# Patient Record
Sex: Female | Born: 1940 | Race: White | Hispanic: No | Marital: Married | Smoking: Former smoker
Health system: Southern US, Community
[De-identification: ages and names within clinical notes are randomized; demographics above are authoritative.]

## PROBLEM LIST (undated history)

## (undated) DIAGNOSIS — I219 Acute myocardial infarction, unspecified: Secondary | ICD-10-CM

## (undated) DIAGNOSIS — I639 Cerebral infarction, unspecified: Secondary | ICD-10-CM

## (undated) DIAGNOSIS — K219 Gastro-esophageal reflux disease without esophagitis: Secondary | ICD-10-CM

## (undated) DIAGNOSIS — E119 Type 2 diabetes mellitus without complications: Secondary | ICD-10-CM

## (undated) DIAGNOSIS — Z9289 Personal history of other medical treatment: Secondary | ICD-10-CM

## (undated) DIAGNOSIS — N183 Chronic kidney disease, stage 3 unspecified: Secondary | ICD-10-CM

## (undated) DIAGNOSIS — D649 Anemia, unspecified: Secondary | ICD-10-CM

## (undated) DIAGNOSIS — I5020 Unspecified systolic (congestive) heart failure: Secondary | ICD-10-CM

## (undated) DIAGNOSIS — E785 Hyperlipidemia, unspecified: Secondary | ICD-10-CM

## (undated) DIAGNOSIS — E079 Disorder of thyroid, unspecified: Secondary | ICD-10-CM

## (undated) DIAGNOSIS — I110 Hypertensive heart disease with heart failure: Secondary | ICD-10-CM

## (undated) DIAGNOSIS — I4891 Unspecified atrial fibrillation: Secondary | ICD-10-CM

## (undated) DIAGNOSIS — K5790 Diverticulosis of intestine, part unspecified, without perforation or abscess without bleeding: Secondary | ICD-10-CM

## (undated) DIAGNOSIS — I5022 Chronic systolic (congestive) heart failure: Secondary | ICD-10-CM

## (undated) DIAGNOSIS — Z9989 Dependence on other enabling machines and devices: Secondary | ICD-10-CM

## (undated) DIAGNOSIS — G9341 Metabolic encephalopathy: Secondary | ICD-10-CM

## (undated) DIAGNOSIS — G4733 Obstructive sleep apnea (adult) (pediatric): Secondary | ICD-10-CM

## (undated) DIAGNOSIS — I5023 Acute on chronic systolic (congestive) heart failure: Secondary | ICD-10-CM

## (undated) HISTORY — DX: Hypertensive heart disease with heart failure: I11.0

## (undated) HISTORY — DX: Obstructive sleep apnea (adult) (pediatric): G47.33

## (undated) HISTORY — DX: Diverticulosis of intestine, part unspecified, without perforation or abscess without bleeding: K57.90

## (undated) HISTORY — DX: Acute on chronic systolic (congestive) heart failure: I50.23

## (undated) HISTORY — PX: EXCISIONAL HEMORRHOIDECTOMY: SHX1541

## (undated) HISTORY — PX: TEE WITH CARDIOVERSION: SHX5442

## (undated) HISTORY — DX: Personal history of other medical treatment: Z92.89

## (undated) HISTORY — DX: Anemia, unspecified: D64.9

## (undated) HISTORY — DX: Hyperlipidemia, unspecified: E78.5

## (undated) HISTORY — DX: Disorder of thyroid, unspecified: E07.9

## (undated) HISTORY — DX: Unspecified atrial fibrillation: I48.91

## (undated) HISTORY — DX: Chronic kidney disease, stage 3 unspecified: N18.30

## (undated) HISTORY — PX: CARDIAC CATHETERIZATION: SHX172

## (undated) HISTORY — DX: Cerebral infarction, unspecified: I63.9

## (undated) HISTORY — DX: Acute myocardial infarction, unspecified: I21.9

## (undated) HISTORY — DX: Chronic kidney disease, stage 3 (moderate): N18.3

## (undated) HISTORY — DX: Unspecified systolic (congestive) heart failure: I50.20

## (undated) HISTORY — DX: Chronic systolic (congestive) heart failure: I50.22

## (undated) HISTORY — DX: Dependence on other enabling machines and devices: Z99.89

## (undated) HISTORY — PX: NOSE SURGERY: SHX723

## (undated) HISTORY — DX: Type 2 diabetes mellitus without complications: E11.9

## (undated) HISTORY — DX: Gastro-esophageal reflux disease without esophagitis: K21.9

## (undated) HISTORY — DX: Metabolic encephalopathy: G93.41

---

## 2014-09-17 DIAGNOSIS — I4891 Unspecified atrial fibrillation: Secondary | ICD-10-CM | POA: Insufficient documentation

## 2014-09-17 DIAGNOSIS — G4733 Obstructive sleep apnea (adult) (pediatric): Secondary | ICD-10-CM

## 2014-09-17 DIAGNOSIS — R001 Bradycardia, unspecified: Secondary | ICD-10-CM

## 2014-09-17 DIAGNOSIS — E785 Hyperlipidemia, unspecified: Secondary | ICD-10-CM | POA: Insufficient documentation

## 2014-09-17 HISTORY — DX: Bradycardia, unspecified: R00.1

## 2014-09-17 HISTORY — DX: Hyperlipidemia, unspecified: E78.5

## 2014-09-17 HISTORY — DX: Obstructive sleep apnea (adult) (pediatric): G47.33

## 2014-11-21 ENCOUNTER — Encounter: Payer: Self-pay | Admitting: Gastroenterology

## 2014-11-21 HISTORY — PX: COLONOSCOPY: SHX174

## 2015-03-14 DIAGNOSIS — J9811 Atelectasis: Secondary | ICD-10-CM | POA: Diagnosis not present

## 2015-03-14 DIAGNOSIS — E119 Type 2 diabetes mellitus without complications: Secondary | ICD-10-CM | POA: Diagnosis not present

## 2015-03-14 DIAGNOSIS — J90 Pleural effusion, not elsewhere classified: Secondary | ICD-10-CM | POA: Insufficient documentation

## 2015-03-14 DIAGNOSIS — I10 Essential (primary) hypertension: Secondary | ICD-10-CM | POA: Diagnosis not present

## 2015-03-14 DIAGNOSIS — I480 Paroxysmal atrial fibrillation: Secondary | ICD-10-CM | POA: Diagnosis not present

## 2015-03-14 DIAGNOSIS — G4733 Obstructive sleep apnea (adult) (pediatric): Secondary | ICD-10-CM | POA: Diagnosis not present

## 2015-03-14 HISTORY — DX: Pleural effusion, not elsewhere classified: J90

## 2015-03-19 DIAGNOSIS — J90 Pleural effusion, not elsewhere classified: Secondary | ICD-10-CM | POA: Diagnosis not present

## 2015-03-19 DIAGNOSIS — J948 Other specified pleural conditions: Secondary | ICD-10-CM | POA: Diagnosis not present

## 2015-04-01 DIAGNOSIS — I517 Cardiomegaly: Secondary | ICD-10-CM | POA: Diagnosis not present

## 2015-04-01 DIAGNOSIS — R918 Other nonspecific abnormal finding of lung field: Secondary | ICD-10-CM | POA: Diagnosis not present

## 2015-04-01 DIAGNOSIS — J9811 Atelectasis: Secondary | ICD-10-CM | POA: Diagnosis not present

## 2015-04-01 DIAGNOSIS — J90 Pleural effusion, not elsewhere classified: Secondary | ICD-10-CM | POA: Diagnosis not present

## 2015-04-01 DIAGNOSIS — N183 Chronic kidney disease, stage 3 (moderate): Secondary | ICD-10-CM | POA: Diagnosis not present

## 2015-04-04 DIAGNOSIS — I129 Hypertensive chronic kidney disease with stage 1 through stage 4 chronic kidney disease, or unspecified chronic kidney disease: Secondary | ICD-10-CM | POA: Insufficient documentation

## 2015-04-04 DIAGNOSIS — N184 Chronic kidney disease, stage 4 (severe): Secondary | ICD-10-CM | POA: Insufficient documentation

## 2015-04-04 DIAGNOSIS — M9080 Osteopathy in diseases classified elsewhere, unspecified site: Secondary | ICD-10-CM | POA: Diagnosis not present

## 2015-04-04 DIAGNOSIS — E559 Vitamin D deficiency, unspecified: Secondary | ICD-10-CM | POA: Insufficient documentation

## 2015-04-04 DIAGNOSIS — E1122 Type 2 diabetes mellitus with diabetic chronic kidney disease: Secondary | ICD-10-CM

## 2015-04-04 DIAGNOSIS — D631 Anemia in chronic kidney disease: Secondary | ICD-10-CM

## 2015-04-04 DIAGNOSIS — N183 Chronic kidney disease, stage 3 (moderate): Secondary | ICD-10-CM | POA: Diagnosis not present

## 2015-04-04 DIAGNOSIS — E889 Metabolic disorder, unspecified: Secondary | ICD-10-CM | POA: Diagnosis not present

## 2015-04-04 DIAGNOSIS — N179 Acute kidney failure, unspecified: Secondary | ICD-10-CM | POA: Diagnosis not present

## 2015-04-04 HISTORY — DX: Type 2 diabetes mellitus with diabetic chronic kidney disease: N18.4

## 2015-04-04 HISTORY — DX: Hypercalcemia: E83.52

## 2015-04-04 HISTORY — DX: Anemia in chronic kidney disease: D63.1

## 2015-04-04 HISTORY — DX: Vitamin D deficiency, unspecified: E55.9

## 2015-04-04 HISTORY — DX: Hypertensive chronic kidney disease with stage 1 through stage 4 chronic kidney disease, or unspecified chronic kidney disease: I12.9

## 2015-04-04 HISTORY — DX: Type 2 diabetes mellitus with diabetic chronic kidney disease: E11.22

## 2015-04-08 DIAGNOSIS — I4891 Unspecified atrial fibrillation: Secondary | ICD-10-CM | POA: Diagnosis not present

## 2015-04-10 DIAGNOSIS — I481 Persistent atrial fibrillation: Secondary | ICD-10-CM | POA: Diagnosis not present

## 2015-04-10 DIAGNOSIS — G4733 Obstructive sleep apnea (adult) (pediatric): Secondary | ICD-10-CM | POA: Diagnosis not present

## 2015-04-10 DIAGNOSIS — E119 Type 2 diabetes mellitus without complications: Secondary | ICD-10-CM | POA: Diagnosis not present

## 2015-04-10 DIAGNOSIS — I480 Paroxysmal atrial fibrillation: Secondary | ICD-10-CM | POA: Diagnosis not present

## 2015-04-10 DIAGNOSIS — E039 Hypothyroidism, unspecified: Secondary | ICD-10-CM | POA: Diagnosis not present

## 2015-04-10 DIAGNOSIS — E785 Hyperlipidemia, unspecified: Secondary | ICD-10-CM | POA: Diagnosis not present

## 2015-04-10 DIAGNOSIS — Z794 Long term (current) use of insulin: Secondary | ICD-10-CM | POA: Diagnosis not present

## 2015-04-10 DIAGNOSIS — I5030 Unspecified diastolic (congestive) heart failure: Secondary | ICD-10-CM | POA: Diagnosis not present

## 2015-04-10 DIAGNOSIS — N183 Chronic kidney disease, stage 3 (moderate): Secondary | ICD-10-CM | POA: Diagnosis not present

## 2015-04-10 DIAGNOSIS — K219 Gastro-esophageal reflux disease without esophagitis: Secondary | ICD-10-CM | POA: Diagnosis not present

## 2015-04-10 DIAGNOSIS — I130 Hypertensive heart and chronic kidney disease with heart failure and stage 1 through stage 4 chronic kidney disease, or unspecified chronic kidney disease: Secondary | ICD-10-CM | POA: Diagnosis not present

## 2015-04-15 DIAGNOSIS — G4733 Obstructive sleep apnea (adult) (pediatric): Secondary | ICD-10-CM | POA: Diagnosis not present

## 2015-04-17 DIAGNOSIS — I4891 Unspecified atrial fibrillation: Secondary | ICD-10-CM | POA: Diagnosis not present

## 2015-05-08 DIAGNOSIS — E782 Mixed hyperlipidemia: Secondary | ICD-10-CM | POA: Diagnosis not present

## 2015-05-08 DIAGNOSIS — Z1389 Encounter for screening for other disorder: Secondary | ICD-10-CM | POA: Diagnosis not present

## 2015-05-08 DIAGNOSIS — I119 Hypertensive heart disease without heart failure: Secondary | ICD-10-CM | POA: Diagnosis not present

## 2015-05-08 DIAGNOSIS — E039 Hypothyroidism, unspecified: Secondary | ICD-10-CM | POA: Diagnosis not present

## 2015-05-08 DIAGNOSIS — E1165 Type 2 diabetes mellitus with hyperglycemia: Secondary | ICD-10-CM | POA: Diagnosis not present

## 2015-05-08 DIAGNOSIS — E1129 Type 2 diabetes mellitus with other diabetic kidney complication: Secondary | ICD-10-CM | POA: Diagnosis not present

## 2015-05-08 DIAGNOSIS — N183 Chronic kidney disease, stage 3 (moderate): Secondary | ICD-10-CM | POA: Diagnosis not present

## 2015-05-08 DIAGNOSIS — M65331 Trigger finger, right middle finger: Secondary | ICD-10-CM | POA: Diagnosis not present

## 2015-05-09 DIAGNOSIS — I10 Essential (primary) hypertension: Secondary | ICD-10-CM | POA: Diagnosis not present

## 2015-05-09 DIAGNOSIS — E785 Hyperlipidemia, unspecified: Secondary | ICD-10-CM | POA: Diagnosis not present

## 2015-05-09 DIAGNOSIS — D649 Anemia, unspecified: Secondary | ICD-10-CM | POA: Diagnosis not present

## 2015-05-09 DIAGNOSIS — Z794 Long term (current) use of insulin: Secondary | ICD-10-CM | POA: Diagnosis not present

## 2015-05-09 DIAGNOSIS — I480 Paroxysmal atrial fibrillation: Secondary | ICD-10-CM | POA: Diagnosis not present

## 2015-05-09 DIAGNOSIS — E119 Type 2 diabetes mellitus without complications: Secondary | ICD-10-CM | POA: Diagnosis not present

## 2015-05-09 DIAGNOSIS — R001 Bradycardia, unspecified: Secondary | ICD-10-CM | POA: Diagnosis not present

## 2015-05-15 DIAGNOSIS — M65331 Trigger finger, right middle finger: Secondary | ICD-10-CM | POA: Diagnosis not present

## 2015-05-23 DIAGNOSIS — Z794 Long term (current) use of insulin: Secondary | ICD-10-CM | POA: Diagnosis not present

## 2015-05-23 DIAGNOSIS — R001 Bradycardia, unspecified: Secondary | ICD-10-CM | POA: Diagnosis not present

## 2015-05-23 DIAGNOSIS — Z7901 Long term (current) use of anticoagulants: Secondary | ICD-10-CM | POA: Diagnosis not present

## 2015-05-23 DIAGNOSIS — I480 Paroxysmal atrial fibrillation: Secondary | ICD-10-CM | POA: Diagnosis not present

## 2015-05-23 DIAGNOSIS — E119 Type 2 diabetes mellitus without complications: Secondary | ICD-10-CM | POA: Diagnosis not present

## 2015-05-23 DIAGNOSIS — I10 Essential (primary) hypertension: Secondary | ICD-10-CM | POA: Diagnosis not present

## 2015-05-23 DIAGNOSIS — E785 Hyperlipidemia, unspecified: Secondary | ICD-10-CM | POA: Diagnosis not present

## 2015-05-31 DIAGNOSIS — N183 Chronic kidney disease, stage 3 (moderate): Secondary | ICD-10-CM | POA: Diagnosis not present

## 2015-06-04 DIAGNOSIS — E559 Vitamin D deficiency, unspecified: Secondary | ICD-10-CM | POA: Diagnosis not present

## 2015-06-04 DIAGNOSIS — N183 Chronic kidney disease, stage 3 (moderate): Secondary | ICD-10-CM | POA: Diagnosis not present

## 2015-06-04 DIAGNOSIS — I129 Hypertensive chronic kidney disease with stage 1 through stage 4 chronic kidney disease, or unspecified chronic kidney disease: Secondary | ICD-10-CM | POA: Diagnosis not present

## 2015-06-04 DIAGNOSIS — M9080 Osteopathy in diseases classified elsewhere, unspecified site: Secondary | ICD-10-CM | POA: Diagnosis not present

## 2015-06-04 DIAGNOSIS — E889 Metabolic disorder, unspecified: Secondary | ICD-10-CM | POA: Diagnosis not present

## 2015-06-04 DIAGNOSIS — E1122 Type 2 diabetes mellitus with diabetic chronic kidney disease: Secondary | ICD-10-CM | POA: Diagnosis not present

## 2015-06-12 DIAGNOSIS — E119 Type 2 diabetes mellitus without complications: Secondary | ICD-10-CM | POA: Diagnosis not present

## 2015-06-12 DIAGNOSIS — I10 Essential (primary) hypertension: Secondary | ICD-10-CM | POA: Diagnosis not present

## 2015-06-12 DIAGNOSIS — E1122 Type 2 diabetes mellitus with diabetic chronic kidney disease: Secondary | ICD-10-CM | POA: Diagnosis not present

## 2015-06-12 DIAGNOSIS — N183 Chronic kidney disease, stage 3 (moderate): Secondary | ICD-10-CM | POA: Diagnosis not present

## 2015-06-12 DIAGNOSIS — I4891 Unspecified atrial fibrillation: Secondary | ICD-10-CM | POA: Diagnosis not present

## 2015-06-12 DIAGNOSIS — G4733 Obstructive sleep apnea (adult) (pediatric): Secondary | ICD-10-CM | POA: Diagnosis not present

## 2015-06-12 DIAGNOSIS — I480 Paroxysmal atrial fibrillation: Secondary | ICD-10-CM | POA: Diagnosis not present

## 2015-06-12 DIAGNOSIS — Z794 Long term (current) use of insulin: Secondary | ICD-10-CM | POA: Diagnosis not present

## 2015-06-14 DIAGNOSIS — M65331 Trigger finger, right middle finger: Secondary | ICD-10-CM | POA: Diagnosis not present

## 2015-06-14 DIAGNOSIS — M1612 Unilateral primary osteoarthritis, left hip: Secondary | ICD-10-CM | POA: Diagnosis not present

## 2015-06-18 DIAGNOSIS — R001 Bradycardia, unspecified: Secondary | ICD-10-CM | POA: Diagnosis not present

## 2015-06-18 DIAGNOSIS — E119 Type 2 diabetes mellitus without complications: Secondary | ICD-10-CM | POA: Diagnosis not present

## 2015-06-18 DIAGNOSIS — I10 Essential (primary) hypertension: Secondary | ICD-10-CM | POA: Diagnosis not present

## 2015-06-18 DIAGNOSIS — E785 Hyperlipidemia, unspecified: Secondary | ICD-10-CM | POA: Diagnosis not present

## 2015-06-18 DIAGNOSIS — I480 Paroxysmal atrial fibrillation: Secondary | ICD-10-CM | POA: Diagnosis not present

## 2015-06-18 DIAGNOSIS — Z794 Long term (current) use of insulin: Secondary | ICD-10-CM | POA: Diagnosis not present

## 2015-06-19 DIAGNOSIS — M1612 Unilateral primary osteoarthritis, left hip: Secondary | ICD-10-CM | POA: Diagnosis not present

## 2015-06-19 DIAGNOSIS — G4733 Obstructive sleep apnea (adult) (pediatric): Secondary | ICD-10-CM | POA: Diagnosis not present

## 2015-07-02 DIAGNOSIS — H2511 Age-related nuclear cataract, right eye: Secondary | ICD-10-CM | POA: Diagnosis not present

## 2015-07-02 DIAGNOSIS — H02839 Dermatochalasis of unspecified eye, unspecified eyelid: Secondary | ICD-10-CM | POA: Diagnosis not present

## 2015-07-02 DIAGNOSIS — H353130 Nonexudative age-related macular degeneration, bilateral, stage unspecified: Secondary | ICD-10-CM | POA: Diagnosis not present

## 2015-07-02 DIAGNOSIS — H18411 Arcus senilis, right eye: Secondary | ICD-10-CM | POA: Diagnosis not present

## 2015-07-02 DIAGNOSIS — H2512 Age-related nuclear cataract, left eye: Secondary | ICD-10-CM | POA: Diagnosis not present

## 2015-07-04 DIAGNOSIS — N905 Atrophy of vulva: Secondary | ICD-10-CM | POA: Diagnosis not present

## 2015-07-11 DIAGNOSIS — M7062 Trochanteric bursitis, left hip: Secondary | ICD-10-CM | POA: Diagnosis not present

## 2015-07-11 DIAGNOSIS — M1612 Unilateral primary osteoarthritis, left hip: Secondary | ICD-10-CM | POA: Diagnosis not present

## 2015-07-11 DIAGNOSIS — M65331 Trigger finger, right middle finger: Secondary | ICD-10-CM | POA: Diagnosis not present

## 2015-08-08 DIAGNOSIS — R079 Chest pain, unspecified: Secondary | ICD-10-CM | POA: Diagnosis not present

## 2015-08-08 DIAGNOSIS — N183 Chronic kidney disease, stage 3 (moderate): Secondary | ICD-10-CM | POA: Diagnosis not present

## 2015-08-08 DIAGNOSIS — I10 Essential (primary) hypertension: Secondary | ICD-10-CM | POA: Diagnosis not present

## 2015-08-08 DIAGNOSIS — I480 Paroxysmal atrial fibrillation: Secondary | ICD-10-CM | POA: Diagnosis not present

## 2015-08-08 DIAGNOSIS — E785 Hyperlipidemia, unspecified: Secondary | ICD-10-CM | POA: Diagnosis not present

## 2015-08-08 DIAGNOSIS — E1122 Type 2 diabetes mellitus with diabetic chronic kidney disease: Secondary | ICD-10-CM | POA: Diagnosis not present

## 2015-08-08 DIAGNOSIS — E119 Type 2 diabetes mellitus without complications: Secondary | ICD-10-CM | POA: Diagnosis not present

## 2015-08-08 DIAGNOSIS — Z794 Long term (current) use of insulin: Secondary | ICD-10-CM | POA: Diagnosis not present

## 2015-08-09 DIAGNOSIS — M1612 Unilateral primary osteoarthritis, left hip: Secondary | ICD-10-CM | POA: Diagnosis not present

## 2015-08-09 DIAGNOSIS — M7062 Trochanteric bursitis, left hip: Secondary | ICD-10-CM | POA: Diagnosis not present

## 2015-08-09 DIAGNOSIS — M65331 Trigger finger, right middle finger: Secondary | ICD-10-CM | POA: Diagnosis not present

## 2015-08-14 DIAGNOSIS — R079 Chest pain, unspecified: Secondary | ICD-10-CM | POA: Diagnosis not present

## 2015-08-20 DIAGNOSIS — M25552 Pain in left hip: Secondary | ICD-10-CM | POA: Diagnosis not present

## 2015-08-20 DIAGNOSIS — M5489 Other dorsalgia: Secondary | ICD-10-CM | POA: Diagnosis not present

## 2015-08-20 DIAGNOSIS — M25652 Stiffness of left hip, not elsewhere classified: Secondary | ICD-10-CM | POA: Diagnosis not present

## 2015-08-26 DIAGNOSIS — M25552 Pain in left hip: Secondary | ICD-10-CM | POA: Diagnosis not present

## 2015-08-26 DIAGNOSIS — M25652 Stiffness of left hip, not elsewhere classified: Secondary | ICD-10-CM | POA: Diagnosis not present

## 2015-08-26 DIAGNOSIS — M5489 Other dorsalgia: Secondary | ICD-10-CM | POA: Diagnosis not present

## 2015-08-28 DIAGNOSIS — M25552 Pain in left hip: Secondary | ICD-10-CM | POA: Diagnosis not present

## 2015-08-28 DIAGNOSIS — M5489 Other dorsalgia: Secondary | ICD-10-CM | POA: Diagnosis not present

## 2015-08-28 DIAGNOSIS — M25652 Stiffness of left hip, not elsewhere classified: Secondary | ICD-10-CM | POA: Diagnosis not present

## 2015-08-29 DIAGNOSIS — N183 Chronic kidney disease, stage 3 (moderate): Secondary | ICD-10-CM | POA: Diagnosis not present

## 2015-08-29 DIAGNOSIS — I129 Hypertensive chronic kidney disease with stage 1 through stage 4 chronic kidney disease, or unspecified chronic kidney disease: Secondary | ICD-10-CM | POA: Diagnosis not present

## 2015-08-29 DIAGNOSIS — M5489 Other dorsalgia: Secondary | ICD-10-CM | POA: Diagnosis not present

## 2015-08-29 DIAGNOSIS — M9080 Osteopathy in diseases classified elsewhere, unspecified site: Secondary | ICD-10-CM | POA: Diagnosis not present

## 2015-08-29 DIAGNOSIS — M25552 Pain in left hip: Secondary | ICD-10-CM | POA: Diagnosis not present

## 2015-08-29 DIAGNOSIS — M25652 Stiffness of left hip, not elsewhere classified: Secondary | ICD-10-CM | POA: Diagnosis not present

## 2015-08-29 DIAGNOSIS — E889 Metabolic disorder, unspecified: Secondary | ICD-10-CM | POA: Diagnosis not present

## 2015-08-29 DIAGNOSIS — E559 Vitamin D deficiency, unspecified: Secondary | ICD-10-CM | POA: Diagnosis not present

## 2015-08-29 DIAGNOSIS — D631 Anemia in chronic kidney disease: Secondary | ICD-10-CM | POA: Diagnosis not present

## 2015-09-02 DIAGNOSIS — M25652 Stiffness of left hip, not elsewhere classified: Secondary | ICD-10-CM | POA: Diagnosis not present

## 2015-09-02 DIAGNOSIS — M5489 Other dorsalgia: Secondary | ICD-10-CM | POA: Diagnosis not present

## 2015-09-02 DIAGNOSIS — M25552 Pain in left hip: Secondary | ICD-10-CM | POA: Diagnosis not present

## 2015-09-05 DIAGNOSIS — N183 Chronic kidney disease, stage 3 (moderate): Secondary | ICD-10-CM | POA: Diagnosis not present

## 2015-09-05 DIAGNOSIS — M25652 Stiffness of left hip, not elsewhere classified: Secondary | ICD-10-CM | POA: Diagnosis not present

## 2015-09-05 DIAGNOSIS — E559 Vitamin D deficiency, unspecified: Secondary | ICD-10-CM | POA: Diagnosis not present

## 2015-09-05 DIAGNOSIS — M5489 Other dorsalgia: Secondary | ICD-10-CM | POA: Diagnosis not present

## 2015-09-05 DIAGNOSIS — E889 Metabolic disorder, unspecified: Secondary | ICD-10-CM | POA: Diagnosis not present

## 2015-09-05 DIAGNOSIS — M25552 Pain in left hip: Secondary | ICD-10-CM | POA: Diagnosis not present

## 2015-09-05 DIAGNOSIS — I129 Hypertensive chronic kidney disease with stage 1 through stage 4 chronic kidney disease, or unspecified chronic kidney disease: Secondary | ICD-10-CM | POA: Diagnosis not present

## 2015-09-05 DIAGNOSIS — M9080 Osteopathy in diseases classified elsewhere, unspecified site: Secondary | ICD-10-CM | POA: Diagnosis not present

## 2015-09-05 DIAGNOSIS — D631 Anemia in chronic kidney disease: Secondary | ICD-10-CM | POA: Diagnosis not present

## 2015-09-05 DIAGNOSIS — E1122 Type 2 diabetes mellitus with diabetic chronic kidney disease: Secondary | ICD-10-CM | POA: Diagnosis not present

## 2015-09-09 DIAGNOSIS — M7062 Trochanteric bursitis, left hip: Secondary | ICD-10-CM | POA: Diagnosis not present

## 2015-09-09 DIAGNOSIS — M519 Unspecified thoracic, thoracolumbar and lumbosacral intervertebral disc disorder: Secondary | ICD-10-CM | POA: Diagnosis not present

## 2015-09-09 DIAGNOSIS — M1612 Unilateral primary osteoarthritis, left hip: Secondary | ICD-10-CM | POA: Diagnosis not present

## 2015-09-09 DIAGNOSIS — M65331 Trigger finger, right middle finger: Secondary | ICD-10-CM | POA: Diagnosis not present

## 2015-09-10 DIAGNOSIS — M25652 Stiffness of left hip, not elsewhere classified: Secondary | ICD-10-CM | POA: Diagnosis not present

## 2015-09-10 DIAGNOSIS — M5489 Other dorsalgia: Secondary | ICD-10-CM | POA: Diagnosis not present

## 2015-09-10 DIAGNOSIS — M25552 Pain in left hip: Secondary | ICD-10-CM | POA: Diagnosis not present

## 2015-09-12 DIAGNOSIS — M25552 Pain in left hip: Secondary | ICD-10-CM | POA: Diagnosis not present

## 2015-09-12 DIAGNOSIS — M25652 Stiffness of left hip, not elsewhere classified: Secondary | ICD-10-CM | POA: Diagnosis not present

## 2015-09-12 DIAGNOSIS — M5489 Other dorsalgia: Secondary | ICD-10-CM | POA: Diagnosis not present

## 2015-09-13 DIAGNOSIS — M4806 Spinal stenosis, lumbar region: Secondary | ICD-10-CM | POA: Diagnosis not present

## 2015-09-13 DIAGNOSIS — M5416 Radiculopathy, lumbar region: Secondary | ICD-10-CM | POA: Diagnosis not present

## 2015-09-17 DIAGNOSIS — M5489 Other dorsalgia: Secondary | ICD-10-CM | POA: Diagnosis not present

## 2015-09-17 DIAGNOSIS — M25552 Pain in left hip: Secondary | ICD-10-CM | POA: Diagnosis not present

## 2015-09-17 DIAGNOSIS — M25652 Stiffness of left hip, not elsewhere classified: Secondary | ICD-10-CM | POA: Diagnosis not present

## 2015-09-18 DIAGNOSIS — M1612 Unilateral primary osteoarthritis, left hip: Secondary | ICD-10-CM | POA: Diagnosis not present

## 2015-09-18 DIAGNOSIS — M7062 Trochanteric bursitis, left hip: Secondary | ICD-10-CM | POA: Diagnosis not present

## 2015-09-20 DIAGNOSIS — M25552 Pain in left hip: Secondary | ICD-10-CM | POA: Diagnosis not present

## 2015-09-20 DIAGNOSIS — G4733 Obstructive sleep apnea (adult) (pediatric): Secondary | ICD-10-CM | POA: Diagnosis not present

## 2015-09-20 DIAGNOSIS — M5489 Other dorsalgia: Secondary | ICD-10-CM | POA: Diagnosis not present

## 2015-09-20 DIAGNOSIS — M25652 Stiffness of left hip, not elsewhere classified: Secondary | ICD-10-CM | POA: Diagnosis not present

## 2015-09-24 DIAGNOSIS — M5489 Other dorsalgia: Secondary | ICD-10-CM | POA: Diagnosis not present

## 2015-09-24 DIAGNOSIS — M25552 Pain in left hip: Secondary | ICD-10-CM | POA: Diagnosis not present

## 2015-09-24 DIAGNOSIS — M25652 Stiffness of left hip, not elsewhere classified: Secondary | ICD-10-CM | POA: Diagnosis not present

## 2015-09-25 DIAGNOSIS — M25552 Pain in left hip: Secondary | ICD-10-CM | POA: Diagnosis not present

## 2015-09-25 DIAGNOSIS — M5489 Other dorsalgia: Secondary | ICD-10-CM | POA: Diagnosis not present

## 2015-09-25 DIAGNOSIS — M25652 Stiffness of left hip, not elsewhere classified: Secondary | ICD-10-CM | POA: Diagnosis not present

## 2015-09-26 DIAGNOSIS — M47816 Spondylosis without myelopathy or radiculopathy, lumbar region: Secondary | ICD-10-CM | POA: Diagnosis not present

## 2015-09-26 DIAGNOSIS — M4806 Spinal stenosis, lumbar region: Secondary | ICD-10-CM | POA: Diagnosis not present

## 2015-09-27 DIAGNOSIS — E785 Hyperlipidemia, unspecified: Secondary | ICD-10-CM | POA: Diagnosis not present

## 2015-09-27 DIAGNOSIS — R001 Bradycardia, unspecified: Secondary | ICD-10-CM | POA: Diagnosis not present

## 2015-09-27 DIAGNOSIS — I10 Essential (primary) hypertension: Secondary | ICD-10-CM | POA: Diagnosis not present

## 2015-09-27 DIAGNOSIS — E119 Type 2 diabetes mellitus without complications: Secondary | ICD-10-CM | POA: Diagnosis not present

## 2015-09-27 DIAGNOSIS — Z794 Long term (current) use of insulin: Secondary | ICD-10-CM | POA: Diagnosis not present

## 2015-09-27 DIAGNOSIS — I480 Paroxysmal atrial fibrillation: Secondary | ICD-10-CM | POA: Diagnosis not present

## 2015-09-27 DIAGNOSIS — G4733 Obstructive sleep apnea (adult) (pediatric): Secondary | ICD-10-CM | POA: Diagnosis not present

## 2015-10-01 DIAGNOSIS — M25552 Pain in left hip: Secondary | ICD-10-CM | POA: Diagnosis not present

## 2015-10-01 DIAGNOSIS — M5489 Other dorsalgia: Secondary | ICD-10-CM | POA: Diagnosis not present

## 2015-10-01 DIAGNOSIS — M25652 Stiffness of left hip, not elsewhere classified: Secondary | ICD-10-CM | POA: Diagnosis not present

## 2015-10-03 DIAGNOSIS — M25652 Stiffness of left hip, not elsewhere classified: Secondary | ICD-10-CM | POA: Diagnosis not present

## 2015-10-03 DIAGNOSIS — M25552 Pain in left hip: Secondary | ICD-10-CM | POA: Diagnosis not present

## 2015-10-03 DIAGNOSIS — M5489 Other dorsalgia: Secondary | ICD-10-CM | POA: Diagnosis not present

## 2015-10-08 DIAGNOSIS — M5489 Other dorsalgia: Secondary | ICD-10-CM | POA: Diagnosis not present

## 2015-10-08 DIAGNOSIS — M25552 Pain in left hip: Secondary | ICD-10-CM | POA: Diagnosis not present

## 2015-10-08 DIAGNOSIS — M25652 Stiffness of left hip, not elsewhere classified: Secondary | ICD-10-CM | POA: Diagnosis not present

## 2015-10-10 DIAGNOSIS — M25652 Stiffness of left hip, not elsewhere classified: Secondary | ICD-10-CM | POA: Diagnosis not present

## 2015-10-10 DIAGNOSIS — M25552 Pain in left hip: Secondary | ICD-10-CM | POA: Diagnosis not present

## 2015-10-10 DIAGNOSIS — M5489 Other dorsalgia: Secondary | ICD-10-CM | POA: Diagnosis not present

## 2015-10-15 DIAGNOSIS — M25652 Stiffness of left hip, not elsewhere classified: Secondary | ICD-10-CM | POA: Diagnosis not present

## 2015-10-15 DIAGNOSIS — M25552 Pain in left hip: Secondary | ICD-10-CM | POA: Diagnosis not present

## 2015-10-15 DIAGNOSIS — M5489 Other dorsalgia: Secondary | ICD-10-CM | POA: Diagnosis not present

## 2015-10-18 DIAGNOSIS — M25652 Stiffness of left hip, not elsewhere classified: Secondary | ICD-10-CM | POA: Diagnosis not present

## 2015-10-18 DIAGNOSIS — M25552 Pain in left hip: Secondary | ICD-10-CM | POA: Diagnosis not present

## 2015-10-18 DIAGNOSIS — M5489 Other dorsalgia: Secondary | ICD-10-CM | POA: Diagnosis not present

## 2015-10-22 DIAGNOSIS — M25652 Stiffness of left hip, not elsewhere classified: Secondary | ICD-10-CM | POA: Diagnosis not present

## 2015-10-22 DIAGNOSIS — M25552 Pain in left hip: Secondary | ICD-10-CM | POA: Diagnosis not present

## 2015-10-22 DIAGNOSIS — M5489 Other dorsalgia: Secondary | ICD-10-CM | POA: Diagnosis not present

## 2015-10-24 DIAGNOSIS — M25552 Pain in left hip: Secondary | ICD-10-CM | POA: Diagnosis not present

## 2015-10-24 DIAGNOSIS — M25652 Stiffness of left hip, not elsewhere classified: Secondary | ICD-10-CM | POA: Diagnosis not present

## 2015-10-24 DIAGNOSIS — M5489 Other dorsalgia: Secondary | ICD-10-CM | POA: Diagnosis not present

## 2015-10-28 DIAGNOSIS — Z1231 Encounter for screening mammogram for malignant neoplasm of breast: Secondary | ICD-10-CM | POA: Diagnosis not present

## 2015-10-29 DIAGNOSIS — M25552 Pain in left hip: Secondary | ICD-10-CM | POA: Diagnosis not present

## 2015-10-29 DIAGNOSIS — M5489 Other dorsalgia: Secondary | ICD-10-CM | POA: Diagnosis not present

## 2015-10-29 DIAGNOSIS — M25652 Stiffness of left hip, not elsewhere classified: Secondary | ICD-10-CM | POA: Diagnosis not present

## 2015-10-31 DIAGNOSIS — M5489 Other dorsalgia: Secondary | ICD-10-CM | POA: Diagnosis not present

## 2015-10-31 DIAGNOSIS — M25552 Pain in left hip: Secondary | ICD-10-CM | POA: Diagnosis not present

## 2015-10-31 DIAGNOSIS — M25652 Stiffness of left hip, not elsewhere classified: Secondary | ICD-10-CM | POA: Diagnosis not present

## 2015-10-31 DIAGNOSIS — E119 Type 2 diabetes mellitus without complications: Secondary | ICD-10-CM | POA: Diagnosis not present

## 2015-11-04 DIAGNOSIS — M5489 Other dorsalgia: Secondary | ICD-10-CM | POA: Diagnosis not present

## 2015-11-04 DIAGNOSIS — M25652 Stiffness of left hip, not elsewhere classified: Secondary | ICD-10-CM | POA: Diagnosis not present

## 2015-11-04 DIAGNOSIS — M25552 Pain in left hip: Secondary | ICD-10-CM | POA: Diagnosis not present

## 2015-11-05 DIAGNOSIS — Z23 Encounter for immunization: Secondary | ICD-10-CM | POA: Diagnosis not present

## 2015-11-06 DIAGNOSIS — M5489 Other dorsalgia: Secondary | ICD-10-CM | POA: Diagnosis not present

## 2015-11-06 DIAGNOSIS — M25552 Pain in left hip: Secondary | ICD-10-CM | POA: Diagnosis not present

## 2015-11-06 DIAGNOSIS — M25652 Stiffness of left hip, not elsewhere classified: Secondary | ICD-10-CM | POA: Diagnosis not present

## 2015-11-07 DIAGNOSIS — I482 Chronic atrial fibrillation: Secondary | ICD-10-CM | POA: Diagnosis not present

## 2015-11-07 DIAGNOSIS — N183 Chronic kidney disease, stage 3 (moderate): Secondary | ICD-10-CM | POA: Diagnosis not present

## 2015-11-07 DIAGNOSIS — I119 Hypertensive heart disease without heart failure: Secondary | ICD-10-CM | POA: Diagnosis not present

## 2015-11-07 DIAGNOSIS — E039 Hypothyroidism, unspecified: Secondary | ICD-10-CM | POA: Diagnosis not present

## 2015-11-07 DIAGNOSIS — E559 Vitamin D deficiency, unspecified: Secondary | ICD-10-CM | POA: Diagnosis not present

## 2015-11-07 DIAGNOSIS — E782 Mixed hyperlipidemia: Secondary | ICD-10-CM | POA: Diagnosis not present

## 2015-11-07 DIAGNOSIS — E1165 Type 2 diabetes mellitus with hyperglycemia: Secondary | ICD-10-CM | POA: Diagnosis not present

## 2015-11-07 DIAGNOSIS — Z0000 Encounter for general adult medical examination without abnormal findings: Secondary | ICD-10-CM | POA: Diagnosis not present

## 2015-11-07 DIAGNOSIS — E1129 Type 2 diabetes mellitus with other diabetic kidney complication: Secondary | ICD-10-CM | POA: Diagnosis not present

## 2015-11-11 DIAGNOSIS — M5489 Other dorsalgia: Secondary | ICD-10-CM | POA: Diagnosis not present

## 2015-11-11 DIAGNOSIS — M25652 Stiffness of left hip, not elsewhere classified: Secondary | ICD-10-CM | POA: Diagnosis not present

## 2015-11-11 DIAGNOSIS — M25552 Pain in left hip: Secondary | ICD-10-CM | POA: Diagnosis not present

## 2015-11-13 DIAGNOSIS — M25552 Pain in left hip: Secondary | ICD-10-CM | POA: Diagnosis not present

## 2015-11-13 DIAGNOSIS — M5489 Other dorsalgia: Secondary | ICD-10-CM | POA: Diagnosis not present

## 2015-11-13 DIAGNOSIS — M25652 Stiffness of left hip, not elsewhere classified: Secondary | ICD-10-CM | POA: Diagnosis not present

## 2015-11-27 DIAGNOSIS — R001 Bradycardia, unspecified: Secondary | ICD-10-CM | POA: Diagnosis not present

## 2015-11-27 DIAGNOSIS — I10 Essential (primary) hypertension: Secondary | ICD-10-CM | POA: Diagnosis not present

## 2015-11-27 DIAGNOSIS — I480 Paroxysmal atrial fibrillation: Secondary | ICD-10-CM | POA: Diagnosis not present

## 2015-11-27 DIAGNOSIS — Z794 Long term (current) use of insulin: Secondary | ICD-10-CM | POA: Diagnosis not present

## 2015-11-27 DIAGNOSIS — E119 Type 2 diabetes mellitus without complications: Secondary | ICD-10-CM | POA: Diagnosis not present

## 2015-11-27 DIAGNOSIS — G4733 Obstructive sleep apnea (adult) (pediatric): Secondary | ICD-10-CM | POA: Diagnosis not present

## 2015-12-06 DIAGNOSIS — N183 Chronic kidney disease, stage 3 (moderate): Secondary | ICD-10-CM | POA: Diagnosis not present

## 2015-12-10 DIAGNOSIS — E1122 Type 2 diabetes mellitus with diabetic chronic kidney disease: Secondary | ICD-10-CM | POA: Diagnosis not present

## 2015-12-10 DIAGNOSIS — N183 Chronic kidney disease, stage 3 (moderate): Secondary | ICD-10-CM | POA: Diagnosis not present

## 2015-12-10 DIAGNOSIS — E889 Metabolic disorder, unspecified: Secondary | ICD-10-CM | POA: Diagnosis not present

## 2015-12-10 DIAGNOSIS — E559 Vitamin D deficiency, unspecified: Secondary | ICD-10-CM | POA: Diagnosis not present

## 2015-12-10 DIAGNOSIS — I129 Hypertensive chronic kidney disease with stage 1 through stage 4 chronic kidney disease, or unspecified chronic kidney disease: Secondary | ICD-10-CM | POA: Diagnosis not present

## 2015-12-10 DIAGNOSIS — M9080 Osteopathy in diseases classified elsewhere, unspecified site: Secondary | ICD-10-CM | POA: Diagnosis not present

## 2015-12-10 DIAGNOSIS — D631 Anemia in chronic kidney disease: Secondary | ICD-10-CM | POA: Diagnosis not present

## 2015-12-13 DIAGNOSIS — M1612 Unilateral primary osteoarthritis, left hip: Secondary | ICD-10-CM | POA: Diagnosis not present

## 2015-12-13 DIAGNOSIS — M7062 Trochanteric bursitis, left hip: Secondary | ICD-10-CM | POA: Diagnosis not present

## 2015-12-13 DIAGNOSIS — M519 Unspecified thoracic, thoracolumbar and lumbosacral intervertebral disc disorder: Secondary | ICD-10-CM | POA: Diagnosis not present

## 2016-01-04 DIAGNOSIS — G4733 Obstructive sleep apnea (adult) (pediatric): Secondary | ICD-10-CM | POA: Diagnosis not present

## 2016-01-09 DIAGNOSIS — K5900 Constipation, unspecified: Secondary | ICD-10-CM | POA: Diagnosis not present

## 2016-01-09 DIAGNOSIS — R351 Nocturia: Secondary | ICD-10-CM | POA: Diagnosis not present

## 2016-01-09 DIAGNOSIS — N3020 Other chronic cystitis without hematuria: Secondary | ICD-10-CM | POA: Diagnosis not present

## 2016-03-19 DIAGNOSIS — M25552 Pain in left hip: Secondary | ICD-10-CM | POA: Diagnosis not present

## 2016-03-19 DIAGNOSIS — M1612 Unilateral primary osteoarthritis, left hip: Secondary | ICD-10-CM | POA: Diagnosis not present

## 2016-03-27 DIAGNOSIS — E1129 Type 2 diabetes mellitus with other diabetic kidney complication: Secondary | ICD-10-CM | POA: Diagnosis not present

## 2016-03-27 DIAGNOSIS — E039 Hypothyroidism, unspecified: Secondary | ICD-10-CM | POA: Diagnosis not present

## 2016-03-27 DIAGNOSIS — D649 Anemia, unspecified: Secondary | ICD-10-CM | POA: Diagnosis not present

## 2016-04-01 DIAGNOSIS — E1129 Type 2 diabetes mellitus with other diabetic kidney complication: Secondary | ICD-10-CM | POA: Diagnosis not present

## 2016-04-01 DIAGNOSIS — Z0000 Encounter for general adult medical examination without abnormal findings: Secondary | ICD-10-CM | POA: Diagnosis not present

## 2016-04-01 DIAGNOSIS — E1165 Type 2 diabetes mellitus with hyperglycemia: Secondary | ICD-10-CM | POA: Diagnosis not present

## 2016-04-02 DIAGNOSIS — R635 Abnormal weight gain: Secondary | ICD-10-CM | POA: Diagnosis not present

## 2016-04-02 DIAGNOSIS — R948 Abnormal results of function studies of other organs and systems: Secondary | ICD-10-CM | POA: Diagnosis not present

## 2016-04-02 DIAGNOSIS — Z6841 Body Mass Index (BMI) 40.0 and over, adult: Secondary | ICD-10-CM | POA: Diagnosis not present

## 2016-04-06 DIAGNOSIS — N179 Acute kidney failure, unspecified: Secondary | ICD-10-CM | POA: Diagnosis not present

## 2016-04-06 DIAGNOSIS — E889 Metabolic disorder, unspecified: Secondary | ICD-10-CM | POA: Diagnosis not present

## 2016-04-06 DIAGNOSIS — E1122 Type 2 diabetes mellitus with diabetic chronic kidney disease: Secondary | ICD-10-CM | POA: Diagnosis not present

## 2016-04-06 DIAGNOSIS — I129 Hypertensive chronic kidney disease with stage 1 through stage 4 chronic kidney disease, or unspecified chronic kidney disease: Secondary | ICD-10-CM | POA: Diagnosis not present

## 2016-04-10 DIAGNOSIS — R9431 Abnormal electrocardiogram [ECG] [EKG]: Secondary | ICD-10-CM | POA: Diagnosis not present

## 2016-04-13 DIAGNOSIS — N179 Acute kidney failure, unspecified: Secondary | ICD-10-CM | POA: Diagnosis not present

## 2016-04-21 DIAGNOSIS — R300 Dysuria: Secondary | ICD-10-CM | POA: Diagnosis not present

## 2016-06-08 ENCOUNTER — Ambulatory Visit: Payer: Self-pay | Admitting: Orthopedic Surgery

## 2016-06-08 NOTE — H&P (Signed)
Amanda [name] DOB: 06-08-1940 Married / Language: English / Race: White Female Date of Admission:  07/01/2016 CC:  Left Hip Pain History of Present Illness The patient is a 76 year old female who comes in for a preoperative History and Physical. The patient is scheduled for a left total hip arthroplasty (anterior) to be performed by Dr. Dione Plover. Aluisio, MD at [facility] on 07-01-2016. The patient reports left hip problems including pain symptoms that have been present for 1 year(s). The symptoms began without any known injury. Symptoms reported include hip pain, pain with weightbearing, night pain and difficulty ambulating The patient reports symptoms radiating to the: left thigh. The patient describes the hip problem as sharp, dull and aching. Onset of symptoms was gradual. The patient feels as if their symptoms are does feel they are worsening. Current treatment includes non-opioid analgesics (Tylenol). Prior to being seen today the patient was previously evaluated by a colleague. Previous workup for this problem has included hip x-rays. Previous treatment for this problem has included corticosteroid injection (lasted 2 days) and physical therapy (no help). Amanda [name] was seen as a second opinion for the left hip and thigh pain. It has been ongoing for over a year now but has progressed more recently. She has been seen and treated by Ervin Knack for left sided pain. She has undergone xrays and scanning. She was initially evaluated early last year and was sent for therapy for her hip but did not get much benefit from it. 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 She was sent for an ESI and it was reported that she did not receive benefit from the injection. She did, however, get a few days of good relief from the I-A hip injection. She  states that injection improved her condition temporarily for about two days. She could walk up and down stairs much better for those two days. Due to the fact that it was believed that the hip was more of the primary source of her pain and dysfunction, she was sent over to Dr. Wynelle Link for evaluation and treatment. She has been treated at [facility]. Pain is in the left groin, lateral hip, and buttock area, traveling into her thigh. It is getting progressively worse. About a year ago, she started noticing it more frequently, now it is hurting all the time. She has had physical therapy without benefit. She has had a corticosteroid injection which helped for only a couple of days. Pain is now occurring at all times and limiting what she can and cannot do. It hurts her at night. She has had a lumbar evaluation and she has got spinal stenosis, but did not have any ruptured disk. She had an epidural steroid injection, which did not help her at all. She has had more limited motion with regards to the hip and more limited function. It is getting harder to do activities of daily living. AP pelvis and lateral left hip. She now bone-on-bone arthritis in the hip with some erosion of the femoral head. This is a marked progression compared to the previous x-rays. She has got advanced end-stage arthritis, left hip, rapidly progressive in nature. At this point, the most predictable means of improving her pain and function is going to be total hip arthroplasty. They have been treated conservatively in  the past for the above stated problem and despite conservative measures, they continue to have progressive pain and severe functional limitations and dysfunction. They have failed non-operative management including home exercise, medications, and injections. It is felt that they would benefit from undergoing total joint replacement. Risks and benefits of the procedure have been discussed with the patient and they elect to  proceed with surgery. There are no active contraindications to surgery such as ongoing infection or rapidly progressive neurological disease.  Problem List/Past Medical  Pain of left hip joint (M25.552)  Primary osteoarthritis of left hip (M16.12)  Allergic Urticaria  Cardiac Arrhythmia  Congestive Heart Failure  Diabetes Mellitus, Type II  Gastroesophageal Reflux Disease  Gout  High blood pressure  Hypercholesterolemia  Osteoarthritis  Sleep Apnea  has CPAP at home Vertigo  Tinnitus  Bronchitis  Past History Pneumonia  Past History Atrial Fibrillation  History of Pleural Effusion  Measles  Rubella   Allergies  Cipro *Fluoroquinolones**  ankle and heel pain Statins  joint pain  Family History Cancer  Brother, Father, Maternal Grandfather, Mother. Cerebrovascular Accident  Father, Maternal Grandmother, Paternal Grandmother. child Congestive Heart Failure  Father. Diabetes Mellitus  Sister. First Degree Relatives  reported Heart Disease  Brother, Sister. Heart disease in female family member before age 38  Heart disease in female family member before age 46  Hypertension  Brother, Father, Mother, Sister. child Osteoarthritis  Mother.  Social History Children  2 Current drinker  03/19/2016: Currently drinks wine and hard liquor less than 5 times per week Current work status  retired Exercise  Exercises never Living situation  live with spouse Marital status  married No history of drug/alcohol rehab  Not under pain contract  Number of flights of stairs before winded  1 Tobacco / smoke exposure  03/19/2016: no Tobacco use  Former smoker. 03/19/2016: smoke(d) 1 1/2 pack(s) per day Advance Directives  Living Will, Healthcare POA  Medication History Tessalon Perles Active. Nitroglyerin Active. Colcrys Active. ProAir Inhaler Active. TraMADol HCl (50MG  Tablet, 1-2 Tablet Oral every 6-8 hours as needed for pain, Taken  starting 06/01/2016) Active. (called to [place]; [phone]) Vitamin D (2000UNIT Capsule, Oral) Active. Vitamin B12 (1000MCG Tablet ER, Oral) Active. Osteo Bi-Flex Adv Double St (Oral) Active. Ocuvite Adult Formula (Oral) Active. Miralax Active. Folic Acid (0.8MG  Capsule, Oral) Active. Florajen3 (Oral) Active. CoQ10 (100MG  Capsule, Oral) Active. Biotin (5000MCG Capsule, Oral) Active. Benefiber (Oral) Active. AZO Cranberry Gummies (500MG  Tablet Chewable, Oral) Active. Ezetimibe (10MG  Tablet, Oral) Active. Xarelto (15MG  Tablet, Oral) Active. Welchol (625MG  Tablet, Oral) Active. Uloric (40MG  Tablet, Oral) Active. Sensipar Active. Quinapril HCl (20MG  Tablet, Oral) Active. Omeprazole (20MG  Capsule DR, Oral) Active. Omega 3-6-9 Complex (Oral) Active. Multigen Plus (50-101-1MG  Tablet, Oral) Active. Levothyroxine Sodium (25MCG Tablet, Oral) Active. Gemfibrozil (600MG  Tablet, Oral) Active. Furosemide (40MG  Tablet, Oral) Active. Estrace (0.1MG /GM Cream, Vaginal) Active. Dilt-XR (240MG  Capsule ER 24HR, Oral) Active. Aspirin (81MG  Tablet, Oral) Active. Amiodarone HCl (200MG  Tablet, Oral) Active.  Past Surgical History  Colon Polyp Removal - Colonoscopy  Hemorrhoidectomy  Tubal Ligation  Cardioversion   Review of Systems General Not Present- Chills, Fatigue, Fever, Memory Loss, Night Sweats, Weight Gain and Weight Loss. Skin Not Present- Eczema, Hives, Itching, Lesions and Rash. HEENT Present- Tinnitus. Not Present- Dentures, Double Vision, Headache, Hearing Loss and Visual Loss. Respiratory Present- Shortness of breath with exertion. Not Present- Allergies, Chronic Cough, Coughing up blood and Shortness of breath at rest. Cardiovascular Not Present- Chest Pain, Difficulty Breathing Lying Down,  Murmur, Palpitations, Racing/skipping heartbeats and Swelling. Gastrointestinal Present- Constipation. Not Present- Abdominal Pain, Bloody Stool,  Diarrhea, Difficulty Swallowing, Heartburn, Jaundice, Loss of appetitie, Nausea and Vomiting. Female Genitourinary Present- Urinating at Night. Not Present- Blood in Urine, Discharge, Flank Pain, Incontinence, Painful Urination, Urgency, Urinary frequency, Urinary Retention and Weak urinary stream. Musculoskeletal Present- Back Pain and Joint Pain. Not Present- Joint Swelling, Morning Stiffness, Muscle Pain, Muscle Weakness and Spasms. Neurological Not Present- Blackout spells, Difficulty with balance, Dizziness, Paralysis, Tremor and Weakness. Psychiatric Not Present- Insomnia.  Vitals Weight: 207 lb Height: 63in Body Surface Area: 1.96 m Body Mass Index: 36.67 kg/m  Pulse: 76 (Regular)  Resp.: 12 (Unlabored)  BP: 126/58 (Sitting, Right Arm, Standard)  Physical Exam General Mental Status -Alert, cooperative and good historian. General Appearance-pleasant, Not in acute distress. Orientation-Oriented X3. Build & Nutrition-Overweight, Well nourished and Well developed. Gait-abnormal and Use of assistive device(cane).  Head and Neck Head-normocephalic, atraumatic . Neck Global Assessment - supple, no bruit auscultated on the right, no bruit auscultated on the left.  Eye Vision-Wears corrective lenses. Pupil - Bilateral-Regular and Round. Motion - Bilateral-EOMI.  Chest and Lung Exam Auscultation Breath sounds - clear at anterior chest wall and clear at posterior chest wall. Adventitious sounds - No Adventitious sounds.  Cardiovascular Auscultation Rhythm - Regular rate and rhythm. Heart Sounds - S1 WNL and S2 WNL. Murmurs & Other Heart Sounds: Murmur 1 - Location - Aortic Area. Timing - Early systolic. Grade - II/VI. Character - Low pitched.  Abdomen Inspection Contour - Generalized moderate distention. Palpation/Percussion Tenderness - Abdomen is non-tender to palpation. Rigidity (guarding) - Abdomen is soft. Auscultation Auscultation of the  abdomen reveals - Bowel sounds normal.  Female Genitourinary Note: Not done, not pertinent to present illness  Musculoskeletal Note: She is alert and oriented, in no apparent distress. Evaluation of the right hip shows normal range of motion. No discomfort. Left hip flexion 100, rotation in 10 out 30, abduction 30. She has a significantly antalgic gait pattern. Her knee exam is normal. Pulse, sensation, and motor intact.  RADIOGRAPHS AP pelvis and lateral left hip. She now bone-on-bone arthritis in the hip with some erosion of the femoral head. This is a marked progression compared to the previous x-rays.  Assessment & Plan Primary osteoarthritis of left hip (M16.12)  Note:Surgical Plans: Left Total Hip Replacement - Anterior Approach  Disposition: Home with family  PCP: Dr. Cyndy Freeze - patient given verbal clearance to proceed with surgery. Cards: Dr. Agustin Cree - pending at time of H&P  IV TXA  Anesthesia Issues: None  Patient was instructed on what medications to stop prior to surgery.  Signed electronically by Joelene Millin, III PA-C

## 2016-06-08 NOTE — H&P (Signed)
Amanda [name] DOB: [birth year] Married / Language: English / Race: White Female Date of Admission:  07/01/2016 CC:  Left Hip Pain History of Present Illness The patient is a 76 year old female who comes in for a preoperative History and Physical. The patient is scheduled for a left total hip arthroplasty (anterior) to be performed by Dr. Dione Plover. Amanda [name] at [facility] on 07-01-2016. The patient reports left hip problems including pain symptoms that have been present for 1 year(s). The symptoms began without any known injury. Symptoms reported include hip pain, pain with weightbearing, night pain and difficulty ambulating The patient reports symptoms radiating to the: left thigh. The patient describes the hip problem as sharp, dull and aching. Onset of symptoms was gradual. The patient feels as if their symptoms are does feel they are worsening. Current treatment includes non-opioid analgesics (Tylenol). Prior to being seen today the patient was previously evaluated by a colleague. Previous workup for this problem has included hip x-rays. Previous treatment for this problem has included corticosteroid injection (lasted 2 days) and physical therapy (no help). Amanda [name] was seen as a second opinion for the left hip and thigh pain. It has been ongoing for over a year now but has progressed more recently. She has been seen and treated by Ervin Knack for left sided pain. She has undergone xrays and scanning. She was initially evaluated early last year and was sent for therapy for her hip but did not get much benefit from it. 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 She was sent for an ESI and it was reported that she did not receive benefit from the injection. She did, however, get a few days of good relief from the I-A hip injection. She  states that injection improved her condition temporarily for about two days. She could walk up and down stairs much better for those two days. Due to the fact that it was believed that the hip was more of the primary source of her pain and dysfunction, she was sent over to Dr. Wynelle Link for evaluation and treatment. She has been treated at [facility]. Pain is in the left groin, lateral hip, and buttock area, traveling into her thigh. It is getting progressively worse. About a year ago, she started noticing it more frequently, now it is hurting all the time. She has had physical therapy without benefit. She has had a corticosteroid injection which helped for only a couple of days. Pain is now occurring at all times and limiting what she can and cannot do. It hurts her at night. She has had a lumbar evaluation and she has got spinal stenosis, but did not have any ruptured disk. She had an epidural steroid injection, which did not help her at all. She has had more limited motion with regards to the hip and more limited function. It is getting harder to do activities of daily living. AP pelvis and lateral left hip. She now bone-on-bone arthritis in the hip with some erosion of the femoral head. This is a marked progression compared to the previous x-rays. She has got advanced end-stage arthritis, left hip, rapidly progressive in nature. At this point, the most predictable means of improving her pain and function is going to be total hip arthroplasty. They have been treated conservatively in  the past for the above stated problem and despite conservative measures, they continue to have progressive pain and severe functional limitations and dysfunction. They have failed non-operative management including home exercise, medications, and injections. It is felt that they would benefit from undergoing total joint replacement. Risks and benefits of the procedure have been discussed with the patient and they elect to  proceed with surgery. There are no active contraindications to surgery such as ongoing infection or rapidly progressive neurological disease.  Problem List/Past Medical  Pain of left hip joint (M25.552)  Primary osteoarthritis of left hip (M16.12)  Allergic Urticaria  Cardiac Arrhythmia  Congestive Heart Failure  Diabetes Mellitus, Type II  Gastroesophageal Reflux Disease  Gout  High blood pressure  Hypercholesterolemia  Osteoarthritis  Sleep Apnea  has CPAP at home Vertigo  Tinnitus  Bronchitis  Past History Pneumonia  Past History Atrial Fibrillation  History of Pleural Effusion  Measles  Rubella   Allergies  Cipro *Fluoroquinolones**  ankle and heel pain Statins  joint pain  Family History Cancer  Brother, Father, Maternal Grandfather, Mother. Cerebrovascular Accident  Father, Maternal Grandmother, Paternal Grandmother. child Congestive Heart Failure  Father. Diabetes Mellitus  Sister. First Degree Relatives  reported Heart Disease  Brother, Sister. Heart disease in female family member before age 80  Heart disease in female family member before age 38  Hypertension  Brother, Father, Mother, Sister. child Osteoarthritis  Mother.  Social History Children  2 Current drinker  03/19/2016: Currently drinks wine and hard liquor less than 5 times per week Current work status  retired Exercise  Exercises never Living situation  live with spouse Marital status  married No history of drug/alcohol rehab  Not under pain contract  Number of flights of stairs before winded  1 Tobacco / smoke exposure  03/19/2016: no Tobacco use  Former smoker. 03/19/2016: smoke(d) 1 1/2 pack(s) per day Advance Directives  Living Will, Healthcare POA  Medication History Tessalon Perles Active. Nitroglyerin Active. Colcrys Active. ProAir Inhaler Active. TraMADol HCl (50MG  Tablet, 1-2 Tablet Oral every 6-8 hours as needed for pain, Taken  starting 06/01/2016) Active. (called to [facility]; [phone]) Vitamin D (2000UNIT Capsule, Oral) Active. Vitamin B12 (1000MCG Tablet ER, Oral) Active. Osteo Bi-Flex Adv Double St (Oral) Active. Ocuvite Adult Formula (Oral) Active. Miralax Active. Folic Acid (0.8MG  Capsule, Oral) Active. Florajen3 (Oral) Active. CoQ10 (100MG  Capsule, Oral) Active. Biotin (5000MCG Capsule, Oral) Active. Benefiber (Oral) Active. AZO Cranberry Gummies (500MG  Tablet Chewable, Oral) Active. Ezetimibe (10MG  Tablet, Oral) Active. Xarelto (15MG  Tablet, Oral) Active. Welchol (625MG  Tablet, Oral) Active. Uloric (40MG  Tablet, Oral) Active. Sensipar Active. Quinapril HCl (20MG  Tablet, Oral) Active. Omeprazole (20MG  Capsule DR, Oral) Active. Omega 3-6-9 Complex (Oral) Active. Multigen Plus (50-101-1MG  Tablet, Oral) Active. Levothyroxine Sodium (25MCG Tablet, Oral) Active. Gemfibrozil (600MG  Tablet, Oral) Active. Furosemide (40MG  Tablet, Oral) Active. Estrace (0.1MG /GM Cream, Vaginal) Active. Dilt-XR (240MG  Capsule ER 24HR, Oral) Active. Aspirin (81MG  Tablet, Oral) Active. Amiodarone HCl (200MG  Tablet, Oral) Active.  Past Surgical History  Colon Polyp Removal - Colonoscopy  Hemorrhoidectomy  Tubal Ligation  Cardioversion   Review of Systems General Not Present- Chills, Fatigue, Fever, Memory Loss, Night Sweats, Weight Gain and Weight Loss. Skin Not Present- Eczema, Hives, Itching, Lesions and Rash. HEENT Present- Tinnitus. Not Present- Dentures, Double Vision, Headache, Hearing Loss and Visual Loss. Respiratory Present- Shortness of breath with exertion. Not Present- Allergies, Chronic Cough, Coughing up blood and Shortness of breath at rest. Cardiovascular Not Present- Chest Pain, Difficulty Breathing Lying Down,  Murmur, Palpitations, Racing/skipping heartbeats and Swelling. Gastrointestinal Present- Constipation. Not Present- Abdominal Pain, Bloody  Stool, Diarrhea, Difficulty Swallowing, Heartburn, Jaundice, Loss of appetitie, Nausea and Vomiting. Female Genitourinary Present- Urinating at Night. Not Present- Blood in Urine, Discharge, Flank Pain, Incontinence, Painful Urination, Urgency, Urinary frequency, Urinary Retention and Weak urinary stream. Musculoskeletal Present- Back Pain and Joint Pain. Not Present- Joint Swelling, Morning Stiffness, Muscle Pain, Muscle Weakness and Spasms. Neurological Not Present- Blackout spells, Difficulty with balance, Dizziness, Paralysis, Tremor and Weakness. Psychiatric Not Present- Insomnia.  Vitals Weight: 207 lb Height: 63in Body Surface Area: 1.96 m Body Mass Index: 36.67 kg/m  Pulse: 76 (Regular)  Resp.: 12 (Unlabored)  BP: 126/58 (Sitting, Right Arm, Standard)  Physical Exam General Mental Status -Alert, cooperative and good historian. General Appearance-pleasant, Not in acute distress. Orientation-Oriented X3. Build & Nutrition-Overweight, Well nourished and Well developed. Gait-abnormal and Use of assistive device(cane).  Head and Neck Head-normocephalic, atraumatic . Neck Global Assessment - supple, no bruit auscultated on the right, no bruit auscultated on the left.  Eye Vision-Wears corrective lenses. Pupil - Bilateral-Regular and Round. Motion - Bilateral-EOMI.  Chest and Lung Exam Auscultation Breath sounds - clear at anterior chest wall and clear at posterior chest wall. Adventitious sounds - No Adventitious sounds.  Cardiovascular Auscultation Rhythm - Regular rate and rhythm. Heart Sounds - S1 WNL and S2 WNL. Murmurs & Other Heart Sounds: Murmur 1 - Location - Aortic Area. Timing - Early systolic. Grade - II/VI. Character - Low pitched.  Abdomen Inspection Contour - Generalized moderate distention. Palpation/Percussion Tenderness - Abdomen is non-tender to palpation. Rigidity (guarding) - Abdomen is  soft. Auscultation Auscultation of the abdomen reveals - Bowel sounds normal.  Female Genitourinary Note: Not done, not pertinent to present illness  Musculoskeletal Note: She is alert and oriented, in no apparent distress. Evaluation of the right hip shows normal range of motion. No discomfort. Left hip flexion 100, rotation in 10 out 30, abduction 30. She has a significantly antalgic gait pattern. Her knee exam is normal. Pulse, sensation, and motor intact.  RADIOGRAPHS AP pelvis and lateral left hip. She now bone-on-bone arthritis in the hip with some erosion of the femoral head. This is a marked progression compared to the previous x-rays.  Assessment & Plan Primary osteoarthritis of left hip (M16.12)  Note:Surgical Plans: Left Total Hip Replacement - Anterior Approach  Disposition: Home with family  PCP: Dr. Cyndy Freeze - patient given verbal clearance to proceed with surgery. Cards: Dr. Agustin Cree - pending at time of H&P  IV TXA  Anesthesia Issues: None  Patient was instructed on what medications to stop prior to surgery.  Signed electronically by Joelene Millin, III PA-C

## 2016-06-09 DIAGNOSIS — I129 Hypertensive chronic kidney disease with stage 1 through stage 4 chronic kidney disease, or unspecified chronic kidney disease: Secondary | ICD-10-CM | POA: Diagnosis not present

## 2016-06-09 DIAGNOSIS — E889 Metabolic disorder, unspecified: Secondary | ICD-10-CM | POA: Diagnosis not present

## 2016-06-09 DIAGNOSIS — E1122 Type 2 diabetes mellitus with diabetic chronic kidney disease: Secondary | ICD-10-CM | POA: Diagnosis not present

## 2016-06-09 DIAGNOSIS — E559 Vitamin D deficiency, unspecified: Secondary | ICD-10-CM | POA: Diagnosis not present

## 2016-06-16 DIAGNOSIS — Z1389 Encounter for screening for other disorder: Secondary | ICD-10-CM | POA: Diagnosis not present

## 2016-06-16 DIAGNOSIS — E1129 Type 2 diabetes mellitus with other diabetic kidney complication: Secondary | ICD-10-CM | POA: Diagnosis not present

## 2016-06-16 DIAGNOSIS — E1165 Type 2 diabetes mellitus with hyperglycemia: Secondary | ICD-10-CM | POA: Diagnosis not present

## 2016-06-16 DIAGNOSIS — R2242 Localized swelling, mass and lump, left lower limb: Secondary | ICD-10-CM | POA: Diagnosis not present

## 2016-06-17 DIAGNOSIS — I10 Essential (primary) hypertension: Secondary | ICD-10-CM | POA: Diagnosis not present

## 2016-06-17 DIAGNOSIS — I480 Paroxysmal atrial fibrillation: Secondary | ICD-10-CM | POA: Diagnosis not present

## 2016-06-17 DIAGNOSIS — E119 Type 2 diabetes mellitus without complications: Secondary | ICD-10-CM | POA: Diagnosis not present

## 2016-06-17 DIAGNOSIS — E785 Hyperlipidemia, unspecified: Secondary | ICD-10-CM | POA: Diagnosis not present

## 2016-06-18 ENCOUNTER — Ambulatory Visit: Payer: Self-pay | Admitting: Orthopedic Surgery

## 2016-06-22 NOTE — Patient Instructions (Addendum)
Amanda [name]  06/22/2016   Your procedure is scheduled on: 07-01-16   Report to [facility] Main Entrance Follow signs to Short Stay on First floor at 05:00 AM  Call this number if you have problems the morning of surgery  [phone]   Remember: ONLY 1 PERSON MAY GO WITH YOU TO SHORT STAY TO GET  READY MORNING OF [place].  Do not eat food or drink liquids :After Midnight.     Take these medicines the morning of surgery with A SIP OF WATER: Amiodarone (Pacerone), Cinacalcet (Sensipar), Omeprazole (Prilosec), Dilitiazem  (Dilacor XR), Levothyroxine (Synthroid), Ranolazine (Renexa), Febuxostat (Uloric)   DO NOT TAKE ANY DIABETIC MEDICATIONS DAY OF YOUR SURGERY                               You may not have any metal on your body including hair pins and              piercings  Do not wear jewelry, make-up, lotions, powders or perfumes, deodorant             Do not wear nail polish.  Do not shave  48 hours prior to surgery.             Do not bring valuables to the hospital. [place].  Contacts, dentures or bridgework may not be worn into surgery.  Leave suitcase in the car. After surgery it may be brought to your room.      Please read over the following fact sheets you were given: _____________________________________________________________________             How to Manage Your Diabetes Before and After Surgery  Why is it important to control my blood sugar before and after surgery? . Improving blood sugar levels before and after surgery helps healing and can limit problems. . A way of improving blood sugar control is eating a healthy diet by: o  Eating less sugar and carbohydrates o  Increasing activity/exercise o  Talking with your doctor about reaching your blood sugar goals . High blood sugars (greater than 180 mg/dL) can raise your risk of infections and slow your recovery, so you will need  to focus on controlling your diabetes during the weeks before surgery. . Make sure that the doctor who takes care of your diabetes knows about your planned surgery including the date and location.  How do I manage my blood sugar before surgery? . Check your blood sugar at least 4 times a day, starting 2 days before surgery, to make sure that the level is not too high or low. o Check your blood sugar the morning of your surgery when you wake up and every 2 hours until you get to the Short Stay unit. . If your blood sugar is less than 70 mg/dL, you will need to treat for low blood sugar: o Do not take insulin. o Treat a low blood sugar (less than 70 mg/dL) with  cup of clear juice (cranberry or apple), 4 glucose tablets, OR glucose gel. o Recheck blood sugar in 15 minutes after treatment (to make sure it is greater than 70 mg/dL). If your blood sugar is not greater than 70 mg/dL on recheck, call  [phone] for further instructions. . Report your blood sugar to the short stay nurse when you get to Short Stay.  . If you are admitted to the hospital after surgery: o Your blood sugar will be checked by the staff and you will probably be given insulin after surgery (instead of oral diabetes medicines) to make sure you have good blood sugar levels. o The goal for blood sugar control after surgery is 80-180 mg/dL.   WHAT DO I DO ABOUT MY DIABETES MEDICATION?  Marland Kitchen Do not take oral diabetes medicines (pills) the morning of surgery.  . THE NIGHT BEFORE SURGERY, take     units of       insulin.       . THE MORNING OF SURGERY, take   units of         insulin.  . The day of surgery, do not take other diabetes injectables, including Byetta (exenatide), Bydureon (exenatide ER), Victoza (liraglutide), or Trulicity (dulaglutide).    Patient Signature:  Date:   Nurse Signature:  Date:   Reviewed and Endorsed by [facility] Patient Education Committee, August 2015  [facility] - Preparing for  Surgery Before surgery, you can play an important role.  Because skin is not sterile, your skin needs to be as free of germs as possible.  You can reduce the number of germs on your skin by washing with CHG (chlorahexidine gluconate) soap before surgery.  CHG is an antiseptic cleaner which kills germs and bonds with the skin to continue killing germs even after washing. Please DO NOT use if you have an allergy to CHG or antibacterial soaps.  If your skin becomes reddened/irritated stop using the CHG and inform your nurse when you arrive at Short Stay. Do not shave (including legs and underarms) for at least 48 hours prior to the first CHG shower.  You may shave your face/neck. Please follow these instructions carefully:  1.  Shower with CHG Soap the night before surgery and the  morning of Surgery.  2.  If you choose to wash your hair, wash your hair first as usual with your  normal  shampoo.  3.  After you shampoo, rinse your hair and body thoroughly to remove the  shampoo.                           4.  Use CHG as you would any other liquid soap.  You can apply chg directly  to the skin and wash                       Gently with a scrungie or clean washcloth.  5.  Apply the CHG Soap to your body ONLY FROM THE NECK DOWN.   Do not use on face/ open                           Wound or open sores. Avoid contact with eyes, ears mouth and genitals (private parts).                       Wash face,  Genitals (private parts) with your normal soap.             6.  Wash thoroughly, paying special attention to the area where your surgery  will be performed.  7.  Thoroughly rinse your body with warm water from the  neck down.  8.  DO NOT shower/wash with your normal soap after using and rinsing off  the CHG Soap.                9.  Pat yourself dry with a clean towel.            10.  Wear clean pajamas.            11.  Place clean sheets on your bed the night of your first shower and do not  sleep with pets. Day  of Surgery : Do not apply any lotions/deodorants the morning of surgery.  Please wear clean clothes to the hospital/surgery center.  FAILURE TO FOLLOW THESE INSTRUCTIONS MAY RESULT IN THE CANCELLATION OF YOUR SURGERY PATIENT SIGNATURE_________________________________  NURSE SIGNATURE__________________________________  ________________________________________________________________________   Adam Phenix  An incentive spirometer is a tool that can help keep your lungs clear and active. This tool measures how well you are filling your lungs with each breath. Taking long deep breaths may help reverse or decrease the chance of developing breathing (pulmonary) problems (especially infection) following:  A long period of time when you are unable to move or be active. BEFORE THE PROCEDURE   If the spirometer includes an indicator to show your best effort, your nurse or respiratory therapist will set it to a desired goal.  If possible, sit up straight or lean slightly forward. Try not to slouch.  Hold the incentive spirometer in an upright position. INSTRUCTIONS FOR USE  1. Sit on the edge of your bed if possible, or sit up as far as you can in bed or on a chair. 2. Hold the incentive spirometer in an upright position. 3. Breathe out normally. 4. Place the mouthpiece in your mouth and seal your lips tightly around it. 5. Breathe in slowly and as deeply as possible, raising the piston or the ball toward the top of the column. 6. Hold your breath for 3-5 seconds or for as long as possible. Allow the piston or ball to fall to the bottom of the column. 7. Remove the mouthpiece from your mouth and breathe out normally. 8. Rest for a few seconds and repeat Steps 1 through 7 at least 10 times every 1-2 hours when you are awake. Take your time and take a few normal breaths between deep breaths. 9. The spirometer may include an indicator to show your best effort. Use the indicator as a goal  to work toward during each repetition. 10. After each set of 10 deep breaths, practice coughing to be sure your lungs are clear. If you have an incision (the cut made at the time of surgery), support your incision when coughing by placing a pillow or rolled up towels firmly against it. Once you are able to get out of bed, walk around indoors and cough well. You may stop using the incentive spirometer when instructed by your caregiver.  RISKS AND COMPLICATIONS  Take your time so you do not get dizzy or light-headed.  If you are in pain, you may need to take or ask for pain medication before doing incentive spirometry. It is harder to take a deep breath if you are having pain. AFTER USE  Rest and breathe slowly and easily.  It can be helpful to keep track of a log of your progress. Your caregiver can provide you with a simple table to help with this. If you are using the spirometer at home, follow these instructions: [place]  IF:   You are having difficultly using the spirometer.  You have trouble using the spirometer as often as instructed.  Your pain medication is not giving enough relief while using the spirometer.  You develop fever of 100.5 F (38.1 C) or higher. SEEK IMMEDIATE MEDICAL CARE IF:   You cough up bloody sputum that had not been present before.  You develop fever of 102 F (38.9 C) or greater.  You develop worsening pain at or near the incision site. MAKE SURE YOU:   Understand these instructions.  Will watch your condition.  Will get help right away if you are not doing well or get worse. Document Released: 06/01/2006 Document Revised: 04/13/2011 Document Reviewed: 08/02/2006 ExitCare Patient Information 2014 ExitCare, [place].   ________________________________________________________________________  WHAT IS A BLOOD TRANSFUSION? Blood Transfusion Information  A transfusion is the replacement of blood or some of its parts. Blood is made up of  multiple cells which provide different functions.  Red blood cells carry oxygen and are used for blood loss replacement.  White blood cells fight against infection.  Platelets control bleeding.  Plasma helps clot blood.  Other blood products are available for specialized needs, such as hemophilia or other clotting disorders. BEFORE THE TRANSFUSION  Who gives blood for transfusions?   Healthy volunteers who are fully evaluated to make sure their blood is safe. This is blood bank blood. Transfusion therapy is the safest it has ever been in the practice of medicine. Before blood is taken from a donor, a complete history is taken to make sure that person has no history of diseases nor engages in risky social behavior (examples are intravenous drug use or sexual activity with multiple partners). The donor's travel history is screened to minimize risk of transmitting infections, such as malaria. The donated blood is tested for signs of infectious diseases, such as HIV and hepatitis. The blood is then tested to be sure it is compatible with you in order to minimize the chance of a transfusion reaction. If you or a relative donates blood, this is often done in anticipation of surgery and is not appropriate for emergency situations. It takes many days to process the donated blood. RISKS AND COMPLICATIONS Although transfusion therapy is very safe and saves many lives, the main dangers of transfusion include:   Getting an infectious disease.  Developing a transfusion reaction. This is an allergic reaction to something in the blood you were given. Every precaution is taken to prevent this. The decision to have a blood transfusion has been considered carefully by your caregiver before blood is given. Blood is not given unless the benefits outweigh the risks. AFTER THE TRANSFUSION  Right after receiving a blood transfusion, you will usually feel much better and more energetic. This is especially true if  your red blood cells have gotten low (anemic). The transfusion raises the level of the red blood cells which carry oxygen, and this usually causes an energy increase.  The nurse administering the transfusion will monitor you carefully for complications. HOME CARE INSTRUCTIONS  No special instructions are needed after a transfusion. You may find your energy is better. Speak with your caregiver about any limitations on activity for underlying diseases you may have. SEEK MEDICAL CARE IF:   Your condition is not improving after your transfusion.  You develop redness or irritation at the intravenous (IV) site. SEEK IMMEDIATE MEDICAL CARE IF:  Any of the following symptoms occur over the next 12 hours:  Shaking chills.  You  have a temperature by mouth above 102 F (38.9 C), not controlled by medicine.  Chest, back, or muscle pain.  People around you feel you are not acting correctly or are confused.  Shortness of breath or difficulty breathing.  Dizziness and fainting.  You get a rash or develop hives.  You have a decrease in urine output.  Your urine turns a dark color or changes to pink, red, or brown. Any of the following symptoms occur over the next 10 days:  You have a temperature by mouth above 102 F (38.9 C), not controlled by medicine.  Shortness of breath.  Weakness after normal activity.  The white part of the eye turns yellow (jaundice).  You have a decrease in the amount of urine or are urinating less often.  Your urine turns a dark color or changes to pink, red, or brown. Document Released: 01/17/2000 Document Revised: 04/13/2011 Document Reviewed: 09/05/2007 [facility] Patient Information 2014 [place], [place].  _______________________________________________________________________

## 2016-06-23 DIAGNOSIS — R079 Chest pain, unspecified: Secondary | ICD-10-CM | POA: Insufficient documentation

## 2016-06-23 DIAGNOSIS — I480 Paroxysmal atrial fibrillation: Secondary | ICD-10-CM | POA: Diagnosis not present

## 2016-06-23 DIAGNOSIS — Z79899 Other long term (current) drug therapy: Secondary | ICD-10-CM | POA: Diagnosis not present

## 2016-06-23 DIAGNOSIS — R9439 Abnormal result of other cardiovascular function study: Secondary | ICD-10-CM | POA: Insufficient documentation

## 2016-06-23 DIAGNOSIS — Z794 Long term (current) use of insulin: Secondary | ICD-10-CM | POA: Diagnosis not present

## 2016-06-23 DIAGNOSIS — R072 Precordial pain: Secondary | ICD-10-CM | POA: Diagnosis not present

## 2016-06-23 DIAGNOSIS — E119 Type 2 diabetes mellitus without complications: Secondary | ICD-10-CM | POA: Diagnosis not present

## 2016-06-23 DIAGNOSIS — E782 Mixed hyperlipidemia: Secondary | ICD-10-CM | POA: Diagnosis not present

## 2016-06-23 DIAGNOSIS — I4891 Unspecified atrial fibrillation: Secondary | ICD-10-CM | POA: Diagnosis not present

## 2016-06-23 DIAGNOSIS — E785 Hyperlipidemia, unspecified: Secondary | ICD-10-CM | POA: Diagnosis not present

## 2016-06-23 DIAGNOSIS — R0602 Shortness of breath: Secondary | ICD-10-CM | POA: Diagnosis not present

## 2016-06-23 DIAGNOSIS — I10 Essential (primary) hypertension: Secondary | ICD-10-CM | POA: Diagnosis not present

## 2016-06-23 DIAGNOSIS — R2242 Localized swelling, mass and lump, left lower limb: Secondary | ICD-10-CM | POA: Diagnosis not present

## 2016-06-23 HISTORY — DX: Chest pain, unspecified: R07.9

## 2016-06-23 HISTORY — DX: Abnormal result of other cardiovascular function study: R94.39

## 2016-06-25 ENCOUNTER — Inpatient Hospital Stay (HOSPITAL_COMMUNITY)
Admission: RE | Admit: 2016-06-25 | Discharge: 2016-06-25 | Disposition: A | Discharge status: Home / Self Care | Payer: Self-pay | Source: Ambulatory Visit

## 2016-06-25 DIAGNOSIS — E559 Vitamin D deficiency, unspecified: Secondary | ICD-10-CM | POA: Diagnosis not present

## 2016-06-25 DIAGNOSIS — I129 Hypertensive chronic kidney disease with stage 1 through stage 4 chronic kidney disease, or unspecified chronic kidney disease: Secondary | ICD-10-CM | POA: Diagnosis not present

## 2016-06-25 DIAGNOSIS — E1122 Type 2 diabetes mellitus with diabetic chronic kidney disease: Secondary | ICD-10-CM | POA: Diagnosis not present

## 2016-06-25 DIAGNOSIS — N183 Chronic kidney disease, stage 3 (moderate): Secondary | ICD-10-CM | POA: Diagnosis not present

## 2016-06-25 DIAGNOSIS — E889 Metabolic disorder, unspecified: Secondary | ICD-10-CM | POA: Diagnosis not present

## 2016-07-01 ENCOUNTER — Inpatient Hospital Stay (HOSPITAL_COMMUNITY): Admission: RE | Admit: 2016-07-01 | Payer: Medicare Other | Source: Ambulatory Visit | Admitting: Orthopedic Surgery

## 2016-07-01 ENCOUNTER — Encounter (HOSPITAL_COMMUNITY): Admission: RE | Payer: Self-pay | Source: Ambulatory Visit

## 2016-07-01 SURGERY — ARTHROPLASTY, HIP, TOTAL, ANTERIOR APPROACH
Anesthesia: Choice | Site: Hip | Laterality: Left

## 2016-07-07 DIAGNOSIS — K529 Noninfective gastroenteritis and colitis, unspecified: Secondary | ICD-10-CM | POA: Diagnosis not present

## 2016-07-07 DIAGNOSIS — E1129 Type 2 diabetes mellitus with other diabetic kidney complication: Secondary | ICD-10-CM | POA: Diagnosis not present

## 2016-07-07 DIAGNOSIS — N183 Chronic kidney disease, stage 3 (moderate): Secondary | ICD-10-CM | POA: Diagnosis not present

## 2016-07-07 DIAGNOSIS — E1165 Type 2 diabetes mellitus with hyperglycemia: Secondary | ICD-10-CM | POA: Diagnosis not present

## 2016-07-08 DIAGNOSIS — G4733 Obstructive sleep apnea (adult) (pediatric): Secondary | ICD-10-CM | POA: Diagnosis not present

## 2016-07-09 DIAGNOSIS — D649 Anemia, unspecified: Secondary | ICD-10-CM | POA: Diagnosis not present

## 2016-07-10 DIAGNOSIS — N183 Chronic kidney disease, stage 3 (moderate): Secondary | ICD-10-CM | POA: Diagnosis not present

## 2016-07-14 DIAGNOSIS — E119 Type 2 diabetes mellitus without complications: Secondary | ICD-10-CM | POA: Diagnosis not present

## 2016-07-14 DIAGNOSIS — I10 Essential (primary) hypertension: Secondary | ICD-10-CM | POA: Diagnosis not present

## 2016-07-14 DIAGNOSIS — I480 Paroxysmal atrial fibrillation: Secondary | ICD-10-CM | POA: Diagnosis not present

## 2016-07-14 DIAGNOSIS — E785 Hyperlipidemia, unspecified: Secondary | ICD-10-CM | POA: Diagnosis not present

## 2016-07-16 DIAGNOSIS — E875 Hyperkalemia: Secondary | ICD-10-CM | POA: Diagnosis not present

## 2016-08-02 HISTORY — PX: CORONARY ANGIOPLASTY WITH STENT PLACEMENT: SHX49

## 2016-08-04 DIAGNOSIS — I10 Essential (primary) hypertension: Secondary | ICD-10-CM | POA: Diagnosis not present

## 2016-08-04 DIAGNOSIS — N184 Chronic kidney disease, stage 4 (severe): Secondary | ICD-10-CM | POA: Diagnosis not present

## 2016-08-04 DIAGNOSIS — I480 Paroxysmal atrial fibrillation: Secondary | ICD-10-CM | POA: Diagnosis not present

## 2016-08-04 DIAGNOSIS — E785 Hyperlipidemia, unspecified: Secondary | ICD-10-CM | POA: Diagnosis not present

## 2016-08-04 DIAGNOSIS — E119 Type 2 diabetes mellitus without complications: Secondary | ICD-10-CM | POA: Diagnosis not present

## 2016-08-06 DIAGNOSIS — N185 Chronic kidney disease, stage 5: Secondary | ICD-10-CM | POA: Diagnosis not present

## 2016-08-06 DIAGNOSIS — I119 Hypertensive heart disease without heart failure: Secondary | ICD-10-CM | POA: Diagnosis not present

## 2016-08-06 DIAGNOSIS — I4581 Long QT syndrome: Secondary | ICD-10-CM | POA: Diagnosis not present

## 2016-08-06 DIAGNOSIS — R195 Other fecal abnormalities: Secondary | ICD-10-CM | POA: Diagnosis not present

## 2016-08-06 DIAGNOSIS — I340 Nonrheumatic mitral (valve) insufficiency: Secondary | ICD-10-CM | POA: Diagnosis not present

## 2016-08-06 DIAGNOSIS — R2681 Unsteadiness on feet: Secondary | ICD-10-CM | POA: Diagnosis not present

## 2016-08-06 DIAGNOSIS — I440 Atrioventricular block, first degree: Secondary | ICD-10-CM | POA: Diagnosis not present

## 2016-08-06 DIAGNOSIS — K3189 Other diseases of stomach and duodenum: Secondary | ICD-10-CM | POA: Diagnosis not present

## 2016-08-06 DIAGNOSIS — D62 Acute posthemorrhagic anemia: Secondary | ICD-10-CM | POA: Diagnosis not present

## 2016-08-06 DIAGNOSIS — D500 Iron deficiency anemia secondary to blood loss (chronic): Secondary | ICD-10-CM | POA: Diagnosis not present

## 2016-08-06 DIAGNOSIS — I5033 Acute on chronic diastolic (congestive) heart failure: Secondary | ICD-10-CM | POA: Diagnosis not present

## 2016-08-06 DIAGNOSIS — K2930 Chronic superficial gastritis without bleeding: Secondary | ICD-10-CM | POA: Diagnosis not present

## 2016-08-06 DIAGNOSIS — R410 Disorientation, unspecified: Secondary | ICD-10-CM | POA: Diagnosis not present

## 2016-08-06 DIAGNOSIS — I4891 Unspecified atrial fibrillation: Secondary | ICD-10-CM | POA: Diagnosis not present

## 2016-08-06 DIAGNOSIS — K635 Polyp of colon: Secondary | ICD-10-CM | POA: Diagnosis not present

## 2016-08-06 DIAGNOSIS — K633 Ulcer of intestine: Secondary | ICD-10-CM | POA: Diagnosis not present

## 2016-08-06 DIAGNOSIS — N170 Acute kidney failure with tubular necrosis: Secondary | ICD-10-CM | POA: Diagnosis not present

## 2016-08-06 DIAGNOSIS — I2510 Atherosclerotic heart disease of native coronary artery without angina pectoris: Secondary | ICD-10-CM | POA: Diagnosis not present

## 2016-08-06 DIAGNOSIS — Z452 Encounter for adjustment and management of vascular access device: Secondary | ICD-10-CM | POA: Diagnosis not present

## 2016-08-06 DIAGNOSIS — R600 Localized edema: Secondary | ICD-10-CM | POA: Diagnosis not present

## 2016-08-06 DIAGNOSIS — J9601 Acute respiratory failure with hypoxia: Secondary | ICD-10-CM | POA: Diagnosis not present

## 2016-08-06 DIAGNOSIS — R0602 Shortness of breath: Secondary | ICD-10-CM | POA: Diagnosis not present

## 2016-08-06 DIAGNOSIS — N289 Disorder of kidney and ureter, unspecified: Secondary | ICD-10-CM | POA: Diagnosis not present

## 2016-08-06 DIAGNOSIS — I214 Non-ST elevation (NSTEMI) myocardial infarction: Secondary | ICD-10-CM | POA: Insufficient documentation

## 2016-08-06 DIAGNOSIS — R531 Weakness: Secondary | ICD-10-CM | POA: Diagnosis not present

## 2016-08-06 DIAGNOSIS — K7200 Acute and subacute hepatic failure without coma: Secondary | ICD-10-CM | POA: Diagnosis not present

## 2016-08-06 DIAGNOSIS — R079 Chest pain, unspecified: Secondary | ICD-10-CM | POA: Diagnosis not present

## 2016-08-06 DIAGNOSIS — R9431 Abnormal electrocardiogram [ECG] [EKG]: Secondary | ICD-10-CM | POA: Diagnosis not present

## 2016-08-06 DIAGNOSIS — I480 Paroxysmal atrial fibrillation: Secondary | ICD-10-CM | POA: Diagnosis not present

## 2016-08-06 DIAGNOSIS — R404 Transient alteration of awareness: Secondary | ICD-10-CM | POA: Diagnosis not present

## 2016-08-06 DIAGNOSIS — N184 Chronic kidney disease, stage 4 (severe): Secondary | ICD-10-CM | POA: Diagnosis not present

## 2016-08-06 DIAGNOSIS — D649 Anemia, unspecified: Secondary | ICD-10-CM | POA: Diagnosis not present

## 2016-08-06 DIAGNOSIS — I120 Hypertensive chronic kidney disease with stage 5 chronic kidney disease or end stage renal disease: Secondary | ICD-10-CM | POA: Diagnosis not present

## 2016-08-06 DIAGNOSIS — I501 Left ventricular failure: Secondary | ICD-10-CM | POA: Diagnosis not present

## 2016-08-06 DIAGNOSIS — R42 Dizziness and giddiness: Secondary | ICD-10-CM | POA: Diagnosis not present

## 2016-08-06 DIAGNOSIS — E1122 Type 2 diabetes mellitus with diabetic chronic kidney disease: Secondary | ICD-10-CM | POA: Diagnosis not present

## 2016-08-06 DIAGNOSIS — E875 Hyperkalemia: Secondary | ICD-10-CM | POA: Diagnosis not present

## 2016-08-06 DIAGNOSIS — N179 Acute kidney failure, unspecified: Secondary | ICD-10-CM | POA: Diagnosis not present

## 2016-08-06 DIAGNOSIS — K922 Gastrointestinal hemorrhage, unspecified: Secondary | ICD-10-CM | POA: Insufficient documentation

## 2016-08-06 DIAGNOSIS — E876 Hypokalemia: Secondary | ICD-10-CM | POA: Diagnosis not present

## 2016-08-06 DIAGNOSIS — N186 End stage renal disease: Secondary | ICD-10-CM | POA: Diagnosis not present

## 2016-08-06 DIAGNOSIS — J189 Pneumonia, unspecified organism: Secondary | ICD-10-CM | POA: Diagnosis not present

## 2016-08-06 DIAGNOSIS — I517 Cardiomegaly: Secondary | ICD-10-CM | POA: Diagnosis not present

## 2016-08-06 DIAGNOSIS — D122 Benign neoplasm of ascending colon: Secondary | ICD-10-CM | POA: Diagnosis not present

## 2016-08-06 DIAGNOSIS — I25118 Atherosclerotic heart disease of native coronary artery with other forms of angina pectoris: Secondary | ICD-10-CM | POA: Diagnosis not present

## 2016-08-06 DIAGNOSIS — I248 Other forms of acute ischemic heart disease: Secondary | ICD-10-CM | POA: Diagnosis not present

## 2016-08-06 DIAGNOSIS — I129 Hypertensive chronic kidney disease with stage 1 through stage 4 chronic kidney disease, or unspecified chronic kidney disease: Secondary | ICD-10-CM | POA: Diagnosis not present

## 2016-08-06 DIAGNOSIS — R748 Abnormal levels of other serum enzymes: Secondary | ICD-10-CM | POA: Diagnosis not present

## 2016-08-06 DIAGNOSIS — I444 Left anterior fascicular block: Secondary | ICD-10-CM | POA: Diagnosis not present

## 2016-08-06 DIAGNOSIS — K2971 Gastritis, unspecified, with bleeding: Secondary | ICD-10-CM | POA: Diagnosis not present

## 2016-08-06 DIAGNOSIS — N2889 Other specified disorders of kidney and ureter: Secondary | ICD-10-CM | POA: Diagnosis not present

## 2016-08-06 DIAGNOSIS — I081 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: Secondary | ICD-10-CM | POA: Diagnosis not present

## 2016-08-06 DIAGNOSIS — E1165 Type 2 diabetes mellitus with hyperglycemia: Secondary | ICD-10-CM | POA: Diagnosis not present

## 2016-08-06 DIAGNOSIS — J90 Pleural effusion, not elsewhere classified: Secondary | ICD-10-CM | POA: Diagnosis not present

## 2016-08-06 DIAGNOSIS — Z7901 Long term (current) use of anticoagulants: Secondary | ICD-10-CM | POA: Diagnosis not present

## 2016-08-06 DIAGNOSIS — I5020 Unspecified systolic (congestive) heart failure: Secondary | ICD-10-CM | POA: Diagnosis not present

## 2016-08-06 DIAGNOSIS — Z992 Dependence on renal dialysis: Secondary | ICD-10-CM | POA: Diagnosis not present

## 2016-08-06 DIAGNOSIS — I132 Hypertensive heart and chronic kidney disease with heart failure and with stage 5 chronic kidney disease, or end stage renal disease: Secondary | ICD-10-CM | POA: Diagnosis not present

## 2016-08-06 DIAGNOSIS — K5731 Diverticulosis of large intestine without perforation or abscess with bleeding: Secondary | ICD-10-CM | POA: Diagnosis not present

## 2016-08-06 HISTORY — DX: Gastrointestinal hemorrhage, unspecified: K92.2

## 2016-08-06 HISTORY — DX: Non-ST elevation (NSTEMI) myocardial infarction: I21.4

## 2016-08-07 DIAGNOSIS — D500 Iron deficiency anemia secondary to blood loss (chronic): Secondary | ICD-10-CM | POA: Diagnosis not present

## 2016-08-07 DIAGNOSIS — R195 Other fecal abnormalities: Secondary | ICD-10-CM | POA: Diagnosis not present

## 2016-08-07 DIAGNOSIS — Z452 Encounter for adjustment and management of vascular access device: Secondary | ICD-10-CM | POA: Diagnosis not present

## 2016-08-07 DIAGNOSIS — I480 Paroxysmal atrial fibrillation: Secondary | ICD-10-CM | POA: Diagnosis not present

## 2016-08-07 DIAGNOSIS — N170 Acute kidney failure with tubular necrosis: Secondary | ICD-10-CM | POA: Diagnosis not present

## 2016-08-07 DIAGNOSIS — N184 Chronic kidney disease, stage 4 (severe): Secondary | ICD-10-CM | POA: Diagnosis not present

## 2016-08-07 DIAGNOSIS — I444 Left anterior fascicular block: Secondary | ICD-10-CM | POA: Diagnosis not present

## 2016-08-07 DIAGNOSIS — D649 Anemia, unspecified: Secondary | ICD-10-CM | POA: Diagnosis not present

## 2016-08-07 DIAGNOSIS — N185 Chronic kidney disease, stage 5: Secondary | ICD-10-CM | POA: Diagnosis not present

## 2016-08-07 DIAGNOSIS — R748 Abnormal levels of other serum enzymes: Secondary | ICD-10-CM | POA: Diagnosis not present

## 2016-08-07 DIAGNOSIS — D62 Acute posthemorrhagic anemia: Secondary | ICD-10-CM | POA: Diagnosis not present

## 2016-08-07 DIAGNOSIS — R0602 Shortness of breath: Secondary | ICD-10-CM | POA: Diagnosis not present

## 2016-08-07 DIAGNOSIS — I214 Non-ST elevation (NSTEMI) myocardial infarction: Secondary | ICD-10-CM | POA: Diagnosis not present

## 2016-08-07 DIAGNOSIS — R9431 Abnormal electrocardiogram [ECG] [EKG]: Secondary | ICD-10-CM | POA: Diagnosis not present

## 2016-08-07 DIAGNOSIS — I081 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: Secondary | ICD-10-CM | POA: Diagnosis not present

## 2016-08-07 DIAGNOSIS — I501 Left ventricular failure: Secondary | ICD-10-CM | POA: Diagnosis not present

## 2016-08-07 DIAGNOSIS — I119 Hypertensive heart disease without heart failure: Secondary | ICD-10-CM | POA: Diagnosis not present

## 2016-08-07 DIAGNOSIS — I440 Atrioventricular block, first degree: Secondary | ICD-10-CM | POA: Diagnosis not present

## 2016-08-07 DIAGNOSIS — I517 Cardiomegaly: Secondary | ICD-10-CM | POA: Diagnosis not present

## 2016-08-07 DIAGNOSIS — I25118 Atherosclerotic heart disease of native coronary artery with other forms of angina pectoris: Secondary | ICD-10-CM | POA: Diagnosis not present

## 2016-08-07 DIAGNOSIS — I4581 Long QT syndrome: Secondary | ICD-10-CM | POA: Diagnosis not present

## 2016-08-07 DIAGNOSIS — E875 Hyperkalemia: Secondary | ICD-10-CM | POA: Diagnosis not present

## 2016-08-08 DIAGNOSIS — R195 Other fecal abnormalities: Secondary | ICD-10-CM | POA: Diagnosis not present

## 2016-08-08 DIAGNOSIS — I214 Non-ST elevation (NSTEMI) myocardial infarction: Secondary | ICD-10-CM | POA: Diagnosis not present

## 2016-08-08 DIAGNOSIS — D649 Anemia, unspecified: Secondary | ICD-10-CM | POA: Diagnosis not present

## 2016-08-08 DIAGNOSIS — N184 Chronic kidney disease, stage 4 (severe): Secondary | ICD-10-CM | POA: Diagnosis not present

## 2016-08-08 DIAGNOSIS — N185 Chronic kidney disease, stage 5: Secondary | ICD-10-CM | POA: Diagnosis not present

## 2016-08-08 DIAGNOSIS — I501 Left ventricular failure: Secondary | ICD-10-CM | POA: Diagnosis not present

## 2016-08-08 DIAGNOSIS — Z7901 Long term (current) use of anticoagulants: Secondary | ICD-10-CM | POA: Diagnosis not present

## 2016-08-08 DIAGNOSIS — I340 Nonrheumatic mitral (valve) insufficiency: Secondary | ICD-10-CM | POA: Diagnosis not present

## 2016-08-08 DIAGNOSIS — N170 Acute kidney failure with tubular necrosis: Secondary | ICD-10-CM | POA: Diagnosis not present

## 2016-08-08 DIAGNOSIS — D62 Acute posthemorrhagic anemia: Secondary | ICD-10-CM | POA: Diagnosis not present

## 2016-08-08 DIAGNOSIS — I25118 Atherosclerotic heart disease of native coronary artery with other forms of angina pectoris: Secondary | ICD-10-CM | POA: Diagnosis not present

## 2016-08-08 DIAGNOSIS — E875 Hyperkalemia: Secondary | ICD-10-CM | POA: Diagnosis not present

## 2016-08-09 DIAGNOSIS — I501 Left ventricular failure: Secondary | ICD-10-CM | POA: Diagnosis not present

## 2016-08-09 DIAGNOSIS — D649 Anemia, unspecified: Secondary | ICD-10-CM | POA: Diagnosis not present

## 2016-08-09 DIAGNOSIS — R195 Other fecal abnormalities: Secondary | ICD-10-CM | POA: Diagnosis not present

## 2016-08-09 DIAGNOSIS — I214 Non-ST elevation (NSTEMI) myocardial infarction: Secondary | ICD-10-CM | POA: Diagnosis not present

## 2016-08-09 DIAGNOSIS — N184 Chronic kidney disease, stage 4 (severe): Secondary | ICD-10-CM | POA: Diagnosis not present

## 2016-08-09 DIAGNOSIS — N170 Acute kidney failure with tubular necrosis: Secondary | ICD-10-CM | POA: Diagnosis not present

## 2016-08-09 DIAGNOSIS — I25118 Atherosclerotic heart disease of native coronary artery with other forms of angina pectoris: Secondary | ICD-10-CM | POA: Diagnosis not present

## 2016-08-09 DIAGNOSIS — I4891 Unspecified atrial fibrillation: Secondary | ICD-10-CM | POA: Diagnosis not present

## 2016-08-09 DIAGNOSIS — Z7901 Long term (current) use of anticoagulants: Secondary | ICD-10-CM | POA: Diagnosis not present

## 2016-08-09 DIAGNOSIS — D62 Acute posthemorrhagic anemia: Secondary | ICD-10-CM | POA: Diagnosis not present

## 2016-08-10 DIAGNOSIS — R748 Abnormal levels of other serum enzymes: Secondary | ICD-10-CM | POA: Diagnosis not present

## 2016-08-10 DIAGNOSIS — N184 Chronic kidney disease, stage 4 (severe): Secondary | ICD-10-CM | POA: Diagnosis not present

## 2016-08-10 DIAGNOSIS — R195 Other fecal abnormalities: Secondary | ICD-10-CM | POA: Diagnosis not present

## 2016-08-10 DIAGNOSIS — D500 Iron deficiency anemia secondary to blood loss (chronic): Secondary | ICD-10-CM | POA: Diagnosis not present

## 2016-08-10 DIAGNOSIS — D62 Acute posthemorrhagic anemia: Secondary | ICD-10-CM | POA: Diagnosis not present

## 2016-08-10 DIAGNOSIS — I480 Paroxysmal atrial fibrillation: Secondary | ICD-10-CM | POA: Diagnosis not present

## 2016-08-10 DIAGNOSIS — N2889 Other specified disorders of kidney and ureter: Secondary | ICD-10-CM | POA: Diagnosis not present

## 2016-08-10 DIAGNOSIS — J9601 Acute respiratory failure with hypoxia: Secondary | ICD-10-CM | POA: Diagnosis not present

## 2016-08-10 DIAGNOSIS — J189 Pneumonia, unspecified organism: Secondary | ICD-10-CM | POA: Diagnosis not present

## 2016-08-10 DIAGNOSIS — I214 Non-ST elevation (NSTEMI) myocardial infarction: Secondary | ICD-10-CM | POA: Diagnosis not present

## 2016-08-10 DIAGNOSIS — R079 Chest pain, unspecified: Secondary | ICD-10-CM | POA: Diagnosis not present

## 2016-08-10 DIAGNOSIS — I5020 Unspecified systolic (congestive) heart failure: Secondary | ICD-10-CM | POA: Diagnosis not present

## 2016-08-10 DIAGNOSIS — N179 Acute kidney failure, unspecified: Secondary | ICD-10-CM | POA: Diagnosis not present

## 2016-08-11 DIAGNOSIS — J9601 Acute respiratory failure with hypoxia: Secondary | ICD-10-CM | POA: Diagnosis not present

## 2016-08-11 DIAGNOSIS — D62 Acute posthemorrhagic anemia: Secondary | ICD-10-CM | POA: Diagnosis not present

## 2016-08-11 DIAGNOSIS — R748 Abnormal levels of other serum enzymes: Secondary | ICD-10-CM | POA: Diagnosis not present

## 2016-08-11 DIAGNOSIS — N2889 Other specified disorders of kidney and ureter: Secondary | ICD-10-CM | POA: Diagnosis not present

## 2016-08-11 DIAGNOSIS — K922 Gastrointestinal hemorrhage, unspecified: Secondary | ICD-10-CM | POA: Diagnosis not present

## 2016-08-11 DIAGNOSIS — N184 Chronic kidney disease, stage 4 (severe): Secondary | ICD-10-CM | POA: Diagnosis not present

## 2016-08-11 DIAGNOSIS — I5020 Unspecified systolic (congestive) heart failure: Secondary | ICD-10-CM | POA: Diagnosis not present

## 2016-08-11 DIAGNOSIS — I214 Non-ST elevation (NSTEMI) myocardial infarction: Secondary | ICD-10-CM | POA: Diagnosis not present

## 2016-08-11 DIAGNOSIS — D649 Anemia, unspecified: Secondary | ICD-10-CM | POA: Diagnosis not present

## 2016-08-11 DIAGNOSIS — N179 Acute kidney failure, unspecified: Secondary | ICD-10-CM | POA: Diagnosis not present

## 2016-08-11 DIAGNOSIS — J189 Pneumonia, unspecified organism: Secondary | ICD-10-CM | POA: Diagnosis not present

## 2016-08-11 DIAGNOSIS — D500 Iron deficiency anemia secondary to blood loss (chronic): Secondary | ICD-10-CM | POA: Diagnosis not present

## 2016-08-11 DIAGNOSIS — R195 Other fecal abnormalities: Secondary | ICD-10-CM | POA: Diagnosis not present

## 2016-08-12 DIAGNOSIS — D122 Benign neoplasm of ascending colon: Secondary | ICD-10-CM | POA: Diagnosis not present

## 2016-08-12 DIAGNOSIS — I5020 Unspecified systolic (congestive) heart failure: Secondary | ICD-10-CM | POA: Diagnosis not present

## 2016-08-12 DIAGNOSIS — J9601 Acute respiratory failure with hypoxia: Secondary | ICD-10-CM | POA: Diagnosis not present

## 2016-08-12 DIAGNOSIS — N179 Acute kidney failure, unspecified: Secondary | ICD-10-CM | POA: Diagnosis not present

## 2016-08-12 DIAGNOSIS — K633 Ulcer of intestine: Secondary | ICD-10-CM | POA: Diagnosis not present

## 2016-08-12 DIAGNOSIS — I480 Paroxysmal atrial fibrillation: Secondary | ICD-10-CM | POA: Diagnosis not present

## 2016-08-12 DIAGNOSIS — I214 Non-ST elevation (NSTEMI) myocardial infarction: Secondary | ICD-10-CM | POA: Diagnosis not present

## 2016-08-12 DIAGNOSIS — K922 Gastrointestinal hemorrhage, unspecified: Secondary | ICD-10-CM | POA: Diagnosis not present

## 2016-08-12 DIAGNOSIS — I25118 Atherosclerotic heart disease of native coronary artery with other forms of angina pectoris: Secondary | ICD-10-CM | POA: Diagnosis not present

## 2016-08-12 DIAGNOSIS — K3189 Other diseases of stomach and duodenum: Secondary | ICD-10-CM | POA: Diagnosis not present

## 2016-08-12 DIAGNOSIS — K635 Polyp of colon: Secondary | ICD-10-CM | POA: Diagnosis not present

## 2016-08-12 DIAGNOSIS — I119 Hypertensive heart disease without heart failure: Secondary | ICD-10-CM | POA: Diagnosis not present

## 2016-08-12 DIAGNOSIS — J189 Pneumonia, unspecified organism: Secondary | ICD-10-CM | POA: Diagnosis not present

## 2016-08-12 DIAGNOSIS — K2930 Chronic superficial gastritis without bleeding: Secondary | ICD-10-CM | POA: Diagnosis not present

## 2016-08-13 DIAGNOSIS — I480 Paroxysmal atrial fibrillation: Secondary | ICD-10-CM | POA: Diagnosis not present

## 2016-08-13 DIAGNOSIS — R600 Localized edema: Secondary | ICD-10-CM | POA: Diagnosis not present

## 2016-08-13 DIAGNOSIS — N179 Acute kidney failure, unspecified: Secondary | ICD-10-CM | POA: Diagnosis not present

## 2016-08-13 DIAGNOSIS — I5020 Unspecified systolic (congestive) heart failure: Secondary | ICD-10-CM | POA: Diagnosis not present

## 2016-08-13 DIAGNOSIS — N184 Chronic kidney disease, stage 4 (severe): Secondary | ICD-10-CM | POA: Diagnosis not present

## 2016-08-13 DIAGNOSIS — R0602 Shortness of breath: Secondary | ICD-10-CM | POA: Diagnosis not present

## 2016-08-13 DIAGNOSIS — J90 Pleural effusion, not elsewhere classified: Secondary | ICD-10-CM | POA: Diagnosis not present

## 2016-08-13 DIAGNOSIS — I214 Non-ST elevation (NSTEMI) myocardial infarction: Secondary | ICD-10-CM | POA: Diagnosis not present

## 2016-08-14 DIAGNOSIS — N179 Acute kidney failure, unspecified: Secondary | ICD-10-CM | POA: Diagnosis not present

## 2016-08-14 DIAGNOSIS — I5020 Unspecified systolic (congestive) heart failure: Secondary | ICD-10-CM | POA: Diagnosis not present

## 2016-08-14 DIAGNOSIS — I214 Non-ST elevation (NSTEMI) myocardial infarction: Secondary | ICD-10-CM | POA: Diagnosis not present

## 2016-08-14 DIAGNOSIS — N184 Chronic kidney disease, stage 4 (severe): Secondary | ICD-10-CM | POA: Diagnosis not present

## 2016-08-14 DIAGNOSIS — I25118 Atherosclerotic heart disease of native coronary artery with other forms of angina pectoris: Secondary | ICD-10-CM | POA: Diagnosis not present

## 2016-08-14 DIAGNOSIS — I480 Paroxysmal atrial fibrillation: Secondary | ICD-10-CM | POA: Diagnosis not present

## 2016-08-14 DIAGNOSIS — N2889 Other specified disorders of kidney and ureter: Secondary | ICD-10-CM | POA: Diagnosis not present

## 2016-08-15 DIAGNOSIS — N170 Acute kidney failure with tubular necrosis: Secondary | ICD-10-CM | POA: Diagnosis not present

## 2016-08-15 DIAGNOSIS — R195 Other fecal abnormalities: Secondary | ICD-10-CM | POA: Diagnosis not present

## 2016-08-15 DIAGNOSIS — I214 Non-ST elevation (NSTEMI) myocardial infarction: Secondary | ICD-10-CM | POA: Diagnosis not present

## 2016-08-15 DIAGNOSIS — D649 Anemia, unspecified: Secondary | ICD-10-CM | POA: Diagnosis not present

## 2016-08-16 DIAGNOSIS — I214 Non-ST elevation (NSTEMI) myocardial infarction: Secondary | ICD-10-CM | POA: Diagnosis not present

## 2016-08-16 DIAGNOSIS — R195 Other fecal abnormalities: Secondary | ICD-10-CM | POA: Diagnosis not present

## 2016-08-16 DIAGNOSIS — D649 Anemia, unspecified: Secondary | ICD-10-CM | POA: Diagnosis not present

## 2016-08-16 DIAGNOSIS — N170 Acute kidney failure with tubular necrosis: Secondary | ICD-10-CM | POA: Diagnosis not present

## 2016-08-17 DIAGNOSIS — D649 Anemia, unspecified: Secondary | ICD-10-CM | POA: Diagnosis not present

## 2016-08-17 DIAGNOSIS — R195 Other fecal abnormalities: Secondary | ICD-10-CM | POA: Diagnosis not present

## 2016-08-17 DIAGNOSIS — N170 Acute kidney failure with tubular necrosis: Secondary | ICD-10-CM | POA: Diagnosis not present

## 2016-08-17 DIAGNOSIS — I214 Non-ST elevation (NSTEMI) myocardial infarction: Secondary | ICD-10-CM | POA: Diagnosis not present

## 2016-08-18 DIAGNOSIS — R195 Other fecal abnormalities: Secondary | ICD-10-CM | POA: Diagnosis not present

## 2016-08-18 DIAGNOSIS — I214 Non-ST elevation (NSTEMI) myocardial infarction: Secondary | ICD-10-CM | POA: Diagnosis not present

## 2016-08-18 DIAGNOSIS — N170 Acute kidney failure with tubular necrosis: Secondary | ICD-10-CM | POA: Diagnosis not present

## 2016-08-18 DIAGNOSIS — D649 Anemia, unspecified: Secondary | ICD-10-CM | POA: Diagnosis not present

## 2016-08-18 DIAGNOSIS — R42 Dizziness and giddiness: Secondary | ICD-10-CM | POA: Diagnosis not present

## 2016-08-18 DIAGNOSIS — J90 Pleural effusion, not elsewhere classified: Secondary | ICD-10-CM | POA: Diagnosis not present

## 2016-08-18 DIAGNOSIS — N184 Chronic kidney disease, stage 4 (severe): Secondary | ICD-10-CM | POA: Diagnosis not present

## 2016-08-19 DIAGNOSIS — Z992 Dependence on renal dialysis: Secondary | ICD-10-CM | POA: Diagnosis not present

## 2016-08-19 DIAGNOSIS — J189 Pneumonia, unspecified organism: Secondary | ICD-10-CM | POA: Diagnosis not present

## 2016-08-19 DIAGNOSIS — R42 Dizziness and giddiness: Secondary | ICD-10-CM | POA: Diagnosis not present

## 2016-08-19 DIAGNOSIS — R410 Disorientation, unspecified: Secondary | ICD-10-CM | POA: Diagnosis not present

## 2016-08-19 DIAGNOSIS — J90 Pleural effusion, not elsewhere classified: Secondary | ICD-10-CM | POA: Diagnosis not present

## 2016-08-19 DIAGNOSIS — E1122 Type 2 diabetes mellitus with diabetic chronic kidney disease: Secondary | ICD-10-CM | POA: Diagnosis not present

## 2016-08-19 DIAGNOSIS — I120 Hypertensive chronic kidney disease with stage 5 chronic kidney disease or end stage renal disease: Secondary | ICD-10-CM | POA: Diagnosis not present

## 2016-08-19 DIAGNOSIS — D649 Anemia, unspecified: Secondary | ICD-10-CM | POA: Diagnosis not present

## 2016-08-19 DIAGNOSIS — I214 Non-ST elevation (NSTEMI) myocardial infarction: Secondary | ICD-10-CM | POA: Diagnosis not present

## 2016-08-19 DIAGNOSIS — N186 End stage renal disease: Secondary | ICD-10-CM | POA: Diagnosis not present

## 2016-08-19 DIAGNOSIS — N179 Acute kidney failure, unspecified: Secondary | ICD-10-CM | POA: Diagnosis not present

## 2016-08-20 DIAGNOSIS — N179 Acute kidney failure, unspecified: Secondary | ICD-10-CM | POA: Diagnosis not present

## 2016-08-20 DIAGNOSIS — I214 Non-ST elevation (NSTEMI) myocardial infarction: Secondary | ICD-10-CM | POA: Diagnosis not present

## 2016-08-20 DIAGNOSIS — D649 Anemia, unspecified: Secondary | ICD-10-CM | POA: Diagnosis not present

## 2016-08-20 DIAGNOSIS — N184 Chronic kidney disease, stage 4 (severe): Secondary | ICD-10-CM | POA: Diagnosis not present

## 2016-08-20 DIAGNOSIS — J189 Pneumonia, unspecified organism: Secondary | ICD-10-CM | POA: Diagnosis not present

## 2016-08-20 DIAGNOSIS — R195 Other fecal abnormalities: Secondary | ICD-10-CM | POA: Diagnosis not present

## 2016-08-21 DIAGNOSIS — J189 Pneumonia, unspecified organism: Secondary | ICD-10-CM | POA: Diagnosis not present

## 2016-08-21 DIAGNOSIS — D649 Anemia, unspecified: Secondary | ICD-10-CM | POA: Diagnosis not present

## 2016-08-21 DIAGNOSIS — N179 Acute kidney failure, unspecified: Secondary | ICD-10-CM | POA: Diagnosis not present

## 2016-08-21 DIAGNOSIS — I214 Non-ST elevation (NSTEMI) myocardial infarction: Secondary | ICD-10-CM | POA: Diagnosis not present

## 2016-08-21 DIAGNOSIS — R195 Other fecal abnormalities: Secondary | ICD-10-CM | POA: Diagnosis not present

## 2016-08-21 DIAGNOSIS — N184 Chronic kidney disease, stage 4 (severe): Secondary | ICD-10-CM | POA: Diagnosis not present

## 2016-08-22 DIAGNOSIS — I214 Non-ST elevation (NSTEMI) myocardial infarction: Secondary | ICD-10-CM | POA: Diagnosis not present

## 2016-08-22 DIAGNOSIS — R2681 Unsteadiness on feet: Secondary | ICD-10-CM | POA: Diagnosis not present

## 2016-08-22 DIAGNOSIS — J90 Pleural effusion, not elsewhere classified: Secondary | ICD-10-CM | POA: Diagnosis not present

## 2016-08-22 DIAGNOSIS — D649 Anemia, unspecified: Secondary | ICD-10-CM | POA: Diagnosis not present

## 2016-08-22 DIAGNOSIS — E876 Hypokalemia: Secondary | ICD-10-CM | POA: Diagnosis not present

## 2016-08-22 DIAGNOSIS — N179 Acute kidney failure, unspecified: Secondary | ICD-10-CM | POA: Diagnosis not present

## 2016-08-22 DIAGNOSIS — N184 Chronic kidney disease, stage 4 (severe): Secondary | ICD-10-CM | POA: Diagnosis not present

## 2016-08-22 DIAGNOSIS — J189 Pneumonia, unspecified organism: Secondary | ICD-10-CM | POA: Diagnosis not present

## 2016-08-23 DIAGNOSIS — J189 Pneumonia, unspecified organism: Secondary | ICD-10-CM | POA: Diagnosis not present

## 2016-08-23 DIAGNOSIS — I214 Non-ST elevation (NSTEMI) myocardial infarction: Secondary | ICD-10-CM | POA: Diagnosis not present

## 2016-08-23 DIAGNOSIS — N179 Acute kidney failure, unspecified: Secondary | ICD-10-CM | POA: Diagnosis not present

## 2016-08-23 DIAGNOSIS — D649 Anemia, unspecified: Secondary | ICD-10-CM | POA: Diagnosis not present

## 2016-08-23 DIAGNOSIS — N184 Chronic kidney disease, stage 4 (severe): Secondary | ICD-10-CM | POA: Diagnosis not present

## 2016-08-23 DIAGNOSIS — E876 Hypokalemia: Secondary | ICD-10-CM | POA: Diagnosis not present

## 2016-08-25 DIAGNOSIS — I214 Non-ST elevation (NSTEMI) myocardial infarction: Secondary | ICD-10-CM | POA: Diagnosis not present

## 2016-08-25 DIAGNOSIS — N184 Chronic kidney disease, stage 4 (severe): Secondary | ICD-10-CM | POA: Diagnosis not present

## 2016-08-25 DIAGNOSIS — I130 Hypertensive heart and chronic kidney disease with heart failure and stage 1 through stage 4 chronic kidney disease, or unspecified chronic kidney disease: Secondary | ICD-10-CM | POA: Diagnosis not present

## 2016-08-25 DIAGNOSIS — I5023 Acute on chronic systolic (congestive) heart failure: Secondary | ICD-10-CM | POA: Diagnosis not present

## 2016-08-25 DIAGNOSIS — D62 Acute posthemorrhagic anemia: Secondary | ICD-10-CM | POA: Diagnosis not present

## 2016-08-25 DIAGNOSIS — Z48812 Encounter for surgical aftercare following surgery on the circulatory system: Secondary | ICD-10-CM | POA: Diagnosis not present

## 2016-08-25 DIAGNOSIS — I25119 Atherosclerotic heart disease of native coronary artery with unspecified angina pectoris: Secondary | ICD-10-CM | POA: Diagnosis not present

## 2016-08-25 DIAGNOSIS — E1122 Type 2 diabetes mellitus with diabetic chronic kidney disease: Secondary | ICD-10-CM | POA: Diagnosis not present

## 2016-08-25 DIAGNOSIS — K922 Gastrointestinal hemorrhage, unspecified: Secondary | ICD-10-CM | POA: Diagnosis not present

## 2016-08-25 DIAGNOSIS — I480 Paroxysmal atrial fibrillation: Secondary | ICD-10-CM | POA: Diagnosis not present

## 2016-08-26 DIAGNOSIS — K759 Inflammatory liver disease, unspecified: Secondary | ICD-10-CM | POA: Diagnosis not present

## 2016-08-26 DIAGNOSIS — R0902 Hypoxemia: Secondary | ICD-10-CM | POA: Diagnosis not present

## 2016-08-26 DIAGNOSIS — J9601 Acute respiratory failure with hypoxia: Secondary | ICD-10-CM | POA: Diagnosis not present

## 2016-08-26 DIAGNOSIS — R112 Nausea with vomiting, unspecified: Secondary | ICD-10-CM | POA: Diagnosis not present

## 2016-08-26 DIAGNOSIS — E1129 Type 2 diabetes mellitus with other diabetic kidney complication: Secondary | ICD-10-CM | POA: Diagnosis not present

## 2016-08-26 DIAGNOSIS — I252 Old myocardial infarction: Secondary | ICD-10-CM | POA: Diagnosis not present

## 2016-08-26 DIAGNOSIS — D649 Anemia, unspecified: Secondary | ICD-10-CM | POA: Diagnosis not present

## 2016-08-26 DIAGNOSIS — J90 Pleural effusion, not elsewhere classified: Secondary | ICD-10-CM | POA: Diagnosis not present

## 2016-08-26 DIAGNOSIS — G4733 Obstructive sleep apnea (adult) (pediatric): Secondary | ICD-10-CM | POA: Diagnosis not present

## 2016-08-26 DIAGNOSIS — I5033 Acute on chronic diastolic (congestive) heart failure: Secondary | ICD-10-CM | POA: Diagnosis not present

## 2016-08-26 DIAGNOSIS — E1165 Type 2 diabetes mellitus with hyperglycemia: Secondary | ICD-10-CM | POA: Diagnosis not present

## 2016-08-26 DIAGNOSIS — J9611 Chronic respiratory failure with hypoxia: Secondary | ICD-10-CM | POA: Diagnosis not present

## 2016-08-26 DIAGNOSIS — D6489 Other specified anemias: Secondary | ICD-10-CM | POA: Diagnosis not present

## 2016-08-26 DIAGNOSIS — N170 Acute kidney failure with tubular necrosis: Secondary | ICD-10-CM | POA: Diagnosis not present

## 2016-08-26 DIAGNOSIS — R0602 Shortness of breath: Secondary | ICD-10-CM | POA: Diagnosis not present

## 2016-08-26 DIAGNOSIS — N183 Chronic kidney disease, stage 3 (moderate): Secondary | ICD-10-CM | POA: Diagnosis not present

## 2016-08-26 DIAGNOSIS — J189 Pneumonia, unspecified organism: Secondary | ICD-10-CM | POA: Diagnosis not present

## 2016-08-26 DIAGNOSIS — I482 Chronic atrial fibrillation: Secondary | ICD-10-CM | POA: Diagnosis not present

## 2016-08-26 DIAGNOSIS — N184 Chronic kidney disease, stage 4 (severe): Secondary | ICD-10-CM | POA: Diagnosis not present

## 2016-08-27 DIAGNOSIS — J90 Pleural effusion, not elsewhere classified: Secondary | ICD-10-CM | POA: Diagnosis not present

## 2016-08-27 DIAGNOSIS — I4891 Unspecified atrial fibrillation: Secondary | ICD-10-CM | POA: Diagnosis not present

## 2016-08-27 DIAGNOSIS — J9610 Chronic respiratory failure, unspecified whether with hypoxia or hypercapnia: Secondary | ICD-10-CM | POA: Diagnosis not present

## 2016-08-27 DIAGNOSIS — J9601 Acute respiratory failure with hypoxia: Secondary | ICD-10-CM | POA: Diagnosis not present

## 2016-08-27 DIAGNOSIS — J9600 Acute respiratory failure, unspecified whether with hypoxia or hypercapnia: Secondary | ICD-10-CM | POA: Diagnosis not present

## 2016-08-27 DIAGNOSIS — I130 Hypertensive heart and chronic kidney disease with heart failure and stage 1 through stage 4 chronic kidney disease, or unspecified chronic kidney disease: Secondary | ICD-10-CM | POA: Diagnosis not present

## 2016-08-27 DIAGNOSIS — N183 Chronic kidney disease, stage 3 (moderate): Secondary | ICD-10-CM | POA: Diagnosis not present

## 2016-08-27 DIAGNOSIS — I509 Heart failure, unspecified: Secondary | ICD-10-CM | POA: Diagnosis not present

## 2016-08-27 DIAGNOSIS — J9621 Acute and chronic respiratory failure with hypoxia: Secondary | ICD-10-CM | POA: Diagnosis not present

## 2016-08-27 DIAGNOSIS — I2510 Atherosclerotic heart disease of native coronary artery without angina pectoris: Secondary | ICD-10-CM | POA: Diagnosis not present

## 2016-08-27 DIAGNOSIS — R0902 Hypoxemia: Secondary | ICD-10-CM | POA: Diagnosis not present

## 2016-08-27 DIAGNOSIS — N189 Chronic kidney disease, unspecified: Secondary | ICD-10-CM | POA: Diagnosis not present

## 2016-08-27 DIAGNOSIS — E1165 Type 2 diabetes mellitus with hyperglycemia: Secondary | ICD-10-CM | POA: Diagnosis not present

## 2016-08-27 DIAGNOSIS — R0602 Shortness of breath: Secondary | ICD-10-CM | POA: Diagnosis not present

## 2016-08-27 DIAGNOSIS — N179 Acute kidney failure, unspecified: Secondary | ICD-10-CM | POA: Diagnosis not present

## 2016-08-27 DIAGNOSIS — D649 Anemia, unspecified: Secondary | ICD-10-CM | POA: Diagnosis not present

## 2016-08-27 DIAGNOSIS — D6489 Other specified anemias: Secondary | ICD-10-CM | POA: Diagnosis not present

## 2016-08-27 DIAGNOSIS — G4733 Obstructive sleep apnea (adult) (pediatric): Secondary | ICD-10-CM | POA: Diagnosis not present

## 2016-08-27 DIAGNOSIS — I5023 Acute on chronic systolic (congestive) heart failure: Secondary | ICD-10-CM | POA: Diagnosis not present

## 2016-08-28 DIAGNOSIS — I5023 Acute on chronic systolic (congestive) heart failure: Secondary | ICD-10-CM | POA: Diagnosis not present

## 2016-08-28 DIAGNOSIS — D649 Anemia, unspecified: Secondary | ICD-10-CM | POA: Diagnosis not present

## 2016-08-28 DIAGNOSIS — N183 Chronic kidney disease, stage 3 (moderate): Secondary | ICD-10-CM | POA: Diagnosis not present

## 2016-08-28 DIAGNOSIS — J9610 Chronic respiratory failure, unspecified whether with hypoxia or hypercapnia: Secondary | ICD-10-CM | POA: Diagnosis not present

## 2016-08-28 DIAGNOSIS — J9600 Acute respiratory failure, unspecified whether with hypoxia or hypercapnia: Secondary | ICD-10-CM | POA: Diagnosis not present

## 2016-08-28 DIAGNOSIS — G4733 Obstructive sleep apnea (adult) (pediatric): Secondary | ICD-10-CM | POA: Diagnosis not present

## 2016-09-01 DIAGNOSIS — N184 Chronic kidney disease, stage 4 (severe): Secondary | ICD-10-CM | POA: Diagnosis not present

## 2016-09-01 DIAGNOSIS — I129 Hypertensive chronic kidney disease with stage 1 through stage 4 chronic kidney disease, or unspecified chronic kidney disease: Secondary | ICD-10-CM | POA: Diagnosis not present

## 2016-09-01 DIAGNOSIS — N179 Acute kidney failure, unspecified: Secondary | ICD-10-CM | POA: Diagnosis not present

## 2016-09-01 DIAGNOSIS — D631 Anemia in chronic kidney disease: Secondary | ICD-10-CM | POA: Diagnosis not present

## 2016-09-01 HISTORY — DX: Acute kidney failure, unspecified: N17.9

## 2016-09-02 DIAGNOSIS — E785 Hyperlipidemia, unspecified: Secondary | ICD-10-CM | POA: Diagnosis not present

## 2016-09-02 DIAGNOSIS — N289 Disorder of kidney and ureter, unspecified: Secondary | ICD-10-CM

## 2016-09-02 DIAGNOSIS — I10 Essential (primary) hypertension: Secondary | ICD-10-CM | POA: Diagnosis not present

## 2016-09-02 DIAGNOSIS — I519 Heart disease, unspecified: Secondary | ICD-10-CM

## 2016-09-02 DIAGNOSIS — E1122 Type 2 diabetes mellitus with diabetic chronic kidney disease: Secondary | ICD-10-CM | POA: Diagnosis not present

## 2016-09-02 DIAGNOSIS — I214 Non-ST elevation (NSTEMI) myocardial infarction: Secondary | ICD-10-CM | POA: Diagnosis not present

## 2016-09-02 HISTORY — DX: Disorder of kidney and ureter, unspecified: N28.9

## 2016-09-02 HISTORY — DX: Heart disease, unspecified: I51.9

## 2016-09-03 DIAGNOSIS — I5023 Acute on chronic systolic (congestive) heart failure: Secondary | ICD-10-CM | POA: Diagnosis not present

## 2016-09-03 DIAGNOSIS — I482 Chronic atrial fibrillation: Secondary | ICD-10-CM | POA: Diagnosis not present

## 2016-09-03 DIAGNOSIS — J9621 Acute and chronic respiratory failure with hypoxia: Secondary | ICD-10-CM | POA: Diagnosis not present

## 2016-09-03 DIAGNOSIS — N183 Chronic kidney disease, stage 3 (moderate): Secondary | ICD-10-CM | POA: Diagnosis not present

## 2016-09-07 DIAGNOSIS — N184 Chronic kidney disease, stage 4 (severe): Secondary | ICD-10-CM | POA: Diagnosis not present

## 2016-09-07 DIAGNOSIS — J189 Pneumonia, unspecified organism: Secondary | ICD-10-CM | POA: Diagnosis not present

## 2016-09-07 DIAGNOSIS — E1122 Type 2 diabetes mellitus with diabetic chronic kidney disease: Secondary | ICD-10-CM | POA: Diagnosis not present

## 2016-09-07 DIAGNOSIS — E559 Vitamin D deficiency, unspecified: Secondary | ICD-10-CM | POA: Diagnosis not present

## 2016-09-07 DIAGNOSIS — J9621 Acute and chronic respiratory failure with hypoxia: Secondary | ICD-10-CM | POA: Diagnosis not present

## 2016-09-08 DIAGNOSIS — I252 Old myocardial infarction: Secondary | ICD-10-CM | POA: Diagnosis not present

## 2016-09-08 DIAGNOSIS — J9611 Chronic respiratory failure with hypoxia: Secondary | ICD-10-CM | POA: Diagnosis not present

## 2016-09-08 DIAGNOSIS — I4891 Unspecified atrial fibrillation: Secondary | ICD-10-CM | POA: Diagnosis not present

## 2016-09-08 DIAGNOSIS — I480 Paroxysmal atrial fibrillation: Secondary | ICD-10-CM | POA: Diagnosis not present

## 2016-09-08 DIAGNOSIS — I130 Hypertensive heart and chronic kidney disease with heart failure and stage 1 through stage 4 chronic kidney disease, or unspecified chronic kidney disease: Secondary | ICD-10-CM | POA: Diagnosis not present

## 2016-09-08 DIAGNOSIS — I2510 Atherosclerotic heart disease of native coronary artery without angina pectoris: Secondary | ICD-10-CM | POA: Diagnosis not present

## 2016-09-08 DIAGNOSIS — J90 Pleural effusion, not elsewhere classified: Secondary | ICD-10-CM | POA: Diagnosis not present

## 2016-09-08 DIAGNOSIS — N3000 Acute cystitis without hematuria: Secondary | ICD-10-CM | POA: Diagnosis not present

## 2016-09-08 DIAGNOSIS — N390 Urinary tract infection, site not specified: Secondary | ICD-10-CM | POA: Diagnosis not present

## 2016-09-08 DIAGNOSIS — I6789 Other cerebrovascular disease: Secondary | ICD-10-CM | POA: Diagnosis not present

## 2016-09-08 DIAGNOSIS — I25110 Atherosclerotic heart disease of native coronary artery with unstable angina pectoris: Secondary | ICD-10-CM | POA: Diagnosis not present

## 2016-09-08 DIAGNOSIS — I214 Non-ST elevation (NSTEMI) myocardial infarction: Secondary | ICD-10-CM | POA: Diagnosis not present

## 2016-09-08 DIAGNOSIS — I5022 Chronic systolic (congestive) heart failure: Secondary | ICD-10-CM | POA: Diagnosis not present

## 2016-09-08 DIAGNOSIS — N184 Chronic kidney disease, stage 4 (severe): Secondary | ICD-10-CM | POA: Diagnosis not present

## 2016-09-08 DIAGNOSIS — R251 Tremor, unspecified: Secondary | ICD-10-CM | POA: Diagnosis not present

## 2016-09-08 DIAGNOSIS — J9612 Chronic respiratory failure with hypercapnia: Secondary | ICD-10-CM | POA: Diagnosis not present

## 2016-09-08 DIAGNOSIS — G9341 Metabolic encephalopathy: Secondary | ICD-10-CM | POA: Diagnosis not present

## 2016-09-09 DIAGNOSIS — N184 Chronic kidney disease, stage 4 (severe): Secondary | ICD-10-CM | POA: Diagnosis not present

## 2016-09-09 DIAGNOSIS — I5022 Chronic systolic (congestive) heart failure: Secondary | ICD-10-CM | POA: Diagnosis not present

## 2016-09-09 DIAGNOSIS — G9341 Metabolic encephalopathy: Secondary | ICD-10-CM | POA: Diagnosis not present

## 2016-09-09 DIAGNOSIS — J90 Pleural effusion, not elsewhere classified: Secondary | ICD-10-CM | POA: Diagnosis not present

## 2016-09-10 DIAGNOSIS — I5022 Chronic systolic (congestive) heart failure: Secondary | ICD-10-CM | POA: Diagnosis not present

## 2016-09-10 DIAGNOSIS — G9341 Metabolic encephalopathy: Secondary | ICD-10-CM | POA: Diagnosis not present

## 2016-09-10 DIAGNOSIS — N184 Chronic kidney disease, stage 4 (severe): Secondary | ICD-10-CM | POA: Diagnosis not present

## 2016-09-10 DIAGNOSIS — J90 Pleural effusion, not elsewhere classified: Secondary | ICD-10-CM | POA: Diagnosis not present

## 2016-09-10 DIAGNOSIS — I214 Non-ST elevation (NSTEMI) myocardial infarction: Secondary | ICD-10-CM | POA: Diagnosis not present

## 2016-09-11 DIAGNOSIS — D649 Anemia, unspecified: Secondary | ICD-10-CM | POA: Diagnosis not present

## 2016-09-11 DIAGNOSIS — I5032 Chronic diastolic (congestive) heart failure: Secondary | ICD-10-CM | POA: Diagnosis not present

## 2016-09-11 DIAGNOSIS — N184 Chronic kidney disease, stage 4 (severe): Secondary | ICD-10-CM | POA: Diagnosis not present

## 2016-09-11 DIAGNOSIS — M7989 Other specified soft tissue disorders: Secondary | ICD-10-CM | POA: Diagnosis not present

## 2016-09-11 DIAGNOSIS — I2510 Atherosclerotic heart disease of native coronary artery without angina pectoris: Secondary | ICD-10-CM | POA: Diagnosis not present

## 2016-09-11 DIAGNOSIS — N3000 Acute cystitis without hematuria: Secondary | ICD-10-CM | POA: Diagnosis not present

## 2016-09-11 DIAGNOSIS — J90 Pleural effusion, not elsewhere classified: Secondary | ICD-10-CM | POA: Diagnosis not present

## 2016-09-11 DIAGNOSIS — N390 Urinary tract infection, site not specified: Secondary | ICD-10-CM | POA: Diagnosis not present

## 2016-09-11 DIAGNOSIS — I5022 Chronic systolic (congestive) heart failure: Secondary | ICD-10-CM | POA: Diagnosis not present

## 2016-09-11 DIAGNOSIS — G9341 Metabolic encephalopathy: Secondary | ICD-10-CM | POA: Diagnosis not present

## 2016-09-11 DIAGNOSIS — N183 Chronic kidney disease, stage 3 (moderate): Secondary | ICD-10-CM | POA: Diagnosis not present

## 2016-09-11 DIAGNOSIS — R262 Difficulty in walking, not elsewhere classified: Secondary | ICD-10-CM | POA: Diagnosis not present

## 2016-09-15 DIAGNOSIS — D649 Anemia, unspecified: Secondary | ICD-10-CM | POA: Diagnosis not present

## 2016-09-15 DIAGNOSIS — I5032 Chronic diastolic (congestive) heart failure: Secondary | ICD-10-CM | POA: Diagnosis not present

## 2016-09-15 DIAGNOSIS — N183 Chronic kidney disease, stage 3 (moderate): Secondary | ICD-10-CM | POA: Diagnosis not present

## 2016-09-15 DIAGNOSIS — R262 Difficulty in walking, not elsewhere classified: Secondary | ICD-10-CM | POA: Diagnosis not present

## 2016-09-21 DIAGNOSIS — M7989 Other specified soft tissue disorders: Secondary | ICD-10-CM | POA: Diagnosis not present

## 2016-09-21 DIAGNOSIS — N184 Chronic kidney disease, stage 4 (severe): Secondary | ICD-10-CM | POA: Diagnosis not present

## 2016-09-22 DIAGNOSIS — D631 Anemia in chronic kidney disease: Secondary | ICD-10-CM | POA: Diagnosis not present

## 2016-09-22 DIAGNOSIS — I129 Hypertensive chronic kidney disease with stage 1 through stage 4 chronic kidney disease, or unspecified chronic kidney disease: Secondary | ICD-10-CM | POA: Diagnosis not present

## 2016-09-22 DIAGNOSIS — E1122 Type 2 diabetes mellitus with diabetic chronic kidney disease: Secondary | ICD-10-CM | POA: Diagnosis not present

## 2016-09-22 DIAGNOSIS — N184 Chronic kidney disease, stage 4 (severe): Secondary | ICD-10-CM | POA: Diagnosis not present

## 2016-09-24 DIAGNOSIS — M7989 Other specified soft tissue disorders: Secondary | ICD-10-CM | POA: Diagnosis not present

## 2016-09-25 DIAGNOSIS — M25572 Pain in left ankle and joints of left foot: Secondary | ICD-10-CM | POA: Diagnosis not present

## 2016-09-25 DIAGNOSIS — M79672 Pain in left foot: Secondary | ICD-10-CM | POA: Diagnosis not present

## 2016-09-28 DIAGNOSIS — G4733 Obstructive sleep apnea (adult) (pediatric): Secondary | ICD-10-CM | POA: Diagnosis not present

## 2016-09-28 DIAGNOSIS — J9610 Chronic respiratory failure, unspecified whether with hypoxia or hypercapnia: Secondary | ICD-10-CM | POA: Diagnosis not present

## 2016-09-28 DIAGNOSIS — N184 Chronic kidney disease, stage 4 (severe): Secondary | ICD-10-CM | POA: Diagnosis not present

## 2016-09-29 DIAGNOSIS — R399 Unspecified symptoms and signs involving the genitourinary system: Secondary | ICD-10-CM | POA: Diagnosis not present

## 2016-09-30 DIAGNOSIS — I110 Hypertensive heart disease with heart failure: Secondary | ICD-10-CM | POA: Diagnosis not present

## 2016-09-30 DIAGNOSIS — G4733 Obstructive sleep apnea (adult) (pediatric): Secondary | ICD-10-CM | POA: Diagnosis not present

## 2016-09-30 DIAGNOSIS — I2510 Atherosclerotic heart disease of native coronary artery without angina pectoris: Secondary | ICD-10-CM | POA: Diagnosis not present

## 2016-09-30 DIAGNOSIS — N183 Chronic kidney disease, stage 3 (moderate): Secondary | ICD-10-CM | POA: Diagnosis not present

## 2016-09-30 DIAGNOSIS — I5023 Acute on chronic systolic (congestive) heart failure: Secondary | ICD-10-CM | POA: Diagnosis not present

## 2016-09-30 DIAGNOSIS — Z87440 Personal history of urinary (tract) infections: Secondary | ICD-10-CM | POA: Diagnosis not present

## 2016-09-30 DIAGNOSIS — I5022 Chronic systolic (congestive) heart failure: Secondary | ICD-10-CM | POA: Diagnosis not present

## 2016-09-30 DIAGNOSIS — D649 Anemia, unspecified: Secondary | ICD-10-CM | POA: Diagnosis not present

## 2016-09-30 DIAGNOSIS — R069 Unspecified abnormalities of breathing: Secondary | ICD-10-CM | POA: Diagnosis not present

## 2016-09-30 DIAGNOSIS — D638 Anemia in other chronic diseases classified elsewhere: Secondary | ICD-10-CM | POA: Diagnosis not present

## 2016-09-30 DIAGNOSIS — E785 Hyperlipidemia, unspecified: Secondary | ICD-10-CM | POA: Diagnosis not present

## 2016-09-30 DIAGNOSIS — Z955 Presence of coronary angioplasty implant and graft: Secondary | ICD-10-CM | POA: Diagnosis not present

## 2016-09-30 DIAGNOSIS — N184 Chronic kidney disease, stage 4 (severe): Secondary | ICD-10-CM | POA: Diagnosis not present

## 2016-09-30 DIAGNOSIS — R0602 Shortness of breath: Secondary | ICD-10-CM | POA: Diagnosis not present

## 2016-09-30 DIAGNOSIS — R600 Localized edema: Secondary | ICD-10-CM | POA: Diagnosis not present

## 2016-09-30 DIAGNOSIS — J9621 Acute and chronic respiratory failure with hypoxia: Secondary | ICD-10-CM | POA: Diagnosis not present

## 2016-09-30 DIAGNOSIS — I480 Paroxysmal atrial fibrillation: Secondary | ICD-10-CM | POA: Diagnosis not present

## 2016-09-30 DIAGNOSIS — I4891 Unspecified atrial fibrillation: Secondary | ICD-10-CM | POA: Diagnosis not present

## 2016-09-30 DIAGNOSIS — I130 Hypertensive heart and chronic kidney disease with heart failure and stage 1 through stage 4 chronic kidney disease, or unspecified chronic kidney disease: Secondary | ICD-10-CM | POA: Diagnosis not present

## 2016-10-01 DIAGNOSIS — N183 Chronic kidney disease, stage 3 (moderate): Secondary | ICD-10-CM | POA: Diagnosis not present

## 2016-10-01 DIAGNOSIS — I5023 Acute on chronic systolic (congestive) heart failure: Secondary | ICD-10-CM | POA: Diagnosis not present

## 2016-10-01 DIAGNOSIS — D649 Anemia, unspecified: Secondary | ICD-10-CM | POA: Diagnosis not present

## 2016-10-01 DIAGNOSIS — R0602 Shortness of breath: Secondary | ICD-10-CM | POA: Diagnosis not present

## 2016-10-01 DIAGNOSIS — I2510 Atherosclerotic heart disease of native coronary artery without angina pectoris: Secondary | ICD-10-CM | POA: Diagnosis not present

## 2016-10-02 DIAGNOSIS — I2510 Atherosclerotic heart disease of native coronary artery without angina pectoris: Secondary | ICD-10-CM | POA: Diagnosis not present

## 2016-10-02 DIAGNOSIS — N183 Chronic kidney disease, stage 3 (moderate): Secondary | ICD-10-CM | POA: Diagnosis not present

## 2016-10-02 DIAGNOSIS — D649 Anemia, unspecified: Secondary | ICD-10-CM | POA: Diagnosis not present

## 2016-10-02 DIAGNOSIS — I5023 Acute on chronic systolic (congestive) heart failure: Secondary | ICD-10-CM | POA: Diagnosis not present

## 2016-10-03 DIAGNOSIS — D649 Anemia, unspecified: Secondary | ICD-10-CM | POA: Diagnosis not present

## 2016-10-03 DIAGNOSIS — I2510 Atherosclerotic heart disease of native coronary artery without angina pectoris: Secondary | ICD-10-CM | POA: Diagnosis not present

## 2016-10-03 DIAGNOSIS — N183 Chronic kidney disease, stage 3 (moderate): Secondary | ICD-10-CM | POA: Diagnosis not present

## 2016-10-03 DIAGNOSIS — I5023 Acute on chronic systolic (congestive) heart failure: Secondary | ICD-10-CM | POA: Diagnosis not present

## 2016-10-06 DIAGNOSIS — I2510 Atherosclerotic heart disease of native coronary artery without angina pectoris: Secondary | ICD-10-CM | POA: Diagnosis not present

## 2016-10-06 DIAGNOSIS — I4891 Unspecified atrial fibrillation: Secondary | ICD-10-CM | POA: Diagnosis not present

## 2016-10-06 DIAGNOSIS — R402441 Other coma, without documented Glasgow coma scale score, or with partial score reported, in the field [EMT or ambulance]: Secondary | ICD-10-CM | POA: Diagnosis not present

## 2016-10-06 DIAGNOSIS — G9349 Other encephalopathy: Secondary | ICD-10-CM | POA: Diagnosis not present

## 2016-10-06 DIAGNOSIS — I5023 Acute on chronic systolic (congestive) heart failure: Secondary | ICD-10-CM | POA: Diagnosis not present

## 2016-10-06 DIAGNOSIS — E1165 Type 2 diabetes mellitus with hyperglycemia: Secondary | ICD-10-CM | POA: Diagnosis not present

## 2016-10-06 DIAGNOSIS — I63233 Cerebral infarction due to unspecified occlusion or stenosis of bilateral carotid arteries: Secondary | ICD-10-CM | POA: Diagnosis not present

## 2016-10-06 DIAGNOSIS — D649 Anemia, unspecified: Secondary | ICD-10-CM | POA: Diagnosis not present

## 2016-10-06 DIAGNOSIS — I639 Cerebral infarction, unspecified: Secondary | ICD-10-CM | POA: Diagnosis not present

## 2016-10-06 DIAGNOSIS — I509 Heart failure, unspecified: Secondary | ICD-10-CM | POA: Diagnosis not present

## 2016-10-06 DIAGNOSIS — G4733 Obstructive sleep apnea (adult) (pediatric): Secondary | ICD-10-CM | POA: Diagnosis not present

## 2016-10-06 DIAGNOSIS — Z955 Presence of coronary angioplasty implant and graft: Secondary | ICD-10-CM | POA: Diagnosis not present

## 2016-10-06 DIAGNOSIS — I130 Hypertensive heart and chronic kidney disease with heart failure and stage 1 through stage 4 chronic kidney disease, or unspecified chronic kidney disease: Secondary | ICD-10-CM | POA: Diagnosis not present

## 2016-10-06 DIAGNOSIS — N183 Chronic kidney disease, stage 3 (moderate): Secondary | ICD-10-CM | POA: Diagnosis not present

## 2016-10-06 DIAGNOSIS — E785 Hyperlipidemia, unspecified: Secondary | ICD-10-CM | POA: Diagnosis not present

## 2016-10-06 DIAGNOSIS — J810 Acute pulmonary edema: Secondary | ICD-10-CM | POA: Diagnosis not present

## 2016-10-06 DIAGNOSIS — I480 Paroxysmal atrial fibrillation: Secondary | ICD-10-CM | POA: Diagnosis not present

## 2016-10-06 DIAGNOSIS — R4182 Altered mental status, unspecified: Secondary | ICD-10-CM | POA: Diagnosis not present

## 2016-10-06 DIAGNOSIS — I5022 Chronic systolic (congestive) heart failure: Secondary | ICD-10-CM | POA: Diagnosis not present

## 2016-10-06 DIAGNOSIS — J9611 Chronic respiratory failure with hypoxia: Secondary | ICD-10-CM | POA: Diagnosis not present

## 2016-10-07 DIAGNOSIS — I2510 Atherosclerotic heart disease of native coronary artery without angina pectoris: Secondary | ICD-10-CM | POA: Diagnosis not present

## 2016-10-07 DIAGNOSIS — R4182 Altered mental status, unspecified: Secondary | ICD-10-CM | POA: Diagnosis not present

## 2016-10-07 DIAGNOSIS — N183 Chronic kidney disease, stage 3 (moderate): Secondary | ICD-10-CM | POA: Diagnosis not present

## 2016-10-07 DIAGNOSIS — I4891 Unspecified atrial fibrillation: Secondary | ICD-10-CM | POA: Diagnosis not present

## 2016-10-07 DIAGNOSIS — D649 Anemia, unspecified: Secondary | ICD-10-CM | POA: Diagnosis not present

## 2016-10-08 DIAGNOSIS — D649 Anemia, unspecified: Secondary | ICD-10-CM | POA: Diagnosis not present

## 2016-10-08 DIAGNOSIS — I2510 Atherosclerotic heart disease of native coronary artery without angina pectoris: Secondary | ICD-10-CM | POA: Diagnosis not present

## 2016-10-08 DIAGNOSIS — I4891 Unspecified atrial fibrillation: Secondary | ICD-10-CM | POA: Diagnosis not present

## 2016-10-08 DIAGNOSIS — R4182 Altered mental status, unspecified: Secondary | ICD-10-CM | POA: Diagnosis not present

## 2016-10-08 DIAGNOSIS — N183 Chronic kidney disease, stage 3 (moderate): Secondary | ICD-10-CM | POA: Diagnosis not present

## 2016-10-12 ENCOUNTER — Ambulatory Visit (INDEPENDENT_AMBULATORY_CARE_PROVIDER_SITE_OTHER): Payer: Medicare Other | Admitting: Cardiology

## 2016-10-12 ENCOUNTER — Encounter: Payer: Self-pay | Admitting: Cardiology

## 2016-10-12 VITALS — BP 110/64 | HR 76 | Resp 12 | Ht 63.0 in | Wt 179.0 lb

## 2016-10-12 DIAGNOSIS — N184 Chronic kidney disease, stage 4 (severe): Secondary | ICD-10-CM | POA: Diagnosis not present

## 2016-10-12 DIAGNOSIS — I480 Paroxysmal atrial fibrillation: Secondary | ICD-10-CM

## 2016-10-12 DIAGNOSIS — I129 Hypertensive chronic kidney disease with stage 1 through stage 4 chronic kidney disease, or unspecified chronic kidney disease: Secondary | ICD-10-CM | POA: Diagnosis not present

## 2016-10-12 DIAGNOSIS — G4733 Obstructive sleep apnea (adult) (pediatric): Secondary | ICD-10-CM

## 2016-10-12 DIAGNOSIS — D631 Anemia in chronic kidney disease: Secondary | ICD-10-CM

## 2016-10-12 DIAGNOSIS — R001 Bradycardia, unspecified: Secondary | ICD-10-CM | POA: Diagnosis not present

## 2016-10-12 DIAGNOSIS — I2510 Atherosclerotic heart disease of native coronary artery without angina pectoris: Secondary | ICD-10-CM

## 2016-10-12 HISTORY — DX: Atherosclerotic heart disease of native coronary artery without angina pectoris: I25.10

## 2016-10-12 NOTE — Patient Instructions (Signed)
Medication Instructions:  Your physician recommends that you continue on your current medications as directed. Please refer to the Current Medication list given to you today.  Labwork: Your physician recommends that you have lab work today, BMP and CBC   Testing/Procedures: None ordered  Follow-Up: Your physician recommends that you schedule a follow-up appointment in: 3 weeks with Dr. Agustin Cree   Any Other Special Instructions Will Be Listed Below (If Applicable).     If you need a refill on your cardiac medications before your next appointment, please call your pharmacy.

## 2016-10-12 NOTE — Progress Notes (Signed)
Cardiology Office Note:    Date:  10/12/2016   ID:  Amanda [name], DOB [birth year], MRN [id number]  PCP:  Cyndy Freeze, MD  Cardiologist:  Jenne Campus, MD    Referring MD: Cyndy Freeze, MD   No chief complaint on file. Weak and tired  History of Present Illness:    Amanda [name] is a 76 y.o. female  with very complex past medical history. Apparently she went to [facility] because of significant anemia up her lower this was to be was done. She did have elevation of troponin I and eventually cardiac catheterization with non-drug-eluting stents to meet the right coronary artery was done. Thereafter she end up coming to [facility] because of acute stroke. She was also find to be bradycardiac her amiodarone as well as beta blocker has being withdrawn. While in [facility] conversation regarding watchman device as well as pacemaker was initiated. She requested to be seen again to talk about those issues. Denies having the chest pain tightness squeezing pressure burning chest, she is weak and tired but otherwise seems to be doing well. No dizziness or passing out, or palpitations.  Past Medical History:  Diagnosis Date  . Acute ischemic stroke ([place])   . Acute metabolic encephalopathy   . Acute on chronic systolic (congestive) heart failure ([place])   . Atrial fibrillation ([place])   . Chronic anemia   . Chronic kidney disease (CKD), stage III (moderate)   . Diabetes mellitus without complication ([place])   . Diverticulosis   . Gastroesophageal reflux   . Hyperlipidemia   . Hypertensive heart disease with heart failure ([place])   . Myocardial infarction ([place])   . OSA on CPAP   . Renal failure, chronic, stage 3 (moderate)   . Systolic heart failure ([place])   . Thyroid disease     Past Surgical History:  Procedure Laterality Date  . CARDIAC CATHETERIZATION    . EXCISIONAL HEMORRHOIDECTOMY    . NOSE SURGERY      Current Medications: Current Meds    Medication Sig  . albuterol (PROVENTIL HFA;VENTOLIN HFA) 108 (90 Base) MCG/ACT inhaler Inhale 2 puffs into the lungs every 6 (six) hours as needed for wheezing or shortness of breath.  Marland Kitchen apixaban (ELIQUIS) 5 MG TABS tablet Take 5 mg by mouth 2 (two) times daily.  . Baclofen 5 MG TABS Take 1 tablet by mouth 3 (three) times daily.  . clopidogrel (PLAVIX) 75 MG tablet Take 75 mg by mouth daily.  . Coenzyme Q10 100 MG TABS Take 100 mg by mouth daily at 12 noon.   . colchicine 0.6 MG tablet Take 0.6-1 mg by mouth See admin instructions. 1.2mg  at onset of gout attack, then 0.6mg  an hour later. Next day 0.6mg  twice a day, then 0.6mg  daily until gout pain subsides.  . FeAsp-FeFum -Suc-C-Thre-B12-FA (MULTIGEN PLUS PO) Take 1 tablet by mouth daily.  . febuxostat (ULORIC) 40 MG tablet Take 40 mg by mouth daily.  . folic acid (FOLVITE) 329 MCG tablet Take 400 mcg by mouth daily at 12 noon.  . furosemide (LASIX) 40 MG tablet Take 60 mg by mouth 2 (two) times daily.   . Glucosamine-Chondroitin (OSTEO BI-FLEX REGULAR STRENGTH PO) Take 1 capsule by mouth daily at 12 noon.  . isosorbide mononitrate (IMDUR) 30 MG 24 hr tablet Take 30 mg by mouth daily.  . Levothyroxine Sodium 50 MCG CAPS Take 50 mcg by mouth daily before breakfast.   . metolazone (ZAROXOLYN) 5 MG tablet Take 5  mg by mouth daily.  . multivitamin-lutein (OCUVITE-LUTEIN) CAPS capsule Take 1 capsule by mouth daily at 12 noon.  . nitroGLYCERIN (NITROSTAT) 0.4 MG SL tablet Place 0.4 mg under the tongue every 5 (five) minutes as needed for chest pain.  Marland Kitchen omega-3 acid ethyl esters (LOVAZA) 1 g capsule Take 1 g by mouth daily at 12 noon.  Marland Kitchen oxycodone (OXY-IR) 5 MG capsule Take 5 mg by mouth every 6 (six) hours as needed for pain.  . pantoprazole (PROTONIX) 40 MG tablet Take 40 mg by mouth daily.  . potassium chloride SA (K-DUR,KLOR-CON) 20 MEQ tablet Take 20 mEq by mouth daily.  . pravastatin (PRAVACHOL) 40 MG tablet Take 40 mg by mouth daily.  .  ranolazine (RANEXA) 1000 MG SR tablet Take 1,000 mg by mouth 2 (two) times daily.   . vitamin B-12 (CYANOCOBALAMIN) 1000 MCG tablet Take 1,000 mcg by mouth daily at 12 noon.  Marland Kitchen zolpidem (AMBIEN) 10 MG tablet Take 10 mg by mouth at bedtime as needed for sleep.     Allergies:   Ciprofloxacin and Statins   Social History   Social History  . Marital status: Unknown    Spouse name: N/A  . Number of children: N/A  . Years of education: N/A   Social History Main Topics  . Smoking status: Former Research scientist (life sciences)  . Smokeless tobacco: Never Used  . Alcohol use No  . Drug use: No  . Sexual activity: Not Asked   Other Topics Concern  . None   Social History Narrative  . None     Family History: The patient's family history includes Breast cancer in her mother; Diabetes in her sister; Heart attack in her brother, maternal uncle, and sister; Hypertension in her father; Prostate cancer in her father; Stroke in her father. ROS:   Please see the history of present illness.    All 14 point review of systems negative except as described per history of present illness  EKGs/Labs/Other Studies Reviewed:      Recent Labs: No results found for requested labs within last 8760 hours.  Recent Lipid Panel No results found for: CHOL, TRIG, HDL, CHOLHDL, VLDL, LDLCALC, LDLDIRECT  Physical Exam:    VS:  BP 110/64   Pulse 76   Resp 12   Ht 5\' 3"  (1.6 m)   Wt 179 lb (81.2 kg)   BMI 31.71 kg/m     Wt Readings from Last 3 Encounters:  10/12/16 179 lb (81.2 kg)     GEN:  Well nourished, well developed in no acute distress HEENT: Normal NECK: No JVD; No carotid bruits LYMPHATICS: No lymphadenopathy CARDIAC: RRR, no murmurs, no rubs, no gallops RESPIRATORY:  Clear to auscultation without rales, wheezing or rhonchi  ABDOMEN: Soft, non-tender, non-distended MUSCULOSKELETAL:  No edema; No deformity  SKIN: Warm and dry LOWER EXTREMITIES: no swelling NEUROLOGIC:  Alert and oriented x 3 PSYCHIATRIC:   Normal affect   ASSESSMENT:    1. Paroxysmal atrial fibrillation (HCC)   2. Benign hypertension with CKD (chronic kidney disease) stage IV ([place])   3. Obstructive sleep apnea   4. Bradycardia   5. Anemia due to stage 4 chronic kidney disease ([place])   6. Coronary artery disease involving native coronary artery of native heart without angina pectoris    PLAN:    In order of problems listed above:  1. Paroxysmal mitral fibrillation: Maintaining sinus rhythm EKG today, from that. She's been on Eliquis 5 mg twice a day. I will ask  her to have Chem-7 done as well as CBC. She is also taking Plavix 75 mg daily. If her CBC is stable we'll continue that management if not will probably be forced to discontinue anticoagulation. So far she had quite extensive GI evaluation without revealing the source of bleeding. In the future also would like to put her back on small dose of amiodarone. If she is anemic and anticoagulation to be withdrawn more essential will be to maintain her rhythm. Intensive watchman device she will be scheduled to see Dr. Kerry Dory at [facility] with consideration of watchman device implantation.  2. Essential hypertension: Well maintained will continue present management. 3. Bradycardia: Not critical. We'll continue monitoring I suspect in the future I be able to add small dose of amiodarone. She may require eventual pacemaker for protection from bradycardia. 4. Anemia: We will check her CBC.  Very complicated case required reviewing oh multiple medical records and long conversation with her as well as with her husband.    Medication Adjustments/Labs and Tests Ordered: Current medicines are reviewed at length with the patient today.  Concerns regarding medicines are outlined above.  Orders Placed This Encounter  Procedures  . Basic metabolic panel  . CBC  . EKG 12-Lead   Medication changes: No orders of the defined types were placed in this encounter.   Signed, Park Liter, MD, [facility] 10/12/2016 2:33 PM    [place]

## 2016-10-13 LAB — CBC
Hematocrit: 34.4 % (ref 34.0–46.6)
Hemoglobin: 10.7 g/dL — ABNORMAL LOW (ref 11.1–15.9)
MCH: 27.9 pg (ref 26.6–33.0)
MCHC: 31.1 g/dL — AB (ref 31.5–35.7)
MCV: 90 fL (ref 79–97)
PLATELETS: 278 10*3/uL (ref 150–379)
RBC: 3.83 x10E6/uL (ref 3.77–5.28)
RDW: 16.5 % — ABNORMAL HIGH (ref 12.3–15.4)
WBC: 9.5 10*3/uL (ref 3.4–10.8)

## 2016-10-13 LAB — BASIC METABOLIC PANEL
BUN/Creatinine Ratio: 20 (ref 12–28)
BUN: 53 mg/dL — AB (ref 8–27)
CALCIUM: 11 mg/dL — AB (ref 8.7–10.3)
CO2: 30 mmol/L — AB (ref 20–29)
CREATININE: 2.69 mg/dL — AB (ref 0.57–1.00)
Chloride: 88 mmol/L — ABNORMAL LOW (ref 96–106)
GFR calc Af Amer: 19 mL/min/{1.73_m2} — ABNORMAL LOW (ref >59)
GFR calc non Af Amer: 17 mL/min/{1.73_m2} — ABNORMAL LOW (ref >59)
GLUCOSE: 199 mg/dL — AB (ref 65–99)
POTASSIUM: 4.8 mmol/L (ref 3.5–5.2)
Sodium: 136 mmol/L (ref 134–144)

## 2016-10-14 NOTE — Addendum Note (Signed)
Addended by: Kathyrn Sheriff on: 10/14/2016 09:57 AM   Modules accepted: Orders

## 2016-10-15 DIAGNOSIS — N184 Chronic kidney disease, stage 4 (severe): Secondary | ICD-10-CM | POA: Diagnosis not present

## 2016-10-16 ENCOUNTER — Telehealth: Payer: Self-pay

## 2016-10-16 NOTE — Telephone Encounter (Signed)
-----   Message from Park Liter, MD sent at 10/16/2016 10:04 AM EDT ----- Creat elevated , will repeat in 1 week

## 2016-10-16 NOTE — Telephone Encounter (Signed)
Pt states that she had blood drawn at her PCP yesterday at which she states the obtained a metabolic panel. I have asked her that once she gets these results to have them fax this to [place]. She states she will let me know as soon as she has there results.

## 2016-10-20 DIAGNOSIS — N179 Acute kidney failure, unspecified: Secondary | ICD-10-CM | POA: Diagnosis not present

## 2016-10-20 DIAGNOSIS — N184 Chronic kidney disease, stage 4 (severe): Secondary | ICD-10-CM | POA: Diagnosis not present

## 2016-10-20 DIAGNOSIS — D631 Anemia in chronic kidney disease: Secondary | ICD-10-CM | POA: Diagnosis not present

## 2016-10-20 DIAGNOSIS — I129 Hypertensive chronic kidney disease with stage 1 through stage 4 chronic kidney disease, or unspecified chronic kidney disease: Secondary | ICD-10-CM | POA: Diagnosis not present

## 2016-10-22 DIAGNOSIS — I482 Chronic atrial fibrillation: Secondary | ICD-10-CM | POA: Diagnosis not present

## 2016-10-22 DIAGNOSIS — D631 Anemia in chronic kidney disease: Secondary | ICD-10-CM | POA: Diagnosis not present

## 2016-10-22 DIAGNOSIS — N184 Chronic kidney disease, stage 4 (severe): Secondary | ICD-10-CM | POA: Diagnosis not present

## 2016-10-22 DIAGNOSIS — I5023 Acute on chronic systolic (congestive) heart failure: Secondary | ICD-10-CM | POA: Diagnosis not present

## 2016-10-26 DIAGNOSIS — N184 Chronic kidney disease, stage 4 (severe): Secondary | ICD-10-CM | POA: Diagnosis not present

## 2016-10-27 ENCOUNTER — Telehealth: Payer: Self-pay | Admitting: Cardiology

## 2016-10-27 NOTE — Telephone Encounter (Signed)
S/w pt advised [facility] has not sent her labs yet. I will call and get those today. Pt advised to keep her appointment for [place].

## 2016-10-27 NOTE — Telephone Encounter (Signed)
Waiting to hear latest blood work results and if she needs to keep her appt in [facility]

## 2016-10-29 DIAGNOSIS — G4733 Obstructive sleep apnea (adult) (pediatric): Secondary | ICD-10-CM | POA: Diagnosis not present

## 2016-10-29 DIAGNOSIS — I480 Paroxysmal atrial fibrillation: Secondary | ICD-10-CM | POA: Diagnosis not present

## 2016-10-29 DIAGNOSIS — J9610 Chronic respiratory failure, unspecified whether with hypoxia or hypercapnia: Secondary | ICD-10-CM | POA: Diagnosis not present

## 2016-11-02 HISTORY — PX: LEFT ATRIAL APPENDAGE OCCLUSION: SHX173A

## 2016-11-03 ENCOUNTER — Ambulatory Visit (INDEPENDENT_AMBULATORY_CARE_PROVIDER_SITE_OTHER): Payer: Medicare Other | Admitting: Cardiology

## 2016-11-03 ENCOUNTER — Encounter: Payer: Self-pay | Admitting: Cardiology

## 2016-11-03 VITALS — BP 118/60 | HR 80 | Resp 12 | Ht 63.0 in | Wt 168.0 lb

## 2016-11-03 DIAGNOSIS — R001 Bradycardia, unspecified: Secondary | ICD-10-CM | POA: Diagnosis not present

## 2016-11-03 DIAGNOSIS — I480 Paroxysmal atrial fibrillation: Secondary | ICD-10-CM | POA: Diagnosis not present

## 2016-11-03 DIAGNOSIS — I2510 Atherosclerotic heart disease of native coronary artery without angina pectoris: Secondary | ICD-10-CM

## 2016-11-03 DIAGNOSIS — I129 Hypertensive chronic kidney disease with stage 1 through stage 4 chronic kidney disease, or unspecified chronic kidney disease: Secondary | ICD-10-CM | POA: Diagnosis not present

## 2016-11-03 DIAGNOSIS — N184 Chronic kidney disease, stage 4 (severe): Secondary | ICD-10-CM

## 2016-11-03 NOTE — Progress Notes (Signed)
Cardiology Office Note:    Date:  11/03/2016   ID:  Amanda [name], DOB [birth year], MRN [id number]  PCP:  Cyndy Freeze, MD  Cardiologist:  Jenne Campus, MD    Referring MD: Cyndy Freeze, MD   Chief Complaint  Patient presents with  . 3 week follow up  I'm doing better  History of Present Illness:    Amanda [name] is a 76 y.o. female  with quite complex past medical history. A few months ago she had stent placed to right coronary artery, she was on triple therapy with Eliquis, aspirin, another antiplatelet agent, and up having significant GI bleed, aspirin has been withdrawn and now she is only on Eliquis and clopidogrel. I refer her to [facility] with consideration of watchman device. Procedure is scheduled for 17th of this month. We talked about this again and explained to her was the rationale for doing it she is understanding and she is willing to proceed. Cardiac-wise seems to be doing better. She said she has more energy. She is able to do more. She was able to get the car by herself today which was the achievement for her. Denies having any palpitations tightness squeezing pressure burning in her chest  Past Medical History:  Diagnosis Date  . Acute ischemic stroke ([place])   . Acute metabolic encephalopathy   . Acute on chronic systolic (congestive) heart failure ([place])   . Atrial fibrillation ([place])   . Chronic anemia   . Chronic kidney disease (CKD), stage III (moderate) (HCC)   . Diabetes mellitus without complication ([place])   . Diverticulosis   . Gastroesophageal reflux   . Hyperlipidemia   . Hypertensive heart disease with heart failure ([place])   . Myocardial infarction ([place])   . OSA on CPAP   . Renal failure, chronic, stage 3 (moderate) (HCC)   . Systolic heart failure ([place])   . Thyroid disease     Past Surgical History:  Procedure Laterality Date  . CARDIAC CATHETERIZATION    . EXCISIONAL HEMORRHOIDECTOMY    . NOSE SURGERY      Current  Medications: Current Meds  Medication Sig  . albuterol (PROVENTIL HFA;VENTOLIN HFA) 108 (90 Base) MCG/ACT inhaler Inhale 2 puffs into the lungs every 6 (six) hours as needed for wheezing or shortness of breath.  Marland Kitchen apixaban (ELIQUIS) 5 MG TABS tablet Take 5 mg by mouth 2 (two) times daily.  . Baclofen 5 MG TABS Take 1 tablet by mouth 3 (three) times daily.  . clopidogrel (PLAVIX) 75 MG tablet Take 75 mg by mouth daily.  . Coenzyme Q10 100 MG TABS Take 100 mg by mouth daily at 12 noon.   . colchicine 0.6 MG tablet Take 0.6-1 mg by mouth See admin instructions. 1.2mg  at onset of gout attack, then 0.6mg  an hour later. Next day 0.6mg  twice a day, then 0.6mg  daily until gout pain subsides.  . FeAsp-FeFum -Suc-C-Thre-B12-FA (MULTIGEN PLUS PO) Take 1 tablet by mouth daily.  . febuxostat (ULORIC) 40 MG tablet Take 40 mg by mouth daily.  . folic acid (FOLVITE) 916 MCG tablet Take 400 mcg by mouth daily at 12 noon.  . furosemide (LASIX) 40 MG tablet Take 60 mg by mouth 2 (two) times daily. 60 mg in the morning and 40 mg at noon  . Glucosamine-Chondroitin (OSTEO BI-FLEX REGULAR STRENGTH PO) Take 1 capsule by mouth daily at 12 noon.  . isosorbide mononitrate (IMDUR) 30 MG 24 hr tablet Take 30 mg by mouth daily.  . Levothyroxine  Sodium 50 MCG CAPS Take 50 mcg by mouth daily before breakfast.   . multivitamin-lutein (OCUVITE-LUTEIN) CAPS capsule Take 1 capsule by mouth daily at 12 noon.  . nitroGLYCERIN (NITROSTAT) 0.4 MG SL tablet Place 0.4 mg under the tongue every 5 (five) minutes as needed for chest pain.  Marland Kitchen omega-3 acid ethyl esters (LOVAZA) 1 g capsule Take 1 g by mouth daily at 12 noon.  Marland Kitchen oxycodone (OXY-IR) 5 MG capsule Take 5 mg by mouth every 6 (six) hours as needed for pain.  . pantoprazole (PROTONIX) 40 MG tablet Take 40 mg by mouth daily.  . potassium chloride SA (K-DUR,KLOR-CON) 20 MEQ tablet Take 20 mEq by mouth daily.  . pravastatin (PRAVACHOL) 40 MG tablet Take 40 mg by mouth daily.  .  ranolazine (RANEXA) 1000 MG SR tablet Take 1,000 mg by mouth 2 (two) times daily.   . vitamin B-12 (CYANOCOBALAMIN) 1000 MCG tablet Take 1,000 mcg by mouth daily at 12 noon.  Marland Kitchen zolpidem (AMBIEN) 10 MG tablet Take 10 mg by mouth at bedtime as needed for sleep.     Allergies:   Ciprofloxacin and Statins   Social History   Social History  . Marital status: Unknown    Spouse name: N/A  . Number of children: N/A  . Years of education: N/A   Social History Main Topics  . Smoking status: Former Research scientist (life sciences)  . Smokeless tobacco: Never Used  . Alcohol use No  . Drug use: No  . Sexual activity: Not Asked   Other Topics Concern  . None   Social History Narrative  . None     Family History: The patient's family history includes Breast cancer in her mother; Diabetes in her sister; Heart attack in her brother, maternal uncle, and sister; Hypertension in her father; Prostate cancer in her father; Stroke in her father. ROS:   Please see the history of present illness.    All 14 point review of systems negative except as described per history of present illness  EKGs/Labs/Other Studies Reviewed:      Recent Labs: 10/12/2016: BUN 53; Creatinine, Ser 2.69; Hemoglobin 10.7; Platelets 278; Potassium 4.8; Sodium 136  Recent Lipid Panel No results found for: CHOL, TRIG, HDL, CHOLHDL, VLDL, LDLCALC, LDLDIRECT  Physical Exam:    VS:  BP 118/60   Pulse 80   Resp 12   Ht 5\' 3"  (1.6 m)   Wt 168 lb (76.2 kg)   BMI 29.76 kg/m     Wt Readings from Last 3 Encounters:  11/03/16 168 lb (76.2 kg)  10/12/16 179 lb (81.2 kg)     GEN:  Well nourished, well developed in no acute distress HEENT: Normal NECK: No JVD; No carotid bruits LYMPHATICS: No lymphadenopathy CARDIAC: RRR, no murmurs, no rubs, no gallops RESPIRATORY:  Clear to auscultation without rales, wheezing or rhonchi  ABDOMEN: Soft, non-tender, non-distended MUSCULOSKELETAL:  No edema; No deformity  SKIN: Warm and dry LOWER  EXTREMITIES: no swelling NEUROLOGIC:  Alert and oriented x 3 PSYCHIATRIC:  Normal affect   ASSESSMENT:    1. Paroxysmal atrial fibrillation (HCC)   2. Benign hypertension with CKD (chronic kidney disease) stage IV ([place])   3. Coronary artery disease involving native coronary artery of native heart without angina pectoris   4. Bradycardia    PLAN:    In order of problems listed above:  1. Paroxysmal fibrillation: Anticoagulated watch monitor device implantation is being scheduled.  2. Benign essential hypertension: Blood pressure is well-controlled today we'll  continue present management. 3. Coronary artery disease: Stable denies having any symptoms. Continue clopidogrel. 4. Bradycardic: Not critical. We'll continue present management   Medication Adjustments/Labs and Tests Ordered: Current medicines are reviewed at length with the patient today.  Concerns regarding medicines are outlined above.  No orders of the defined types were placed in this encounter.  Medication changes: No orders of the defined types were placed in this encounter.   Signed, Park Liter, MD, [facility] 11/03/2016 10:54 AM    [place]

## 2016-11-03 NOTE — Patient Instructions (Signed)
Medication Instructions:  Your physician recommends that you continue on your current medications as directed. Please refer to the Current Medication list given to you today.  Labwork: None   Testing/Procedures: None   Follow-Up: Your physician recommends that you schedule a follow-up appointment in: 3 months   Any Other Special Instructions Will Be Listed Below (If Applicable).  Please note that any paperwork needing to be filled out by the provider will need to be addressed at the front desk prior to seeing the provider. Please note that any paperwork FMLA, Disability or other documents regarding health condition is subject to a $25.00 charge that must be received prior to completion of paperwork in the form of a money order or check.   1. Avoid all over-the-counter antihistamines except Claritin/Loratadine and Zyrtec/Cetrizine. 2. Avoid all combination including cold sinus allergies flu decongestant and sleep medications 3. You can use Robitussin DM Mucinex and Mucinex DM for cough. 4. can use Tylenol aspirin ibuprofen and naproxen but no combinations such as sleep or sinus.    If you need a refill on your cardiac medications before your next appointment, please call your pharmacy.

## 2016-11-05 DIAGNOSIS — Z23 Encounter for immunization: Secondary | ICD-10-CM | POA: Diagnosis not present

## 2016-11-06 DIAGNOSIS — N184 Chronic kidney disease, stage 4 (severe): Secondary | ICD-10-CM | POA: Diagnosis not present

## 2016-11-10 DIAGNOSIS — N184 Chronic kidney disease, stage 4 (severe): Secondary | ICD-10-CM | POA: Diagnosis not present

## 2016-11-10 DIAGNOSIS — I129 Hypertensive chronic kidney disease with stage 1 through stage 4 chronic kidney disease, or unspecified chronic kidney disease: Secondary | ICD-10-CM | POA: Diagnosis not present

## 2016-11-10 DIAGNOSIS — E1122 Type 2 diabetes mellitus with diabetic chronic kidney disease: Secondary | ICD-10-CM | POA: Diagnosis not present

## 2016-11-10 DIAGNOSIS — E889 Metabolic disorder, unspecified: Secondary | ICD-10-CM | POA: Diagnosis not present

## 2016-11-18 DIAGNOSIS — E785 Hyperlipidemia, unspecified: Secondary | ICD-10-CM | POA: Diagnosis not present

## 2016-11-18 DIAGNOSIS — I5022 Chronic systolic (congestive) heart failure: Secondary | ICD-10-CM | POA: Diagnosis not present

## 2016-11-18 DIAGNOSIS — I130 Hypertensive heart and chronic kidney disease with heart failure and stage 1 through stage 4 chronic kidney disease, or unspecified chronic kidney disease: Secondary | ICD-10-CM | POA: Diagnosis not present

## 2016-11-18 DIAGNOSIS — Z8673 Personal history of transient ischemic attack (TIA), and cerebral infarction without residual deficits: Secondary | ICD-10-CM | POA: Diagnosis not present

## 2016-11-18 DIAGNOSIS — N184 Chronic kidney disease, stage 4 (severe): Secondary | ICD-10-CM | POA: Diagnosis not present

## 2016-11-18 DIAGNOSIS — I480 Paroxysmal atrial fibrillation: Secondary | ICD-10-CM | POA: Diagnosis not present

## 2016-11-18 DIAGNOSIS — R001 Bradycardia, unspecified: Secondary | ICD-10-CM | POA: Diagnosis not present

## 2016-11-18 DIAGNOSIS — I2510 Atherosclerotic heart disease of native coronary artery without angina pectoris: Secondary | ICD-10-CM | POA: Diagnosis not present

## 2016-11-18 DIAGNOSIS — G4733 Obstructive sleep apnea (adult) (pediatric): Secondary | ICD-10-CM | POA: Diagnosis not present

## 2016-11-18 DIAGNOSIS — Z006 Encounter for examination for normal comparison and control in clinical research program: Secondary | ICD-10-CM | POA: Diagnosis not present

## 2016-11-18 DIAGNOSIS — I4891 Unspecified atrial fibrillation: Secondary | ICD-10-CM | POA: Diagnosis not present

## 2016-11-18 DIAGNOSIS — E1122 Type 2 diabetes mellitus with diabetic chronic kidney disease: Secondary | ICD-10-CM | POA: Diagnosis not present

## 2016-11-19 DIAGNOSIS — I4891 Unspecified atrial fibrillation: Secondary | ICD-10-CM | POA: Diagnosis not present

## 2016-11-19 DIAGNOSIS — R001 Bradycardia, unspecified: Secondary | ICD-10-CM | POA: Diagnosis not present

## 2016-11-27 DIAGNOSIS — I5022 Chronic systolic (congestive) heart failure: Secondary | ICD-10-CM | POA: Diagnosis not present

## 2016-11-27 DIAGNOSIS — I482 Chronic atrial fibrillation: Secondary | ICD-10-CM | POA: Diagnosis not present

## 2016-11-27 DIAGNOSIS — E782 Mixed hyperlipidemia: Secondary | ICD-10-CM | POA: Diagnosis not present

## 2016-11-27 DIAGNOSIS — E039 Hypothyroidism, unspecified: Secondary | ICD-10-CM | POA: Diagnosis not present

## 2016-12-17 DIAGNOSIS — N184 Chronic kidney disease, stage 4 (severe): Secondary | ICD-10-CM | POA: Diagnosis not present

## 2016-12-17 DIAGNOSIS — R5381 Other malaise: Secondary | ICD-10-CM | POA: Diagnosis not present

## 2016-12-17 DIAGNOSIS — I482 Chronic atrial fibrillation: Secondary | ICD-10-CM | POA: Diagnosis not present

## 2016-12-17 DIAGNOSIS — I110 Hypertensive heart disease with heart failure: Secondary | ICD-10-CM | POA: Diagnosis not present

## 2016-12-21 DIAGNOSIS — D649 Anemia, unspecified: Secondary | ICD-10-CM | POA: Diagnosis not present

## 2016-12-23 DIAGNOSIS — N189 Chronic kidney disease, unspecified: Secondary | ICD-10-CM | POA: Diagnosis not present

## 2016-12-23 DIAGNOSIS — D631 Anemia in chronic kidney disease: Secondary | ICD-10-CM | POA: Diagnosis not present

## 2016-12-25 DIAGNOSIS — D631 Anemia in chronic kidney disease: Secondary | ICD-10-CM | POA: Diagnosis not present

## 2016-12-25 DIAGNOSIS — N189 Chronic kidney disease, unspecified: Secondary | ICD-10-CM | POA: Diagnosis not present

## 2016-12-27 DIAGNOSIS — N189 Chronic kidney disease, unspecified: Secondary | ICD-10-CM | POA: Diagnosis not present

## 2016-12-27 DIAGNOSIS — D631 Anemia in chronic kidney disease: Secondary | ICD-10-CM | POA: Diagnosis not present

## 2016-12-29 DIAGNOSIS — D631 Anemia in chronic kidney disease: Secondary | ICD-10-CM | POA: Diagnosis not present

## 2016-12-29 DIAGNOSIS — E889 Metabolic disorder, unspecified: Secondary | ICD-10-CM | POA: Diagnosis not present

## 2016-12-29 DIAGNOSIS — E1122 Type 2 diabetes mellitus with diabetic chronic kidney disease: Secondary | ICD-10-CM | POA: Diagnosis not present

## 2016-12-29 DIAGNOSIS — I129 Hypertensive chronic kidney disease with stage 1 through stage 4 chronic kidney disease, or unspecified chronic kidney disease: Secondary | ICD-10-CM | POA: Diagnosis not present

## 2016-12-29 DIAGNOSIS — E559 Vitamin D deficiency, unspecified: Secondary | ICD-10-CM | POA: Diagnosis not present

## 2016-12-29 DIAGNOSIS — N184 Chronic kidney disease, stage 4 (severe): Secondary | ICD-10-CM | POA: Diagnosis not present

## 2017-01-07 DIAGNOSIS — E1122 Type 2 diabetes mellitus with diabetic chronic kidney disease: Secondary | ICD-10-CM | POA: Diagnosis not present

## 2017-01-07 DIAGNOSIS — I10 Essential (primary) hypertension: Secondary | ICD-10-CM | POA: Diagnosis not present

## 2017-01-07 DIAGNOSIS — I2510 Atherosclerotic heart disease of native coronary artery without angina pectoris: Secondary | ICD-10-CM | POA: Diagnosis not present

## 2017-01-07 DIAGNOSIS — I129 Hypertensive chronic kidney disease with stage 1 through stage 4 chronic kidney disease, or unspecified chronic kidney disease: Secondary | ICD-10-CM | POA: Diagnosis not present

## 2017-01-07 DIAGNOSIS — Z888 Allergy status to other drugs, medicaments and biological substances status: Secondary | ICD-10-CM | POA: Diagnosis not present

## 2017-01-07 DIAGNOSIS — Z9689 Presence of other specified functional implants: Secondary | ICD-10-CM | POA: Diagnosis not present

## 2017-01-07 DIAGNOSIS — D500 Iron deficiency anemia secondary to blood loss (chronic): Secondary | ICD-10-CM | POA: Diagnosis not present

## 2017-01-07 DIAGNOSIS — N189 Chronic kidney disease, unspecified: Secondary | ICD-10-CM | POA: Diagnosis not present

## 2017-01-07 DIAGNOSIS — I480 Paroxysmal atrial fibrillation: Secondary | ICD-10-CM | POA: Diagnosis not present

## 2017-01-07 DIAGNOSIS — I482 Chronic atrial fibrillation: Secondary | ICD-10-CM | POA: Diagnosis not present

## 2017-01-07 DIAGNOSIS — E785 Hyperlipidemia, unspecified: Secondary | ICD-10-CM | POA: Diagnosis not present

## 2017-01-07 DIAGNOSIS — I4891 Unspecified atrial fibrillation: Secondary | ICD-10-CM | POA: Diagnosis not present

## 2017-01-07 DIAGNOSIS — Z955 Presence of coronary angioplasty implant and graft: Secondary | ICD-10-CM | POA: Diagnosis not present

## 2017-01-13 ENCOUNTER — Ambulatory Visit: Payer: Medicare Other | Admitting: Cardiology

## 2017-01-13 ENCOUNTER — Encounter: Payer: Self-pay | Admitting: Cardiology

## 2017-01-13 VITALS — BP 130/66 | HR 58 | Ht 63.0 in | Wt 171.8 lb

## 2017-01-13 DIAGNOSIS — I2510 Atherosclerotic heart disease of native coronary artery without angina pectoris: Secondary | ICD-10-CM | POA: Diagnosis not present

## 2017-01-13 DIAGNOSIS — I480 Paroxysmal atrial fibrillation: Secondary | ICD-10-CM | POA: Diagnosis not present

## 2017-01-13 NOTE — Progress Notes (Signed)
Cardiology Office Note:    Date:  01/13/2017   ID:  Amanda [name], DOB 1940-02-23, MRN [id number]  PCP:  Cyndy Freeze, MD  Cardiologist:  Jenne Campus, MD    Referring MD: Cyndy Freeze, MD   Chief Complaint  Patient presents with  . Follow-up  Doing well follow-up visits after watchman device  History of Present Illness:    Amanda [name] is a 76 y.o. female with proximal atrial fibrillation she described a few days ago had long episode of atrial fibrillation but doing well now.  No chest pain tightness squeezing pressure burning chest weak and exhausted.  She looks somewhat pale I will check her CBC today  Past Medical History:  Diagnosis Date  . Acute ischemic stroke ([place])   . Acute metabolic encephalopathy   . Acute on chronic systolic (congestive) heart failure ([place])   . Atrial fibrillation ([place])   . Chronic anemia   . Chronic kidney disease (CKD), stage III (moderate) (HCC)   . Diabetes mellitus without complication ([place])   . Diverticulosis   . Gastroesophageal reflux   . Hyperlipidemia   . Hypertensive heart disease with heart failure ([place])   . Myocardial infarction ([place])   . OSA on CPAP   . Renal failure, chronic, stage 3 (moderate) (HCC)   . Systolic heart failure ([place])   . Thyroid disease     Past Surgical History:  Procedure Laterality Date  . CARDIAC CATHETERIZATION    . EXCISIONAL HEMORRHOIDECTOMY    . NOSE SURGERY      Current Medications: Current Meds  Medication Sig  . albuterol (PROVENTIL HFA;VENTOLIN HFA) 108 (90 Base) MCG/ACT inhaler Inhale 2 puffs into the lungs every 6 (six) hours as needed for wheezing or shortness of breath.  Marland Kitchen aspirin EC 81 MG tablet Take 81 mg by mouth daily.  . Baclofen 5 MG TABS Take 1 tablet by mouth 3 (three) times daily.  . clopidogrel (PLAVIX) 75 MG tablet Take 75 mg by mouth daily.  . Coenzyme Q10 100 MG TABS Take 100 mg by mouth daily at 12 noon.   . colchicine 0.6 MG tablet Take 0.6-1 mg by mouth See  admin instructions. 1.2mg  at onset of gout attack, then 0.6mg  an hour later. Next day 0.6mg  twice a day, then 0.6mg  daily until gout pain subsides.  . FeAsp-FeFum -Suc-C-Thre-B12-FA (MULTIGEN PLUS PO) Take 1 tablet by mouth daily.  . febuxostat (ULORIC) 40 MG tablet Take 40 mg by mouth daily.  . folic acid (FOLVITE) 373 MCG tablet Take 400 mcg by mouth daily at 12 noon.  . furosemide (LASIX) 40 MG tablet Take 60 mg by mouth 2 (two) times daily. 60 mg in the morning and 40 mg at noon  . Glucosamine-Chondroitin (OSTEO BI-FLEX REGULAR STRENGTH PO) Take 1 capsule by mouth daily at 12 noon.  . isosorbide mononitrate (IMDUR) 30 MG 24 hr tablet Take 30 mg by mouth daily.  . Levothyroxine Sodium 50 MCG CAPS Take 50 mcg by mouth daily before breakfast.   . multivitamin-lutein (OCUVITE-LUTEIN) CAPS capsule Take 1 capsule by mouth daily at 12 noon.  . nitroGLYCERIN (NITROSTAT) 0.4 MG SL tablet Place 0.4 mg under the tongue every 5 (five) minutes as needed for chest pain.  Marland Kitchen omega-3 acid ethyl esters (LOVAZA) 1 g capsule Take 1 g by mouth daily at 12 noon.  Marland Kitchen oxycodone (OXY-IR) 5 MG capsule Take 5 mg by mouth every 6 (six) hours as needed for pain.  . pantoprazole (PROTONIX) 40 MG tablet  Take 40 mg by mouth daily.  . potassium chloride SA (K-DUR,KLOR-CON) 20 MEQ tablet Take 20 mEq by mouth daily.  . pravastatin (PRAVACHOL) 40 MG tablet Take 40 mg by mouth daily.  . ranolazine (RANEXA) 1000 MG SR tablet Take 1,000 mg by mouth 2 (two) times daily.   . vitamin B-12 (CYANOCOBALAMIN) 1000 MCG tablet Take 1,000 mcg by mouth daily at 12 noon.  Marland Kitchen zolpidem (AMBIEN) 10 MG tablet Take 10 mg by mouth at bedtime as needed for sleep.     Allergies:   Ciprofloxacin and Statins   Social History   Socioeconomic History  . Marital status: Unknown    Spouse name: None  . Number of children: None  . Years of education: None  . Highest education level: None  Social Needs  . Financial resource strain: None  . Food  insecurity - worry: None  . Food insecurity - inability: None  . Transportation needs - medical: None  . Transportation needs - non-medical: None  Occupational History  . None  Tobacco Use  . Smoking status: Former Research scientist (life sciences)  . Smokeless tobacco: Never Used  Substance and Sexual Activity  . Alcohol use: No  . Drug use: No  . Sexual activity: None  Other Topics Concern  . None  Social History Narrative  . None     Family History: The patient's family history includes Breast cancer in her mother; Diabetes in her sister; Heart attack in her brother, maternal uncle, and sister; Hypertension in her father; Prostate cancer in her father; Stroke in her father. ROS:   Please see the history of present illness.    All 14 point review of systems negative except as described per history of present illness  EKGs/Labs/Other Studies Reviewed:      Recent Labs: 10/12/2016: BUN 53; Creatinine, Ser 2.69; Hemoglobin 10.7; Platelets 278; Potassium 4.8; Sodium 136  Recent Lipid Panel No results found for: CHOL, TRIG, HDL, CHOLHDL, VLDL, LDLCALC, LDLDIRECT  Physical Exam:    VS:  BP 130/66   Pulse (!) 58   Ht 5\' 3"  (1.6 m)   Wt 171 lb 12.8 oz (77.9 kg)   SpO2 98%   BMI 30.43 kg/m     Wt Readings from Last 3 Encounters:  01/13/17 171 lb 12.8 oz (77.9 kg)  11/03/16 168 lb (76.2 kg)  10/12/16 179 lb (81.2 kg)     GEN:  Well nourished, well developed in no acute distress HEENT: Normal NECK: No JVD; No carotid bruits LYMPHATICS: No lymphadenopathy CARDIAC: RRR, no murmurs, no rubs, no gallops RESPIRATORY:  Clear to auscultation without rales, wheezing or rhonchi  ABDOMEN: Soft, non-tender, non-distended MUSCULOSKELETAL:  No edema; No deformity  SKIN: Warm and dry LOWER EXTREMITIES: no swelling NEUROLOGIC:  Alert and oriented x 3 PSYCHIATRIC:  Normal affect   ASSESSMENT:    1. Paroxysmal atrial fibrillation (HCC)   2. Coronary artery disease involving native coronary artery of  native heart without angina pectoris    PLAN:    In order of problems listed above:  1. Paroxysmal atrial fibrillation seems to be in sinus rhythm today we will continue present management no anti-correlation because of bleeding.  I will check a CBC today. 2. Coronary artery disease: Stable continue present management.   Medication Adjustments/Labs and Tests Ordered: Current medicines are reviewed at length with the patient today.  Concerns regarding medicines are outlined above.  No orders of the defined types were placed in this encounter.  Medication changes: No orders  of the defined types were placed in this encounter.   Signed, Park Liter, MD, [facility] 01/13/2017 4:51 PM    [place]

## 2017-01-13 NOTE — Patient Instructions (Addendum)
Medication Instructions: Your physician recommends that you continue on your current medications as directed. Please refer to the Current Medication list given to you today.  Labwork: Your physician recommends that you have lab work today: CBC   Testing/Procedures: None ordered  Follow-Up: Your physician recommends that you schedule a follow-up appointment in: 6 weeks with Dr. Agustin Cree   Any Other Special Instructions Will Be Listed Below (If Applicable).     If you need a refill on your cardiac medications before your next appointment, please call your pharmacy.

## 2017-01-14 LAB — CBC
HEMATOCRIT: 29.1 % — AB (ref 34.0–46.6)
HEMOGLOBIN: 9.4 g/dL — AB (ref 11.1–15.9)
MCH: 31.1 pg (ref 26.6–33.0)
MCHC: 32.3 g/dL (ref 31.5–35.7)
MCV: 96 fL (ref 79–97)
Platelets: 341 10*3/uL (ref 150–379)
RBC: 3.02 x10E6/uL — ABNORMAL LOW (ref 3.77–5.28)
RDW: 15.6 % — AB (ref 12.3–15.4)
WBC: 5.7 10*3/uL (ref 3.4–10.8)

## 2017-01-15 ENCOUNTER — Emergency Department (HOSPITAL_COMMUNITY)
Admission: EM | Admit: 2017-01-15 | Discharge: 2017-01-15 | Disposition: A | Discharge status: Home / Self Care | Payer: Medicare Other | Attending: Emergency Medicine | Admitting: Emergency Medicine

## 2017-01-15 ENCOUNTER — Other Ambulatory Visit: Payer: Self-pay

## 2017-01-15 DIAGNOSIS — I5021 Acute systolic (congestive) heart failure: Secondary | ICD-10-CM | POA: Insufficient documentation

## 2017-01-15 DIAGNOSIS — N183 Chronic kidney disease, stage 3 (moderate): Secondary | ICD-10-CM | POA: Diagnosis not present

## 2017-01-15 DIAGNOSIS — I130 Hypertensive heart and chronic kidney disease with heart failure and stage 1 through stage 4 chronic kidney disease, or unspecified chronic kidney disease: Secondary | ICD-10-CM | POA: Diagnosis not present

## 2017-01-15 DIAGNOSIS — Z7982 Long term (current) use of aspirin: Secondary | ICD-10-CM | POA: Insufficient documentation

## 2017-01-15 DIAGNOSIS — M1612 Unilateral primary osteoarthritis, left hip: Secondary | ICD-10-CM | POA: Diagnosis not present

## 2017-01-15 DIAGNOSIS — Z79899 Other long term (current) drug therapy: Secondary | ICD-10-CM | POA: Diagnosis not present

## 2017-01-15 DIAGNOSIS — E119 Type 2 diabetes mellitus without complications: Secondary | ICD-10-CM | POA: Diagnosis not present

## 2017-01-15 DIAGNOSIS — R531 Weakness: Secondary | ICD-10-CM | POA: Diagnosis not present

## 2017-01-15 DIAGNOSIS — R55 Syncope and collapse: Secondary | ICD-10-CM | POA: Diagnosis not present

## 2017-01-15 DIAGNOSIS — R404 Transient alteration of awareness: Secondary | ICD-10-CM | POA: Diagnosis not present

## 2017-01-15 LAB — CBC WITH DIFFERENTIAL/PLATELET
Basophils Absolute: 0 10*3/uL (ref 0.0–0.1)
Basophils Relative: 0 %
EOS PCT: 7 %
Eosinophils Absolute: 0.4 10*3/uL (ref 0.0–0.7)
HCT: 28.7 % — ABNORMAL LOW (ref 36.0–46.0)
HEMOGLOBIN: 8.9 g/dL — AB (ref 12.0–15.0)
LYMPHS ABS: 1.1 10*3/uL (ref 0.7–4.0)
LYMPHS PCT: 18 %
MCH: 30.4 pg (ref 26.0–34.0)
MCHC: 31 g/dL (ref 30.0–36.0)
MCV: 98 fL (ref 78.0–100.0)
Monocytes Absolute: 0.6 10*3/uL (ref 0.1–1.0)
Monocytes Relative: 9 %
NEUTROS PCT: 66 %
Neutro Abs: 4 10*3/uL (ref 1.7–7.7)
Platelets: 273 10*3/uL (ref 150–400)
RBC: 2.93 MIL/uL — AB (ref 3.87–5.11)
RDW: 15.6 % — ABNORMAL HIGH (ref 11.5–15.5)
WBC: 6 10*3/uL (ref 4.0–10.5)

## 2017-01-15 LAB — BASIC METABOLIC PANEL
Anion gap: 10 (ref 5–15)
BUN: 40 mg/dL — ABNORMAL HIGH (ref 6–20)
CHLORIDE: 106 mmol/L (ref 101–111)
CO2: 23 mmol/L (ref 22–32)
Calcium: 9.9 mg/dL (ref 8.9–10.3)
Creatinine, Ser: 1.92 mg/dL — ABNORMAL HIGH (ref 0.44–1.00)
GFR calc Af Amer: 28 mL/min — ABNORMAL LOW (ref >60)
GFR calc non Af Amer: 24 mL/min — ABNORMAL LOW (ref >60)
GLUCOSE: 124 mg/dL — AB (ref 65–99)
POTASSIUM: 4.3 mmol/L (ref 3.5–5.1)
Sodium: 139 mmol/L (ref 135–145)

## 2017-01-15 LAB — CBG MONITORING, ED: Glucose-Capillary: 109 mg/dL — ABNORMAL HIGH (ref 65–99)

## 2017-01-15 MED ORDER — SODIUM CHLORIDE 0.9 % IV BOLUS (SEPSIS)
1000.0000 mL | Freq: Once | INTRAVENOUS | Status: AC
  Administered 2017-01-15: 1000 mL via INTRAVENOUS

## 2017-01-15 NOTE — ED Notes (Signed)
ED Provider at bedside. 

## 2017-01-15 NOTE — ED Triage Notes (Signed)
Pt BIB EMS from [facility] for syncopal episode. Pt reports feeling lightheaded/dizzy at time when she had LOC. Per EMS episodes happened while pt was standing; denies hitting head, or CP. Pt states something similar has happened in past when she was anemic and needed blood transfusion. Pt in NAD, resp e/u, A&Ox4.

## 2017-01-15 NOTE — ED Provider Notes (Signed)
[facility] EMERGENCY DEPARTMENT Provider Note   CSN: [id number] Arrival date & time: 01/15/17  1112     History   Chief Complaint Chief Complaint  Patient presents with  . Loss of Consciousness    HPI Amanda [name] is a 76 y.o. female.  76 yo F with a chief complaint of a syncopal event.  Patient was getting x-rays of her left hip for surgical planning when she suddenly felt lightheaded and had to sit down.  She then passed out approximately 3 separate times.  Patient does not remember those episodes.  She denied chest pain or shortness of breath.  She denied headaches or neck pain.  Patient feels better at this point.  She denies cough congestion fever.  Denies dark stool or blood in her stool.  Denies abdominal pain nausea or vomiting.  Has been eating and drinking normally.  She thinks she has a history of heart failure.  She also has atrial fibrillation.  Has had some chronic anemia that is being worked up and thought to be a slow GI bleed.  She also was recently taken off Eliquis after being entered in a study at [facility] that felt she was low risk for stroke.   The history is provided by the patient and the spouse.  Loss of Consciousness   This is a new problem. The current episode started less than 1 hour ago. The problem occurs constantly. The problem has not changed since onset.She lost consciousness for a period of 1 to 5 minutes. The problem is associated with normal activity. Associated symptoms include light-headedness. Pertinent negatives include chest pain, congestion, dizziness, fever, headaches, nausea, palpitations and vomiting. She has tried nothing for the symptoms. The treatment provided no relief.    Past Medical History:  Diagnosis Date  . Acute ischemic stroke ([place])   . Acute metabolic encephalopathy   . Acute on chronic systolic (congestive) heart failure ([place])   . Atrial fibrillation ([place])   . Chronic anemia   . Chronic kidney disease (CKD),  stage III (moderate) (HCC)   . Diabetes mellitus without complication ([place])   . Diverticulosis   . Gastroesophageal reflux   . Hyperlipidemia   . Hypertensive heart disease with heart failure ([place])   . Myocardial infarction ([place])   . OSA on CPAP   . Renal failure, chronic, stage 3 (moderate) (HCC)   . Systolic heart failure ([place])   . Thyroid disease     Patient Active Problem List   Diagnosis Date Noted  . Coronary artery disease 10/12/2016  . Acute renal insufficiency 09/02/2016  . LV dysfunction 09/02/2016  . GI bleed 08/06/2016  . NSTEMI (non-ST elevated myocardial infarction) ([place]) 08/06/2016  . Abnormal nuclear stress test 06/23/2016  . Chest pain 06/23/2016  . 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 Benign hypertension with CKD (chronic kidney disease) stage IV ([place]) 04/04/2015  . 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 Vitamin D deficiency 04/04/2015  . 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 Atrial fibrillation ([place]) 09/17/2014  . Bradycardia 09/17/2014  . Dyslipidemia 09/17/2014  . Obstructive sleep apnea 09/17/2014    Past Surgical History:  Procedure Laterality Date  . CARDIAC CATHETERIZATION    . EXCISIONAL HEMORRHOIDECTOMY    . NOSE SURGERY      OB History    No data available       Home Medications    Prior to  Admission medications   Medication Sig Start Date End Date Taking? Authorizing Provider  albuterol (PROVENTIL HFA;VENTOLIN HFA) 108 (90 Base) MCG/ACT inhaler Inhale 2 puffs into the lungs every 6 (six) hours as needed for wheezing or shortness of breath.   Yes [provider]  aspirin EC 81 MG tablet Take 81 mg by mouth daily.   Yes [provider]  cholecalciferol (VITAMIN D) 1000 units tablet Take 2,000 Units by mouth daily.   Yes [provider]  clopidogrel (PLAVIX) 75 MG tablet Take 75 mg by mouth daily.   Yes [provider]    Coenzyme Q10 100 MG TABS Take 100 mg by mouth daily at 12 noon.    Yes [provider]  colchicine 0.6 MG tablet Take 0.6-1 mg by mouth See admin instructions. 1.2mg  at onset of gout attack, then 0.6mg  an hour later. Next day 0.6mg  twice a day, then 0.6mg  daily until gout pain subsides.   Yes [provider]  FeAsp-FeFum -Suc-C-Thre-B12-FA (MULTIGEN PLUS PO) Take 1 tablet by mouth daily.   Yes [provider]  febuxostat (ULORIC) 40 MG tablet Take 40 mg by mouth daily.   Yes [provider]  folic acid (FOLVITE) 188 MCG tablet Take 800 mcg by mouth daily at 12 noon.    Yes [provider]  furosemide (LASIX) 40 MG tablet Take 60 mg by mouth 2 (two) times daily. 60 mg in the morning and 40 mg at noon   Yes [provider]  Glucosamine-Chondroitin (OSTEO BI-FLEX REGULAR STRENGTH PO) Take 1 capsule by mouth daily at 12 noon.   Yes [provider]  hydrALAZINE (APRESOLINE) 25 MG tablet Take 25 mg by mouth 3 (three) times daily.   Yes [provider]  isosorbide mononitrate (IMDUR) 30 MG 24 hr tablet Take 30 mg by mouth daily.   Yes [provider]  Levothyroxine Sodium 50 MCG CAPS Take 50 mcg by mouth daily before breakfast.    Yes [provider]  multivitamin-lutein (OCUVITE-LUTEIN) CAPS capsule Take 1 capsule by mouth daily at 12 noon.   Yes [provider]  omega-3 acid ethyl esters (LOVAZA) 1 g capsule Take 1 g by mouth daily at 12 noon.   Yes [provider]  pantoprazole (PROTONIX) 40 MG tablet Take 40 mg by mouth daily.   Yes [provider]  potassium chloride SA (K-DUR,KLOR-CON) 20 MEQ tablet Take 20 mEq by mouth daily.   Yes [provider]  pravastatin (PRAVACHOL) 40 MG tablet Take 40 mg by mouth daily.   Yes [provider]  Probiotic Product (PRO-BIOTIC BLEND PO) Take 1 capsule by mouth daily.   Yes [provider]  ranolazine (RANEXA) 1000 MG SR  tablet Take 1,000 mg by mouth 2 (two) times daily.    Yes [provider]  traMADol (ULTRAM) 50 MG tablet Take 50 mg by mouth every 6 (six) hours as needed for moderate pain.   Yes [provider]  vitamin B-12 (CYANOCOBALAMIN) 1000 MCG tablet Take 1,000 mcg by mouth daily at 12 noon.   Yes [provider]  zolpidem (AMBIEN) 10 MG tablet Take 10 mg by mouth at bedtime as needed for sleep.   Yes [provider]  nitroGLYCERIN (NITROSTAT) 0.4 MG SL tablet Place 0.4 mg under the tongue every 5 (five) minutes as needed for chest pain.    [provider]    Family History Family History  Problem Relation Age of Onset  .  Breast cancer Mother   . Hypertension Father   . Prostate cancer Father   . Stroke Father   . Diabetes Sister   . Heart attack Sister   . Heart attack Brother   . Heart attack Maternal Uncle     Social History Social History   Tobacco Use  . Smoking status: Former Research scientist (life sciences)  . Smokeless tobacco: Never Used  Substance Use Topics  . Alcohol use: No  . Drug use: No     Allergies   Ciprofloxacin and Statins   Review of Systems Review of Systems  Constitutional: Negative for chills and fever.  HENT: Negative for congestion and rhinorrhea.   Eyes: Negative for redness and visual disturbance.  Respiratory: Negative for shortness of breath and wheezing.   Cardiovascular: Positive for syncope. Negative for chest pain and palpitations.  Gastrointestinal: Negative for nausea and vomiting.  Genitourinary: Negative for dysuria and urgency.  Musculoskeletal: Negative for arthralgias and myalgias.  Skin: Negative for pallor and wound.  Neurological: Positive for syncope and light-headedness. Negative for dizziness and headaches.     Physical Exam Updated Vital Signs BP (!) 132/48   Pulse (!) 53   Temp 97.7 F (36.5 C)   Resp 12   Ht 5\' 3"  (1.6 m)   Wt 76.7 kg (169 lb)   SpO2 100%   BMI 29.94 kg/m   Physical Exam    Constitutional: She is oriented to person, place, and time. She appears well-developed and well-nourished. No distress.  HENT:  Head: Normocephalic and atraumatic.  Eyes: EOM are normal. Pupils are equal, round, and reactive to light.  Neck: Normal range of motion. Neck supple.  Cardiovascular: Normal rate and regular rhythm. Exam reveals no gallop and no friction rub.  No murmur heard. Pulmonary/Chest: Effort normal. She has no wheezes. She has no rales.  Abdominal: Soft. She exhibits no distension. There is no tenderness.  Musculoskeletal: She exhibits no edema or tenderness.  Neurological: She is alert and oriented to person, place, and time.  Skin: Skin is warm and dry. She is not diaphoretic.  Psychiatric: She has a normal mood and affect. Her behavior is normal.  Nursing note and vitals reviewed.    ED Treatments / Results  Labs (all labs ordered are listed, but only abnormal results are displayed) Labs Reviewed  CBC WITH DIFFERENTIAL/PLATELET - Abnormal; Notable for the following components:      Result Value   RBC 2.93 (*)    Hemoglobin 8.9 (*)    HCT 28.7 (*)    RDW 15.6 (*)    All other components within normal limits  BASIC METABOLIC PANEL - Abnormal; Notable for the following components:   Glucose, Bld 124 (*)    BUN 40 (*)    Creatinine, Ser 1.92 (*)    GFR calc non Af Amer 24 (*)    GFR calc Af Amer 28 (*)    All other components within normal limits  CBG MONITORING, ED - Abnormal; Notable for the following components:   Glucose-Capillary 109 (*)    All other components within normal limits    EKG  EKG Interpretation  Date/Time:  Friday January 15 2017 11:21:09 EST Ventricular Rate:  54 PR Interval:    QRS Duration: 104 QT Interval:  477 QTC Calculation: 453 R Axis:   156 Text Interpretation:  Sinus rhythm Right axis deviation Minimal ST depression no wpw, prolonged qt or brugada No old tracing to compare Confirmed by Deno Etienne [phone]) on 01/15/2017  11:28:51 AM       Radiology No results found.  Procedures Procedures (including critical care time)  Medications Ordered in ED Medications  sodium chloride 0.9 % bolus 1,000 mL (0 mLs Intravenous Stopped 01/15/17 1337)     Initial Impression / Assessment and Plan / ED Course  I have reviewed the triage vital signs and the nursing notes.  Pertinent labs & imaging results that were available during my care of the patient were reviewed by me and considered in my medical decision making (see chart for details).     76 yo F with a chief complaint of a syncopal event.  Sounds vasovagal by history.  Patient has a stated history of heart failure though her last EF was somewhere between 55 and 60%.  Patient is worried that this may have been a atrial fibrillation event.  Watched on the monitor without any recurrence.  Notably bradycardic throughout her stay here.  Patient was given some IV fluid.  Check labs because of her chronic anemia.  Appears unremarkable.  Patient continues to be asymptomatic.  No concerning findings on telemetry.  Will discharge home.  Follow-up with her cardiologist.  3:06 PM:  I have discussed the diagnosis/risks/treatment options with the patient and family and believe the pt to be eligible for discharge home to follow-up with PCP, Cards. We also discussed returning to the ED immediately if new or worsening sx occur. We discussed the sx which are most concerning (e.g., chest pain, shortness of breath, headache, neck pain, repeat syncopal event.) That necessitate immediate return. Medications administered to the patient during their visit and any new prescriptions provided to the patient are listed below.  Medications given during this visit Medications  sodium chloride 0.9 % bolus 1,000 mL (0 mLs Intravenous Stopped 01/15/17 1337)     The patient appears reasonably screen and/or stabilized for discharge and I doubt any other medical condition or other [facility]  requiring further screening, evaluation, or treatment in the ED at this time prior to discharge.    Final Clinical Impressions(s) / ED Diagnoses   Final diagnoses:  Syncope and collapse    ED Discharge Orders    None       Deno Etienne, DO 01/15/17 1506

## 2017-01-15 NOTE — Discharge Instructions (Signed)
Call your cardiologist today and discuss what happened to you.  They may want to put you on the Holter monitor.  They also might want to repeat an ultrasound of you as an outpatient.  Return for any worsening symptoms.

## 2017-01-21 DIAGNOSIS — M1612 Unilateral primary osteoarthritis, left hip: Secondary | ICD-10-CM | POA: Diagnosis not present

## 2017-01-21 DIAGNOSIS — M65332 Trigger finger, left middle finger: Secondary | ICD-10-CM | POA: Diagnosis not present

## 2017-01-21 DIAGNOSIS — M25552 Pain in left hip: Secondary | ICD-10-CM | POA: Diagnosis not present

## 2017-02-04 ENCOUNTER — Other Ambulatory Visit: Payer: Self-pay

## 2017-02-04 MED ORDER — ISOSORBIDE MONONITRATE ER 30 MG PO TB24: 30.0000 mg | ORAL_TABLET | Freq: Every day | ORAL | 6 refills | Status: DC

## 2017-02-15 ENCOUNTER — Telehealth: Payer: Self-pay

## 2017-02-15 MED ORDER — AMOXICILLIN 500 MG PO CAPS: 2000.0000 mg | ORAL_CAPSULE | Freq: Once | ORAL | 2 refills | Status: AC

## 2017-02-15 NOTE — Telephone Encounter (Signed)
Patient called stating she is having her teeth cleaned on Friday and needs an antibiotic. Reviewed with Dr. Agustin Cree and verbal order given for amoxicillin 2 g, 30 minutes prior to procedure. Advised patient that amoxicillin 500 mg tablets sent to [facility] in [place]. Advised to take 4 capsules 30 minutes prior to procedure. Patient verbalized understanding, no further questions.

## 2017-02-24 ENCOUNTER — Ambulatory Visit: Payer: Medicare Other | Admitting: Cardiology

## 2017-02-24 ENCOUNTER — Encounter: Payer: Self-pay | Admitting: Cardiology

## 2017-02-24 VITALS — BP 120/50 | HR 68 | Ht 63.0 in | Wt 169.0 lb

## 2017-02-24 DIAGNOSIS — I480 Paroxysmal atrial fibrillation: Secondary | ICD-10-CM | POA: Diagnosis not present

## 2017-02-24 DIAGNOSIS — I519 Heart disease, unspecified: Secondary | ICD-10-CM | POA: Diagnosis not present

## 2017-02-24 DIAGNOSIS — I2510 Atherosclerotic heart disease of native coronary artery without angina pectoris: Secondary | ICD-10-CM

## 2017-02-24 NOTE — Patient Instructions (Signed)
Medication Instructions:  Your physician recommends that you continue on your current medications as directed. Please refer to the Current Medication list given to you today  Labwork: Your physician recommends that you have lab work today: CBC  Testing/Procedures: None ordered  Follow-Up: Your physician recommends that you schedule a follow-up appointment in: 4 months with Dr. Agustin Cree   Any Other Special Instructions Will Be Listed Below (If Applicable).     If you need a refill on your cardiac medications before your next appointment, please call your pharmacy.

## 2017-02-24 NOTE — Progress Notes (Signed)
Cardiology Office Note:    Date:  02/24/2017   ID:  Amanda [name], DOB [birth year], MRN [id number]  PCP:  Cyndy Freeze, MD  Cardiologist:  Jenne Campus, MD    Referring MD: Cyndy Freeze, MD   Chief Complaint  Patient presents with  . Follow-up  Doing well  History of Present Illness:    Amanda [name] is a 77 y.o. female with coronary artery disease: Status post CVA: Status post watchman device done in all October 2018.  Doing well described to have rare episode of short lasting palpitations chest pain tightness squeezing pressure burning chest.  Overall she is doing quite well.  Past Medical History:  Diagnosis Date  . Acute ischemic stroke ([place])   . Acute metabolic encephalopathy   . Acute on chronic systolic (congestive) heart failure ([place])   . Atrial fibrillation ([place])   . Chronic anemia   . Chronic kidney disease (CKD), stage III (moderate) (HCC)   . Diabetes mellitus without complication ([place])   . Diverticulosis   . Gastroesophageal reflux   . Hyperlipidemia   . Hypertensive heart disease with heart failure ([place])   . Myocardial infarction ([place])   . OSA on CPAP   . Renal failure, chronic, stage 3 (moderate) (HCC)   . Systolic heart failure ([place])   . Thyroid disease     Past Surgical History:  Procedure Laterality Date  . CARDIAC CATHETERIZATION    . EXCISIONAL HEMORRHOIDECTOMY    . NOSE SURGERY      Current Medications: Current Meds  Medication Sig  . albuterol (PROVENTIL HFA;VENTOLIN HFA) 108 (90 Base) MCG/ACT inhaler Inhale 2 puffs into the lungs every 6 (six) hours as needed for wheezing or shortness of breath.  Marland Kitchen aspirin EC 81 MG tablet Take 81 mg by mouth daily.  . cholecalciferol (VITAMIN D) 1000 units tablet Take 2,000 Units by mouth daily.  . clopidogrel (PLAVIX) 75 MG tablet Take 75 mg by mouth daily.  . Coenzyme Q10 100 MG TABS Take 100 mg by mouth daily at 12 noon.   . colchicine 0.6 MG tablet Take 0.6-1 mg by mouth See admin  instructions. 1.2mg  at onset of gout attack, then 0.6mg  an hour later. Next day 0.6mg  twice a day, then 0.6mg  daily until gout pain subsides.  . FeAsp-FeFum -Suc-C-Thre-B12-FA (MULTIGEN PLUS PO) Take 1 tablet by mouth daily.  . febuxostat (ULORIC) 40 MG tablet Take 40 mg by mouth daily.  . folic acid (FOLVITE) 785 MCG tablet Take 800 mcg by mouth daily at 12 noon.   . furosemide (LASIX) 40 MG tablet Take 60 mg by mouth 2 (two) times daily. 60 mg in the morning and 40 mg at noon  . Glucosamine-Chondroitin (OSTEO BI-FLEX REGULAR STRENGTH PO) Take 1 capsule by mouth daily at 12 noon.  . hydrALAZINE (APRESOLINE) 25 MG tablet Take 25 mg by mouth 3 (three) times daily.  . isosorbide mononitrate (IMDUR) 30 MG 24 hr tablet Take 1 tablet (30 mg total) by mouth daily.  . Levothyroxine Sodium 50 MCG CAPS Take 50 mcg by mouth daily before breakfast.   . multivitamin-lutein (OCUVITE-LUTEIN) CAPS capsule Take 1 capsule by mouth daily at 12 noon.  . nitroGLYCERIN (NITROSTAT) 0.4 MG SL tablet Place 0.4 mg under the tongue every 5 (five) minutes as needed for chest pain.  Marland Kitchen omega-3 acid ethyl esters (LOVAZA) 1 g capsule Take 1 g by mouth daily at 12 noon.  . pantoprazole (PROTONIX) 40 MG tablet Take 40 mg by mouth daily.  Marland Kitchen  potassium chloride SA (K-DUR,KLOR-CON) 20 MEQ tablet Take 20 mEq by mouth daily.  . pravastatin (PRAVACHOL) 40 MG tablet Take 40 mg by mouth daily.  . Probiotic Product (PRO-BIOTIC BLEND PO) Take 1 capsule by mouth daily.  . ranolazine (RANEXA) 1000 MG SR tablet Take 1,000 mg by mouth 2 (two) times daily.   . traMADol (ULTRAM) 50 MG tablet Take 50 mg by mouth every 12 (twelve) hours as needed for moderate pain.   . vitamin B-12 (CYANOCOBALAMIN) 1000 MCG tablet Take 1,000 mcg by mouth daily at 12 noon.  Marland Kitchen zolpidem (AMBIEN) 10 MG tablet Take 10 mg by mouth at bedtime as needed for sleep.     Allergies:   Ciprofloxacin and Statins   Social History   Socioeconomic History  . Marital status:  Unknown    Spouse name: None  . Number of children: None  . Years of education: None  . Highest education level: None  Social Needs  . Financial resource strain: None  . Food insecurity - worry: None  . Food insecurity - inability: None  . Transportation needs - medical: None  . Transportation needs - non-medical: None  Occupational History  . None  Tobacco Use  . Smoking status: Former Research scientist (life sciences)  . Smokeless tobacco: Never Used  Substance and Sexual Activity  . Alcohol use: No  . Drug use: No  . Sexual activity: None  Other Topics Concern  . None  Social History Narrative  . None     Family History: The patient's family history includes Breast cancer in her mother; Diabetes in her sister; Heart attack in her brother, maternal uncle, and sister; Hypertension in her father; Prostate cancer in her father; Stroke in her father. ROS:   Please see the history of present illness.    All 14 point review of systems negative except as described per history of present illness  EKGs/Labs/Other Studies Reviewed:      Recent Labs: 01/15/2017: BUN 40; Creatinine, Ser 1.92; Hemoglobin 8.9; Platelets 273; Potassium 4.3; Sodium 139  Recent Lipid Panel No results found for: CHOL, TRIG, HDL, CHOLHDL, VLDL, LDLCALC, LDLDIRECT  Physical Exam:    VS:  BP (!) 120/50 (BP Location: Right Arm, Patient Position: Sitting, Cuff Size: Normal)   Pulse 68   Ht 5\' 3"  (1.6 m)   Wt 169 lb (76.7 kg)   SpO2 98%   BMI 29.94 kg/m     Wt Readings from Last 3 Encounters:  02/24/17 169 lb (76.7 kg)  01/15/17 169 lb (76.7 kg)  01/13/17 171 lb 12.8 oz (77.9 kg)     GEN:  Well nourished, well developed in no acute distress HEENT: Normal NECK: No JVD; No carotid bruits LYMPHATICS: No lymphadenopathy CARDIAC: RRR, no murmurs, no rubs, no gallops RESPIRATORY:  Clear to auscultation without rales, wheezing or rhonchi  ABDOMEN: Soft, non-tender, non-distended MUSCULOSKELETAL:  No edema; No deformity    SKIN: Warm and dry LOWER EXTREMITIES: no swelling NEUROLOGIC:  Alert and oriented x 3 PSYCHIATRIC:  Normal affect   ASSESSMENT:    1. Paroxysmal atrial fibrillation (HCC)   2. LV dysfunction   3. Coronary artery disease involving native coronary artery of native heart without angina pectoris    PLAN:    In order of problems listed above:  1. Paroxysmal atrial fibrillation: Heart rate seems to be regular she is off anticoagulation status post watchman device.  She is on aspirin and Plavix but will continue for another 2 months. 2. LV dysfunction on  appropriate medications continue. 3. History of anemia: We will check her CBC today. 4. This lipidemia: I will ask her to have fasting lipid profile done.  She wants to have it done at her primary care physician office will request copy of it.   Medication Adjustments/Labs and Tests Ordered: Current medicines are reviewed at length with the patient today.  Concerns regarding medicines are outlined above.  No orders of the defined types were placed in this encounter.  Medication changes: No orders of the defined types were placed in this encounter.   Signed, Park Liter, MD, [facility] 02/24/2017 3:02 PM    [facility]

## 2017-02-25 LAB — CBC
Hematocrit: 26.9 % — ABNORMAL LOW (ref 34.0–46.6)
Hemoglobin: 8.7 g/dL — ABNORMAL LOW (ref 11.1–15.9)
MCH: 30.2 pg (ref 26.6–33.0)
MCHC: 32.3 g/dL (ref 31.5–35.7)
MCV: 93 fL (ref 79–97)
PLATELETS: 395 10*3/uL — AB (ref 150–379)
RBC: 2.88 x10E6/uL — AB (ref 3.77–5.28)
RDW: 13.9 % (ref 12.3–15.4)
WBC: 7.5 10*3/uL (ref 3.4–10.8)

## 2017-03-01 DIAGNOSIS — E782 Mixed hyperlipidemia: Secondary | ICD-10-CM | POA: Diagnosis not present

## 2017-03-01 DIAGNOSIS — M65331 Trigger finger, right middle finger: Secondary | ICD-10-CM | POA: Diagnosis not present

## 2017-03-05 MED ORDER — EZETIMIBE 10 MG PO TABS: 10.0000 mg | ORAL_TABLET | Freq: Every day | ORAL | 6 refills | Status: DC

## 2017-03-19 DIAGNOSIS — Z1231 Encounter for screening mammogram for malignant neoplasm of breast: Secondary | ICD-10-CM | POA: Diagnosis not present

## 2017-03-29 DIAGNOSIS — R5381 Other malaise: Secondary | ICD-10-CM | POA: Diagnosis not present

## 2017-03-30 DIAGNOSIS — H18411 Arcus senilis, right eye: Secondary | ICD-10-CM | POA: Diagnosis not present

## 2017-03-30 DIAGNOSIS — H2513 Age-related nuclear cataract, bilateral: Secondary | ICD-10-CM | POA: Diagnosis not present

## 2017-03-30 DIAGNOSIS — H25043 Posterior subcapsular polar age-related cataract, bilateral: Secondary | ICD-10-CM | POA: Diagnosis not present

## 2017-03-30 DIAGNOSIS — H25013 Cortical age-related cataract, bilateral: Secondary | ICD-10-CM | POA: Diagnosis not present

## 2017-04-01 DIAGNOSIS — N184 Chronic kidney disease, stage 4 (severe): Secondary | ICD-10-CM | POA: Diagnosis not present

## 2017-04-01 DIAGNOSIS — D631 Anemia in chronic kidney disease: Secondary | ICD-10-CM | POA: Diagnosis not present

## 2017-04-01 DIAGNOSIS — I130 Hypertensive heart and chronic kidney disease with heart failure and stage 1 through stage 4 chronic kidney disease, or unspecified chronic kidney disease: Secondary | ICD-10-CM | POA: Diagnosis not present

## 2017-04-01 DIAGNOSIS — E1122 Type 2 diabetes mellitus with diabetic chronic kidney disease: Secondary | ICD-10-CM | POA: Diagnosis not present

## 2017-04-15 DIAGNOSIS — D631 Anemia in chronic kidney disease: Secondary | ICD-10-CM | POA: Diagnosis not present

## 2017-04-15 DIAGNOSIS — N184 Chronic kidney disease, stage 4 (severe): Secondary | ICD-10-CM | POA: Diagnosis not present

## 2017-04-16 ENCOUNTER — Other Ambulatory Visit: Payer: Self-pay

## 2017-04-16 MED ORDER — RANOLAZINE ER 1000 MG PO TB12: 1000.0000 mg | ORAL_TABLET | Freq: Two times a day (BID) | ORAL | 6 refills | Status: DC

## 2017-04-26 DIAGNOSIS — N184 Chronic kidney disease, stage 4 (severe): Secondary | ICD-10-CM | POA: Diagnosis not present

## 2017-04-29 DIAGNOSIS — D631 Anemia in chronic kidney disease: Secondary | ICD-10-CM | POA: Diagnosis not present

## 2017-04-29 DIAGNOSIS — N184 Chronic kidney disease, stage 4 (severe): Secondary | ICD-10-CM | POA: Diagnosis not present

## 2017-05-14 DIAGNOSIS — Z6829 Body mass index (BMI) 29.0-29.9, adult: Secondary | ICD-10-CM | POA: Diagnosis not present

## 2017-05-14 DIAGNOSIS — N3000 Acute cystitis without hematuria: Secondary | ICD-10-CM | POA: Diagnosis not present

## 2017-05-17 DIAGNOSIS — D631 Anemia in chronic kidney disease: Secondary | ICD-10-CM | POA: Diagnosis not present

## 2017-05-17 DIAGNOSIS — I110 Hypertensive heart disease with heart failure: Secondary | ICD-10-CM | POA: Diagnosis not present

## 2017-05-17 DIAGNOSIS — E782 Mixed hyperlipidemia: Secondary | ICD-10-CM | POA: Diagnosis not present

## 2017-05-17 DIAGNOSIS — N184 Chronic kidney disease, stage 4 (severe): Secondary | ICD-10-CM | POA: Diagnosis not present

## 2017-05-17 DIAGNOSIS — E1122 Type 2 diabetes mellitus with diabetic chronic kidney disease: Secondary | ICD-10-CM | POA: Diagnosis not present

## 2017-05-20 DIAGNOSIS — Z6829 Body mass index (BMI) 29.0-29.9, adult: Secondary | ICD-10-CM | POA: Diagnosis not present

## 2017-05-20 DIAGNOSIS — R030 Elevated blood-pressure reading, without diagnosis of hypertension: Secondary | ICD-10-CM | POA: Diagnosis not present

## 2017-05-20 DIAGNOSIS — D649 Anemia, unspecified: Secondary | ICD-10-CM | POA: Diagnosis not present

## 2017-05-20 DIAGNOSIS — E663 Overweight: Secondary | ICD-10-CM | POA: Diagnosis not present

## 2017-05-27 ENCOUNTER — Other Ambulatory Visit: Payer: Self-pay

## 2017-05-27 DIAGNOSIS — D649 Anemia, unspecified: Secondary | ICD-10-CM | POA: Diagnosis not present

## 2017-05-27 MED ORDER — OMEGA-3-ACID ETHYL ESTERS 1 G PO CAPS: 1.0000 g | ORAL_CAPSULE | Freq: Every day | ORAL | 6 refills | Status: DC

## 2017-05-31 DIAGNOSIS — D631 Anemia in chronic kidney disease: Secondary | ICD-10-CM | POA: Diagnosis not present

## 2017-05-31 DIAGNOSIS — N184 Chronic kidney disease, stage 4 (severe): Secondary | ICD-10-CM | POA: Diagnosis not present

## 2017-05-31 DIAGNOSIS — R300 Dysuria: Secondary | ICD-10-CM | POA: Diagnosis not present

## 2017-06-14 DIAGNOSIS — D631 Anemia in chronic kidney disease: Secondary | ICD-10-CM | POA: Diagnosis not present

## 2017-06-14 DIAGNOSIS — N184 Chronic kidney disease, stage 4 (severe): Secondary | ICD-10-CM | POA: Diagnosis not present

## 2017-06-17 DIAGNOSIS — R8279 Other abnormal findings on microbiological examination of urine: Secondary | ICD-10-CM | POA: Diagnosis not present

## 2017-06-17 DIAGNOSIS — N3020 Other chronic cystitis without hematuria: Secondary | ICD-10-CM | POA: Diagnosis not present

## 2017-06-21 ENCOUNTER — Telehealth: Payer: Self-pay | Admitting: Cardiology

## 2017-06-21 DIAGNOSIS — E782 Mixed hyperlipidemia: Secondary | ICD-10-CM | POA: Diagnosis not present

## 2017-06-21 DIAGNOSIS — E1122 Type 2 diabetes mellitus with diabetic chronic kidney disease: Secondary | ICD-10-CM | POA: Diagnosis not present

## 2017-06-21 DIAGNOSIS — Z79899 Other long term (current) drug therapy: Secondary | ICD-10-CM | POA: Diagnosis not present

## 2017-06-21 DIAGNOSIS — M109 Gout, unspecified: Secondary | ICD-10-CM | POA: Diagnosis not present

## 2017-06-21 DIAGNOSIS — E039 Hypothyroidism, unspecified: Secondary | ICD-10-CM | POA: Diagnosis not present

## 2017-06-21 NOTE — Telephone Encounter (Signed)
Dr. Agustin Cree patient.

## 2017-06-21 NOTE — Telephone Encounter (Signed)
Patient has follow up appointment on 06/24/17 at 10:40. Patient advised since she started a new cholesterol medication, zetia, since her last office visit in January Dr. Agustin Cree will more than likely recheck her lipid panel. Advised patient to fast before appointment. Patient verbalized understanding. No further questions.

## 2017-06-21 NOTE — Telephone Encounter (Signed)
[place] to see if patient needs labs. She has labs for another doctor and wants to be stuck once.

## 2017-06-22 DIAGNOSIS — N184 Chronic kidney disease, stage 4 (severe): Secondary | ICD-10-CM | POA: Diagnosis not present

## 2017-06-22 DIAGNOSIS — D631 Anemia in chronic kidney disease: Secondary | ICD-10-CM | POA: Diagnosis not present

## 2017-06-22 DIAGNOSIS — N841 Polyp of cervix uteri: Secondary | ICD-10-CM | POA: Diagnosis not present

## 2017-06-22 DIAGNOSIS — N8110 Cystocele, unspecified: Secondary | ICD-10-CM | POA: Diagnosis not present

## 2017-06-24 ENCOUNTER — Ambulatory Visit: Payer: Medicare Other | Admitting: Cardiology

## 2017-06-24 ENCOUNTER — Encounter: Payer: Self-pay | Admitting: Cardiology

## 2017-06-24 VITALS — BP 130/62 | HR 63 | Ht 63.0 in | Wt 167.8 lb

## 2017-06-24 DIAGNOSIS — I2510 Atherosclerotic heart disease of native coronary artery without angina pectoris: Secondary | ICD-10-CM | POA: Diagnosis not present

## 2017-06-24 DIAGNOSIS — I129 Hypertensive chronic kidney disease with stage 1 through stage 4 chronic kidney disease, or unspecified chronic kidney disease: Secondary | ICD-10-CM

## 2017-06-24 DIAGNOSIS — I519 Heart disease, unspecified: Secondary | ICD-10-CM

## 2017-06-24 DIAGNOSIS — D631 Anemia in chronic kidney disease: Secondary | ICD-10-CM

## 2017-06-24 DIAGNOSIS — N184 Chronic kidney disease, stage 4 (severe): Secondary | ICD-10-CM

## 2017-06-24 DIAGNOSIS — I480 Paroxysmal atrial fibrillation: Secondary | ICD-10-CM

## 2017-06-24 DIAGNOSIS — Z95818 Presence of other cardiac implants and grafts: Secondary | ICD-10-CM | POA: Diagnosis not present

## 2017-06-24 HISTORY — DX: Presence of other cardiac implants and grafts: Z95.818

## 2017-06-24 NOTE — Progress Notes (Signed)
Cardiology Office Note:    Date:  06/24/2017   ID:  Amanda [name], DOB 1940-12-01, MRN [id number]  PCP:  Street, Sharon Mt, MD  Cardiologist:  Jenne Campus, MD    Referring MD: Cyndy Freeze, MD   Chief Complaint  Patient presents with  . Follow-up  Doing well cardiac wise described to have rare palpitations but overall doing fine  History of Present Illness:    Amanda [name] is a 77 y.o. female coronary artery disease cardia myopathy paroxysmal atrial fibrillation chronic anemia with bleeding.  Status post watchman device.  Cardiac wise doing fine described to have rare episode of palpitations no sustained arrhythmias fatigue tired but no unusual shortness of breath tightness squeezing pressure burning chest  Past Medical History:  Diagnosis Date  . Acute ischemic stroke ([place])   . Acute metabolic encephalopathy   . Acute on chronic systolic (congestive) heart failure ([place])   . Atrial fibrillation ([place])   . Chronic anemia   . Chronic kidney disease (CKD), stage III (moderate) (HCC)   . Diabetes mellitus without complication ([place])   . Diverticulosis   . Gastroesophageal reflux   . Hyperlipidemia   . Hypertensive heart disease with heart failure ([place])   . Myocardial infarction ([place])   . OSA on CPAP   . Renal failure, chronic, stage 3 (moderate) (HCC)   . Systolic heart failure ([place])   . Thyroid disease     Past Surgical History:  Procedure Laterality Date  . CARDIAC CATHETERIZATION    . EXCISIONAL HEMORRHOIDECTOMY    . NOSE SURGERY      Current Medications: Current Meds  Medication Sig  . albuterol (PROVENTIL HFA;VENTOLIN HFA) 108 (90 Base) MCG/ACT inhaler Inhale 2 puffs into the lungs every 6 (six) hours as needed for wheezing or shortness of breath.  Marland Kitchen aspirin EC 81 MG tablet Take 81 mg by mouth daily.  . cholecalciferol (VITAMIN D) 1000 units tablet Take 2,000 Units by mouth daily.  . clopidogrel (PLAVIX) 75 MG tablet Take 75 mg by mouth daily.  .  Coenzyme Q10 100 MG TABS Take 100 mg by mouth daily at 12 noon.   . colchicine 0.6 MG tablet Take 0.6-1 mg by mouth See admin instructions. 1.2mg  at onset of gout attack, then 0.6mg  an hour later. Next day 0.6mg  twice a day, then 0.6mg  daily until gout pain subsides.  Marland Kitchen ezetimibe (ZETIA) 10 MG tablet Take 1 tablet (10 mg total) by mouth daily.  . FeAsp-FeFum -Suc-C-Thre-B12-FA (MULTIGEN PLUS PO) Take 1 tablet by mouth daily.  . febuxostat (ULORIC) 40 MG tablet Take 40 mg by mouth daily.  . folic acid (FOLVITE) 277 MCG tablet Take 800 mcg by mouth daily at 12 noon.   . furosemide (LASIX) 40 MG tablet Take 60 mg by mouth 2 (two) times daily. 60 mg in the morning and 40 mg at noon  . Glucosamine-Chondroitin (OSTEO BI-FLEX REGULAR STRENGTH PO) Take 1 capsule by mouth daily at 12 noon.  . hydrALAZINE (APRESOLINE) 25 MG tablet Take 25 mg by mouth 3 (three) times daily.  . isosorbide mononitrate (IMDUR) 30 MG 24 hr tablet Take 1 tablet (30 mg total) by mouth daily.  . Levothyroxine Sodium 50 MCG CAPS Take 50 mcg by mouth daily before breakfast.   . multivitamin-lutein (OCUVITE-LUTEIN) CAPS capsule Take 1 capsule by mouth daily at 12 noon.  . nitroGLYCERIN (NITROSTAT) 0.4 MG SL tablet Place 0.4 mg under the tongue every 5 (five) minutes as needed for chest pain.  Marland Kitchen  omega-3 acid ethyl esters (LOVAZA) 1 g capsule Take 1 capsule (1 g total) by mouth daily at 12 noon.  . pantoprazole (PROTONIX) 40 MG tablet Take 40 mg by mouth daily.  . potassium chloride SA (K-DUR,KLOR-CON) 20 MEQ tablet Take 20 mEq by mouth daily.  . pravastatin (PRAVACHOL) 40 MG tablet Take 40 mg by mouth daily.  . Probiotic Product (PRO-BIOTIC BLEND PO) Take 1 capsule by mouth daily.  . ranolazine (RANEXA) 1000 MG SR tablet Take 1 tablet (1,000 mg total) by mouth 2 (two) times daily.  . traMADol (ULTRAM) 50 MG tablet Take 50 mg by mouth every 12 (twelve) hours as needed for moderate pain.   Marland Kitchen trimethoprim (TRIMPEX) 100 MG tablet Take 1  tablet by mouth daily.  . vitamin B-12 (CYANOCOBALAMIN) 1000 MCG tablet Take 1,000 mcg by mouth daily at 12 noon.  Marland Kitchen zolpidem (AMBIEN) 10 MG tablet Take 10 mg by mouth at bedtime as needed for sleep.     Allergies:   Ciprofloxacin and Statins   Social History   Socioeconomic History  . Marital status: Unknown    Spouse name: Not on file  . Number of children: Not on file  . Years of education: Not on file  . Highest education level: Not on file  Occupational History  . Not on file  Social Needs  . Financial resource strain: Not on file  . Food insecurity:    Worry: Not on file    Inability: Not on file  . Transportation needs:    Medical: Not on file    Non-medical: Not on file  Tobacco Use  . Smoking status: Former Research scientist (life sciences)  . Smokeless tobacco: Never Used  Substance and Sexual Activity  . Alcohol use: No  . Drug use: No  . Sexual activity: Not on file  Lifestyle  . Physical activity:    Days per week: Not on file    Minutes per session: Not on file  . Stress: Not on file  Relationships  . Social connections:    Talks on phone: Not on file    Gets together: Not on file    Attends religious service: Not on file    Active member of club or organization: Not on file    Attends meetings of clubs or organizations: Not on file    Relationship status: Not on file  Other Topics Concern  . Not on file  Social History Narrative  . Not on file     Family History: The patient's family history includes Breast cancer in her mother; Diabetes in her sister; Heart attack in her brother, maternal uncle, and sister; Hypertension in her father; Prostate cancer in her father; Stroke in her father. ROS:   Please see the history of present illness.    All 14 point review of systems negative except as described per history of present illness  EKGs/Labs/Other Studies Reviewed:      Recent Labs: 01/15/2017: BUN 40; Creatinine, Ser 1.92; Potassium 4.3; Sodium 139 02/24/2017:  Hemoglobin 8.7; Platelets 395  Recent Lipid Panel No results found for: CHOL, TRIG, HDL, CHOLHDL, VLDL, LDLCALC, LDLDIRECT  Physical Exam:    VS:  BP 130/62   Pulse 63   Ht 5\' 3"  (1.6 m)   Wt 167 lb 12.8 oz (76.1 kg)   SpO2 99%   BMI 29.72 kg/m     Wt Readings from Last 3 Encounters:  06/24/17 167 lb 12.8 oz (76.1 kg)  02/24/17 169 lb (76.7 kg)  01/15/17 169 lb (76.7 kg)     GEN:  Well nourished, well developed in no acute distress HEENT: Normal NECK: No JVD; No carotid bruits LYMPHATICS: No lymphadenopathy CARDIAC: RRR, no murmurs, no rubs, no gallops RESPIRATORY:  Clear to auscultation without rales, wheezing or rhonchi  ABDOMEN: Soft, non-tender, non-distended MUSCULOSKELETAL:  No edema; No deformity  SKIN: Warm and dry LOWER EXTREMITIES: no swelling NEUROLOGIC:  Alert and oriented x 3 PSYCHIATRIC:  Normal affect   ASSESSMENT:    1. Paroxysmal atrial fibrillation (HCC)   2. Presence of Watchman left atrial appendage closure device   3. LV dysfunction   4. Coronary artery disease involving native coronary artery of native heart without angina pectoris   5. Benign hypertension with CKD (chronic kidney disease) stage IV ([place])   6. Anemia due to stage 4 chronic kidney disease ([place])    PLAN:    In order of problems listed above:  1. Paroxysmal atrial fibrillation not anticoagulated secondary to multiple bleeding does have watchman device.  We will continue present management 2. LV dysfunction.  She will required hip replacement surgery I asked her to let us know when surgery will happen so we will repeat echocardiogram which anticipated to do this in about 3 months. 3. Coronary artery disease: Stable we will continue present management. 4. Essential hypertension: Blood pressure well controlled continue present management. 5. Anemia: She is seeing nephrologist next week.   Medication Adjustments/Labs and Tests Ordered: Current medicines are reviewed at length with  the patient today.  Concerns regarding medicines are outlined above.  No orders of the defined types were placed in this encounter.  Medication changes: No orders of the defined types were placed in this encounter.   Signed, Park Liter, MD, [facility] 06/24/2017 11:32 AM    [place]

## 2017-06-24 NOTE — Patient Instructions (Signed)
Medication Instructions:  Your physician recommends that you continue on your current medications as directed. Please refer to the Current Medication list given to you today.   Labwork: Your physician recommends that you return for lab work today: CBC.  Testing/Procedures: None  Follow-Up: Your physician wants you to follow-up in: 3 months. You will receive a reminder letter in the mail two months in advance. If you don't receive a letter, please call our office to schedule the follow-up appointment.   If you need a refill on your cardiac medications before your next appointment, please call your pharmacy.   Thank you for choosing [facility]! Robyne Peers, RN [phone]

## 2017-06-25 ENCOUNTER — Encounter: Payer: Self-pay | Admitting: *Deleted

## 2017-06-25 LAB — CBC
HEMATOCRIT: 23.4 % — AB (ref 34.0–46.6)
Hemoglobin: 7.3 g/dL — ABNORMAL LOW (ref 11.1–15.9)
MCH: 27.2 pg (ref 26.6–33.0)
MCHC: 31.2 g/dL — ABNORMAL LOW (ref 31.5–35.7)
MCV: 87 fL (ref 79–97)
PLATELETS: 451 10*3/uL — AB (ref 150–450)
RBC: 2.68 x10E6/uL — CL (ref 3.77–5.28)
RDW: 16.3 % — AB (ref 12.3–15.4)
WBC: 5.8 10*3/uL (ref 3.4–10.8)

## 2017-06-30 DIAGNOSIS — I5020 Unspecified systolic (congestive) heart failure: Secondary | ICD-10-CM | POA: Diagnosis not present

## 2017-06-30 DIAGNOSIS — N184 Chronic kidney disease, stage 4 (severe): Secondary | ICD-10-CM | POA: Diagnosis not present

## 2017-06-30 DIAGNOSIS — Z1211 Encounter for screening for malignant neoplasm of colon: Secondary | ICD-10-CM | POA: Diagnosis not present

## 2017-06-30 DIAGNOSIS — I129 Hypertensive chronic kidney disease with stage 1 through stage 4 chronic kidney disease, or unspecified chronic kidney disease: Secondary | ICD-10-CM | POA: Diagnosis not present

## 2017-06-30 DIAGNOSIS — I130 Hypertensive heart and chronic kidney disease with heart failure and stage 1 through stage 4 chronic kidney disease, or unspecified chronic kidney disease: Secondary | ICD-10-CM | POA: Diagnosis not present

## 2017-06-30 DIAGNOSIS — D631 Anemia in chronic kidney disease: Secondary | ICD-10-CM | POA: Diagnosis not present

## 2017-07-08 DIAGNOSIS — D509 Iron deficiency anemia, unspecified: Secondary | ICD-10-CM | POA: Diagnosis not present

## 2017-07-15 DIAGNOSIS — D509 Iron deficiency anemia, unspecified: Secondary | ICD-10-CM | POA: Diagnosis not present

## 2017-07-15 DIAGNOSIS — D631 Anemia in chronic kidney disease: Secondary | ICD-10-CM | POA: Diagnosis not present

## 2017-07-15 DIAGNOSIS — N184 Chronic kidney disease, stage 4 (severe): Secondary | ICD-10-CM | POA: Diagnosis not present

## 2017-07-16 ENCOUNTER — Encounter: Payer: Self-pay | Admitting: Gastroenterology

## 2017-07-21 DIAGNOSIS — I10 Essential (primary) hypertension: Secondary | ICD-10-CM

## 2017-07-21 DIAGNOSIS — N289 Disorder of kidney and ureter, unspecified: Secondary | ICD-10-CM

## 2017-07-21 DIAGNOSIS — N362 Urethral caruncle: Secondary | ICD-10-CM | POA: Diagnosis not present

## 2017-07-21 HISTORY — DX: Disorder of kidney and ureter, unspecified: N28.9

## 2017-07-21 HISTORY — DX: Essential (primary) hypertension: I10

## 2017-07-27 DIAGNOSIS — N184 Chronic kidney disease, stage 4 (severe): Secondary | ICD-10-CM | POA: Diagnosis not present

## 2017-07-29 DIAGNOSIS — D631 Anemia in chronic kidney disease: Secondary | ICD-10-CM | POA: Diagnosis not present

## 2017-07-29 DIAGNOSIS — N184 Chronic kidney disease, stage 4 (severe): Secondary | ICD-10-CM | POA: Diagnosis not present

## 2017-08-11 ENCOUNTER — Other Ambulatory Visit: Payer: Self-pay

## 2017-08-11 MED ORDER — ISOSORBIDE MONONITRATE ER 30 MG PO TB24: 30.0000 mg | ORAL_TABLET | Freq: Every day | ORAL | 3 refills | Status: DC

## 2017-08-12 DIAGNOSIS — N184 Chronic kidney disease, stage 4 (severe): Secondary | ICD-10-CM | POA: Diagnosis not present

## 2017-08-12 DIAGNOSIS — D631 Anemia in chronic kidney disease: Secondary | ICD-10-CM | POA: Diagnosis not present

## 2017-08-19 DIAGNOSIS — E663 Overweight: Secondary | ICD-10-CM | POA: Diagnosis not present

## 2017-08-19 DIAGNOSIS — Z6829 Body mass index (BMI) 29.0-29.9, adult: Secondary | ICD-10-CM | POA: Diagnosis not present

## 2017-08-19 DIAGNOSIS — Z8639 Personal history of other endocrine, nutritional and metabolic disease: Secondary | ICD-10-CM | POA: Diagnosis not present

## 2017-08-24 DIAGNOSIS — N184 Chronic kidney disease, stage 4 (severe): Secondary | ICD-10-CM | POA: Diagnosis not present

## 2017-08-24 DIAGNOSIS — D631 Anemia in chronic kidney disease: Secondary | ICD-10-CM | POA: Diagnosis not present

## 2017-08-25 ENCOUNTER — Telehealth: Payer: Self-pay | Admitting: Cardiology

## 2017-08-25 NOTE — Telephone Encounter (Signed)
Has been in afib for 4 days and it's pretty debilitating

## 2017-08-25 NOTE — Telephone Encounter (Signed)
Patient states that she had went into afib last week for 4 days and eventually come out of it. Last night she layed down for bed and went back into a-fib with a rate of 125-135. Patient feels that her heart rate has come down but is still irregular. States that she is dizzy with ambulation and feels "weak." Per your last note you stated that she has not experienced sustained rhythms. Please advise next steps? EKG/monitor?

## 2017-08-26 ENCOUNTER — Other Ambulatory Visit: Payer: Self-pay

## 2017-08-26 ENCOUNTER — Encounter: Payer: Self-pay | Admitting: Gastroenterology

## 2017-08-26 DIAGNOSIS — I480 Paroxysmal atrial fibrillation: Secondary | ICD-10-CM

## 2017-08-26 NOTE — Telephone Encounter (Signed)
Patient states that she has come out of afib now but is agreeable to have a minitor placed. Order will be placed. Message sent to [facility] for scheduling.

## 2017-08-26 NOTE — Telephone Encounter (Signed)
Get ecg today and she needs to wear event recorder to see how much of a.fib she has

## 2017-08-26 NOTE — Telephone Encounter (Signed)
Pt is scheduled for 30 day event monitor on 7-31 at 3:30

## 2017-08-27 ENCOUNTER — Ambulatory Visit (INDEPENDENT_AMBULATORY_CARE_PROVIDER_SITE_OTHER): Payer: Medicare Other | Admitting: Gastroenterology

## 2017-08-27 ENCOUNTER — Encounter

## 2017-08-27 ENCOUNTER — Encounter: Payer: Self-pay | Admitting: Gastroenterology

## 2017-08-27 VITALS — BP 126/62 | HR 63 | Ht 63.0 in | Wt 165.4 lb

## 2017-08-27 DIAGNOSIS — D631 Anemia in chronic kidney disease: Secondary | ICD-10-CM | POA: Diagnosis not present

## 2017-08-27 DIAGNOSIS — D649 Anemia, unspecified: Secondary | ICD-10-CM | POA: Diagnosis not present

## 2017-08-27 DIAGNOSIS — N189 Chronic kidney disease, unspecified: Secondary | ICD-10-CM

## 2017-08-27 DIAGNOSIS — N184 Chronic kidney disease, stage 4 (severe): Secondary | ICD-10-CM | POA: Diagnosis not present

## 2017-08-27 DIAGNOSIS — K922 Gastrointestinal hemorrhage, unspecified: Secondary | ICD-10-CM | POA: Diagnosis not present

## 2017-08-27 NOTE — Progress Notes (Signed)
Chief Complaint: heme positive stools  Referring Provider:  Street, Sharon Mt, *      ASSESSMENT AND PLAN;   #1. Obscure GI Bleeding on plavix. Hb 10.7 (08/24/2017), was 7.5 (06/21/2017), s/p iron infusion and blood transfusions in the past. S/P EGD 08/12/2016 by Dr. Dolphus Jenny showing small hiatal hernia, mild gastritis.  S/P colonoscopy 08/12/2016 (Dr Dolphus Jenny) showing moderate pancolonic diverticulosis, small ascending colonic tubular adenoma, ischemic colitis and mod internal hemorrhoids.  Colonoscopy 11/21/2014 showing moderate colonic diverticulosis. #2.  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  H/O Ischemic colitis July 2018.  Plan: -Proceed with CT scan of the abdomen and pelvis.  Unfortunately, she cannot be given IV contrast due to CRI.  Will only give p.o. Contrast. -Recommend capsule endoscopy once she has completed 30-day heart monitor test.  If she has any proximal small bowel AVMs within the reach of enteroscope, then would recommend enteroscopy.  Otherwise, she continues to have GI bleed/anemia requiring blood transfusions, would need DBE.   HPI:    Amanda [name] is a 77 y.o. female  Heme positive stools S/p Watchman's procedure (Dr Denman George) 11/2016 for A Fib Still on Plavix. Awaiting hip replacement  She is scheduled to have a 30-day heart monitor for paroxysmal atrial fibrillation by Dr. Raliegh Ip starting next week. Has dark stools since she is also taking iron supplements. Denies having any hematochezia. Has been trying to lose weight. Denies having any nausea, vomiting, heartburn, regurgitation, diarrhea or constipation. No abdominal pain.  She also has chronic renal insufficiency with baseline creatinine of 1.11 July 2017.   Past Medical History:  Diagnosis Date  . Acute ischemic stroke ([place])   . Acute metabolic encephalopathy   . Acute on chronic systolic (congestive) heart failure ([place])     . Atrial fibrillation ([place])   . Chronic anemia   . Chronic kidney disease (CKD), stage III (moderate) (HCC)   . Diabetes mellitus without complication ([place])   . Diverticulosis   . Gastroesophageal reflux   . Hyperlipidemia   . Hypertensive heart disease with heart failure ([place])   . Myocardial infarction ([place])   . OSA on CPAP   . Renal failure, chronic, stage 3 (moderate) (HCC)   . Systolic heart failure ([place])   . Thyroid disease     Past Surgical History:  Procedure Laterality Date  . CARDIAC CATHETERIZATION    . COLONOSCOPY  11/21/2014   Moderate predominantly sigmoid diverticulosis. Otherwise noraml collonscopy to TI.   Marland Kitchen CORONARY ANGIOPLASTY WITH STENT PLACEMENT  08/2016  . EXCISIONAL HEMORRHOIDECTOMY    . LEFT ATRIAL APPENDAGE OCCLUSION  11/2016   in [place]  . NOSE SURGERY      Family History  Problem Relation Age of Onset  . Breast cancer Mother   . Hypertension Father   . Prostate cancer Father   . Stroke Father   . Diabetes Sister   . Heart attack Sister   . Heart attack Brother   . Heart attack Maternal Uncle     Social History   Tobacco Use  . Smoking status: Former Research scientist (life sciences)  . Smokeless tobacco: Never Used  Substance Use Topics  . Alcohol use: No  . Drug use: No    Current Outpatient Medications  Medication Sig Dispense Refill  . albuterol (PROVENTIL HFA;VENTOLIN HFA) 108 (90 Base) MCG/ACT inhaler Inhale 2 puffs into the lungs every 6 (six) hours as needed for wheezing or shortness of  breath.    Marland Kitchen aspirin EC 81 MG tablet Take 81 mg by mouth daily.    . cholecalciferol (VITAMIN D) 1000 units tablet Take 2,000 Units by mouth daily.    . clopidogrel (PLAVIX) 75 MG tablet Take 75 mg by mouth daily.    . Coenzyme Q10 100 MG TABS Take 100 mg by mouth daily at 12 noon.     . colchicine 0.6 MG tablet Take 0.6-1 mg by mouth See admin instructions. 1.2mg  at onset of gout attack, then 0.6mg  an hour later. Next day 0.6mg  twice a day, then 0.6mg  daily until  gout pain subsides.    Marland Kitchen ezetimibe (ZETIA) 10 MG tablet Take 1 tablet (10 mg total) by mouth daily. 30 tablet 6  . FeAsp-FeFum -Suc-C-Thre-B12-FA (MULTIGEN PLUS PO) Take 1 tablet by mouth daily.    . febuxostat (ULORIC) 40 MG tablet Take 40 mg by mouth daily.    . folic acid (FOLVITE) 161 MCG tablet Take 800 mcg by mouth daily at 12 noon.     . furosemide (LASIX) 20 MG tablet Take 60 mg by mouth daily with breakfast.    . furosemide (LASIX) 40 MG tablet Take 40 mg by mouth daily after lunch.     . Glucosamine-Chondroitin (OSTEO BI-FLEX REGULAR STRENGTH PO) Take 1 capsule by mouth daily at 12 noon.    . hydrALAZINE (APRESOLINE) 25 MG tablet Take 25 mg by mouth 3 (three) times daily.    . isosorbide mononitrate (IMDUR) 30 MG 24 hr tablet Take 1 tablet (30 mg total) by mouth daily. 30 tablet 3  . Levothyroxine Sodium 50 MCG CAPS Take 50 mcg by mouth daily before breakfast.     . multivitamin-lutein (OCUVITE-LUTEIN) CAPS capsule Take 1 capsule by mouth daily at 12 noon.    . nitroGLYCERIN (NITROSTAT) 0.4 MG SL tablet Place 0.4 mg under the tongue every 5 (five) minutes as needed for chest pain.    Marland Kitchen omega-3 acid ethyl esters (LOVAZA) 1 g capsule Take 1 capsule (1 g total) by mouth daily at 12 noon. 30 capsule 6  . pantoprazole (PROTONIX) 40 MG tablet Take 40 mg by mouth daily.    . pravastatin (PRAVACHOL) 40 MG tablet Take 40 mg by mouth daily.    . Probiotic Product (PRO-BIOTIC BLEND PO) Take 1 capsule by mouth daily.    . ranolazine (RANEXA) 1000 MG SR tablet Take 1 tablet (1,000 mg total) by mouth 2 (two) times daily. 60 tablet 6  . traMADol (ULTRAM) 50 MG tablet Take 50 mg by mouth every 12 (twelve) hours as needed for moderate pain.     Marland Kitchen trimethoprim (TRIMPEX) 100 MG tablet Take 1 tablet by mouth daily.  0  . vitamin B-12 (CYANOCOBALAMIN) 1000 MCG tablet Take 1,000 mcg by mouth daily at 12 noon.    Marland Kitchen zolpidem (AMBIEN) 10 MG tablet Take 10 mg by mouth at bedtime as needed for sleep.     No  current facility-administered medications for this visit.     Allergies  Allergen Reactions  . Ciprofloxacin Other (See Comments)    Achilles tendon pain  . Statins Other (See Comments)    Joint pain    Review of Systems:  Constitutional: Denies fever, chills, diaphoresis, appetite change and has fatigue.  HEENT: Denies photophobia, eye pain, redness, hearing loss, ear pain, congestion, sore throat, rhinorrhea, sneezing, mouth sores, neck pain, neck stiffness and tinnitus.   Respiratory: Denies SOB, DOE, cough, chest tightness,  and wheezing.   Cardiovascular:  Denies chest pain, palpitations and leg swelling.  Genitourinary: Denies dysuria, urgency, frequency, hematuria, flank pain and difficulty urinating.  Musculoskeletal: Has myalgias, back pain, joint swelling, arthralgias and has gait problem.  Skin: No rash.  Neurological: Denies dizziness, seizures, syncope, weakness, light-headedness, numbness and headaches.  Hematological: Denies adenopathy. Easy bruising, personal or family bleeding history  Psychiatric/Behavioral: No anxiety or depression. Has sleeping problems.     Physical Exam:    BP 126/62   Pulse 63   Ht 5\' 3"  (1.6 m)   Wt 165 lb 6 oz (75 kg)   BMI 29.29 kg/m  Filed Weights   08/27/17 1114  Weight: 165 lb 6 oz (75 kg)   Constitutional:  Well-developed, in no acute distress. Psychiatric: Normal mood and affect. Behavior is normal. HEENT: Pupils normal.  Conjunctivae are normal. No scleral icterus. Neck supple.  Cardiovascular: Normal rate, regular rhythm. No edema Pulmonary/chest: Effort normal and breath sounds normal. No wheezing, rales or rhonchi. Abdominal: Soft, nondistended. Nontender. Bowel sounds active throughout. There are no masses palpable. No hepatomegaly. Rectal:  defered Neurological: Alert and oriented to person place and time. Skin: Skin is warm and dry. No rashes noted.  Data Reviewed: I have personally reviewed following labs and  imaging studies  CBC: CBC Latest Ref Rng & Units 06/24/2017 02/24/2017 01/15/2017  WBC 3.4 - 10.8 x10E3/uL 5.8 7.5 6.0  Hemoglobin 11.1 - 15.9 g/dL 7.3(L) 8.7(L) 8.9(L)  Hematocrit 34.0 - 46.6 % 23.4(L) 26.9(L) 28.7(L)  Platelets 150 - 450 x10E3/uL 451(H) 395(H) 273    CMP: CMP Latest Ref Rng & Units 01/15/2017 10/12/2016  Glucose 65 - 99 mg/dL 124(H) 199(H)  BUN 6 - 20 mg/dL 40(H) 53(H)  Creatinine 0.44 - 1.00 mg/dL 1.92(H) 2.69(H)  Sodium 135 - 145 mmol/L 139 136  Potassium 3.5 - 5.1 mmol/L 4.3 4.8  Chloride 101 - 111 mmol/L 106 88(L)  CO2 22 - 32 mmol/L 23 30(H)  Calcium 8.9 - 10.3 mg/dL 9.9 11.0(H)   Extensive records were reviewed.   Carmell Austria, MD 08/27/2017, 11:27 AM  Cc: Street, Sharon Mt, *

## 2017-08-27 NOTE — Patient Instructions (Signed)
If you are age 77 or older, your body mass index should be between 23-30. Your Body mass index is 29.29 kg/m. If this is out of the aforementioned range listed, please consider follow up with your Primary Care Provider.  If you are age 75 or younger, your body mass index should be between 19-25. Your Body mass index is 29.29 kg/m. If this is out of the aformentioned range listed, please consider follow up with your Primary Care Provider.   You have been scheduled for a CT scan of the abdomen and pelvis at [place] are scheduled on 09/03/17 at [place] should arrive 15 minutes prior to your appointment time for registration. Please follow the written instructions below on the day of your exam:  WARNING: IF YOU ARE ALLERGIC TO IODINE/X-RAY DYE, PLEASE NOTIFY RADIOLOGY IMMEDIATELY AT [phone]! YOU WILL BE GIVEN A 13 HOUR PREMEDICATION PREP.  1) Do not eat or drink anything after 6am (4 hours prior to your test) 2) You have been given 2 bottles of oral contrast to drink. The solution may taste better if refrigerated, but do NOT add ice or any other liquid to this solution. Shake well before drinking.    Drink 1 bottle of contrast @ 8am (2 hours prior to your exam)  Drink 1 bottle of contrast @ 9am (1 hour prior to your exam)  You may take any medications as prescribed with a small amount of water except for the following: Metformin, Glucophage, Glucovance, Avandamet, Riomet, Fortamet, Actoplus Met, Janumet, Glumetza or Metaglip. The above medications must be held the day of the exam AND 48 hours after the exam.  The purpose of you drinking the oral contrast is to aid in the visualization of your intestinal tract. The contrast solution may cause some diarrhea. Before your exam is started, you will be given a small amount of fluid to drink. Depending on your individual set of symptoms, you may also receive an intravenous injection of x-ray contrast/dye. Plan on being at [facility] for 30 minutes or longer, depending on the type of exam you are having performed.  This test typically takes 30-45 minutes to complete.  If you have any questions regarding your exam or if you need to reschedule, you may call the CT department at [phone] between the hours of 8:00 am and 5:00 pm, Monday-Friday.  ________________________________________________________________________  After you have completed your 30 day heart monitor please call our office at [phone] so you can be scheduled for a Capsule Endoscopy.  Thank you,  Dr. Jackquline Denmark

## 2017-09-01 ENCOUNTER — Ambulatory Visit (INDEPENDENT_AMBULATORY_CARE_PROVIDER_SITE_OTHER): Payer: Medicare Other

## 2017-09-01 DIAGNOSIS — I480 Paroxysmal atrial fibrillation: Secondary | ICD-10-CM

## 2017-09-03 ENCOUNTER — Ambulatory Visit (HOSPITAL_BASED_OUTPATIENT_CLINIC_OR_DEPARTMENT_OTHER)
Admission: RE | Admit: 2017-09-03 | Discharge: 2017-09-03 | Disposition: A | Discharge status: Home / Self Care | Payer: Medicare Other | Source: Ambulatory Visit | Attending: Gastroenterology | Admitting: Gastroenterology

## 2017-09-03 DIAGNOSIS — K922 Gastrointestinal hemorrhage, unspecified: Secondary | ICD-10-CM | POA: Diagnosis present

## 2017-09-03 DIAGNOSIS — N189 Chronic kidney disease, unspecified: Secondary | ICD-10-CM | POA: Insufficient documentation

## 2017-09-03 DIAGNOSIS — R933 Abnormal findings on diagnostic imaging of other parts of digestive tract: Secondary | ICD-10-CM | POA: Insufficient documentation

## 2017-09-03 DIAGNOSIS — D649 Anemia, unspecified: Secondary | ICD-10-CM | POA: Insufficient documentation

## 2017-09-03 DIAGNOSIS — K5730 Diverticulosis of large intestine without perforation or abscess without bleeding: Secondary | ICD-10-CM | POA: Insufficient documentation

## 2017-09-07 ENCOUNTER — Other Ambulatory Visit: Payer: Self-pay | Admitting: Cardiology

## 2017-09-07 MED ORDER — EZETIMIBE 10 MG PO TABS: 10.0000 mg | ORAL_TABLET | Freq: Every day | ORAL | 1 refills | Status: DC

## 2017-09-09 DIAGNOSIS — D631 Anemia in chronic kidney disease: Secondary | ICD-10-CM | POA: Diagnosis not present

## 2017-09-09 DIAGNOSIS — N184 Chronic kidney disease, stage 4 (severe): Secondary | ICD-10-CM | POA: Diagnosis not present

## 2017-09-13 DIAGNOSIS — I440 Atrioventricular block, first degree: Secondary | ICD-10-CM | POA: Diagnosis not present

## 2017-09-13 DIAGNOSIS — I4891 Unspecified atrial fibrillation: Secondary | ICD-10-CM | POA: Diagnosis not present

## 2017-09-13 DIAGNOSIS — R531 Weakness: Secondary | ICD-10-CM | POA: Diagnosis not present

## 2017-09-13 DIAGNOSIS — R110 Nausea: Secondary | ICD-10-CM | POA: Diagnosis not present

## 2017-09-13 DIAGNOSIS — R231 Pallor: Secondary | ICD-10-CM | POA: Diagnosis not present

## 2017-09-13 DIAGNOSIS — R55 Syncope and collapse: Secondary | ICD-10-CM | POA: Diagnosis not present

## 2017-09-14 ENCOUNTER — Telehealth: Payer: Self-pay | Admitting: Cardiology

## 2017-09-14 NOTE — Telephone Encounter (Signed)
Patient reports feeling light headed last night and husband reports patient passing out. Husband called 29 patient seen at [facility] emergency department. [facility] hospital advised patient to follow up with Dr. Agustin Cree. Will contact monitor representative to see if anything arrhythmias were noted and will follow up per Dr. Agustin Cree.

## 2017-09-14 NOTE — Telephone Encounter (Signed)
Amanda [name] [phone]  [name] called to say that she went to [facility] yesterday and they advised her to let Dr Raliegh Ip know she was experiencing light headedness, her husband said she passed out and she was also nauseas. She did state that she was continuing to wear her monitor.

## 2017-09-14 NOTE — Telephone Encounter (Signed)
Per Dr. Agustin Cree patient advised to follow up as previously discussed. Patient verbally understands.

## 2017-09-21 DIAGNOSIS — N3020 Other chronic cystitis without hematuria: Secondary | ICD-10-CM | POA: Diagnosis not present

## 2017-09-21 DIAGNOSIS — R351 Nocturia: Secondary | ICD-10-CM | POA: Diagnosis not present

## 2017-09-23 DIAGNOSIS — I130 Hypertensive heart and chronic kidney disease with heart failure and stage 1 through stage 4 chronic kidney disease, or unspecified chronic kidney disease: Secondary | ICD-10-CM | POA: Diagnosis not present

## 2017-09-23 DIAGNOSIS — N184 Chronic kidney disease, stage 4 (severe): Secondary | ICD-10-CM | POA: Diagnosis not present

## 2017-09-23 DIAGNOSIS — R42 Dizziness and giddiness: Secondary | ICD-10-CM | POA: Insufficient documentation

## 2017-09-23 DIAGNOSIS — R55 Syncope and collapse: Secondary | ICD-10-CM | POA: Insufficient documentation

## 2017-09-23 DIAGNOSIS — I5020 Unspecified systolic (congestive) heart failure: Secondary | ICD-10-CM | POA: Diagnosis not present

## 2017-09-23 DIAGNOSIS — D631 Anemia in chronic kidney disease: Secondary | ICD-10-CM | POA: Diagnosis not present

## 2017-09-23 HISTORY — DX: Syncope and collapse: R55

## 2017-09-23 HISTORY — DX: Dizziness and giddiness: R42

## 2017-10-21 ENCOUNTER — Telehealth: Payer: Self-pay | Admitting: Gastroenterology

## 2017-10-21 ENCOUNTER — Other Ambulatory Visit: Payer: Self-pay

## 2017-10-21 DIAGNOSIS — Z8639 Personal history of other endocrine, nutritional and metabolic disease: Secondary | ICD-10-CM | POA: Diagnosis not present

## 2017-10-21 DIAGNOSIS — K921 Melena: Secondary | ICD-10-CM

## 2017-10-21 DIAGNOSIS — Z6829 Body mass index (BMI) 29.0-29.9, adult: Secondary | ICD-10-CM | POA: Diagnosis not present

## 2017-10-21 DIAGNOSIS — E663 Overweight: Secondary | ICD-10-CM | POA: Diagnosis not present

## 2017-10-21 NOTE — Telephone Encounter (Signed)
Pt calling to schedule capsule following cardiac monitor that she had to wear for 30days. Pt scheduled for capsule 10/28/17@8 :30am. Pt sent prep instructions via mychart. Ambulatory referral entered in epic.

## 2017-10-28 ENCOUNTER — Encounter: Payer: Self-pay | Admitting: Gastroenterology

## 2017-10-28 ENCOUNTER — Ambulatory Visit: Payer: Medicare Other | Admitting: Gastroenterology

## 2017-10-28 DIAGNOSIS — K921 Melena: Secondary | ICD-10-CM

## 2017-10-28 DIAGNOSIS — K317 Polyp of stomach and duodenum: Secondary | ICD-10-CM

## 2017-10-28 DIAGNOSIS — K922 Gastrointestinal hemorrhage, unspecified: Secondary | ICD-10-CM | POA: Diagnosis not present

## 2017-10-28 NOTE — Progress Notes (Signed)
Pt completed prep for capsule endoscopy, she swallowed the capsule without difficulty. Pt aware of how to collect capsule and return it.   Capsule Lot number [id number]

## 2017-11-01 ENCOUNTER — Telehealth: Payer: Self-pay | Admitting: Gastroenterology

## 2017-11-01 NOTE — Telephone Encounter (Signed)
Pt called and wanted to know if the specimen container needed to have a label on it. Discussed with her that the label on the  Main package is printed when she is checked in and is pt specific and will ID the capsule that way.

## 2017-11-03 ENCOUNTER — Telehealth: Payer: Self-pay | Admitting: Cardiology

## 2017-11-03 ENCOUNTER — Other Ambulatory Visit: Payer: Self-pay | Admitting: Emergency Medicine

## 2017-11-03 ENCOUNTER — Other Ambulatory Visit: Payer: Self-pay

## 2017-11-03 MED ORDER — RANOLAZINE ER 1000 MG PO TB12: 1000.0000 mg | ORAL_TABLET | Freq: Two times a day (BID) | ORAL | 1 refills | Status: DC

## 2017-11-03 NOTE — Telephone Encounter (Signed)
Wants to change her Ranexa that was called in yesterday to 90 days instead of 30

## 2017-11-03 NOTE — Telephone Encounter (Signed)
Medication refilled

## 2017-11-04 ENCOUNTER — Ambulatory Visit (INDEPENDENT_AMBULATORY_CARE_PROVIDER_SITE_OTHER): Payer: Medicare Other | Admitting: Cardiology

## 2017-11-04 ENCOUNTER — Encounter: Payer: Self-pay | Admitting: Cardiology

## 2017-11-04 VITALS — BP 122/50 | HR 59 | Wt 170.6 lb

## 2017-11-04 DIAGNOSIS — Z95818 Presence of other cardiac implants and grafts: Secondary | ICD-10-CM | POA: Diagnosis not present

## 2017-11-04 DIAGNOSIS — R55 Syncope and collapse: Secondary | ICD-10-CM | POA: Insufficient documentation

## 2017-11-04 DIAGNOSIS — I2510 Atherosclerotic heart disease of native coronary artery without angina pectoris: Secondary | ICD-10-CM

## 2017-11-04 HISTORY — DX: Syncope and collapse: R55

## 2017-11-04 NOTE — Progress Notes (Signed)
Cardiology Office Note:    Date:  11/04/2017   ID:  Amanda [name], DOB [birth year], MRN [id number]  PCP:  Street, Sharon Mt, MD  Cardiologist:  Jenne Campus, MD    Referring MD: Street, Sharon Mt, *   Chief Complaint  Patient presents with  . Follow-up  Doing well but have problem with my knee  History of Present Illness:    Amanda [name] is a 77 y.o. female with multiple medical problems that include paroxysmal atrial fibrillation.  She comes for regular follow-up doing well had a watchman device implanted.  Because of GI bleeding.  Complaining of tired and fatigue also had episodes of what appears to be near syncope.  Syncope.  Apparently she was sleeping well Wanted to go to the restroom sit on the edge of the back to try to get up and ended up falling down may be orthostatic however cannot exclude possibility of arrhythmia she did wear event recorder which did not show anything exciting.  She did have some extrasystole but nothing more than that.  I think with the best approach for her will be to repeat echocardiogram actually she does not have any significant cardiomyopathy or vascular pathology and if that is negative then I will propose to implant loop recorder in her.  Past Medical History:  Diagnosis Date  . Acute ischemic stroke ([place])   . Acute metabolic encephalopathy   . Acute on chronic systolic (congestive) heart failure ([place])   . Atrial fibrillation ([place])   . Chronic anemia   . Chronic kidney disease (CKD), stage III (moderate) (HCC)   . Diabetes mellitus without complication ([place])   . Diverticulosis   . Gastroesophageal reflux   . Hyperlipidemia   . Hypertensive heart disease with heart failure ([place])   . Myocardial infarction ([place])   . OSA on CPAP   . Renal failure, chronic, stage 3 (moderate) (HCC)   . Systolic heart failure ([place])   . Thyroid disease     Past Surgical History:  Procedure Laterality Date  . CARDIAC CATHETERIZATION    . COLONOSCOPY   11/21/2014   Moderate predominantly sigmoid diverticulosis. Otherwise noraml collonscopy to TI.   Marland Kitchen CORONARY ANGIOPLASTY WITH STENT PLACEMENT  08/2016  . EXCISIONAL HEMORRHOIDECTOMY    . LEFT ATRIAL APPENDAGE OCCLUSION  11/2016   in [place]  . NOSE SURGERY      Current Medications: Current Meds  Medication Sig  . albuterol (PROVENTIL HFA;VENTOLIN HFA) 108 (90 Base) MCG/ACT inhaler Inhale 2 puffs into the lungs every 6 (six) hours as needed for wheezing or shortness of breath.  Marland Kitchen aspirin EC 81 MG tablet Take 81 mg by mouth daily.  . cholecalciferol (VITAMIN D) 1000 units tablet Take 2,000 Units by mouth daily.  . clopidogrel (PLAVIX) 75 MG tablet Take 75 mg by mouth daily.  . Coenzyme Q10 100 MG TABS Take 100 mg by mouth daily at 12 noon.   . colchicine 0.6 MG tablet Take 0.6-1 mg by mouth See admin instructions. 1.2mg  at onset of gout attack, then 0.6mg  an hour later. Next day 0.6mg  twice a day, then 0.6mg  daily until gout pain subsides.  Marland Kitchen ezetimibe (ZETIA) 10 MG tablet Take 1 tablet (10 mg total) by mouth daily. Please schedule office visit  . FeAsp-FeFum -Suc-C-Thre-B12-FA (MULTIGEN PLUS PO) Take 1 tablet by mouth daily.  . febuxostat (ULORIC) 40 MG tablet Take 40 mg by mouth daily.  . folic acid (FOLVITE) 885 MCG tablet Take 800 mcg by mouth  daily at 12 noon.   . furosemide (LASIX) 20 MG tablet Take 60 mg by mouth daily with breakfast.  . furosemide (LASIX) 40 MG tablet Take 40 mg by mouth daily after lunch.   . Glucosamine-Chondroitin (OSTEO BI-FLEX REGULAR STRENGTH PO) Take 1 capsule by mouth daily at 12 noon.  . hydrALAZINE (APRESOLINE) 10 MG tablet Take 10 mg by mouth 2 (two) times daily.   . isosorbide mononitrate (IMDUR) 30 MG 24 hr tablet Take 1 tablet (30 mg total) by mouth daily.  . Levothyroxine Sodium 50 MCG CAPS Take 50 mcg by mouth daily before breakfast.   . multivitamin-lutein (OCUVITE-LUTEIN) CAPS capsule Take 1 capsule by mouth daily at 12 noon.  . nitroGLYCERIN  (NITROSTAT) 0.4 MG SL tablet Place 0.4 mg under the tongue every 5 (five) minutes as needed for chest pain.  Marland Kitchen omega-3 acid ethyl esters (LOVAZA) 1 g capsule Take 1 capsule (1 g total) by mouth daily at 12 noon.  . pantoprazole (PROTONIX) 40 MG tablet Take 40 mg by mouth daily.  . pravastatin (PRAVACHOL) 40 MG tablet Take 40 mg by mouth daily.  . Probiotic Product (PRO-BIOTIC BLEND PO) Take 1 capsule by mouth daily.  . ranolazine (RANEXA) 1000 MG SR tablet Take 1 tablet (1,000 mg total) by mouth 2 (two) times daily.  . traMADol (ULTRAM) 50 MG tablet Take 50 mg by mouth every 12 (twelve) hours as needed for moderate pain.   . vitamin B-12 (CYANOCOBALAMIN) 1000 MCG tablet Take 1,000 mcg by mouth daily at 12 noon.  Marland Kitchen zolpidem (AMBIEN) 10 MG tablet Take 10 mg by mouth at bedtime as needed for sleep.     Allergies:   Ciprofloxacin and Statins   Social History   Socioeconomic History  . Marital status: Married    Spouse name: Not on file  . Number of children: Not on file  . Years of education: Not on file  . Highest education level: Not on file  Occupational History  . Not on file  Social Needs  . Financial resource strain: Not on file  . Food insecurity:    Worry: Not on file    Inability: Not on file  . Transportation needs:    Medical: Not on file    Non-medical: Not on file  Tobacco Use  . Smoking status: Former Research scientist (life sciences)  . Smokeless tobacco: Never Used  Substance and Sexual Activity  . Alcohol use: No  . Drug use: No  . Sexual activity: Not on file  Lifestyle  . Physical activity:    Days per week: Not on file    Minutes per session: Not on file  . Stress: Not on file  Relationships  . Social connections:    Talks on phone: Not on file    Gets together: Not on file    Attends religious service: Not on file    Active member of club or organization: Not on file    Attends meetings of clubs or organizations: Not on file    Relationship status: Not on file  Other Topics  Concern  . Not on file  Social History Narrative  . Not on file     Family History: The patient's family history includes Breast cancer in her mother; Diabetes in her sister; Heart attack in her brother, maternal uncle, and sister; Hypertension in her father; Prostate cancer in her father; Stroke in her father. ROS:   Please see the history of present illness.    All 14 point  review of systems negative except as described per history of present illness  EKGs/Labs/Other Studies Reviewed:      Recent Labs: 01/15/2017: BUN 40; Creatinine, Ser 1.92; Potassium 4.3; Sodium 139 06/24/2017: Hemoglobin 7.3; Platelets 451  Recent Lipid Panel No results found for: CHOL, TRIG, HDL, CHOLHDL, VLDL, LDLCALC, LDLDIRECT  Physical Exam:    VS:  BP (!) 122/50   Pulse (!) 59   Wt 170 lb 9.6 oz (77.4 kg)   SpO2 96%   BMI 30.22 kg/m     Wt Readings from Last 3 Encounters:  11/04/17 170 lb 9.6 oz (77.4 kg)  08/27/17 165 lb 6 oz (75 kg)  06/24/17 167 lb 12.8 oz (76.1 kg)     GEN:  Well nourished, well developed in no acute distress HEENT: Normal NECK: No JVD; No carotid bruits LYMPHATICS: No lymphadenopathy CARDIAC: RRR, systolic murmur 8-8/8 , no rubs, no gallops RESPIRATORY:  Clear to auscultation without rales, wheezing or rhonchi  ABDOMEN: Soft, non-tender, non-distended MUSCULOSKELETAL:  No edema; No deformity  SKIN: Warm and dry LOWER EXTREMITIES: no swelling NEUROLOGIC:  Alert and oriented x 3 PSYCHIATRIC:  Normal affect   ASSESSMENT:    1. Presence of Watchman left atrial appendage closure device   2. Coronary artery disease involving native coronary artery of native heart without angina pectoris   3. Syncope, unspecified syncope type    PLAN:    In order of problems listed above:  1. Watchman device present. 2. Coronary artery disease stable denies have any issues. 3. Syncope we will get echocardiogram and that is negative we will talk about implantable loop  recorder. 4. She would like to have hip surgery done however now with this recurrent syncope I am concerned.  Again loop recorder will be implanted most likely.   Medication Adjustments/Labs and Tests Ordered: Current medicines are reviewed at length with the patient today.  Concerns regarding medicines are outlined above.  No orders of the defined types were placed in this encounter.  Medication changes: No orders of the defined types were placed in this encounter.   Signed, Park Liter, MD, [facility] 11/04/2017 1:57 PM    [place]

## 2017-11-04 NOTE — Patient Instructions (Signed)
Medication Instructions:  Your physician recommends that you continue on your current medications as directed. Please refer to the Current Medication list given to you today.   Labwork: None.  Testing/Procedures: Your physician has requested that you have an echocardiogram. Echocardiography is a painless test that uses sound waves to create images of your heart. It provides your doctor with information about the size and shape of your heart and how well your heart's chambers and valves are working. This procedure takes approximately one hour. There are no restrictions for this procedure.    Follow-up: Your physician recommends that you schedule a follow-up appointment in: 1 month.    Any Other Special Instructions Will Be Listed Below (If Applicable).     If you need a refill on your cardiac medications before your next appointment, please call your pharmacy.  Echocardiogram An echocardiogram, or echocardiography, uses sound waves (ultrasound) to produce an image of your heart. The echocardiogram is simple, painless, obtained within a short period of time, and offers valuable information to your health care provider. The images from an echocardiogram can provide information such as:  Evidence of coronary artery disease (CAD).  Heart size.  Heart muscle function.  Heart valve function.  Aneurysm detection.  Evidence of a past heart attack.  Fluid buildup around the heart.  Heart muscle thickening.  Assess heart valve function.  Tell a health care provider about:  Any allergies you have.  All medicines you are taking, including vitamins, herbs, eye drops, creams, and over-the-counter medicines.  Any problems you or family members have had with anesthetic medicines.  Any blood disorders you have.  Any surgeries you have had.  Any medical conditions you have.  Whether you are pregnant or may be pregnant. What happens before the procedure? No special  preparation is needed. Eat and drink normally. What happens during the procedure?  In order to produce an image of your heart, gel will be applied to your chest and a wand-like tool (transducer) will be moved over your chest. The gel will help transmit the sound waves from the transducer. The sound waves will harmlessly bounce off your heart to allow the heart images to be captured in real-time motion. These images will then be recorded.  You may need an IV to receive a medicine that improves the quality of the pictures. What happens after the procedure? You may return to your normal schedule including diet, activities, and medicines, unless your health care provider tells you otherwise. This information is not intended to replace advice given to you by your health care provider. Make sure you discuss any questions you have with your health care provider. Document Released: 01/17/2000 Document Revised: 09/07/2015 Document Reviewed: 09/26/2012 Elsevier Interactive Patient Education  2017 [employer].

## 2017-11-08 ENCOUNTER — Telehealth: Payer: Self-pay | Admitting: Cardiology

## 2017-11-08 MED ORDER — EZETIMIBE 10 MG PO TABS: 10.0000 mg | ORAL_TABLET | Freq: Every day | ORAL | 1 refills | Status: DC

## 2017-11-08 NOTE — Telephone Encounter (Signed)
Medication refilled

## 2017-11-08 NOTE — Addendum Note (Signed)
Addended by: Ashok Norris on: 11/08/2017 04:12 PM   Modules accepted: Orders

## 2017-11-08 NOTE — Telephone Encounter (Signed)
Please call [facility] about cholesterol medicine

## 2017-11-09 ENCOUNTER — Other Ambulatory Visit: Payer: Self-pay

## 2017-11-09 MED ORDER — EZETIMIBE 10 MG PO TABS: 10.0000 mg | ORAL_TABLET | Freq: Every day | ORAL | 2 refills | Status: DC

## 2017-11-12 DIAGNOSIS — R55 Syncope and collapse: Secondary | ICD-10-CM | POA: Diagnosis not present

## 2017-11-12 DIAGNOSIS — S0990XA Unspecified injury of head, initial encounter: Secondary | ICD-10-CM | POA: Diagnosis not present

## 2017-11-12 DIAGNOSIS — R110 Nausea: Secondary | ICD-10-CM | POA: Diagnosis not present

## 2017-11-12 DIAGNOSIS — S199XXA Unspecified injury of neck, initial encounter: Secondary | ICD-10-CM | POA: Diagnosis not present

## 2017-11-12 DIAGNOSIS — R42 Dizziness and giddiness: Secondary | ICD-10-CM | POA: Diagnosis not present

## 2017-11-12 DIAGNOSIS — S0003XA Contusion of scalp, initial encounter: Secondary | ICD-10-CM | POA: Diagnosis not present

## 2017-11-12 DIAGNOSIS — S4992XA Unspecified injury of left shoulder and upper arm, initial encounter: Secondary | ICD-10-CM | POA: Diagnosis not present

## 2017-11-12 DIAGNOSIS — R001 Bradycardia, unspecified: Secondary | ICD-10-CM | POA: Diagnosis not present

## 2017-11-16 ENCOUNTER — Telehealth: Payer: Self-pay | Admitting: Gastroenterology

## 2017-11-16 NOTE — Telephone Encounter (Signed)
Spoke with pt and let her know that the capsule was received in [place] and the results have been downloaded. Let her know that we will call her with the results as soon as Dr. Lyndel Safe has reviewed them.

## 2017-11-16 NOTE — Telephone Encounter (Signed)
Pt called stating that she mailed capsule to [place] on 11/01/17 and she has not heard anything if they received it. She wants to know anything about it. Pls call her.

## 2017-11-24 ENCOUNTER — Telehealth: Payer: Self-pay | Admitting: Gastroenterology

## 2017-11-24 DIAGNOSIS — Z6830 Body mass index (BMI) 30.0-30.9, adult: Secondary | ICD-10-CM | POA: Diagnosis not present

## 2017-11-24 DIAGNOSIS — L853 Xerosis cutis: Secondary | ICD-10-CM | POA: Diagnosis not present

## 2017-11-24 DIAGNOSIS — Z23 Encounter for immunization: Secondary | ICD-10-CM | POA: Diagnosis not present

## 2017-11-24 NOTE — Telephone Encounter (Signed)
Pt requesting capsule endo results.

## 2017-11-25 DIAGNOSIS — M79602 Pain in left arm: Secondary | ICD-10-CM | POA: Diagnosis not present

## 2017-11-25 DIAGNOSIS — R404 Transient alteration of awareness: Secondary | ICD-10-CM | POA: Diagnosis not present

## 2017-11-25 DIAGNOSIS — M79622 Pain in left upper arm: Secondary | ICD-10-CM | POA: Diagnosis not present

## 2017-11-25 DIAGNOSIS — Z8673 Personal history of transient ischemic attack (TIA), and cerebral infarction without residual deficits: Secondary | ICD-10-CM | POA: Diagnosis not present

## 2017-11-25 DIAGNOSIS — S0990XA Unspecified injury of head, initial encounter: Secondary | ICD-10-CM | POA: Diagnosis not present

## 2017-11-25 DIAGNOSIS — I110 Hypertensive heart disease with heart failure: Secondary | ICD-10-CM | POA: Diagnosis not present

## 2017-11-25 DIAGNOSIS — W19XXXA Unspecified fall, initial encounter: Secondary | ICD-10-CM | POA: Diagnosis not present

## 2017-11-25 DIAGNOSIS — I509 Heart failure, unspecified: Secondary | ICD-10-CM | POA: Diagnosis not present

## 2017-11-25 DIAGNOSIS — R51 Headache: Secondary | ICD-10-CM | POA: Diagnosis not present

## 2017-11-25 DIAGNOSIS — R0781 Pleurodynia: Secondary | ICD-10-CM | POA: Diagnosis not present

## 2017-11-25 DIAGNOSIS — R55 Syncope and collapse: Secondary | ICD-10-CM | POA: Diagnosis not present

## 2017-11-25 DIAGNOSIS — S299XXA Unspecified injury of thorax, initial encounter: Secondary | ICD-10-CM | POA: Diagnosis not present

## 2017-11-25 DIAGNOSIS — I4891 Unspecified atrial fibrillation: Secondary | ICD-10-CM | POA: Diagnosis not present

## 2017-11-25 DIAGNOSIS — R110 Nausea: Secondary | ICD-10-CM | POA: Diagnosis not present

## 2017-11-25 NOTE — Telephone Encounter (Signed)
Tried calling her but there was no answer. Capsule endoscopy: 10/28/2017: Small gastric polyp, erosion in proximal small bowel.  No bleeding. Recommend: Repeat EGD (off any blood thinners)

## 2017-11-26 ENCOUNTER — Telehealth: Payer: Self-pay

## 2017-11-26 ENCOUNTER — Telehealth: Payer: Self-pay | Admitting: Cardiology

## 2017-11-26 NOTE — Telephone Encounter (Signed)
Spoke with Dr. Wendy Poet office and he does not want pt to stop Plavix for EGD. Dr. Lyndel Safe notified. EGD is scheduled for 12/03/17.

## 2017-11-26 NOTE — Telephone Encounter (Signed)
Dr. Lyndel Safe has scheduled this pt for an endoscopy on 12/03/17@11 :30am. Patient is currently taking Plavix. Please advise regarding patient holding plavix prior to procedure.

## 2017-11-26 NOTE — Telephone Encounter (Signed)
Looks like we may have to do some biopsies-had some erosions on capsule endoscopy Can we hold Plavix for 3 days, if okay with Dr. Raliegh Ip? She can take baby aspirin while Plavix is on hold. Please let me know

## 2017-11-26 NOTE — Telephone Encounter (Signed)
Spoke with Vaughan Basta. Informed her that Dr. Agustin Cree does not want to hold plavix for endoscopy. She will inform Dr. Lyndel Safe.

## 2017-11-26 NOTE — Telephone Encounter (Signed)
Pt aware. States she is going to call back to schedule the EGD when her husband gets home.

## 2017-11-26 NOTE — Telephone Encounter (Signed)
Pt called to schd the EGD

## 2017-11-26 NOTE — Telephone Encounter (Signed)
Has questions about pt stopping Plavix

## 2017-11-26 NOTE — Telephone Encounter (Signed)
Pt scheduled for EGD in the [facility] 12/03/17@11 :30am. Pt to arrive at 10:30am. Pt to be NPO after midnight. Requesting for pt to hold Plavix for EGD from Dr. Agustin Cree. Will call pt back with instruction.

## 2017-11-29 NOTE — Telephone Encounter (Signed)
Yes, we can hold plavix for 3 days, she can take ASA instead for that time

## 2017-11-29 NOTE — Telephone Encounter (Signed)
Pl see Dr Marthann Schiller message Thx

## 2017-11-29 NOTE — Telephone Encounter (Signed)
Spoke with pt-advised pt of recommendation to hold Plavix x 3 days prior to procedure and to replace Plavix with baby ASA from cardiology standpoint; pt verbalized understanding of instructions via teach back method;

## 2017-11-30 ENCOUNTER — Telehealth: Payer: Self-pay

## 2017-11-30 MED ORDER — ISOSORBIDE MONONITRATE ER 30 MG PO TB24: 30.0000 mg | ORAL_TABLET | Freq: Every day | ORAL | 1 refills | Status: DC

## 2017-11-30 NOTE — Telephone Encounter (Signed)
Rx for isosorbide sent to pharmacy as requested.

## 2017-11-30 NOTE — Telephone Encounter (Signed)
Please see previous message

## 2017-12-03 ENCOUNTER — Ambulatory Visit (AMBULATORY_SURGERY_CENTER): Payer: Medicare Other | Admitting: Gastroenterology

## 2017-12-03 ENCOUNTER — Encounter: Payer: Self-pay | Admitting: Gastroenterology

## 2017-12-03 VITALS — BP 104/59 | HR 90 | Temp 97.8°F | Resp 18

## 2017-12-03 DIAGNOSIS — K449 Diaphragmatic hernia without obstruction or gangrene: Secondary | ICD-10-CM

## 2017-12-03 DIAGNOSIS — I4891 Unspecified atrial fibrillation: Secondary | ICD-10-CM | POA: Diagnosis not present

## 2017-12-03 DIAGNOSIS — K921 Melena: Secondary | ICD-10-CM | POA: Diagnosis not present

## 2017-12-03 DIAGNOSIS — I2510 Atherosclerotic heart disease of native coronary artery without angina pectoris: Secondary | ICD-10-CM | POA: Diagnosis not present

## 2017-12-03 DIAGNOSIS — K922 Gastrointestinal hemorrhage, unspecified: Secondary | ICD-10-CM

## 2017-12-03 DIAGNOSIS — I252 Old myocardial infarction: Secondary | ICD-10-CM | POA: Diagnosis not present

## 2017-12-03 MED ORDER — SODIUM CHLORIDE 0.9 % IV SOLN: 500.0000 mL | Freq: Once | INTRAVENOUS | Status: DC

## 2017-12-03 NOTE — Progress Notes (Signed)
Pt's states no medical or surgical changes since previsit or office visit. 

## 2017-12-03 NOTE — Patient Instructions (Addendum)
YOU HAD AN ENDOSCOPIC PROCEDURE TODAY AT [facility]:   Refer to the procedure report that was given to you for any specific questions about what was found during the examination.  If the procedure report does not answer your questions, please call your gastroenterologist to clarify.  If you requested that your care partner not be given the details of your procedure findings, then the procedure report has been included in a sealed envelope for you to review at your convenience later.  YOU SHOULD EXPECT: Some feelings of bloating in the abdomen. Passage of more gas than usual.  Walking can help get rid of the air that was put into your GI tract during the procedure and reduce the bloating. If you had a lower endoscopy (such as a colonoscopy or flexible sigmoidoscopy) you may notice spotting of blood in your stool or on the toilet paper. If you underwent a bowel prep for your procedure, you may not have a normal bowel movement for a few days.  Please Note:  You might notice some irritation and congestion in your nose or some drainage.  This is from the oxygen used during your procedure.  There is no need for concern and it should clear up in a day or so.  SYMPTOMS TO REPORT IMMEDIATELY:   Following Upper Endoscopy):  Vomiting of blood or coffee ground looking material  New chest pain or pain under the shoulder blades.  New shortness of breath.  Painful or persistent difficulty swallowing  Fever of 100F or higher  For urgent or emergent issues, a gastroenterologist can be reached at any hour by calling [phone].   DIET:  We do recommend a small meal at first, but then you may proceed to your regular diet.  Drink plenty of fluids but you should avoid alcoholic beverages for 24 hours.  MEDICATIONS: Resume previous medications. Resume Plavix (clopidogrel) at prior dose today.  FOLLOW UP: Return to Dr. Steve Rattler office in 12 weeks.  Please see handouts given to you by your  recovery nurse.  ACTIVITY:  You should plan to take it easy for the rest of today and you should NOT DRIVE or use heavy machinery until tomorrow (because of the sedation medicines used during the test).    FOLLOW UP: Our staff will call the number listed on your records the next business day following your procedure to check on you and address any questions or concerns that you may have regarding the information given to you following your procedure. If we do not reach you, we will leave a message.  However, if you are feeling well and you are not experiencing any problems, there is no need to return our call.  We will assume that you have returned to your regular daily activities without incident.  If any biopsies were taken you will be contacted by phone or by letter within the next 1-3 weeks.  Please call us at [phone] if you have not heard about the biopsies in 3 weeks.    SIGNATURES/CONFIDENTIALITY: You and/or your care partner have signed paperwork which will be entered into your electronic medical record.  These signatures attest to the fact that that the information above on your After Visit Summary has been reviewed and is understood.  Full responsibility of the confidentiality of this discharge information lies with you and/or your care-partner.

## 2017-12-03 NOTE — Progress Notes (Signed)
To recovery, report to RN, VSS. 

## 2017-12-03 NOTE — Op Note (Signed)
[facility] Patient Name: Amanda [name] Procedure Date: 12/03/2017 10:56 AM MRN: [id number] Endoscopist: Jackquline Denmark , MD Age: 77 Referring MD:  Date of Birth: [birth year] Gender: Female Account #: [id number] Procedure:                Upper GI endoscopy Indications:              #1. Obscure GI Bleeding on plavix. Hb 10.7                            (08/24/2017), was 7.5 (06/21/2017), s/p iron infusion                            and blood transfusions in the past. S/P EGD                            08/12/2016 by Dr. Dolphus Jenny showing small hiatal hernia,                            mild gastritis. S/P colonoscopy 08/12/2016 (Dr Dolphus Jenny)                            showing moderate pancolonic diverticulosis, small                            ascending colonic tubular adenoma, ischemic colitis                            and mod internal hemorrhoids. Colonoscopy                            11/21/2014 showing moderate colonic diverticulosis.                            CE showing small erosion in the 2nd portion of                            duodenum. Repeat EGD is being performed for further                            evaluation.                           #2. 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 H/O Ischemic colitis July 2018. Medicines:                Monitored Anesthesia Care Procedure:  Pre-Anesthesia Assessment:                           - Prior to the procedure, a History and Physical                            was performed, and patient medications and                            allergies were reviewed. The patient's tolerance of                            previous anesthesia was also reviewed. The risks                            and benefits of the procedure and the sedation                            options  and risks were discussed with the patient.                            All questions were answered, and informed consent                            was obtained. Prior Anticoagulants: The patient has                            taken Plavix (clopidogrel), last dose was 3 days                            prior to procedure. ASA Grade Assessment: III - A                            patient with severe systemic disease. After                            reviewing the risks and benefits, the patient was                            deemed in satisfactory condition to undergo the                            procedure.                           After obtaining informed consent, the endoscope was                            passed under direct vision. Throughout the                            procedure, the patient's blood pressure, pulse, and  oxygen saturations were monitored continuously. The                            Endoscope was introduced through the mouth, and                            advanced to the fourth part of duodenum. The upper                            GI endoscopy was accomplished without difficulty.                            The patient tolerated the procedure well. Scope In: Scope Out: Findings:                 The examined esophagus was normal.                           A small hiatal hernia was present.                           The examined duodenum was normal.                           No AVMs or any active bleeding. Complications:            No immediate complications. Estimated Blood Loss:     Estimated blood loss: none. Impression:               - Small hiatal hernia.                           - Normal examined duodenum.                           - No specimens collected. Recommendation:           - Patient has a contact number available for                            emergencies. The signs and symptoms of potential                             delayed complications were discussed with the                            patient. Return to normal activities tomorrow.                            Written discharge instructions were provided to the                            patient.                           - Resume previous diet.                           -  Resume previous medications.                           - Resume Plavix (clopidogrel) at prior dose today.                           - Return to GI clinic in 12 weeks. Trend CBC. Jackquline Denmark, MD 12/03/2017 11:12:30 AM This report has been signed electronically.

## 2017-12-06 ENCOUNTER — Telehealth: Payer: Self-pay

## 2017-12-06 NOTE — Telephone Encounter (Signed)
  Follow up Call-  Call back number 12/03/2017  Post procedure Call Back phone  # [phone]  Permission to leave phone message Yes     Patient questions:  Do you have a fever, pain , or abdominal swelling? No. Pain Score  0 *  Have you tolerated food without any problems? Yes.    Have you been able to return to your normal activities? Yes.    Do you have any questions about your discharge instructions: Diet   No. Medications  No. Follow up visit  No.  Do you have questions or concerns about your Care? No.  Actions: * If pain score is 4 or above: No action needed, pain <4.

## 2017-12-13 ENCOUNTER — Ambulatory Visit: Payer: Medicare Other | Admitting: Cardiology

## 2017-12-13 ENCOUNTER — Other Ambulatory Visit: Payer: Self-pay

## 2017-12-13 MED ORDER — PANTOPRAZOLE SODIUM 40 MG PO TBEC: 40.0000 mg | DELAYED_RELEASE_TABLET | Freq: Every day | ORAL | 1 refills | Status: DC

## 2017-12-13 NOTE — Telephone Encounter (Signed)
Refill sent as requested. 

## 2017-12-22 ENCOUNTER — Ambulatory Visit (INDEPENDENT_AMBULATORY_CARE_PROVIDER_SITE_OTHER): Payer: Medicare Other

## 2017-12-22 DIAGNOSIS — R55 Syncope and collapse: Secondary | ICD-10-CM | POA: Diagnosis not present

## 2017-12-22 DIAGNOSIS — I2510 Atherosclerotic heart disease of native coronary artery without angina pectoris: Secondary | ICD-10-CM

## 2017-12-22 NOTE — Progress Notes (Signed)
Complete echocardiogram has been performed.  Jimmy Tongela Encinas RDCS, RVT 

## 2017-12-23 ENCOUNTER — Encounter: Payer: Self-pay | Admitting: Cardiology

## 2017-12-23 ENCOUNTER — Ambulatory Visit (INDEPENDENT_AMBULATORY_CARE_PROVIDER_SITE_OTHER): Payer: Medicare Other | Admitting: Cardiology

## 2017-12-23 VITALS — BP 140/62 | HR 61 | Ht 63.0 in | Wt 168.0 lb

## 2017-12-23 DIAGNOSIS — Z95818 Presence of other cardiac implants and grafts: Secondary | ICD-10-CM

## 2017-12-23 DIAGNOSIS — R55 Syncope and collapse: Secondary | ICD-10-CM | POA: Diagnosis not present

## 2017-12-23 DIAGNOSIS — I480 Paroxysmal atrial fibrillation: Secondary | ICD-10-CM | POA: Diagnosis not present

## 2017-12-23 DIAGNOSIS — I129 Hypertensive chronic kidney disease with stage 1 through stage 4 chronic kidney disease, or unspecified chronic kidney disease: Secondary | ICD-10-CM | POA: Diagnosis not present

## 2017-12-23 DIAGNOSIS — N184 Chronic kidney disease, stage 4 (severe): Secondary | ICD-10-CM

## 2017-12-23 DIAGNOSIS — R001 Bradycardia, unspecified: Secondary | ICD-10-CM

## 2017-12-23 NOTE — Progress Notes (Signed)
Cardiology Office Note:    Date:  12/23/2017   ID:  [name], DOB [birth year], MRN [id number]  PCP:  Street, Sharon Mt, MD  Cardiologist:  Jenne Campus, MD    Referring MD: Street, Sharon Mt, *   Chief Complaint  Patient presents with  . 1 month follow up  Still have episode of near syncope and one full episode of syncope  History of Present Illness:    Amanda [name] is a 77 y.o. female with paroxysmal atrial fibrillation, not anticoagulated because of recurrent GI bleed status post watchman device.  Comes to me to discuss the issue of her passing out since have seen her last time she had one episode that she started feeling woozy that lasted about 10 seconds and then she fell the ground also got 2 additional episodes when she started feeling lightheaded but the sensation passed she did wear event recorder during the time she had no symptoms I brought her today to talk about potential loop recorder implantation she agreed to proceed that way.  I described to her procedure including all risk benefits as well as alternatives.  Overall she seems to be doing well trying to be active walk with a cane.  Past Medical History:  Diagnosis Date  . Acute ischemic stroke ([place])   . Acute metabolic encephalopathy   . Acute on chronic systolic (congestive) heart failure ([place])   . Atrial fibrillation ([place])   . Chronic anemia   . Chronic kidney disease (CKD), stage III (moderate) (HCC)   . Diabetes mellitus without complication ([place])   . Diverticulosis   . Gastroesophageal reflux   . Hyperlipidemia   . Hypertensive heart disease with heart failure ([place])   . Myocardial infarction ([place])   . OSA on CPAP   . Renal failure, chronic, stage 3 (moderate) (HCC)   . Systolic heart failure ([place])   . Thyroid disease     Past Surgical History:  Procedure Laterality Date  . CARDIAC CATHETERIZATION    . COLONOSCOPY  11/21/2014   Moderate predominantly sigmoid diverticulosis. Otherwise  noraml collonscopy to TI.   Marland Kitchen CORONARY ANGIOPLASTY WITH STENT PLACEMENT  08/2016  . EXCISIONAL HEMORRHOIDECTOMY    . LEFT ATRIAL APPENDAGE OCCLUSION  11/2016   in [place]  . NOSE SURGERY      Current Medications: Current Meds  Medication Sig  . albuterol (PROVENTIL HFA;VENTOLIN HFA) 108 (90 Base) MCG/ACT inhaler Inhale 2 puffs into the lungs every 6 (six) hours as needed for wheezing or shortness of breath.  Marland Kitchen aspirin EC 81 MG tablet Take 81 mg by mouth daily.  . cholecalciferol (VITAMIN D) 1000 units tablet Take 2,000 Units by mouth daily.  . clopidogrel (PLAVIX) 75 MG tablet Take 75 mg by mouth daily.  . Coenzyme Q10 100 MG TABS Take 100 mg by mouth daily at 12 noon.   . colchicine 0.6 MG tablet Take 0.6-1 mg by mouth See admin instructions. 1.2mg  at onset of gout attack, then 0.6mg  an hour later. Next day 0.6mg  twice a day, then 0.6mg  daily until gout pain subsides.  Marland Kitchen ezetimibe (ZETIA) 10 MG tablet Take 1 tablet (10 mg total) by mouth daily. Please schedule office visit  . FeAsp-FeFum -Suc-C-Thre-B12-FA (MULTIGEN PLUS PO) Take 1 tablet by mouth daily.  . febuxostat (ULORIC) 40 MG tablet Take 40 mg by mouth daily.  . folic acid (FOLVITE) 026 MCG tablet Take 800 mcg by mouth daily at 12 noon.   . furosemide (LASIX) 20 MG tablet  Take 60 mg by mouth daily with breakfast.  . furosemide (LASIX) 40 MG tablet Take 40 mg by mouth daily after lunch.   . Glucosamine-Chondroitin (OSTEO BI-FLEX REGULAR STRENGTH PO) Take 1 capsule by mouth daily at 12 noon.  . hydrALAZINE (APRESOLINE) 10 MG tablet Take 10 mg by mouth 2 (two) times daily.   . isosorbide mononitrate (IMDUR) 30 MG 24 hr tablet Take 1 tablet (30 mg total) by mouth daily.  . Levothyroxine Sodium 50 MCG CAPS Take 50 mcg by mouth daily before breakfast.   . multivitamin-lutein (OCUVITE-LUTEIN) CAPS capsule Take 1 capsule by mouth daily at 12 noon.  . nitroGLYCERIN (NITROSTAT) 0.4 MG SL tablet Place 0.4 mg under the tongue every 5  (five) minutes as needed for chest pain.  Marland Kitchen omega-3 acid ethyl esters (LOVAZA) 1 g capsule Take 1 capsule (1 g total) by mouth daily at 12 noon.  . pantoprazole (PROTONIX) 40 MG tablet Take 1 tablet (40 mg total) by mouth daily.  . pravastatin (PRAVACHOL) 40 MG tablet Take 40 mg by mouth daily.  . Probiotic Product (PRO-BIOTIC BLEND PO) Take 1 capsule by mouth daily.  . ranolazine (RANEXA) 1000 MG SR tablet Take 1 tablet (1,000 mg total) by mouth 2 (two) times daily.  . traMADol (ULTRAM) 50 MG tablet Take 50 mg by mouth every 12 (twelve) hours as needed for moderate pain.   . vitamin B-12 (CYANOCOBALAMIN) 1000 MCG tablet Take 1,000 mcg by mouth daily at 12 noon.  Marland Kitchen zolpidem (AMBIEN) 10 MG tablet Take 10 mg by mouth at bedtime as needed for sleep.   Current Facility-Administered Medications for the 12/23/17 encounter (Office Visit) with Park Liter, MD  Medication  . 0.9 %  sodium chloride infusion     Allergies:   Ciprofloxacin and Statins   Social History   Socioeconomic History  . Marital status: Married    Spouse name: Not on file  . Number of children: Not on file  . Years of education: Not on file  . Highest education level: Not on file  Occupational History  . Not on file  Social Needs  . Financial resource strain: Not on file  . Food insecurity:    Worry: Not on file    Inability: Not on file  . Transportation needs:    Medical: Not on file    Non-medical: Not on file  Tobacco Use  . Smoking status: Former Research scientist (life sciences)  . Smokeless tobacco: Never Used  Substance and Sexual Activity  . Alcohol use: No  . Drug use: No  . Sexual activity: Not on file  Lifestyle  . Physical activity:    Days per week: Not on file    Minutes per session: Not on file  . Stress: Not on file  Relationships  . Social connections:    Talks on phone: Not on file    Gets together: Not on file    Attends religious service: Not on file    Active member of club or organization: Not on  file    Attends meetings of clubs or organizations: Not on file    Relationship status: Not on file  Other Topics Concern  . Not on file  Social History Narrative  . Not on file     Family History: The patient's family history includes Breast cancer in her mother; Diabetes in her sister; Heart attack in her brother, maternal uncle, and sister; Hypertension in her father; Prostate cancer in her father; Stroke in her  father. ROS:   Please see the history of present illness.    All 14 point review of systems negative except as described per history of present illness  EKGs/Labs/Other Studies Reviewed:    Echocardiogram from 21 December 2017 Normal LVEF. Moderate LVH. Moderate LAE. Mild AS, MR.  Recent Labs: 01/15/2017: BUN 40; Creatinine, Ser 1.92; Potassium 4.3; Sodium 139 06/24/2017: Hemoglobin 7.3; Platelets 451  Recent Lipid Panel No results found for: CHOL, TRIG, HDL, CHOLHDL, VLDL, LDLCALC, LDLDIRECT  Physical Exam:    VS:  BP 140/62   Pulse 61   Ht 5\' 3"  (1.6 m)   Wt 168 lb (76.2 kg)   SpO2 97%   BMI 29.76 kg/m     Wt Readings from Last 3 Encounters:  12/23/17 168 lb (76.2 kg)  11/04/17 170 lb 9.6 oz (77.4 kg)  08/27/17 165 lb 6 oz (75 kg)     GEN:  Well nourished, well developed in no acute distress HEENT: Normal NECK: No JVD; No carotid bruits LYMPHATICS: No lymphadenopathy CARDIAC: RRR, systolic ejection murmur grade 1/6 to 2/6 best heard at the right upper portion of the sternum, no rubs, no gallops RESPIRATORY:  Clear to auscultation without rales, wheezing or rhonchi  ABDOMEN: Soft, non-tender, non-distended MUSCULOSKELETAL:  No edema; No deformity  SKIN: Warm and dry LOWER EXTREMITIES: no swelling NEUROLOGIC:  Alert and oriented x 3 PSYCHIATRIC:  Normal affect   ASSESSMENT:    1. Paroxysmal atrial fibrillation (HCC)   2. Benign hypertension with CKD (chronic kidney disease) stage IV ([place])   3. Syncope, unspecified syncope type   4.  Presence of Watchman left atrial appendage closure device   5. Bradycardia    PLAN:    In order of problems listed above:  1. Paroxysmal atrial fibrillation not anticoagulated rate controlled rhythm seems to be regular today. 2. Benign essential hypertension blood pressure controlled we will continue present management. 3. Syncope she will be sent for implantable loop recorder 4. Bradycardia again loop recorder will be implanted so far event recorder did not show any significant arrhythmia that would justify her syncope.   Medication Adjustments/Labs and Tests Ordered: Current medicines are reviewed at length with the patient today.  Concerns regarding medicines are outlined above.  No orders of the defined types were placed in this encounter.  Medication changes: No orders of the defined types were placed in this encounter.   Signed, Park Liter, MD, [facility] 12/23/2017 2:59 PM    [place]

## 2017-12-23 NOTE — Patient Instructions (Signed)
Medication Instructions:  Your physician recommends that you continue on your current medications as directed. Please refer to the Current Medication list given to you today.  If you need a refill on your cardiac medications before your next appointment, please call your pharmacy.   Lab work: None.  If you have labs (blood work) drawn today and your tests are completely normal, you will receive your results only by: Marland Kitchen MyChart Message (if you have MyChart) OR . A paper copy in the mail If you have any lab test that is abnormal or we need to change your treatment, we will call you to review the results.  Testing/Procedures: None.   Follow-Up: At [facility], you and your health needs are our priority.  As part of our continuing mission to provide you with exceptional heart care, we have created designated Provider Care Teams.  These Care Teams include your primary Cardiologist (physician) and Advanced Practice Providers (APPs -  Physician Assistants and Nurse Practitioners) who all work together to provide you with the care you need, when you need it. You will need a follow up appointment in 3 months.  Please call our office 2 months in advance to schedule this appointment.  You may see Jenne Campus, MD or another member of our [place] Provider Team in [place]: Shirlee More, MD . Jyl Heinz, MD  Any Other Special Instructions Will Be Listed Below (If Applicable).  Dr. Agustin Cree has referred you to see Dr. Curt Bears with electrophysiology. Their office should call you within a week for an appointment if not please call our office.

## 2017-12-28 DIAGNOSIS — I951 Orthostatic hypotension: Secondary | ICD-10-CM

## 2017-12-28 DIAGNOSIS — I5020 Unspecified systolic (congestive) heart failure: Secondary | ICD-10-CM | POA: Diagnosis not present

## 2017-12-28 DIAGNOSIS — I130 Hypertensive heart and chronic kidney disease with heart failure and stage 1 through stage 4 chronic kidney disease, or unspecified chronic kidney disease: Secondary | ICD-10-CM | POA: Diagnosis not present

## 2017-12-28 DIAGNOSIS — D509 Iron deficiency anemia, unspecified: Secondary | ICD-10-CM

## 2017-12-28 DIAGNOSIS — Z862 Personal history of diseases of the blood and blood-forming organs and certain disorders involving the immune mechanism: Secondary | ICD-10-CM | POA: Insufficient documentation

## 2017-12-28 DIAGNOSIS — D631 Anemia in chronic kidney disease: Secondary | ICD-10-CM | POA: Diagnosis not present

## 2017-12-28 DIAGNOSIS — N184 Chronic kidney disease, stage 4 (severe): Secondary | ICD-10-CM | POA: Diagnosis not present

## 2017-12-28 HISTORY — DX: Personal history of diseases of the blood and blood-forming organs and certain disorders involving the immune mechanism: Z86.2

## 2017-12-28 HISTORY — DX: Orthostatic hypotension: I95.1

## 2017-12-28 HISTORY — DX: Iron deficiency anemia, unspecified: D50.9

## 2018-01-05 ENCOUNTER — Ambulatory Visit: Payer: Medicare Other | Admitting: Cardiology

## 2018-01-05 ENCOUNTER — Encounter: Payer: Self-pay | Admitting: Cardiology

## 2018-01-05 VITALS — BP 124/64 | HR 61 | Ht 63.0 in | Wt 171.8 lb

## 2018-01-05 DIAGNOSIS — I480 Paroxysmal atrial fibrillation: Secondary | ICD-10-CM

## 2018-01-05 DIAGNOSIS — I10 Essential (primary) hypertension: Secondary | ICD-10-CM

## 2018-01-05 DIAGNOSIS — G4733 Obstructive sleep apnea (adult) (pediatric): Secondary | ICD-10-CM

## 2018-01-05 DIAGNOSIS — R55 Syncope and collapse: Secondary | ICD-10-CM

## 2018-01-05 NOTE — Patient Instructions (Signed)
Medication Instructions:  Your physician recommends that you continue on your current medications as directed. Please refer to the Current Medication list given to you today.  * If you need a refill on your cardiac medications before your next appointment, please call your pharmacy.   Labwork: None ordered  Testing/Procedures: Your physician has recommended that you have a loop recorder implanted.  LOOP RECORDER INSTRUCTIONS: 1.  Please report to the [facility] on 01/21/2018 at 1:30 pm 2.  You may have a light breakfast the morning of the procedure 3.  You may take all of your medications the morning of the procedure  Wash chest & neck area with the antibacterial soap/surgical scrub the night before and the morning of the procedure   Follow-Up: Your physician recommends that you schedule a follow-up appointment in: 7-10 days for wound check with our device clinic.  Your physician wants you to follow-up in: 1 year with Dr. Curt Bears in [place].  You will receive a reminder letter in the mail two months in advance. If you don't receive a letter, please call our office to schedule the follow-up appointment.  *Please note that any paperwork needing to be filled out by the provider will need to be addressed at the front desk prior to seeing the provider. Please note that any FMLA, disability or other documents regarding health condition is subject to a $25.00 charge that must be received prior to completion of paperwork in the form of a money order or check.  Thank you for choosing [facility]!!   Trinidad Curet, RN [phone]  Any Other Special Instructions Will Be Listed Below (If Applicable).   Implantable Loop Recorder Placement An implantable loop recorder is a small electronic device that is placed under the skin of your chest. It is about the size of an AA ("double A") battery. The device records the electrical activity of your heart  over a long period of time. Your health care provider can download these recordings to monitor your heart. You may need an implantable loop recorder if you have periods of abnormal heart activity (arrhythmias) or unexplained fainting (syncope) caused by a heart problem. Tell a health care provider about:  Any allergies you have.  All medicines you are taking, including vitamins, herbs, eye drops, creams, and over-the-counter medicines.  Any problems you or family members have had with anesthetic medicines.  Any blood disorders you have.  Any surgeries you have had.  Any medical conditions you have.  Whether you are pregnant or may be pregnant. What are the risks? Generally, this is a safe procedure. However, as with any procedure, problems may occur, including:  Infection.  Bleeding.  Allergic reactions to anesthetic medicines.  Damage to nerves or blood vessels.  Failure of the device to work. This could require another surgery to replace it.  What happens before the procedure?   You may have a physical exam, blood tests, and imaging tests of your heart, such as a chest X-ray.  Follow instructions from your health care provider about eating or drinking restrictions.  Ask your health care provider about: ? Changing or stopping your regular medicines. This is especially important if you are taking diabetes medicines or blood thinners. ? Taking medicines such as aspirin and ibuprofen. These medicines can thin your blood. Do not take these medicines before your procedure if your surgeon instructs you not to.  Ask your health care provider how your surgical site will be  marked or identified.  You may be given antibiotic medicine to help prevent infection.  Plan to have someone take you home after the procedure.  If you will be going home right after the procedure, plan to have someone with you for 24 hours.  Do not use any tobacco products, such as cigarettes, chewing  tobacco, and e-cigarettes as told by your surgeon. If you need help quitting, ask your health care provider. What happens during the procedure?  To reduce your risk of infection: ? Your health care team will wash or sanitize their hands. ? Your skin will be washed with soap.  An IV tube will be inserted into one of your veins.  You may be given an antibiotic medicine through the IV tube.  You may be given one or more of the following: ? A medicine to help you relax (sedative). ? A medicine to numb the area (local anesthetic).  A small cut (incision) will be made on the left side of your upper chest.  A pocket will be created under your skin.  The device will be placed in the pocket.  The incision will be closed with stitches (sutures) or adhesive strips.  A bandage (dressing) will be placed over the incision. The procedure may vary among health care providers and hospitals. What happens after the procedure?  Your blood pressure, heart rate, breathing rate, and blood oxygen level will be monitored often until the medicines you were given have worn off.  You may be able to go home on the day of your surgery. Before going home: ? Your health care provider will program your recorder. ? You will learn how to trigger your device with a handheld activator. ? You will learn how to send recordings to your health care provider. ? You will get an ID card for your device, and you will be told when to use it.  Do not drive for 24 hours if you received a sedative. This information is not intended to replace advice given to you by your health care provider. Make sure you discuss any questions you have with your health care provider. Document Released: 12/31/2014 Document Revised: 06/27/2015 Document Reviewed: 10/24/2014 Elsevier Interactive Patient Education  [employer].

## 2018-01-05 NOTE — Progress Notes (Signed)
Electrophysiology Office Note   Date:  01/05/2018   ID:  Amanda [name], DOB 1940/06/27, MRN [id number]  PCP:  Street, Sharon Mt, MD  Cardiologist: Agustin Cree Primary Electrophysiologist:  Berlie Persky Meredith Leeds, MD    No chief complaint on file.    History of Present Illness: Amanda [name] is a 77 y.o. female who is being seen today for the evaluation of atrial fibrillation, syncope at the request of Jenne Campus. Presenting today for electrophysiology evaluation.  She has a past history significant for ischemic stroke, atrial fibrillation, CKD stage III, diabetes hypertension OSA on CPAP.  She has a watchman implanted due to recurrent GI bleeds.  She had an episode of syncope.  She was sleeping well, wanting to go to the restroom.  She was sitting on the edge trying to get up and ended falling down.  It was thought this may be due to orthostasis.  He had an echo done that showed a normal ejection fraction and a cardiac monitor which showed PACs.  No atrial fibrillation was noted.  Few episodes of syncope in the last few months.  She did not have an episode while wearing the monitor.  Her episodes are associated with weakness in her shoulders, fatigue, and then she passes out or has near syncope.  There are no exacerbating or alleviating factors.  Today, she denies symptoms of palpitations, chest pain, shortness of breath, orthopnea, PND, lower extremity edema, claudication, dizziness, bleeding, or neurologic sequela. The patient is tolerating medications without difficulties.    Past Medical History:  Diagnosis Date  . Acute ischemic stroke ([place])   . Acute metabolic encephalopathy   . Acute on chronic systolic (congestive) heart failure ([place])   . Atrial fibrillation ([place])   . Chronic anemia   . Chronic kidney disease (CKD), stage III (moderate) (HCC)   . Diabetes mellitus without complication ([place])   . Diverticulosis   . Gastroesophageal reflux   . Hyperlipidemia   .  Hypertensive heart disease with heart failure ([place])   . Myocardial infarction ([place])   . OSA on CPAP   . Renal failure, chronic, stage 3 (moderate) (HCC)   . Systolic heart failure ([place])   . Thyroid disease    Past Surgical History:  Procedure Laterality Date  . CARDIAC CATHETERIZATION    . COLONOSCOPY  11/21/2014   Moderate predominantly sigmoid diverticulosis. Otherwise noraml collonscopy to TI.   Marland Kitchen CORONARY ANGIOPLASTY WITH STENT PLACEMENT  08/2016  . EXCISIONAL HEMORRHOIDECTOMY    . LEFT ATRIAL APPENDAGE OCCLUSION  11/2016   in [place]  . NOSE SURGERY       Current Outpatient Medications  Medication Sig Dispense Refill  . albuterol (PROVENTIL HFA;VENTOLIN HFA) 108 (90 Base) MCG/ACT inhaler Inhale 2 puffs into the lungs every 6 (six) hours as needed for wheezing or shortness of breath.    Marland Kitchen aspirin EC 81 MG tablet Take 81 mg by mouth daily.    . cholecalciferol (VITAMIN D) 1000 units tablet Take 2,000 Units by mouth daily.    . clopidogrel (PLAVIX) 75 MG tablet Take 75 mg by mouth daily.    . Coenzyme Q10 100 MG TABS Take 100 mg by mouth daily at 12 noon.     . colchicine 0.6 MG tablet Take 0.6-1 mg by mouth See admin instructions. 1.2mg  at onset of gout attack, then 0.6mg  an hour later. Next day 0.6mg  twice a day, then 0.6mg  daily until gout pain subsides.    Marland Kitchen ezetimibe (ZETIA) 10 MG  tablet Take 1 tablet (10 mg total) by mouth daily. Please schedule office visit 90 tablet 2  . febuxostat (ULORIC) 40 MG tablet Take 40 mg by mouth daily.    . ferrous sulfate 325 (65 FE) MG EC tablet Take 1 tablet by mouth 2 (two) times daily.    . folic acid (FOLVITE) 756 MCG tablet Take 800 mcg by mouth daily at 12 noon.     . furosemide (LASIX) 20 MG tablet Take 1 tablet by mouth 3 (three) times daily. 2 tablets in the morning and 1 tablet at lunch    . Glucosamine-Chondroitin (OSTEO BI-FLEX REGULAR STRENGTH PO) Take 1 capsule by mouth daily at 12 noon.    . isosorbide mononitrate (IMDUR) 30  MG 24 hr tablet Take 1 tablet (30 mg total) by mouth daily. 30 tablet 1  . Levothyroxine Sodium 50 MCG CAPS Take 50 mcg by mouth daily before breakfast.     . multivitamin-lutein (OCUVITE-LUTEIN) CAPS capsule Take 1 capsule by mouth daily at 12 noon.    . nitroGLYCERIN (NITROSTAT) 0.4 MG SL tablet Place 0.4 mg under the tongue every 5 (five) minutes as needed for chest pain.    Marland Kitchen omega-3 acid ethyl esters (LOVAZA) 1 g capsule Take 1 capsule (1 g total) by mouth daily at 12 noon. 30 capsule 6  . pantoprazole (PROTONIX) 40 MG tablet Take 1 tablet (40 mg total) by mouth daily. 90 tablet 1  . pravastatin (PRAVACHOL) 40 MG tablet Take 40 mg by mouth daily.    . Probiotic Product (PRO-BIOTIC BLEND PO) Take 1 capsule by mouth daily.    . ranolazine (RANEXA) 1000 MG SR tablet Take 1 tablet (1,000 mg total) by mouth 2 (two) times daily. 180 tablet 1  . traMADol (ULTRAM) 50 MG tablet Take 50 mg by mouth every 12 (twelve) hours as needed for moderate pain.     . vitamin B-12 (CYANOCOBALAMIN) 1000 MCG tablet Take 1,000 mcg by mouth daily at 12 noon.    Marland Kitchen zolpidem (AMBIEN) 10 MG tablet Take 10 mg by mouth at bedtime as needed for sleep.     Current Facility-Administered Medications  Medication Dose Route Frequency Provider Last Rate Last Dose  . 0.9 %  sodium chloride infusion  500 mL Intravenous Once Amanda Denmark, MD        Allergies:   Ciprofloxacin and Statins   Social History:  The patient  reports that she has quit smoking. She has never used smokeless tobacco. She reports that she does not drink alcohol or use drugs.   Family History:  The patient's family history includes Breast cancer in her mother; Diabetes in her sister; Heart attack in her brother, maternal uncle, and sister; Hypertension in her father; Prostate cancer in her father; Stroke in her father.    ROS:  Please see the history of present illness.   Otherwise, review of systems is positive for none.   All other systems are reviewed  and negative.    PHYSICAL EXAM: VS:  BP 124/64   Pulse 61   Ht 5\' 3"  (1.6 m)   Wt 171 lb 12.8 oz (77.9 kg)   SpO2 99%   BMI 30.43 kg/m  , BMI Body mass index is 30.43 kg/m. GEN: Well nourished, well developed, in no acute distress  HEENT: normal  Neck: no JVD, carotid bruits, or masses Cardiac: RRR; no murmurs, rubs, or gallops,no edema  Respiratory:  clear to auscultation bilaterally, normal work of breathing GI: soft, nontender,  nondistended, + BS MS: no deformity or atrophy  Skin: warm and dry Neuro:  Strength and sensation are intact Psych: euthymic mood, full affect  EKG:  EKG is ordered today. Personal review of the ekg ordered shows SR, rate 61, RAD  Recent Labs: 01/15/2017: BUN 40; Creatinine, Ser 1.92; Potassium 4.3; Sodium 139 06/24/2017: Hemoglobin 7.3; Platelets 451    Lipid Panel  No results found for: CHOL, TRIG, HDL, CHOLHDL, VLDL, LDLCALC, LDLDIRECT   Wt Readings from Last 3 Encounters:  01/05/18 171 lb 12.8 oz (77.9 kg)  12/23/17 168 lb (76.2 kg)  11/04/17 170 lb 9.6 oz (77.4 kg)      Other studies Reviewed: Additional studies/ records that were reviewed today include: TTE 12/22/2017 Review of the above records today demonstrates:  - Left ventricle: The cavity size was normal. Wall thickness was   increased in a pattern of moderate LVH. Systolic function was   normal. The estimated ejection fraction was in the range of 60%   to 65%. Wall motion was normal; there were no regional wall   motion abnormalities. Features are consistent with a pseudonormal   left ventricular filling pattern, with concomitant abnormal   relaxation and increased filling pressure (grade 2 diastolic   dysfunction). - Aortic valve: There was mild stenosis. Valve area (VTI): 1.57   cm^2. Valve area (Vmax): 1.45 cm^2. Valve area (Vmean): 1.57   cm^2. - Mitral valve: There was mild regurgitation. Valve area by   pressure half-time: 2.34 cm^2. - Left atrium: The atrium was  moderately dilated.  Cardiac monitor 10/11/2017-personally reviewed Baseline rhythm: Sinus rhythm Atrial arrhythmia:.  Of APCs felt as fluttering or palpitations.  No clear-cut sustained arrhythmia.  No clear-cut atrial fibrillation identified Symptoms: A trigger events some because of palpitations patient got some APCs during those episodes.   ASSESSMENT AND PLAN:  1.  Paroxysmal atrial fibrillation: Is status post watchman and not on anticoagulation.  No changes at this time.  This patients CHA2DS2-VASc Score and unadjusted Ischemic Stroke Rate (% per year) is equal to 11.2 % stroke rate/year from a score of 7  Above score calculated as 1 point each if present [CHF, HTN, DM, Vascular=MI/PAD/Aortic Plaque, Age if 65-74, or Female] Above score calculated as 2 points each if present [Age > 75, or Stroke/TIA/TE]  2.  OSA: CPAP compliance encouraged  3.  Hypertension: Well-controlled today.  4.  Syncope: At this point it is unclear as to the cause of her syncope.  She had a weakness and fatigue, but no palpitations.  She does have atrial fibrillation and I am curious to see if these are posttermination pauses.  She wore a 30-day monitor that showed no major abnormalities.  We Imari Sivertsen plan for Linq monitor implant.  Risks and benefits were discussed.  Risks include bleeding and infection.  The patient understands the risks and is agreed to the procedure.  Current medicines are reviewed at length with the patient today.   The patient does not have concerns regarding her medicines.  The following changes were made today:  none  Labs/ tests ordered today include:  Orders Placed This Encounter  Procedures  . EKG 12-Lead   Case discussed with primary cardiology  Disposition:   FU with Madyson Lukach 1 year  Signed, Aleah Ahlgrim Meredith Leeds, MD  01/05/2018 2:27 PM     [place] [address] [place] [address] [phone] (office) [phone] (fax)

## 2018-01-11 DIAGNOSIS — N184 Chronic kidney disease, stage 4 (severe): Secondary | ICD-10-CM | POA: Diagnosis not present

## 2018-01-11 DIAGNOSIS — D631 Anemia in chronic kidney disease: Secondary | ICD-10-CM | POA: Diagnosis not present

## 2018-01-21 ENCOUNTER — Ambulatory Visit (HOSPITAL_COMMUNITY): Admission: RE | Admit: 2018-01-21 | Payer: Medicare Other | Source: Home / Self Care | Admitting: Cardiology

## 2018-01-21 ENCOUNTER — Telehealth: Payer: Self-pay | Admitting: Cardiology

## 2018-01-21 ENCOUNTER — Encounter (HOSPITAL_COMMUNITY): Admission: RE | Payer: Self-pay | Source: Home / Self Care

## 2018-01-21 DIAGNOSIS — N184 Chronic kidney disease, stage 4 (severe): Secondary | ICD-10-CM | POA: Diagnosis not present

## 2018-01-21 SURGERY — LOOP RECORDER INSERTION

## 2018-01-21 NOTE — Telephone Encounter (Signed)
Spoke with the patient, she passed out earlier today and her husband is not willing to drive her to the hospital. He wanted to reschedule to another day that she feels better.

## 2018-01-21 NOTE — Telephone Encounter (Signed)
Advised the patient's husband that she really needs this device placed. However, he stated he was not going to bring her and that it needs to be rescheduled.

## 2018-01-21 NOTE — Telephone Encounter (Signed)
  Patient has had an episode this morning and wants to cancel her appt today with the cath lab. Her appt was at 2:30 today

## 2018-01-25 DIAGNOSIS — N184 Chronic kidney disease, stage 4 (severe): Secondary | ICD-10-CM | POA: Diagnosis not present

## 2018-01-25 DIAGNOSIS — D631 Anemia in chronic kidney disease: Secondary | ICD-10-CM | POA: Diagnosis not present

## 2018-01-31 ENCOUNTER — Ambulatory Visit: Payer: Medicare Other

## 2018-01-31 ENCOUNTER — Telehealth: Payer: Self-pay | Admitting: Cardiology

## 2018-01-31 NOTE — Telephone Encounter (Signed)
Pt would like to go 1/2. She is aware I will call her tomorrow afternoon/evening to let her know time.   She is agreeable to plan.

## 2018-01-31 NOTE — Telephone Encounter (Signed)
   Patient is calling to see about getting her loop recorder placement scheduled.

## 2018-02-01 ENCOUNTER — Other Ambulatory Visit: Payer: Self-pay | Admitting: Emergency Medicine

## 2018-02-01 MED ORDER — ISOSORBIDE MONONITRATE ER 30 MG PO TB24: 30.0000 mg | ORAL_TABLET | Freq: Every day | ORAL | 1 refills | Status: DC

## 2018-02-01 MED ORDER — OMEGA-3-ACID ETHYL ESTERS 1 G PO CAPS: 1.0000 g | ORAL_CAPSULE | Freq: Every day | ORAL | 1 refills | Status: DC

## 2018-02-01 NOTE — Telephone Encounter (Signed)
Apologized to pt for not getting her ILR scheduled. She is aware I will call her by the end of the week/beginning of next to get her scheduled in the next several weeks. She is agreeable to plan.

## 2018-02-08 DIAGNOSIS — I5020 Unspecified systolic (congestive) heart failure: Secondary | ICD-10-CM | POA: Diagnosis not present

## 2018-02-08 DIAGNOSIS — D631 Anemia in chronic kidney disease: Secondary | ICD-10-CM | POA: Diagnosis not present

## 2018-02-08 DIAGNOSIS — N184 Chronic kidney disease, stage 4 (severe): Secondary | ICD-10-CM | POA: Diagnosis not present

## 2018-02-08 DIAGNOSIS — I130 Hypertensive heart and chronic kidney disease with heart failure and stage 1 through stage 4 chronic kidney disease, or unspecified chronic kidney disease: Secondary | ICD-10-CM | POA: Diagnosis not present

## 2018-02-16 NOTE — Telephone Encounter (Signed)
Rescheduled LINQ implant for 02/23/18. Wound check appt scheduled for 1/31. Post implant appt scheduled for 4/27. Procedure instructions reviewed w/ pt.  Patient verbalized understanding and agreeable to plan.

## 2018-02-23 ENCOUNTER — Other Ambulatory Visit: Payer: Self-pay

## 2018-02-23 ENCOUNTER — Encounter (HOSPITAL_COMMUNITY)
Admission: RE | Disposition: A | Discharge status: Home / Self Care | Payer: Self-pay | Source: Home / Self Care | Attending: Cardiology

## 2018-02-23 ENCOUNTER — Ambulatory Visit (HOSPITAL_COMMUNITY)
Admission: RE | Admit: 2018-02-23 | Discharge: 2018-02-23 | Disposition: A | Discharge status: Home / Self Care | Payer: Medicare Other | Attending: Cardiology | Admitting: Cardiology

## 2018-02-23 ENCOUNTER — Encounter (HOSPITAL_COMMUNITY): Payer: Self-pay | Admitting: Cardiology

## 2018-02-23 DIAGNOSIS — G4733 Obstructive sleep apnea (adult) (pediatric): Secondary | ICD-10-CM | POA: Diagnosis not present

## 2018-02-23 DIAGNOSIS — Z87891 Personal history of nicotine dependence: Secondary | ICD-10-CM | POA: Diagnosis not present

## 2018-02-23 DIAGNOSIS — E079 Disorder of thyroid, unspecified: Secondary | ICD-10-CM | POA: Insufficient documentation

## 2018-02-23 DIAGNOSIS — Z888 Allergy status to other drugs, medicaments and biological substances status: Secondary | ICD-10-CM | POA: Insufficient documentation

## 2018-02-23 DIAGNOSIS — Z881 Allergy status to other antibiotic agents status: Secondary | ICD-10-CM | POA: Diagnosis not present

## 2018-02-23 DIAGNOSIS — R55 Syncope and collapse: Secondary | ICD-10-CM | POA: Diagnosis not present

## 2018-02-23 DIAGNOSIS — Z9289 Personal history of other medical treatment: Secondary | ICD-10-CM | POA: Insufficient documentation

## 2018-02-23 DIAGNOSIS — Z79899 Other long term (current) drug therapy: Secondary | ICD-10-CM | POA: Diagnosis not present

## 2018-02-23 DIAGNOSIS — Z8673 Personal history of transient ischemic attack (TIA), and cerebral infarction without residual deficits: Secondary | ICD-10-CM | POA: Insufficient documentation

## 2018-02-23 DIAGNOSIS — N183 Chronic kidney disease, stage 3 (moderate): Secondary | ICD-10-CM | POA: Diagnosis not present

## 2018-02-23 DIAGNOSIS — Z833 Family history of diabetes mellitus: Secondary | ICD-10-CM | POA: Insufficient documentation

## 2018-02-23 DIAGNOSIS — I252 Old myocardial infarction: Secondary | ICD-10-CM | POA: Insufficient documentation

## 2018-02-23 DIAGNOSIS — Z955 Presence of coronary angioplasty implant and graft: Secondary | ICD-10-CM | POA: Insufficient documentation

## 2018-02-23 DIAGNOSIS — I480 Paroxysmal atrial fibrillation: Secondary | ICD-10-CM | POA: Insufficient documentation

## 2018-02-23 DIAGNOSIS — E1122 Type 2 diabetes mellitus with diabetic chronic kidney disease: Secondary | ICD-10-CM | POA: Insufficient documentation

## 2018-02-23 DIAGNOSIS — K219 Gastro-esophageal reflux disease without esophagitis: Secondary | ICD-10-CM | POA: Diagnosis not present

## 2018-02-23 DIAGNOSIS — I5042 Chronic combined systolic (congestive) and diastolic (congestive) heart failure: Secondary | ICD-10-CM | POA: Insufficient documentation

## 2018-02-23 DIAGNOSIS — Z8249 Family history of ischemic heart disease and other diseases of the circulatory system: Secondary | ICD-10-CM | POA: Insufficient documentation

## 2018-02-23 DIAGNOSIS — I129 Hypertensive chronic kidney disease with stage 1 through stage 4 chronic kidney disease, or unspecified chronic kidney disease: Secondary | ICD-10-CM | POA: Diagnosis not present

## 2018-02-23 DIAGNOSIS — Z7982 Long term (current) use of aspirin: Secondary | ICD-10-CM | POA: Insufficient documentation

## 2018-02-23 DIAGNOSIS — E785 Hyperlipidemia, unspecified: Secondary | ICD-10-CM | POA: Insufficient documentation

## 2018-02-23 HISTORY — DX: Personal history of other medical treatment: Z92.89

## 2018-02-23 HISTORY — PX: LOOP RECORDER INSERTION: EP1214

## 2018-02-23 LAB — GLUCOSE, CAPILLARY: Glucose-Capillary: 124 mg/dL — ABNORMAL HIGH (ref 70–99)

## 2018-02-23 SURGERY — LOOP RECORDER INSERTION

## 2018-02-23 MED ORDER — LIDOCAINE-EPINEPHRINE 1 %-1:100000 IJ SOLN
INTRAMUSCULAR | Status: AC
  Filled 2018-02-23: qty 1

## 2018-02-23 MED ORDER — LIDOCAINE-EPINEPHRINE 1 %-1:100000 IJ SOLN
INTRAMUSCULAR | Status: DC | PRN
  Administered 2018-02-23: 30 mL

## 2018-02-23 SURGICAL SUPPLY — 2 items
LOOP REVEAL LINQSYS (Prosthesis & Implant Heart) ×2 IMPLANT
PACK LOOP INSERTION (CUSTOM PROCEDURE TRAY) ×2 IMPLANT

## 2018-02-23 NOTE — H&P (Signed)
Amanda [name] has presented today for surgery, with the diagnosis of syncope.  The various methods of treatment have been discussed with the patient and family. After consideration of risks, benefits and other options for treatment, the patient has consented to  Procedure(s): LINQ implant as a surgical intervention .  Risks include but not limited to bleeding,  infection, among others. The patient's history has been reviewed, patient examined, no change in status, stable for surgery.  I have reviewed the patient's chart and labs.  Questions were answered to the patient's satisfaction.    Will Amanda Bears, MD 02/23/2018 1:23 PM

## 2018-02-23 NOTE — Discharge Instructions (Signed)
Post implant site/wound care Keep incision clean and dry for 3 days. You can remove outer dressing tomorrow. Leave steri-strips (little pieces of tape) on until seen in the office for wound check appointment. Call the office [phone]) for redness, drainage, swelling, or fever.

## 2018-02-28 NOTE — H&P (Signed)
Amanda [name] has presented today for surgery, with the diagnosis of syncope.  The various methods of treatment have been discussed with the patient and family. After consideration of risks, benefits and other options for treatment, the patient has consented to  Procedure(s): LINQ insertion as a surgical intervention .  Risks include but not limited to bleeding, tamponade, infection, pneumothorax, among others. The patient's history has been reviewed, patient examined, no change in status, stable for surgery.  I have reviewed the patient's chart and labs.  Questions were answered to the patient's satisfaction.    Allegra Lai, MD 02/28/2018 11:41 AM         Electrophysiology Office Note   Date:  02/28/2018   ID:  Amanda [name], DOB September 12, 1940, MRN [id number]  PCP:  Street, Sharon Mt, MD  Cardiologist: Agustin Cree Primary Electrophysiologist:   Meredith Leeds, MD    No chief complaint on file.    History of Present Illness: Amanda [name] is a 78 y.o. female who is being seen today for the evaluation of atrial fibrillation, syncope at the request of Jenne Campus. Presenting today for electrophysiology evaluation.  She has a past history significant for ischemic stroke, atrial fibrillation, CKD stage III, diabetes hypertension OSA on CPAP.  She has a watchman implanted due to recurrent GI bleeds.  She had an episode of syncope.  She was sleeping well, wanting to go to the restroom.  She was sitting on the edge trying to get up and ended falling down.  It was thought this may be due to orthostasis.  He had an echo done that showed a normal ejection fraction and a cardiac monitor which showed PACs.  No atrial fibrillation was noted.  Few episodes of syncope in the last few months.  She did not have an episode while wearing the monitor.  Her episodes are associated with weakness in her shoulders, fatigue, and then she passes out or has near syncope.  There are no exacerbating or  alleviating factors.  Today, she denies symptoms of palpitations, chest pain, shortness of breath, orthopnea, PND, lower extremity edema, claudication, dizziness, bleeding, or neurologic sequela. The patient is tolerating medications without difficulties.    Past Medical History:  Diagnosis Date  . Acute ischemic stroke ([place])   . Acute metabolic encephalopathy   . Acute on chronic systolic (congestive) heart failure ([place])   . Atrial fibrillation ([place])   . Chronic anemia   . Chronic kidney disease (CKD), stage III (moderate) (HCC)   . Diabetes mellitus without complication ([place])   . Diverticulosis   . Gastroesophageal reflux   . Hyperlipidemia   . Hypertensive heart disease with heart failure ([place])   . Myocardial infarction ([place])   . OSA on CPAP   . Renal failure, chronic, stage 3 (moderate) (HCC)   . Systolic heart failure ([place])   . Thyroid disease    Past Surgical History:  Procedure Laterality Date  . CARDIAC CATHETERIZATION    . COLONOSCOPY  11/21/2014   Moderate predominantly sigmoid diverticulosis. Otherwise noraml collonscopy to TI.   Marland Kitchen CORONARY ANGIOPLASTY WITH STENT PLACEMENT  08/2016  . EXCISIONAL HEMORRHOIDECTOMY    . LEFT ATRIAL APPENDAGE OCCLUSION  11/2016   in [place]  . LOOP RECORDER INSERTION N/A 02/23/2018   Procedure: LOOP RECORDER INSERTION;  Surgeon: Constance Haw, MD;  Location: [facility];  Service: Cardiovascular;  Laterality: N/A;  . NOSE SURGERY       Current Facility-Administered Medications  Medication Dose Route Frequency  Provider Last Rate Last Dose  . 0.9 %  sodium chloride infusion  500 mL Intravenous Once Jackquline Denmark, MD       Current Outpatient Medications  Medication Sig Dispense Refill  . aspirin EC 81 MG tablet Take 81 mg by mouth daily.    . Cholecalciferol (VITAMIN D3) 50 MCG (2000 UT) TABS Take 2,000 Units by mouth daily at 12 noon.    . clopidogrel (PLAVIX) 75 MG tablet Take 75 mg by mouth daily.    . Coenzyme  Q10 100 MG TABS Take 100 mg by mouth daily at 12 noon.     Marland Kitchen estradiol (ESTRACE) 0.1 MG/GM vaginal cream Place 1 Applicatorful vaginally 3 (three) times a week.    . ezetimibe (ZETIA) 10 MG tablet Take 1 tablet (10 mg total) by mouth daily. Please schedule office visit (Patient taking differently: Take 10 mg by mouth every evening. Please schedule office visit) 90 tablet 2  . febuxostat (ULORIC) 40 MG tablet Take 40 mg by mouth every evening.     . ferrous sulfate 325 (65 FE) MG EC tablet Take 325 mg by mouth 2 (two) times daily.     . folic acid (FOLVITE) 176 MCG tablet Take 400 mcg by mouth daily at 12 noon.     . furosemide (LASIX) 20 MG tablet Take 20-40 mg by mouth See admin instructions. Take 2 tablets (40 mg) by mouth daily in the morning & 1 tablet (20 mg) by mouth at lunch    . Glucosamine-Chondroitin (OSTEO BI-FLEX REGULAR STRENGTH PO) Take 1 tablet by mouth daily at 12 noon.    . isosorbide mononitrate (IMDUR) 30 MG 24 hr tablet Take 1 tablet (30 mg total) by mouth daily. 90 tablet 1  . levothyroxine (SYNTHROID, LEVOTHROID) 50 MCG tablet Take 50 mcg by mouth daily before breakfast.    . Multiple Vitamins-Minerals (OCUVITE ADULT 50+ PO) Take 1 tablet by mouth daily at 12 noon.     Marland Kitchen omega-3 acid ethyl esters (LOVAZA) 1 g capsule Take 1 capsule (1 g total) by mouth daily at 12 noon. 90 capsule 1  . pantoprazole (PROTONIX) 40 MG tablet Take 1 tablet (40 mg total) by mouth daily. 90 tablet 1  . polyethylene glycol powder (GLYCOLAX/MIRALAX) powder Take 17 g by mouth every other day. Alternating between miralax and benefiber    . pravastatin (PRAVACHOL) 40 MG tablet Take 40 mg by mouth every evening.     . Probiotic Product (PRO-BIOTIC BLEND PO) Take 1 capsule by mouth daily at 12 noon.     . ranolazine (RANEXA) 1000 MG SR tablet Take 1 tablet (1,000 mg total) by mouth 2 (two) times daily. 180 tablet 1  . traMADol (ULTRAM) 50 MG tablet Take 50 mg by mouth every 12 (twelve) hours as needed for  moderate pain.     . vitamin B-12 (CYANOCOBALAMIN) 1000 MCG tablet Take 1,000 mcg by mouth daily at 12 noon.    . Wheat Dextrin (BENEFIBER PO) Take 1 Dose by mouth every other day. Alternating between benefiber & miralax    . zolpidem (AMBIEN) 10 MG tablet Take 10 mg by mouth at bedtime as needed for sleep.    Marland Kitchen albuterol (PROVENTIL HFA;VENTOLIN HFA) 108 (90 Base) MCG/ACT inhaler Inhale 2 puffs into the lungs every 6 (six) hours as needed for wheezing or shortness of breath.    . colchicine 0.6 MG tablet Take 0.6-1.2 mg by mouth daily as needed (for gout attack). 1.2mg  at onset of gout  attack, then 0.6mg  an hour later. Next day 0.6mg  twice a day, then 0.6mg  daily until gout pain subsides.     . nitroGLYCERIN (NITROSTAT) 0.4 MG SL tablet Place 0.4 mg under the tongue every 5 (five) minutes as needed for chest pain.      Allergies:   Ciprofloxacin and Statins   Social History:  The patient  reports that she has quit smoking. She has never used smokeless tobacco. She reports that she does not drink alcohol or use drugs.   Family History:  The patient's family history includes Breast cancer in her mother; Diabetes in her sister; Heart attack in her brother, maternal uncle, and sister; Hypertension in her father; Prostate cancer in her father; Stroke in her father.    ROS:  Please see the history of present illness.   Otherwise, review of systems is positive for none.   All other systems are reviewed and negative.    PHYSICAL EXAM: VS:  BP (!) 177/67   Pulse (!) 59   Temp 98.5 F (36.9 C) (Temporal)   SpO2 100%  , BMI There is no height or weight on file to calculate BMI. GEN: Well nourished, well developed, in no acute distress  HEENT: normal  Neck: no JVD, carotid bruits, or masses Cardiac: RRR; no murmurs, rubs, or gallops,no edema  Respiratory:  clear to auscultation bilaterally, normal work of breathing GI: soft, nontender, nondistended, + BS MS: no deformity or atrophy  Skin: warm  and dry Neuro:  Strength and sensation are intact Psych: euthymic mood, full affect  EKG:  EKG is ordered today. Personal review of the ekg ordered shows SR, rate 61, RAD  Recent Labs: 06/24/2017: Hemoglobin 7.3; Platelets 451    Lipid Panel  No results found for: CHOL, TRIG, HDL, CHOLHDL, VLDL, LDLCALC, LDLDIRECT   Wt Readings from Last 3 Encounters:  01/05/18 77.9 kg  12/23/17 76.2 kg  11/04/17 77.4 kg      Other studies Reviewed: Additional studies/ records that were reviewed today include: TTE 12/22/2017 Review of the above records today demonstrates:  - Left ventricle: The cavity size was normal. Wall thickness was   increased in a pattern of moderate LVH. Systolic function was   normal. The estimated ejection fraction was in the range of 60%   to 65%. Wall motion was normal; there were no regional wall   motion abnormalities. Features are consistent with a pseudonormal   left ventricular filling pattern, with concomitant abnormal   relaxation and increased filling pressure (grade 2 diastolic   dysfunction). - Aortic valve: There was mild stenosis. Valve area (VTI): 1.57   cm^2. Valve area (Vmax): 1.45 cm^2. Valve area (Vmean): 1.57   cm^2. - Mitral valve: There was mild regurgitation. Valve area by   pressure half-time: 2.34 cm^2. - Left atrium: The atrium was moderately dilated.  Cardiac monitor 10/11/2017-personally reviewed Baseline rhythm: Sinus rhythm Atrial arrhythmia:.  Of APCs felt as fluttering or palpitations.  No clear-cut sustained arrhythmia.  No clear-cut atrial fibrillation identified Symptoms: A trigger events some because of palpitations patient got some APCs during those episodes.   ASSESSMENT AND PLAN:  1.  Paroxysmal atrial fibrillation: Is status post watchman and not on anticoagulation.  No changes at this time.  This patients CHA2DS2-VASc Score and unadjusted Ischemic Stroke Rate (% per year) is equal to 11.2 % stroke rate/year from a  score of 7  Above score calculated as 1 point each if present [CHF, HTN, DM, Vascular=MI/PAD/Aortic Plaque, Age  if 65-74, or Female] Above score calculated as 2 points each if present [Age > 75, or Stroke/TIA/TE]  2.  OSA: CPAP compliance encouraged  3.  Hypertension: Well-controlled today.  4.  Syncope: At this point it is unclear as to the cause of her syncope.  She had a weakness and fatigue, but no palpitations.  She does have atrial fibrillation and I am curious to see if these are posttermination pauses.  She wore a 30-day monitor that showed no major abnormalities.  We  plan for Linq monitor implant.  Risks and benefits were discussed.  Risks include bleeding and infection.  The patient understands the risks and is agreed to the procedure.  Current medicines are reviewed at length with the patient today.   The patient does not have concerns regarding her medicines.  The following changes were made today:  none  Labs/ tests ordered today include:  Orders Placed This Encounter  Procedures  . Glucose, capillary  . Informed Consent Details: Transcribe to consent form and obtain patient signature  . EP PPM/ICD IMPLANT   Case discussed with primary cardiology  Disposition:   FU with   1 year  Signed,  Meredith Leeds, MD  02/28/2018 11:41 AM     [facility] [address] [place] [address] [phone] (office) [phone] (fax)

## 2018-03-04 ENCOUNTER — Ambulatory Visit (INDEPENDENT_AMBULATORY_CARE_PROVIDER_SITE_OTHER): Payer: Medicare Other | Admitting: Nurse Practitioner

## 2018-03-04 DIAGNOSIS — I480 Paroxysmal atrial fibrillation: Secondary | ICD-10-CM

## 2018-03-04 DIAGNOSIS — R55 Syncope and collapse: Secondary | ICD-10-CM

## 2018-03-04 NOTE — Progress Notes (Signed)
ILR wound check. Wound well healed. Monitor transmitting. Reviewed instructions for symptom activator. Follow up with Dr Curt Bears as scheduled

## 2018-03-07 DIAGNOSIS — E782 Mixed hyperlipidemia: Secondary | ICD-10-CM | POA: Diagnosis not present

## 2018-03-07 DIAGNOSIS — M791 Myalgia, unspecified site: Secondary | ICD-10-CM | POA: Diagnosis not present

## 2018-03-07 DIAGNOSIS — N184 Chronic kidney disease, stage 4 (severe): Secondary | ICD-10-CM | POA: Diagnosis not present

## 2018-03-07 DIAGNOSIS — E1122 Type 2 diabetes mellitus with diabetic chronic kidney disease: Secondary | ICD-10-CM | POA: Diagnosis not present

## 2018-03-21 ENCOUNTER — Encounter: Payer: Self-pay | Admitting: Gastroenterology

## 2018-03-21 ENCOUNTER — Ambulatory Visit: Payer: Medicare Other | Admitting: Gastroenterology

## 2018-03-21 VITALS — BP 128/70 | HR 59 | Ht 63.0 in | Wt 171.2 lb

## 2018-03-21 DIAGNOSIS — K922 Gastrointestinal hemorrhage, unspecified: Secondary | ICD-10-CM

## 2018-03-21 DIAGNOSIS — N189 Chronic kidney disease, unspecified: Secondary | ICD-10-CM | POA: Diagnosis not present

## 2018-03-21 NOTE — Patient Instructions (Signed)
If you are age 78 or older, your body mass index should be between 23-30. Your Body mass index is 30.34 kg/m. If this is out of the aforementioned range listed, please consider follow up with your Primary Care Provider.  If you are age 52 or younger, your body mass index should be between 19-25. Your Body mass index is 30.34 kg/m. If this is out of the aformentioned range listed, please consider follow up with your Primary Care Provider.    Thank you,  Dr. Jackquline Denmark

## 2018-03-21 NOTE — Progress Notes (Signed)
ASSESSMENT AND PLAN;   #1. Obscure GI Bleeding on plavix. Hb 11.0 (02/2018), 10.7 (08/24/2017), was 7.5 (06/21/2017), s/p iron infusion and blood transfusions in the past. S/P EGD 08/12/2016 by Dr. Dolphus Jenny showing small hiatal hernia, mild gastritis.  S/P colonoscopy 08/12/2016 (Dr Dolphus Jenny) showing moderate pancolonic diverticulosis, small ascending colonic tubular adenoma, ischemic colitis and mod internal hemorrhoids.  Colonoscopy 11/21/2014 showing moderate colonic diverticulosis. CE 10/2017 showing small erosion in the duodenum, negative for any active bleeding, subsequently neg EGD 12/2017. #2.  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  H/O Ischemic colitis July 2018.  Plan: -Trend CBC (pt have appt with Dr Raliegh Ip). -Continue protonix 40mg  po qd for now. -Continue benefiber qd. -FU in 12 weeks.    HPI:    Amanda [name] is a 78 y.o. female  Doing good Has had implantable loop recorder 02/23/2018 S/p Watchman's procedure (Dr Denman George) 11/2016 for A Fib Still on Plavix and aspirin CE 10/2017 showing small erosion in the duodenum, negative for any active bleeding, subsequently neg EGD 12/2017. No GI complaints. Denies having any nausea, vomiting, heartburn, regurgitation, diarrhea or constipation. No abdominal pain.  She also has chronic renal insufficiency with baseline creatinine of 1.11 July 2017.   Past Medical History:  Diagnosis Date  . Acute ischemic stroke ([place])   . Acute metabolic encephalopathy   . Acute on chronic systolic (congestive) heart failure ([place])   . Atrial fibrillation ([place])   . Chronic anemia   . Chronic kidney disease (CKD), stage III (moderate) (HCC)   . Diabetes mellitus without complication ([place])   . Diverticulosis   . Gastroesophageal reflux   . History of cardiac monitoring 02/23/2018   monitor inserted  . Hyperlipidemia   . Hypertensive heart disease with heart failure ([place])    . Myocardial infarction ([place])   . OSA on CPAP   . Renal failure, chronic, stage 3 (moderate) (HCC)   . Systolic heart failure ([place])   . Thyroid disease     Past Surgical History:  Procedure Laterality Date  . CARDIAC CATHETERIZATION    . COLONOSCOPY  11/21/2014   Moderate predominantly sigmoid diverticulosis. Otherwise noraml collonscopy to TI.   Marland Kitchen CORONARY ANGIOPLASTY WITH STENT PLACEMENT  08/2016  . EXCISIONAL HEMORRHOIDECTOMY    . LEFT ATRIAL APPENDAGE OCCLUSION  11/2016   in [place]  . LOOP RECORDER INSERTION N/A 02/23/2018   Procedure: LOOP RECORDER INSERTION;  Surgeon: Constance Haw, MD;  Location: [facility];  Service: Cardiovascular;  Laterality: N/A;  . NOSE SURGERY      Family History  Problem Relation Age of Onset  . Breast cancer Mother   . Hypertension Father   . Prostate cancer Father   . Stroke Father   . Diabetes Sister   . Heart attack Sister   . Heart attack Brother   . Heart attack Maternal Uncle   . Cancer Maternal Grandfather        not sure if it is colon or rectal   . Esophageal cancer Neg Hx     Social History   Tobacco Use  . Smoking status: Former Research scientist (life sciences)  . Smokeless tobacco: Never Used  Substance Use Topics  . Alcohol use: No  . Drug use: No    Current Outpatient Medications  Medication Sig Dispense Refill  . albuterol (PROVENTIL HFA;VENTOLIN HFA) 108 (90 Base) MCG/ACT inhaler Inhale 2 puffs into the  lungs every 6 (six) hours as needed for wheezing or shortness of breath.    Marland Kitchen aspirin EC 81 MG tablet Take 81 mg by mouth daily.    . Cholecalciferol (VITAMIN D3) 50 MCG (2000 UT) TABS Take 2,000 Units by mouth daily at 12 noon.    . clopidogrel (PLAVIX) 75 MG tablet Take 75 mg by mouth daily.    . Coenzyme Q10 100 MG TABS Take 100 mg by mouth daily at 12 noon.     . colchicine 0.6 MG tablet Take 0.6-1.2 mg by mouth daily as needed (for gout attack). 1.2mg  at onset of gout attack, then 0.6mg  an hour later. Next day 0.6mg   twice a day, then 0.6mg  daily until gout pain subsides.     Marland Kitchen estradiol (ESTRACE) 0.1 MG/GM vaginal cream Place 1 Applicatorful vaginally 3 (three) times a week.    . ezetimibe (ZETIA) 10 MG tablet Take 1 tablet (10 mg total) by mouth daily. Please schedule office visit (Patient taking differently: Take 10 mg by mouth every evening. Please schedule office visit) 90 tablet 2  . febuxostat (ULORIC) 40 MG tablet Take 40 mg by mouth every evening.     . ferrous sulfate 325 (65 FE) MG EC tablet Take 325 mg by mouth 2 (two) times daily.     . folic acid (FOLVITE) 119 MCG tablet Take 400 mcg by mouth daily at 12 noon.     . furosemide (LASIX) 20 MG tablet Take 20-40 mg by mouth See admin instructions. Take 2 tablets (40 mg) by mouth daily in the morning & 1 tablet (20 mg) by mouth at lunch    . Glucosamine-Chondroitin (OSTEO BI-FLEX REGULAR STRENGTH PO) Take 1 tablet by mouth daily at 12 noon.    . isosorbide mononitrate (IMDUR) 30 MG 24 hr tablet Take 1 tablet (30 mg total) by mouth daily. 90 tablet 1  . levothyroxine (SYNTHROID, LEVOTHROID) 50 MCG tablet Take 50 mcg by mouth daily before breakfast.    . Multiple Vitamins-Minerals (OCUVITE ADULT 50+ PO) Take 1 tablet by mouth daily at 12 noon.     . nitroGLYCERIN (NITROSTAT) 0.4 MG SL tablet Place 0.4 mg under the tongue every 5 (five) minutes as needed for chest pain.    Marland Kitchen omega-3 acid ethyl esters (LOVAZA) 1 g capsule Take 1 capsule (1 g total) by mouth daily at 12 noon. 90 capsule 1  . pantoprazole (PROTONIX) 40 MG tablet Take 1 tablet (40 mg total) by mouth daily. 90 tablet 1  . polyethylene glycol powder (GLYCOLAX/MIRALAX) powder Take 17 g by mouth every other day. Alternating between miralax and benefiber    . pravastatin (PRAVACHOL) 40 MG tablet Take 40 mg by mouth every evening.     . Probiotic Product (PRO-BIOTIC BLEND PO) Take 1 capsule by mouth daily at 12 noon.     . ranolazine (RANEXA) 1000 MG SR tablet Take 1 tablet (1,000 mg total) by mouth  2 (two) times daily. 180 tablet 1  . traMADol (ULTRAM) 50 MG tablet Take 50 mg by mouth every 12 (twelve) hours as needed for moderate pain.     . vitamin B-12 (CYANOCOBALAMIN) 1000 MCG tablet Take 1,000 mcg by mouth daily at 12 noon.    . Wheat Dextrin (BENEFIBER PO) Take 1 Dose by mouth every other day. Alternating between benefiber & miralax    . zolpidem (AMBIEN) 10 MG tablet Take 10 mg by mouth at bedtime as needed for sleep.     Current Facility-Administered  Medications  Medication Dose Route Frequency Provider Last Rate Last Dose  . 0.9 %  sodium chloride infusion  500 mL Intravenous Once Jackquline Denmark, MD        Allergies  Allergen Reactions  . Ciprofloxacin Other (See Comments)    Achilles tendon pain  . Statins Other (See Comments)    Joint pain    Review of Systems:  Negative except for HPI     Physical Exam:    BP 128/70   Pulse (!) 59   Ht 5\' 3"  (1.6 m)   Wt 171 lb 4 oz (77.7 kg)   BMI 30.34 kg/m  Filed Weights   03/21/18 1547  Weight: 171 lb 4 oz (77.7 kg)   Constitutional:  Well-developed, in no acute distress. Psychiatric: Normal mood and affect. Behavior is normal. HEENT: Pupils normal.  Conjunctivae are normal. No scleral icterus. Neck supple.  Cardiovascular: Normal rate, regular rhythm. No edema Pulmonary/chest: Effort normal and breath sounds normal. No wheezing, rales or rhonchi. Abdominal: Soft, nondistended. Nontender. Bowel sounds active throughout. There are no masses palpable. No hepatomegaly. Rectal:  defered Neurological: Alert and oriented to person place and time. Skin: Skin is warm and dry. No rashes noted.  Data Reviewed: I have personally reviewed following labs and imaging studies    Carmell Austria, MD 03/21/2018, 3:55 PM  Cc: Street, Sharon Mt, *

## 2018-03-28 ENCOUNTER — Ambulatory Visit (INDEPENDENT_AMBULATORY_CARE_PROVIDER_SITE_OTHER): Payer: Medicare Other | Admitting: *Deleted

## 2018-03-28 DIAGNOSIS — R55 Syncope and collapse: Secondary | ICD-10-CM | POA: Diagnosis not present

## 2018-03-28 DIAGNOSIS — I480 Paroxysmal atrial fibrillation: Secondary | ICD-10-CM

## 2018-03-29 ENCOUNTER — Encounter: Payer: Self-pay | Admitting: Cardiology

## 2018-03-29 ENCOUNTER — Ambulatory Visit (INDEPENDENT_AMBULATORY_CARE_PROVIDER_SITE_OTHER): Payer: Medicare Other | Admitting: Cardiology

## 2018-03-29 VITALS — BP 112/60 | HR 68 | Ht 63.0 in | Wt 169.4 lb

## 2018-03-29 DIAGNOSIS — I480 Paroxysmal atrial fibrillation: Secondary | ICD-10-CM | POA: Diagnosis not present

## 2018-03-29 DIAGNOSIS — R55 Syncope and collapse: Secondary | ICD-10-CM

## 2018-03-29 DIAGNOSIS — I2510 Atherosclerotic heart disease of native coronary artery without angina pectoris: Secondary | ICD-10-CM

## 2018-03-29 DIAGNOSIS — R072 Precordial pain: Secondary | ICD-10-CM | POA: Diagnosis not present

## 2018-03-29 LAB — CUP PACEART REMOTE DEVICE CHECK
Date Time Interrogation Session: 20200224184121
Implantable Pulse Generator Implant Date: 20200122

## 2018-03-29 NOTE — Progress Notes (Signed)
Cardiology Office Note:    Date:  03/29/2018   ID:  Amanda [name], DOB [birth year], MRN [id number]  PCP:  Street, Amanda Mt, MD  Cardiologist:  Jenne Campus, MD    Referring MD: Street, Amanda [name], *   Chief Complaint  Patient presents with  . Follow-up  Doing well  History of Present Illness:    Amanda [name] is a 78 y.o. female with paroxysmal atrial fibrillation, status post watchman device, coronary artery disease chronic kidney failure comes today to my office for follow-up overall doing well month ago she got implantable loop recorder inserted I just reviewed interrogation of the device which was done remotely yesterday there is no significant arrhythmia interestingly today she described episodes of dizziness she actually was making her bed ready have to lay down back in the bed because she was passing out.  Obviously tomorrow we will find out if she got any significant arrhythmia not otherwise doing well she is seen by GI specialist she was told to stop aspirin which is fine from my standpoint review.  Past Medical History:  Diagnosis Date  . Acute ischemic stroke ([place])   . Acute metabolic encephalopathy   . Acute on chronic systolic (congestive) heart failure ([place])   . Atrial fibrillation ([place])   . Chronic anemia   . Chronic kidney disease (CKD), stage III (moderate) (HCC)   . Diabetes mellitus without complication ([place])   . Diverticulosis   . Gastroesophageal reflux   . History of cardiac monitoring 02/23/2018   monitor inserted  . Hyperlipidemia   . Hypertensive heart disease with heart failure ([place])   . Myocardial infarction ([place])   . OSA on CPAP   . Renal failure, chronic, stage 3 (moderate) (HCC)   . Systolic heart failure ([place])   . Thyroid disease     Past Surgical History:  Procedure Laterality Date  . CARDIAC CATHETERIZATION    . COLONOSCOPY  11/21/2014   Moderate predominantly sigmoid diverticulosis. Otherwise noraml collonscopy to TI.   Marland Kitchen  CORONARY ANGIOPLASTY WITH STENT PLACEMENT  08/2016  . EXCISIONAL HEMORRHOIDECTOMY    . LEFT ATRIAL APPENDAGE OCCLUSION  11/2016   in [place]  . LOOP RECORDER INSERTION N/A 02/23/2018   Procedure: LOOP RECORDER INSERTION;  Surgeon: Constance Haw, MD;  Location: [facility];  Service: Cardiovascular;  Laterality: N/A;  . NOSE SURGERY      Current Medications: Current Meds  Medication Sig  . albuterol (PROVENTIL HFA;VENTOLIN HFA) 108 (90 Base) MCG/ACT inhaler Inhale 2 puffs into the lungs every 6 (six) hours as needed for wheezing or shortness of breath.  Marland Kitchen aspirin EC 81 MG tablet Take 81 mg by mouth daily.  . Biotin 5000 MCG CAPS Take 1 capsule by mouth daily.  . Cholecalciferol (VITAMIN D3) 50 MCG (2000 UT) TABS Take 2,000 Units by mouth daily at 12 noon.  . clopidogrel (PLAVIX) 75 MG tablet Take 75 mg by mouth daily.  . Coenzyme Q10 100 MG TABS Take 100 mg by mouth daily at 12 noon.   . colchicine 0.6 MG tablet Take 0.6-1.2 mg by mouth daily as needed (for gout attack). 1.2mg  at onset of gout attack, then 0.6mg  an hour later. Next day 0.6mg  twice a day, then 0.6mg  daily until gout pain subsides.   Marland Kitchen estradiol (ESTRACE) 0.1 MG/GM vaginal cream Place 1 Applicatorful vaginally 3 (three) times a week.  . ezetimibe (ZETIA) 10 MG tablet Take 1 tablet (10 mg total) by mouth daily. Please schedule  office visit (Patient taking differently: Take 10 mg by mouth every evening. Please schedule office visit)  . febuxostat (ULORIC) 40 MG tablet Take 40 mg by mouth every evening.   . ferrous sulfate 325 (65 FE) MG EC tablet Take 325 mg by mouth 2 (two) times daily.   . folic acid (FOLVITE) 026 MCG tablet Take 400 mcg by mouth daily at 12 noon.   . furosemide (LASIX) 20 MG tablet Take 20-40 mg by mouth See admin instructions. Take 2 tablets (40 mg) by mouth daily in the morning & 1 tablet (20 mg) by mouth at lunch  . Glucosamine-Chondroitin (OSTEO BI-FLEX REGULAR STRENGTH PO) Take 1 tablet  by mouth daily at 12 noon.  . isosorbide mononitrate (IMDUR) 30 MG 24 hr tablet Take 1 tablet (30 mg total) by mouth daily.  Marland Kitchen levothyroxine (SYNTHROID, LEVOTHROID) 50 MCG tablet Take 50 mcg by mouth daily before breakfast.  . Multiple Vitamins-Minerals (OCUVITE ADULT 50+ PO) Take 1 tablet by mouth daily at 12 noon.   . nitroGLYCERIN (NITROSTAT) 0.4 MG SL tablet Place 0.4 mg under the tongue every 5 (five) minutes as needed for chest pain.  Marland Kitchen omega-3 acid ethyl esters (LOVAZA) 1 g capsule Take 1 capsule (1 g total) by mouth daily at 12 noon.  . pantoprazole (PROTONIX) 40 MG tablet Take 1 tablet (40 mg total) by mouth daily.  . polyethylene glycol powder (GLYCOLAX/MIRALAX) powder Take 17 g by mouth every other day. Alternating between miralax and benefiber  . pravastatin (PRAVACHOL) 40 MG tablet Take 40 mg by mouth every evening.   . Probiotic Product (PRO-BIOTIC BLEND PO) Take 1 capsule by mouth daily at 12 noon.   . ranolazine (RANEXA) 1000 MG SR tablet Take 1 tablet (1,000 mg total) by mouth 2 (two) times daily.  . traMADol (ULTRAM) 50 MG tablet Take 50 mg by mouth every 12 (twelve) hours as needed for moderate pain.   . vitamin B-12 (CYANOCOBALAMIN) 1000 MCG tablet Take 1,000 mcg by mouth daily at 12 noon.  . Wheat Dextrin (BENEFIBER PO) Take 1 Dose by mouth every other day. Alternating between benefiber & miralax  . zolpidem (AMBIEN) 10 MG tablet Take 10 mg by mouth at bedtime as needed for sleep.   Current Facility-Administered Medications for the 03/29/18 encounter (Office Visit) with Park Liter, MD  Medication  . 0.9 %  sodium chloride infusion     Allergies:   Ciprofloxacin and Statins   Social History   Socioeconomic History  . Marital status: Married    Spouse name: Not on file  . Number of children: 2  . Years of education: Not on file  . Highest education level: Not on file  Occupational History  . Occupation: Retired   Scientific laboratory technician  . Financial resource strain:  Not on file  . Food insecurity:    Worry: Not on file    Inability: Not on file  . Transportation needs:    Medical: Not on file    Non-medical: Not on file  Tobacco Use  . Smoking status: Former Research scientist (life sciences)  . Smokeless tobacco: Never Used  Substance and Sexual Activity  . Alcohol use: No  . Drug use: No  . Sexual activity: Not on file  Lifestyle  . Physical activity:    Days per week: Not on file    Minutes per session: Not on file  . Stress: Not on file  Relationships  . Social connections:    Talks on phone: Not on  file    Gets together: Not on file    Attends religious service: Not on file    Active member of club or organization: Not on file    Attends meetings of clubs or organizations: Not on file    Relationship status: Not on file  Other Topics Concern  . Not on file  Social History Narrative  . Not on file     Family History: The patient's family history includes Breast cancer in her mother; Cancer in her maternal grandfather; Diabetes in her sister; Heart attack in her brother, maternal uncle, and sister; Hypertension in her father; Prostate cancer in her father; Stroke in her father. There is no history of Esophageal cancer. ROS:   Please see the history of present illness.    All 14 point review of systems negative except as described per history of present illness  EKGs/Labs/Other Studies Reviewed:      Recent Labs: 06/24/2017: Hemoglobin 7.3; Platelets 451  Recent Lipid Panel No results found for: CHOL, TRIG, HDL, CHOLHDL, VLDL, LDLCALC, LDLDIRECT  Physical Exam:    VS:  BP 112/60   Pulse 68   Ht 5\' 3"  (1.6 m)   Wt 169 lb 6.4 oz (76.8 kg)   SpO2 96%   BMI 30.01 kg/m     Wt Readings from Last 3 Encounters:  03/29/18 169 lb 6.4 oz (76.8 kg)  03/21/18 171 lb 4 oz (77.7 kg)  01/05/18 171 lb 12.8 oz (77.9 kg)     GEN:  Well nourished, well developed in no acute distress HEENT: Normal NECK: No JVD; No carotid bruits LYMPHATICS: No  lymphadenopathy CARDIAC: RRR, no murmurs, no rubs, no gallops RESPIRATORY:  Clear to auscultation without rales, wheezing or rhonchi  ABDOMEN: Soft, non-tender, non-distended MUSCULOSKELETAL:  No edema; No deformity  SKIN: Warm and dry LOWER EXTREMITIES: no swelling NEUROLOGIC:  Alert and oriented x 3 PSYCHIATRIC:  Normal affect   ASSESSMENT:    1. Syncope, unspecified syncope type   2. Coronary artery disease involving native coronary artery of native heart without angina pectoris   3. Paroxysmal atrial fibrillation (HCC)   4. Precordial pain    PLAN:    In order of problems listed above:  1. Syncope.  Still unclear etiology she does have implantable loop recorder we will see what it shows. 2. Coronary disease stable on appropriate medication doing well from that point review 3. Atrial fibrillation.  Not anticoagulated because of GI bleeding does have a watchman device. 4. Precordial chest pain she described usually happening once a year in the middle of the night she can be awakened in the middle of night with tightness in the chest does not remember when she had her last time. 5. She is talking more seriously about potentially having hip replacement surgery.  Will require most likely stress test before procedure if she decided to pursue it.   Medication Adjustments/Labs and Tests Ordered: Current medicines are reviewed at length with the patient today.  Concerns regarding medicines are outlined above.  No orders of the defined types were placed in this encounter.  Medication changes: No orders of the defined types were placed in this encounter.   Signed, Park Liter, MD, [facility] 03/29/2018 1:51 PM    [place]

## 2018-03-29 NOTE — Patient Instructions (Signed)
Medication Instructions:  Your physician recommends that you continue on your current medications as directed. Please refer to the Current Medication list given to you today.  If you need a refill on your cardiac medications before your next appointment, please call your pharmacy.   Lab work: None.  If you have labs (blood work) drawn today and your tests are completely normal, you will receive your results only by: Marland Kitchen MyChart Message (if you have MyChart) OR . A paper copy in the mail If you have any lab test that is abnormal or we need to change your treatment, we will call you to review the results.  Testing/Procedures: None.   Follow-Up: At [facility], you and your health needs are our priority.  As part of our continuing mission to provide you with exceptional heart care, we have created designated Provider Care Teams.  These Care Teams include your primary Cardiologist (physician) and Advanced Practice Providers (APPs -  Physician Assistants and Nurse Practitioners) who all work together to provide you with the care you need, when you need it. You will need a follow up appointment in 5 months.  Please call our office 2 months in advance to schedule this appointment.  You may see Jenne Campus, MD or another member of our [place] Provider Team in [place]: Shirlee More, MD . Jyl Heinz, MD  Any Other Special Instructions Will Be Listed Below (If Applicable).

## 2018-04-04 NOTE — Progress Notes (Signed)
Carelink Summary Report / Loop Recorder 

## 2018-04-07 DIAGNOSIS — E669 Obesity, unspecified: Secondary | ICD-10-CM | POA: Diagnosis not present

## 2018-04-07 DIAGNOSIS — R7303 Prediabetes: Secondary | ICD-10-CM | POA: Diagnosis not present

## 2018-04-07 DIAGNOSIS — Z6830 Body mass index (BMI) 30.0-30.9, adult: Secondary | ICD-10-CM | POA: Diagnosis not present

## 2018-04-08 DIAGNOSIS — N184 Chronic kidney disease, stage 4 (severe): Secondary | ICD-10-CM | POA: Diagnosis not present

## 2018-04-08 DIAGNOSIS — Z1231 Encounter for screening mammogram for malignant neoplasm of breast: Secondary | ICD-10-CM | POA: Diagnosis not present

## 2018-04-14 DIAGNOSIS — D631 Anemia in chronic kidney disease: Secondary | ICD-10-CM | POA: Diagnosis not present

## 2018-04-14 DIAGNOSIS — N184 Chronic kidney disease, stage 4 (severe): Secondary | ICD-10-CM | POA: Diagnosis not present

## 2018-04-14 DIAGNOSIS — I130 Hypertensive heart and chronic kidney disease with heart failure and stage 1 through stage 4 chronic kidney disease, or unspecified chronic kidney disease: Secondary | ICD-10-CM | POA: Diagnosis not present

## 2018-04-14 DIAGNOSIS — I5020 Unspecified systolic (congestive) heart failure: Secondary | ICD-10-CM | POA: Diagnosis not present

## 2018-04-18 ENCOUNTER — Telehealth: Payer: Self-pay | Admitting: *Deleted

## 2018-04-18 MED ORDER — RANOLAZINE ER 1000 MG PO TB12: 1000.0000 mg | ORAL_TABLET | Freq: Two times a day (BID) | ORAL | 1 refills | Status: DC

## 2018-04-18 NOTE — Telephone Encounter (Signed)
*  STAT* If patient is at the pharmacy, call can be transferred to refill team.   1. Which medications need to be refilled? (please list name of each medication and dose if known) Ranolazine 1000 mg ER bid  2. Which pharmacy/location (including street and city if local pharmacy) is medication to be sent to?[facility]  3. Do they need a 30 day or 90 day supply? [place]

## 2018-04-21 DIAGNOSIS — R509 Fever, unspecified: Secondary | ICD-10-CM | POA: Diagnosis not present

## 2018-05-02 ENCOUNTER — Other Ambulatory Visit: Payer: Self-pay

## 2018-05-02 ENCOUNTER — Ambulatory Visit (INDEPENDENT_AMBULATORY_CARE_PROVIDER_SITE_OTHER): Payer: Medicare Other | Admitting: *Deleted

## 2018-05-02 DIAGNOSIS — R55 Syncope and collapse: Secondary | ICD-10-CM | POA: Diagnosis not present

## 2018-05-02 LAB — CUP PACEART REMOTE DEVICE CHECK
Date Time Interrogation Session: 20200328201052
Implantable Pulse Generator Implant Date: 20200122

## 2018-05-09 NOTE — Progress Notes (Signed)
Carelink Summary Report / Loop Recorder 

## 2018-05-23 ENCOUNTER — Telehealth: Payer: Self-pay | Admitting: *Deleted

## 2018-05-23 NOTE — Telephone Encounter (Signed)
Virtual Visit Pre-Appointment Phone Call  Steps For Call:  1. Confirm consent - "In the setting of the current Covid19 crisis, you are scheduled for a (phone or video) visit with your provider on (date) at (time).  Just as we do with many in-office visits, in order for you to participate in this visit, we must obtain consent.  If you'd like, I can send this to your mychart (if signed up) or email for you to review.  Otherwise, I can obtain your verbal consent now.  All virtual visits are billed to your insurance company just like a normal visit would be.  By agreeing to a virtual visit, we'd like you to understand that the technology does not allow for your provider to perform an examination, and thus may limit your provider's ability to fully assess your condition. If your provider identifies any concerns that need to be evaluated in person, we will make arrangements to do so.  Finally, though the technology is pretty good, we cannot assure that it will always work on either your or our end, and in the setting of a video visit, we may have to convert it to a phone-only visit.  In either situation, we cannot ensure that we have a secure connection.  Are you willing to proceed?" STAFF: Did the patient verbally acknowledge consent to telehealth visit? Document YES/NO here: YES  2. Confirm the BEST phone number to call the day of the visit by including in appointment notes  3. Give patient instructions for MyChart download to smartphone OR Doximity/Doxy.me as below if video visit (depending on what platform provider is using)  4. Confirm that appointment type is correct in Epic appointment notes (VIDEO vs PHONE)  5. Advise patient to be prepared with their blood pressure, heart rate, weight, any heart rhythm information, their current medicines, and a piece of paper and pen handy for any instructions they may receive the day of their visit  6. Inform patient they will receive a phone call 15 minutes  prior to their appointment time (may be from unknown caller ID) so they should be prepared to answer    TELEPHONE CALL NOTE  Amanda [name] has been deemed a candidate for a follow-up tele-health visit to limit community exposure during the Covid-19 pandemic. I spoke with the patient via phone to ensure availability of phone/video source, confirm preferred email & phone number, and discuss instructions and expectations.  I reminded Amanda [name] to be prepared with any vital sign and/or heart rhythm information that could potentially be obtained via home monitoring, at the time of her visit. I reminded Amanda [name] to expect a phone call prior to her visit.  Amanda [name] 05/23/2018 3:53 PM   INSTRUCTIONS FOR DOWNLOADING THE MYCHART APP TO SMARTPHONE  - The patient must first make sure to have activated MyChart and know their login information - If Apple, go to [employer] and type in MyChart in the search bar and download the app. If Android, ask patient to go to [employer] and type in [place] in the search bar and download the app. The app is free but as with any other app downloads, their phone may require them to verify saved payment information or Apple/Android password.  - The patient will need to then log into the app with their MyChart username and password, and select [facility] as their healthcare provider to link the account. When it is time for your visit, go to the [facility]  app, find appointments, and click Begin Video Visit. Be sure to Select Allow for your device to access the Microphone and Camera for your visit. You will then be connected, and your provider will be with you shortly.  **If they have any issues connecting, or need assistance please contact MyChart service desk [phone])**  **If using a computer, in order to ensure the best quality for their visit they will need to use either of the following Internet Browsers: [employer], or Google  Chrome**  IF USING DOXIMITY or DOXY.ME - The patient will receive a link just prior to their visit by text.     FULL LENGTH CONSENT FOR TELE-HEALTH VISIT   I hereby voluntarily request, consent and authorize [facility] and its employed or contracted physicians, physician assistants, nurse practitioners or other licensed health care professionals (the Practitioner), to provide me with telemedicine health care services (the "Services") as deemed necessary by the treating Practitioner. I acknowledge and consent to receive the Services by the Practitioner via telemedicine. I understand that the telemedicine visit will involve communicating with the Practitioner through live audiovisual communication technology and the disclosure of certain medical information by electronic transmission. I acknowledge that I have been given the opportunity to request an in-person assessment or other available alternative prior to the telemedicine visit and am voluntarily participating in the telemedicine visit.  I understand that I have the right to withhold or withdraw my consent to the use of telemedicine in the course of my care at any time, without affecting my right to future care or treatment, and that the Practitioner or I may terminate the telemedicine visit at any time. I understand that I have the right to inspect all information obtained and/or recorded in the course of the telemedicine visit and may receive copies of available information for a reasonable fee.  I understand that some of the potential risks of receiving the Services via telemedicine include:  Marland Kitchen Delay or interruption in medical evaluation due to technological equipment failure or disruption; . Information transmitted may not be sufficient (e.g. poor resolution of images) to allow for appropriate medical decision making by the Practitioner; and/or  . In rare instances, security protocols could fail, causing a breach of personal health  information.  Furthermore, I acknowledge that it is my responsibility to provide information about my medical history, conditions and care that is complete and accurate to the best of my ability. I acknowledge that Practitioner's advice, recommendations, and/or decision may be based on factors not within their control, such as incomplete or inaccurate data provided by me or distortions of diagnostic images or specimens that may result from electronic transmissions. I understand that the practice of medicine is not an exact science and that Practitioner makes no warranties or guarantees regarding treatment outcomes. I acknowledge that I will receive a copy of this consent concurrently upon execution via email to the email address I last provided but may also request a printed copy by calling the office of [facility].    I understand that my insurance will be billed for this visit.   I have read or had this consent read to me. . I understand the contents of this consent, which adequately explains the benefits and risks of the Services being provided via telemedicine.  . I have been provided ample opportunity to ask questions regarding this consent and the Services and have had my questions answered to my satisfaction. . I give my informed consent for the services  to be provided through the use of telemedicine in my medical care  By participating in this telemedicine visit I agree to the above. Verbal Consent Given

## 2018-05-26 ENCOUNTER — Other Ambulatory Visit: Payer: Self-pay

## 2018-05-26 MED ORDER — PANTOPRAZOLE SODIUM 40 MG PO TBEC: 40.0000 mg | DELAYED_RELEASE_TABLET | Freq: Every day | ORAL | 2 refills | Status: DC

## 2018-05-30 ENCOUNTER — Other Ambulatory Visit: Payer: Self-pay

## 2018-05-30 ENCOUNTER — Telehealth (INDEPENDENT_AMBULATORY_CARE_PROVIDER_SITE_OTHER): Payer: Medicare Other | Admitting: Cardiology

## 2018-05-30 ENCOUNTER — Encounter: Payer: Self-pay | Admitting: Cardiology

## 2018-05-30 DIAGNOSIS — I480 Paroxysmal atrial fibrillation: Secondary | ICD-10-CM | POA: Diagnosis not present

## 2018-05-30 NOTE — Progress Notes (Signed)
Electrophysiology TeleHealth Note   Due to national recommendations of social distancing due to COVID 19, an audio/video telehealth visit is felt to be most appropriate for this patient at this time.  See Epic message for the patient's consent to telehealth for [facility].   Date:  05/30/2018   ID:  [name], DOB [birth year], MRN [id number]  Location: patient's home  Provider location: [address] [place]  Evaluation Performed: Follow-up visit  PCP:  Street, Sharon Mt, MD  Cardiologist:  Jenne Campus, MD  Electrophysiologist:  Dr Curt Bears  Chief Complaint:  AF  History of Present Illness:    Richie Vadala is a 78 y.o. female who presents via audio/video conferencing for a telehealth visit today.  Since last being seen in our clinic, the patient reports doing very well.  Today, she denies symptoms of palpitations, chest pain, shortness of breath,  lower extremity edema, dizziness, presyncope, or syncope.  The patient is otherwise without complaint today.  The patient denies symptoms of fevers, chills, cough, or new SOB worrisome for COVID 19.  She has a history significant for ischemic stroke, atrial fibrillation, stage III CKD, diabetes, hypertension, OSA on CPAP.  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  She had an episode of syncope and thus had a Linq monitor implanted 02/23/2018.  Today, denies symptoms of palpitations, chest pain, shortness of breath, orthopnea, PND, lower extremity edema, claudication, dizziness, presyncope, syncope, bleeding, or neurologic sequela. The patient is tolerating medications without difficulties.  Currently she is feeling well.  She has no chest pain or shortness of breath.  She has not had any further episodes of syncope.  She did have 2 symptom episodes on most recent Linq monitoring, but those were both associated with sinus rhythm.  She does feel that she has done better since her Linq monitor was  implanted.  Past Medical History:  Diagnosis Date   Acute ischemic stroke ([facility])    Acute metabolic encephalopathy    Acute on chronic systolic (congestive) heart failure (HCC)    Atrial fibrillation (HCC)    Chronic anemia    Chronic kidney disease (CKD), stage III (moderate) (HCC)    Diabetes mellitus without complication (HCC)    Diverticulosis    Gastroesophageal reflux    History of cardiac monitoring 02/23/2018   monitor inserted   Hyperlipidemia    Hypertensive heart disease with heart failure (HCC)    Myocardial infarction (HCC)    OSA on CPAP    Renal failure, chronic, stage 3 (moderate) (HCC)    Systolic heart failure (HCC)    Thyroid disease     Past Surgical History:  Procedure Laterality Date   CARDIAC CATHETERIZATION     COLONOSCOPY  11/21/2014   Moderate predominantly sigmoid diverticulosis. Otherwise noraml collonscopy to TI.    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     aspirin EC 81  MG tablet Take 81 mg by mouth daily.     Biotin 5000 MCG CAPS Take 1 capsule by mouth daily.     Cholecalciferol (VITAMIN D3) 50 MCG (2000 UT) TABS Take 2,000 Units by mouth daily at 12 noon.     clopidogrel (PLAVIX) 75 MG tablet Take 75 mg by mouth daily.     Coenzyme Q10 100 MG TABS Take 100 mg by mouth daily at 12 noon.      colchicine 0.6 MG tablet Take 0.6-1.2 mg by mouth daily as needed (for gout attack).  1.2mg  at onset of gout attack, then 0.6mg  an hour later. Next day 0.6mg  twice a day, then 0.6mg  daily until gout pain subsides.      estradiol (ESTRACE) 0.1 MG/GM vaginal cream Place 1 Applicatorful vaginally 3 (three) times a week.     ezetimibe (ZETIA) 10 MG tablet Take 1 tablet (10 mg total) by mouth daily. Please schedule office visit (Patient taking differently: Take 10 mg by mouth every evening. Please schedule office visit) 90 tablet 2   febuxostat (ULORIC) 40 MG tablet Take 40 mg by mouth every evening.      ferrous sulfate 325 (65 FE) MG EC tablet Take 325 mg by mouth 2 (two) times daily.      folic acid (FOLVITE) 509 MCG tablet Take 400 mcg by mouth daily at 12 noon.      furosemide (LASIX) 20 MG tablet Take 20-40 mg by mouth See admin instructions. Take 2 tablets (40 mg) by mouth daily in the morning & 1 tablet (20 mg) by mouth at lunch     Glucosamine-Chondroitin (OSTEO BI-FLEX REGULAR STRENGTH PO) Take 1 tablet by mouth daily at 12 noon.     isosorbide mononitrate (IMDUR) 30 MG 24 hr tablet Take 1 tablet (30 mg total) by mouth daily. 90 tablet 1   levothyroxine (SYNTHROID, LEVOTHROID) 50 MCG tablet Take 50 mcg by mouth daily before breakfast.     Multiple Vitamins-Minerals (OCUVITE ADULT 50+ PO) Take 1 tablet by mouth daily at 12 noon.      nitroGLYCERIN (NITROSTAT) 0.4 MG SL tablet Place 0.4 mg under the tongue every 5 (five) minutes as needed for chest pain.     omega-3 acid ethyl esters (LOVAZA) 1 g capsule Take 1 capsule (1 g total) by mouth daily at 12 noon. 90 capsule 1   pantoprazole (PROTONIX) 40 MG tablet Take 1 tablet (40 mg total) by mouth daily. 90 tablet 2   polyethylene glycol powder (GLYCOLAX/MIRALAX) powder Take 17 g by mouth every other day. Alternating between miralax and benefiber     pravastatin (PRAVACHOL) 40 MG tablet Take 40 mg by mouth every evening.      Probiotic Product (PRO-BIOTIC BLEND PO) Take 1 capsule by mouth daily at 12 noon.       ranolazine (RANEXA) 1000 MG SR tablet Take 1 tablet (1,000 mg total) by mouth 2 (two) times daily. 180 tablet 1   traMADol (ULTRAM) 50 MG tablet Take 50 mg by mouth every 12 (twelve) hours as needed for moderate pain.      vitamin B-12 (CYANOCOBALAMIN) 1000 MCG tablet Take 1,000 mcg by mouth daily at 12 noon.     Wheat Dextrin (BENEFIBER PO) Take 1 Dose by mouth every other day. Alternating between benefiber & miralax     zolpidem (AMBIEN) 10 MG tablet Take 10 mg by mouth at bedtime as needed for sleep.     Current Facility-Administered Medications  Medication Dose Route Frequency  Provider Last Rate Last Dose   0.9 %  sodium chloride infusion  500 mL Intravenous Once Jackquline Denmark, MD        Allergies:   Ciprofloxacin and Statins   Social History:  The patient  reports that she has quit smoking. She has never used smokeless tobacco. She reports that she does not drink alcohol or use drugs.   Family History:  The patient's  family history includes Breast cancer in her mother; Cancer in her maternal grandfather; Diabetes in her sister; Heart attack in her brother, maternal uncle, and sister; Hypertension in her father; Prostate cancer in her father; Stroke in her father.   ROS:  Please see the history of present illness.   All other systems are personally reviewed and negative.    Exam:    Vital Signs:  There were no vitals taken for this visit.  Well appearing, alert and conversant, regular work of breathing,  good skin color Eyes- anicteric, neuro- grossly intact, skin- no apparent rash or lesions or cyanosis, mouth- oral mucosa is pink   Labs/Other Tests and Data Reviewed:    Recent Labs: 06/24/2017: Hemoglobin 7.3; Platelets 451   Wt Readings from Last 3 Encounters:  03/29/18 169 lb 6.4 oz (76.8 kg)  03/21/18 171 lb 4 oz (77.7 kg)  01/05/18 171 lb 12.8 oz (77.9 kg)     Other studies personally reviewed: Additional studies/ records that were reviewed today include: ECG  01/05/2018 personally reviewed  Review of the above records today demonstrates: Sinus rhythm, rate 61  Last device remote is reviewed from [facility] PDF dated 05/02/2018 which reveals normal device function, no arrhythmias    ASSESSMENT & PLAN:    1.  Paroxysmal atrial fibrillation: Status post watchman currently not anticoagulated.  Minimal atrial fibrillation noted on link monitoring.  No changes.  This patients CHA2DS2-VASc Score and unadjusted Ischemic Stroke Rate (% per year) is equal to 11.2 % stroke rate/year from a score of 7  Above score calculated as 1 point each if present [CHF, HTN, DM, Vascular=MI/PAD/Aortic Plaque, Age if 65-74, or Female] Above score calculated as 2 points each if present [Age > 75, or Stroke/TIA/TE]  2.  OSA: CPAP compliance encouraged  3.  Hypertension: Currently well controlled.  No changes.  4.  Syncope: No arrhythmias seen on most recent Linq monitor interrogation.  We Sean Malinowski continue to monitor.   COVID 19 screen The patient denies symptoms of COVID 19 at this time.  The importance of social distancing was discussed today.  Follow-up: 1 year Next remote: 06/03/18  Current medicines are reviewed at length with the patient today.   The patient does not have concerns regarding her medicines.  The following changes were made today:  none  Labs/ tests ordered today include:  No orders of the defined types were placed in this encounter.    Patient Risk:  after full review of this patients clinical status, I feel that they are at moderate risk at this time.  Today, I have spent 15 minutes with the patient with telehealth technology discussing atrial fibrillation, syncope, Linq monitor.    Signed, Mann Skaggs Meredith Leeds, MD  05/30/2018 10:37 AM     [place] [address] [place] [address] [phone] (office) [phone] (fax)

## 2018-06-03 ENCOUNTER — Ambulatory Visit (INDEPENDENT_AMBULATORY_CARE_PROVIDER_SITE_OTHER): Payer: Medicare Other | Admitting: *Deleted

## 2018-06-03 ENCOUNTER — Other Ambulatory Visit: Payer: Self-pay

## 2018-06-03 DIAGNOSIS — R55 Syncope and collapse: Secondary | ICD-10-CM

## 2018-06-03 LAB — CUP PACEART REMOTE DEVICE CHECK
Date Time Interrogation Session: 20200501094031
Implantable Pulse Generator Implant Date: 20200122

## 2018-06-08 NOTE — Progress Notes (Signed)
Carelink Summary Report / Loop Recorder 

## 2018-06-15 DIAGNOSIS — N184 Chronic kidney disease, stage 4 (severe): Secondary | ICD-10-CM | POA: Diagnosis not present

## 2018-06-16 DIAGNOSIS — I5020 Unspecified systolic (congestive) heart failure: Secondary | ICD-10-CM | POA: Diagnosis not present

## 2018-06-16 DIAGNOSIS — I130 Hypertensive heart and chronic kidney disease with heart failure and stage 1 through stage 4 chronic kidney disease, or unspecified chronic kidney disease: Secondary | ICD-10-CM | POA: Diagnosis not present

## 2018-06-16 DIAGNOSIS — N184 Chronic kidney disease, stage 4 (severe): Secondary | ICD-10-CM | POA: Diagnosis not present

## 2018-06-16 DIAGNOSIS — E1122 Type 2 diabetes mellitus with diabetic chronic kidney disease: Secondary | ICD-10-CM | POA: Diagnosis not present

## 2018-07-06 ENCOUNTER — Ambulatory Visit (INDEPENDENT_AMBULATORY_CARE_PROVIDER_SITE_OTHER): Payer: Medicare Other | Admitting: *Deleted

## 2018-07-06 DIAGNOSIS — R55 Syncope and collapse: Secondary | ICD-10-CM | POA: Diagnosis not present

## 2018-07-06 LAB — CUP PACEART REMOTE DEVICE CHECK
Date Time Interrogation Session: 20200603121004
Implantable Pulse Generator Implant Date: 20200122

## 2018-07-14 NOTE — Progress Notes (Signed)
Carelink Summary Report / Loop Recorder 

## 2018-07-18 ENCOUNTER — Other Ambulatory Visit: Payer: Self-pay | Admitting: Cardiology

## 2018-08-08 ENCOUNTER — Ambulatory Visit (INDEPENDENT_AMBULATORY_CARE_PROVIDER_SITE_OTHER): Payer: Medicare Other | Admitting: *Deleted

## 2018-08-08 DIAGNOSIS — R55 Syncope and collapse: Secondary | ICD-10-CM

## 2018-08-08 LAB — CUP PACEART REMOTE DEVICE CHECK
Date Time Interrogation Session: 20200706124159
Implantable Pulse Generator Implant Date: 20200122

## 2018-08-15 NOTE — Progress Notes (Signed)
Carelink Summary Report / Loop Recorder 

## 2018-08-29 ENCOUNTER — Ambulatory Visit: Payer: Medicare Other | Admitting: Cardiology

## 2018-09-06 ENCOUNTER — Encounter: Payer: Self-pay | Admitting: Cardiology

## 2018-09-06 ENCOUNTER — Other Ambulatory Visit: Payer: Self-pay

## 2018-09-06 ENCOUNTER — Ambulatory Visit (INDEPENDENT_AMBULATORY_CARE_PROVIDER_SITE_OTHER): Payer: Medicare Other | Admitting: Cardiology

## 2018-09-06 VITALS — BP 144/60 | HR 63 | Ht 63.0 in | Wt 174.0 lb

## 2018-09-06 DIAGNOSIS — I480 Paroxysmal atrial fibrillation: Secondary | ICD-10-CM

## 2018-09-06 DIAGNOSIS — I2510 Atherosclerotic heart disease of native coronary artery without angina pectoris: Secondary | ICD-10-CM

## 2018-09-06 DIAGNOSIS — R55 Syncope and collapse: Secondary | ICD-10-CM | POA: Diagnosis not present

## 2018-09-06 DIAGNOSIS — Z95818 Presence of other cardiac implants and grafts: Secondary | ICD-10-CM

## 2018-09-06 DIAGNOSIS — E785 Hyperlipidemia, unspecified: Secondary | ICD-10-CM | POA: Diagnosis not present

## 2018-09-06 NOTE — Progress Notes (Signed)
Cardiology Office Note:    Date:  09/06/2018   ID:  Amanda [name], DOB 1940/11/16, MRN [id number]  PCP:  Street, Sharon Mt, MD  Cardiologist:  Jenne Campus, MD    Referring MD: Street, Sharon Mt, *   Chief Complaint  Patient presents with  . Follow-up  Doing well  History of Present Illness:    Amanda [name] is a 78 y.o. female complex past medical history which include paroxysmal atrial fibrillation, chronic GI bleed that required watchman device implantation.  Also history of unexplained syncope.  She does have implantable loop recorder by [employer].  So far we were not able to pick up any arrhythmia.  She comes to my office for follow-up overall she seems to be doing well actually she looks quite good today.  Described to have chronic pain in her hip.  She is talking again about potentially doing hip surgery but of course is concerned about her overall health.  Past Medical History:  Diagnosis Date  . Acute ischemic stroke ([place])   . Acute metabolic encephalopathy   . Acute on chronic systolic (congestive) heart failure ([place])   . Atrial fibrillation ([place])   . Chronic anemia   . Chronic kidney disease (CKD), stage III (moderate) (HCC)   . Diabetes mellitus without complication ([place])   . Diverticulosis   . Gastroesophageal reflux   . History of cardiac monitoring 02/23/2018   monitor inserted  . Hyperlipidemia   . Hypertensive heart disease with heart failure ([place])   . Myocardial infarction ([place])   . OSA on CPAP   . Renal failure, chronic, stage 3 (moderate) (HCC)   . Systolic heart failure ([place])   . Thyroid disease     Past Surgical History:  Procedure Laterality Date  . CARDIAC CATHETERIZATION    . COLONOSCOPY  11/21/2014   Moderate predominantly sigmoid diverticulosis. Otherwise noraml collonscopy to TI.   Marland Kitchen CORONARY ANGIOPLASTY WITH STENT PLACEMENT  08/2016  . EXCISIONAL HEMORRHOIDECTOMY    . LEFT ATRIAL APPENDAGE OCCLUSION  11/2016   in [place]   . LOOP RECORDER INSERTION N/A 02/23/2018   Procedure: LOOP RECORDER INSERTION;  Surgeon: Constance Haw, MD;  Location: [facility];  Service: Cardiovascular;  Laterality: N/A;  . NOSE SURGERY      Current Medications: Current Meds  Medication Sig  . albuterol (PROVENTIL HFA;VENTOLIN HFA) 108 (90 Base) MCG/ACT inhaler Inhale 2 puffs into the lungs every 6 (six) hours as needed for wheezing or shortness of breath.  . Biotin 5000 MCG CAPS Take 1 capsule by mouth daily.  . Cholecalciferol (VITAMIN D3) 50 MCG (2000 UT) TABS Take 2,000 Units by mouth daily at 12 noon.  . clopidogrel (PLAVIX) 75 MG tablet Take 75 mg by mouth daily.  . Coenzyme Q10 100 MG TABS Take 100 mg by mouth daily at 12 noon.   . colchicine 0.6 MG tablet Take 0.6-1.2 mg by mouth daily as needed (for gout attack). 1.2mg  at onset of gout attack, then 0.6mg  an hour later. Next day 0.6mg  twice a day, then 0.6mg  daily until gout pain subsides.   Marland Kitchen estradiol (ESTRACE) 0.1 MG/GM vaginal cream Place 1 Applicatorful vaginally 3 (three) times a week.  . ezetimibe (ZETIA) 10 MG tablet Take 1 tablet (10 mg total) by mouth daily. Please schedule office visit (Patient taking differently: Take 10 mg by mouth every evening. Please schedule office visit)  . febuxostat (ULORIC) 40 MG tablet Take 40 mg by mouth every evening.   Marland Kitchen  ferrous sulfate 325 (65 FE) MG EC tablet Take 325 mg by mouth 2 (two) times daily.   . folic acid (FOLVITE) 294 MCG tablet Take 400 mcg by mouth daily at 12 noon.   . furosemide (LASIX) 20 MG tablet Take 20-40 mg by mouth See admin instructions. Take 2 tablets (40 mg) by mouth daily in the morning & 1 tablet (20 mg) by mouth at lunch  . Glucosamine-Chondroitin (OSTEO BI-FLEX REGULAR STRENGTH PO) Take 1 tablet by mouth daily at 12 noon.  . isosorbide mononitrate (IMDUR) 30 MG 24 hr tablet TAKE 1 TABLET BY MOUTH DAILY.  Marland Kitchen levothyroxine (SYNTHROID, LEVOTHROID) 50 MCG tablet Take 50 mcg by mouth daily before  breakfast.  . Multiple Vitamins-Minerals (OCUVITE ADULT 50+ PO) Take 1 tablet by mouth daily at 12 noon.   . nitroGLYCERIN (NITROSTAT) 0.4 MG SL tablet Place 0.4 mg under the tongue every 5 (five) minutes as needed for chest pain.  Marland Kitchen omega-3 acid ethyl esters (LOVAZA) 1 g capsule TAKE 1 CAPSULE BY MOUTH DAILY AT 12 NOON.  . pantoprazole (PROTONIX) 40 MG tablet Take 1 tablet (40 mg total) by mouth daily.  . polyethylene glycol powder (GLYCOLAX/MIRALAX) powder Take 17 g by mouth every other day. Alternating between miralax and benefiber  . pravastatin (PRAVACHOL) 40 MG tablet Take 40 mg by mouth every evening.   . Probiotic Product (PRO-BIOTIC BLEND PO) Take 1 capsule by mouth daily at 12 noon.   . ranolazine (RANEXA) 1000 MG SR tablet Take 1 tablet (1,000 mg total) by mouth 2 (two) times daily.  . traMADol (ULTRAM) 50 MG tablet Take 50 mg by mouth every 12 (twelve) hours as needed for moderate pain.   . vitamin B-12 (CYANOCOBALAMIN) 1000 MCG tablet Take 1,000 mcg by mouth daily at 12 noon.  . Wheat Dextrin (BENEFIBER PO) Take 1 Dose by mouth every other day. Alternating between benefiber & miralax  . zolpidem (AMBIEN) 10 MG tablet Take 10 mg by mouth at bedtime as needed for sleep.   Current Facility-Administered Medications for the 09/06/18 encounter (Office Visit) with Park Liter, MD  Medication  . 0.9 %  sodium chloride infusion     Allergies:   Ciprofloxacin and Statins   Social History   Socioeconomic History  . Marital status: Married    Spouse name: Not on file  . Number of children: 2  . Years of education: Not on file  . Highest education level: Not on file  Occupational History  . Occupation: Retired   Scientific laboratory technician  . Financial resource strain: Not on file  . Food insecurity    Worry: Not on file    Inability: Not on file  . Transportation needs    Medical: Not on file    Non-medical: Not on file  Tobacco Use  . Smoking status: Former Research scientist (life sciences)  . Smokeless  tobacco: Never Used  Substance and Sexual Activity  . Alcohol use: No  . Drug use: No  . Sexual activity: Not on file  Lifestyle  . Physical activity    Days per week: Not on file    Minutes per session: Not on file  . Stress: Not on file  Relationships  . Social Herbalist on phone: Not on file    Gets together: Not on file    Attends religious service: Not on file    Active member of club or organization: Not on file    Attends meetings of clubs or organizations:  Not on file    Relationship status: Not on file  Other Topics Concern  . Not on file  Social History Narrative  . Not on file     Family History: The patient's family history includes Breast cancer in her mother; Cancer in her maternal grandfather; Diabetes in her sister; Heart attack in her brother, maternal uncle, and sister; Hypertension in her father; Prostate cancer in her father; Stroke in her father. There is no history of Esophageal cancer. ROS:   Please see the history of present illness.    All 14 point review of systems negative except as described per history of present illness  EKGs/Labs/Other Studies Reviewed:      Recent Labs: No results found for requested labs within last 8760 hours.  Recent Lipid Panel No results found for: CHOL, TRIG, HDL, CHOLHDL, VLDL, LDLCALC, LDLDIRECT  Physical Exam:    VS:  BP (!) 144/60   Pulse 63   Ht 5\' 3"  (1.6 m)   Wt 174 lb (78.9 kg)   SpO2 99%   BMI 30.82 kg/m     Wt Readings from Last 3 Encounters:  09/06/18 174 lb (78.9 kg)  05/30/18 169 lb 8 oz (76.9 kg)  03/29/18 169 lb 6.4 oz (76.8 kg)     GEN:  Well nourished, well developed in no acute distress HEENT: Normal NECK: No JVD; No carotid bruits LYMPHATICS: No lymphadenopathy CARDIAC: RRR, no murmurs, no rubs, no gallops RESPIRATORY:  Clear to auscultation without rales, wheezing or rhonchi  ABDOMEN: Soft, non-tender, non-distended MUSCULOSKELETAL:  No edema; No deformity  SKIN: Warm  and dry LOWER EXTREMITIES: no swelling NEUROLOGIC:  Alert and oriented x 3 PSYCHIATRIC:  Normal affect   ASSESSMENT:    1. Coronary artery disease involving native coronary artery of native heart without angina pectoris   2. Syncope, unspecified syncope type   3. Dyslipidemia   4. Paroxysmal atrial fibrillation (HCC)   5. Presence of Watchman left atrial appendage closure device    PLAN:    In order of problems listed above:  1. Coronary disease stable denies having the symptoms. 2. Syncope, implantable loop recorder present.  No recent episodes of syncope.  She denies having any issues lately.  No arrhythmia picked up by the device 3. Dyslipidemia we will continue with her present medications. 4. Paroxysmal atrial fibrillation denies having any palpitations, watchman device present.   Medication Adjustments/Labs and Tests Ordered: Current medicines are reviewed at length with the patient today.  Concerns regarding medicines are outlined above.  No orders of the defined types were placed in this encounter.  Medication changes: No orders of the defined types were placed in this encounter.   Signed, Park Liter, MD, [facility] 09/06/2018 11:41 AM    [place]

## 2018-09-06 NOTE — Patient Instructions (Signed)
Medication Instructions:  .isntcur  If you need a refill on your cardiac medications before your next appointment, please call your pharmacy.   Lab work: None.  If you have labs (blood work) drawn today and your tests are completely normal, you will receive your results only by: Marland Kitchen MyChart Message (if you have MyChart) OR . A paper copy in the mail If you have any lab test that is abnormal or we need to change your treatment, we will call you to review the results.  Testing/Procedures: None.   Follow-Up: At [facility], you and your health needs are our priority.  As part of our continuing mission to provide you with exceptional heart care, we have created designated Provider Care Teams.  These Care Teams include your primary Cardiologist (physician) and Advanced Practice Providers (APPs -  Physician Assistants and Nurse Practitioners) who all work together to provide you with the care you need, when you need it. You will need a follow up appointment in 5 months.  Please call our office 2 months in advance to schedule this appointment.  You may see Jenne Campus, MD or another member of our [place] Provider Team in [place]: Shirlee More, MD . Jyl Heinz, MD  Any Other Special Instructions Will Be Listed Below (If Applicable).

## 2018-09-11 LAB — CUP PACEART REMOTE DEVICE CHECK
Date Time Interrogation Session: 20200808133543
Implantable Pulse Generator Implant Date: 20200122

## 2018-09-12 ENCOUNTER — Ambulatory Visit (INDEPENDENT_AMBULATORY_CARE_PROVIDER_SITE_OTHER): Payer: Medicare Other | Admitting: *Deleted

## 2018-09-12 DIAGNOSIS — R55 Syncope and collapse: Secondary | ICD-10-CM

## 2018-09-12 DIAGNOSIS — I480 Paroxysmal atrial fibrillation: Secondary | ICD-10-CM

## 2018-09-16 DIAGNOSIS — E669 Obesity, unspecified: Secondary | ICD-10-CM | POA: Diagnosis not present

## 2018-09-16 DIAGNOSIS — Z6830 Body mass index (BMI) 30.0-30.9, adult: Secondary | ICD-10-CM | POA: Diagnosis not present

## 2018-09-16 DIAGNOSIS — N184 Chronic kidney disease, stage 4 (severe): Secondary | ICD-10-CM | POA: Diagnosis not present

## 2018-09-16 DIAGNOSIS — E1122 Type 2 diabetes mellitus with diabetic chronic kidney disease: Secondary | ICD-10-CM | POA: Diagnosis not present

## 2018-09-19 NOTE — Progress Notes (Signed)
Carelink Summary Report / Loop Recorder 

## 2018-10-03 ENCOUNTER — Other Ambulatory Visit: Payer: Self-pay | Admitting: Cardiology

## 2018-10-13 ENCOUNTER — Ambulatory Visit (INDEPENDENT_AMBULATORY_CARE_PROVIDER_SITE_OTHER): Payer: Medicare Other | Admitting: *Deleted

## 2018-10-13 DIAGNOSIS — I480 Paroxysmal atrial fibrillation: Secondary | ICD-10-CM

## 2018-10-13 DIAGNOSIS — R55 Syncope and collapse: Secondary | ICD-10-CM

## 2018-10-13 LAB — CUP PACEART REMOTE DEVICE CHECK
Date Time Interrogation Session: 20200910111505
Implantable Pulse Generator Implant Date: 20200122

## 2018-10-17 DIAGNOSIS — N179 Acute kidney failure, unspecified: Secondary | ICD-10-CM | POA: Diagnosis not present

## 2018-10-17 DIAGNOSIS — N184 Chronic kidney disease, stage 4 (severe): Secondary | ICD-10-CM | POA: Diagnosis not present

## 2018-10-20 DIAGNOSIS — I129 Hypertensive chronic kidney disease with stage 1 through stage 4 chronic kidney disease, or unspecified chronic kidney disease: Secondary | ICD-10-CM | POA: Diagnosis not present

## 2018-10-20 DIAGNOSIS — E889 Metabolic disorder, unspecified: Secondary | ICD-10-CM | POA: Diagnosis not present

## 2018-10-20 DIAGNOSIS — E1122 Type 2 diabetes mellitus with diabetic chronic kidney disease: Secondary | ICD-10-CM | POA: Diagnosis not present

## 2018-10-20 DIAGNOSIS — N184 Chronic kidney disease, stage 4 (severe): Secondary | ICD-10-CM | POA: Diagnosis not present

## 2018-10-21 NOTE — Progress Notes (Signed)
Carelink Summary Report / Loop Recorder 

## 2018-10-25 DIAGNOSIS — H2513 Age-related nuclear cataract, bilateral: Secondary | ICD-10-CM | POA: Diagnosis not present

## 2018-10-25 DIAGNOSIS — H25013 Cortical age-related cataract, bilateral: Secondary | ICD-10-CM | POA: Diagnosis not present

## 2018-10-25 DIAGNOSIS — H18413 Arcus senilis, bilateral: Secondary | ICD-10-CM | POA: Diagnosis not present

## 2018-10-25 DIAGNOSIS — H25043 Posterior subcapsular polar age-related cataract, bilateral: Secondary | ICD-10-CM | POA: Diagnosis not present

## 2018-11-01 DIAGNOSIS — E039 Hypothyroidism, unspecified: Secondary | ICD-10-CM | POA: Diagnosis not present

## 2018-11-01 DIAGNOSIS — E785 Hyperlipidemia, unspecified: Secondary | ICD-10-CM | POA: Diagnosis not present

## 2018-11-01 DIAGNOSIS — N184 Chronic kidney disease, stage 4 (severe): Secondary | ICD-10-CM | POA: Diagnosis not present

## 2018-11-01 DIAGNOSIS — M109 Gout, unspecified: Secondary | ICD-10-CM | POA: Diagnosis not present

## 2018-11-01 DIAGNOSIS — E1122 Type 2 diabetes mellitus with diabetic chronic kidney disease: Secondary | ICD-10-CM | POA: Diagnosis not present

## 2018-11-15 ENCOUNTER — Ambulatory Visit (INDEPENDENT_AMBULATORY_CARE_PROVIDER_SITE_OTHER): Payer: Medicare Other | Admitting: *Deleted

## 2018-11-15 DIAGNOSIS — I480 Paroxysmal atrial fibrillation: Secondary | ICD-10-CM

## 2018-11-15 DIAGNOSIS — R55 Syncope and collapse: Secondary | ICD-10-CM

## 2018-11-15 LAB — CUP PACEART REMOTE DEVICE CHECK
Date Time Interrogation Session: 20201013134037
Implantable Pulse Generator Implant Date: 20200122

## 2018-11-21 ENCOUNTER — Other Ambulatory Visit: Payer: Self-pay | Admitting: Cardiology

## 2018-11-21 NOTE — Telephone Encounter (Signed)
Refill Zetia sent per request.

## 2018-11-23 DIAGNOSIS — Z23 Encounter for immunization: Secondary | ICD-10-CM | POA: Diagnosis not present

## 2018-11-24 NOTE — Progress Notes (Signed)
Carelink Summary Report / Loop Recorder 

## 2018-12-18 LAB — CUP PACEART REMOTE DEVICE CHECK
Date Time Interrogation Session: 20201115134349
Implantable Pulse Generator Implant Date: 20200122

## 2018-12-19 ENCOUNTER — Ambulatory Visit (INDEPENDENT_AMBULATORY_CARE_PROVIDER_SITE_OTHER): Payer: Medicare Other | Admitting: *Deleted

## 2018-12-19 DIAGNOSIS — I480 Paroxysmal atrial fibrillation: Secondary | ICD-10-CM

## 2018-12-19 DIAGNOSIS — R55 Syncope and collapse: Secondary | ICD-10-CM | POA: Diagnosis not present

## 2019-01-12 NOTE — Progress Notes (Signed)
Carelink Summary Report / Loop Recorder 

## 2019-01-20 ENCOUNTER — Ambulatory Visit (INDEPENDENT_AMBULATORY_CARE_PROVIDER_SITE_OTHER): Payer: Medicare Other | Admitting: *Deleted

## 2019-01-20 DIAGNOSIS — R55 Syncope and collapse: Secondary | ICD-10-CM | POA: Diagnosis not present

## 2019-01-20 LAB — CUP PACEART REMOTE DEVICE CHECK
Date Time Interrogation Session: 20201218084526
Implantable Pulse Generator Implant Date: 20200122

## 2019-01-23 ENCOUNTER — Other Ambulatory Visit: Payer: Self-pay | Admitting: Cardiology

## 2019-01-24 DIAGNOSIS — Z6831 Body mass index (BMI) 31.0-31.9, adult: Secondary | ICD-10-CM | POA: Diagnosis not present

## 2019-01-24 DIAGNOSIS — Z8639 Personal history of other endocrine, nutritional and metabolic disease: Secondary | ICD-10-CM | POA: Diagnosis not present

## 2019-01-24 DIAGNOSIS — I129 Hypertensive chronic kidney disease with stage 1 through stage 4 chronic kidney disease, or unspecified chronic kidney disease: Secondary | ICD-10-CM | POA: Diagnosis not present

## 2019-01-24 DIAGNOSIS — E669 Obesity, unspecified: Secondary | ICD-10-CM | POA: Diagnosis not present

## 2019-02-08 NOTE — Progress Notes (Signed)
ILR remote 

## 2019-02-10 DIAGNOSIS — H353131 Nonexudative age-related macular degeneration, bilateral, early dry stage: Secondary | ICD-10-CM | POA: Diagnosis not present

## 2019-02-10 DIAGNOSIS — H18593 Other hereditary corneal dystrophies, bilateral: Secondary | ICD-10-CM | POA: Diagnosis not present

## 2019-02-10 DIAGNOSIS — H25043 Posterior subcapsular polar age-related cataract, bilateral: Secondary | ICD-10-CM | POA: Diagnosis not present

## 2019-02-10 DIAGNOSIS — H2513 Age-related nuclear cataract, bilateral: Secondary | ICD-10-CM | POA: Diagnosis not present

## 2019-02-13 ENCOUNTER — Encounter: Payer: Self-pay | Admitting: Cardiology

## 2019-02-13 ENCOUNTER — Other Ambulatory Visit: Payer: Self-pay

## 2019-02-13 ENCOUNTER — Ambulatory Visit (INDEPENDENT_AMBULATORY_CARE_PROVIDER_SITE_OTHER): Payer: Medicare Other | Admitting: Cardiology

## 2019-02-13 VITALS — BP 138/74 | HR 60 | Ht 63.0 in | Wt 178.0 lb

## 2019-02-13 DIAGNOSIS — I2510 Atherosclerotic heart disease of native coronary artery without angina pectoris: Secondary | ICD-10-CM

## 2019-02-13 DIAGNOSIS — I480 Paroxysmal atrial fibrillation: Secondary | ICD-10-CM

## 2019-02-13 DIAGNOSIS — N184 Chronic kidney disease, stage 4 (severe): Secondary | ICD-10-CM

## 2019-02-13 DIAGNOSIS — R001 Bradycardia, unspecified: Secondary | ICD-10-CM

## 2019-02-13 DIAGNOSIS — E1122 Type 2 diabetes mellitus with diabetic chronic kidney disease: Secondary | ICD-10-CM | POA: Diagnosis not present

## 2019-02-13 DIAGNOSIS — Z79899 Other long term (current) drug therapy: Secondary | ICD-10-CM

## 2019-02-13 NOTE — Patient Instructions (Addendum)
Medication Instructions:  Your physician recommends that you continue on your current medications as directed. Please refer to the Current Medication list given to you today.  *If you need a refill on your cardiac medications before your next appointment, please call your pharmacy*  Lab Work: Lipids- today   If you have labs (blood work) drawn today and your tests are completely normal, you will receive your results only by: Marland Kitchen MyChart Message (if you have MyChart) OR . A paper copy in the mail If you have any lab test that is abnormal or we need to change your treatment, we will call you to review the results.  Testing/Procedures: None ordered   Follow-Up: At [facility], you and your health needs are our priority.  As part of our continuing mission to provide you with exceptional heart care, we have created designated Provider Care Teams.  These Care Teams include your primary Cardiologist (physician) and Advanced Practice Providers (APPs -  Physician Assistants and Nurse Practitioners) who all work together to provide you with the care you need, when you need it.  Your next appointment:   5 month(s)  The format for your next appointment:   In Person  Provider:   Jenne Campus, MD  Other Instructions None

## 2019-02-13 NOTE — Progress Notes (Signed)
Cardiology Office Note:    Date:  02/13/2019   ID:  Amanda [name], DOB [birth year], MRN [id number]  PCP:  Street, Sharon Mt, MD  Cardiologist:  Jenne Campus, MD    Referring MD: Street, Sharon Mt, *   Chief Complaint  Patient presents with  . Follow-up  Doing well  History of Present Illness:    Amanda [name] is a 79 y.o. female with history of paroxysmal atrial fibrillation, not anticoagulated because of history of GI bleed, status post watchman device, history of coronary artery disease, chronic kidney failure, hypertension comes today to my office for follow-up overall doing well and looks good.  Denies of any chest pain tightness squeezing pressure burning chest.  She is frustrated with coronavirus situation.  We were talking about her hip pain that he has we were before talk about potentially having surgery however she said she is not interested the right now and doing overall very well from that point review  Past Medical History:  Diagnosis Date  . Acute ischemic stroke ([place])   . Acute metabolic encephalopathy   . Acute on chronic systolic (congestive) heart failure ([place])   . Atrial fibrillation ([place])   . Chronic anemia   . Chronic kidney disease (CKD), stage III (moderate)   . Diabetes mellitus without complication ([place])   . Diverticulosis   . Gastroesophageal reflux   . History of cardiac monitoring 02/23/2018   monitor inserted  . Hyperlipidemia   . Hypertensive heart disease with heart failure ([place])   . Myocardial infarction ([place])   . OSA on CPAP   . Renal failure, chronic, stage 3 (moderate)   . Systolic heart failure ([place])   . Thyroid disease     Past Surgical History:  Procedure Laterality Date  . CARDIAC CATHETERIZATION    . COLONOSCOPY  11/21/2014   Moderate predominantly sigmoid diverticulosis. Otherwise noraml collonscopy to TI.   Marland Kitchen CORONARY ANGIOPLASTY WITH STENT PLACEMENT  08/2016  . EXCISIONAL HEMORRHOIDECTOMY    . LEFT ATRIAL APPENDAGE  OCCLUSION  11/2016   in [place]  . LOOP RECORDER INSERTION N/A 02/23/2018   Procedure: LOOP RECORDER INSERTION;  Surgeon: Constance Haw, MD;  Location: [facility];  Service: Cardiovascular;  Laterality: N/A;  . NOSE SURGERY      Current Medications: Current Meds  Medication Sig  . Biotin 5000 MCG CAPS Take 1 capsule by mouth daily.  . Cholecalciferol (VITAMIN D3) 50 MCG (2000 UT) TABS Take 2,000 Units by mouth daily at 12 noon.  . clopidogrel (PLAVIX) 75 MG tablet Take 75 mg by mouth daily.  . Coenzyme Q10 100 MG TABS Take 100 mg by mouth daily at 12 noon.   . colchicine 0.6 MG tablet Take 0.6-1.2 mg by mouth daily as needed (for gout attack). 1.2mg  at onset of gout attack, then 0.6mg  an hour later. Next day 0.6mg  twice a day, then 0.6mg  daily until gout pain subsides.   Marland Kitchen estradiol (ESTRACE) 0.1 MG/GM vaginal cream Place 1 Applicatorful vaginally 3 (three) times a week.  . ezetimibe (ZETIA) 10 MG tablet TAKE 1 TABLET BY MOUTH DAILY  . febuxostat (ULORIC) 40 MG tablet Take 40 mg by mouth every evening.   . ferrous sulfate 325 (65 FE) MG EC tablet Take 325 mg by mouth 2 (two) times daily.   . folic acid (FOLVITE) A999333 MCG tablet Take 400 mcg by mouth daily at 12 noon.   . furosemide (LASIX) 20 MG tablet Take 20-40 mg by mouth  See admin instructions. Take 2 tablets (40 mg) by mouth daily in the morning & 1 tablet (20 mg) by mouth at lunch  . Glucosamine-Chondroitin (OSTEO BI-FLEX REGULAR STRENGTH PO) Take 1 tablet by mouth daily at 12 noon.  . isosorbide mononitrate (IMDUR) 30 MG 24 hr tablet TAKE 1 TABLET BY MOUTH DAILY.  Marland Kitchen levothyroxine (SYNTHROID, LEVOTHROID) 50 MCG tablet Take 50 mcg by mouth daily before breakfast.  . Multiple Vitamins-Minerals (OCUVITE ADULT 50+ PO) Take 1 tablet by mouth daily at 12 noon.   . nitroGLYCERIN (NITROSTAT) 0.4 MG SL tablet Place 0.4 mg under the tongue every 5 (five) minutes as needed for chest pain.  Marland Kitchen omega-3 acid ethyl esters (LOVAZA) 1  g capsule TAKE 1 CAPSULE BY MOUTH DAILY AT 12 NOON.  . pantoprazole (PROTONIX) 40 MG tablet TAKE 1 TABLET BY MOUTH DAILY.  Marland Kitchen polyethylene glycol powder (GLYCOLAX/MIRALAX) powder Take 17 g by mouth every other day. Alternating between miralax and benefiber  . pravastatin (PRAVACHOL) 40 MG tablet Take 40 mg by mouth every evening.   . Probiotic Product (PRO-BIOTIC BLEND PO) Take 1 capsule by mouth daily at 12 noon.   . ranolazine (RANEXA) 1000 MG SR tablet TAKE 1 TABLET BY MOUTH 2 TIMES DAILY.  . traMADol (ULTRAM) 50 MG tablet Take 50 mg by mouth every 12 (twelve) hours as needed for moderate pain.   . vitamin B-12 (CYANOCOBALAMIN) 1000 MCG tablet Take 1,000 mcg by mouth daily at 12 noon.  . Wheat Dextrin (BENEFIBER PO) Take 1 Dose by mouth every other day. Alternating between benefiber & miralax  . zolpidem (AMBIEN) 10 MG tablet Take 10 mg by mouth at bedtime as needed for sleep.   Current Facility-Administered Medications for the 02/13/19 encounter (Office Visit) with Park Liter, MD  Medication  . 0.9 %  sodium chloride infusion     Allergies:   Ciprofloxacin and Statins   Social History   Socioeconomic History  . Marital status: Married    Spouse name: Not on file  . Number of children: 2  . Years of education: Not on file  . Highest education level: Not on file  Occupational History  . Occupation: Retired   Tobacco Use  . Smoking status: Former Research scientist (life sciences)  . Smokeless tobacco: Never Used  Substance and Sexual Activity  . Alcohol use: No  . Drug use: No  . Sexual activity: Not on file  Other Topics Concern  . Not on file  Social History Narrative  . Not on file   Social Determinants of Health   Financial Resource Strain:   . Difficulty of Paying Living Expenses: Not on file  Food Insecurity:   . Worried About Charity fundraiser in the Last Year: Not on file  . Ran Out of Food in the Last Year: Not on file  Transportation Needs:   . Lack of Transportation  (Medical): Not on file  . Lack of Transportation (Non-Medical): Not on file  Physical Activity:   . Days of Exercise per Week: Not on file  . Minutes of Exercise per Session: Not on file  Stress:   . Feeling of Stress : Not on file  Social Connections:   . Frequency of Communication with Friends and Family: Not on file  . Frequency of Social Gatherings with Friends and Family: Not on file  . Attends Religious Services: Not on file  . Active Member of Clubs or Organizations: Not on file  . Attends Archivist Meetings:  Not on file  . Marital Status: Not on file     Family History: The patient's family history includes Breast cancer in her mother; Cancer in her maternal grandfather; Diabetes in her sister; Heart attack in her brother, maternal uncle, and sister; Hypertension in her father; Prostate cancer in her father; Stroke in her father. There is no history of Esophageal cancer. ROS:   Please see the history of present illness.    All 14 point review of systems negative except as described per history of present illness  EKGs/Labs/Other Studies Reviewed:      Recent Labs: No results found for requested labs within last 8760 hours.  Recent Lipid Panel No results found for: CHOL, TRIG, HDL, CHOLHDL, VLDL, LDLCALC, LDLDIRECT  Physical Exam:    VS:  BP 138/74   Pulse 60   Ht 5\' 3"  (1.6 m)   Wt 178 lb (80.7 kg)   SpO2 98%   BMI 31.53 kg/m     Wt Readings from Last 3 Encounters:  02/13/19 178 lb (80.7 kg)  09/06/18 174 lb (78.9 kg)  05/30/18 169 lb 8 oz (76.9 kg)     GEN:  Well nourished, well developed in no acute distress HEENT: Normal NECK: No JVD; No carotid bruits LYMPHATICS: No lymphadenopathy CARDIAC: RRR, no murmurs, no rubs, no gallops RESPIRATORY:  Clear to auscultation without rales, wheezing or rhonchi  ABDOMEN: Soft, non-tender, non-distended MUSCULOSKELETAL:  No edema; No deformity  SKIN: Warm and dry LOWER EXTREMITIES: no  swelling NEUROLOGIC:  Alert and oriented x 3 PSYCHIATRIC:  Normal affect   ASSESSMENT:    1. Medication management   2. Paroxysmal atrial fibrillation (HCC)   3. Coronary artery disease involving native coronary artery of native heart without angina pectoris   4. 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 Bradycardia    PLAN:    In order of problems listed above:  1. Medication management doing well continue present management 2. Paroxysmal atrial fibrillation denies having a palpitations. 3. Coronary disease stable 4. Diabetes followed by 10 medicine team 5. Bradycardia she does have implantable loop recorder she did not have any recording showing critical bradycardia   Medication Adjustments/Labs and Tests Ordered: Current medicines are reviewed at length with the patient today.  Concerns regarding medicines are outlined above.  Orders Placed This Encounter  Procedures  . Lipid panel   Medication changes: No orders of the defined types were placed in this encounter.   Signed, Park Liter, MD, [facility] 02/13/2019 3:55 PM    [place]

## 2019-02-14 LAB — LIPID PANEL
Chol/HDL Ratio: 3.4 ratio (ref 0.0–4.4)
Cholesterol, Total: 196 mg/dL (ref 100–199)
HDL: 58 mg/dL (ref >39)
LDL Chol Calc (NIH): 106 mg/dL — ABNORMAL HIGH (ref 0–99)
Triglycerides: 188 mg/dL — ABNORMAL HIGH (ref 0–149)
VLDL Cholesterol Cal: 32 mg/dL (ref 5–40)

## 2019-02-17 ENCOUNTER — Telehealth: Payer: Self-pay | Admitting: Emergency Medicine

## 2019-02-17 DIAGNOSIS — E785 Hyperlipidemia, unspecified: Secondary | ICD-10-CM

## 2019-02-17 NOTE — Telephone Encounter (Signed)
Called patient informed her of lab results and referred her to lipid clinic. She verbally understood no further questions.

## 2019-02-20 ENCOUNTER — Telehealth: Payer: Self-pay

## 2019-02-20 NOTE — Telephone Encounter (Signed)
Patient reports no symptoms during pause on 02/17/19. Complete transmission received that showed no ectopy or pauses on 02/17/19 @ 1304 , showed Sr.

## 2019-02-20 NOTE — Telephone Encounter (Signed)
[facility] requesting callback.  Need manual transmission of LinQ ILR

## 2019-02-20 NOTE — Telephone Encounter (Signed)
Manual transmission reviewed with Dr. Curt Bears. Pause episode from 1/15 at 12:48 shows transient CHB with >6sec pause (with escape beat), followed by 6sec of 2:1 HB, V rate 20bpm and return to NSR. Symptom ECG from 1/15 at 13:04 shows NSR.  Discussed episode again with patient. She reports that her husband used the symptom activator shortly after a syncopal event on 1/15, correlated with time of detected pause episode. Pt reports she suddenly felt lightheaded and sweaty, able to get to the table and sit down. Passed out for about 15sec at the table (seated position) per her husband. No injury. Reports she has been feeling well since then, no other cardiac symptoms.  Per Dr. Curt Bears, plan for f/u next available to discuss PPM. Recommended seeking emergency medical attention for any syncopal events with injury, advised to call our office for any episodes without injury. Advised pt of recommendations, also advised no driving x6 months due to syncope per [place] DMV. Pt verbalizes understanding of all recommendations, scheduled for f/u with Dr. Curt Bears on 03/06/19 at 8:30am in [place]. No additional questions at this time.

## 2019-02-22 ENCOUNTER — Ambulatory Visit (INDEPENDENT_AMBULATORY_CARE_PROVIDER_SITE_OTHER): Payer: Medicare Other | Admitting: *Deleted

## 2019-02-22 DIAGNOSIS — R55 Syncope and collapse: Secondary | ICD-10-CM | POA: Diagnosis not present

## 2019-02-22 LAB — CUP PACEART REMOTE DEVICE CHECK
Date Time Interrogation Session: 20210120090052
Implantable Pulse Generator Implant Date: 20200122

## 2019-02-23 ENCOUNTER — Telehealth: Payer: Self-pay | Admitting: *Deleted

## 2019-02-23 NOTE — Telephone Encounter (Signed)
   [facility] Pre-operative Risk Assessment    Request for surgical clearance:  1. What type of surgery is being performed? CATARACT EXTRACTION w/INTROCULAR LENS IMPLANT OF THE LEFT EYE; FOLLOWED BY THE RIGHT EYE   2. When is this surgery scheduled? 03/20/19   3. What type of clearance is required (medical clearance vs. Pharmacy clearance to hold med vs. Both)? MEDICAL  4. Are there any medications that need to be held prior to surgery and how long? SURGEON STATES NO NEED TO HOLD ANY MEDICATIONS  5. Practice name and name of physician performing surgery? [facility]; DOCTOR IS NOT LISTED   6. What is your office phone number [phone]    7.   What is your office fax number [phone]  8.   Anesthesia type (None, local, MAC, general) ? TOPICAL w/IV MEDICATION    Amanda [name] 02/23/2019, 12:21 PM  _________________________________________________________________   (provider comments below)

## 2019-02-23 NOTE — Telephone Encounter (Signed)
   Primary Cardiologist: Jenne Campus, MD  Chart reviewed as part of pre-operative protocol coverage. Cataract extractions are recognized in guidelines as low risk surgeries that do not typically require specific preoperative testing or holding of blood thinner therapy. Therefore, given past medical history and recent office visit doing very well, based on ACC/AHA guidelines, Amanda [name] would be at acceptable risk for the planned procedure without further cardiovascular testing.   I will route this recommendation to the requesting party via Epic fax function and remove from pre-op pool.  Please call with questions.  Daune Perch, NP 02/23/2019, 12:51 PM

## 2019-03-06 ENCOUNTER — Other Ambulatory Visit: Payer: Self-pay

## 2019-03-06 ENCOUNTER — Encounter: Payer: Self-pay | Admitting: Cardiology

## 2019-03-06 ENCOUNTER — Telehealth: Payer: Self-pay | Admitting: Cardiology

## 2019-03-06 ENCOUNTER — Ambulatory Visit (INDEPENDENT_AMBULATORY_CARE_PROVIDER_SITE_OTHER): Payer: Medicare Other | Admitting: Cardiology

## 2019-03-06 VITALS — BP 132/68 | HR 63 | Ht 63.0 in | Wt 177.1 lb

## 2019-03-06 DIAGNOSIS — Z01812 Encounter for preprocedural laboratory examination: Secondary | ICD-10-CM | POA: Diagnosis not present

## 2019-03-06 DIAGNOSIS — I442 Atrioventricular block, complete: Secondary | ICD-10-CM | POA: Diagnosis not present

## 2019-03-06 LAB — CUP PACEART INCLINIC DEVICE CHECK
Date Time Interrogation Session: 20210201090257
Implantable Pulse Generator Implant Date: 20200122

## 2019-03-06 NOTE — Telephone Encounter (Signed)
New Message  Pt called and stated that she saw Dr. 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 She stated also that she is having cateract surgery on 03/18/19 and states that the dr. Roney Jaffe that its not enough time in between for the cataract surgery and the pacemaker implant. She says she needs to reschedule the pacemaker implant she her cataract appt was already scheduled.

## 2019-03-06 NOTE — Patient Instructions (Addendum)
Medication Instructions:  Your physician recommends that you continue on your current medications as directed. Please refer to the Current Medication list given to you today.     * If you need a refill on your cardiac medications before your next appointment, please call your pharmacy. *   Labwork: Pre procedure lab work today: BMET & CBC * Will notify you of abnormal results, otherwise continue current treatment plan.*   Testing/Procedures: Your physician has recommended that you have a pacemaker inserted. A pacemaker is a small device that is placed under the skin of your chest or abdomen to help control abnormal heart rhythms. This device uses electrical pulses to prompt the heart to beat at a normal rate. Pacemakers are used to treat heart rhythms that are too slow. Wire (leads) are attached to the pacemaker that goes into the chambers of you heart. This is done in the hospital and usually requires and overnight stay. Please follow the instructions below, located under the special instructions section.   Follow-Up: Your physician recommends that you schedule a wound check appointment 10-14 days, after your procedure on 03/22/2019, with the device clinic.  Your physician recommends that you schedule a follow up appointment in 91 days, after your procedure on 03/22/2019, with Dr. Curt Bears.   Thank you for choosing [facility]!!   Trinidad Curet, RN [phone]   Any Other Special Instructions Will Be Listed Below (If Applicable).     Implantable Device Instructions  You are scheduled for: Pacemaker implant on 03/22/2019 with Dr. Curt Bears.  1.   Pre procedure testing-             A.  LAB WORK--- On 03/06/19 you are scheduled to have blood work at the [facility] (see address at the top of this letter) any time after 8:00 am.  You do not need to be fasting.               B. COVID TEST-- On 03/20/2019 @ 8:30 am - You will go to [facility] ([place])  for your Covid testing.   This is a drive thru test site.  There will be multiple testing areas.  Be sure to share with the first checkpoint that you are there for pre-procedure/surgery testing. This will put you into the right (yellow) lane that leads to the PAT testing team.   Stay in your car and the nurse team will come to your car to test you.  After you are tested please go home and self quarantine until the day of your procedure.    2. On the day of your procedure 03/22/2019 you will go to [facility] [phone]) at 8:30 am.  Dennis Bast will go to the main entrance [address] [employer]) and enter where the [employer] are.  You will check in at ADMITTING.  You may have one support person come in to the hospital with you.  They will be asked to wait in the waiting room.   3.   Do not eat or drink after midnight prior to your procedure.   4.   On the morning of your procedure do NOT take any medication.  5.  The night before your procedure and the morning of your procedure scrub your neck/chest with surgical scrub.  An instruction letter is located under your pacemaker instructions below.   5.  Plan for an overnight stay.  If you use your phone frequently bring  your Pensions consultant.  When you are discharged you will need someone to drive you home.   6.  You will follow up with the [place] clinic 10-14 days after your procedure. You will follow up with Dr. Curt Bears 91 days after your procedure.  These appointments will be made for you.   * If you have ANY questions after you get home, please call the office [phone] and ask for Markell Schrier RN or send a MyChart message.   [place] - Preparing For Surgery  Before surgery, you can play an important role. Because skin is not sterile, your skin needs to be as free of germs as possible. You can reduce the number of germs on your skin by washing with CHG (chlorahexidine gluconate) Soap before surgery.  CHG is an antiseptic  cleaner which kills germs and bonds with the skin to continue killing germs even after washing.   Please do not use if you have an allergy to CHG or antibacterial soaps.  If your skin becomes reddened/irritated stop using the CHG.   Do not shave (including legs and underarms) for at least 48 hours prior to first CHG shower.  It is OK to shave your face.  Please follow these instructions carefully:  1.  Shower the night before surgery and the morning of surgery with CHG.  2.  If you choose to wash your hair, wash your hair first as usual with your normal shampoo.  3.  After you shampoo, rinse your hair and body thoroughly to remove the shampoo.  4.  Use CHG as you would any other liquid soap.  You can apply CHG directly to the skin and wash gently with a clean washcloth. 5.  Apply the CHG Soap to your body ONLY FROM THE NECK DOWN.  Do not use on open wounds or open sores.  Avoid contact with your eyes, ears, mouth and genitals (private parts).  Wash genitals (private parts) with your normal soap.  6.  Wash thoroughly, paying special attention to the area where your surgery will be performed.  7.  Thoroughly rinse your body with warm water from the neck down.   8.  DO NOT shower/wash with your normal soap after using and rinsing off the CHG soap.  9.  Pat yourself dry with a clean towel.           10.  Wear clean pajamas.           11.  Place clean sheets on your bed the night of your first shower and do not sleep with pets.  Day of Surgery: Do not apply any deodorants/lotions.  Please wear clean clothes to the hospital/surgery center.    Pacemaker Implantation, Adult Pacemaker implantation is a procedure to place a pacemaker inside your chest. A pacemaker is a small computer that sends electrical signals to the heart and helps your heart beat normally. A pacemaker also stores information about your heart rhythms. You may need pacemaker implantation if you:  Have a slow heartbeat  (bradycardia).  Faint (syncope).  Have shortness of breath (dyspnea) due to heart problems.  The pacemaker attaches to your heart through a wire, called a lead. Sometimes just one lead is needed. Other times, there will be two leads. There are two types of pacemakers:  Transvenous pacemaker. This type is placed under the skin or muscle of your chest. The lead goes through a vein in the chest area to reach the inside of the heart.  Epicardial pacemaker. This type is placed under the skin or muscle of your chest or belly. The lead goes through your chest to the outside of the heart.  Tell a health care provider about:  Any allergies you have.  All medicines you are taking, including vitamins, herbs, eye drops, creams, and over-the-counter medicines.  Any problems you or family members have had with anesthetic medicines.  Any blood or bone disorders you have.  Any surgeries you have had.  Any medical conditions you have.  Whether you are pregnant or may be pregnant. What are the risks? Generally, this is a safe procedure. However, problems may occur, including:  Infection.  Bleeding.  Failure of the pacemaker or the lead.  Collapse of a lung or bleeding into a lung.  Blood clot inside a blood vessel with a lead.  Damage to the heart.  Infection inside the heart (endocarditis).  Allergic reactions to medicines.  What happens before the procedure? Staying hydrated Follow instructions from your health care provider about hydration, which may include:  Up to 2 hours before the procedure - you may continue to drink clear liquids, such as water, clear fruit juice, black coffee, and plain tea.  Eating and drinking restrictions Follow instructions from your health care provider about eating and drinking, which may include:  8 hours before the procedure - stop eating heavy meals or foods such as meat, fried foods, or fatty foods.  6 hours before the procedure - stop  eating light meals or foods, such as toast or cereal.  6 hours before the procedure - stop drinking milk or drinks that contain milk.  2 hours before the procedure - stop drinking clear liquids.  Medicines  Ask your health care provider about: ? Changing or stopping your regular medicines. This is especially important if you are taking diabetes medicines or blood thinners. ? Taking medicines such as aspirin and ibuprofen. These medicines can thin your blood. Do not take these medicines before your procedure if your health care provider instructs you not to.  You may be given antibiotic medicine to help prevent infection. General instructions  You will have a heart evaluation. This may include an electrocardiogram (ECG), chest X-ray, and heart imaging (echocardiogram,  or echo) tests.  You will have blood tests.  Do not use any products that contain nicotine or tobacco, such as cigarettes and e-cigarettes. If you need help quitting, ask your health care provider.  Plan to have someone take you home from the hospital or clinic.  If you will be going home right after the procedure, plan to have someone with you for 24 hours.  Ask your health care provider how your surgical site will be marked or identified. What happens during the procedure?  To reduce your risk of infection: ? Your health care team will wash or sanitize their hands. ? Your skin will be washed with soap. ? Hair may be removed from the surgical area.  An IV tube will be inserted into one of your veins.  You will be given one or more of the following: ? A medicine to help you relax (sedative). ? A medicine to numb the area (local anesthetic). ? A medicine to make you fall asleep (general anesthetic).  If you are getting a transvenous pacemaker: ? An incision will be made in your upper chest. ? A pocket will be made for the pacemaker. It may be placed under the skin or between layers of muscle. ? The lead will  be  inserted into a blood vessel that returns to the heart. ? While X-rays are taken by an imaging machine (fluoroscopy), the lead will be advanced through the vein to the inside of your heart. ? The other end of the lead will be tunneled under the skin and attached to the pacemaker.  If you are getting an epicardial pacemaker: ? An incision will be made near your ribs or breastbone (sternum) for the lead. ? The lead will be attached to the outside of your heart. ? Another incision will be made in your chest or upper belly to create a pocket for the pacemaker. ? The free end of the lead will be tunneled under the skin and attached to the pacemaker.  The transvenous or epicardial pacemaker will be tested. Imaging studies may be done to check the lead position.  The incisions will be closed with stitches (sutures), adhesive strips, or skin glue.  Bandages (dressing) will be placed over the incisions. The procedure may vary among health care providers and hospitals. What happens after the procedure?  Your blood pressure, heart rate, breathing rate, and blood oxygen level will be monitored until the medicines you were given have worn off.  You will be given antibiotics and pain medicine.  ECG and chest x-rays will be done.  You will wear a continuous type of ECG (Holter monitor) to check your heart rhythm.  Your health care provider will program the pacemaker.  Do not drive for 24 hours if you received a sedative. This information is not intended to replace advice given to you by your health care provider. Make sure you discuss any questions you have with your health care provider. Document Released: 01/09/2002 Document Revised: 08/09/2015 Document Reviewed: 07/03/2015 Elsevier Interactive Patient Education  2018 [employer].     Pacemaker Implantation, Adult, Care After This sheet gives you information about how to care for yourself after your procedure. Your health care provider may  also give you more specific instructions. If you have problems or questions, contact your health care provider. What can I expect after the procedure? After the procedure, it is common to have:  Mild pain.  Slight bruising.  Some swelling over the incision.  A slight bump over the skin where the device was placed. Sometimes, it is possible to feel the device under the skin. This is normal.  Follow these instructions at home: Medicines  Take over-the-counter and prescription medicines only as told by your health care provider.  If you were prescribed an antibiotic medicine, take it as told by your health care provider. Do not stop taking the antibiotic even if you start to feel better. Wound care  Do not remove the bandage on your chest until directed to do so by your health care provider.  After your bandage is removed, you may see pieces of tape called skin adhesive strips over the area where the cut was made (incision site). Let them fall off on their own.  Check the incision site every day to make sure it is not infected, bleeding, or starting to pull apart.  Do not use lotions or ointments near the incision site unless directed to do so.  Keep the incision area clean and dry for 2-3 days after the procedure or as directed by your health care provider. It takes several weeks for the incision site to completely heal.  Do not take baths, swim, or use a hot tub for 7-10 days or as otherwise directed by your  health care provider. Activity  Do not drive or use heavy machinery while taking prescription pain medicine.  Do not drive for 24 hours if you were given a medicine to help you relax (sedative).  Check with your health care provider before you start to drive or play sports.  Avoid sudden jerking, pulling, or chopping movements that pull your upper arm far away from your body. Avoid these movements for at least 6 weeks or as long as told by your health care provider.  Do  not lift your upper arm above your shoulders for at least 6 weeks or as long as told by your health care provider. This means no tennis, golf, or swimming.  You may go back to work when your health care provider says it is okay. Pacemaker care  You may be shown how to transfer data from your pacemaker through the phone to your health care provider.  Always let all health care providers know about your pacemaker before you have any medical procedures or tests.  Wear a medical ID bracelet or necklace stating that you have a pacemaker. Carry a pacemaker ID card with you at all times.  Your pacemaker battery will last for 5-15 years. Routine checks by your health care provider will let the health care provider know when the battery is starting to run down. The pacemaker will need to be replaced when the battery starts to run down.  Do not use amateur Chief of Staff. Other electrical devices are safe to use, including power tools, lawn mowers, and speakers. If you are unsure of whether something is safe to use, ask your health care provider.  When using your cell phone, hold it to the ear opposite the pacemaker. Do not leave your cell phone in a pocket over the pacemaker.  Avoid places or objects that have a strong electric or magnetic field, including: ? Airport Herbalist. When at the airport, let officials know that you have a pacemaker. ? Power plants. ? Large electrical generators. ? Radiofrequency transmission towers, such as cell phone and radio towers. General instructions  Weigh yourself every day. If you suddenly gain weight, fluid may be building up in your body.  Keep all follow-up visits as told by your health care provider. This is important. Contact a health care provider if:  You gain weight suddenly.  Your legs or feet swell.  It feels like your heart is fluttering or skipping beats (heart palpitations).  You have chills or a  fever.  You have more redness, swelling, or pain around your incisions.  You have more fluid or blood coming from your incisions.  Your incisions feel warm to the touch.  You have pus or a bad smell coming from your incisions. Get help right away if:  You have chest pain.  You have trouble breathing or are short of breath.  You become extremely tired.  You are light-headed or you faint. This information is not intended to replace advice given to you by your health care provider. Make sure you discuss any questions you have with your health care provider. Document Released: 08/08/2004 Document Revised: 11/01/2015 Document Reviewed: 11/01/2015 Elsevier Interactive Patient Education  2018 [place] Discharge Instructions for  Pacemaker/Defibrillator Patients  ACTIVITY No heavy lifting or vigorous activity with your left/right arm for 6 to 8 weeks.  Do not raise your left/right arm above your head for one week.  Gradually raise your affected arm as  drawn below.           __  NO DRIVING for     ; you may begin driving on     .  WOUND CARE - Keep the wound area clean and dry.  Do not get this area wet for one week. No showers for one week; you may shower on     . - The tape/steri-strips on your wound will fall off; do not pull them off.  No bandage is needed on the site.  DO  NOT apply any creams, oils, or ointments to the wound area. - If you notice any drainage or discharge from the wound, any swelling or bruising at the site, or you develop a fever > 101? F after you are discharged home, call the office at once.  SPECIAL INSTRUCTIONS - You are still able to use cellular telephones; use the ear opposite the side where you have your pacemaker/defibrillator.  Avoid carrying your cellular phone near your device. - When traveling through airports, show security personnel your identification card to avoid being screened in the metal detectors.  Ask the security  personnel to use the hand wand. - Avoid arc welding equipment, MRI testing (magnetic resonance imaging), TENS units (transcutaneous nerve stimulators).  Call the office for questions about other devices. - Avoid electrical appliances that are in poor condition or are not properly grounded. - Microwave ovens are safe to be near or to operate.  ADDITIONAL INFORMATION FOR DEFIBRILLATOR PATIENTS SHOULD YOUR DEVICE GO OFF: - If your device goes off ONCE and you feel fine afterward, notify the device clinic nurses. - If your device goes off ONCE and you do not feel well afterward, call 911. - If your device goes off TWICE, call 911. - If your device goes off [place], call 911.  DO NOT DRIVE YOURSELF OR A FAMILY MEMBER WITH A DEFIBRILLATOR TO THE HOSPITAL--CALL 911.

## 2019-03-06 NOTE — Progress Notes (Signed)
Electrophysiology Office Note   Date:  03/06/2019   ID:  Amanda [name], DOB 1940/12/04, MRN [id number]  PCP:  Street, Amanda Mt, MD  Cardiologist: Agustin Cree Primary Electrophysiologist:  Will Meredith Leeds, MD    No chief complaint on file.    History of Present Illness: Amanda [name] is a 79 y.o. female who is being seen today for the evaluation of atrial fibrillation, syncope at the request of Amanda [name]. Presenting today for electrophysiology evaluation.  She has a past history significant for ischemic stroke, atrial fibrillation, CKD stage III, diabetes hypertension OSA on CPAP.  She has a watchman implanted due to recurrent GI bleeds.  She had an episode of syncope.  She was sleeping well, wanting to go to the restroom.  She was sitting on the edge trying to get up and ended falling down.  It was thought this may be due to orthostasis.  He had an echo done that showed a normal ejection fraction and a cardiac monitor which showed PACs.  She has had multiple episodes of syncope and is now status post Linq monitor.  Today, denies symptoms of palpitations, chest pain, shortness of breath, orthopnea, PND, lower extremity edema, claudication, dizziness, syncope, bleeding, or neurologic sequela. The patient is tolerating medications without difficulties.  She overall feels well today.  She had an episode of presyncope 02/07/2019.  Her Linq monitor showed a long pause greater than 9 seconds.  Since that time she has felt well.  She has had no further symptoms.   Past Medical History:  Diagnosis Date  . Acute ischemic stroke ([place])   . Acute metabolic encephalopathy   . Acute on chronic systolic (congestive) heart failure ([place])   . Atrial fibrillation ([place])   . Chronic anemia   . Chronic kidney disease (CKD), stage III (moderate)   . Diabetes mellitus without complication ([place])   . Diverticulosis   . Gastroesophageal reflux   . History of cardiac monitoring 02/23/2018   monitor  inserted  . Hyperlipidemia   . Hypertensive heart disease with heart failure ([place])   . Myocardial infarction ([place])   . OSA on CPAP   . Renal failure, chronic, stage 3 (moderate)   . Systolic heart failure ([place])   . Thyroid disease    Past Surgical History:  Procedure Laterality Date  . CARDIAC CATHETERIZATION    . COLONOSCOPY  11/21/2014   Moderate predominantly sigmoid diverticulosis. Otherwise noraml collonscopy to TI.   Marland Kitchen CORONARY ANGIOPLASTY WITH STENT PLACEMENT  08/2016  . EXCISIONAL HEMORRHOIDECTOMY    . LEFT ATRIAL APPENDAGE OCCLUSION  11/2016   in [place]  . LOOP RECORDER INSERTION N/A 02/23/2018   Procedure: LOOP RECORDER INSERTION;  Surgeon: Constance Haw, MD;  Location: [facility];  Service: Cardiovascular;  Laterality: N/A;  . NOSE SURGERY       Current Outpatient Medications  Medication Sig Dispense Refill  . albuterol (PROVENTIL HFA;VENTOLIN HFA) 108 (90 Base) MCG/ACT inhaler Inhale 2 puffs into the lungs every 6 (six) hours as needed for wheezing or shortness of breath.    . Biotin 5000 MCG CAPS Take 1 capsule by mouth daily.    . Cholecalciferol (VITAMIN D3) 50 MCG (2000 UT) TABS Take 2,000 Units by mouth daily at 12 noon.    . clopidogrel (PLAVIX) 75 MG tablet Take 75 mg by mouth daily.    . Coenzyme Q10 100 MG TABS Take 100 mg by mouth daily at 12 noon.     Marland Kitchen  colchicine 0.6 MG tablet Take 0.6-1.2 mg by mouth daily as needed (for gout attack). 1.2mg  at onset of gout attack, then 0.6mg  an hour later. Next day 0.6mg  twice a day, then 0.6mg  daily until gout pain subsides.     Marland Kitchen estradiol (ESTRACE) 0.1 MG/GM vaginal cream Place 1 Applicatorful vaginally 3 (three) times a week.    . ezetimibe (ZETIA) 10 MG tablet TAKE 1 TABLET BY MOUTH DAILY 90 tablet 1  . febuxostat (ULORIC) 40 MG tablet Take 40 mg by mouth every evening.     . ferrous sulfate 325 (65 FE) MG EC tablet Take 325 mg by mouth 2 (two) times daily.     . folic acid (FOLVITE) A999333 MCG tablet  Take 400 mcg by mouth daily at 12 noon.     . furosemide (LASIX) 20 MG tablet Take 20-40 mg by mouth See admin instructions. Take 2 tablets (40 mg) by mouth daily in the morning & 1 tablet (20 mg) by mouth at lunch    . Glucosamine-Chondroitin (OSTEO BI-FLEX REGULAR STRENGTH PO) Take 1 tablet by mouth daily at 12 noon.    . isosorbide mononitrate (IMDUR) 30 MG 24 hr tablet TAKE 1 TABLET BY MOUTH DAILY. 90 tablet 1  . levothyroxine (SYNTHROID, LEVOTHROID) 50 MCG tablet Take 50 mcg by mouth daily before breakfast.    . Multiple Vitamins-Minerals (OCUVITE ADULT 50+ PO) Take 1 tablet by mouth daily at 12 noon.     . nitroGLYCERIN (NITROSTAT) 0.4 MG SL tablet Place 0.4 mg under the tongue every 5 (five) minutes as needed for chest pain.    Marland Kitchen omega-3 acid ethyl esters (LOVAZA) 1 g capsule TAKE 1 CAPSULE BY MOUTH DAILY AT 12 NOON. 90 capsule 1  . pantoprazole (PROTONIX) 40 MG tablet TAKE 1 TABLET BY MOUTH DAILY. 90 tablet 2  . polyethylene glycol powder (GLYCOLAX/MIRALAX) powder Take 17 g by mouth every other day. Alternating between miralax and benefiber    . pravastatin (PRAVACHOL) 40 MG tablet Take 40 mg by mouth every evening.     . Probiotic Product (PRO-BIOTIC BLEND PO) Take 1 capsule by mouth daily at 12 noon.     . ranolazine (RANEXA) 1000 MG SR tablet TAKE 1 TABLET BY MOUTH 2 TIMES DAILY. 180 tablet 1  . traMADol (ULTRAM) 50 MG tablet Take 50 mg by mouth every 12 (twelve) hours as needed for moderate pain.     . vitamin B-12 (CYANOCOBALAMIN) 1000 MCG tablet Take 1,000 mcg by mouth daily at 12 noon.    . Wheat Dextrin (BENEFIBER PO) Take 1 Dose by mouth every other day. Alternating between benefiber & miralax    . zolpidem (AMBIEN) 10 MG tablet Take 10 mg by mouth at bedtime as needed for sleep.     Current Facility-Administered Medications  Medication Dose Route Frequency Provider Last Rate Last Admin  . 0.9 %  sodium chloride infusion  500 mL Intravenous Once Jackquline Denmark, MD         Allergies:   Ciprofloxacin and Statins   Social History:  The patient  reports that she has quit smoking. She has never used smokeless tobacco. She reports that she does not drink alcohol or use drugs.   Family History:  The patient's family history includes Breast cancer in her mother; Cancer in her maternal grandfather; Diabetes in her sister; Heart attack in her brother, maternal uncle, and sister; Hypertension in her father; Prostate cancer in her father; Stroke in her father.    ROS:  Please see the history of present illness.   Otherwise, review of systems is positive for none.   All other systems are reviewed and negative.   PHYSICAL EXAM: VS:  BP 132/68   Pulse 63   Ht 5\' 3"  (1.6 m)   Wt 177 lb 1.9 oz (80.3 kg)   SpO2 98%   BMI 31.38 kg/m  , BMI Body mass index is 31.38 kg/m. GEN: Well nourished, well developed, in no acute distress  HEENT: normal  Neck: no JVD, carotid bruits, or masses Cardiac: RRR; no murmurs, rubs, or gallops,no edema  Respiratory:  clear to auscultation bilaterally, normal work of breathing GI: soft, nontender, nondistended, + BS MS: no deformity or atrophy  Skin: warm and dry, device site well healed Neuro:  Strength and sensation are intact Psych: euthymic mood, full affect  EKG:  EKG is ordered today. Personal review of the ekg ordered shows sinus rhythm, rate 63, right axis deviation  Personal review of the device interrogation today. Results in [place]: No results found for requested labs within last 8760 hours.    Lipid Panel     Component Value Date/Time   CHOL 196 02/13/2019 1557   TRIG 188 (H) 02/13/2019 1557   HDL 58 02/13/2019 1557   CHOLHDL 3.4 02/13/2019 1557   LDLCALC 106 (H) 02/13/2019 1557     Wt Readings from Last 3 Encounters:  03/06/19 177 lb 1.9 oz (80.3 kg)  02/13/19 178 lb (80.7 kg)  09/06/18 174 lb (78.9 kg)      Other studies Reviewed: Additional studies/ records that were reviewed today  include: TTE 12/22/2017 Review of the above records today demonstrates:  - Left ventricle: The cavity size was normal. Wall thickness was   increased in a pattern of moderate LVH. Systolic function was   normal. The estimated ejection fraction was in the range of 60%   to 65%. Wall motion was normal; there were no regional wall   motion abnormalities. Features are consistent with a pseudonormal   left ventricular filling pattern, with concomitant abnormal   relaxation and increased filling pressure (grade 2 diastolic   dysfunction). - Aortic valve: There was mild stenosis. Valve area (VTI): 1.57   cm^2. Valve area (Vmax): 1.45 cm^2. Valve area (Vmean): 1.57   cm^2. - Mitral valve: There was mild regurgitation. Valve area by   pressure half-time: 2.34 cm^2. - Left atrium: The atrium was moderately dilated.  Cardiac monitor 10/11/2017-personally reviewed Baseline rhythm: Sinus rhythm Atrial arrhythmia:.  Of APCs felt as fluttering or palpitations.  No clear-cut sustained arrhythmia.  No clear-cut atrial fibrillation identified Symptoms: A trigger events some because of palpitations patient got some APCs during those episodes.   ASSESSMENT AND PLAN:  1.  Paroxysmal atrial fibrillation: Status post watchman and thus not anticoagulated.  CHA2DS2-VASc 7.    2.  OSA: CPAP compliance encouraged  3.  Hypertension: Currently well controlled  4.  Syncope with complete heart block: Had a long pause on her Linq monitor associated with presyncope symptoms.  Due to that, we will plan for pacemaker implant.  She is on no rate controlling medications.  Risks and benefits were discussed include bleeding, tamponade, infection, pneumothorax, among others.  She understands these risks and is agreed to the procedure.    Case discussed with primary cardiology  Current medicines are reviewed at length with the patient today.   The patient does not have concerns regarding her medicines.  The following  changes  were made today:  none  Labs/ tests ordered today include:  Orders Placed This Encounter  Procedures  . Basic metabolic panel  . CBC  . EKG 12-Lead    Disposition:   FU with Will Camnitz 3 months  Signed, Will Meredith Leeds, MD  03/06/2019 9:27 AM     [facility] [address] [place] [address] [phone] (office) [phone] (fax)

## 2019-03-07 LAB — BASIC METABOLIC PANEL
BUN/Creatinine Ratio: 24 (ref 12–28)
BUN: 47 mg/dL — ABNORMAL HIGH (ref 8–27)
CO2: 25 mmol/L (ref 20–29)
Calcium: 10.4 mg/dL — ABNORMAL HIGH (ref 8.7–10.3)
Chloride: 105 mmol/L (ref 96–106)
Creatinine, Ser: 1.98 mg/dL — ABNORMAL HIGH (ref 0.57–1.00)
GFR calc Af Amer: 27 mL/min/{1.73_m2} — ABNORMAL LOW (ref >59)
GFR calc non Af Amer: 24 mL/min/{1.73_m2} — ABNORMAL LOW (ref >59)
Glucose: 147 mg/dL — ABNORMAL HIGH (ref 65–99)
Potassium: 4.9 mmol/L (ref 3.5–5.2)
Sodium: 145 mmol/L — ABNORMAL HIGH (ref 134–144)

## 2019-03-07 LAB — CBC
Hematocrit: 36.8 % (ref 34.0–46.6)
Hemoglobin: 12 g/dL (ref 11.1–15.9)
MCH: 32.2 pg (ref 26.6–33.0)
MCHC: 32.6 g/dL (ref 31.5–35.7)
MCV: 99 fL — ABNORMAL HIGH (ref 79–97)
Platelets: 230 10*3/uL (ref 150–450)
RBC: 3.73 x10E6/uL — ABNORMAL LOW (ref 3.77–5.28)
RDW: 11.8 % (ref 11.7–15.4)
WBC: 5.5 10*3/uL (ref 3.4–10.8)

## 2019-03-07 NOTE — Telephone Encounter (Signed)
Left message informing pt that I would contact her Thursday/Friday to discuss moving procedure date to March.

## 2019-03-08 ENCOUNTER — Telehealth: Payer: Self-pay | Admitting: *Deleted

## 2019-03-08 NOTE — Telephone Encounter (Signed)
Spoke with patient. She is aware of Dr. Macky Lower recommendations, no further questions.

## 2019-03-08 NOTE — Telephone Encounter (Signed)
Since she is back in sinus rhythm would hold off on further therapy for now, may start meds post pacer.

## 2019-03-08 NOTE — Telephone Encounter (Signed)
LINQ alert received for symptom episode on 03/07/19 at 05:43 showing AF w/RVR, correlates with detected AF episode, total duration ~12hrs per trend (programmed longest episode only, so only 1 ECG is available). Known PAF, s/p Watchman.  Spoke with patient. She reports that she woke up due to palpitations, awareness of AF. Had some neck pain and felt very weak all day yesterday. Symptoms resolved on their own eventually. Pt reports compliance with ranolazine 1000mg  BID, not currently on rate control medications due to recent CHB/pause episode. Plan for PPM implant in 04/2019 after cataract surgery completed. Advised pt that message will be forwarded to Dr. Curt Bears for review. She is aware that medications may not be able to be adjusted until after PPM is implanted, but wanted to document this episode anyway. Will plan to call back if any new recommendations from Dr. Curt Bears. Pt in agreement with plan, no further questions at this time.

## 2019-03-08 NOTE — Telephone Encounter (Signed)
Pt has been notified of her lab results by phone with verbal understanding. Pt states she is needing to reschedule her procedure with Dr. Curt Bears that is set for 2/17. Pt states she is having cataract surgery 03/20/19. I assured the pt that I will send my note to [place], Dr. Curt Bears nurse. Assured the pt that Venida Jarvis will reach out to her to reschedule procedure. Pt thanked me for the call and the help. The patient has been notified of the result and verbalized understanding.  All questions (if any) were answered. Stewart Gossman, [facility] 03/08/2019 3:41 PM

## 2019-03-08 NOTE — Telephone Encounter (Signed)
-----   Message from Will Meredith Leeds, MD sent at 03/07/2019 10:12 AM EST ----- Stable preop labs

## 2019-03-09 ENCOUNTER — Other Ambulatory Visit: Payer: Self-pay

## 2019-03-09 ENCOUNTER — Encounter: Payer: Self-pay | Admitting: Pharmacist Clinician (PhC)/ Clinical Pharmacy Specialist

## 2019-03-09 ENCOUNTER — Ambulatory Visit: Payer: Medicare Other | Admitting: Pharmacist Clinician (PhC)/ Clinical Pharmacy Specialist

## 2019-03-09 DIAGNOSIS — E785 Hyperlipidemia, unspecified: Secondary | ICD-10-CM

## 2019-03-09 NOTE — Telephone Encounter (Signed)
Spoke to pt. Rescheduled procedure to 3/22.  Pt aware to arrive at 5:30 am Covid screening rescheduled. Pt aware office will contact her to rescheduled post implant follow up. Patient verbalized understanding and agreeable to plan.

## 2019-03-09 NOTE — Assessment & Plan Note (Signed)
Had long discussion with patient about further options.  Reviewed information about Repatha as well as bempedoic acid.  Discussed mechanism of action, side effects and expected LDL changes.  Patient willing to start Repatha at this time.  Will also have her increase her lovaza prescription from 1 capsule daily to 2, as her triglycerides are slightly elevated at 188.  All questions were answered.  She will repeat labs in 3 months.  At that time if her LDL is < 40-50 we can consider cutting out ezetimibe to decrease her pill burden.

## 2019-03-09 NOTE — Patient Instructions (Addendum)
Your Results:             Your most recent labs Goal  Total Cholesterol 196 < 200  Triglycerides 188 < 150  HDL (happy/good cholesterol) 58 > 40  LDL (lousy/bad cholesterol 106 < 70     Medication changes:  Increase lovaza to 2 capsules once daily  We will start the paperwork to get Repatha covered by your Insurance.  Prescription to [facility] when approved.  Lab orders:  Repeat cholesterol labs in 3 months - we will send the lab order to you in the mail.  Patient Assistance:  The [employer] foundation offers assistance to help pay for medication copays.  They will cover copays for all cholesterol lowering meds, including statins, fibrates, omega-3 oils, ezetimibe, Repatha, Praluent, Nexletol, Nexlizet.  The cards are usually good for $2,500 or 12 months, whichever comes first. 1. Go to healthwellfoundation.org 2. Click on "Apply Now" 3. Answer questions as to whom is applying (patient or representative) 4. Your disease fund will be "hypercholesterolemia - Medicare access" 5. They will ask questions about finances and which medications you are taking for cholesterol 6. When you submit, the approval is usually within minutes.  You will need to print the card information from the site 7. You will need to show this information to your pharmacy, they will bill your Medicare Part D plan first -then bill [employer] --for the copay.   You can also call them at [phone], although the hold times can be quite long.   Thank you for choosing [facility]

## 2019-03-09 NOTE — Progress Notes (Signed)
03/09/2019 Amanda [name] 03-21-1940 [id number]   HPI:  Amanda [name] is a 79 y.o. female patient of Dr Agustin Cree, who presents today for a lipid clinic evaluation.  See pertinent past medical history below.  Patient is currently on pravastatin 40 mg and ezetimibe 10 mg, both daily, with LDL still not at goal, at 106.  She has previously tried other statin drugs, but was unable to tolerate because of joint pain.    Today she reports feeling well.  She is frustrated that her cholesterol is still above guidelines despite taking 3 medications, especially as she has managed to control blood pressure and diabetes with diet and lifestyle choices.  She is already familiar with Repatha as her husband uses it.    Past Medical History: Atrial fibrillation No anticoagulation d/t hx GI bleed; CHADS2-VASc score of 6; watchman device  CAD S/p NSTEMI July 18 one stent, stroke in August 18 - diminished word recall   CKD Stage 4 - SCR 1.98 (wt 80.3 kg) CrCl 29.7  hypertension Well controlled - currently no medication  DM2 A1c high 6.2, currently 5.8 (11/2018) diet controlled    Current Medications: pravastatin 40 mg, ezetimibe 10 mg, lovaza 1 gm qd  Cholesterol Goals: LDL < 70; TG < 150   Intolerant/previously tried: lipitor, crestor, zocor - joint pains  Diet: eats fairly healthy diet - managed to drop A1c from 6.2 to 5.8   Labs:  1/21:  TC 196, TG 188, HDL 58, LDL 106   Current Outpatient Medications  Medication Sig Dispense Refill  . albuterol (PROVENTIL HFA;VENTOLIN HFA) 108 (90 Base) MCG/ACT inhaler Inhale 2 puffs into the lungs every 6 (six) hours as needed for wheezing or shortness of breath.    . Biotin 5000 MCG CAPS Take 1 capsule by mouth daily.    . Cholecalciferol (VITAMIN D3) 50 MCG (2000 UT) TABS Take 2,000 Units by mouth daily at 12 noon.    . clopidogrel (PLAVIX) 75 MG tablet Take 75 mg by mouth daily.    . Coenzyme Q10 100 MG TABS Take 100 mg by mouth daily at 12 noon.     .  colchicine 0.6 MG tablet Take 0.6-1.2 mg by mouth daily as needed (for gout attack). 1.2mg  at onset of gout attack, then 0.6mg  an hour later. Next day 0.6mg  twice a day, then 0.6mg  daily until gout pain subsides.     Marland Kitchen estradiol (ESTRACE) 0.1 MG/GM vaginal cream Place 1 Applicatorful vaginally 3 (three) times a week.    . ezetimibe (ZETIA) 10 MG tablet TAKE 1 TABLET BY MOUTH DAILY 90 tablet 1  . febuxostat (ULORIC) 40 MG tablet Take 40 mg by mouth every evening.     . ferrous sulfate 325 (65 FE) MG EC tablet Take 325 mg by mouth 2 (two) times daily.     . folic acid (FOLVITE) A999333 MCG tablet Take 400 mcg by mouth daily at 12 noon.     . furosemide (LASIX) 20 MG tablet Take 20-40 mg by mouth See admin instructions. Take 2 tablets (40 mg) by mouth daily in the morning & 1 tablet (20 mg) by mouth at lunch    . Glucosamine-Chondroitin (OSTEO BI-FLEX REGULAR STRENGTH PO) Take 1 tablet by mouth daily at 12 noon.    . isosorbide mononitrate (IMDUR) 30 MG 24 hr tablet TAKE 1 TABLET BY MOUTH DAILY. 90 tablet 1  . levothyroxine (SYNTHROID, LEVOTHROID) 50 MCG tablet Take 50 mcg by mouth daily before breakfast.    . Multiple  Vitamins-Minerals (OCUVITE ADULT 50+ PO) Take 1 tablet by mouth daily at 12 noon.     . nitroGLYCERIN (NITROSTAT) 0.4 MG SL tablet Place 0.4 mg under the tongue every 5 (five) minutes as needed for chest pain.    Marland Kitchen omega-3 acid ethyl esters (LOVAZA) 1 g capsule TAKE 1 CAPSULE BY MOUTH DAILY AT 12 NOON. 90 capsule 1  . pantoprazole (PROTONIX) 40 MG tablet TAKE 1 TABLET BY MOUTH DAILY. 90 tablet 2  . polyethylene glycol powder (GLYCOLAX/MIRALAX) powder Take 17 g by mouth every other day. Alternating between miralax and benefiber    . pravastatin (PRAVACHOL) 40 MG tablet Take 40 mg by mouth every evening.     . Probiotic Product (PRO-BIOTIC BLEND PO) Take 1 capsule by mouth daily at 12 noon.     . ranolazine (RANEXA) 1000 MG SR tablet TAKE 1 TABLET BY MOUTH 2 TIMES DAILY. 180 tablet 1  .  traMADol (ULTRAM) 50 MG tablet Take 50 mg by mouth every 12 (twelve) hours as needed for moderate pain.     . vitamin B-12 (CYANOCOBALAMIN) 1000 MCG tablet Take 1,000 mcg by mouth daily at 12 noon.    . Wheat Dextrin (BENEFIBER PO) Take 1 Dose by mouth every other day. Alternating between benefiber & miralax    . zolpidem (AMBIEN) 10 MG tablet Take 10 mg by mouth at bedtime as needed for sleep.     Current Facility-Administered Medications  Medication Dose Route Frequency Provider Last Rate Last Admin  . 0.9 %  sodium chloride infusion  500 mL Intravenous Once Jackquline Denmark, MD        Allergies  Allergen Reactions  . Ciprofloxacin Other (See Comments)    Achilles tendon pain  . Statins Other (See Comments)    Joint pain    Past Medical History:  Diagnosis Date  . Acute ischemic stroke ([place])   . Acute metabolic encephalopathy   . Acute on chronic systolic (congestive) heart failure ([place])   . Atrial fibrillation ([place])   . Chronic anemia   . Chronic kidney disease (CKD), stage III (moderate)   . Diabetes mellitus without complication ([place])   . Diverticulosis   . Gastroesophageal reflux   . History of cardiac monitoring 02/23/2018   monitor inserted  . Hyperlipidemia   . Hypertensive heart disease with heart failure ([place])   . Myocardial infarction ([place])   . OSA on CPAP   . Renal failure, chronic, stage 3 (moderate)   . Systolic heart failure ([place])   . Thyroid disease     There were no vitals taken for this visit.   Dyslipidemia Had long discussion with patient about further options.  Reviewed information about Repatha as well as bempedoic acid.  Discussed mechanism of action, side effects and expected LDL changes.  Patient willing to start Repatha at this time.  Will also have her increase her lovaza prescription from 1 capsule daily to 2, as her triglycerides are slightly elevated at 188.  All questions were answered.  She will repeat labs in 3 months.  At that time if her  LDL is < 40-50 we can consider cutting out ezetimibe to decrease her pill burden.     Tommy Medal PharmD CPP [facility] [address] [place], [place] [address] [phone]

## 2019-03-14 ENCOUNTER — Telehealth: Payer: Self-pay

## 2019-03-14 MED ORDER — REPATHA SURECLICK 140 MG/ML ~~LOC~~ SOAJ: 140.0000 mg | SUBCUTANEOUS | 11 refills | Status: DC

## 2019-03-14 NOTE — Telephone Encounter (Signed)
Called and spoke w/pt regarding the approval of the repatha, rx sent, instructed the pt to call if unaffordable, pt voiced understanding.

## 2019-03-20 ENCOUNTER — Other Ambulatory Visit: Payer: Self-pay | Admitting: Cardiology

## 2019-03-20 ENCOUNTER — Other Ambulatory Visit (HOSPITAL_COMMUNITY): Payer: Medicare Other

## 2019-03-20 DIAGNOSIS — H2512 Age-related nuclear cataract, left eye: Secondary | ICD-10-CM | POA: Diagnosis not present

## 2019-03-21 DIAGNOSIS — H2511 Age-related nuclear cataract, right eye: Secondary | ICD-10-CM | POA: Diagnosis not present

## 2019-03-27 ENCOUNTER — Ambulatory Visit (INDEPENDENT_AMBULATORY_CARE_PROVIDER_SITE_OTHER): Payer: Medicare Other | Admitting: *Deleted

## 2019-03-27 DIAGNOSIS — R55 Syncope and collapse: Secondary | ICD-10-CM

## 2019-03-27 LAB — CUP PACEART REMOTE DEVICE CHECK
Date Time Interrogation Session: 20210222003805
Implantable Pulse Generator Implant Date: 20200122

## 2019-03-27 NOTE — Progress Notes (Signed)
ILR Remote 

## 2019-04-04 ENCOUNTER — Ambulatory Visit: Payer: Medicare Other

## 2019-04-10 DIAGNOSIS — H2511 Age-related nuclear cataract, right eye: Secondary | ICD-10-CM | POA: Diagnosis not present

## 2019-04-13 DIAGNOSIS — E039 Hypothyroidism, unspecified: Secondary | ICD-10-CM | POA: Diagnosis not present

## 2019-04-13 DIAGNOSIS — E785 Hyperlipidemia, unspecified: Secondary | ICD-10-CM | POA: Diagnosis not present

## 2019-04-13 DIAGNOSIS — N184 Chronic kidney disease, stage 4 (severe): Secondary | ICD-10-CM | POA: Diagnosis not present

## 2019-04-13 DIAGNOSIS — E1122 Type 2 diabetes mellitus with diabetic chronic kidney disease: Secondary | ICD-10-CM | POA: Diagnosis not present

## 2019-04-14 ENCOUNTER — Other Ambulatory Visit: Payer: Self-pay | Admitting: Pharmacist Clinician (PhC)/ Clinical Pharmacy Specialist

## 2019-04-14 MED ORDER — OMEGA-3-ACID ETHYL ESTERS 1 G PO CAPS: 2.0000 g | ORAL_CAPSULE | Freq: Every day | ORAL | 5 refills | Status: DC

## 2019-04-20 DIAGNOSIS — E559 Vitamin D deficiency, unspecified: Secondary | ICD-10-CM | POA: Diagnosis not present

## 2019-04-20 DIAGNOSIS — I129 Hypertensive chronic kidney disease with stage 1 through stage 4 chronic kidney disease, or unspecified chronic kidney disease: Secondary | ICD-10-CM | POA: Diagnosis not present

## 2019-04-20 DIAGNOSIS — N184 Chronic kidney disease, stage 4 (severe): Secondary | ICD-10-CM | POA: Diagnosis not present

## 2019-04-20 DIAGNOSIS — E1122 Type 2 diabetes mellitus with diabetic chronic kidney disease: Secondary | ICD-10-CM | POA: Diagnosis not present

## 2019-04-21 ENCOUNTER — Other Ambulatory Visit (HOSPITAL_COMMUNITY)
Admission: RE | Admit: 2019-04-21 | Discharge: 2019-04-21 | Disposition: A | Discharge status: Home / Self Care | Payer: Medicare Other | Source: Ambulatory Visit | Attending: Cardiology | Admitting: Cardiology

## 2019-04-21 DIAGNOSIS — E1122 Type 2 diabetes mellitus with diabetic chronic kidney disease: Secondary | ICD-10-CM | POA: Diagnosis not present

## 2019-04-21 DIAGNOSIS — Z20822 Contact with and (suspected) exposure to covid-19: Secondary | ICD-10-CM | POA: Diagnosis not present

## 2019-04-21 DIAGNOSIS — K219 Gastro-esophageal reflux disease without esophagitis: Secondary | ICD-10-CM | POA: Diagnosis not present

## 2019-04-21 DIAGNOSIS — Z888 Allergy status to other drugs, medicaments and biological substances status: Secondary | ICD-10-CM | POA: Diagnosis not present

## 2019-04-21 DIAGNOSIS — G4733 Obstructive sleep apnea (adult) (pediatric): Secondary | ICD-10-CM | POA: Diagnosis not present

## 2019-04-21 DIAGNOSIS — I480 Paroxysmal atrial fibrillation: Secondary | ICD-10-CM | POA: Diagnosis not present

## 2019-04-21 DIAGNOSIS — E785 Hyperlipidemia, unspecified: Secondary | ICD-10-CM | POA: Diagnosis not present

## 2019-04-21 DIAGNOSIS — Z87891 Personal history of nicotine dependence: Secondary | ICD-10-CM | POA: Diagnosis not present

## 2019-04-21 DIAGNOSIS — I5022 Chronic systolic (congestive) heart failure: Secondary | ICD-10-CM | POA: Diagnosis not present

## 2019-04-21 DIAGNOSIS — I130 Hypertensive heart and chronic kidney disease with heart failure and stage 1 through stage 4 chronic kidney disease, or unspecified chronic kidney disease: Secondary | ICD-10-CM | POA: Diagnosis not present

## 2019-04-21 DIAGNOSIS — I252 Old myocardial infarction: Secondary | ICD-10-CM | POA: Diagnosis not present

## 2019-04-21 DIAGNOSIS — N183 Chronic kidney disease, stage 3 unspecified: Secondary | ICD-10-CM | POA: Diagnosis not present

## 2019-04-21 DIAGNOSIS — R55 Syncope and collapse: Secondary | ICD-10-CM | POA: Diagnosis not present

## 2019-04-21 DIAGNOSIS — Z886 Allergy status to analgesic agent status: Secondary | ICD-10-CM | POA: Diagnosis not present

## 2019-04-21 DIAGNOSIS — Z881 Allergy status to other antibiotic agents status: Secondary | ICD-10-CM | POA: Diagnosis not present

## 2019-04-21 DIAGNOSIS — Z8673 Personal history of transient ischemic attack (TIA), and cerebral infarction without residual deficits: Secondary | ICD-10-CM | POA: Diagnosis not present

## 2019-04-21 DIAGNOSIS — E079 Disorder of thyroid, unspecified: Secondary | ICD-10-CM | POA: Diagnosis not present

## 2019-04-21 DIAGNOSIS — I442 Atrioventricular block, complete: Secondary | ICD-10-CM | POA: Diagnosis not present

## 2019-04-21 LAB — SARS CORONAVIRUS 2 (TAT 6-24 HRS): SARS Coronavirus 2: NEGATIVE

## 2019-04-24 ENCOUNTER — Ambulatory Visit (HOSPITAL_COMMUNITY): Payer: Medicare Other

## 2019-04-24 ENCOUNTER — Ambulatory Visit (HOSPITAL_COMMUNITY)
Admission: RE | Admit: 2019-04-24 | Discharge: 2019-04-24 | Disposition: A | Discharge status: Home / Self Care | Payer: Medicare Other | Attending: Cardiology | Admitting: Cardiology

## 2019-04-24 ENCOUNTER — Encounter (HOSPITAL_COMMUNITY)
Admission: RE | Disposition: A | Discharge status: Home / Self Care | Payer: Medicare Other | Source: Home / Self Care | Attending: Cardiology

## 2019-04-24 ENCOUNTER — Other Ambulatory Visit: Payer: Self-pay

## 2019-04-24 DIAGNOSIS — E785 Hyperlipidemia, unspecified: Secondary | ICD-10-CM | POA: Diagnosis not present

## 2019-04-24 DIAGNOSIS — I5022 Chronic systolic (congestive) heart failure: Secondary | ICD-10-CM | POA: Insufficient documentation

## 2019-04-24 DIAGNOSIS — Z881 Allergy status to other antibiotic agents status: Secondary | ICD-10-CM | POA: Diagnosis not present

## 2019-04-24 DIAGNOSIS — E1122 Type 2 diabetes mellitus with diabetic chronic kidney disease: Secondary | ICD-10-CM | POA: Diagnosis not present

## 2019-04-24 DIAGNOSIS — Z886 Allergy status to analgesic agent status: Secondary | ICD-10-CM | POA: Insufficient documentation

## 2019-04-24 DIAGNOSIS — I441 Atrioventricular block, second degree: Secondary | ICD-10-CM | POA: Diagnosis not present

## 2019-04-24 DIAGNOSIS — E079 Disorder of thyroid, unspecified: Secondary | ICD-10-CM | POA: Insufficient documentation

## 2019-04-24 DIAGNOSIS — G4733 Obstructive sleep apnea (adult) (pediatric): Secondary | ICD-10-CM | POA: Insufficient documentation

## 2019-04-24 DIAGNOSIS — I442 Atrioventricular block, complete: Secondary | ICD-10-CM | POA: Insufficient documentation

## 2019-04-24 DIAGNOSIS — N183 Chronic kidney disease, stage 3 unspecified: Secondary | ICD-10-CM | POA: Diagnosis not present

## 2019-04-24 DIAGNOSIS — Z87891 Personal history of nicotine dependence: Secondary | ICD-10-CM | POA: Insufficient documentation

## 2019-04-24 DIAGNOSIS — Z20822 Contact with and (suspected) exposure to covid-19: Secondary | ICD-10-CM | POA: Diagnosis not present

## 2019-04-24 DIAGNOSIS — I130 Hypertensive heart and chronic kidney disease with heart failure and stage 1 through stage 4 chronic kidney disease, or unspecified chronic kidney disease: Secondary | ICD-10-CM | POA: Diagnosis not present

## 2019-04-24 DIAGNOSIS — R55 Syncope and collapse: Secondary | ICD-10-CM | POA: Insufficient documentation

## 2019-04-24 DIAGNOSIS — I252 Old myocardial infarction: Secondary | ICD-10-CM | POA: Insufficient documentation

## 2019-04-24 DIAGNOSIS — Z950 Presence of cardiac pacemaker: Secondary | ICD-10-CM | POA: Diagnosis not present

## 2019-04-24 DIAGNOSIS — Z8673 Personal history of transient ischemic attack (TIA), and cerebral infarction without residual deficits: Secondary | ICD-10-CM | POA: Diagnosis not present

## 2019-04-24 DIAGNOSIS — Z888 Allergy status to other drugs, medicaments and biological substances status: Secondary | ICD-10-CM | POA: Diagnosis not present

## 2019-04-24 DIAGNOSIS — K219 Gastro-esophageal reflux disease without esophagitis: Secondary | ICD-10-CM | POA: Diagnosis not present

## 2019-04-24 DIAGNOSIS — I480 Paroxysmal atrial fibrillation: Secondary | ICD-10-CM | POA: Diagnosis not present

## 2019-04-24 DIAGNOSIS — Z95818 Presence of other cardiac implants and grafts: Secondary | ICD-10-CM

## 2019-04-24 HISTORY — PX: LOOP RECORDER REMOVAL: EP1215

## 2019-04-24 HISTORY — PX: PACEMAKER IMPLANT: EP1218

## 2019-04-24 LAB — BASIC METABOLIC PANEL
Anion gap: 13 (ref 5–15)
BUN: 48 mg/dL — ABNORMAL HIGH (ref 8–23)
CO2: 26 mmol/L (ref 22–32)
Calcium: 10.2 mg/dL (ref 8.9–10.3)
Chloride: 102 mmol/L (ref 98–111)
Creatinine, Ser: 2.03 mg/dL — ABNORMAL HIGH (ref 0.44–1.00)
GFR calc Af Amer: 27 mL/min — ABNORMAL LOW (ref >60)
GFR calc non Af Amer: 23 mL/min — ABNORMAL LOW (ref >60)
Glucose, Bld: 122 mg/dL — ABNORMAL HIGH (ref 70–99)
Potassium: 4.3 mmol/L (ref 3.5–5.1)
Sodium: 141 mmol/L (ref 135–145)

## 2019-04-24 LAB — CBC
HCT: 39 % (ref 36.0–46.0)
Hemoglobin: 12.5 g/dL (ref 12.0–15.0)
MCH: 32 pg (ref 26.0–34.0)
MCHC: 32.1 g/dL (ref 30.0–36.0)
MCV: 99.7 fL (ref 80.0–100.0)
Platelets: 217 10*3/uL (ref 150–400)
RBC: 3.91 MIL/uL (ref 3.87–5.11)
RDW: 12 % (ref 11.5–15.5)
WBC: 6.3 10*3/uL (ref 4.0–10.5)
nRBC: 0 % (ref 0.0–0.2)

## 2019-04-24 LAB — GLUCOSE, CAPILLARY
Glucose-Capillary: 142 mg/dL — ABNORMAL HIGH (ref 70–99)
Glucose-Capillary: 103 mg/dL — ABNORMAL HIGH (ref 70–99)

## 2019-04-24 SURGERY — PACEMAKER IMPLANT

## 2019-04-24 MED ORDER — SODIUM CHLORIDE 0.9 % IV SOLN
INTRAVENOUS | Status: AC
  Filled 2019-04-24: qty 2

## 2019-04-24 MED ORDER — CHLORHEXIDINE GLUCONATE 4 % EX LIQD: 4.0000 "application " | Freq: Once | CUTANEOUS | Status: DC

## 2019-04-24 MED ORDER — CEFAZOLIN SODIUM-DEXTROSE 2-4 GM/100ML-% IV SOLN
2.0000 g | INTRAVENOUS | Status: AC
  Administered 2019-04-24: 2 g via INTRAVENOUS

## 2019-04-24 MED ORDER — LIDOCAINE HCL 1 % IJ SOLN
INTRAMUSCULAR | Status: AC
  Filled 2019-04-24: qty 20

## 2019-04-24 MED ORDER — FENTANYL CITRATE (PF) 100 MCG/2ML IJ SOLN
INTRAMUSCULAR | Status: AC
  Filled 2019-04-24: qty 2

## 2019-04-24 MED ORDER — FENTANYL CITRATE (PF) 100 MCG/2ML IJ SOLN
INTRAMUSCULAR | Status: DC | PRN
  Administered 2019-04-24 (×2): 25 ug via INTRAVENOUS
  Administered 2019-04-24: 12.5 ug via INTRAVENOUS

## 2019-04-24 MED ORDER — ONDANSETRON HCL 4 MG/2ML IJ SOLN: 4.0000 mg | Freq: Four times a day (QID) | INTRAMUSCULAR | Status: DC | PRN

## 2019-04-24 MED ORDER — LIDOCAINE HCL 1 % IJ SOLN
INTRAMUSCULAR | Status: AC
  Filled 2019-04-24: qty 60

## 2019-04-24 MED ORDER — MIDAZOLAM HCL 5 MG/5ML IJ SOLN
INTRAMUSCULAR | Status: AC
  Filled 2019-04-24: qty 5

## 2019-04-24 MED ORDER — LIDOCAINE HCL (PF) 1 % IJ SOLN
INTRAMUSCULAR | Status: DC | PRN
  Administered 2019-04-24: 80 mL

## 2019-04-24 MED ORDER — ACETAMINOPHEN 325 MG PO TABS
325.0000 mg | ORAL_TABLET | ORAL | Status: DC | PRN
  Administered 2019-04-24: 650 mg via ORAL

## 2019-04-24 MED ORDER — SODIUM CHLORIDE 0.9 % IV SOLN
80.0000 mg | INTRAVENOUS | Status: AC
  Administered 2019-04-24: 80 mg
  Filled 2019-04-24: qty 2

## 2019-04-24 MED ORDER — CEFAZOLIN SODIUM-DEXTROSE 1-4 GM/50ML-% IV SOLN
1.0000 g | Freq: Three times a day (TID) | INTRAVENOUS | Status: DC
  Administered 2019-04-24: 1 g via INTRAVENOUS
  Filled 2019-04-24 (×2): qty 50

## 2019-04-24 MED ORDER — SODIUM CHLORIDE 0.9 % IV SOLN: INTRAVENOUS | Status: DC

## 2019-04-24 MED ORDER — ACETAMINOPHEN 325 MG PO TABS
ORAL_TABLET | ORAL | Status: AC
  Filled 2019-04-24: qty 2

## 2019-04-24 MED ORDER — MIDAZOLAM HCL 5 MG/5ML IJ SOLN
INTRAMUSCULAR | Status: DC | PRN
  Administered 2019-04-24 (×3): 1 mg via INTRAVENOUS

## 2019-04-24 MED ORDER — HEPARIN (PORCINE) IN NACL 1000-0.9 UT/500ML-% IV SOLN
INTRAVENOUS | Status: DC | PRN
  Administered 2019-04-24: 500 mL

## 2019-04-24 MED ORDER — HEPARIN (PORCINE) IN NACL 1000-0.9 UT/500ML-% IV SOLN
INTRAVENOUS | Status: AC
  Filled 2019-04-24: qty 500

## 2019-04-24 MED ORDER — CEFAZOLIN SODIUM-DEXTROSE 2-4 GM/100ML-% IV SOLN
INTRAVENOUS | Status: AC
  Filled 2019-04-24: qty 100

## 2019-04-24 SURGICAL SUPPLY — 9 items
CABLE SURGICAL S-101-97-12 (CABLE) ×2 IMPLANT
IPG PACE AZUR XT DR MRI W1DR01 (Pacemaker) ×1 IMPLANT
LEAD CAPSURE NOVUS 5076-52CM (Lead) ×2 IMPLANT
LEAD CAPSURE NOVUS 5076-58CM (Lead) ×2 IMPLANT
PACE AZURE XT DR MRI W1DR01 (Pacemaker) ×2 IMPLANT
PACK LOOP INSERTION (CUSTOM PROCEDURE TRAY) ×2 IMPLANT
PAD PRO RADIOLUCENT 2001M-C (PAD) ×2 IMPLANT
SHEATH 7FR PRELUDE SNAP 13 (SHEATH) ×4 IMPLANT
TRAY PACEMAKER INSERTION (PACKS) ×2 IMPLANT

## 2019-04-24 NOTE — Discharge Instructions (Signed)
After Your Pacemaker   . You have a [employer]onic Pacemaker  ACTIVITY . Do not lift your arm above shoulder height for 1 week after your procedure. After 7 days, you may progress as below.     Monday May 01, 2019  Tuesday May 02, 2019 Wednesday May 03, 2019 Thursday May 04, 2019   . Do not lift, push, pull, or carry anything over 10 pounds with the affected arm until 6 weeks (Monday Jun 05, 2019 ) after your procedure.   . Do NOT DRIVE until you have been seen for your wound check, or as long as instructed by your healthcare provider.   . Ask your healthcare provider when you can go back to work   INCISION/Dressing . If you are on a blood thinner such as Coumadin, Xarelto, Eliquis, Plavix, or Pradaxa please confirm with your provider when this should be resumed.   . Monitor your Pacemaker site for redness, swelling, and drainage. Call the device clinic at [phone] if you experience these symptoms or fever/chills.  . If your incision is sealed with Steri-strips or staples, you may shower 10 days after your procedure or when told by your provider. Do not remove the steri-strips or let the shower hit directly on your site. You may wash around your site with soap and water.    Marland Kitchen Avoid lotions, ointments, or perfumes over your incision until it is well-healed.  . You may use a hot tub or a pool AFTER your wound check appointment if the incision is completely closed.  Marland Kitchen PAcemaker Alerts:  Some alerts are vibratory and others beep. These are NOT emergencies. Please call our office to let us know. If this occurs at night or on weekends, it can wait until the next business day. Send a remote transmission.  . If your device is capable of reading fluid status (for heart failure), you will be offered monthly monitoring to review this with you.   DEVICE MANAGEMENT . Remote monitoring is used to monitor your pacemaker from home. This monitoring is scheduled every 91 days by our office. It  allows [place] to keep an eye on the functioning of your device to ensure it is working properly. You will routinely see your Electrophysiologist annually (more often if necessary).   . You should receive your ID card for your new device in 4-8 weeks. Keep this card with you at all times once received. Consider wearing a medical alert bracelet or necklace.  . Your Pacemaker may be MRI compatible. This will be discussed at your next office visit/wound check.  You should avoid contact with strong electric or magnetic fields.    Do not use amateur (ham) radio equipment or electric (arc) welding torches. MP3 player headphones with magnets should not be used. Some devices are safe to use if held at least 12 inches (30 cm) from your Pacemaker. These include power tools, lawn mowers, and speakers. If you are unsure if something is safe to use, ask your health care provider.   When using your cell phone, hold it to the ear that is on the opposite side from the Pacemaker. Do not leave your cell phone in a pocket over the Pacemaker.   You may safely use electric blankets, heating pads, computers, and microwave ovens.  Call the office right away if:  You have chest pain.  You feel more short of breath than you have felt before.  You feel more light-headed than you have felt before.  Your  incision starts to open up.  This information is not intended to replace advice given to you by your health care provider. Make sure you discuss any questions you have with your health care provider.

## 2019-04-24 NOTE — H&P (Signed)
Electrophysiology Office Note   Date:  04/24/2019   ID:  Amanda [name], DOB 1941/01/26, MRN [id number]  PCP:  Street, Sharon Mt, MD  Cardiologist: Agustin Cree Primary Electrophysiologist:  Victorine Mcnee Meredith Leeds, MD    No chief complaint on file.    History of Present Illness: Amanda [name] is a 79 y.o. female who is being seen today for the evaluation of atrial fibrillation, syncope at the request of Jenne Campus. Presenting today for electrophysiology evaluation.  She has a past history significant for ischemic stroke, atrial fibrillation, CKD stage III, diabetes hypertension OSA on CPAP.  She has a watchman implanted due to recurrent GI bleeds.  She had an episode of syncope.  She was sleeping well, wanting to go to the restroom.  She was sitting on the edge trying to get up and ended falling down.  It was thought this may be due to orthostasis.  He had an echo done that showed a normal ejection fraction and a cardiac monitor which showed PACs.  She has had multiple episodes of syncope and is now status post Linq monitor.  Today, denies symptoms of palpitations, chest pain, shortness of breath, orthopnea, PND, lower extremity edema, claudication, dizziness, presyncope, syncope, bleeding, or neurologic sequela. The patient is tolerating medications without difficulties. Plan for pacemaker today for heart block and syncope.    Past Medical History:  Diagnosis Date  . Acute ischemic stroke ([place])   . Acute metabolic encephalopathy   . Acute on chronic systolic (congestive) heart failure ([place])   . Atrial fibrillation ([place])   . Chronic anemia   . Chronic kidney disease (CKD), stage III (moderate)   . Diabetes mellitus without complication ([place])   . Diverticulosis   . Gastroesophageal reflux   . History of cardiac monitoring 02/23/2018   monitor inserted  . Hyperlipidemia   . Hypertensive heart disease with heart failure ([place])   . Myocardial infarction ([place])   . OSA on CPAP    . Renal failure, chronic, stage 3 (moderate)   . Systolic heart failure ([place])   . Thyroid disease    Past Surgical History:  Procedure Laterality Date  . CARDIAC CATHETERIZATION    . COLONOSCOPY  11/21/2014   Moderate predominantly sigmoid diverticulosis. Otherwise noraml collonscopy to TI.   Marland Kitchen CORONARY ANGIOPLASTY WITH STENT PLACEMENT  08/2016  . EXCISIONAL HEMORRHOIDECTOMY    . LEFT ATRIAL APPENDAGE OCCLUSION  11/2016   in [place]  . LOOP RECORDER INSERTION N/A 02/23/2018   Procedure: LOOP RECORDER INSERTION;  Surgeon: Constance Haw, MD;  Location: [facility];  Service: Cardiovascular;  Laterality: N/A;  . NOSE SURGERY       Current Facility-Administered Medications  Medication Dose Route Frequency Provider Last Rate Last Admin  . 0.9 %  sodium chloride infusion   Intravenous Continuous Constance Haw, MD 50 mL/hr at 04/24/19 0615 New Bag at 04/24/19 0615  . ceFAZolin (ANCEF) IVPB 2g/100 mL premix  2 g Intravenous On Call Latonya Knight Hassell Done, MD      . chlorhexidine (HIBICLENS) 4 % liquid 4 application  4 application Topical Once Collins Dimaria Hassell Done, MD      . gentamicin (GARAMYCIN) 80 mg in sodium chloride 0.9 % 500 mL irrigation  80 mg Irrigation On Call Trystin Terhune, Ocie Doyne, MD        Allergies:   Ciprofloxacin, Contrast media [iodinated diagnostic agents], Nsaids, and Statins   Social History:  The patient  reports that she has  quit smoking. She has never used smokeless tobacco. She reports that she does not drink alcohol or use drugs.   Family History:  The patient's family history includes Breast cancer in her mother; Cancer in her maternal grandfather; Diabetes in her sister; Heart attack in her brother, maternal uncle, and sister; Hypertension in her father; Prostate cancer in her father; Stroke in her father.    ROS:  Please see the history of present illness.   Otherwise, review of systems is positive for none.   All other systems are  reviewed and negative.   PHYSICAL EXAM: VS:  BP (!) 189/71   Pulse (!) 59   Temp 98.2 F (36.8 C) (Skin)   Resp 16   Ht 5\' 3"  (1.6 m)   Wt 77.7 kg   SpO2 100%   BMI 30.33 kg/m  , BMI Body mass index is 30.33 kg/m. GEN: Well nourished, well developed, in no acute distress  HEENT: normal  Neck: no JVD, carotid bruits, or masses Cardiac: RRR; no murmurs, rubs, or gallops,no edema  Respiratory:  clear to auscultation bilaterally, normal work of breathing GI: soft, nontender, nondistended, + BS MS: no deformity or atrophy  Skin: warm and dry Neuro:  Strength and sensation are intact Psych: euthymic mood, full affect  Recent Labs: 04/24/2019: BUN 48; Creatinine, Ser 2.03; Hemoglobin 12.5; Platelets 217; Potassium 4.3; Sodium 141    Lipid Panel     Component Value Date/Time   CHOL 196 02/13/2019 1557   TRIG 188 (H) 02/13/2019 1557   HDL 58 02/13/2019 1557   CHOLHDL 3.4 02/13/2019 1557   LDLCALC 106 (H) 02/13/2019 1557     Wt Readings from Last 3 Encounters:  04/24/19 77.7 kg  03/06/19 80.3 kg  02/13/19 80.7 kg      Other studies Reviewed: Additional studies/ records that were reviewed today include: TTE 12/22/2017 Review of the above records today demonstrates:  - Left ventricle: The cavity size was normal. Wall thickness was   increased in a pattern of moderate LVH. Systolic function was   normal. The estimated ejection fraction was in the range of 60%   to 65%. Wall motion was normal; there were no regional wall   motion abnormalities. Features are consistent with a pseudonormal   left ventricular filling pattern, with concomitant abnormal   relaxation and increased filling pressure (grade 2 diastolic   dysfunction). - Aortic valve: There was mild stenosis. Valve area (VTI): 1.57   cm^2. Valve area (Vmax): 1.45 cm^2. Valve area (Vmean): 1.57   cm^2. - Mitral valve: There was mild regurgitation. Valve area by   pressure half-time: 2.34 cm^2. - Left atrium: The  atrium was moderately dilated.  Cardiac monitor 10/11/2017-personally reviewed Baseline rhythm: Sinus rhythm Atrial arrhythmia:.  Of APCs felt as fluttering or palpitations.  No clear-cut sustained arrhythmia.  No clear-cut atrial fibrillation identified Symptoms: A trigger events some because of palpitations patient got some APCs during those episodes.   ASSESSMENT AND PLAN:  1.  Paroxysmal atrial fibrillation: Status post watchman and thus not anticoagulated.  CHA2DS2-VASc 7.    2.  OSA: CPAP compliance encouraged  3.  Hypertension: Currently well controlled  4.  Syncope with complete heart block:   Cruz Condon Swarthout has presented today for surgery, with the diagnosis of syncope with heart block.  The various methods of treatment have been discussed with the patient and family. After consideration of risks, benefits and other options for treatment, the patient has consented to  Procedure(s):  Pacemaker implant as a surgical intervention .  Risks include but not limited to bleeding, tamponade, infection, pneumothorax, among others. The patient's history has been reviewed, patient examined, no change in status, stable for surgery.  I have reviewed the patient's chart and labs.  Questions were answered to the patient's satisfaction.    Amanda Snare Curt Bears, MD 04/24/2019 7:14 AM

## 2019-04-24 NOTE — Progress Notes (Signed)
Ok to d/c home now per Dr Curt Bears

## 2019-04-26 ENCOUNTER — Telehealth: Payer: Self-pay | Admitting: Cardiology

## 2019-04-26 ENCOUNTER — Encounter: Payer: Self-pay | Admitting: Cardiology

## 2019-04-26 NOTE — Telephone Encounter (Signed)
Pt called because she had her loop monitor taken out and her pacemaker put in on Monday. She took the bandage off today and the steri strip came off from the area where the loop recorder was removed. She said it bled a little when the steri strip came off.  She wanted to know if that was OK or if she needed to put another steri strip back on. Please advise

## 2019-04-26 NOTE — Telephone Encounter (Signed)
Spoke with pt.  She report site where steri-strip came off is not actively bleeding now but there was fresh blood there this morning.  She has covered with non-occlusive dressing.  She does not have steri-strips at home to replace it with, she will come to device clinic appt tomorrow for wound check.

## 2019-04-27 ENCOUNTER — Ambulatory Visit (INDEPENDENT_AMBULATORY_CARE_PROVIDER_SITE_OTHER): Payer: Medicare Other | Admitting: *Deleted

## 2019-04-27 ENCOUNTER — Other Ambulatory Visit: Payer: Self-pay

## 2019-04-27 DIAGNOSIS — R55 Syncope and collapse: Secondary | ICD-10-CM

## 2019-04-27 NOTE — Progress Notes (Signed)
Presents to clinic with complaint of steri-strips at  [facility] explant detached just 3 days post explant. Wound site assessed. Dressing removed , dime sized area of dried blood noted on dressing. No bleeding at [facility] explant wound site. Steri strip at distal corner of explant site missing. Steri-strip reapplied to distal corner of explant incision. Education done on applying direct pressure to wound site. Pacemaker wound site with steri-strips intact with no bleeding, drainage, redness or increased edema reported from implant date.

## 2019-04-27 NOTE — Patient Instructions (Signed)
If you have bleeding at site of loop recorder removal apply direct pressure with 2 fingers and call office if bleeding does not stop after 10 minutes. Call the office if you have any questions or concerns. [phone]

## 2019-05-09 ENCOUNTER — Other Ambulatory Visit: Payer: Self-pay

## 2019-05-09 ENCOUNTER — Ambulatory Visit (INDEPENDENT_AMBULATORY_CARE_PROVIDER_SITE_OTHER): Payer: Medicare Other | Admitting: *Deleted

## 2019-05-09 DIAGNOSIS — Z950 Presence of cardiac pacemaker: Secondary | ICD-10-CM

## 2019-05-09 DIAGNOSIS — I442 Atrioventricular block, complete: Secondary | ICD-10-CM | POA: Diagnosis not present

## 2019-05-09 LAB — CUP PACEART INCLINIC DEVICE CHECK
Battery Remaining Longevity: 181 mo
Battery Voltage: 3.23 V
Brady Statistic AP VP Percent: 0.01 %
Brady Statistic AP VS Percent: 6.51 %
Brady Statistic AS VP Percent: 0.05 %
Brady Statistic AS VS Percent: 93.42 %
Brady Statistic RA Percent Paced: 6.43 %
Brady Statistic RV Percent Paced: 0.06 %
Date Time Interrogation Session: 20210406093431
Implantable Lead Implant Date: 20210322
Implantable Lead Implant Date: 20210322
Implantable Lead Location: 753859
Implantable Lead Location: 753860
Implantable Lead Model: 5076
Implantable Lead Model: 5076
Implantable Pulse Generator Implant Date: 20210322
Lead Channel Impedance Value: 323 Ohm
Lead Channel Impedance Value: 437 Ohm
Lead Channel Impedance Value: 456 Ohm
Lead Channel Impedance Value: 532 Ohm
Lead Channel Pacing Threshold Amplitude: 0.75 V
Lead Channel Pacing Threshold Amplitude: 0.75 V
Lead Channel Pacing Threshold Pulse Width: 0.4 ms
Lead Channel Pacing Threshold Pulse Width: 0.4 ms
Lead Channel Sensing Intrinsic Amplitude: 10.625 mV
Lead Channel Sensing Intrinsic Amplitude: 3.875 mV
Lead Channel Setting Pacing Amplitude: 3.5 V
Lead Channel Setting Pacing Amplitude: 3.5 V
Lead Channel Setting Pacing Pulse Width: 0.4 ms
Lead Channel Setting Sensing Sensitivity: 2 mV

## 2019-05-09 NOTE — Progress Notes (Signed)
Wound check appointment. Steri-strips removed. Wound without redness or edema. Incision edges approximated, wound well healed. Normal device function. Thresholds, sensing, and impedances consistent with implant measurements. Device programmed at 3.5V with auto capture enabled for extra safety margin until 3 month visit. Histogram distribution appropriate for patient and level of activity. No mode switches or high ventricular rates noted. Patient educated about wound care, arm mobility, lifting restrictions, and Carelink monitor. Carelink on 07/24/19 and ROV with Dr. Curt Bears on 07/31/19.

## 2019-05-15 ENCOUNTER — Other Ambulatory Visit: Payer: Self-pay | Admitting: Cardiology

## 2019-06-05 ENCOUNTER — Ambulatory Visit: Payer: Medicare Other | Admitting: Cardiology

## 2019-06-12 ENCOUNTER — Other Ambulatory Visit: Payer: Self-pay | Admitting: Cardiology

## 2019-06-23 DIAGNOSIS — E669 Obesity, unspecified: Secondary | ICD-10-CM | POA: Diagnosis not present

## 2019-06-23 DIAGNOSIS — E785 Hyperlipidemia, unspecified: Secondary | ICD-10-CM | POA: Diagnosis not present

## 2019-06-23 DIAGNOSIS — Z6831 Body mass index (BMI) 31.0-31.9, adult: Secondary | ICD-10-CM | POA: Diagnosis not present

## 2019-06-23 DIAGNOSIS — R7303 Prediabetes: Secondary | ICD-10-CM | POA: Diagnosis not present

## 2019-06-26 ENCOUNTER — Encounter: Payer: Medicare Other | Admitting: Cardiology

## 2019-06-27 DIAGNOSIS — M79672 Pain in left foot: Secondary | ICD-10-CM | POA: Diagnosis not present

## 2019-07-04 DIAGNOSIS — Z1231 Encounter for screening mammogram for malignant neoplasm of breast: Secondary | ICD-10-CM | POA: Diagnosis not present

## 2019-07-05 DIAGNOSIS — E1122 Type 2 diabetes mellitus with diabetic chronic kidney disease: Secondary | ICD-10-CM | POA: Diagnosis not present

## 2019-07-05 DIAGNOSIS — M791 Myalgia, unspecified site: Secondary | ICD-10-CM | POA: Diagnosis not present

## 2019-07-05 DIAGNOSIS — E785 Hyperlipidemia, unspecified: Secondary | ICD-10-CM | POA: Diagnosis not present

## 2019-07-05 DIAGNOSIS — N184 Chronic kidney disease, stage 4 (severe): Secondary | ICD-10-CM | POA: Diagnosis not present

## 2019-07-24 ENCOUNTER — Ambulatory Visit (INDEPENDENT_AMBULATORY_CARE_PROVIDER_SITE_OTHER): Payer: Medicare Other | Admitting: *Deleted

## 2019-07-24 DIAGNOSIS — I442 Atrioventricular block, complete: Secondary | ICD-10-CM

## 2019-07-24 LAB — CUP PACEART REMOTE DEVICE CHECK
Battery Remaining Longevity: 172 mo
Battery Voltage: 3.22 V
Brady Statistic AP VP Percent: 0.01 %
Brady Statistic AP VS Percent: 13.67 %
Brady Statistic AS VP Percent: 0.05 %
Brady Statistic AS VS Percent: 86.27 %
Brady Statistic RA Percent Paced: 13.51 %
Brady Statistic RV Percent Paced: 0.06 %
Date Time Interrogation Session: 20210620212450
Implantable Lead Implant Date: 20210322
Implantable Lead Implant Date: 20210322
Implantable Lead Location: 753859
Implantable Lead Location: 753860
Implantable Lead Model: 5076
Implantable Lead Model: 5076
Implantable Pulse Generator Implant Date: 20210322
Lead Channel Impedance Value: 323 Ohm
Lead Channel Impedance Value: 418 Ohm
Lead Channel Impedance Value: 456 Ohm
Lead Channel Impedance Value: 494 Ohm
Lead Channel Pacing Threshold Amplitude: 0.5 V
Lead Channel Pacing Threshold Amplitude: 0.625 V
Lead Channel Pacing Threshold Pulse Width: 0.4 ms
Lead Channel Pacing Threshold Pulse Width: 0.4 ms
Lead Channel Sensing Intrinsic Amplitude: 3.625 mV
Lead Channel Sensing Intrinsic Amplitude: 3.625 mV
Lead Channel Sensing Intrinsic Amplitude: 9.25 mV
Lead Channel Sensing Intrinsic Amplitude: 9.25 mV
Lead Channel Setting Pacing Amplitude: 3.5 V
Lead Channel Setting Pacing Amplitude: 3.5 V
Lead Channel Setting Pacing Pulse Width: 0.4 ms
Lead Channel Setting Sensing Sensitivity: 2 mV

## 2019-07-24 NOTE — Progress Notes (Signed)
Remote pacemaker transmission.   

## 2019-07-31 ENCOUNTER — Other Ambulatory Visit: Payer: Self-pay

## 2019-07-31 ENCOUNTER — Ambulatory Visit (INDEPENDENT_AMBULATORY_CARE_PROVIDER_SITE_OTHER): Payer: Medicare Other | Admitting: Cardiology

## 2019-07-31 ENCOUNTER — Encounter: Payer: Self-pay | Admitting: Cardiology

## 2019-07-31 VITALS — BP 116/66 | HR 59 | Ht 63.0 in | Wt 178.4 lb

## 2019-07-31 DIAGNOSIS — R55 Syncope and collapse: Secondary | ICD-10-CM

## 2019-07-31 DIAGNOSIS — I480 Paroxysmal atrial fibrillation: Secondary | ICD-10-CM | POA: Diagnosis not present

## 2019-07-31 NOTE — Patient Instructions (Addendum)
Medication Instructions:  Your physician recommends that you continue on your current medications as directed. Please refer to the Current Medication list given to you today.  *If you need a refill on your cardiac medications before your next appointment, please call your pharmacy*   Lab Work: None ordered   Testing/Procedures: None ordered   Follow-Up: Remote monitoring is used to monitor your Pacemaker of ICD from home. This monitoring reduces the number of office visits required to check your device to one time per year. It allows [place] to keep an eye on the functioning of your device to ensure it is working properly. You are scheduled for a device check from home on 10/23/2019. You may send your transmission at any time that day. If you have a wireless device, the transmission will be sent automatically. After your physician reviews your transmission, you will receive a postcard with your next transmission date.  At [facility], you and your health needs are our priority.  As part of our continuing mission to provide you with exceptional heart care, we have created designated Provider Care Teams.  These Care Teams include your primary Cardiologist (physician) and Advanced Practice Providers (APPs -  Physician Assistants and Nurse Practitioners) who all work together to provide you with the care you need, when you need it.  We recommend signing up for the patient portal called "MyChart".  Sign up information is provided on this After Visit Summary.  MyChart is used to connect with patients for Virtual Visits (Telemedicine).  Patients are able to view lab/test results, encounter notes, upcoming appointments, etc.  Non-urgent messages can be sent to your provider as well.   To learn more about what you can do with MyChart, go to [email or web address].    Your next appointment:   9 month(s)  The format for your next appointment:   In Person  Provider:   Allegra Lai, MD   Thank you  for choosing [facility]!!   Trinidad Curet, RN [phone]    Other Instructions

## 2019-07-31 NOTE — Progress Notes (Signed)
Electrophysiology Office Note   Date:  07/31/2019   ID:  Amanda [name], DOB 30-Jan-1941, MRN [id number]  PCP:  Street, Sharon Mt, MD  Cardiologist: Agustin Cree Primary Electrophysiologist:  Eleftheria Taborn Meredith Leeds, MD    No chief complaint on file.    History of Present Illness: Amanda [name] is a 79 y.o. female who is being seen today for the evaluation of atrial fibrillation, syncope at the request of Jenne Campus. Presenting today for electrophysiology evaluation.  She has a past history significant for ischemic stroke, atrial fibrillation, CKD stage III, diabetes hypertension OSA on CPAP.  She has a watchman implanted due to recurrent GI bleeds.  She had an episode of syncope.  She was sleeping well, wanting to go to the restroom.  She was sitting on the edge trying to get up and ended falling down.  It was thought this may be due to orthostasis.  He had an echo done that showed a normal ejection fraction and a cardiac monitor which showed PACs.  She had a Linq monitor implanted which showed 9-second pauses.  She is now status post Medtronic dual-chamber pacemaker implanted 04/24/2019 with explant of her Linq monitor.  Today, denies symptoms of palpitations, chest pain, shortness of breath, orthopnea, PND, lower extremity edema, claudication, dizziness, presyncope, syncope, bleeding, or neurologic sequela. The patient is tolerating medications without difficulties.  Since pacemaker was implanted she has done well.  She has had minimal episodes of atrial fibrillation.  She has not noted any further episodes of syncope.   Past Medical History:  Diagnosis Date  . Acute ischemic stroke ([place])   . Acute metabolic encephalopathy   . Acute on chronic systolic (congestive) heart failure ([place])   . Atrial fibrillation ([place])   . Chronic anemia   . Chronic kidney disease (CKD), stage III (moderate)   . Diabetes mellitus without complication ([place])   . Diverticulosis   . Gastroesophageal  reflux   . History of cardiac monitoring 02/23/2018   monitor inserted  . Hyperlipidemia   . Hypertensive heart disease with heart failure ([place])   . Myocardial infarction ([place])   . OSA on CPAP   . Renal failure, chronic, stage 3 (moderate)   . Systolic heart failure ([place])   . Thyroid disease    Past Surgical History:  Procedure Laterality Date  . CARDIAC CATHETERIZATION    . COLONOSCOPY  11/21/2014   Moderate predominantly sigmoid diverticulosis. Otherwise noraml collonscopy to TI.   Marland Kitchen CORONARY ANGIOPLASTY WITH STENT PLACEMENT  08/2016  . EXCISIONAL HEMORRHOIDECTOMY    . LEFT ATRIAL APPENDAGE OCCLUSION  11/2016   in [place]  . LOOP RECORDER INSERTION N/A 02/23/2018   Procedure: LOOP RECORDER INSERTION;  Surgeon: Constance Haw, MD;  Location: [facility];  Service: Cardiovascular;  Laterality: N/A;  . LOOP RECORDER REMOVAL N/A 04/24/2019   Procedure: LOOP RECORDER REMOVAL;  Surgeon: Constance Haw, MD;  Location: [facility];  Service: Cardiovascular;  Laterality: N/A;  . NOSE SURGERY    . PACEMAKER IMPLANT N/A 04/24/2019   Procedure: PACEMAKER IMPLANT;  Surgeon: Constance Haw, MD;  Location: [facility];  Service: Cardiovascular;  Laterality: N/A;     Current Outpatient Medications  Medication Sig Dispense Refill  . acetaminophen (TYLENOL) 500 MG tablet Take 1,000 mg by mouth every 6 (six) hours as needed for moderate pain or headache.    Marland Kitchen BESIVANCE 0.6 % SUSP Place 1 drop into the right eye 3 (  three) times daily.    . Biotin 5000 MCG CAPS Take 5,000 mcg by mouth daily at 12 noon.     . calcium carbonate (TUMS - DOSED IN MG ELEMENTAL CALCIUM) 500 MG chewable tablet Chew 1-2 tablets by mouth daily as needed for indigestion or heartburn.    . Carboxymethylcellul-Glycerin (REFRESH OPTIVE OP) Place 1 drop into both eyes 4 (four) times daily as needed (dry eyes).    . Cholecalciferol (VITAMIN D3) 50 MCG (2000 UT) TABS Take 2,000 Units by  mouth daily at 12 noon.    . clopidogrel (PLAVIX) 75 MG tablet Take 75 mg by mouth daily.    . Coenzyme Q10 100 MG TABS Take 100 mg by mouth daily at 12 noon.     . colchicine 0.6 MG tablet Take 0.6-1.2 mg by mouth See admin instructions. 1.2mg  at onset of gout attack, then 0.6mg  an hour later. Next day 0.6mg  twice a day, then 0.6mg  daily until gout pain subsides.     . DUREZOL 0.05 % EMUL Place 1 drop into the right eye See admin instructions. Instill 1 drop into the right eye twice daily for 7 days then 1 drop once daily for 7 days then stop    . estradiol (ESTRACE) 0.1 MG/GM vaginal cream Place 1 Applicatorful vaginally every Monday, Wednesday, and Friday.     . Evolocumab (REPATHA SURECLICK) 829 MG/ML SOAJ Inject 140 mg into the skin every 14 (fourteen) days. 2 pen 11  . ezetimibe (ZETIA) 10 MG tablet Take 1 tablet (10 mg total) by mouth every evening. 90 tablet 0  . febuxostat (ULORIC) 40 MG tablet Take 40 mg by mouth every evening.     . ferrous sulfate 325 (65 FE) MG EC tablet Take 325 mg by mouth 2 (two) times daily.     . folic acid (FOLVITE) 562 MCG tablet Take 800 mcg by mouth daily at 12 noon.    . furosemide (LASIX) 20 MG tablet Take 20-40 mg by mouth See admin instructions. Take 2 tablets (40 mg) by mouth daily in the morning & 1 tablet (20 mg) by mouth at noon    . Glucosamine-Chondroitin (OSTEO BI-FLEX REGULAR STRENGTH PO) Take 1 tablet by mouth daily at 12 noon.    . isosorbide mononitrate (IMDUR) 30 MG 24 hr tablet TAKE 1 TABLET BY MOUTH DAILY. (Patient taking differently: Take 30 mg by mouth daily. ) 90 tablet 1  . levothyroxine (SYNTHROID, LEVOTHROID) 50 MCG tablet Take 50 mcg by mouth daily before breakfast.    . Multiple Vitamins-Minerals (OCUVITE ADULT 50+ PO) Take 1 tablet by mouth daily at 12 noon.     . nitroGLYCERIN (NITROSTAT) 0.4 MG SL tablet Place 0.4 mg under the tongue every 5 (five) minutes as needed for chest pain.    Marland Kitchen omega-3 acid ethyl esters (LOVAZA) 1 g capsule  Take 2 capsules (2 g total) by mouth daily. (Patient taking differently: Take 2 g by mouth daily at 12 noon. ) 60 capsule 5  . pantoprazole (PROTONIX) 40 MG tablet TAKE 1 TABLET BY MOUTH DAILY. (Patient taking differently: Take 40 mg by mouth every evening. ) 90 tablet 2  . polyethylene glycol powder (GLYCOLAX/MIRALAX) powder Take 17 g by mouth every other day. Alternating between miralax and benefiber    . pravastatin (PRAVACHOL) 40 MG tablet Take 40 mg by mouth every evening.     . Probiotic Product (PRO-BIOTIC BLEND PO) Take 1 capsule by mouth daily at 12 noon.     Marland Kitchen  PROLENSA 0.07 % SOLN Place 1 drop into the right eye at bedtime.    . ranolazine (RANEXA) 1000 MG SR tablet TAKE 1 TABLET BY MOUTH 2 TIMES DAILY. 180 tablet 1  . traMADol (ULTRAM) 50 MG tablet Take 50 mg by mouth every 12 (twelve) hours as needed for moderate pain.     . vitamin B-12 (CYANOCOBALAMIN) 1000 MCG tablet Take 1,000 mcg by mouth daily at 12 noon.    . Wheat Dextrin (BENEFIBER PO) Take 1 Dose by mouth every other day. Alternating between benefiber & miralax    . zolpidem (AMBIEN) 10 MG tablet Take 10 mg by mouth at bedtime as needed for sleep.     Current Facility-Administered Medications  Medication Dose Route Frequency Provider Last Rate Last Admin  . 0.9 %  sodium chloride infusion  500 mL Intravenous Once Jackquline Denmark, MD        Allergies:   Ciprofloxacin, Contrast media [iodinated diagnostic agents], Nsaids, and Statins   Social History:  The patient  reports that she has quit smoking. She has never used smokeless tobacco. She reports that she does not drink alcohol and does not use drugs.   Family History:  The patient's family history includes Breast cancer in her mother; Cancer in her maternal grandfather; Diabetes in her sister; Heart attack in her brother, maternal uncle, and sister; Hypertension in her father; Prostate cancer in her father; Stroke in her father.   ROS:  Please see the history of present  illness.   Otherwise, review of systems is positive for none.   All other systems are reviewed and negative.   PHYSICAL EXAM: VS:  BP 116/66   Pulse (!) 59   Ht 5\' 3"  (1.6 m)   Wt 178 lb 6.4 oz (80.9 kg)   SpO2 99%   BMI 31.60 kg/m  , BMI Body mass index is 31.6 kg/m. GEN: Well nourished, well developed, in no acute distress  HEENT: normal  Neck: no JVD, carotid bruits, or masses Cardiac: RRR; no murmurs, rubs, or gallops,no edema  Respiratory:  clear to auscultation bilaterally, normal work of breathing GI: soft, nontender, nondistended, + BS MS: no deformity or atrophy  Skin: warm and dry, device site well healed Neuro:  Strength and sensation are intact Psych: euthymic mood, full affect  EKG:  EKG is ordered today. Personal review of the ekg ordered shows sinus rhythm, rate 59  Personal review of the device interrogation today. Results in [place]: 04/24/2019: BUN 48; Creatinine, Ser 2.03; Hemoglobin 12.5; Platelets 217; Potassium 4.3; Sodium 141    Lipid Panel     Component Value Date/Time   CHOL 196 02/13/2019 1557   TRIG 188 (H) 02/13/2019 1557   HDL 58 02/13/2019 1557   CHOLHDL 3.4 02/13/2019 1557   LDLCALC 106 (H) 02/13/2019 1557     Wt Readings from Last 3 Encounters:  07/31/19 178 lb 6.4 oz (80.9 kg)  04/24/19 171 lb 3.2 oz (77.7 kg)  03/06/19 177 lb 1.9 oz (80.3 kg)      Other studies Reviewed: Additional studies/ records that were reviewed today include: TTE 12/22/2017 Review of the above records today demonstrates:  - Left ventricle: The cavity size was normal. Wall thickness was   increased in a pattern of moderate LVH. Systolic function was   normal. The estimated ejection fraction was in the range of 60%   to 65%. Wall motion was normal; there were no regional wall   motion abnormalities.  Features are consistent with a pseudonormal   left ventricular filling pattern, with concomitant abnormal   relaxation and increased filling  pressure (grade 2 diastolic   dysfunction). - Aortic valve: There was mild stenosis. Valve area (VTI): 1.57   cm^2. Valve area (Vmax): 1.45 cm^2. Valve area (Vmean): 1.57   cm^2. - Mitral valve: There was mild regurgitation. Valve area by   pressure half-time: 2.34 cm^2. - Left atrium: The atrium was moderately dilated.   ASSESSMENT AND PLAN:  1.  Paroxysmal atrial fibrillation: Status post watchman and thus not anticoagulated.  CHA2DS2-VASc of 7.  Fortunately remains in sinus rhythm.  2.  OSA: CPAP compliance encouraged  3.  Hypertension: Currently well controlled  4.  Syncope with complete heart block: Long pauses noted on cardiac monitor.  She is now status post Medtronic dual-chamber pacemaker implanted 04/24/2019.  Device functioning appropriately.  No changes at this time.  Current medicines are reviewed at length with the patient today.   The patient does not have concerns regarding her medicines.  The following changes were made today:  none  Labs/ tests ordered today include:  Orders Placed This Encounter  Procedures  . EKG 12-Lead    Disposition:   FU with Bowe Sidor 9 months  Signed, Kipling Graser Meredith Leeds, MD  07/31/2019 10:51 AM     [facility] [address] [place] [address] [place] [address] [phone] (office) [phone] (fax)

## 2019-08-08 ENCOUNTER — Other Ambulatory Visit: Payer: Self-pay | Admitting: Cardiology

## 2019-08-17 ENCOUNTER — Ambulatory Visit: Payer: Medicare Other | Admitting: Cardiology

## 2019-08-24 DIAGNOSIS — H6121 Impacted cerumen, right ear: Secondary | ICD-10-CM | POA: Diagnosis not present

## 2019-08-24 DIAGNOSIS — H903 Sensorineural hearing loss, bilateral: Secondary | ICD-10-CM | POA: Diagnosis not present

## 2019-08-24 DIAGNOSIS — H9319 Tinnitus, unspecified ear: Secondary | ICD-10-CM | POA: Diagnosis not present

## 2019-08-29 ENCOUNTER — Telehealth: Payer: Self-pay

## 2019-08-30 ENCOUNTER — Telehealth: Payer: Self-pay

## 2019-08-30 NOTE — Telephone Encounter (Signed)
Notified that it is ok to use ultra sonic scaler on patient per Medtronic guidelines.

## 2019-08-30 NOTE — Telephone Encounter (Signed)
Received fax from Dental office... Seama Perio/ Milta Deiters Lutins DDS asking if pt is able to have a deep cleaning of her teeth using Ultrasonic Scaler with her Pacemaker.  I called and spoke with Marianna Fuss to clarify and there is no other cardiac clearance needed in regards to her dental treatment, no plans for invasive treatment such as any extractions. She notes there is no treatment plan in place. They are just needing information re: her Pacemaker with the deep cleaning device.   Will forward request to our device nurses for review.   [phone] Fax [phone]

## 2019-09-05 ENCOUNTER — Other Ambulatory Visit: Payer: Self-pay

## 2019-09-05 ENCOUNTER — Ambulatory Visit: Payer: Medicare Other | Admitting: Cardiology

## 2019-09-05 ENCOUNTER — Encounter: Payer: Self-pay | Admitting: Cardiology

## 2019-09-05 VITALS — BP 118/70 | HR 69 | Ht 63.0 in | Wt 178.8 lb

## 2019-09-05 DIAGNOSIS — Z95818 Presence of other cardiac implants and grafts: Secondary | ICD-10-CM

## 2019-09-05 DIAGNOSIS — E785 Hyperlipidemia, unspecified: Secondary | ICD-10-CM

## 2019-09-05 DIAGNOSIS — Z950 Presence of cardiac pacemaker: Secondary | ICD-10-CM

## 2019-09-05 DIAGNOSIS — I480 Paroxysmal atrial fibrillation: Secondary | ICD-10-CM | POA: Diagnosis not present

## 2019-09-05 DIAGNOSIS — I519 Heart disease, unspecified: Secondary | ICD-10-CM

## 2019-09-05 DIAGNOSIS — I442 Atrioventricular block, complete: Secondary | ICD-10-CM

## 2019-09-05 DIAGNOSIS — I129 Hypertensive chronic kidney disease with stage 1 through stage 4 chronic kidney disease, or unspecified chronic kidney disease: Secondary | ICD-10-CM

## 2019-09-05 DIAGNOSIS — N184 Chronic kidney disease, stage 4 (severe): Secondary | ICD-10-CM

## 2019-09-05 HISTORY — DX: Atrioventricular block, complete: I44.2

## 2019-09-05 HISTORY — DX: Presence of cardiac pacemaker: Z95.0

## 2019-09-05 LAB — LIPID PANEL
Chol/HDL Ratio: 2.1 ratio (ref 0.0–4.4)
Cholesterol, Total: 125 mg/dL (ref 100–199)
HDL: 59 mg/dL (ref >39)
LDL Chol Calc (NIH): 39 mg/dL (ref 0–99)
Triglycerides: 162 mg/dL — ABNORMAL HIGH (ref 0–149)
VLDL Cholesterol Cal: 27 mg/dL (ref 5–40)

## 2019-09-05 NOTE — Progress Notes (Signed)
Cardiology Office Note:    Date:  09/05/2019   ID:  Amanda [name], DOB Jul 12, 1940, MRN [id number]  PCP:  Street, Amanda Mt, MD  Cardiologist:  Jenne Campus, MD    Referring MD: [address] Amanda [name], *   No chief complaint on file. I am doing very well  History of Present Illness:    Amanda [name] is a 79 y.o. female with past medical history significant for paroxysmal atrial fibrillation, history of CVA, chronic kidney failure, diabetes, hypertension, obstructive sleep apnea, she did have a watchman device implanted due to recurrent GI bleeding.  Also noted to have episode of syncope.  She did have implantable loop recorder inserted eventually she was find to have complete heart block and required permanent pacemaker which she received in March.  Since that time she is doing well.  She is not afraid to go anywhere now previously she was worried because she was worried she is going to pass out.  Denies having any chest pain tightness squeezing pressure burning chest.  She does have some exertional shortness of breath.  Past Medical History:  Diagnosis Date  . Acute ischemic stroke ([place])   . Acute metabolic encephalopathy   . Acute on chronic systolic (congestive) heart failure ([place])   . Atrial fibrillation ([place])   . Chronic anemia   . Chronic kidney disease (CKD), stage III (moderate)   . Diabetes mellitus without complication ([place])   . Diverticulosis   . Gastroesophageal reflux   . History of cardiac monitoring 02/23/2018   monitor inserted  . Hyperlipidemia   . Hypertensive heart disease with heart failure ([place])   . Myocardial infarction ([place])   . OSA on CPAP   . Renal failure, chronic, stage 3 (moderate)   . Systolic heart failure ([place])   . Thyroid disease     Past Surgical History:  Procedure Laterality Date  . CARDIAC CATHETERIZATION    . COLONOSCOPY  11/21/2014   Moderate predominantly sigmoid diverticulosis. Otherwise noraml collonscopy to TI.   Marland Kitchen  CORONARY ANGIOPLASTY WITH STENT PLACEMENT  08/2016  . EXCISIONAL HEMORRHOIDECTOMY    . LEFT ATRIAL APPENDAGE OCCLUSION  11/2016   in [place]  . LOOP RECORDER INSERTION N/A 02/23/2018   Procedure: LOOP RECORDER INSERTION;  Surgeon: Constance Haw, MD;  Location: [facility];  Service: Cardiovascular;  Laterality: N/A;  . LOOP RECORDER REMOVAL N/A 04/24/2019   Procedure: LOOP RECORDER REMOVAL;  Surgeon: Constance Haw, MD;  Location: [facility];  Service: Cardiovascular;  Laterality: N/A;  . NOSE SURGERY    . PACEMAKER IMPLANT N/A 04/24/2019   Procedure: PACEMAKER IMPLANT;  Surgeon: Constance Haw, MD;  Location: [facility];  Service: Cardiovascular;  Laterality: N/A;    Current Medications: Current Meds  Medication Sig  . acetaminophen (TYLENOL) 500 MG tablet Take 1,000 mg by mouth every 6 (six) hours as needed for moderate pain or headache.  Marland Kitchen BESIVANCE 0.6 % SUSP Place 1 drop into the right eye 3 (three) times daily.  . Biotin 5000 MCG CAPS Take 5,000 mcg by mouth daily at 12 noon.   . calcium carbonate (TUMS - DOSED IN MG ELEMENTAL CALCIUM) 500 MG chewable tablet Chew 1-2 tablets by mouth daily as needed for indigestion or heartburn.  . Carboxymethylcellul-Glycerin (REFRESH OPTIVE OP) Place 1 drop into both eyes 4 (four) times daily as needed (dry eyes).  . Cholecalciferol (VITAMIN D3) 50 MCG (2000 UT) TABS Take 2,000 Units by  mouth daily at 12 noon.  . clopidogrel (PLAVIX) 75 MG tablet Take 75 mg by mouth daily.  . Coenzyme Q10 100 MG TABS Take 100 mg by mouth daily at 12 noon.   . colchicine 0.6 MG tablet Take 0.6-1.2 mg by mouth See admin instructions. 1.2mg  at onset of gout attack, then 0.6mg  an hour later. Next day 0.6mg  twice a day, then 0.6mg  daily until gout pain subsides.   . DUREZOL 0.05 % EMUL Place 1 drop into the right eye See admin instructions. Instill 1 drop into the right eye twice daily for 7 days then 1 drop once daily for 7 days  then stop  . estradiol (ESTRACE) 0.1 MG/GM vaginal cream Place 1 Applicatorful vaginally every Monday, Wednesday, and Friday.   . Evolocumab (REPATHA SURECLICK) 096 MG/ML SOAJ Inject 140 mg into the skin every 14 (fourteen) days.  Marland Kitchen ezetimibe (ZETIA) 10 MG tablet TAKE 1 TABLET BY MOUTH EVERY EVENING.  . febuxostat (ULORIC) 40 MG tablet Take 40 mg by mouth every evening.   . ferrous sulfate 325 (65 FE) MG EC tablet Take 325 mg by mouth 2 (two) times daily.   . folic acid (FOLVITE) 283 MCG tablet Take 800 mcg by mouth daily at 12 noon.  . furosemide (LASIX) 20 MG tablet Take 20-40 mg by mouth See admin instructions. Take 2 tablets (40 mg) by mouth daily in the morning & 1 tablet (20 mg) by mouth at noon  . Glucosamine-Chondroitin (OSTEO BI-FLEX REGULAR STRENGTH PO) Take 1 tablet by mouth daily at 12 noon.  . isosorbide mononitrate (IMDUR) 30 MG 24 hr tablet TAKE 1 TABLET BY MOUTH DAILY. (Patient taking differently: Take 30 mg by mouth daily. )  . levothyroxine (SYNTHROID, LEVOTHROID) 50 MCG tablet Take 50 mcg by mouth daily before breakfast.  . Multiple Vitamins-Minerals (OCUVITE ADULT 50+ PO) Take 1 tablet by mouth daily at 12 noon.   . nitroGLYCERIN (NITROSTAT) 0.4 MG SL tablet Place 0.4 mg under the tongue every 5 (five) minutes as needed for chest pain.  Marland Kitchen omega-3 acid ethyl esters (LOVAZA) 1 g capsule Take 2 capsules (2 g total) by mouth daily. (Patient taking differently: Take 2 g by mouth daily at 12 noon. )  . pantoprazole (PROTONIX) 40 MG tablet TAKE 1 TABLET BY MOUTH DAILY. (Patient taking differently: Take 40 mg by mouth every evening. )  . polyethylene glycol powder (GLYCOLAX/MIRALAX) powder Take 17 g by mouth every other day. Alternating between miralax and benefiber  . pravastatin (PRAVACHOL) 40 MG tablet Take 40 mg by mouth every evening.   . Probiotic Product (PRO-BIOTIC BLEND PO) Take 1 capsule by mouth daily at 12 noon.   Marland Kitchen PROLENSA 0.07 % SOLN Place 1 drop into the right eye at  bedtime.  . ranolazine (RANEXA) 1000 MG SR tablet TAKE 1 TABLET BY MOUTH 2 TIMES DAILY.  . traMADol (ULTRAM) 50 MG tablet Take 50 mg by mouth every 12 (twelve) hours as needed for moderate pain.   . vitamin B-12 (CYANOCOBALAMIN) 1000 MCG tablet Take 1,000 mcg by mouth daily at 12 noon.  . Wheat Dextrin (BENEFIBER PO) Take 1 Dose by mouth every other day. Alternating between benefiber & miralax  . zolpidem (AMBIEN) 10 MG tablet Take 10 mg by mouth at bedtime as needed for sleep.   Current Facility-Administered Medications for the 09/05/19 encounter (Office Visit) with Park Liter, MD  Medication  . 0.9 %  sodium chloride infusion     Allergies:   Ciprofloxacin,  Contrast media [iodinated diagnostic agents], Nsaids, and Statins   Social History   Socioeconomic History  . Marital status: Married    Spouse name: Not on file  . Number of children: 2  . Years of education: Not on file  . Highest education level: Not on file  Occupational History  . Occupation: Retired   Tobacco Use  . Smoking status: Former Research scientist (life sciences)  . Smokeless tobacco: Never Used  Vaping Use  . Vaping Use: Never used  Substance and Sexual Activity  . Alcohol use: No  . Drug use: No  . Sexual activity: Not on file  Other Topics Concern  . Not on file  Social History Narrative  . Not on file   Social Determinants of Health   Financial Resource Strain:   . Difficulty of Paying Living Expenses:   Food Insecurity:   . Worried About Charity fundraiser in the Last Year:   . Arboriculturist in the Last Year:   Transportation Needs:   . Film/video editor (Medical):   Marland Kitchen Lack of Transportation (Non-Medical):   Physical Activity:   . Days of Exercise per Week:   . Minutes of Exercise per Session:   Stress:   . Feeling of Stress :   Social Connections:   . Frequency of Communication with Friends and Family:   . Frequency of Social Gatherings with Friends and Family:   . Attends Religious Services:   .  Active Member of Clubs or Organizations:   . Attends Archivist Meetings:   Marland Kitchen Marital Status:      Family History: The patient's family history includes Breast cancer in her mother; Cancer in her maternal grandfather; Diabetes in her sister; Heart attack in her brother, maternal uncle, and sister; Hypertension in her father; Prostate cancer in her father; Stroke in her father. There is no history of Esophageal cancer. ROS:   Please see the history of present illness.    All 14 point review of systems negative except as described per history of present illness  EKGs/Labs/Other Studies Reviewed:      Recent Labs: 04/24/2019: BUN 48; Creatinine, Ser 2.03; Hemoglobin 12.5; Platelets 217; Potassium 4.3; Sodium 141  Recent Lipid Panel    Component Value Date/Time   CHOL 196 02/13/2019 1557   TRIG 188 (H) 02/13/2019 1557   HDL 58 02/13/2019 1557   CHOLHDL 3.4 02/13/2019 1557   LDLCALC 106 (H) 02/13/2019 1557    Physical Exam:    VS:  BP 118/70 (BP Location: Left Arm, Patient Position: Sitting, Cuff Size: Normal)   Pulse 69   Ht 5\' 3"  (1.6 m)   Wt 178 lb 12.8 oz (81.1 kg)   SpO2 98%   BMI 31.67 kg/m     Wt Readings from Last 3 Encounters:  09/05/19 178 lb 12.8 oz (81.1 kg)  07/31/19 178 lb 6.4 oz (80.9 kg)  04/24/19 171 lb 3.2 oz (77.7 kg)     GEN:  Well nourished, well developed in no acute distress HEENT: Normal NECK: No JVD; No carotid bruits LYMPHATICS: No lymphadenopathy CARDIAC: RRR, no murmurs, no rubs, no gallops RESPIRATORY:  Clear to auscultation without rales, wheezing or rhonchi  ABDOMEN: Soft, non-tender, non-distended MUSCULOSKELETAL:  No edema; No deformity  SKIN: Warm and dry LOWER EXTREMITIES: no swelling NEUROLOGIC:  Alert and oriented x 3 PSYCHIATRIC:  Normal affect   ASSESSMENT:    1. Paroxysmal atrial fibrillation (HCC)   2. Presence of Watchman left atrial  appendage closure device   3. Pacemaker Medtronic device   4. Dyslipidemia     5. LV dysfunction   6. Benign hypertension with CKD (chronic kidney disease) stage IV ([place])   7. Heart block AV complete (HCC)    PLAN:    In order of problems listed above:  1. Paroxysmal atrial fibrillation.  She is not anticoagulated because of frequent GI bleeding.  She does have watchman device.  She had 2 episodes within last 6 months short lasting and does not bother her much.  Overall seems to be doing well from that point review. 2. Presence of watchman device.  Noted. 3. Medtronic pacemaker.  More than 30 years left in the device.  Normal function followed by our EP clinic.  I did review interrogation for this visit. 4. Dyslipidemia is done to recheck her fasting lipid profile which I will do. 5. History of LV dysfunction I will recheck her ejection fraction.  Echocardiogram will be done.  She complained of having some exertional shortness of breath. 6. Benign essential hypertension.  Blood pressure well controlled today 118/70 we will continue present management. 7. History of heart block that being addressed with a pacemaker. 8. She also talk about potentially having hip replacement surgery.  I will schedule her to have echocardiogram to assess left ventricle ejection fraction and if she decide to proceed with hip replacement stress test need to be done.   Medication Adjustments/Labs and Tests Ordered: Current medicines are reviewed at length with the patient today.  Concerns regarding medicines are outlined above.  Orders Placed This Encounter  Procedures  . ECHOCARDIOGRAM COMPLETE   Medication changes: No orders of the defined types were placed in this encounter.   Signed, Park Liter, MD, [facility] 09/05/2019 8:27 AM    [place]

## 2019-09-05 NOTE — Patient Instructions (Signed)
Medication Instructions:  °Your physician recommends that you continue on your current medications as directed. Please refer to the Current Medication list given to you today. ° °*If you need a refill on your cardiac medications before your next appointment, please call your pharmacy* ° ° °Lab Work: °None. ° °If you have labs (blood work) drawn today and your tests are completely normal, you will receive your results only by: °• MyChart Message (if you have MyChart) OR °• A paper copy in the mail °If you have any lab test that is abnormal or we need to change your treatment, we will call you to review the results. ° ° °Testing/Procedures: °Your physician has requested that you have an echocardiogram. Echocardiography is a painless test that uses sound waves to create images of your heart. It provides your doctor with information about the size and shape of your heart and how well your heart’s chambers and valves are working. This procedure takes approximately one hour. There are no restrictions for this procedure. ° ° ° ° °Follow-Up: °At [facility], you and your health needs are our priority.  As part of our continuing mission to provide you with exceptional heart care, we have created designated Provider Care Teams.  These Care Teams include your primary Cardiologist (physician) and Advanced Practice Providers (APPs -  Physician Assistants and Nurse Practitioners) who all work together to provide you with the care you need, when you need it. ° °We recommend signing up for the patient portal called "MyChart".  Sign up information is provided on this After Visit Summary.  MyChart is used to connect with patients for Virtual Visits (Telemedicine).  Patients are able to view lab/test results, encounter notes, upcoming appointments, etc.  Non-urgent messages can be sent to your provider as well.   °To learn more about what you can do with MyChart, go to [email or web address].   ° °Your next appointment:   °6  month(s) ° °The format for your next appointment:   °In Person ° °Provider:   °Robert Krasowski, MD ° ° °Other Instructions ° ° °Echocardiogram °An echocardiogram is a procedure that uses painless sound waves (ultrasound) to produce an image of the heart. Images from an echocardiogram can provide important information about: °· Signs of coronary artery disease (CAD). °· Aneurysm detection. An aneurysm is a weak or damaged part of an artery wall that bulges out from the normal force of blood pumping through the body. °· Heart size and shape. Changes in the size or shape of the heart can be associated with certain conditions, including heart failure, aneurysm, and CAD. °· Heart muscle function. °· Heart valve function. °· Signs of a past heart attack. °· Fluid buildup around the heart. °· Thickening of the heart muscle. °· A tumor or infectious growth around the heart valves. °Tell a health care provider about: °· Any allergies you have. °· All medicines you are taking, including vitamins, herbs, eye drops, creams, and over-the-counter medicines. °· Any blood disorders you have. °· Any surgeries you have had. °· Any medical conditions you have. °· Whether you are pregnant or may be pregnant. °What are the risks? °Generally, this is a safe procedure. However, problems may occur, including: °· Allergic reaction to dye (contrast) that may be used during the procedure. °What happens before the procedure? °No specific preparation is needed. You may eat and drink normally. °What happens during the procedure? ° °· An IV tube may be inserted into one of your veins. °· You may   receive contrast through this tube. A contrast is an injection that improves the quality of the pictures from your heart. °· A gel will be applied to your chest. °· A wand-like tool (transducer) will be moved over your chest. The gel will help to transmit the sound waves from the transducer. °· The sound waves will harmlessly bounce off of your heart to  allow the heart images to be captured in real-time motion. The images will be recorded on a computer. °The procedure may vary among health care providers and hospitals. °What happens after the procedure? °· You may return to your normal, everyday life, including diet, activities, and medicines, unless your health care provider tells you not to do that. °Summary °· An echocardiogram is a procedure that uses painless sound waves (ultrasound) to produce an image of the heart. °· Images from an echocardiogram can provide important information about the size and shape of your heart, heart muscle function, heart valve function, and fluid buildup around your heart. °· You do not need to do anything to prepare before this procedure. You may eat and drink normally. °· After the echocardiogram is completed, you may return to your normal, everyday life, unless your health care provider tells you not to do that. °This information is not intended to replace advice given to you by your health care provider. Make sure you discuss any questions you have with your health care provider. °Document Revised: 05/12/2018 Document Reviewed: 02/22/2016 °Elsevier Patient Education © 2020 [employer] ° ° °

## 2019-09-05 NOTE — Addendum Note (Signed)
Addended by: Ashok Norris on: 09/05/2019 08:37 AM   Modules accepted: Orders

## 2019-09-08 ENCOUNTER — Other Ambulatory Visit: Payer: Self-pay | Admitting: Cardiology

## 2019-09-22 ENCOUNTER — Ambulatory Visit (INDEPENDENT_AMBULATORY_CARE_PROVIDER_SITE_OTHER): Payer: Medicare Other

## 2019-09-22 ENCOUNTER — Other Ambulatory Visit: Payer: Self-pay

## 2019-09-22 DIAGNOSIS — I480 Paroxysmal atrial fibrillation: Secondary | ICD-10-CM

## 2019-09-22 LAB — ECHOCARDIOGRAM COMPLETE
AR max vel: 1.32 cm2
AV Area VTI: 1.24 cm2
AV Area mean vel: 1.29 cm2
AV Mean grad: 8 mmHg
AV Peak grad: 14.1 mmHg
Ao pk vel: 1.88 m/s
Area-P 1/2: 2.34 cm2
S' Lateral: 2.15 cm

## 2019-09-22 NOTE — Progress Notes (Signed)
Complete echocardiogram performed.  Jimmy Gazelle Towe RDCS, RVT  

## 2019-09-27 ENCOUNTER — Telehealth: Payer: Self-pay | Admitting: Cardiology

## 2019-09-27 NOTE — Telephone Encounter (Signed)
Called patient informed her of results.  

## 2019-09-27 NOTE — Telephone Encounter (Signed)
Follow Up:      Returning your call from today. 

## 2019-10-02 ENCOUNTER — Other Ambulatory Visit: Payer: Self-pay | Admitting: Cardiology

## 2019-10-08 IMAGING — CT CT ABD-PELV W/O CM
2 of 4 series · 16 of 46 positions shown, 18 images · non-contrast
Comparison: None.

CLINICAL DATA: Blood in stool. Anemia. Personal history of ischemic
colitis.

EXAM:
CT ABDOMEN AND PELVIS WITHOUT CONTRAST
TECHNIQUE: Multidetector CT imaging of the abdomen and pelvis was performed
following the standard protocol without IV contrast.

[Series 2: axial st · axial · 0.86mm/px · z∈[-462,-48]mm · 13 of 91 slices shown, 15 images]
[im 4/91  soft-tissue]
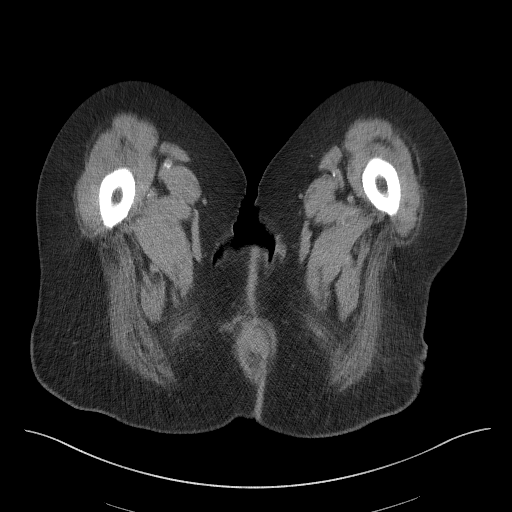
[im 4/91  bone]
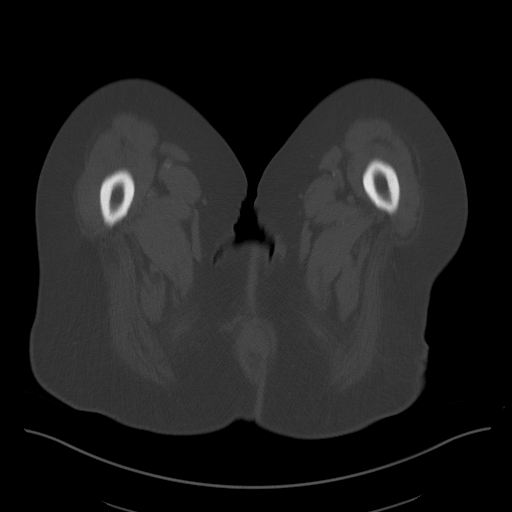
[im 11/91  soft-tissue]
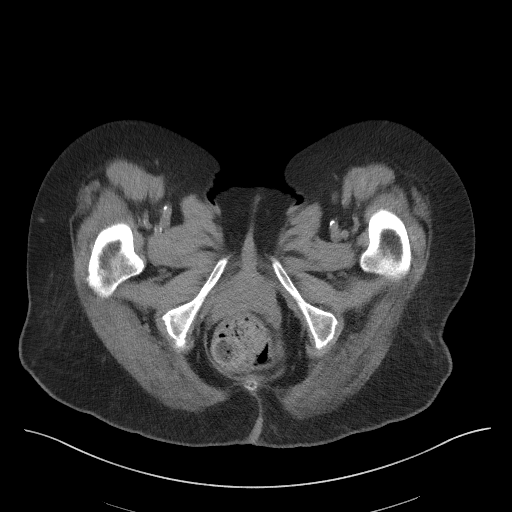
[im 19/91  soft-tissue]
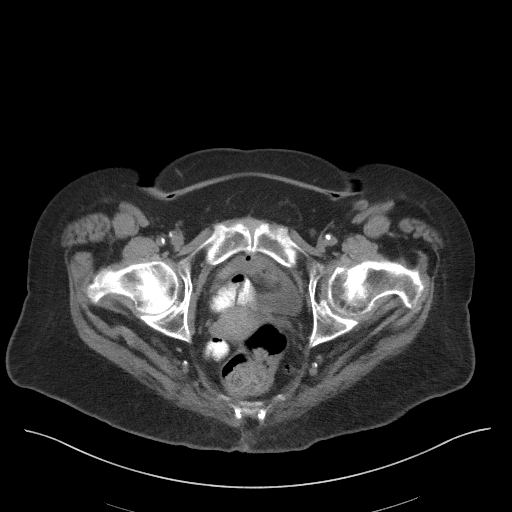
[im 26/91  soft-tissue]
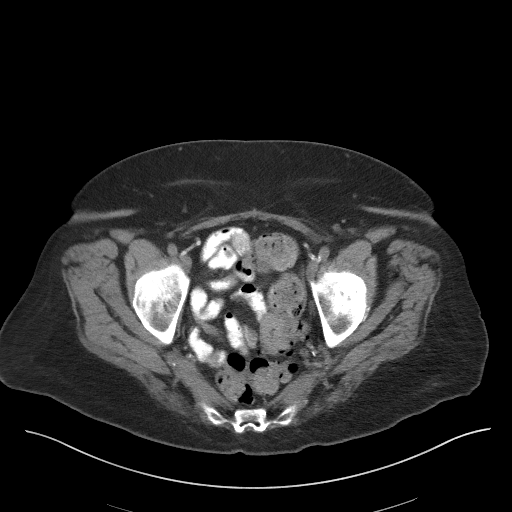
[im 33/91  soft-tissue]
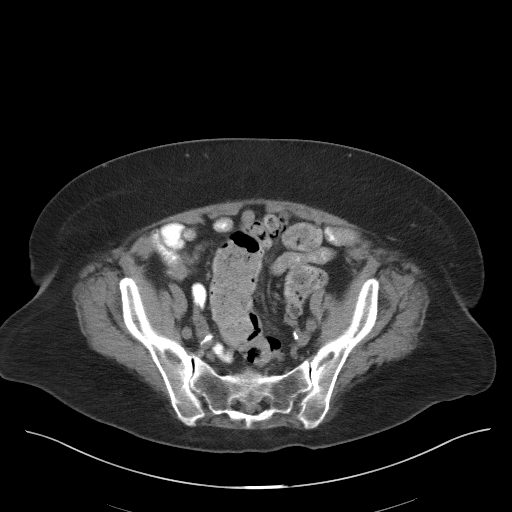
[im 40/91  soft-tissue]
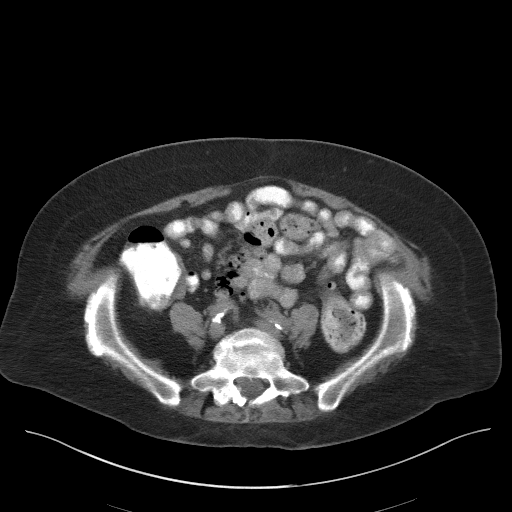
[im 47/91  soft-tissue]
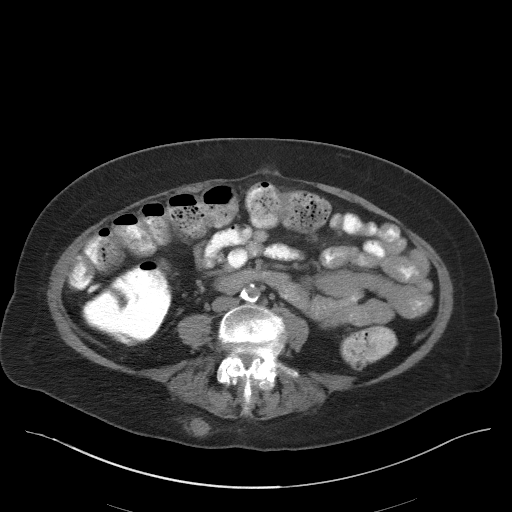
[im 51/91  soft-tissue]
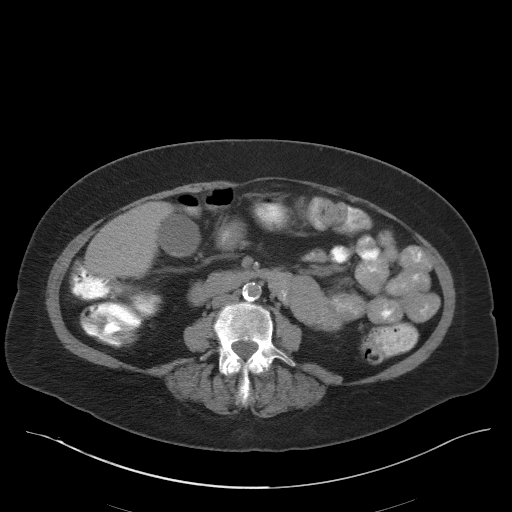
[im 58/91  soft-tissue]
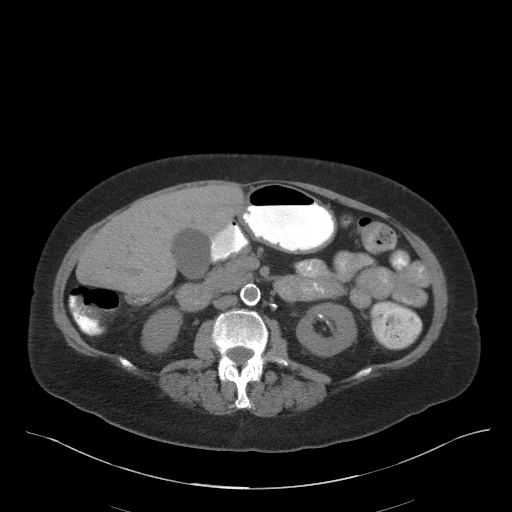
[im 58/91  bone]
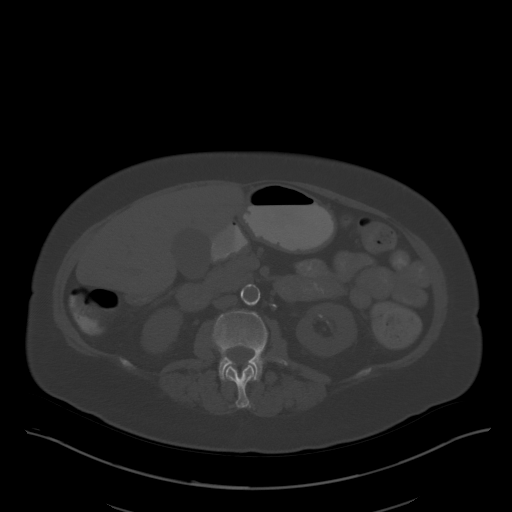
[im 65/91  soft-tissue]
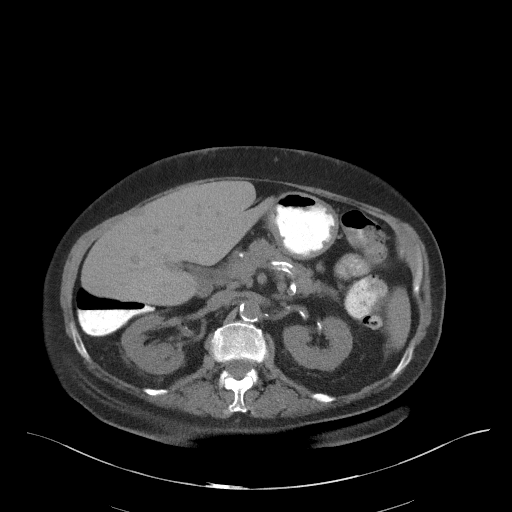
[im 73/91  soft-tissue]
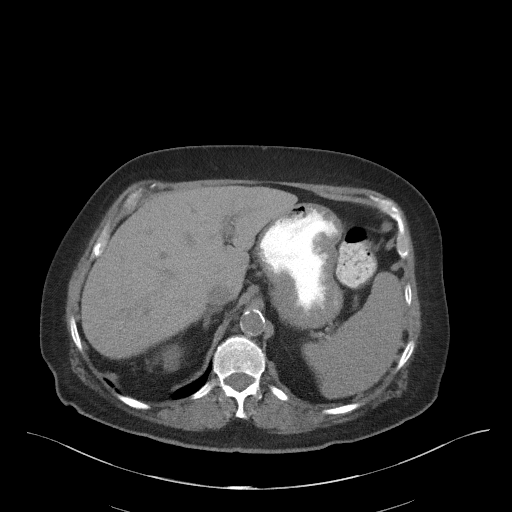
[im 80/91  soft-tissue]
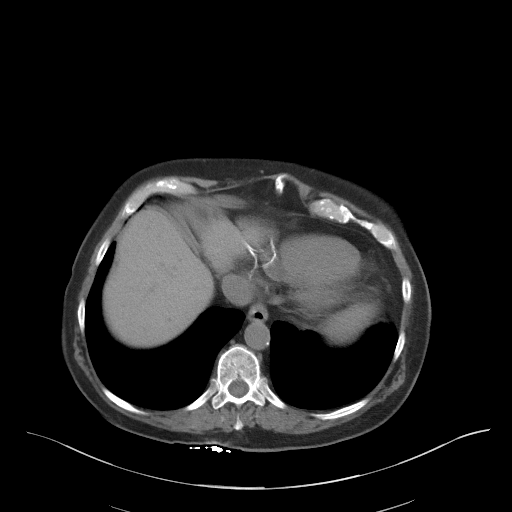
[im 87/91  soft-tissue]
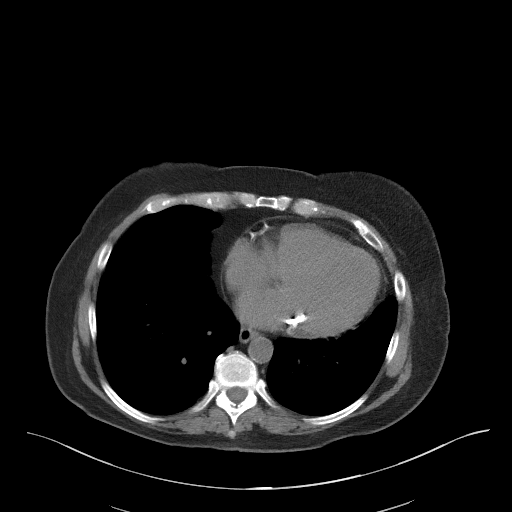

[Series 5: coronal st · coronal · 0.80mm/px · 3 of 101 slices shown]
[im 34/101  soft-tissue]
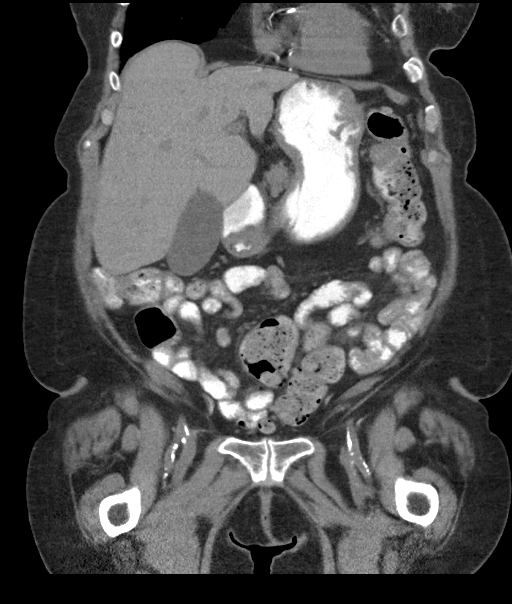
[im 45/101  soft-tissue]
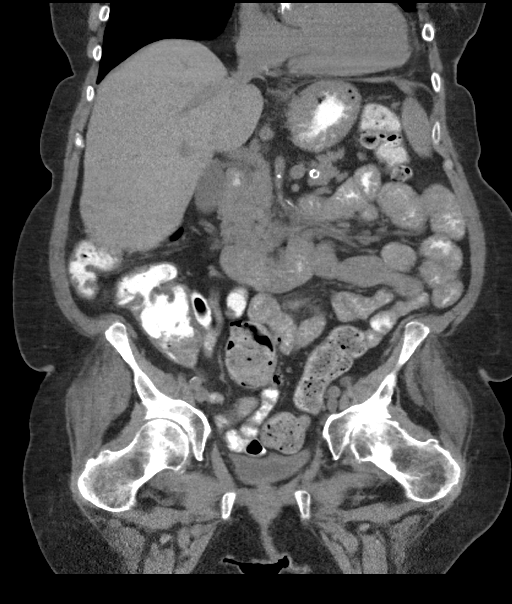
[im 56/101  soft-tissue]
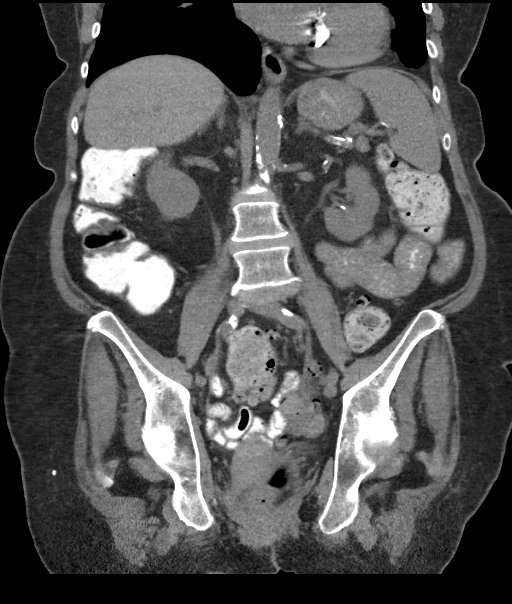

[16 of 46 positions shown; findings below may reference images not displayed]

FINDINGS: Lower chest: No acute findings.

Hepatobiliary: No mass visualized on this unenhanced exam. Tiny
gallstones or layering sludge noted. No evidence of cholecystitis or
biliary ductal dilatation.

Pancreas: No mass or inflammatory process visualized on this
unenhanced exam.

Spleen:  Within normal limits in size.

Adrenals/Urinary tract: No evidence of urolithiasis or
hydronephrosis. Unremarkable unopacified urinary bladder.

Stomach/Bowel: No evidence of obstruction, inflammatory process, or
abnormal fluid collections. Diverticulosis is seen mainly involving
the descending and sigmoid colon, however there is no evidence of
diverticulitis.

Vascular/Lymphatic: No pathologically enlarged lymph nodes
identified. No evidence of abdominal aortic aneurysm. Aortic
atherosclerosis.

Reproductive:  No mass or other significant abnormality.

Other:  None.

Musculoskeletal:  No suspicious bone lesions identified.
IMPRESSION: No acute findings.

Colonic diverticulosis, without radiographic evidence of
diverticulitis.

Tiny gallstones versus gallbladder sludge. No radiographic evidence
of cholecystitis or biliary dilatation.

## 2019-10-19 DIAGNOSIS — E785 Hyperlipidemia, unspecified: Secondary | ICD-10-CM | POA: Diagnosis not present

## 2019-10-19 DIAGNOSIS — E1122 Type 2 diabetes mellitus with diabetic chronic kidney disease: Secondary | ICD-10-CM | POA: Diagnosis not present

## 2019-10-19 DIAGNOSIS — E039 Hypothyroidism, unspecified: Secondary | ICD-10-CM | POA: Diagnosis not present

## 2019-10-19 DIAGNOSIS — N184 Chronic kidney disease, stage 4 (severe): Secondary | ICD-10-CM | POA: Diagnosis not present

## 2019-10-23 ENCOUNTER — Ambulatory Visit (INDEPENDENT_AMBULATORY_CARE_PROVIDER_SITE_OTHER): Payer: Medicare Other | Admitting: *Deleted

## 2019-10-23 DIAGNOSIS — I442 Atrioventricular block, complete: Secondary | ICD-10-CM | POA: Diagnosis not present

## 2019-10-23 LAB — CUP PACEART REMOTE DEVICE CHECK
Battery Remaining Longevity: 177 mo
Battery Voltage: 3.2 V
Brady Statistic AP VP Percent: 0.01 %
Brady Statistic AP VS Percent: 16.52 %
Brady Statistic AS VP Percent: 0.04 %
Brady Statistic AS VS Percent: 83.44 %
Brady Statistic RA Percent Paced: 15.96 %
Brady Statistic RV Percent Paced: 0.1 %
Date Time Interrogation Session: 20210919235957
Implantable Lead Implant Date: 20210322
Implantable Lead Implant Date: 20210322
Implantable Lead Location: 753859
Implantable Lead Location: 753860
Implantable Lead Model: 5076
Implantable Lead Model: 5076
Implantable Pulse Generator Implant Date: 20210322
Lead Channel Impedance Value: 342 Ohm
Lead Channel Impedance Value: 456 Ohm
Lead Channel Impedance Value: 475 Ohm
Lead Channel Impedance Value: 513 Ohm
Lead Channel Pacing Threshold Amplitude: 0.5 V
Lead Channel Pacing Threshold Amplitude: 0.625 V
Lead Channel Pacing Threshold Pulse Width: 0.4 ms
Lead Channel Pacing Threshold Pulse Width: 0.4 ms
Lead Channel Sensing Intrinsic Amplitude: 11 mV
Lead Channel Sensing Intrinsic Amplitude: 11 mV
Lead Channel Sensing Intrinsic Amplitude: 3.875 mV
Lead Channel Sensing Intrinsic Amplitude: 3.875 mV
Lead Channel Setting Pacing Amplitude: 1.5 V
Lead Channel Setting Pacing Amplitude: 2.5 V
Lead Channel Setting Pacing Pulse Width: 0.4 ms
Lead Channel Setting Sensing Sensitivity: 2 mV

## 2019-10-24 NOTE — Progress Notes (Signed)
Remote pacemaker transmission.   

## 2019-10-26 DIAGNOSIS — N184 Chronic kidney disease, stage 4 (severe): Secondary | ICD-10-CM | POA: Diagnosis not present

## 2019-10-26 DIAGNOSIS — I129 Hypertensive chronic kidney disease with stage 1 through stage 4 chronic kidney disease, or unspecified chronic kidney disease: Secondary | ICD-10-CM | POA: Diagnosis not present

## 2019-10-26 DIAGNOSIS — E1122 Type 2 diabetes mellitus with diabetic chronic kidney disease: Secondary | ICD-10-CM | POA: Diagnosis not present

## 2019-10-26 DIAGNOSIS — E889 Metabolic disorder, unspecified: Secondary | ICD-10-CM | POA: Diagnosis not present

## 2019-10-30 ENCOUNTER — Other Ambulatory Visit: Payer: Self-pay | Admitting: Cardiology

## 2019-11-02 ENCOUNTER — Telehealth: Payer: Self-pay | Admitting: Cardiology

## 2019-11-02 NOTE — Telephone Encounter (Signed)
Patient c/o Palpitations:  High priority if patient c/o lightheadedness, shortness of breath, or chest pain  1) How long have you had palpitations/irregular HR/ Afib? Are you having the symptoms now?       Pt has been in Afib since Tuesday night at 8pm 9/28 2) Are you currently experiencing lightheadedness, SOB or CP? No   3) Do you have a history of afib (atrial fibrillation) or irregular heart rhythm? Yes   4) Have you checked your BP or HR? (document readings if available):   HR has been ranging from 45 to 140 since Tuesday   5) Are you experiencing any other symptoms? Pt stated she is not experiencing any other symptoms and she stated her oxygen level are good .    Best number [phone]

## 2019-11-02 NOTE — Telephone Encounter (Signed)
Called patient. She reports she has been in atrial fibrillation since Tuesday night at 8 pm, the sensation has been constant since then. She is having palpitations and heart rates that fluctuate from 45-140. No shortness of breath and no chest pain. Will consult with Dr. Agustin Cree.

## 2019-11-03 NOTE — Telephone Encounter (Signed)
If discontinuing it bothers her now she probably to go to the emergency room

## 2019-11-03 NOTE — Telephone Encounter (Signed)
Called patient back informed her to go to emergency room if symptoms persist. She verbally understood. No further questions.

## 2019-11-16 DIAGNOSIS — H353131 Nonexudative age-related macular degeneration, bilateral, early dry stage: Secondary | ICD-10-CM | POA: Diagnosis not present

## 2019-11-16 DIAGNOSIS — H18593 Other hereditary corneal dystrophies, bilateral: Secondary | ICD-10-CM | POA: Diagnosis not present

## 2019-11-16 DIAGNOSIS — Z961 Presence of intraocular lens: Secondary | ICD-10-CM | POA: Diagnosis not present

## 2019-11-16 DIAGNOSIS — E119 Type 2 diabetes mellitus without complications: Secondary | ICD-10-CM | POA: Diagnosis not present

## 2019-11-28 DIAGNOSIS — Z23 Encounter for immunization: Secondary | ICD-10-CM | POA: Diagnosis not present

## 2019-12-06 ENCOUNTER — Encounter: Payer: Self-pay | Admitting: Sports Medicine

## 2019-12-06 ENCOUNTER — Other Ambulatory Visit: Payer: Self-pay

## 2019-12-06 ENCOUNTER — Ambulatory Visit: Payer: Medicare Other | Admitting: Sports Medicine

## 2019-12-06 DIAGNOSIS — L603 Nail dystrophy: Secondary | ICD-10-CM | POA: Diagnosis not present

## 2019-12-06 NOTE — Progress Notes (Signed)
Subjective: Amanda [name] is a 79 y.o. female patient seen today in office with complaint of discolored right great toenail.  Patient denies any pain to this toenail but did have a history of dropping a CD on the toe about a year ago reports that she had some dry blood that slowly grew out and then about 2 months ago noticed at the base of her nail some discoloration.  Patient states that it does not hurt or bother her but wanted to have it checked.  No other pedal complaints noted plaints at this time.   Patient Active Problem List   Diagnosis Date Noted  . 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 Iron deficiency anemia 12/28/2017  . Orthostatic hypotension 12/28/2017  . Syncope 11/04/2017  . Postural dizziness with presyncope 09/23/2017  . 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 Coronary artery disease 10/12/2016  . Acute renal insufficiency 09/02/2016  . LV dysfunction 09/02/2016  . GI bleed 08/06/2016  . NSTEMI (non-ST elevated myocardial infarction) ([place]) 08/06/2016  . Abnormal nuclear stress test 06/23/2016  . Chest pain 06/23/2016  . 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 Benign hypertension with CKD (chronic kidney disease) stage IV ([place]) 04/04/2015  . 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 Vitamin D deficiency 04/04/2015  . 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 Atrial fibrillation ([place]) 09/17/2014  . Bradycardia 09/17/2014  . Dyslipidemia 09/17/2014  . Obstructive sleep apnea 09/17/2014    Current Outpatient Medications on File Prior to Visit  Medication Sig Dispense Refill  . acetaminophen (TYLENOL) 500 MG tablet Take 1,000 mg by mouth every 6 (six) hours as needed for moderate pain or headache.    Marland Kitchen BESIVANCE 0.6 % SUSP Place 1 drop into the right eye 3 (three) times daily.    . Biotin 5000 MCG CAPS Take 5,000 mcg by  mouth daily at 12 noon.     . calcium carbonate (TUMS - DOSED IN MG ELEMENTAL CALCIUM) 500 MG chewable tablet Chew 1-2 tablets by mouth daily as needed for indigestion or heartburn.    . Carboxymethylcellul-Glycerin (REFRESH OPTIVE OP) Place 1 drop into both eyes 4 (four) times daily as needed (dry eyes).    . Cholecalciferol (VITAMIN D3) 50 MCG (2000 UT) TABS Take 2,000 Units by mouth daily at 12 noon.    . clopidogrel (PLAVIX) 75 MG tablet Take 75 mg by mouth daily.    . Coenzyme Q10 100 MG TABS Take 100 mg by mouth daily at 12 noon.     . colchicine 0.6 MG tablet Take 0.6-1.2 mg by mouth See admin instructions. 1.2mg  at onset of gout attack, then 0.6mg  an hour later. Next day 0.6mg  twice a day, then 0.6mg  daily until gout pain subsides.     . DUREZOL 0.05 % EMUL Place 1 drop into the right eye See admin instructions. Instill 1 drop into the right eye twice daily for 7 days then 1 drop once daily for 7 days then stop    . estradiol (ESTRACE) 0.1 MG/GM vaginal cream Place 1 Applicatorful vaginally every Monday, Wednesday, and Friday.     . Evolocumab (REPATHA SURECLICK) 427 MG/ML SOAJ Inject 140 mg into the skin every 14 (fourteen) days. 2 pen 11  . ezetimibe (ZETIA) 10 MG tablet TAKE 1 TABLET BY MOUTH EVERY EVENING. 90 tablet 3  . febuxostat (ULORIC) 40  MG tablet Take 40 mg by mouth every evening.     . ferrous sulfate 325 (65 FE) MG EC tablet Take 325 mg by mouth 2 (two) times daily.     . folic acid (FOLVITE) 710 MCG tablet Take 800 mcg by mouth daily at 12 noon.    . furosemide (LASIX) 20 MG tablet Take 20-40 mg by mouth See admin instructions. Take 2 tablets (40 mg) by mouth daily in the morning & 1 tablet (20 mg) by mouth at noon    . Glucosamine-Chondroitin (OSTEO BI-FLEX REGULAR STRENGTH PO) Take 1 tablet by mouth daily at 12 noon.    . isosorbide mononitrate (IMDUR) 30 MG 24 hr tablet TAKE 1 TABLET BY MOUTH DAILY. 90 tablet 3  . levothyroxine (SYNTHROID, LEVOTHROID) 50 MCG tablet Take 50  mcg by mouth daily before breakfast.    . Multiple Vitamins-Minerals (OCUVITE ADULT 50+ PO) Take 1 tablet by mouth daily at 12 noon.     . nitroGLYCERIN (NITROSTAT) 0.4 MG SL tablet Place 0.4 mg under the tongue every 5 (five) minutes as needed for chest pain.    Marland Kitchen omega-3 acid ethyl esters (LOVAZA) 1 g capsule TAKE 2 CAPSULES BY MOUTH DAILY 60 capsule 4  . pantoprazole (PROTONIX) 40 MG tablet TAKE 1 TABLET BY MOUTH DAILY 90 tablet 1  . polyethylene glycol powder (GLYCOLAX/MIRALAX) powder Take 17 g by mouth every other day. Alternating between miralax and benefiber    . pravastatin (PRAVACHOL) 40 MG tablet Take 40 mg by mouth every evening.     . Probiotic Product (PRO-BIOTIC BLEND PO) Take 1 capsule by mouth daily at 12 noon.     Marland Kitchen PROLENSA 0.07 % SOLN Place 1 drop into the right eye at bedtime.    . ranolazine (RANEXA) 1000 MG SR tablet TAKE 1 TABLET BY MOUTH 2 TIMES DAILY. 180 tablet 1  . traMADol (ULTRAM) 50 MG tablet Take 50 mg by mouth every 12 (twelve) hours as needed for moderate pain.     . vitamin B-12 (CYANOCOBALAMIN) 1000 MCG tablet Take 1,000 mcg by mouth daily at 12 noon.    . Wheat Dextrin (BENEFIBER PO) Take 1 Dose by mouth every other day. Alternating between benefiber & miralax    . zolpidem (AMBIEN) 10 MG tablet Take 10 mg by mouth at bedtime as needed for sleep.     Current Facility-Administered Medications on File Prior to Visit  Medication Dose Route Frequency Provider Last Rate Last Admin  . 0.9 %  sodium chloride infusion  500 mL Intravenous Once Jackquline Denmark, MD        Allergies  Allergen Reactions  . Ciprofloxacin Other (See Comments)    Achilles tendon pain  . Contrast Media [Iodinated Diagnostic Agents]     Avoid due to stage 4 kidney disease   . Nsaids     Avoid due to stage 4 kidney disease   . Statins Other (See Comments)    Joint pain, tolerates pravastatin     Objective: Physical Exam  General: Well developed, nourished, no acute distress, awake,  alert and oriented x 3  Vascular: Dorsalis pedis artery 1/4 bilateral, Posterior tibial artery 1/4 bilateral, skin temperature warm to warm proximal to distal bilateral lower extremities, mild varicosities with venous hyperpigmentation bilateral, no pedal hair present bilateral.  Neurological: Gross sensation present via light touch bilateral.   Dermatological: Skin is warm, dry, and supple bilateral, Nails 1-10 are well manicured however at the proximal nail fold there is discoloration  white in nature at the base of the right hallux nail likely resembling old trauma versus concern for nail fungus  Musculoskeletal: No symptomatic symptomatic boney deformities noted bilateral. Muscular strength within normal limits without painon range of motion. No pain with calf compression bilateral.  Assessment and Plan:  Problem List Items Addressed This Visit    None    Visit Diagnoses    Nail dystrophy    -  Primary      -Examined patient -Discussed treatment options for dystrophic nail -Advised patient since there is no pain at right 1st toenail we will monitor -May use tea tree oil PRN -Patient to return if fails to improve or sooner if symptoms worsen.  Landis Martins, DPM

## 2019-12-06 NOTE — Patient Instructions (Signed)
May try OTC tea tree oil to nail as needed

## 2019-12-14 DIAGNOSIS — E785 Hyperlipidemia, unspecified: Secondary | ICD-10-CM | POA: Diagnosis not present

## 2019-12-14 DIAGNOSIS — N184 Chronic kidney disease, stage 4 (severe): Secondary | ICD-10-CM | POA: Diagnosis not present

## 2019-12-14 DIAGNOSIS — E1122 Type 2 diabetes mellitus with diabetic chronic kidney disease: Secondary | ICD-10-CM | POA: Diagnosis not present

## 2019-12-14 DIAGNOSIS — E039 Hypothyroidism, unspecified: Secondary | ICD-10-CM | POA: Diagnosis not present

## 2019-12-19 NOTE — Telephone Encounter (Signed)
Error

## 2019-12-25 ENCOUNTER — Other Ambulatory Visit: Payer: Self-pay | Admitting: Cardiology

## 2019-12-25 NOTE — Telephone Encounter (Signed)
Rx refill sent to pharmacy. 

## 2020-01-01 ENCOUNTER — Telehealth: Payer: Self-pay | Admitting: Cardiology

## 2020-01-01 NOTE — Telephone Encounter (Signed)
Pharmacy called in to verify how pt should be taking the omega 3's?  1 time daily or 2 times daily?    Best number for [employer]  [phone]

## 2020-01-01 NOTE — Telephone Encounter (Signed)
Called pharmacy and spoke to [place]. Informed her that patient takes two capsules per day of lovaza. She verbally understood. No further questions.

## 2020-01-09 DIAGNOSIS — E669 Obesity, unspecified: Secondary | ICD-10-CM | POA: Diagnosis not present

## 2020-01-09 DIAGNOSIS — I10 Essential (primary) hypertension: Secondary | ICD-10-CM | POA: Diagnosis not present

## 2020-01-09 DIAGNOSIS — Z6831 Body mass index (BMI) 31.0-31.9, adult: Secondary | ICD-10-CM | POA: Diagnosis not present

## 2020-01-09 DIAGNOSIS — Z8639 Personal history of other endocrine, nutritional and metabolic disease: Secondary | ICD-10-CM | POA: Diagnosis not present

## 2020-01-22 ENCOUNTER — Ambulatory Visit (INDEPENDENT_AMBULATORY_CARE_PROVIDER_SITE_OTHER): Payer: Medicare Other

## 2020-01-22 DIAGNOSIS — I442 Atrioventricular block, complete: Secondary | ICD-10-CM | POA: Diagnosis not present

## 2020-01-22 LAB — CUP PACEART REMOTE DEVICE CHECK
Battery Remaining Longevity: 173 mo
Battery Voltage: 3.16 V
Brady Statistic AP VP Percent: 0.02 %
Brady Statistic AP VS Percent: 21.64 %
Brady Statistic AS VP Percent: 0.03 %
Brady Statistic AS VS Percent: 78.48 %
Brady Statistic RA Percent Paced: 0.99 %
Brady Statistic RV Percent Paced: 13.5 %
Date Time Interrogation Session: 20211220014400
Implantable Lead Implant Date: 20210322
Implantable Lead Implant Date: 20210322
Implantable Lead Location: 753859
Implantable Lead Location: 753860
Implantable Lead Model: 5076
Implantable Lead Model: 5076
Implantable Pulse Generator Implant Date: 20210322
Lead Channel Impedance Value: 342 Ohm
Lead Channel Impedance Value: 418 Ohm
Lead Channel Impedance Value: 475 Ohm
Lead Channel Impedance Value: 532 Ohm
Lead Channel Pacing Threshold Amplitude: 0.625 V
Lead Channel Pacing Threshold Amplitude: 0.625 V
Lead Channel Pacing Threshold Pulse Width: 0.4 ms
Lead Channel Pacing Threshold Pulse Width: 0.4 ms
Lead Channel Sensing Intrinsic Amplitude: 4.625 mV
Lead Channel Sensing Intrinsic Amplitude: 4.625 mV
Lead Channel Sensing Intrinsic Amplitude: 9.875 mV
Lead Channel Sensing Intrinsic Amplitude: 9.875 mV
Lead Channel Setting Pacing Amplitude: 1.5 V
Lead Channel Setting Pacing Amplitude: 2.5 V
Lead Channel Setting Pacing Pulse Width: 0.4 ms
Lead Channel Setting Sensing Sensitivity: 2 mV

## 2020-02-05 NOTE — Progress Notes (Signed)
Remote pacemaker transmission.   

## 2020-02-19 ENCOUNTER — Encounter: Payer: Medicare Other | Admitting: Cardiology

## 2020-03-04 ENCOUNTER — Other Ambulatory Visit: Payer: Self-pay | Admitting: Cardiology

## 2020-03-04 NOTE — Telephone Encounter (Signed)
Rx refill sent to pharmacy. 

## 2020-03-05 DIAGNOSIS — I110 Hypertensive heart disease with heart failure: Secondary | ICD-10-CM | POA: Insufficient documentation

## 2020-03-05 DIAGNOSIS — I219 Acute myocardial infarction, unspecified: Secondary | ICD-10-CM | POA: Insufficient documentation

## 2020-03-05 DIAGNOSIS — G9341 Metabolic encephalopathy: Secondary | ICD-10-CM | POA: Insufficient documentation

## 2020-03-05 DIAGNOSIS — Z9989 Dependence on other enabling machines and devices: Secondary | ICD-10-CM | POA: Insufficient documentation

## 2020-03-05 DIAGNOSIS — I639 Cerebral infarction, unspecified: Secondary | ICD-10-CM | POA: Insufficient documentation

## 2020-03-05 DIAGNOSIS — N183 Chronic kidney disease, stage 3 unspecified: Secondary | ICD-10-CM | POA: Insufficient documentation

## 2020-03-05 DIAGNOSIS — G4733 Obstructive sleep apnea (adult) (pediatric): Secondary | ICD-10-CM | POA: Insufficient documentation

## 2020-03-05 DIAGNOSIS — E079 Disorder of thyroid, unspecified: Secondary | ICD-10-CM | POA: Insufficient documentation

## 2020-03-05 DIAGNOSIS — E119 Type 2 diabetes mellitus without complications: Secondary | ICD-10-CM | POA: Insufficient documentation

## 2020-03-05 DIAGNOSIS — I5023 Acute on chronic systolic (congestive) heart failure: Secondary | ICD-10-CM | POA: Insufficient documentation

## 2020-03-05 DIAGNOSIS — E785 Hyperlipidemia, unspecified: Secondary | ICD-10-CM | POA: Insufficient documentation

## 2020-03-05 DIAGNOSIS — D649 Anemia, unspecified: Secondary | ICD-10-CM | POA: Insufficient documentation

## 2020-03-05 DIAGNOSIS — I5022 Chronic systolic (congestive) heart failure: Secondary | ICD-10-CM | POA: Insufficient documentation

## 2020-03-05 DIAGNOSIS — K5790 Diverticulosis of intestine, part unspecified, without perforation or abscess without bleeding: Secondary | ICD-10-CM | POA: Insufficient documentation

## 2020-03-05 DIAGNOSIS — I5020 Unspecified systolic (congestive) heart failure: Secondary | ICD-10-CM | POA: Insufficient documentation

## 2020-03-05 DIAGNOSIS — K219 Gastro-esophageal reflux disease without esophagitis: Secondary | ICD-10-CM | POA: Insufficient documentation

## 2020-03-07 ENCOUNTER — Encounter: Payer: Self-pay | Admitting: Cardiology

## 2020-03-07 ENCOUNTER — Other Ambulatory Visit: Payer: Self-pay

## 2020-03-07 ENCOUNTER — Ambulatory Visit: Payer: Medicare Other | Admitting: Cardiology

## 2020-03-07 VITALS — BP 110/68 | HR 72 | Ht 63.0 in | Wt 182.0 lb

## 2020-03-07 DIAGNOSIS — I442 Atrioventricular block, complete: Secondary | ICD-10-CM

## 2020-03-07 DIAGNOSIS — I480 Paroxysmal atrial fibrillation: Secondary | ICD-10-CM

## 2020-03-07 DIAGNOSIS — E782 Mixed hyperlipidemia: Secondary | ICD-10-CM

## 2020-03-07 DIAGNOSIS — Z950 Presence of cardiac pacemaker: Secondary | ICD-10-CM | POA: Diagnosis not present

## 2020-03-07 DIAGNOSIS — Z95818 Presence of other cardiac implants and grafts: Secondary | ICD-10-CM | POA: Diagnosis not present

## 2020-03-07 MED ORDER — METOPROLOL SUCCINATE ER 25 MG PO TB24: 25.0000 mg | ORAL_TABLET | Freq: Every day | ORAL | 1 refills | Status: DC

## 2020-03-07 NOTE — Progress Notes (Unsigned)
Cardiology Office Note:    Date:  03/07/2020   ID:  Amanda [name], DOB 11-26-1940, MRN [id number]  PCP:  Street, Sharon Mt, MD  Cardiologist:  Jenne Campus, MD    Referring MD: Street, Sharon Mt, *   Chief Complaint  Patient presents with  . Atrial Fibrillation       I have atrial fibrillation  History of Present Illness:    Amanda [name] is a 80 y.o. female with past medical history significant paroxysmal atrial fibrillation, history of CVA, chronic kidney failure, diabetes, hypertension, obstructive sleep apnea, watchman device present, not anticoagulated secondary to recurrent GI bleeding.  She comes today 2 months of follow-up.  Overall she is doing well but she tells me she knows that she is October she is in atrial fibrillation.  She feels irregularity of her heartbeats and sometimes when she lays down at night she will feel her heart beating forcefully.  She did not notice any decrease in her exercise ability but that is secondary to the fact that her exercise ability is limited because of chronic hip problem.  She is contemplating potentially having surgery but did not make that decision yet.  No dizziness no passing out.  Past Medical History:  Diagnosis Date  . Acute ischemic stroke ([place])   . Acute metabolic encephalopathy   . Acute on chronic systolic (congestive) heart failure ([place])   . Acute renal insufficiency 09/02/2016  . Anemia due to stage 4 chronic kidney disease ([place]) 04/04/2015  . Atrial fibrillation ([place])   . Benign hypertension with CKD (chronic kidney disease) stage IV ([place]) 04/04/2015  . Bradycardia 09/17/2014  . Chest pain 06/23/2016   Overview:  Added automatically from request for surgery [id number]  . Chronic anemia   . Chronic kidney disease (CKD), stage III (moderate) (HCC)   . Coronary artery disease 10/12/2016   Non drug-eluting stent implanted in June 2018 to mid RCA   08/08/2016 10:37  Angiographic Findings  Cardiac Arteries and Lesion  Findings LMCA: 0% and Normal. LAD: 0% and Normal. RCA: Lesion on Mid RCA: Mid subsection.95% stenosis reduced to 0%. Pre procedure TIMI II flow was noted. Post Procedure TIMI III flow was present. Poor run off was present. The lesion was diagnosed as High Risk (C).   . 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 Diabetes mellitus without complication ([place])   . Diverticulosis   . Dyslipidemia 09/17/2014  . Gastroesophageal reflux   . GI bleed 08/06/2016  . 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 History of cardiac monitoring 02/23/2018   monitor inserted  . Hyperlipidemia   . Hypertensive heart disease with heart failure ([place])   . Iron deficiency anemia 12/28/2017  . LV dysfunction 09/02/2016  . Myocardial infarction ([place])   . NSTEMI (non-ST elevated myocardial infarction) ([place]) 08/06/2016  . Obstructive sleep apnea 09/17/2014  . Orthostatic hypotension 12/28/2017  . OSA on CPAP   . 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 Postural dizziness with presyncope 09/23/2017  . 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 Renal failure, chronic, stage 3 (moderate) (HCC)   . Syncope 11/04/2017  . Systolic heart failure ([place])   . Thyroid disease   . Vitamin D deficiency 04/04/2015    Past Surgical History:  Procedure Laterality Date  . CARDIAC CATHETERIZATION    . COLONOSCOPY  11/21/2014   Moderate predominantly sigmoid diverticulosis. Otherwise noraml  collonscopy to TI.   Marland Kitchen CORONARY ANGIOPLASTY WITH STENT PLACEMENT  08/2016  . EXCISIONAL HEMORRHOIDECTOMY    . LEFT ATRIAL APPENDAGE OCCLUSION  11/2016   in [place]  . LOOP RECORDER INSERTION N/A 02/23/2018   Procedure: LOOP RECORDER INSERTION;  Surgeon: Constance Haw, MD;  Location: [facility];  Service: Cardiovascular;  Laterality: N/A;  . LOOP RECORDER REMOVAL N/A 04/24/2019   Procedure: LOOP RECORDER REMOVAL;   Surgeon: Constance Haw, MD;  Location: [facility];  Service: Cardiovascular;  Laterality: N/A;  . NOSE SURGERY    . PACEMAKER IMPLANT N/A 04/24/2019   Procedure: PACEMAKER IMPLANT;  Surgeon: Constance Haw, MD;  Location: [facility];  Service: Cardiovascular;  Laterality: N/A;    Current Medications: Current Meds  Medication Sig  . acetaminophen (TYLENOL) 500 MG tablet Take 1,000 mg by mouth every 6 (six) hours as needed for moderate pain or headache.  . Biotin 5000 MCG CAPS Take 5,000 mcg by mouth daily at 12 noon.   . calcium carbonate (TUMS - DOSED IN MG ELEMENTAL CALCIUM) 500 MG chewable tablet Chew 1-2 tablets by mouth daily as needed for indigestion or heartburn.  . Carboxymethylcellul-Glycerin (REFRESH OPTIVE OP) Place 1 drop into both eyes 4 (four) times daily as needed (dry eyes).  . Cholecalciferol (VITAMIN D3) 50 MCG (2000 UT) TABS Take 2,000 Units by mouth daily at 12 noon.  . clopidogrel (PLAVIX) 75 MG tablet Take 75 mg by mouth daily.  . Coenzyme Q10 100 MG TABS Take 100 mg by mouth daily at 12 noon.   . colchicine 0.6 MG tablet Take 0.6-1.2 mg by mouth See admin instructions. 1.'2mg'$  at onset of gout attack, then 0.'6mg'$  an hour later. Next day 0.'6mg'$  twice a day, then 0.'6mg'$  daily until gout pain subsides.  Marland Kitchen estradiol (ESTRACE) 0.1 MG/GM vaginal cream Place 1 Applicatorful vaginally every Monday, Wednesday, and Friday.   . ezetimibe (ZETIA) 10 MG tablet TAKE 1 TABLET BY MOUTH EVERY EVENING.  . febuxostat (ULORIC) 40 MG tablet Take 40 mg by mouth every evening.   . ferrous sulfate 325 (65 FE) MG EC tablet Take 325 mg by mouth 2 (two) times daily.   . folic acid (FOLVITE) [id number] MCG tablet Take 800 mcg by mouth daily at 12 noon.  . furosemide (LASIX) 20 MG tablet Take 20-40 mg by mouth See admin instructions. Take 2 tablets (40 mg) by mouth daily in the morning & 1 tablet (20 mg) by mouth at noon  . Glucosamine-Chondroitin (OSTEO BI-FLEX REGULAR STRENGTH PO)  Take 1 tablet by mouth daily at 12 noon.  . isosorbide mononitrate (IMDUR) 30 MG 24 hr tablet TAKE 1 TABLET BY MOUTH DAILY.  Marland Kitchen levothyroxine (SYNTHROID, LEVOTHROID) 50 MCG tablet Take 50 mcg by mouth daily before breakfast.  . Multiple Vitamins-Minerals (OCUVITE ADULT 50+ PO) Take 1 tablet by mouth daily at 12 noon.   . nitroGLYCERIN (NITROSTAT) 0.4 MG SL tablet Place 0.4 mg under the tongue every 5 (five) minutes as needed for chest pain.  Marland Kitchen omega-3 acid ethyl esters (LOVAZA) 1 g capsule TAKE 2 CAPSULES BY MOUTH DAILY  . pantoprazole (PROTONIX) 40 MG tablet TAKE 1 TABLET BY MOUTH DAILY  . polyethylene glycol powder (GLYCOLAX/MIRALAX) powder Take 17 g by mouth every other day. Alternating between miralax and benefiber  . pravastatin (PRAVACHOL) 40 MG tablet Take 40 mg by mouth every evening.   . Probiotic Product (PRO-BIOTIC BLEND PO) Take 1 capsule by mouth daily  at 12 noon.   Marland Kitchen PROLENSA 0.07 % SOLN Place 1 drop into the right eye at bedtime.  . ranolazine (RANEXA) 1000 MG SR tablet TAKE 1 TABLET BY MOUTH 2 TIMES DAILY.  Marland Kitchen REPATHA SURECLICK [id number] MG/ML SOAJ INJECT 140 MG INTO THE SKIN EVERY 14 DAYS.  Marland Kitchen traMADol (ULTRAM) 50 MG tablet Take 50 mg by mouth every 12 (twelve) hours as needed for moderate pain.   . vitamin B-12 (CYANOCOBALAMIN) 1000 MCG tablet Take 1,000 mcg by mouth daily at 12 noon.  . Wheat Dextrin (BENEFIBER PO) Take 1 Dose by mouth every other day. Alternating between benefiber & miralax  . zolpidem (AMBIEN) 10 MG tablet Take 10 mg by mouth at bedtime as needed for sleep.   Current Facility-Administered Medications for the 03/07/20 encounter (Office Visit) with Park Liter, MD  Medication  . 0.9 %  sodium chloride infusion     Allergies:   Ciprofloxacin, Contrast media [iodinated diagnostic agents], Nsaids, and Statins   Social History   Socioeconomic History  . Marital status: Married    Spouse name: Not on file  . Number of children: 2  . Years of education: Not  on file  . Highest education level: Not on file  Occupational History  . Occupation: Retired   Tobacco Use  . Smoking status: Former Research scientist (life sciences)  . Smokeless tobacco: Never Used  Vaping Use  . Vaping Use: Never used  Substance and Sexual Activity  . Alcohol use: No  . Drug use: No  . Sexual activity: Not on file  Other Topics Concern  . Not on file  Social History Narrative  . Not on file   Social Determinants of Health   Financial Resource Strain: Not on file  Food Insecurity: Not on file  Transportation Needs: Not on file  Physical Activity: Not on file  Stress: Not on file  Social Connections: Not on file     Family History: The patient's family history includes Breast cancer in her mother; Cancer in her maternal grandfather; Diabetes in her sister; Heart attack in her brother, maternal uncle, and sister; Hypertension in her father; Prostate cancer in her father; Stroke in her father. There is no history of Esophageal cancer. ROS:   Please see the history of present illness.    All 14 point review of systems negative except as described per history of present illness  EKGs/Labs/Other Studies Reviewed:      Recent Labs: 04/24/2019: BUN 48; Creatinine, Ser 2.03; Hemoglobin 12.5; Platelets 217; Potassium 4.3; Sodium 141  Recent Lipid Panel    Component Value Date/Time   CHOL 125 09/05/2019 0842   TRIG 162 (H) 09/05/2019 0842   HDL 59 09/05/2019 0842   CHOLHDL 2.1 09/05/2019 0842   LDLCALC 39 09/05/2019 0842    Physical Exam:    VS:  BP 110/68 (BP Location: Left Arm, Patient Position: Sitting)   Pulse 72   Ht '5\' 3"'$  (1.6 m)   Wt 182 lb (82.6 kg)   SpO2 98%   BMI 32.24 kg/m     Wt Readings from Last 3 Encounters:  03/07/20 182 lb (82.6 kg)  09/05/19 178 lb 12.8 oz (81.1 kg)  07/31/19 178 lb 6.4 oz (80.9 kg)     GEN:  Well nourished, well developed in no acute distress HEENT: Normal NECK: No JVD; No carotid bruits LYMPHATICS: No lymphadenopathy CARDIAC:  Irregular, no murmurs, no rubs, no gallops RESPIRATORY:  Clear to auscultation without rales, wheezing or rhonchi  ABDOMEN:  Soft, non-tender, non-distended MUSCULOSKELETAL:  No edema; No deformity  SKIN: Warm and dry LOWER EXTREMITIES: no swelling NEUROLOGIC:  Alert and oriented x 3 PSYCHIATRIC:  Normal affect   ASSESSMENT:    1. Paroxysmal atrial fibrillation (HCC)   2. Presence of Watchman left atrial appendage closure device   3. Pacemaker Medtronic device   4. Mixed hyperlipidemia   5. Heart block AV complete (HCC)    PLAN:    In order of problems listed above:  1. Paroxysmal atrial fibrillation interrogation of the device reviewed.  She is in atrial fibrillation since October with slightly accelerated heart rate sometimes.  I will give her beta-blocker to see if I can calm her heart rate down.  She is scheduled to see our EP colleagues for consideration of more advanced management of her atrial fibrillation.  She is not anticoagulated secondary to history of GI bleed.  She does have watchman device. 2. Pacemaker present is a Medtronic device interrogation reviewed. 3. Mixed dyslipidemia: I did review her fasting lipid profile from September showing LDL 33 HDL 58, she is on pravastatin 40 which I will continue. 4. Chronic kidney failure last creatinine from September 16 1.7 which is stable.   Medication Adjustments/Labs and Tests Ordered: Current medicines are reviewed at length with the patient today.  Concerns regarding medicines are outlined above.  No orders of the defined types were placed in this encounter.  Medication changes: No orders of the defined types were placed in this encounter.   Signed, Park Liter, MD, [facility] 03/07/2020 8:48 AM    [place]

## 2020-03-07 NOTE — Patient Instructions (Signed)
Medication Instructions:  Your physician has recommended you make the following change in your medication:  START: metoprolol succinate 25 mg daily   *If you need a refill on your cardiac medications before your next appointment, please call your pharmacy*   Lab Work: None If you have labs (blood work) drawn today and your tests are completely normal, you will receive your results only by: Marland Kitchen MyChart Message (if you have MyChart) OR . A paper copy in the mail If you have any lab test that is abnormal or we need to change your treatment, we will call you to review the results.   Testing/Procedures: None   Follow-Up: At [facility], you and your health needs are our priority.  As part of our continuing mission to provide you with exceptional heart care, we have created designated Provider Care Teams.  These Care Teams include your primary Cardiologist (physician) and Advanced Practice Providers (APPs -  Physician Assistants and Nurse Practitioners) who all work together to provide you with the care you need, when you need it.  We recommend signing up for the patient portal called "MyChart".  Sign up information is provided on this After Visit Summary.  MyChart is used to connect with patients for Virtual Visits (Telemedicine).  Patients are able to view lab/test results, encounter notes, upcoming appointments, etc.  Non-urgent messages can be sent to your provider as well.   To learn more about what you can do with MyChart, go to [email or web address].    Your next appointment:   3 month(s)  The format for your next appointment:   In Person  Provider:   Jenne Campus, MD   Other Instructions  Metoprolol Extended-Release Tablets What is this medicine? METOPROLOL (me TOE proe lole) is a beta blocker. It decreases the amount of work your heart has to do and helps your heart beat regularly. It treats high blood pressure and/or prevent chest pain (also called angina). It also  treats heart failure. This medicine may be used for other purposes; ask your health care provider or pharmacist if you have questions. COMMON BRAND NAME(S): toprol, Toprol XL What should I tell my health care provider before I take this medicine? They need to know if you have any of these conditions:  diabetes  heart or vessel disease like slow heart rate, worsening heart failure, heart block, sick sinus syndrome or Raynaud's disease  kidney disease  liver disease  lung or breathing disease, like asthma or emphysema  pheochromocytoma  thyroid disease  an unusual or allergic reaction to metoprolol, other beta-blockers, medicines, foods, dyes, or preservatives  pregnant or trying to get pregnant  breast-feeding How should I use this medicine? Take this drug by mouth. Take it as directed on the prescription label at the same time every day. Take it with food. You may cut the tablet in half if it is scored (has a line in the middle of it). This may help you swallow the tablet if the whole tablet is too big. Be sure to take both halves. Do not take just one-half of the tablet. Keep taking it unless your health care provider tells you to stop. Talk to your health care provider about the use of this drug in children. While it may be prescribed for children as young as 6 for selected conditions, precautions do apply. Overdosage: If you think you have taken too much of this medicine contact a poison control center or emergency room at once. NOTE: This medicine is only  for you. Do not share this medicine with others. What if I miss a dose? If you miss a dose, take it as soon as you can. If it is almost time for your next dose, take only that dose. Do not take double or extra doses. What may interact with this medicine? This medicine may interact with the following medications:  certain medicines for blood pressure, heart disease, irregular heart beat  certain medicines for depression, like  monoamine oxidase (MAO) inhibitors, fluoxetine, or paroxetine  clonidine  dobutamine  epinephrine  isoproterenol  reserpine This list may not describe all possible interactions. Give your health care provider a list of all the medicines, herbs, non-prescription drugs, or dietary supplements you use. Also tell them if you smoke, drink alcohol, or use illegal drugs. Some items may interact with your medicine. What should I watch for while using this medicine? Visit your doctor or health care professional for regular check ups. Contact your doctor right away if your symptoms worsen. Check your blood pressure and pulse rate regularly. Ask your health care professional what your blood pressure and pulse rate should be, and when you should contact them. You may get drowsy or dizzy. Do not drive, use machinery, or do anything that needs mental alertness until you know how this medicine affects you. Do not sit or stand up quickly, especially if you are an older patient. This reduces the risk of dizzy or fainting spells. Contact your doctor if these symptoms continue. Alcohol may interfere with the effect of this medicine. Avoid alcoholic drinks. This medicine may increase blood sugar. Ask your healthcare provider if changes in diet or medicines are needed if you have diabetes. What side effects may I notice from receiving this medicine? Side effects that you should report to your doctor or health care professional as soon as possible:  allergic reactions like skin rash, itching or hives  cold or numb hands or feet  depression  difficulty breathing  faint  fever with sore throat  irregular heartbeat, chest pain  rapid weight gain  signs and symptoms of high blood sugar such as being more thirsty or hungry or having to urinate more than normal. You may also feel very tired or have blurry vision.  swollen legs or ankles Side effects that usually do not require medical attention (report to  your doctor or health care professional if they continue or are bothersome):  anxiety or nervousness  change in sex drive or performance  dry skin  headache  nightmares or trouble sleeping  short term memory loss  stomach upset or diarrhea This list may not describe all possible side effects. Call your doctor for medical advice about side effects. You may report side effects to FDA at 1-800-FDA-1088. Where should I keep my medicine? Keep out of the reach of children and pets. Store at room temperature between 20 and 25 degrees C (68 and 77 degrees F). Throw away any unused drug after the expiration date. NOTE: This sheet is a summary. It may not cover all possible information. If you have questions about this medicine, talk to your doctor, pharmacist, or health care provider.  2021 Elsevier/Gold Standard (2018-09-01 18:23:00)

## 2020-03-25 ENCOUNTER — Other Ambulatory Visit: Payer: Self-pay | Admitting: Cardiology

## 2020-03-25 NOTE — Telephone Encounter (Signed)
Refill sent to pharmacy.   

## 2020-04-01 ENCOUNTER — Ambulatory Visit (INDEPENDENT_AMBULATORY_CARE_PROVIDER_SITE_OTHER): Payer: Medicare Other | Admitting: Cardiology

## 2020-04-01 ENCOUNTER — Encounter: Payer: Self-pay | Admitting: Cardiology

## 2020-04-01 ENCOUNTER — Other Ambulatory Visit: Payer: Self-pay

## 2020-04-01 VITALS — BP 138/64 | HR 87 | Ht 63.0 in | Wt 184.0 lb

## 2020-04-01 DIAGNOSIS — I4819 Other persistent atrial fibrillation: Secondary | ICD-10-CM

## 2020-04-01 LAB — CUP PACEART INCLINIC DEVICE CHECK
Battery Remaining Longevity: 169 mo
Battery Voltage: 3.11 V
Brady Statistic AP VP Percent: 0.01 %
Brady Statistic AP VS Percent: 16.76 %
Brady Statistic AS VP Percent: 0.04 %
Brady Statistic AS VS Percent: 83.24 %
Brady Statistic RA Percent Paced: 5.86 %
Brady Statistic RV Percent Paced: 10.37 %
Date Time Interrogation Session: 20220228121100
Implantable Lead Implant Date: 20210322
Implantable Lead Implant Date: 20210322
Implantable Lead Location: 753859
Implantable Lead Location: 753860
Implantable Lead Model: 5076
Implantable Lead Model: 5076
Implantable Pulse Generator Implant Date: 20210322
Lead Channel Impedance Value: 323 Ohm
Lead Channel Impedance Value: 437 Ohm
Lead Channel Impedance Value: 475 Ohm
Lead Channel Impedance Value: 532 Ohm
Lead Channel Pacing Threshold Amplitude: 0.75 V
Lead Channel Pacing Threshold Pulse Width: 0.4 ms
Lead Channel Sensing Intrinsic Amplitude: 11.125 mV
Lead Channel Sensing Intrinsic Amplitude: 7.5 mV
Lead Channel Setting Pacing Amplitude: 1.5 V
Lead Channel Setting Pacing Amplitude: 2.5 V
Lead Channel Setting Pacing Pulse Width: 0.4 ms
Lead Channel Setting Sensing Sensitivity: 2 mV

## 2020-04-01 NOTE — Progress Notes (Signed)
Electrophysiology Office Note   Date:  04/01/2020   ID:  Amanda [name], DOB 1940-03-22, MRN [id number]  PCP:  Street, Sharon Mt, MD  Cardiologist: Agustin Cree Primary Electrophysiologist:  Will Meredith Leeds, MD    No chief complaint on file.    History of Present Illness: Amanda [name] is a 80 y.o. female who is being seen today for the evaluation of atrial fibrillation, syncope at the request of [facility]. Presenting today for electrophysiology evaluation.    She has past history significant for ischemic stroke, atrial fibrillation, CKD stage III, diabetes, hypertension, OSA on CPAP.  She has a watchman implanted due to recurrent GI bleeding.  She had an episode of syncope.  She wore a cardiac monitor that showed prolonged pauses.  She is now status post Medtronic dual-chamber pacemaker implanted 04/24/2019 with explant of her Linq monitor.  Today, denies symptoms of palpitations, chest pain, shortness of breath, orthopnea, PND, lower extremity edema, claudication, dizziness, presyncope, syncope, bleeding, or neurologic sequela. The patient is tolerating medications without difficulties.  Unfortunately, she feels weak and fatigued.  She has been in atrial fibrillation for the last few months.  She was unaware when she went into atrial fibrillation.  She does not have much shortness of breath.  She would like to get back into sinus rhythm.  Past Medical History:  Diagnosis Date  . Acute ischemic stroke ([place])   . Acute metabolic encephalopathy   . Acute on chronic systolic (congestive) heart failure ([place])   . Acute renal insufficiency 09/02/2016  . Anemia due to stage 4 chronic kidney disease ([place]) 04/04/2015  . Atrial fibrillation ([place])   . Benign hypertension with CKD (chronic kidney disease) stage IV ([place]) 04/04/2015  . Bradycardia 09/17/2014  . Chest pain 06/23/2016   Overview:  Added automatically from request for surgery [id number]  . Chronic anemia   . Chronic kidney  disease (CKD), stage III (moderate) (HCC)   . Coronary artery disease 10/12/2016   Non drug-eluting stent implanted in June 2018 to mid RCA   08/08/2016 10:37  Angiographic Findings  Cardiac Arteries and Lesion Findings LMCA: 0% and Normal. LAD: 0% and Normal. RCA: Lesion on Mid RCA: Mid subsection.95% stenosis reduced to 0%. Pre procedure TIMI II flow was noted. Post Procedure TIMI III flow was present. Poor run off was present. The lesion was diagnosed as High Risk (C).   . 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 ([place]) 04/04/2015  . Diabetes mellitus without complication ([place])   . Diverticulosis   . Dyslipidemia 09/17/2014  . Gastroesophageal reflux   . GI bleed 08/06/2016  . Heart block AV complete ([place]) 09/05/2019  . History of cardiac monitoring 02/23/2018   monitor inserted  . Hyperlipidemia   . Hypertensive heart disease with heart failure ([place])   . Iron deficiency anemia 12/28/2017  . LV dysfunction 09/02/2016  . Myocardial infarction ([place])   . NSTEMI (non-ST elevated myocardial infarction) ([place]) 08/06/2016  . Obstructive sleep apnea 09/17/2014  . Orthostatic hypotension 12/28/2017  . OSA on CPAP   . 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 Postural dizziness with presyncope 09/23/2017  . 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 Renal failure, chronic, stage 3 (moderate) (HCC)   . Syncope 11/04/2017  . Systolic heart failure ([place])   . Thyroid disease   . Vitamin D deficiency 04/04/2015   Past Surgical History:  Procedure Laterality Date  .  CARDIAC CATHETERIZATION    . COLONOSCOPY  11/21/2014   Moderate predominantly sigmoid diverticulosis. Otherwise noraml collonscopy to TI.   Marland Kitchen CORONARY ANGIOPLASTY WITH STENT PLACEMENT  08/2016  . EXCISIONAL HEMORRHOIDECTOMY    . LEFT ATRIAL APPENDAGE OCCLUSION  11/2016   in [place]  . LOOP RECORDER INSERTION N/A 02/23/2018   Procedure: LOOP  RECORDER INSERTION;  Surgeon: Constance Haw, MD;  Location: [facility];  Service: Cardiovascular;  Laterality: N/A;  . LOOP RECORDER REMOVAL N/A 04/24/2019   Procedure: LOOP RECORDER REMOVAL;  Surgeon: Constance Haw, MD;  Location: [facility];  Service: Cardiovascular;  Laterality: N/A;  . NOSE SURGERY    . PACEMAKER IMPLANT N/A 04/24/2019   Procedure: PACEMAKER IMPLANT;  Surgeon: Constance Haw, MD;  Location: [facility];  Service: Cardiovascular;  Laterality: N/A;     Current Outpatient Medications  Medication Sig Dispense Refill  . acetaminophen (TYLENOL) 500 MG tablet Take 1,000 mg by mouth every 6 (six) hours as needed for moderate pain or headache.    . calcium carbonate (TUMS - DOSED IN MG ELEMENTAL CALCIUM) 500 MG chewable tablet Chew 1-2 tablets by mouth daily as needed for indigestion or heartburn.    . Carboxymethylcellul-Glycerin (REFRESH OPTIVE OP) Place 1 drop into both eyes 4 (four) times daily as needed (dry eyes).    . Cholecalciferol (VITAMIN D3) 50 MCG (2000 UT) TABS Take 2,000 Units by mouth daily at 12 noon.    . clopidogrel (PLAVIX) 75 MG tablet Take 75 mg by mouth daily.    . Coenzyme Q10 100 MG TABS Take 100 mg by mouth daily at 12 noon.     . colchicine 0.6 MG tablet Take 0.6-1.2 mg by mouth See admin instructions. 1.'2mg'$  at onset of gout attack, then 0.'6mg'$  an hour later. Next day 0.'6mg'$  twice a day, then 0.'6mg'$  daily until gout pain subsides.    Marland Kitchen estradiol (ESTRACE) 0.1 MG/GM vaginal cream Place 1 Applicatorful vaginally every Monday, Wednesday, and Friday.     . ezetimibe (ZETIA) 10 MG tablet TAKE 1 TABLET BY MOUTH EVERY EVENING. 90 tablet 3  . febuxostat (ULORIC) 40 MG tablet Take 40 mg by mouth every evening.     . ferrous sulfate 325 (65 FE) MG EC tablet Take 325 mg by mouth 2 (two) times daily.     . folic acid (FOLVITE) Q000111Q MCG tablet Take 800 mcg by mouth daily at 12 noon.    . furosemide (LASIX) 20 MG tablet Take 20-40  mg by mouth See admin instructions. Take 2 tablets (40 mg) by mouth daily in the morning & 1 tablet (20 mg) by mouth at noon    . Glucosamine-Chondroitin (OSTEO BI-FLEX REGULAR STRENGTH PO) Take 1 tablet by mouth daily at 12 noon.    . isosorbide mononitrate (IMDUR) 30 MG 24 hr tablet TAKE 1 TABLET BY MOUTH DAILY. 90 tablet 3  . levothyroxine (SYNTHROID, LEVOTHROID) 50 MCG tablet Take 50 mcg by mouth daily before breakfast.    . metoprolol succinate (TOPROL-XL) 25 MG 24 hr tablet Take 1 tablet (25 mg total) by mouth daily. Take with or immediately following a meal. 90 tablet 1  . nitroGLYCERIN (NITROSTAT) 0.4 MG SL tablet Place 0.4 mg under the tongue every 5 (five) minutes as needed for chest pain.    Marland Kitchen omega-3 acid ethyl esters (LOVAZA) 1 g capsule TAKE 2 CAPSULES BY MOUTH DAILY 60 capsule 3  . pantoprazole (PROTONIX) 40 MG tablet TAKE  1 TABLET BY MOUTH DAILY 90 tablet 1  . polyethylene glycol powder (GLYCOLAX/MIRALAX) powder Take 17 g by mouth every other day. Alternating between miralax and benefiber    . pravastatin (PRAVACHOL) 40 MG tablet Take 40 mg by mouth every evening.     . Probiotic Product (PRO-BIOTIC BLEND PO) Take 1 capsule by mouth daily at 12 noon.     . ranolazine (RANEXA) 1000 MG SR tablet TAKE 1 TABLET BY MOUTH 2 TIMES DAILY. 180 tablet 0  . REPATHA SURECLICK [id number] MG/ML SOAJ INJECT 140 MG INTO THE SKIN EVERY 14 DAYS. 2 mL 10  . traMADol (ULTRAM) 50 MG tablet Take 50 mg by mouth every 12 (twelve) hours as needed for moderate pain.     . vitamin B-12 (CYANOCOBALAMIN) 1000 MCG tablet Take 1,000 mcg by mouth daily at 12 noon.    . Wheat Dextrin (BENEFIBER PO) Take 1 Dose by mouth every other day. Alternating between benefiber & miralax    . zolpidem (AMBIEN) 10 MG tablet Take 10 mg by mouth at bedtime as needed for sleep.     Current Facility-Administered Medications  Medication Dose Route Frequency Provider Last Rate Last Admin  . 0.9 %  sodium chloride infusion  500 mL  Intravenous Once Jackquline Denmark, MD        Allergies:   Ciprofloxacin, Contrast media [iodinated diagnostic agents], Nsaids, and Statins   Social History:  The patient  reports that she has quit smoking. She has never used smokeless tobacco. She reports that she does not drink alcohol and does not use drugs.   Family History:  The patient's family history includes Breast cancer in her mother; Cancer in her maternal grandfather; Diabetes in her sister; Heart attack in her brother, maternal uncle, and sister; Hypertension in her father; Prostate cancer in her father; Stroke in her father.   ROS:  Please see the history of present illness.   Otherwise, review of systems is positive for none.   All other systems are reviewed and negative.   PHYSICAL EXAM: VS:  BP 138/64   Pulse 87   Ht '5\' 3"'$  (1.6 m)   Wt 184 lb (83.5 kg)   SpO2 99%   BMI 32.59 kg/m  , BMI Body mass index is 32.59 kg/m. GEN: Well nourished, well developed, in no acute distress  HEENT: normal  Neck: no JVD, carotid bruits, or masses Cardiac: Irregular; no murmurs, rubs, or gallops,no edema  Respiratory:  clear to auscultation bilaterally, normal work of breathing GI: soft, nontender, nondistended, + BS MS: no deformity or atrophy  Skin: warm and dry, device site well healed Neuro:  Strength and sensation are intact Psych: euthymic mood, full affect  EKG:  EKG is ordered today. Personal review of the ekg ordered shows atrial flutter, rate 87  Personal review of the device interrogation today. Results in [facility]: 04/24/2019: BUN 48; Creatinine, Ser 2.03; Hemoglobin 12.5; Platelets 217; Potassium 4.3; Sodium 141    Lipid Panel     Component Value Date/Time   CHOL 125 09/05/2019 0842   TRIG 162 (H) 09/05/2019 0842   HDL 59 09/05/2019 0842   CHOLHDL 2.1 09/05/2019 0842   LDLCALC 39 09/05/2019 0842     Wt Readings from Last 3 Encounters:  04/01/20 184 lb (83.5 kg)  03/07/20 182 lb (82.6 kg)   09/05/19 178 lb 12.8 oz (81.1 kg)      Other studies Reviewed: Additional studies/ records that were reviewed today include:  TTE 12/22/2017 Review of the above records today demonstrates:  - Left ventricle: The cavity size was normal. Wall thickness was   increased in a pattern of moderate LVH. Systolic function was   normal. The estimated ejection fraction was in the range of 60%   to 65%. Wall motion was normal; there were no regional wall   motion abnormalities. Features are consistent with a pseudonormal   left ventricular filling pattern, with concomitant abnormal   relaxation and increased filling pressure (grade 2 diastolic   dysfunction). - Aortic valve: There was mild stenosis. Valve area (VTI): 1.57   cm^2. Valve area (Vmax): 1.45 cm^2. Valve area (Vmean): 1.57   cm^2. - Mitral valve: There was mild regurgitation. Valve area by   pressure half-time: 2.34 cm^2. - Left atrium: The atrium was moderately dilated.   ASSESSMENT AND PLAN:  1.  Persistent atrial fibrillation: Status post watchman and thus not anticoagulated.  CHA2DS2-VASc of 7.  Unfortunately she is in atrial fibrillation today and has been for the last few months.  She has weakness and fatigue.  She went get back into normal rhythm.  She will likely need a cardioversion and potentially medication management as well.  I will discuss this with our watchman implant her that she has a watchman to see if she needs anticoagulation around the time of her cardioversion.  2.  Obstructive sleep apnea: CPAP compliance encouraged  3.  Hypertension: Currently well controlled  4.  Syncope with complete heart block: Long pauses noted on cardiac monitor.  Status post Medtronic dual-chamber pacemaker implanted 04/24/2019.  Device functioning appropriately.  No changes at this time.    Current medicines are reviewed at length with the patient today.   The patient does not have concerns regarding her medicines.  The following  changes were made today: None  Labs/ tests ordered today include:  Orders Placed This Encounter  Procedures  . EKG 12-Lead    Disposition:   FU with Will Camnitz 3 months  Signed, Will Meredith Leeds, MD  04/01/2020 12:31 PM     [address] [place] [address] [phone] (office) [phone] (fax)

## 2020-04-01 NOTE — Patient Instructions (Signed)
Medication Instructions:  Your physician recommends that you continue on your current medications as directed. Please refer to the Current Medication list given to you today.  *If you need a refill on your cardiac medications before your next appointment, please call your pharmacy*   Lab Work: None ordered If you have labs (blood work) drawn today and your tests are completely normal, you will receive your results only by: Marland Kitchen MyChart Message (if you have MyChart) OR . A paper copy in the mail If you have any lab test that is abnormal or we need to change your treatment, we will call you to review the results.   Testing/Procedures: None ordered   Follow-Up: At [facility], you and your health needs are our priority.  As part of our continuing mission to provide you with exceptional heart care, we have created designated Provider Care Teams.  These Care Teams include your primary Cardiologist (physician) and Advanced Practice Providers (APPs -  Physician Assistants and Nurse Practitioners) who all work together to provide you with the care you need, when you need it.  Remote monitoring is used to monitor your Pacemaker or ICD from home. This monitoring reduces the number of office visits required to check your device to one time per year. It allows [place] to keep an eye on the functioning of your device to ensure it is working properly. You are scheduled for a device check from home on 04/22/2020. You may send your transmission at any time that day. If you have a wireless device, the transmission will be sent automatically. After your physician reviews your transmission, you will receive a postcard with your next transmission date.  Your next appointment:    to be determined  The format for your next appointment:   In Person  Provider:   Allegra Lai, MD   Thank you for choosing [facility]!!   Trinidad Curet, RN [phone]    Other Instructions

## 2020-04-17 ENCOUNTER — Encounter: Payer: Self-pay | Admitting: Sports Medicine

## 2020-04-17 ENCOUNTER — Ambulatory Visit: Payer: Medicare Other | Admitting: Sports Medicine

## 2020-04-17 ENCOUNTER — Other Ambulatory Visit: Payer: Self-pay

## 2020-04-17 DIAGNOSIS — L603 Nail dystrophy: Secondary | ICD-10-CM

## 2020-04-17 DIAGNOSIS — L601 Onycholysis: Secondary | ICD-10-CM | POA: Diagnosis not present

## 2020-04-17 DIAGNOSIS — E039 Hypothyroidism, unspecified: Secondary | ICD-10-CM | POA: Diagnosis not present

## 2020-04-17 DIAGNOSIS — M79674 Pain in right toe(s): Secondary | ICD-10-CM

## 2020-04-17 DIAGNOSIS — N184 Chronic kidney disease, stage 4 (severe): Secondary | ICD-10-CM | POA: Diagnosis not present

## 2020-04-17 DIAGNOSIS — Z79899 Other long term (current) drug therapy: Secondary | ICD-10-CM | POA: Diagnosis not present

## 2020-04-17 DIAGNOSIS — E1122 Type 2 diabetes mellitus with diabetic chronic kidney disease: Secondary | ICD-10-CM | POA: Diagnosis not present

## 2020-04-17 NOTE — Progress Notes (Signed)
Subjective: Amanda [name] is a 80 y.o. female patient seen today in office for follow-up evaluation of right great toenail.  Patient reports that a week ago she hit her nail against the car door as she was pushing to get in and the nail split loose reports that this is the same nail she has had issues with discoloration with and has been using tea tree oil but became concerned when the nail split.  Patient also reports that she is taking biotin denies any significant redness warmth swelling or drainage from the right great toe.  No other pedal complaints noted.  Patient Active Problem List   Diagnosis Date Noted  . Thyroid disease   . Systolic heart failure ([place])   . Renal failure, chronic, stage 3 (moderate) (HCC)   . OSA on CPAP   . Myocardial infarction ([place])   . Hypertensive heart disease with heart failure ([place])   . Hyperlipidemia   . Gastroesophageal reflux   . Diverticulosis   . Diabetes mellitus without complication ([place])   . Chronic kidney disease (CKD), stage III (moderate) (HCC)   . Chronic anemia   . Acute on chronic systolic (congestive) heart failure ([place])   . Acute metabolic encephalopathy   . Acute ischemic stroke ([place])   . 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 Heart block AV complete ([place]) 09/05/2019  . History of cardiac monitoring 02/23/2018  . Iron deficiency anemia 12/28/2017  . Orthostatic hypotension 12/28/2017  . Syncope 11/04/2017  . Postural dizziness with presyncope 09/23/2017  . 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 Coronary artery disease 10/12/2016  . Acute renal insufficiency 09/02/2016  . LV dysfunction 09/02/2016  . GI bleed 08/06/2016  . NSTEMI (non-ST elevated myocardial infarction) ([place]) 08/06/2016  . Abnormal nuclear stress test 06/23/2016  . Chest pain 06/23/2016  . Anemia due to stage 4 chronic kidney disease ([place]) 04/04/2015  . Benign hypertension with CKD (chronic kidney disease) stage IV ([place])  04/04/2015  . 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 Vitamin D deficiency 04/04/2015  . 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 Atrial fibrillation ([place]) 09/17/2014  . Bradycardia 09/17/2014  . Dyslipidemia 09/17/2014  . Obstructive sleep apnea 09/17/2014    Current Outpatient Medications on File Prior to Visit  Medication Sig Dispense Refill  . acetaminophen (TYLENOL) 500 MG tablet Take 1,000 mg by mouth every 6 (six) hours as needed for moderate pain or headache.    . calcium carbonate (TUMS - DOSED IN MG ELEMENTAL CALCIUM) 500 MG chewable tablet Chew 1-2 tablets by mouth daily as needed for indigestion or heartburn.    . Carboxymethylcellul-Glycerin (REFRESH OPTIVE OP) Place 1 drop into both eyes 4 (four) times daily as needed (dry eyes).    . Cholecalciferol (VITAMIN D3) 50 MCG (2000 UT) TABS Take 2,000 Units by mouth daily at 12 noon.    . clopidogrel (PLAVIX) 75 MG tablet Take 75 mg by mouth daily.    . Coenzyme Q10 100 MG TABS Take 100 mg by mouth daily at 12 noon.     . colchicine 0.6 MG tablet Take 0.6-1.2 mg by mouth See admin instructions. 1.'2mg'$  at onset of gout attack, then 0.'6mg'$  an hour later. Next day 0.'6mg'$  twice a day, then 0.'6mg'$  daily until gout pain subsides.    Marland Kitchen estradiol (ESTRACE) 0.1 MG/GM vaginal cream Place 1 Applicatorful vaginally every Monday, Wednesday, and Friday.     . ezetimibe (ZETIA)  10 MG tablet TAKE 1 TABLET BY MOUTH EVERY EVENING. 90 tablet 3  . febuxostat (ULORIC) 40 MG tablet Take 40 mg by mouth every evening.     . ferrous sulfate 325 (65 FE) MG EC tablet Take 325 mg by mouth 2 (two) times daily.     . folic acid (FOLVITE) Q000111Q MCG tablet Take 800 mcg by mouth daily at 12 noon.    . furosemide (LASIX) 20 MG tablet Take 20-40 mg by mouth See admin instructions. Take 2 tablets (40 mg) by mouth daily in the morning & 1 tablet (20 mg) by mouth at noon    . Glucosamine-Chondroitin (OSTEO BI-FLEX REGULAR  STRENGTH PO) Take 1 tablet by mouth daily at 12 noon.    . isosorbide mononitrate (IMDUR) 30 MG 24 hr tablet TAKE 1 TABLET BY MOUTH DAILY. 90 tablet 3  . levothyroxine (SYNTHROID, LEVOTHROID) 50 MCG tablet Take 50 mcg by mouth daily before breakfast.    . metoprolol succinate (TOPROL-XL) 25 MG 24 hr tablet Take 1 tablet (25 mg total) by mouth daily. Take with or immediately following a meal. 90 tablet 1  . nitroGLYCERIN (NITROSTAT) 0.4 MG SL tablet Place 0.4 mg under the tongue every 5 (five) minutes as needed for chest pain.    Marland Kitchen omega-3 acid ethyl esters (LOVAZA) 1 g capsule TAKE 2 CAPSULES BY MOUTH DAILY 60 capsule 3  . pantoprazole (PROTONIX) 40 MG tablet TAKE 1 TABLET BY MOUTH DAILY 90 tablet 1  . polyethylene glycol powder (GLYCOLAX/MIRALAX) powder Take 17 g by mouth every other day. Alternating between miralax and benefiber    . pravastatin (PRAVACHOL) 40 MG tablet Take 40 mg by mouth every evening.     . Probiotic Product (PRO-BIOTIC BLEND PO) Take 1 capsule by mouth daily at 12 noon.     . ranolazine (RANEXA) 1000 MG SR tablet TAKE 1 TABLET BY MOUTH 2 TIMES DAILY. 180 tablet 0  . REPATHA SURECLICK [id number] MG/ML SOAJ INJECT 140 MG INTO THE SKIN EVERY 14 DAYS. 2 mL 10  . traMADol (ULTRAM) 50 MG tablet Take 50 mg by mouth every 12 (twelve) hours as needed for moderate pain.     . vitamin B-12 (CYANOCOBALAMIN) 1000 MCG tablet Take 1,000 mcg by mouth daily at 12 noon.    . Wheat Dextrin (BENEFIBER PO) Take 1 Dose by mouth every other day. Alternating between benefiber & miralax    . zolpidem (AMBIEN) 10 MG tablet Take 10 mg by mouth at bedtime as needed for sleep.     Current Facility-Administered Medications on File Prior to Visit  Medication Dose Route Frequency Provider Last Rate Last Admin  . 0.9 %  sodium chloride infusion  500 mL Intravenous Once Jackquline Denmark, MD        Allergies  Allergen Reactions  . Ciprofloxacin Other (See Comments)    Achilles tendon pain  . Contrast Media  [Iodinated Diagnostic Agents]     Avoid due to stage 4 kidney disease   . Nsaids     Avoid due to stage 4 kidney disease   . Statins Other (See Comments)    Joint pain, tolerates pravastatin     Objective: Physical Exam  General: Well developed, nourished, no acute distress, awake, alert and oriented x 3  Vascular: Dorsalis pedis artery 1/4 bilateral, Posterior tibial artery 1/4 bilateral, skin temperature warm to warm proximal to distal bilateral lower extremities, mild varicosities with venous hyperpigmentation bilateral, no pedal hair present bilateral.  Neurological: Gross sensation  present via light touch bilateral.   Dermatological: Skin is warm, dry, and supple bilateral, Nails 1-10 are well manicured however at the proximal nail fold there is discoloration white in nature at the base of the right hallux nail likely resembling old trauma versus concern for nail fungus with nail lifting of the right hallux nail from patient hitting the toe nail against the car door there is no surrounding redness swelling warmth or drainage no acute signs of infection.  Musculoskeletal: No symptomatic symptomatic boney deformities noted bilateral. Muscular strength within normal limits without painon range of motion. No pain with calf compression bilateral.  Assessment and Plan:  Problem List Items Addressed This Visit   None   Visit Diagnoses    Onycholysis    -  Primary   Nail dystrophy       Toe pain, right          -Examined patient -Discussed treatment options for dystrophic nail with lifting -Mechanically debrided loose nail using a sterile nail nipper without incident and then use a trim all to smooth the nail down with improvement in appearance -Advised patient to continue to monitor and to be very careful to not reinjure the toe again -May continue with biotin -May use tea tree oil as directed as the toenail continues to grow out -Advised patient if toenail fails to continue to  improve may benefit from nail removal -Patient to return as needed or sooner if symptoms worsen.  Landis Martins, DPM

## 2020-04-22 ENCOUNTER — Ambulatory Visit (INDEPENDENT_AMBULATORY_CARE_PROVIDER_SITE_OTHER): Payer: Medicare Other

## 2020-04-22 DIAGNOSIS — I442 Atrioventricular block, complete: Secondary | ICD-10-CM

## 2020-04-23 LAB — CUP PACEART REMOTE DEVICE CHECK
Battery Remaining Longevity: 168 mo
Battery Voltage: 3.1 V
Brady Statistic RA Percent Paced: 0.05 %
Brady Statistic RV Percent Paced: 21.1 %
Date Time Interrogation Session: 20220321000455
Implantable Lead Implant Date: 20210322
Implantable Lead Implant Date: 20210322
Implantable Lead Location: 753859
Implantable Lead Location: 753860
Implantable Lead Model: 5076
Implantable Lead Model: 5076
Implantable Pulse Generator Implant Date: 20210322
Lead Channel Impedance Value: 323 Ohm
Lead Channel Impedance Value: 399 Ohm
Lead Channel Impedance Value: 456 Ohm
Lead Channel Impedance Value: 532 Ohm
Lead Channel Pacing Threshold Amplitude: 0.625 V
Lead Channel Pacing Threshold Amplitude: 0.625 V
Lead Channel Pacing Threshold Pulse Width: 0.4 ms
Lead Channel Pacing Threshold Pulse Width: 0.4 ms
Lead Channel Sensing Intrinsic Amplitude: 4.875 mV
Lead Channel Sensing Intrinsic Amplitude: 4.875 mV
Lead Channel Sensing Intrinsic Amplitude: 9.125 mV
Lead Channel Sensing Intrinsic Amplitude: 9.125 mV
Lead Channel Setting Pacing Amplitude: 1.5 V
Lead Channel Setting Pacing Amplitude: 2.5 V
Lead Channel Setting Pacing Pulse Width: 0.4 ms
Lead Channel Setting Sensing Sensitivity: 2 mV

## 2020-04-25 ENCOUNTER — Telehealth: Payer: Self-pay | Admitting: *Deleted

## 2020-04-25 DIAGNOSIS — Z01812 Encounter for preprocedural laboratory examination: Secondary | ICD-10-CM

## 2020-04-25 DIAGNOSIS — N184 Chronic kidney disease, stage 4 (severe): Secondary | ICD-10-CM | POA: Diagnosis not present

## 2020-04-25 DIAGNOSIS — E1122 Type 2 diabetes mellitus with diabetic chronic kidney disease: Secondary | ICD-10-CM | POA: Diagnosis not present

## 2020-04-25 DIAGNOSIS — E559 Vitamin D deficiency, unspecified: Secondary | ICD-10-CM | POA: Diagnosis not present

## 2020-04-25 DIAGNOSIS — I4819 Other persistent atrial fibrillation: Secondary | ICD-10-CM

## 2020-04-25 DIAGNOSIS — I129 Hypertensive chronic kidney disease with stage 1 through stage 4 chronic kidney disease, or unspecified chronic kidney disease: Secondary | ICD-10-CM | POA: Diagnosis not present

## 2020-04-25 NOTE — Telephone Encounter (Signed)
Pt aware/agreeable to TEE/DCCV (pt has Watchman).  She would prefer to have done at [facility] is possible.  Will forward to Dr. Wendy Poet nurse to see if she can aid in scheduling.  Hildred Alamin, I am not in the office tomorrow.  If you are ok to arrange that would be great.  If not please let me know and pt aware I will call her next week  (if no, please let me know when Dr Raliegh Ip is in hospital for me to arrange w/ him) Thanks girl

## 2020-04-26 NOTE — Telephone Encounter (Signed)
Patient states she would like to have her procedure at [facility].

## 2020-04-29 NOTE — Telephone Encounter (Signed)
Pt aware I would be in touch to arrange DCCV at [facility]. Patient verbalized understanding and agreeable to plan.

## 2020-04-29 NOTE — Progress Notes (Signed)
Remote pacemaker transmission.   

## 2020-04-30 ENCOUNTER — Other Ambulatory Visit: Payer: Self-pay | Admitting: Cardiology

## 2020-04-30 NOTE — Telephone Encounter (Signed)
Refill sent to pharmacy.   

## 2020-05-01 NOTE — Telephone Encounter (Signed)
Patient is returning call.  °

## 2020-05-01 NOTE — Telephone Encounter (Signed)
Left message to call back  

## 2020-05-03 DIAGNOSIS — I10 Essential (primary) hypertension: Secondary | ICD-10-CM | POA: Diagnosis not present

## 2020-05-03 DIAGNOSIS — N390 Urinary tract infection, site not specified: Secondary | ICD-10-CM | POA: Diagnosis not present

## 2020-05-03 DIAGNOSIS — I499 Cardiac arrhythmia, unspecified: Secondary | ICD-10-CM | POA: Diagnosis not present

## 2020-05-03 DIAGNOSIS — I447 Left bundle-branch block, unspecified: Secondary | ICD-10-CM | POA: Diagnosis not present

## 2020-05-03 DIAGNOSIS — R0902 Hypoxemia: Secondary | ICD-10-CM | POA: Diagnosis not present

## 2020-05-03 DIAGNOSIS — I517 Cardiomegaly: Secondary | ICD-10-CM | POA: Diagnosis not present

## 2020-05-03 DIAGNOSIS — R4182 Altered mental status, unspecified: Secondary | ICD-10-CM | POA: Diagnosis not present

## 2020-05-03 DIAGNOSIS — J189 Pneumonia, unspecified organism: Secondary | ICD-10-CM | POA: Diagnosis not present

## 2020-05-04 DIAGNOSIS — I482 Chronic atrial fibrillation, unspecified: Secondary | ICD-10-CM | POA: Diagnosis not present

## 2020-05-04 DIAGNOSIS — I2510 Atherosclerotic heart disease of native coronary artery without angina pectoris: Secondary | ICD-10-CM | POA: Diagnosis not present

## 2020-05-04 DIAGNOSIS — J189 Pneumonia, unspecified organism: Secondary | ICD-10-CM | POA: Diagnosis not present

## 2020-05-04 DIAGNOSIS — R4182 Altered mental status, unspecified: Secondary | ICD-10-CM | POA: Diagnosis not present

## 2020-05-04 DIAGNOSIS — J159 Unspecified bacterial pneumonia: Secondary | ICD-10-CM | POA: Diagnosis not present

## 2020-05-04 DIAGNOSIS — I10 Essential (primary) hypertension: Secondary | ICD-10-CM | POA: Diagnosis not present

## 2020-05-04 DIAGNOSIS — Z7902 Long term (current) use of antithrombotics/antiplatelets: Secondary | ICD-10-CM | POA: Diagnosis not present

## 2020-05-04 DIAGNOSIS — E1122 Type 2 diabetes mellitus with diabetic chronic kidney disease: Secondary | ICD-10-CM | POA: Diagnosis not present

## 2020-05-04 DIAGNOSIS — I509 Heart failure, unspecified: Secondary | ICD-10-CM | POA: Diagnosis not present

## 2020-05-04 DIAGNOSIS — I130 Hypertensive heart and chronic kidney disease with heart failure and stage 1 through stage 4 chronic kidney disease, or unspecified chronic kidney disease: Secondary | ICD-10-CM | POA: Diagnosis not present

## 2020-05-04 DIAGNOSIS — Z7901 Long term (current) use of anticoagulants: Secondary | ICD-10-CM | POA: Diagnosis not present

## 2020-05-04 DIAGNOSIS — E785 Hyperlipidemia, unspecified: Secondary | ICD-10-CM | POA: Diagnosis not present

## 2020-05-04 DIAGNOSIS — G9341 Metabolic encephalopathy: Secondary | ICD-10-CM | POA: Diagnosis not present

## 2020-05-04 DIAGNOSIS — Z8673 Personal history of transient ischemic attack (TIA), and cerebral infarction without residual deficits: Secondary | ICD-10-CM | POA: Diagnosis not present

## 2020-05-04 DIAGNOSIS — E860 Dehydration: Secondary | ICD-10-CM | POA: Diagnosis not present

## 2020-05-04 DIAGNOSIS — I517 Cardiomegaly: Secondary | ICD-10-CM | POA: Diagnosis not present

## 2020-05-04 DIAGNOSIS — E1165 Type 2 diabetes mellitus with hyperglycemia: Secondary | ICD-10-CM | POA: Diagnosis not present

## 2020-05-04 DIAGNOSIS — N184 Chronic kidney disease, stage 4 (severe): Secondary | ICD-10-CM | POA: Diagnosis not present

## 2020-05-04 DIAGNOSIS — D649 Anemia, unspecified: Secondary | ICD-10-CM | POA: Diagnosis not present

## 2020-05-04 DIAGNOSIS — N390 Urinary tract infection, site not specified: Secondary | ICD-10-CM | POA: Diagnosis not present

## 2020-05-04 DIAGNOSIS — Z955 Presence of coronary angioplasty implant and graft: Secondary | ICD-10-CM | POA: Diagnosis not present

## 2020-05-06 ENCOUNTER — Other Ambulatory Visit: Payer: Self-pay | Admitting: *Deleted

## 2020-05-06 DIAGNOSIS — Z01812 Encounter for preprocedural laboratory examination: Secondary | ICD-10-CM

## 2020-05-06 DIAGNOSIS — I4819 Other persistent atrial fibrillation: Secondary | ICD-10-CM

## 2020-05-06 NOTE — Telephone Encounter (Addendum)
Followed up w/ pt, she reports she is currently at [facility] w/ UTI, expecting to be d/c today. Scheduled TEE/DCCV for next week, 4/14. Covid screening scheduled for 4/12. Will obtain recent blood work from hospital/PCP and have scanned into chart for upcoming procedure. Aware instructions for Covid testing and procedure will be sent via mychart today. Pt will call if she has any questions after reviewing. Aware office will call to arrange follow up w/ Dr. Curt Bears. Patient verbalized understanding and agreeable to plan.

## 2020-05-06 NOTE — Addendum Note (Signed)
Addended by: Stanton Kidney on: 05/06/2020 02:47 PM   Modules accepted: Orders

## 2020-05-14 ENCOUNTER — Other Ambulatory Visit (HOSPITAL_COMMUNITY)
Admission: RE | Admit: 2020-05-14 | Discharge: 2020-05-14 | Disposition: A | Discharge status: Home / Self Care | Payer: Medicare Other | Source: Ambulatory Visit | Attending: Internal Medicine | Admitting: Internal Medicine

## 2020-05-14 DIAGNOSIS — Z01812 Encounter for preprocedural laboratory examination: Secondary | ICD-10-CM | POA: Diagnosis not present

## 2020-05-14 DIAGNOSIS — Z20822 Contact with and (suspected) exposure to covid-19: Secondary | ICD-10-CM | POA: Diagnosis not present

## 2020-05-14 LAB — SARS CORONAVIRUS 2 (TAT 6-24 HRS): SARS Coronavirus 2: NEGATIVE

## 2020-05-16 ENCOUNTER — Encounter (HOSPITAL_COMMUNITY)
Admission: RE | Disposition: A | Discharge status: Home / Self Care | Payer: Self-pay | Source: Ambulatory Visit | Attending: Internal Medicine

## 2020-05-16 ENCOUNTER — Encounter (HOSPITAL_COMMUNITY): Payer: Self-pay | Admitting: Internal Medicine

## 2020-05-16 ENCOUNTER — Other Ambulatory Visit: Payer: Self-pay

## 2020-05-16 ENCOUNTER — Ambulatory Visit (HOSPITAL_BASED_OUTPATIENT_CLINIC_OR_DEPARTMENT_OTHER)
Admission: RE | Admit: 2020-05-16 | Discharge: 2020-05-16 | Disposition: A | Discharge status: Home / Self Care | Payer: Medicare Other | Source: Ambulatory Visit | Attending: Internal Medicine | Admitting: Internal Medicine

## 2020-05-16 ENCOUNTER — Ambulatory Visit (HOSPITAL_COMMUNITY): Payer: Medicare Other | Admitting: Certified Registered Nurse Anesthetist

## 2020-05-16 ENCOUNTER — Ambulatory Visit (HOSPITAL_COMMUNITY)
Admission: RE | Admit: 2020-05-16 | Discharge: 2020-05-16 | Disposition: A | Discharge status: Home / Self Care | Payer: Medicare Other | Source: Ambulatory Visit | Attending: Internal Medicine | Admitting: Internal Medicine

## 2020-05-16 DIAGNOSIS — I129 Hypertensive chronic kidney disease with stage 1 through stage 4 chronic kidney disease, or unspecified chronic kidney disease: Secondary | ICD-10-CM | POA: Insufficient documentation

## 2020-05-16 DIAGNOSIS — I340 Nonrheumatic mitral (valve) insufficiency: Secondary | ICD-10-CM | POA: Diagnosis not present

## 2020-05-16 DIAGNOSIS — K219 Gastro-esophageal reflux disease without esophagitis: Secondary | ICD-10-CM | POA: Diagnosis not present

## 2020-05-16 DIAGNOSIS — G9341 Metabolic encephalopathy: Secondary | ICD-10-CM | POA: Diagnosis not present

## 2020-05-16 DIAGNOSIS — G4733 Obstructive sleep apnea (adult) (pediatric): Secondary | ICD-10-CM | POA: Diagnosis not present

## 2020-05-16 DIAGNOSIS — I4819 Other persistent atrial fibrillation: Secondary | ICD-10-CM | POA: Diagnosis not present

## 2020-05-16 DIAGNOSIS — I4891 Unspecified atrial fibrillation: Secondary | ICD-10-CM

## 2020-05-16 DIAGNOSIS — I361 Nonrheumatic tricuspid (valve) insufficiency: Secondary | ICD-10-CM

## 2020-05-16 DIAGNOSIS — N183 Chronic kidney disease, stage 3 unspecified: Secondary | ICD-10-CM | POA: Insufficient documentation

## 2020-05-16 DIAGNOSIS — E559 Vitamin D deficiency, unspecified: Secondary | ICD-10-CM | POA: Diagnosis not present

## 2020-05-16 DIAGNOSIS — E1122 Type 2 diabetes mellitus with diabetic chronic kidney disease: Secondary | ICD-10-CM | POA: Diagnosis not present

## 2020-05-16 HISTORY — PX: TEE WITHOUT CARDIOVERSION: SHX5443

## 2020-05-16 HISTORY — PX: CARDIOVERSION: SHX1299

## 2020-05-16 HISTORY — PX: BUBBLE STUDY: SHX6837

## 2020-05-16 LAB — POCT I-STAT, CHEM 8
BUN: 31 mg/dL — ABNORMAL HIGH (ref 8–23)
BUN: 44 mg/dL — ABNORMAL HIGH (ref 8–23)
Calcium, Ion: 1.05 mmol/L — ABNORMAL LOW (ref 1.15–1.40)
Calcium, Ion: 1.37 mmol/L (ref 1.15–1.40)
Chloride: 119 mmol/L — ABNORMAL HIGH (ref 98–111)
Chloride: 111 mmol/L (ref 98–111)
Creatinine, Ser: 1 mg/dL (ref 0.44–1.00)
Creatinine, Ser: 1.6 mg/dL — ABNORMAL HIGH (ref 0.44–1.00)
Glucose, Bld: 89 mg/dL (ref 70–99)
Glucose, Bld: 124 mg/dL — ABNORMAL HIGH (ref 70–99)
HCT: 21 % — ABNORMAL LOW (ref 36.0–46.0)
HCT: 31 % — ABNORMAL LOW (ref 36.0–46.0)
Hemoglobin: 7.1 g/dL — ABNORMAL LOW (ref 12.0–15.0)
Hemoglobin: 10.5 g/dL — ABNORMAL LOW (ref 12.0–15.0)
Potassium: 2.9 mmol/L — ABNORMAL LOW (ref 3.5–5.1)
Potassium: 4.5 mmol/L (ref 3.5–5.1)
Sodium: 147 mmol/L — ABNORMAL HIGH (ref 135–145)
Sodium: 142 mmol/L (ref 135–145)
TCO2: 16 mmol/L — ABNORMAL LOW (ref 22–32)
TCO2: 23 mmol/L (ref 22–32)

## 2020-05-16 LAB — GLUCOSE, CAPILLARY: Glucose-Capillary: 112 mg/dL — ABNORMAL HIGH (ref 70–99)

## 2020-05-16 SURGERY — ECHOCARDIOGRAM, TRANSESOPHAGEAL
Anesthesia: Monitor Anesthesia Care

## 2020-05-16 MED ORDER — BUTAMBEN-TETRACAINE-BENZOCAINE 2-2-14 % EX AERO
INHALATION_SPRAY | CUTANEOUS | Status: DC | PRN
  Administered 2020-05-16: 2 via TOPICAL

## 2020-05-16 MED ORDER — LIDOCAINE 2% (20 MG/ML) 5 ML SYRINGE
INTRAMUSCULAR | Status: DC | PRN
  Administered 2020-05-16: 100 mg via INTRAVENOUS

## 2020-05-16 MED ORDER — SODIUM CHLORIDE 0.9 % IV SOLN
INTRAVENOUS | Status: DC
  Administered 2020-05-16: 1000 mL via INTRAVENOUS

## 2020-05-16 MED ORDER — PROPOFOL 500 MG/50ML IV EMUL
INTRAVENOUS | Status: DC | PRN
  Administered 2020-05-16 (×2): 100 ug/kg/min via INTRAVENOUS

## 2020-05-16 MED ORDER — PROPOFOL 10 MG/ML IV BOLUS
INTRAVENOUS | Status: DC | PRN
  Administered 2020-05-16: 10 mg via INTRAVENOUS
  Administered 2020-05-16: 40 mg via INTRAVENOUS
  Administered 2020-05-16: 20 mg via INTRAVENOUS

## 2020-05-16 NOTE — CV Procedure (Signed)
INDICATIONS: Afib, not on anticoagulation, prior watchman.   PROCEDURE:   Informed consent was obtained prior to the procedure. The risks, benefits and alternatives for the procedure were discussed and the patient comprehended these risks.  Risks include, but are not limited to, cough, sore throat, vomiting, nausea, somnolence, esophageal and stomach trauma or perforation, bleeding, low blood pressure, aspiration, pneumonia, infection, trauma to the teeth and death.    After a procedural time-out, the oropharynx was anesthetized with 20% benzocaine spray.   During this procedure the patient was administered propofol per anesthesia.  The patient's heart rate, blood pressure, and oxygen saturation were monitored continuously during the procedure. The period of conscious sedation was 45 minutes, of which I was present face-to-face 100% of this time.  The transesophageal probe was inserted in the esophagus and stomach without difficulty and multiple views were obtained.  The patient was kept under observation until the patient left the procedure room.  The patient left the procedure room in stable condition.   Agitated microbubble saline contrast was administered.  COMPLICATIONS:    There were no immediate complications.  FINDINGS:   FORMAL ECHOCARDIOGRAM REPORT PENDING  Normal appearing watchman device, well seated in LAA. No residual flow, no dehiscence. No adherent thrombus. No LA mural thrombus, no LV apical thrombus.  No residual atrial level shunt s/p watchman implant.   Likely fibrinous material on RA lead of pacemaker.  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 Probable A2 prolapse and degenerative valve change.   At least moderate TR.   Quantitation of valvular lesions pending on formal echo report.   RECOMMENDATIONS:     proceed to cardioversion  Procedure: Electrical Cardioversion Indications:  Atrial Fibrillation  Procedure Details:  Consent: Risks of procedure as well as the  alternatives and risks of each were explained to the (patient/caregiver).  Consent for procedure obtained.  Time Out: Verified patient identification, verified procedure, site/side was marked, verified correct patient position, special equipment/implants available, medications/allergies/relevent history reviewed, required imaging and test results available. PERFORMED.  Patient placed on cardiac monitor, pulse oximetry, supplemental oxygen as necessary.  Sedation given: propofol per anesthesia Pacer pads placed anterior and posterior chest.  Cardioverted 1 time(s).  Cardioversion with synchronized biphasic 120J shock.  Evaluation: Findings: Post procedure EKG shows: sinus bradycardia. Complications: None Patient did tolerate procedure well.  Time Spent Directly with the Patient:  60 minutes   Amanda [name] 05/16/2020, 11:10 AM

## 2020-05-16 NOTE — Anesthesia Preprocedure Evaluation (Addendum)
Anesthesia Evaluation  Patient identified by MRN, date of birth, ID band Patient awake    Reviewed: Allergy & Precautions, NPO status , Patient's Chart, lab work & pertinent test results  History of Anesthesia Complications Negative for: history of anesthetic complications  Airway Mallampati: IV  TM Distance: >3 FB Neck ROM: Full    Dental  (+) Teeth Intact, Dental Advisory Given,    Pulmonary neg shortness of breath, sleep apnea , pneumonia, resolved, neg COPD, former smoker,  Covid-19 Nucleic Acid Test Results Lab Results      Component                Value               Date                      SARSCOV2NAA              NEGATIVE            05/14/2020                [place]              NEGATIVE            04/21/2019              breath sounds clear to auscultation       Cardiovascular hypertension, Pt. on medications and Pt. on home beta blockers + CAD, + Past MI, + Cardiac Stents and +CHF  + dysrhythmias Atrial Fibrillation + pacemaker  Rhythm:Irregular  1. Left ventricular ejection fraction, by estimation, is 60 to 65%. The  left ventricle has normal function. The left ventricle has no regional  wall motion abnormalities. There is moderate left ventricular hypertrophy.  Left ventricular diastolic  parameters are consistent with Grade II diastolic dysfunction  (pseudonormalization).  2. Right ventricular systolic function is normal. The right ventricular  size is normal. There is normal pulmonary artery systolic pressure.  3. Left atrial size was moderately dilated.  4. The mitral valve is normal in structure. Mild mitral valve  regurgitation. No evidence of mitral stenosis.  5. The aortic valve is normal in structure. Aortic valve regurgitation is  not visualized. Mild to moderate aortic valve sclerosis/calcification is  present, without any evidence of aortic stenosis.  6. The inferior vena cava is normal in  size with greater than 50%  respiratory variability, suggesting right atrial pressure of 3 mmHg.    Neuro/Psych neg Seizures CVA, No Residual Symptoms negative psych ROS   GI/Hepatic GERD  ,  Endo/Other  diabetesHypothyroidism   Renal/GU Renal diseaseLab Results      Component                Value               Date                      CREATININE               1.00                05/16/2020           Lab Results      Component                Value               Date  K                        2.9 (L)             05/16/2020                Musculoskeletal negative musculoskeletal ROS (+)   Abdominal   Peds  Hematology  (+) Blood dyscrasia, anemia , Lab Results      Component                Value               Date                      WBC                      6.3                 04/24/2019                HGB                      10.5 (L)            05/16/2020                HCT                      31.0 (L)            05/16/2020                MCV                      99.7                04/24/2019                PLT                      217                 04/24/2019            plavix   Anesthesia Other Findings Med[employer]onic pacemaker  Reproductive/Obstetrics                          Anesthesia Physical Anesthesia Plan  ASA: III  Anesthesia Plan: MAC and General   Post-op Pain Management:    Induction: Intravenous  PONV Risk Score and Plan: 3 and Propofol infusion and Treatment may vary due to age or medical condition  Airway Management Planned: Nasal Cannula  Additional Equipment: None  Intra-op Plan:   Post-operative Plan:   Informed Consent: I have reviewed the patients History and Physical, chart, labs and discussed the procedure including the risks, benefits and alternatives for the proposed anesthesia with the patient or authorized representative who has indicated his/her understanding and acceptance.      Dental advisory given  Plan Discussed with: CRNA and Surgeon  Anesthesia Plan Comments:         Anesthesia Quick Evaluation

## 2020-05-16 NOTE — Progress Notes (Signed)
  Echocardiogram Echocardiogram Transesophageal has been performed.  Fidel Levy 05/16/2020, 11:20 AM

## 2020-05-16 NOTE — Transfer of Care (Signed)
Immediate Anesthesia Transfer of Care Note  Patient: Amanda [name]  Procedure(s) Performed: TRANSESOPHAGEAL ECHOCARDIOGRAM (TEE) (N/A ) CARDIOVERSION (N/A )  Patient Location: Endoscopy Unit  Anesthesia Type:General  Level of Consciousness: awake, alert  and oriented  Airway & Oxygen Therapy: Patient Spontanous Breathing and Patient connected to nasal cannula oxygen  Post-op Assessment: Report given to RN and Post -op Vital signs reviewed and stable  Post vital signs: Reviewed and stable  Last Vitals:  Vitals Value Taken Time  BP 123/54 05/16/20 1109  Temp    Pulse 59 05/16/20 1109  Resp 13 05/16/20 1109  SpO2 100 % 05/16/20 1109  Vitals shown include unvalidated device data.  Last Pain:  Vitals:   05/16/20 0812  TempSrc: Oral  PainSc: 0-No pain         Complications: No complications documented.

## 2020-05-16 NOTE — Discharge Instructions (Signed)
TEE  YOU HAD AN CARDIAC PROCEDURE TODAY: Refer to the procedure report and other information in the discharge instructions given to you for any specific questions about what was found during the examination. If this information does not answer your questions, please call [facility] office at [phone] to clarify.   DIET: Your first meal following the procedure should be a light meal and then it is ok to progress to your normal diet. A half-sandwich or bowl of soup is an example of a good first meal. Heavy or fried foods are harder to digest and may make you feel nauseous or bloated. Drink plenty of fluids but you should avoid alcoholic beverages for 24 hours. If you had a esophageal dilation, please see attached instructions for diet.   ACTIVITY: Your care partner should take you home directly after the procedure. You should plan to take it easy, moving slowly for the rest of the day. You can resume normal activity the day after the procedure however YOU SHOULD NOT DRIVE, use power tools, machinery or perform tasks that involve climbing or major physical exertion for 24 hours (because of the sedation medicines used during the test).   SYMPTOMS TO REPORT IMMEDIATELY: A cardiologist can be reached at any hour. Please call [phone] for any of the following symptoms:  Vomiting of blood or coffee ground material  New, significant abdominal pain  New, significant chest pain or pain under the shoulder blades  Painful or persistently difficult swallowing  New shortness of breath  Black, tarry-looking or red, bloody stools  FOLLOW UP:  Please also call with any specific questions about appointments or follow up tests.  Electrical Cardioversion Electrical cardioversion is the delivery of a jolt of electricity to restore a normal rhythm to the heart. A rhythm that is too fast or is not regular keeps the heart from pumping well. In this procedure, sticky patches or metal paddles are placed on  the chest to deliver electricity to the heart from a device. This procedure may be done in an emergency if:  There is low or no blood pressure as a result of the heart rhythm.  Normal rhythm must be restored as fast as possible to protect the brain and heart from further damage.  It may save a life. This may also be a scheduled procedure for irregular or fast heart rhythms that are not immediately life-threatening.  What can I expect after the procedure?  Your blood pressure, heart rate, breathing rate, and blood oxygen level will be monitored until you leave the hospital or clinic.  Your heart rhythm will be watched to make sure it does not change.  You may have some redness on the skin where the shocks were given. Over the counter cortizone cream may be helpful.  Follow these instructions at home:  Do not drive for 24 hours if you were given a sedative during your procedure.  Take over-the-counter and prescription medicines only as told by your health care provider.  Ask your health care provider how to check your pulse. Check it often.  Rest for 48 hours after the procedure or as told by your health care provider.  Avoid or limit your caffeine use as told by your health care provider.  Keep all follow-up visits as told by your health care provider. This is important. Contact a health care provider if:  You feel like your heart is beating too quickly or your pulse is not regular.  You have a serious muscle cramp  that does not go away. Get help right away if:  You have discomfort in your chest.  You are dizzy or you feel faint.  You have trouble breathing or you are short of breath.  Your speech is slurred.  You have trouble moving an arm or leg on one side of your body.  Your fingers or toes turn cold or blue. Summary  Electrical cardioversion is the delivery of a jolt of electricity to restore a normal rhythm to the heart.  This procedure may be done right away  in an emergency or may be a scheduled procedure if the condition is not an emergency.  Generally, this is a safe procedure.  After the procedure, check your pulse often as told by your health care provider. This information is not intended to replace advice given to you by your health care provider. Make sure you discuss any questions you have with your health care provider. Document Revised: 08/22/2018 Document Reviewed: 08/22/2018 Elsevier Patient Education  [place].

## 2020-05-16 NOTE — H&P (Signed)
Interval H&P:  80 yo female presents at request of Dr. Curt Bears for cardioversion. History of ischemic stroke, AF, CKD stage III, DM2, HTN, OSA on CPAP. S/p Watchman implant for recurrent GI bleeding. Back in atrial fibrillation, planned for TEE/DCCV.  No recent labs available.   CBC with anemia and normal platelets 3/16 at outside facility.  K 2.9 on presentation, repeat draw in endoscopy 4.5.   Constitutional: No acute distress Eyes: sclera non-icteric, normal conjunctiva and lids ENMT: normal dentition, moist mucous membranes Cardiovascular: irregualr rhythm, normal rate, no murmurs. S1 and S2 normal. Radial pulses normal bilaterally. No jugular venous distention.  Respiratory: clear to auscultation bilaterally GI : normal bowel sounds, soft and nontender. No distention.   MSK: extremities warm, well perfused. No edema.  NEURO: grossly nonfocal exam, moves all extremities. PSYCH: alert and oriented x 3, normal mood and affect.   A/P: Proceed to TEE/DCCV for atrial fibrillation.  After careful review of history and examination, the risks and benefits of transesophageal echocardiogram have been explained including risks of esophageal damage, perforation (1:10,000 risk), bleeding, pharyngeal hematoma as well as other potential complications associated with conscious sedation including aspiration, arrhythmia, respiratory failure and death. Alternatives to treatment were discussed, questions were answered. Patient is willing to proceed. Risks, benefits and alternatives of direct current cardioversion reviewed including potential for post-cardioversion rhythms, especially life-threatening arrhythmias (ventricular tachycardia and fibrillation, profound bradycardia). Major complications may include serious or fatal arrhythmias, myocardial damage, and acute pulmonary edema; minor complications include skin burns and transient hypotension. Benefits include restoration of sinus rhythm. Alternatives to  treatment were discussed, questions were answered. Patient is willing to proceed.    Elouise Munroe, MD

## 2020-05-17 ENCOUNTER — Encounter (HOSPITAL_COMMUNITY): Payer: Self-pay | Admitting: Internal Medicine

## 2020-05-17 NOTE — Anesthesia Postprocedure Evaluation (Signed)
Anesthesia Post Note  Patient: Amanda [name]  Procedure(s) Performed: TRANSESOPHAGEAL ECHOCARDIOGRAM (TEE) (N/A ) CARDIOVERSION (N/A ) BUBBLE STUDY     Patient location during evaluation: Endoscopy Anesthesia Type: General Level of consciousness: awake and alert Pain management: pain level controlled Vital Signs Assessment: post-procedure vital signs reviewed and stable Respiratory status: spontaneous breathing, nonlabored ventilation, respiratory function stable and patient connected to nasal cannula oxygen Cardiovascular status: blood pressure returned to baseline and stable Postop Assessment: no apparent nausea or vomiting Anesthetic complications: no   No complications documented.  Last Vitals:  Vitals:   05/16/20 1109 05/16/20 1120  BP: (!) 123/54 (!) 113/43  Pulse: (!) 53 (!) 50  Resp: 11 14  Temp: 36.5 C   SpO2: 99% 99%    Last Pain:  Vitals:   05/16/20 1120  TempSrc:   PainSc: 0-No pain                 Phillips Goulette

## 2020-05-18 LAB — ECHO TEE
AV Mean grad: 8 mmHg
AV Peak grad: 18.1 mmHg
Ao pk vel: 2.13 m/s
MV M vel: 5.04 m/s
MV Peak grad: 101.6 mmHg
Radius: 0.9 cm

## 2020-05-23 DIAGNOSIS — Z6832 Body mass index (BMI) 32.0-32.9, adult: Secondary | ICD-10-CM | POA: Diagnosis not present

## 2020-05-23 DIAGNOSIS — E669 Obesity, unspecified: Secondary | ICD-10-CM | POA: Diagnosis not present

## 2020-05-23 DIAGNOSIS — R7303 Prediabetes: Secondary | ICD-10-CM | POA: Diagnosis not present

## 2020-05-24 ENCOUNTER — Telehealth: Payer: Self-pay | Admitting: Cardiology

## 2020-05-24 DIAGNOSIS — I482 Chronic atrial fibrillation, unspecified: Secondary | ICD-10-CM | POA: Diagnosis not present

## 2020-05-24 DIAGNOSIS — D6869 Other thrombophilia: Secondary | ICD-10-CM | POA: Diagnosis not present

## 2020-05-24 DIAGNOSIS — I5022 Chronic systolic (congestive) heart failure: Secondary | ICD-10-CM | POA: Diagnosis not present

## 2020-05-24 DIAGNOSIS — I428 Other cardiomyopathies: Secondary | ICD-10-CM | POA: Diagnosis not present

## 2020-05-24 NOTE — Telephone Encounter (Signed)
STAT if HR is under 50 or over 120 (normal HR is 60-100 beats per minute)  1) What is your heart rate? 62  2) Do you have a log of your heart rate readings (document readings)? Yes Earlier today 120 to 60   3) Do you have any other symptoms? No other symptoms because pt is laying down right now  Pt had a Cardioversion on 05/16/2020

## 2020-05-24 NOTE — Telephone Encounter (Signed)
Spoke to the patient just now and she let me know that her heart rate has been fluctuating between 60-120 beats per minute this morning. Right now her heart rate is 78 bpm. She is checking her heart rate with her portable pulse ox. She has not taken any of her medications yet this morning. She states that she is seeing her PCP this afternoon and will follow up with them in regards to this as well but just wanted to make Dr. Agustin Cree aware.    Encouraged patient to call back with any questions or concerns.

## 2020-06-10 ENCOUNTER — Ambulatory Visit: Payer: Medicare Other | Admitting: Cardiology

## 2020-06-10 ENCOUNTER — Encounter: Payer: Self-pay | Admitting: Cardiology

## 2020-06-10 ENCOUNTER — Other Ambulatory Visit: Payer: Self-pay

## 2020-06-10 VITALS — BP 128/76 | HR 79 | Ht 63.0 in | Wt 182.0 lb

## 2020-06-10 DIAGNOSIS — I480 Paroxysmal atrial fibrillation: Secondary | ICD-10-CM | POA: Diagnosis not present

## 2020-06-10 DIAGNOSIS — I340 Nonrheumatic mitral (valve) insufficiency: Secondary | ICD-10-CM

## 2020-06-10 DIAGNOSIS — I442 Atrioventricular block, complete: Secondary | ICD-10-CM

## 2020-06-10 DIAGNOSIS — R0600 Dyspnea, unspecified: Secondary | ICD-10-CM | POA: Diagnosis not present

## 2020-06-10 DIAGNOSIS — R351 Nocturia: Secondary | ICD-10-CM | POA: Diagnosis not present

## 2020-06-10 DIAGNOSIS — Z87440 Personal history of urinary (tract) infections: Secondary | ICD-10-CM | POA: Diagnosis not present

## 2020-06-10 DIAGNOSIS — R0609 Other forms of dyspnea: Secondary | ICD-10-CM

## 2020-06-10 HISTORY — DX: Nonrheumatic mitral (valve) insufficiency: I34.0

## 2020-06-10 NOTE — Patient Instructions (Signed)
Medication Instructions:  Your physician recommends that you continue on your current medications as directed. Please refer to the Current Medication list given to you today.  *If you need a refill on your cardiac medications before your next appointment, please call your pharmacy*   Lab Work: Your physician recommends that you return for lab work today: bmp, pro bnp  If you have labs (blood work) drawn today and your tests are completely normal, you will receive your results only by: Marland Kitchen MyChart Message (if you have MyChart) OR . A paper copy in the mail If you have any lab test that is abnormal or we need to change your treatment, we will call you to review the results.   Testing/Procedures: none   Follow-Up: At [facility], you and your health needs are our priority.  As part of our continuing mission to provide you with exceptional heart care, we have created designated Provider Care Teams.  These Care Teams include your primary Cardiologist (physician) and Advanced Practice Providers (APPs -  Physician Assistants and Nurse Practitioners) who all work together to provide you with the care you need, when you need it.  We recommend signing up for the patient portal called "MyChart".  Sign up information is provided on this After Visit Summary.  MyChart is used to connect with patients for Virtual Visits (Telemedicine).  Patients are able to view lab/test results, encounter notes, upcoming appointments, etc.  Non-urgent messages can be sent to your provider as well.   To learn more about what you can do with MyChart, go to [email or web address].    Your next appointment:   1 month(s)  The format for your next appointment:   In Person  Provider:   Jenne Campus, MD   Other Instructions

## 2020-06-10 NOTE — Addendum Note (Signed)
Addended by: Senaida Ores on: 06/10/2020 03:04 PM   Modules accepted: Orders

## 2020-06-10 NOTE — Progress Notes (Signed)
Cardiology Office Note:    Date:  06/10/2020   ID:  Amanda [name], DOB Nov 23, 1940, MRN [id number]  PCP:  Street, Sharon Mt, MD  Cardiologist:  Jenne Campus, MD    Referring MD: Street, Sharon Mt, *   Chief Complaint  Patient presents with  . Atrial Fibrillation    History of Present Illness:    Amanda [name] is a 80 y.o. female with past medical history significant for persistent atrial fibrillation, history of CVA, chronic kidney failure, diabetes, essential hypertension, obstructive sleep apnea, watchman device present, she is not anticoagulated secondary to recurrent GI bleeding. Recently she ended up having transesophageal echocardiogram done and after that she was cardioverted to sinus rhythm she stayed in sinus rhythm for about 5 days simply back to atypical atrial flutter.  Overall she is doing well however what is concerning is the fact that TEE revealed severe mitral regurgitation.  Prior echocardiogram showed only mild mitral regurgitation.  She also got moderate to severe tricuspid regurgitation.  Obviously there was a very concerning findings. She described to have some fatigue and tiredness and shortness of breath while walking.  She also described reflux symptoms she has to get up in the middle of the night because of shortness of breath.  There is no swelling of lower extremities.  Past Medical History:  Diagnosis Date  . Acute ischemic stroke ([place])   . Acute metabolic encephalopathy   . Acute on chronic systolic (congestive) heart failure ([place])   . Acute renal insufficiency 09/02/2016  . Anemia due to stage 4 chronic kidney disease ([place]) 04/04/2015  . Atrial fibrillation ([place])   . Benign hypertension with CKD (chronic kidney disease) stage IV ([place]) 04/04/2015  . Bradycardia 09/17/2014  . Chest pain 06/23/2016   Overview:  Added automatically from request for surgery [id number]  . Chronic anemia   . Chronic kidney disease (CKD), stage III (moderate) (HCC)   .  Coronary artery disease 10/12/2016   Non drug-eluting stent implanted in June 2018 to mid RCA   08/08/2016 10:37  Angiographic Findings  Cardiac Arteries and Lesion Findings LMCA: 0% and Normal. LAD: 0% and Normal. RCA: Lesion on Mid RCA: Mid subsection.95% stenosis reduced to 0%. Pre procedure TIMI II flow was noted. Post Procedure TIMI III flow was present. Poor run off was present. The lesion was diagnosed as High Risk (C).   . 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 Diabetes mellitus without complication ([place])   . Diverticulosis   . Dyslipidemia 09/17/2014  . Gastroesophageal reflux   . GI bleed 08/06/2016  . Heart block AV complete ([place]) 09/05/2019  . History of cardiac monitoring 02/23/2018   monitor inserted  . Hyperlipidemia   . Hypertensive heart disease with heart failure ([place])   . Iron deficiency anemia 12/28/2017  . LV dysfunction 09/02/2016  . Myocardial infarction ([place])   . NSTEMI (non-ST elevated myocardial infarction) ([place]) 08/06/2016  . Obstructive sleep apnea 09/17/2014  . Orthostatic hypotension 12/28/2017  . OSA on CPAP   . 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 Postural dizziness with presyncope 09/23/2017  . 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 Renal failure, chronic, stage 3 (moderate) (HCC)   . Syncope 11/04/2017  . Systolic heart failure ([place])   . Thyroid disease   . Vitamin D deficiency 04/04/2015    Past Surgical History:  Procedure Laterality Date  . BUBBLE  STUDY  05/16/2020   Procedure: BUBBLE STUDY;  Surgeon: Elouise Munroe, MD;  Location: [facility];  Service: Cardiovascular;;  . CARDIAC CATHETERIZATION    . CARDIOVERSION N/A 05/16/2020   Procedure: CARDIOVERSION;  Surgeon: Elouise Munroe, MD;  Location: [facility] ENDOSCOPY;  Service: Cardiovascular;  Laterality: N/A;  . COLONOSCOPY  11/21/2014   Moderate predominantly sigmoid  diverticulosis. Otherwise noraml collonscopy to TI.   Marland Kitchen CORONARY ANGIOPLASTY WITH STENT PLACEMENT  08/2016  . EXCISIONAL HEMORRHOIDECTOMY    . LEFT ATRIAL APPENDAGE OCCLUSION  11/2016   in [place]  . LOOP RECORDER INSERTION N/A 02/23/2018   Procedure: LOOP RECORDER INSERTION;  Surgeon: Constance Haw, MD;  Location: [facility] CV LAB;  Service: Cardiovascular;  Laterality: N/A;  . LOOP RECORDER REMOVAL N/A 04/24/2019   Procedure: LOOP RECORDER REMOVAL;  Surgeon: Constance Haw, MD;  Location: [facility];  Service: Cardiovascular;  Laterality: N/A;  . NOSE SURGERY    . PACEMAKER IMPLANT N/A 04/24/2019   Procedure: PACEMAKER IMPLANT;  Surgeon: Constance Haw, MD;  Location: [facility];  Service: Cardiovascular;  Laterality: N/A;  . TEE WITHOUT CARDIOVERSION N/A 05/16/2020   Procedure: TRANSESOPHAGEAL ECHOCARDIOGRAM (TEE);  Surgeon: Elouise Munroe, MD;  Location: [facility] ENDOSCOPY;  Service: Cardiovascular;  Laterality: N/A;    Current Medications: Current Meds  Medication Sig  . albuterol (VENTOLIN HFA) 108 (90 Base) MCG/ACT inhaler Inhale 2 puffs into the lungs every 6 (six) hours as needed for wheezing or shortness of breath.  Marland Kitchen Bioflavonoid Products (BIOFLEX PO) Take 5 mg by mouth daily.  Marland Kitchen BIOTIN PO Take 600 mcg by mouth daily. Unknown strength  . Cholecalciferol (VITAMIN D3) 50 MCG (2000 UT) TABS Take 2,000 Units by mouth daily at 12 noon.  . clopidogrel (PLAVIX) 75 MG tablet Take 75 mg by mouth daily.  . Coenzyme Q10 100 MG TABS Take 100 mg by mouth daily at 12 noon.   . colchicine 0.6 MG tablet Take 0.6-1.2 mg by mouth See admin instructions. 1.'2mg'$  at onset of gout attack, then 0.'6mg'$  an hour later. Next day 0.'6mg'$  twice a day, then 0.'6mg'$  daily until gout pain subsides.  Marland Kitchen estradiol (ESTRACE) 0.1 MG/GM vaginal cream Place 1 Applicatorful vaginally every Monday, Wednesday, and Friday.   . ezetimibe (ZETIA) 10 MG tablet TAKE 1 TABLET BY MOUTH EVERY  EVENING. (Patient taking differently: Take 10 mg by mouth at bedtime.)  . famotidine (PEPCID) 40 MG tablet Take 40 mg by mouth daily.  . febuxostat (ULORIC) 40 MG tablet Take 40 mg by mouth at bedtime.  . ferrous sulfate 325 (65 FE) MG EC tablet Take 325 mg by mouth 2 (two) times daily.   . folic acid (FOLVITE) [id number] MCG tablet Take 800 mcg by mouth daily at 12 noon.  . furosemide (LASIX) 20 MG tablet Take 20-40 mg by mouth See admin instructions. Take 2 tablets (40 mg) by mouth daily in the morning & 1 tablet (20 mg) by mouth at noon  . Glucosamine-Chondroitin (OSTEO BI-FLEX REGULAR STRENGTH PO) Take 1 tablet by mouth daily at 12 noon.  . isosorbide mononitrate (IMDUR) 30 MG 24 hr tablet TAKE 1 TABLET BY MOUTH DAILY. (Patient taking differently: Take 30 mg by mouth daily.)  . levothyroxine (SYNTHROID, LEVOTHROID) 50 MCG tablet Take 50 mcg by mouth daily before breakfast.  . metoprolol succinate (TOPROL-XL) 25 MG 24 hr tablet Take 1 tablet (25 mg total) by mouth daily. Take with or immediately following a meal.  .  Multiple Vitamins-Minerals (PRESERVISION AREDS 2) CAPS Take 1 capsule by mouth 2 (two) times daily. Unknow strength  . nitroGLYCERIN (NITROSTAT) 0.4 MG SL tablet Place 0.4 mg under the tongue every 5 (five) minutes as needed for chest pain.  Marland Kitchen omega-3 acid ethyl esters (LOVAZA) 1 g capsule TAKE 2 CAPSULES BY MOUTH DAILY (Patient taking differently: Take 1 g by mouth daily.)  . pantoprazole (PROTONIX) 40 MG tablet TAKE 1 TABLET BY MOUTH DAILY (Patient taking differently: Take 40 mg by mouth daily.)  . polyethylene glycol powder (GLYCOLAX/MIRALAX) powder Take 17 g by mouth every other day. Alternating between miralax and benefiber  . pravastatin (PRAVACHOL) 40 MG tablet Take 40 mg by mouth at bedtime.  . Probiotic Product (PRO-BIOTIC BLEND PO) Take 1 capsule by mouth daily at 12 noon. [place] Unknown strength  . ranolazine (RANEXA) 1000 MG SR tablet TAKE 1 TABLET BY MOUTH 2 TIMES DAILY.  (Patient taking differently: Take 1,000 mg by mouth 2 (two) times daily.)  . REPATHA SURECLICK [id number] MG/ML SOAJ INJECT 140 MG INTO THE SKIN EVERY 14 DAYS. (Patient taking differently: Inject 140 mg into the skin every 14 (fourteen) days.)  . traMADol (ULTRAM) 50 MG tablet Take 50 mg by mouth every 12 (twelve) hours as needed for moderate pain.   . vitamin B-12 (CYANOCOBALAMIN) 1000 MCG tablet Take 1,000 mcg by mouth daily at 12 noon.  . Wheat Dextrin (BENEFIBER PO) Take 1 Dose by mouth every other day. Alternating between benefiber & miralax  . zolpidem (AMBIEN) 10 MG tablet Take 5 mg by mouth at bedtime.   Current Facility-Administered Medications for the 06/10/20 encounter (Office Visit) with Park Liter, MD  Medication  . 0.9 %  sodium chloride infusion     Allergies:   Ciprofloxacin, Contrast media [iodinated diagnostic agents], Nsaids, and Statins   Social History   Socioeconomic History  . Marital status: Married    Spouse name: Not on file  . Number of children: 2  . Years of education: Not on file  . Highest education level: Not on file  Occupational History  . Occupation: Retired   Tobacco Use  . Smoking status: Former Research scientist (life sciences)  . Smokeless tobacco: Never Used  Vaping Use  . Vaping Use: Never used  Substance and Sexual Activity  . Alcohol use: No  . Drug use: No  . Sexual activity: Not on file  Other Topics Concern  . Not on file  Social History Narrative  . Not on file   Social Determinants of Health   Financial Resource Strain: Not on file  Food Insecurity: Not on file  Transportation Needs: Not on file  Physical Activity: Not on file  Stress: Not on file  Social Connections: Not on file     Family History: The patient's family history includes Breast cancer in her mother; Cancer in her maternal grandfather; Diabetes in her sister; Heart attack in her brother, maternal uncle, and sister; Hypertension in her father; Prostate cancer in her father; Stroke in  her father. There is no history of Esophageal cancer. ROS:   Please see the history of present illness.    All 14 point review of systems negative except as described per history of present illness  EKGs/Labs/Other Studies Reviewed:      Recent Labs: 05/16/2020: BUN 44; Creatinine, Ser 1.60; Hemoglobin 10.5; Potassium 4.5; Sodium 142  Recent Lipid Panel    Component Value Date/Time   CHOL 125 09/05/2019 0842   TRIG 162 (H) 09/05/2019 [id number]  HDL 59 09/05/2019 0842   CHOLHDL 2.1 09/05/2019 0842   LDLCALC 39 09/05/2019 0842    Physical Exam:    VS:  BP 128/76 (BP Location: Left Arm, Patient Position: Sitting)   Pulse 79   Ht '5\' 3"'$  (1.6 m)   Wt 182 lb (82.6 kg)   SpO2 98%   BMI 32.24 kg/m     Wt Readings from Last 3 Encounters:  06/10/20 182 lb (82.6 kg)  05/16/20 180 lb (81.6 kg)  04/01/20 184 lb (83.5 kg)     GEN:  Well nourished, well developed in no acute distress HEENT: Normal NECK: No JVD; No carotid bruits LYMPHATICS: No lymphadenopathy CARDIAC: Irregular, tones are very distant I cannot hear systolic murmur., no rubs, no gallops RESPIRATORY:  Clear to auscultation without rales, wheezing or rhonchi  ABDOMEN: Soft, non-tender, non-distended MUSCULOSKELETAL:  No edema; No deformity  SKIN: Warm and dry LOWER EXTREMITIES: no swelling NEUROLOGIC:  Alert and oriented x 3 PSYCHIATRIC:  Normal affect   ASSESSMENT:    No diagnosis found. PLAN:    In order of problems listed above:  1. Paroxysmal atrial fibrillation.  She is back to atypical atrial flutter.  She did have cardioversion just few weeks ago.  She is not anticoagulated secondary to frequent GI bleeding, she does have a watchman device.  She is scheduled to see EP team I think we need to reconsider antiarrhythmic therapy for her and then try to convert him to sinus rhythm with antiarrhythmic on board. 2. Mitral regurgitation which is new and appears to be severe problem.  She does have some symptoms of it  however I cannot hear it on my physical examination.  She is already on diuretic, I will check her proBNP as well as Chem-7 today and we will try to maximize diuresis also will try to reduce blood pressure and hopefully by doing this will be able to reduce the amount of mitral regurgitation.  Also converting him to sinus rhythm can be beneficial.  We talked about conservative approach to this meaning medical therapy or at 1 trying to do right now, we did talk also about potential mitral valve clip as well as open heart surgery to fix it.  Hopefully I will be able to control it and improve it with medical therapy first. 3. Pacemaker present, remain longevity 14 years, patient has been in A. fib since October 2021. 4. Dyslipidemia, stable on appropriate medications pravastatin which I will continue. 5. Chronic kidney failure, will check her Chem-7. I did review TEE for this visit  Medication Adjustments/Labs and Tests Ordered: Current medicines are reviewed at length with the patient today.  Concerns regarding medicines are outlined above.  No orders of the defined types were placed in this encounter.  Medication changes: No orders of the defined types were placed in this encounter.   Signed, Park Liter, MD, [facility] 06/10/2020 2:55 PM    [place]

## 2020-06-11 LAB — BASIC METABOLIC PANEL
BUN/Creatinine Ratio: 30 — ABNORMAL HIGH (ref 12–28)
BUN: 49 mg/dL — ABNORMAL HIGH (ref 8–27)
CO2: 24 mmol/L (ref 20–29)
Calcium: 10.5 mg/dL — ABNORMAL HIGH (ref 8.7–10.3)
Chloride: 102 mmol/L (ref 96–106)
Creatinine, Ser: 1.64 mg/dL — ABNORMAL HIGH (ref 0.57–1.00)
Glucose: 141 mg/dL — ABNORMAL HIGH (ref 65–99)
Potassium: 4.5 mmol/L (ref 3.5–5.2)
Sodium: 142 mmol/L (ref 134–144)
eGFR: 32 mL/min/{1.73_m2} — ABNORMAL LOW (ref >59)

## 2020-06-11 LAB — PRO B NATRIURETIC PEPTIDE: NT-Pro BNP: 2349 pg/mL — ABNORMAL HIGH (ref 0–738)

## 2020-06-12 ENCOUNTER — Telehealth: Payer: Self-pay | Admitting: Emergency Medicine

## 2020-06-12 DIAGNOSIS — Z79899 Other long term (current) drug therapy: Secondary | ICD-10-CM

## 2020-06-12 NOTE — Telephone Encounter (Signed)
Called patient informed of results. She reports she is taking lasix 40 mg in the morning and 20 mg in the evening daily. Advised her to go up to 40 mg in the morning and 40 mg in the evening daily and repeat labs on Monday. She understood. No further questions.

## 2020-06-12 NOTE — Telephone Encounter (Signed)
-----   Message from Park Liter, MD sent at 06/11/2020  6:59 PM EDT ----- What she told me last visit which was yesterday still felt that she takes 80 mg of Lasix in the morning and 40 in the afternoon at increased to 80 mg in the morning and in the 80 mg in the afternoon, she needs to have Chem-7 repeated on Monday

## 2020-06-15 ENCOUNTER — Other Ambulatory Visit: Payer: Self-pay | Admitting: Cardiology

## 2020-06-17 LAB — BASIC METABOLIC PANEL
BUN/Creatinine Ratio: 28 (ref 12–28)
BUN: 50 mg/dL — ABNORMAL HIGH (ref 8–27)
CO2: 24 mmol/L (ref 20–29)
Calcium: 10.1 mg/dL (ref 8.7–10.3)
Chloride: 100 mmol/L (ref 96–106)
Creatinine, Ser: 1.78 mg/dL — ABNORMAL HIGH (ref 0.57–1.00)
Glucose: 152 mg/dL — ABNORMAL HIGH (ref 65–99)
Potassium: 4.5 mmol/L (ref 3.5–5.2)
Sodium: 139 mmol/L (ref 134–144)
eGFR: 29 mL/min/{1.73_m2} — ABNORMAL LOW (ref >59)

## 2020-06-19 ENCOUNTER — Telehealth: Payer: Self-pay

## 2020-06-19 DIAGNOSIS — E785 Hyperlipidemia, unspecified: Secondary | ICD-10-CM

## 2020-06-19 DIAGNOSIS — Z79899 Other long term (current) drug therapy: Secondary | ICD-10-CM

## 2020-06-19 NOTE — Telephone Encounter (Signed)
-----   Message from Park Liter, MD sent at 06/19/2020  8:31 AM EDT ----- Laboratory test show some elevation of creatinine still within acceptable limits.  I recommend to repeat Chem-7 in about 2 weeks

## 2020-06-19 NOTE — Telephone Encounter (Signed)
Patient notified of test results and recommendations. Will stop by the week of 07/08/2020 for blood work.

## 2020-07-08 ENCOUNTER — Telehealth: Payer: Self-pay

## 2020-07-08 DIAGNOSIS — E785 Hyperlipidemia, unspecified: Secondary | ICD-10-CM | POA: Diagnosis not present

## 2020-07-08 DIAGNOSIS — G720 Drug-induced myopathy: Secondary | ICD-10-CM | POA: Diagnosis not present

## 2020-07-08 DIAGNOSIS — E1122 Type 2 diabetes mellitus with diabetic chronic kidney disease: Secondary | ICD-10-CM | POA: Diagnosis not present

## 2020-07-08 DIAGNOSIS — N184 Chronic kidney disease, stage 4 (severe): Secondary | ICD-10-CM | POA: Diagnosis not present

## 2020-07-08 DIAGNOSIS — M1A09X0 Idiopathic chronic gout, multiple sites, without tophus (tophi): Secondary | ICD-10-CM | POA: Diagnosis not present

## 2020-07-08 NOTE — Telephone Encounter (Signed)
Per patient will be obtaining all labs including a CMET at [facility].

## 2020-07-15 ENCOUNTER — Other Ambulatory Visit: Payer: Self-pay | Admitting: Cardiology

## 2020-07-15 NOTE — Telephone Encounter (Signed)
Zetia 10 mg # 90 x 2 refills sent to pharmacy

## 2020-07-17 ENCOUNTER — Ambulatory Visit: Payer: Medicare Other | Admitting: Cardiology

## 2020-07-17 ENCOUNTER — Encounter: Payer: Self-pay | Admitting: Cardiology

## 2020-07-17 ENCOUNTER — Other Ambulatory Visit: Payer: Self-pay

## 2020-07-17 VITALS — BP 132/70 | HR 74 | Ht 63.0 in | Wt 184.0 lb

## 2020-07-17 DIAGNOSIS — I340 Nonrheumatic mitral (valve) insufficiency: Secondary | ICD-10-CM

## 2020-07-17 DIAGNOSIS — G4733 Obstructive sleep apnea (adult) (pediatric): Secondary | ICD-10-CM

## 2020-07-17 DIAGNOSIS — Z95818 Presence of other cardiac implants and grafts: Secondary | ICD-10-CM | POA: Diagnosis not present

## 2020-07-17 DIAGNOSIS — I480 Paroxysmal atrial fibrillation: Secondary | ICD-10-CM | POA: Diagnosis not present

## 2020-07-17 DIAGNOSIS — I442 Atrioventricular block, complete: Secondary | ICD-10-CM

## 2020-07-17 DIAGNOSIS — Z9989 Dependence on other enabling machines and devices: Secondary | ICD-10-CM

## 2020-07-17 NOTE — Patient Instructions (Signed)

## 2020-07-17 NOTE — Progress Notes (Signed)
Cardiology Office Note:    Date:  07/17/2020   ID:  Amanda [name], DOB 06/05/1940, MRN [id number]  PCP:  Street, Sharon Mt, MD  Cardiologist:  Jenne Campus, MD    Referring MD: Street, Sharon Mt, *   Chief Complaint  Patient presents with   Follow-up  I am doing well  History of Present Illness:    Amanda [name] is a 80 y.o. female with past medical history significant for paroxysmal atrial fibrillation, coronary artery disease, status post PTCA and stenting to mid RCA in 2018, history of CVA, she is not anticoagulated because of frequent GI bleed however she does have watchman device, chronic kidney failure with creatinine of 1.7, diabetes, essential hypertension, obstructive sleep apnea.  In April she did have TEE cardioversion to sinus rhythm she stayed in sinus rhythm just for few days and then she flipped back to atrial fibrillation actually when I saw her last time she was in atrial flutter.  Also when she had TEE done she was identified to have significant severe tricuspid and mitral regurgitation.  Interestingly overall clinically she seems to be doing well, her exercise capacity is limited secondary to chronic hip problem.  She denies have any shortness of breath there is no swelling of lower extremities there is no proximal nocturnal dyspnea interestingly she feels palpitations only when she lays down at night  Past Medical History:  Diagnosis Date   Acute ischemic stroke ([place])    Acute metabolic encephalopathy    Acute on chronic systolic (congestive) heart failure (HCC)    Acute renal insufficiency 09/02/2016   Anemia due to stage 4 chronic kidney disease ([place]) 04/04/2015   Atrial fibrillation (HCC)    Benign hypertension with CKD (chronic kidney disease) stage IV ([place]) 04/04/2015   Bradycardia 09/17/2014   Chest pain 06/23/2016   Overview:  Added automatically from request for surgery [id number]   Chronic anemia    Chronic kidney disease (CKD), stage III (moderate)  ([place])    Coronary artery disease 10/12/2016   Non drug-eluting stent implanted in June 2018 to mid RCA   08/08/2016 10:37  Angiographic Findings  Cardiac Arteries and Lesion Findings LMCA: 0% and Normal. LAD: 0% and Normal. RCA:   Lesion on Mid RCA: Mid subsection.95% stenosis reduced to 0%. Pre procedure   TIMI II flow was noted. Post Procedure TIMI III flow was present. Poor run   off was present. The lesion was diagnosed as High Risk (C).    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 ([place]) 04/04/2015   Diabetes mellitus without complication ([place])    Diverticulosis    Dyslipidemia 09/17/2014   Gastroesophageal reflux    GI bleed 08/06/2016   Heart block AV complete ([place]) 09/05/2019   History of cardiac monitoring 02/23/2018   monitor inserted   Hyperlipidemia    Hypertensive heart disease with heart failure (HCC)    Iron deficiency anemia 12/28/2017   LV dysfunction 09/02/2016   Myocardial infarction [facility])    NSTEMI (non-ST elevated myocardial infarction) ([place]) 08/06/2016   Obstructive sleep apnea 09/17/2014   Orthostatic hypotension 12/28/2017   OSA on CPAP    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   Postural dizziness with presyncope 09/23/2017   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/24/2017   Recurrent left pleural effusion 03/14/2015   Renal failure, chronic, stage 3 (moderate) (HCC)    Syncope [id number]   Systolic heart failure ([place])    Thyroid disease  Vitamin D deficiency 04/04/2015    Past Surgical History:  Procedure Laterality Date   BUBBLE STUDY  05/16/2020   Procedure: BUBBLE STUDY;  Surgeon: Elouise Munroe, MD;  Location: [place];  Service: Cardiovascular;;   CARDIAC CATHETERIZATION     CARDIOVERSION N/A 05/16/2020   Procedure: CARDIOVERSION;  Surgeon: Elouise Munroe, MD;  Location: [facility] ENDOSCOPY;  Service: Cardiovascular;  Laterality: N/A;   COLONOSCOPY  11/21/2014   Moderate predominantly sigmoid diverticulosis. Otherwise  noraml collonscopy to TI.    CORONARY ANGIOPLASTY WITH STENT PLACEMENT  08/2016   EXCISIONAL HEMORRHOIDECTOMY     LEFT ATRIAL APPENDAGE OCCLUSION  11/2016   in [place] N/A 02/23/2018   Procedure: [place];  Surgeon: Constance Haw, MD;  Location: [place] CV LAB;  Service: Cardiovascular;  Laterality: N/A;   LOOP RECORDER REMOVAL N/A 04/24/2019   Procedure: LOOP RECORDER REMOVAL;  Surgeon: Constance Haw, MD;  Location: [place] CV LAB;  Service: Cardiovascular;  Laterality: N/A;   NOSE SURGERY     PACEMAKER IMPLANT N/A 04/24/2019   Procedure: PACEMAKER IMPLANT;  Surgeon: Constance Haw, MD;  Location: [place] CV LAB;  Service: Cardiovascular;  Laterality: N/A;   TEE WITHOUT CARDIOVERSION N/A 05/16/2020   Procedure: TRANSESOPHAGEAL ECHOCARDIOGRAM (TEE);  Surgeon: Elouise Munroe, MD;  Location: [facility] ENDOSCOPY;  Service: Cardiovascular;  Laterality: N/A;    Current Medications: Current Meds  Medication Sig   albuterol (VENTOLIN HFA) 108 (90 Base) MCG/ACT inhaler Inhale 2 puffs into the lungs every 6 (six) hours as needed for wheezing or shortness of breath.   Bioflavonoid Products (BIOFLEX PO) Take 5 mg by mouth daily.   BIOTIN PO Take 600 mcg by mouth 2 (two) times daily.   Cholecalciferol (VITAMIN D3) 50 MCG (2000 UT) TABS Take 2,000 Units by mouth daily at 12 noon.   clopidogrel (PLAVIX) 75 MG tablet Take 75 mg by mouth daily.   Coenzyme Q10 100 MG TABS Take 100 mg by mouth daily at 12 noon.    colchicine 0.6 MG tablet Take 0.6-1.2 mg by mouth See admin instructions. 1.'2mg'$  at onset of gout attack, then 0.'6mg'$  an hour later. Next day 0.'6mg'$  twice a day, then 0.'6mg'$  daily until gout pain subsides.   estradiol (ESTRACE) 0.1 MG/GM vaginal cream Place 1 Applicatorful vaginally every Monday, Wednesday, and Friday.    Evolocumab (REPATHA) 140 MG/ML SOSY Inject 140 mg into the skin every 14 (fourteen) days.   ezetimibe (ZETIA)  10 MG tablet TAKE 1 TABLET BY MOUTH EVERY EVENING.   famotidine (PEPCID) 40 MG tablet Take 40 mg by mouth daily.   febuxostat (ULORIC) 40 MG tablet Take 40 mg by mouth at bedtime.   ferrous sulfate 325 (65 FE) MG EC tablet Take 325 mg by mouth 2 (two) times daily.    folic acid (FOLVITE) Q000111Q MCG tablet Take 800 mcg by mouth daily at 12 noon.   furosemide (LASIX) 20 MG tablet Take 40 mg by mouth 2 (two) times daily.   Glucosamine-Chondroitin (OSTEO BI-FLEX REGULAR STRENGTH PO) Take 1 tablet by mouth daily at 12 noon.   isosorbide mononitrate (IMDUR) 30 MG 24 hr tablet TAKE 1 TABLET BY MOUTH DAILY.   levothyroxine (SYNTHROID, LEVOTHROID) 50 MCG tablet Take 50 mcg by mouth daily before breakfast.   metoprolol succinate (TOPROL-XL) 25 MG 24 hr tablet Take 1 tablet (25 mg total) by mouth daily. Take with or immediately following a meal.   Multiple Vitamins-Minerals (  PRESERVISION AREDS 2) CAPS Take 1 capsule by mouth 2 (two) times daily. Unknow strength   nitroGLYCERIN (NITROSTAT) 0.4 MG SL tablet Place 0.4 mg under the tongue every 5 (five) minutes as needed for chest pain.   omega-3 acid ethyl esters (LOVAZA) 1 g capsule Take 2 g by mouth daily.   pantoprazole (PROTONIX) 40 MG tablet TAKE 1 TABLET BY MOUTH DAILY   polyethylene glycol powder (GLYCOLAX/MIRALAX) powder Take 17 g by mouth every other day. Alternating between miralax and benefiber   pravastatin (PRAVACHOL) 40 MG tablet Take 40 mg by mouth at bedtime.   Probiotic Product (PRO-BIOTIC BLEND PO) Take 1 capsule by mouth daily at 12 noon. [place] Unknown strength   ranolazine (RANEXA) 1000 MG SR tablet TAKE 1 TABLET BY MOUTH 2 TIMES DAILY.   vitamin B-12 (CYANOCOBALAMIN) 1000 MCG tablet Take 1,000 mcg by mouth daily at 12 noon.   Wheat Dextrin (BENEFIBER PO) Take 1 Dose by mouth every other day. Alternating between benefiber & miralax   zolpidem (AMBIEN) 5 MG tablet Take 5 mg by mouth at bedtime as needed for sleep.   Current  Facility-Administered Medications for the 07/17/20 encounter (Office Visit) with Park Liter, MD  Medication   0.9 %  sodium chloride infusion     Allergies:   Ciprofloxacin, Contrast media [iodinated diagnostic agents], Nsaids, and Statins   Social History   Socioeconomic History   Marital status: Married    Spouse name: Not on file   Number of children: 2   Years of education: Not on file   Highest education level: Not on file  Occupational History   Occupation: Retired   Tobacco Use   Smoking status: Former    Pack years: 0.00   Smokeless tobacco: Never  Vaping Use   Vaping Use: Never used  Substance and Sexual Activity   Alcohol use: No   Drug use: No   Sexual activity: Not on file  Other Topics Concern   Not on file  Social History Narrative   Not on file   Social Determinants of Health   Financial Resource Strain: Not on file  Food Insecurity: Not on file  Transportation Needs: Not on file  Physical Activity: Not on file  Stress: Not on file  Social Connections: Not on file     Family History: The patient's family history includes Breast cancer in her mother; Cancer in her maternal grandfather; Diabetes in her sister; Heart attack in her brother, maternal uncle, and sister; Hypertension in her father; Prostate cancer in her father; Stroke in her father. There is no history of Esophageal cancer. ROS:   Please see the history of present illness.    All 14 point review of systems negative except as described per history of present illness  EKGs/Labs/Other Studies Reviewed:      Recent Labs: 05/16/2020: Hemoglobin 10.5 06/10/2020: NT-Pro BNP 2,349 06/17/2020: BUN 50; Creatinine, Ser 1.78; Potassium 4.5; Sodium 139  Recent Lipid Panel    Component Value Date/Time   CHOL 125 09/05/2019 0842   TRIG 162 (H) 09/05/2019 0842   HDL 59 09/05/2019 0842   CHOLHDL 2.1 09/05/2019 0842   LDLCALC 39 09/05/2019 0842    Physical Exam:    VS:  BP 132/70 (BP  Location: Right Arm, Patient Position: Sitting, Cuff Size: Normal)   Pulse 74   Ht '5\' 3"'$  (1.6 m)   Wt 184 lb (83.5 kg)   SpO2 98%   BMI 32.59 kg/m  Wt Readings from Last 3 Encounters:  07/17/20 184 lb (83.5 kg)  06/10/20 182 lb (82.6 kg)  05/16/20 180 lb (81.6 kg)     GEN:  Well nourished, well developed in no acute distress HEENT: Normal NECK: No JVD; No carotid bruits LYMPHATICS: No lymphadenopathy CARDIAC: Regular with some skipped beats, cannot hear much murmur s, no rubs, no gallops RESPIRATORY:  Clear to auscultation without rales, wheezing or rhonchi  ABDOMEN: Soft, non-tender, non-distended MUSCULOSKELETAL:  No edema; No deformity  SKIN: Warm and dry LOWER EXTREMITIES: no swelling NEUROLOGIC:  Alert and oriented x 3 PSYCHIATRIC:  Normal affect   ASSESSMENT:    1. Nonrheumatic mitral valve regurgitation   2. Paroxysmal atrial fibrillation (HCC)   3. OSA on CPAP   4. Presence of Watchman left atrial appendage closure device   5. Heart block AV complete (HCC)    PLAN:    In order of problems listed above:  Nonrheumatic mitral valve regurgitation which appears to be severe.  This is a very complicated situation.  Her blood pressure seems to be well controlled.  She is on diuretic with her creatinine 1.7 right now hemodynamically she appears to be compensated.  I think the first approach to this complicated situation will be try to control her rhythm.  In my opinion she need to be put on antiarrhythmic and then potentially converted to sinus rhythm.  Determine if in spite of conversion her mitral gravitation still remaining quite severe then we need to consider mitral valve clip. Paroxysmal atrial fibrillation.  We will do EKG today last time I seen her she was in flutter.  Today she seems to be irregular on the rhythm with some extrasystole I suspect she is in atrial flutter with different AV conduction.  She is scheduled to see EP team in 5 days we will anxiously  waiting for the decision regarding antiarrhythmic therapy. Obstructive sleep apnea to be followed by antimedicine team. Watchman device: Present noted Heart block.  She does have a pacemaker and actually 5 days she is going to see EP team to have her pacemaker checked.   Medication Adjustments/Labs and Tests Ordered: Current medicines are reviewed at length with the patient today.  Concerns regarding medicines are outlined above.  No orders of the defined types were placed in this encounter.  Medication changes: No orders of the defined types were placed in this encounter.   Signed, Park Liter, MD, [facility] 07/17/2020 3:01 PM    [place]

## 2020-07-17 NOTE — Addendum Note (Signed)
Addended by: Senaida Ores on: 07/17/2020 03:07 PM   Modules accepted: Orders

## 2020-07-22 ENCOUNTER — Encounter: Payer: Self-pay | Admitting: Cardiology

## 2020-07-22 ENCOUNTER — Ambulatory Visit (INDEPENDENT_AMBULATORY_CARE_PROVIDER_SITE_OTHER): Payer: Medicare Other | Admitting: Cardiology

## 2020-07-22 ENCOUNTER — Ambulatory Visit (INDEPENDENT_AMBULATORY_CARE_PROVIDER_SITE_OTHER): Payer: Medicare Other

## 2020-07-22 ENCOUNTER — Other Ambulatory Visit: Payer: Self-pay

## 2020-07-22 DIAGNOSIS — I480 Paroxysmal atrial fibrillation: Secondary | ICD-10-CM | POA: Diagnosis not present

## 2020-07-22 DIAGNOSIS — I442 Atrioventricular block, complete: Secondary | ICD-10-CM | POA: Diagnosis not present

## 2020-07-22 MED ORDER — METOPROLOL SUCCINATE ER 50 MG PO TB24: 50.0000 mg | ORAL_TABLET | Freq: Every day | ORAL | 1 refills | Status: DC

## 2020-07-22 NOTE — Progress Notes (Signed)
Electrophysiology Office Note   Date:  07/22/2020   ID:  Amanda [name], DOB 02-18-1940, MRN [id number]  PCP:  Street, Sharon Mt, MD  Cardiologist: Agustin Cree Primary Electrophysiologist:  Ariel Wingrove Meredith Leeds, MD    No chief complaint on file.    History of Present Illness: Amanda [name] is a 80 y.o. female who is being seen today for the evaluation of atrial fibrillation, syncope at the request of Jen[facility]. Presenting today for electrophysiology evaluation.    She has a history significant for ischemic stroke, atrial fibrillation, CKD stage III, diabetes, hypertension, OSA on CPAP.  She has a watchman device implanted due to recurrent GI bleeding.  She also has had an episode of syncope.  She wore a cardiac monitor that showed prolonged pauses.  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  Today, denies symptoms of palpitations, chest pain, shortness of breath, orthopnea, PND, lower extremity edema, claudication, dizziness, presyncope, syncope, bleeding, or neurologic sequela. The patient is tolerating medications without difficulties.  Unfortunately she has gone back into atrial fibrillation.  She has weakness and fatigue.  She also notes intermittent palpitations.  These occur in the middle of the night as her metoprolol is wearing off.  She went to get back into normal rhythm as she feels quite poorly.  Past Medical History:  Diagnosis Date   Acute ischemic stroke ([place])    Acute metabolic encephalopathy    Acute on chronic systolic (congestive) heart failure (HCC)    Acute renal insufficiency 09/02/2016   Anemia due to stage 4 chronic kidney disease ([place]) 04/04/2015   Atrial fibrillation (HCC)    Benign hypertension with CKD (chronic kidney disease) stage IV ([place]) 04/04/2015   Bradycardia 09/17/2014   Chest pain 06/23/2016   Overview:  Added automatically from request for surgery [id number]   Chronic anemia    Chronic kidney disease (CKD), stage III  (moderate) ([place])    Coronary artery disease 10/12/2016   Non drug-eluting stent implanted in June 2018 to mid RCA   08/08/2016 10:37  Angiographic Findings  Cardiac Arteries and Lesion Findings LMCA: 0% and Normal. LAD: 0% and Normal. RCA:   Lesion on Mid RCA: Mid subsection.95% stenosis reduced to 0%. Pre procedure   TIMI II flow was noted. Post Procedure TIMI III flow was present. Poor run   off was present. The lesion was diagnosed as High Risk (C).    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 ([place]) 04/04/2015   Diabetes mellitus without complication ([place])    Diverticulosis    Dyslipidemia 09/17/2014   Gastroesophageal reflux    GI bleed 08/06/2016   Heart block AV complete ([place]) 09/05/2019   History of cardiac monitoring 02/23/2018   monitor inserted   Hyperlipidemia    Hypertensive heart disease with heart failure (HCC)    Iron deficiency anemia 12/28/2017   LV dysfunction 09/02/2016   Myocardial infarction [facility])    NSTEMI (non-ST elevated myocardial infarction) ([place]) 08/06/2016   Obstructive sleep apnea 09/17/2014   Orthostatic hypotension 12/28/2017   OSA on CPAP    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   Postural dizziness with presyncope 09/23/2017   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/24/2017   Recurrent left pleural effusion 03/14/2015   Renal failure, chronic, stage 3 (moderate) ([place])    Syncope [id number]   Systolic heart failure ([place])    Thyroid disease    Vitamin D deficiency 04/04/2015   Past Surgical History:  Procedure  Laterality Date   BUBBLE STUDY  05/16/2020   Procedure: BUBBLE STUDY;  Surgeon: Elouise Munroe, MD;  Location: [facility];  Service: Cardiovascular;;   CARDIAC CATHETERIZATION     CARDIOVERSION N/A 05/16/2020   Procedure: CARDIOVERSION;  Surgeon: Elouise Munroe, MD;  Location: [facility] ENDOSCOPY;  Service: Cardiovascular;  Laterality: N/A;   COLONOSCOPY  11/21/2014   Moderate predominantly sigmoid diverticulosis.  Otherwise noraml collonscopy to TI.    CORONARY ANGIOPLASTY WITH STENT PLACEMENT  08/2016   EXCISIONAL HEMORRHOIDECTOMY     LEFT ATRIAL APPENDAGE OCCLUSION  11/2016   in [place] N/A 02/23/2018   Procedure: [place];  Surgeon: Constance Haw, MD;  Location: [facility] CV LAB;  Service: Cardiovascular;  Laterality: N/A;   LOOP RECORDER REMOVAL N/A 04/24/2019   Procedure: LOOP RECORDER REMOVAL;  Surgeon: Constance Haw, MD;  Location: [facility] CV LAB;  Service: Cardiovascular;  Laterality: N/A;   NOSE SURGERY     PACEMAKER IMPLANT N/A 04/24/2019   Procedure: PACEMAKER IMPLANT;  Surgeon: Constance Haw, MD;  Location: [facility] CV LAB;  Service: Cardiovascular;  Laterality: N/A;   TEE WITHOUT CARDIOVERSION N/A 05/16/2020   Procedure: TRANSESOPHAGEAL ECHOCARDIOGRAM (TEE);  Surgeon: Elouise Munroe, MD;  Location: [facility] ENDOSCOPY;  Service: Cardiovascular;  Laterality: N/A;     Current Outpatient Medications  Medication Sig Dispense Refill   albuterol (VENTOLIN HFA) 108 (90 Base) MCG/ACT inhaler Inhale 2 puffs into the lungs every 6 (six) hours as needed for wheezing or shortness of breath.     Bioflavonoid Products (BIOFLEX PO) Take 5 mg by mouth daily.     BIOTIN PO Take 600 mcg by mouth 2 (two) times daily.     Cholecalciferol (VITAMIN D3) 50 MCG (2000 UT) TABS Take 2,000 Units by mouth daily at 12 noon.     clopidogrel (PLAVIX) 75 MG tablet Take 75 mg by mouth daily.     Coenzyme Q10 100 MG TABS Take 100 mg by mouth daily at 12 noon.      colchicine 0.6 MG tablet Take 0.6-1.2 mg by mouth See admin instructions. 1.'2mg'$  at onset of gout attack, then 0.'6mg'$  an hour later. Next day 0.'6mg'$  twice a day, then 0.'6mg'$  daily until gout pain subsides.     estradiol (ESTRACE) 0.1 MG/GM vaginal cream Place 1 Applicatorful vaginally every Monday, Wednesday, and Friday.      Evolocumab (REPATHA) 140 MG/ML SOSY Inject 140 mg into the skin every 14  (fourteen) days.     ezetimibe (ZETIA) 10 MG tablet TAKE 1 TABLET BY MOUTH EVERY EVENING. 90 tablet 2   famotidine (PEPCID) 40 MG tablet Take 40 mg by mouth daily.     febuxostat (ULORIC) 40 MG tablet Take 40 mg by mouth at bedtime.     ferrous sulfate 325 (65 FE) MG EC tablet Take 325 mg by mouth 2 (two) times daily.      folic acid (FOLVITE) Q000111Q MCG tablet Take 800 mcg by mouth daily at 12 noon.     furosemide (LASIX) 20 MG tablet Take 40 mg by mouth 2 (two) times daily.     Glucosamine-Chondroitin (OSTEO BI-FLEX REGULAR STRENGTH PO) Take 1 tablet by mouth daily at 12 noon.     isosorbide mononitrate (IMDUR) 30 MG 24 hr tablet TAKE 1 TABLET BY MOUTH DAILY. 90 tablet 3   levothyroxine (SYNTHROID, LEVOTHROID) 50 MCG tablet Take 50 mcg by mouth daily before breakfast.  metoprolol succinate (TOPROL-XL) 50 MG 24 hr tablet Take 1 tablet (50 mg total) by mouth daily. Take with or immediately following a meal. 90 tablet 1   Multiple Vitamins-Minerals (PRESERVISION AREDS 2) CAPS Take 1 capsule by mouth 2 (two) times daily. Unknow strength     nitroGLYCERIN (NITROSTAT) 0.4 MG SL tablet Place 0.4 mg under the tongue every 5 (five) minutes as needed for chest pain.     omega-3 acid ethyl esters (LOVAZA) 1 g capsule Take 2 g by mouth daily.     pantoprazole (PROTONIX) 40 MG tablet TAKE 1 TABLET BY MOUTH DAILY 90 tablet 4   polyethylene glycol powder (GLYCOLAX/MIRALAX) powder Take 17 g by mouth every other day. Alternating between miralax and benefiber     pravastatin (PRAVACHOL) 40 MG tablet Take 40 mg by mouth at bedtime.     Probiotic Product (PRO-BIOTIC BLEND PO) Take 1 capsule by mouth daily at 12 noon. [place] Unknown strength     ranolazine (RANEXA) 1000 MG SR tablet TAKE 1 TABLET BY MOUTH 2 TIMES DAILY. 180 tablet 2   vitamin B-12 (CYANOCOBALAMIN) 1000 MCG tablet Take 1,000 mcg by mouth daily at 12 noon.     Wheat Dextrin (BENEFIBER PO) Take 1 Dose by mouth every other day. Alternating between  benefiber & miralax     zolpidem (AMBIEN) 5 MG tablet Take 5 mg by mouth at bedtime as needed for sleep.     Current Facility-Administered Medications  Medication Dose Route Frequency Provider Last Rate Last Admin   0.9 %  sodium chloride infusion  500 mL Intravenous Once Jackquline Denmark, MD        Allergies:   Ciprofloxacin, Contrast media [iodinated diagnostic agents], Nsaids, and Statins   Social History:  The patient  reports that she has quit smoking. She has never used smokeless tobacco. She reports that she does not drink alcohol and does not use drugs.   Family History:  The patient's family history includes Breast cancer in her mother; Cancer in her maternal grandfather; Diabetes in her sister; Heart attack in her brother, maternal uncle, and sister; Hypertension in her father; Prostate cancer in her father; Stroke in her father.   ROS:  Please see the history of present illness.   Otherwise, review of systems is positive for none.   All other systems are reviewed and negative.   PHYSICAL EXAM: VS:  BP 132/68   Pulse 79   Ht '5\' 3"'$  (1.6 m)   Wt 184 lb (83.5 kg)   BMI 32.59 kg/m  , BMI Body mass index is 32.59 kg/m. GEN: Well nourished, well developed, in no acute distress  HEENT: normal  Neck: no JVD, carotid bruits, or masses Cardiac: Irregular\; no murmurs, rubs, or gallops,no edema  Respiratory:  clear to auscultation bilaterally, normal work of breathing GI: soft, nontender, nondistended, + BS MS: no deformity or atrophy  Skin: warm and dry, device site well healed Neuro:  Strength and sensation are intact Psych: euthymic mood, full affect  EKG:  EKG is ordered today. Personal review of the ekg ordered  shows atrial flutter, rate 79, ventricular paced  Personal review of the device interrogation today. Results in [facility]: 05/16/2020: Hemoglobin 10.5 06/10/2020: NT-Pro BNP 2,349 06/17/2020: BUN 50; Creatinine, Ser 1.78; Potassium 4.5; Sodium 139     Lipid Panel     Component Value Date/Time   CHOL 125 09/05/2019 0842   TRIG 162 (H) 09/05/2019 0842   HDL 59 09/05/2019  0842   CHOLHDL 2.1 09/05/2019 0842   LDLCALC 39 09/05/2019 0842     Wt Readings from Last 3 Encounters:  07/22/20 184 lb (83.5 kg)  07/17/20 184 lb (83.5 kg)  06/10/20 182 lb (82.6 kg)      Other studies Reviewed: Additional studies/ records that were reviewed today include: TTE 12/22/2017 Review of the above records today demonstrates:  - Left ventricle: The cavity size was normal. Wall thickness was   increased in a pattern of moderate LVH. Systolic function was   normal. The estimated ejection fraction was in the range of 60%   to 65%. Wall motion was normal; there were no regional wall   motion abnormalities. Features are consistent with a pseudonormal   left ventricular filling pattern, with concomitant abnormal   relaxation and increased filling pressure (grade 2 diastolic   dysfunction). - Aortic valve: There was mild stenosis. Valve area (VTI): 1.57   cm^2. Valve area (Vmax): 1.45 cm^2. Valve area (Vmean): 1.57   cm^2. - Mitral valve: There was mild regurgitation. Valve area by   pressure half-time: 2.34 cm^2. - Left atrium: The atrium was moderately dilated.   ASSESSMENT AND PLAN:  1.  Persistent atrial fibrillation: Status post watchman and thus not anticoagulated.  CHA2DS2-VASc of 7.  Unfortunately she is back in atrial fibrillation.  We discussed the possibility of starting amiodarone.  We are limited in our antiarrhythmic medications due to her CKD.  She states that she has been on amiodarone in the past.  We Oaklee Sunga discuss this with her primary cardiologist.  We Jamesetta Greenhalgh get back to her on an answer.  It may be, that if she can be on amiodarone, we Adriane Guglielmo start this and plan for cardioversion.  We Behr Cislo plan to start amiodarone load today.  We Panhia Karl plan for cardioversion after she has been loaded on amiodarone.  She Lenoard Helbert need anticoagulation around  the time of her cardioversion.  We Brannon Decaire start Eliquis today and continue this for 1 month unless there are bleeding issues.  2.  Obstructive sleep apnea: CPAP compliance encouraged  3.  Hypertension: Currently well controlled  4.  Syncope with complete heart block: Long pauses noted on cardiac monitor.  He is status post Medtronic dual-chamber pacemaker implanted 04/24/2019.  Device functioning appropriately.  No changes.     Current medicines are reviewed at length with the patient today.   The patient does not have concerns regarding her medicines.  The following changes were made today: start amiodarone  Labs/ tests ordered today include:  Orders Placed This Encounter  Procedures   EKG 12-Lead     Disposition:   FU with Hajar Penninger 5month  Signed, Chardonnay Holzmann MMeredith Leeds MD  07/22/2020 12:55 PM     [id number]SR[address] ForestGSafety HarborNC [phone](office) [phone](fax)

## 2020-07-22 NOTE — Patient Instructions (Addendum)
  Medication Instructions:  Your physician has recommended you make the following change in your medication: INCREASE Toprol to 50 mg twice daily  *If you need a refill on your cardiac medications before your next appointment, please call your pharmacy*   Lab Work: None ordered  Testing/Procedures: None ordered   Follow-Up: At [facility], you and your health needs are our priority.  As part of our continuing mission to provide you with exceptional heart care, we have created designated Provider Care Teams.  These Care Teams include your primary Cardiologist (physician) and Advanced Practice Providers (APPs -  Physician Assistants and Nurse Practitioners) who all work together to provide you with the care you need, when you need it.  Remote monitoring is used to monitor your Pacemaker or ICD from home. This monitoring reduces the number of office visits required to check your device to one time per year. It allows [place] to keep an eye on the functioning of your device to ensure it is working properly. You are scheduled for a device check from home on 10/21/2020. You may send your transmission at any time that day. If you have a wireless device, the transmission will be sent automatically. After your physician reviews your transmission, you will receive a postcard with your next transmission date.  Your next appointment:     to be determined  The format for your next appointment:   In Person  Provider:   Allegra Lai, MD   Thank you for choosing [facility]!!   Trinidad Curet, RN [phone]

## 2020-07-23 LAB — CUP PACEART REMOTE DEVICE CHECK
Battery Remaining Longevity: 164 mo
Battery Voltage: 3.06 V
Brady Statistic AP VP Percent: 0.03 %
Brady Statistic AP VS Percent: 61.69 %
Brady Statistic AS VP Percent: 0.02 %
Brady Statistic AS VS Percent: 38.36 %
Brady Statistic RA Percent Paced: 7.1 %
Brady Statistic RV Percent Paced: 18.08 %
Date Time Interrogation Session: 20220620054821
Implantable Lead Implant Date: 20210322
Implantable Lead Implant Date: 20210322
Implantable Lead Location: 753859
Implantable Lead Location: 753860
Implantable Lead Model: 5076
Implantable Lead Model: 5076
Implantable Pulse Generator Implant Date: 20210322
Lead Channel Impedance Value: 323 Ohm
Lead Channel Impedance Value: 399 Ohm
Lead Channel Impedance Value: 456 Ohm
Lead Channel Impedance Value: 551 Ohm
Lead Channel Pacing Threshold Amplitude: 0.625 V
Lead Channel Pacing Threshold Amplitude: 0.875 V
Lead Channel Pacing Threshold Pulse Width: 0.4 ms
Lead Channel Pacing Threshold Pulse Width: 0.4 ms
Lead Channel Sensing Intrinsic Amplitude: 5.25 mV
Lead Channel Sensing Intrinsic Amplitude: 5.25 mV
Lead Channel Sensing Intrinsic Amplitude: 9.375 mV
Lead Channel Sensing Intrinsic Amplitude: 9.375 mV
Lead Channel Setting Pacing Amplitude: 1.75 V
Lead Channel Setting Pacing Amplitude: 2.5 V
Lead Channel Setting Pacing Pulse Width: 0.4 ms
Lead Channel Setting Sensing Sensitivity: 2 mV

## 2020-07-30 ENCOUNTER — Telehealth: Payer: Self-pay | Admitting: Cardiology

## 2020-07-30 DIAGNOSIS — Z1231 Encounter for screening mammogram for malignant neoplasm of breast: Secondary | ICD-10-CM | POA: Diagnosis not present

## 2020-07-30 MED ORDER — METOPROLOL SUCCINATE ER 50 MG PO TB24: 50.0000 mg | ORAL_TABLET | Freq: Every day | ORAL | 3 refills | Status: DC

## 2020-07-30 NOTE — Telephone Encounter (Signed)
Rx corrected, informed pt and pharmacy

## 2020-07-30 NOTE — Telephone Encounter (Signed)
    Pt c/o medication issue:  1. Name of Medication:   metoprolol succinate (TOPROL-XL) 50 MG 24 hr tablet    2. How are you currently taking this medication (dosage and times per day)? Take 1 tablet (50 mg total) by mouth daily. Take with or immediately following a meal.  3. Are you having a reaction (difficulty breathing--STAT)?   4. What is your medication issue? Caryl Pina with [facility] calling, she said they received new dose of pt's metoprolol. However, when pt is picking med up she was told by pt that she needs to take metoprolol 50 mg 1 in the morning and 1 tablet at night but script says to take 1 tablet daily. She wanted to confirm dosage

## 2020-08-01 ENCOUNTER — Telehealth: Payer: Self-pay | Admitting: *Deleted

## 2020-08-01 MED ORDER — APIXABAN 5 MG PO TABS: 5.0000 mg | ORAL_TABLET | Freq: Two times a day (BID) | ORAL | 3 refills | Status: DC

## 2020-08-01 NOTE — Telephone Encounter (Signed)
Informed pt that Dr. Curt Bears recommends she start Amiodarone, start blood thinner (take for short time), and arrange TEE/DCCV. Pt agreeable to plan. Advised to hold Plavix temporarily, per Dr. Curt Bears Will start full dose Eliquis and send in Rx.  She will stop by the [facility] office tomorrow to pick up 30 day free card. We will talk next week about starting Amiodarone and scheduling procedure. Patient verbalized understanding and agreeable to plan.

## 2020-08-02 ENCOUNTER — Telehealth: Payer: Self-pay | Admitting: Cardiology

## 2020-08-02 NOTE — Telephone Encounter (Signed)
Eliquis 5 mg samples set aside for patient in sample closet in [place] office as well as 30 day free card.

## 2020-08-08 NOTE — Progress Notes (Signed)
Remote pacemaker transmission.   

## 2020-08-12 ENCOUNTER — Other Ambulatory Visit: Payer: Self-pay | Admitting: Cardiology

## 2020-08-14 ENCOUNTER — Other Ambulatory Visit: Payer: Self-pay | Admitting: Cardiology

## 2020-08-19 ENCOUNTER — Telehealth: Payer: Self-pay | Admitting: *Deleted

## 2020-08-19 DIAGNOSIS — I480 Paroxysmal atrial fibrillation: Secondary | ICD-10-CM

## 2020-08-19 DIAGNOSIS — Z01812 Encounter for preprocedural laboratory examination: Secondary | ICD-10-CM

## 2020-08-19 MED ORDER — AMIODARONE HCL 200 MG PO TABS: 200.0000 mg | ORAL_TABLET | Freq: Every day | ORAL | 1 refills | Status: DC

## 2020-08-19 MED ORDER — AMIODARONE HCL 200 MG PO TABS: 200.0000 mg | ORAL_TABLET | Freq: Every day | ORAL | 0 refills | Status: DC

## 2020-08-19 NOTE — Telephone Encounter (Signed)
Pt called back. Pt aware Amiodarone Rx sent to pharmacy, instructions reviewed and sent via mychart. Pt started Eliqius (temporarily) on 7/1. Pt aware I will be in touch to go over DCCV instructions and will send via mychart. Patient verbalized understanding and agreeable to plan.

## 2020-08-19 NOTE — Telephone Encounter (Signed)
Left message to call back to discuss starting Amiodarone, scheduling DCCV

## 2020-08-20 MED ORDER — AMIODARONE HCL 200 MG PO TABS: ORAL_TABLET | ORAL | 0 refills | Status: DC

## 2020-08-22 DIAGNOSIS — N3091 Cystitis, unspecified with hematuria: Secondary | ICD-10-CM | POA: Diagnosis not present

## 2020-08-22 DIAGNOSIS — R300 Dysuria: Secondary | ICD-10-CM | POA: Diagnosis not present

## 2020-08-26 ENCOUNTER — Encounter: Payer: Self-pay | Admitting: Cardiology

## 2020-08-26 ENCOUNTER — Other Ambulatory Visit: Payer: Self-pay | Admitting: *Deleted

## 2020-08-26 ENCOUNTER — Telehealth (INDEPENDENT_AMBULATORY_CARE_PROVIDER_SITE_OTHER): Payer: Medicare Other | Admitting: Cardiology

## 2020-08-26 VITALS — BP 138/70 | HR 76 | Ht 63.0 in | Wt 183.0 lb

## 2020-08-26 DIAGNOSIS — Z01812 Encounter for preprocedural laboratory examination: Secondary | ICD-10-CM

## 2020-08-26 DIAGNOSIS — N390 Urinary tract infection, site not specified: Secondary | ICD-10-CM | POA: Diagnosis not present

## 2020-08-26 DIAGNOSIS — I480 Paroxysmal atrial fibrillation: Secondary | ICD-10-CM

## 2020-08-26 DIAGNOSIS — I4819 Other persistent atrial fibrillation: Secondary | ICD-10-CM

## 2020-08-26 DIAGNOSIS — N3090 Cystitis, unspecified without hematuria: Secondary | ICD-10-CM | POA: Diagnosis not present

## 2020-08-26 NOTE — H&P (View-Only) (Signed)
Electrophysiology TeleHealth Note   Due to national recommendations of social distancing due to COVID 19, an audio/video telehealth visit is felt to be most appropriate for this patient at this time.  See Epic message for the patient's consent to telehealth for [facility].   Date:  08/26/2020   ID:  KC PALMS, DOB 03-15-1940, MRN [id number]  Location: patient's home  Provider location: [address] [place]  Evaluation Performed: Follow-up visit  PCP:  Street, Sharon Mt, MD  Cardiologist:  Jenne Campus, MD  Electrophysiologist:  Dr Curt Bears  Chief Complaint:  AF  History of Present Illness:    Amanda [name] is a 80 y.o. female who presents via audio/video conferencing for a telehealth visit today.  Since last being seen in our clinic, the patient reports doing very well.  Today, she denies symptoms of palpitations, chest pain, shortness of breath,  lower extremity edema, dizziness, presyncope, or syncope.  The patient is otherwise without complaint today.  The patient denies symptoms of fevers, chills, cough, or new SOB worrisome for COVID 19.  She has a history significant for ischemic stroke, atrial fibrillation, CKD stage III, diabetes, hypertension, OSA.  She has a watchman device implanted due to recurrent GI bleeding.  She also has had episodes of syncope.  She wore a cardiac monitor that showed prolonged pauses and is now status post Medtronic dual-chamber pacemaker.  Unfortunately she is back into atrial fibrillation, which has become persistent.  She has been started on amiodarone and has plans for cardioversion  Today, denies symptoms of palpitations, chest pain, shortness of breath, orthopnea, PND, lower extremity edema, claudication, dizziness, presyncope, syncope, bleeding, or neurologic sequela. The patient is tolerating medications without difficulties.  Her main symptoms are weakness and fatigue.  She understands that her weakness and  fatigue is likely due to her atrial fibrillation.  She has somewhat difficulty with her daily activities.  She is ready to get back into normal rhythm  Past Medical History:  Diagnosis Date   Acute ischemic stroke ([place])    Acute metabolic encephalopathy    Acute on chronic systolic (congestive) heart failure ([place])    Acute renal insufficiency 09/02/2016   Anemia due to stage 4 chronic kidney disease ([place]) 04/04/2015   Atrial fibrillation (HCC)    Benign hypertension with CKD (chronic kidney disease) stage IV ([place]) 04/04/2015   Bradycardia 09/17/2014   Chest pain 06/23/2016   Overview:  Added automatically from request for surgery [id number]   Chronic anemia    Chronic kidney disease (CKD), stage III (moderate) ([place])    Coronary artery disease 10/12/2016   Non drug-eluting stent implanted in June 2018 to mid RCA   08/08/2016 10:37  Angiographic Findings  Cardiac Arteries and Lesion Findings LMCA: 0% and Normal. LAD: 0% and Normal. RCA:   Lesion on Mid RCA: Mid subsection.95% stenosis reduced to 0%. Pre procedure   TIMI II flow was noted. Post Procedure TIMI III flow was present. Poor run   off was present. The lesion was diagnosed as High Risk (C).    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 ([place]) 04/04/2015   Diabetes mellitus without complication ([place])    Diverticulosis    Dyslipidemia 09/17/2014   Gastroesophageal reflux    GI bleed 08/06/2016   Heart block AV complete ([place]) 09/05/2019   History of cardiac monitoring 02/23/2018   monitor inserted   Hyperlipidemia    Hypertensive heart disease with heart  failure ([place])    Iron deficiency anemia 12/28/2017   LV dysfunction 09/02/2016   Myocardial infarction [facility])    NSTEMI (non-ST elevated myocardial infarction) ([place]) 08/06/2016   Obstructive sleep apnea 09/17/2014   Orthostatic hypotension 12/28/2017   OSA on CPAP    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   Postural dizziness with presyncope 09/23/2017   Presence of Watchman left  atrial appendage closure device 06/24/2017   Recurrent left pleural effusion 03/14/2015   Renal failure, chronic, stage 3 (moderate) ([place])    Syncope [id number]   Systolic heart failure ([place])    Thyroid disease    Vitamin D deficiency 04/04/2015    Past Surgical History:  Procedure Laterality Date   BUBBLE STUDY  05/16/2020   Procedure: BUBBLE STUDY;  Surgeon: Elouise Munroe, MD;  Location: [place];  Service: Cardiovascular;;   CARDIAC CATHETERIZATION     CARDIOVERSION N/A 05/16/2020   Procedure: CARDIOVERSION;  Surgeon: Elouise Munroe, MD;  Location: [facility] ENDOSCOPY;  Service: Cardiovascular;  Laterality: N/A;   COLONOSCOPY  11/21/2014   Moderate predominantly sigmoid diverticulosis. Otherwise noraml collonscopy to TI.    CORONARY ANGIOPLASTY WITH STENT PLACEMENT  08/2016   EXCISIONAL HEMORRHOIDECTOMY     LEFT ATRIAL APPENDAGE OCCLUSION  11/2016   in [place] N/A 02/23/2018   Procedure: [place];  Surgeon: Constance Haw, MD;  Location: [place] CV LAB;  Service: Cardiovascular;  Laterality: N/A;   LOOP RECORDER REMOVAL N/A 04/24/2019   Procedure: LOOP RECORDER REMOVAL;  Surgeon: Constance Haw, MD;  Location: [place] CV LAB;  Service: Cardiovascular;  Laterality: N/A;   NOSE SURGERY     PACEMAKER IMPLANT N/A 04/24/2019   Procedure: PACEMAKER IMPLANT;  Surgeon: Constance Haw, MD;  Location: [place] CV LAB;  Service: Cardiovascular;  Laterality: N/A;   TEE WITHOUT CARDIOVERSION N/A 05/16/2020   Procedure: TRANSESOPHAGEAL ECHOCARDIOGRAM (TEE);  Surgeon: Elouise Munroe, MD;  Location: [facility] ENDOSCOPY;  Service: Cardiovascular;  Laterality: N/A;    Current Outpatient Medications  Medication Sig Dispense Refill   albuterol (VENTOLIN HFA) 108 (90 Base) MCG/ACT inhaler Inhale 2 puffs into the lungs every 6 (six) hours as needed for wheezing or shortness of breath.     amiodarone (PACERONE) 200 MG tablet Take 2  tablets (400 mg total) TWICE daily for 2 weeks, then take 1 tablet (200 mg total) TWICE daily for 2 weeks, then take 1 tablet daily 84 tablet 0   apixaban (ELIQUIS) 5 MG TABS tablet Take 1 tablet (5 mg total) by mouth 2 (two) times daily. 60 tablet 3   Bioflavonoid Products (BIOFLEX PO) Take 5 mg by mouth daily.     BIOTIN PO Take 600 mcg by mouth 2 (two) times daily.     Cholecalciferol (VITAMIN D3) 50 MCG (2000 UT) TABS Take 2,000 Units by mouth daily at 12 noon.     Coenzyme Q10 100 MG TABS Take 100 mg by mouth daily at 12 noon.      colchicine 0.6 MG tablet Take 0.6-1.2 mg by mouth See admin instructions. 1.'2mg'$  at onset of gout attack, then 0.'6mg'$  an hour later. Next day 0.'6mg'$  twice a day, then 0.'6mg'$  daily until gout pain subsides.     estradiol (ESTRACE) 0.1 MG/GM vaginal cream Place 1 Applicatorful vaginally every Monday, Wednesday, and Friday.      Evolocumab (REPATHA) 140 MG/ML SOSY Inject 140 mg into the skin every 14 (fourteen) days.     ezetimibe (  ZETIA) 10 MG tablet TAKE 1 TABLET BY MOUTH EVERY EVENING. 90 tablet 2   famotidine (PEPCID) 40 MG tablet Take 40 mg by mouth daily.     febuxostat (ULORIC) 40 MG tablet Take 40 mg by mouth at bedtime.     ferrous sulfate 325 (65 FE) MG EC tablet Take 325 mg by mouth 2 (two) times daily.      folic acid (FOLVITE) Q000111Q MCG tablet Take 800 mcg by mouth daily at 12 noon.     furosemide (LASIX) 20 MG tablet Take 40 mg by mouth 2 (two) times daily.     Glucosamine-Chondroitin (OSTEO BI-FLEX REGULAR STRENGTH PO) Take 1 tablet by mouth daily at 12 noon.     isosorbide mononitrate (IMDUR) 30 MG 24 hr tablet TAKE 1 TABLET BY MOUTH DAILY 90 tablet 3   levothyroxine (SYNTHROID, LEVOTHROID) 50 MCG tablet Take 50 mcg by mouth daily before breakfast.     metoprolol succinate (TOPROL-XL) 50 MG 24 hr tablet Take 1 tablet (50 mg total) by mouth daily. Take with or immediately following a meal. 90 tablet 3   Multiple Vitamins-Minerals (PRESERVISION AREDS 2) CAPS  Take 1 capsule by mouth 2 (two) times daily. Unknow strength     nitroGLYCERIN (NITROSTAT) 0.4 MG SL tablet Place 0.4 mg under the tongue every 5 (five) minutes as needed for chest pain.     omega-3 acid ethyl esters (LOVAZA) 1 g capsule Take 2 g by mouth daily.     pantoprazole (PROTONIX) 40 MG tablet TAKE 1 TABLET BY MOUTH DAILY 90 tablet 4   polyethylene glycol powder (GLYCOLAX/MIRALAX) powder Take 17 g by mouth every other day. Alternating between miralax and benefiber     pravastatin (PRAVACHOL) 40 MG tablet Take 40 mg by mouth at bedtime.     Probiotic Product (PRO-BIOTIC BLEND PO) Take 1 capsule by mouth daily at 12 noon. Florajen Unknown strength     ranolazine (RANEXA) 1000 MG SR tablet TAKE 1 TABLET BY MOUTH 2 TIMES DAILY. 180 tablet 2   vitamin B-12 (CYANOCOBALAMIN) 1000 MCG tablet Take 1,000 mcg by mouth daily at 12 noon.     Wheat Dextrin (BENEFIBER PO) Take 1 Dose by mouth every other day. Alternating between benefiber & miralax     zolpidem (AMBIEN) 5 MG tablet Take 5 mg by mouth at bedtime as needed for sleep.     Current Facility-Administered Medications  Medication Dose Route Frequency Provider Last Rate Last Admin   0.9 %  sodium chloride infusion  500 mL Intravenous Once Jackquline Denmark, MD        Allergies:   Ciprofloxacin, Contrast media [iodinated diagnostic agents], Nsaids, and Statins   Social History:  The patient  reports that she has quit smoking. She has never used smokeless tobacco. She reports that she does not drink alcohol and does not use drugs.   Family History:  The patient's  family history includes Breast cancer in her mother; Cancer in her maternal grandfather; Diabetes in her sister; Heart attack in her brother, maternal uncle, and sister; Hypertension in her father; Prostate cancer in her father; Stroke in her father.   ROS:  Please see the history of present illness.   All other systems are personally reviewed and negative.    Exam:    Vital Signs:   BP 138/70   Pulse 76   Ht '5\' 3"'$  (1.6 m)   Wt 183 lb (83 kg)   BMI 32.42 kg/m   Well appearing, alert  and conversant, regular work of breathing,  good skin color Eyes- anicteric, neuro- grossly intact, skin- no apparent rash or lesions or cyanosis, mouth- oral mucosa is pink   Labs/Other Tests and Data Reviewed:    Recent Labs: 05/16/2020: Hemoglobin 10.5 06/10/2020: NT-Pro BNP 2,349 06/17/2020: BUN 50; Creatinine, Ser 1.78; Potassium 4.5; Sodium 139   Wt Readings from Last 3 Encounters:  08/26/20 183 lb (83 kg)  07/22/20 184 lb (83.5 kg)  07/17/20 184 lb (83.5 kg)     Other studies personally reviewed: Additional studies/ records that were reviewed today include: Epic notes  Last device remote is reviewed from [place] PDF dated 07/23/2020 which reveals normal device function, persistent atrial fibrillation noted   ASSESSMENT & PLAN:    1.  Persistent atrial fibrillation: Currently on Eliquis.  CHA2DS2-VASc of 7.  Is recently started on amiodarone.  High risk medication monitoring.  As she remains in atrial fibrillation, Ayza Ripoll plan for cardioversion.  2.  Obstructive sleep apnea: CPAP compliance encouraged  3.  Hypertension: Overall well controlled  4.  Syncope with complete heart block: Status post Medtronic dual-chamber pacemaker implanted 04/24/2019.  Device functioning appropriately.  No changes.   COVID 19 screen The patient denies symptoms of COVID 19 at this time.  The importance of social distancing was discussed today.  Follow-up:  3 months   Current medicines are reviewed at length with the patient today.   The patient does not have concerns regarding her medicines.  The following changes were made today:  none  Labs/ tests ordered today include:  No orders of the defined types were placed in this encounter.    Patient Risk:  after full review of this patients clinical status, I feel that they are at moderate risk at this time.     Signed, Moneka Mcquinn Meredith Leeds, MD  08/26/2020 2:31 PM     [place] [address] [place] [address] [phone] (office) [phone] (fax)

## 2020-08-26 NOTE — Progress Notes (Addendum)
Electrophysiology TeleHealth Note   Due to national recommendations of social distancing due to COVID 19, an audio/video telehealth visit is felt to be most appropriate for this patient at this time.  See Epic message for the patient's consent to telehealth for [facility].   Date:  08/26/2020   ID:  Amanda [name], DOB 12-15-1940, MRN [id number]  Location: patient's home  Provider location: [address] [place]  Evaluation Performed: Follow-up visit  PCP:  Street, Sharon Mt, MD  Cardiologist:  Jenne Campus, MD  Electrophysiologist:  Dr Curt Bears  Chief Complaint:  AF  History of Present Illness:    Amanda [name] is a 80 y.o. female who presents via audio/video conferencing for a telehealth visit today.  Since last being seen in our clinic, the patient reports doing very well.  Today, she denies symptoms of palpitations, chest pain, shortness of breath,  lower extremity edema, dizziness, presyncope, or syncope.  The patient is otherwise without complaint today.  The patient denies symptoms of fevers, chills, cough, or new SOB worrisome for COVID 19.  She has a history significant for ischemic stroke, atrial fibrillation, CKD stage III, diabetes, hypertension, OSA.  She has a watchman device implanted due to recurrent GI bleeding.  She also has had episodes of syncope.  She wore a cardiac monitor that showed prolonged pauses and is now status post Medtronic dual-chamber pacemaker.  Unfortunately she is back into atrial fibrillation, which has become persistent.  She has been started on amiodarone and has plans for cardioversion  Today, denies symptoms of palpitations, chest pain, shortness of breath, orthopnea, PND, lower extremity edema, claudication, dizziness, presyncope, syncope, bleeding, or neurologic sequela. The patient is tolerating medications without difficulties.  Her main symptoms are weakness and fatigue.  She understands that her weakness and  fatigue is likely due to her atrial fibrillation.  She has somewhat difficulty with her daily activities.  She is ready to get back into normal rhythm  Past Medical History:  Diagnosis Date   Acute ischemic stroke ([place])    Acute metabolic encephalopathy    Acute on chronic systolic (congestive) heart failure ([place])    Acute renal insufficiency 09/02/2016   Anemia due to stage 4 chronic kidney disease ([place]) 04/04/2015   Atrial fibrillation (HCC)    Benign hypertension with CKD (chronic kidney disease) stage IV ([place]) 04/04/2015   Bradycardia 09/17/2014   Chest pain 06/23/2016   Overview:  Added automatically from request for surgery [id number]   Chronic anemia    Chronic kidney disease (CKD), stage III (moderate) ([place])    Coronary artery disease 10/12/2016   Non drug-eluting stent implanted in June 2018 to mid RCA   08/08/2016 10:37  Angiographic Findings  Cardiac Arteries and Lesion Findings LMCA: 0% and Normal. LAD: 0% and Normal. RCA:   Lesion on Mid RCA: Mid subsection.95% stenosis reduced to 0%. Pre procedure   TIMI II flow was noted. Post Procedure TIMI III flow was present. Poor run   off was present. The lesion was diagnosed as High Risk (C).    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 ([place]) 04/04/2015   Diabetes mellitus without complication ([place])    Diverticulosis    Dyslipidemia 09/17/2014   Gastroesophageal reflux    GI bleed 08/06/2016   Heart block AV complete ([place]) 09/05/2019   History of cardiac monitoring 02/23/2018   monitor inserted   Hyperlipidemia    Hypertensive heart disease with heart  failure ([place])    Iron deficiency anemia 12/28/2017   LV dysfunction 09/02/2016   Myocardial infarction [facility])    NSTEMI (non-ST elevated myocardial infarction) ([place]) 08/06/2016   Obstructive sleep apnea 09/17/2014   Orthostatic hypotension 12/28/2017   OSA on CPAP    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   Postural dizziness with presyncope 09/23/2017   Presence of Watchman left  atrial appendage closure device 06/24/2017   Recurrent left pleural effusion 03/14/2015   Renal failure, chronic, stage 3 (moderate) ([place])    Syncope 123XX123   Systolic heart failure ([place])    Thyroid disease    Vitamin D deficiency 04/04/2015    Past Surgical History:  Procedure Laterality Date   BUBBLE STUDY  05/16/2020   Procedure: BUBBLE STUDY;  Surgeon: Elouise Munroe, MD;  Location: [place];  Service: Cardiovascular;;   CARDIAC CATHETERIZATION     CARDIOVERSION N/A 05/16/2020   Procedure: CARDIOVERSION;  Surgeon: Elouise Munroe, MD;  Location: [facility] ENDOSCOPY;  Service: Cardiovascular;  Laterality: N/A;   COLONOSCOPY  11/21/2014   Moderate predominantly sigmoid diverticulosis. Otherwise noraml collonscopy to TI.    CORONARY ANGIOPLASTY WITH STENT PLACEMENT  08/2016   EXCISIONAL HEMORRHOIDECTOMY     LEFT ATRIAL APPENDAGE OCCLUSION  11/2016   in [place] N/A 02/23/2018   Procedure: [place];  Surgeon: Constance Haw, MD;  Location: [place] CV LAB;  Service: Cardiovascular;  Laterality: N/A;   LOOP RECORDER REMOVAL N/A 04/24/2019   Procedure: LOOP RECORDER REMOVAL;  Surgeon: Constance Haw, MD;  Location: [place] CV LAB;  Service: Cardiovascular;  Laterality: N/A;   NOSE SURGERY     PACEMAKER IMPLANT N/A 04/24/2019   Procedure: PACEMAKER IMPLANT;  Surgeon: Constance Haw, MD;  Location: [place] CV LAB;  Service: Cardiovascular;  Laterality: N/A;   TEE WITHOUT CARDIOVERSION N/A 05/16/2020   Procedure: TRANSESOPHAGEAL ECHOCARDIOGRAM (TEE);  Surgeon: Elouise Munroe, MD;  Location: [facility] ENDOSCOPY;  Service: Cardiovascular;  Laterality: N/A;    Current Outpatient Medications  Medication Sig Dispense Refill   albuterol (VENTOLIN HFA) 108 (90 Base) MCG/ACT inhaler Inhale 2 puffs into the lungs every 6 (six) hours as needed for wheezing or shortness of breath.     amiodarone (PACERONE) 200 MG tablet Take 2  tablets (400 mg total) TWICE daily for 2 weeks, then take 1 tablet (200 mg total) TWICE daily for 2 weeks, then take 1 tablet daily 84 tablet 0   apixaban (ELIQUIS) 5 MG TABS tablet Take 1 tablet (5 mg total) by mouth 2 (two) times daily. 60 tablet 3   Bioflavonoid Products (BIOFLEX PO) Take 5 mg by mouth daily.     BIOTIN PO Take 600 mcg by mouth 2 (two) times daily.     Cholecalciferol (VITAMIN D3) 50 MCG (2000 UT) TABS Take 2,000 Units by mouth daily at 12 noon.     Coenzyme Q10 100 MG TABS Take 100 mg by mouth daily at 12 noon.      colchicine 0.6 MG tablet Take 0.6-1.2 mg by mouth See admin instructions. 1.'2mg'$  at onset of gout attack, then 0.'6mg'$  an hour later. Next day 0.'6mg'$  twice a day, then 0.'6mg'$  daily until gout pain subsides.     estradiol (ESTRACE) 0.1 MG/GM vaginal cream Place 1 Applicatorful vaginally every Monday, Wednesday, and Friday.      Evolocumab (REPATHA) 140 MG/ML SOSY Inject 140 mg into the skin every 14 (fourteen) days.     ezetimibe (  ZETIA) 10 MG tablet TAKE 1 TABLET BY MOUTH EVERY EVENING. 90 tablet 2   famotidine (PEPCID) 40 MG tablet Take 40 mg by mouth daily.     febuxostat (ULORIC) 40 MG tablet Take 40 mg by mouth at bedtime.     ferrous sulfate 325 (65 FE) MG EC tablet Take 325 mg by mouth 2 (two) times daily.      folic acid (FOLVITE) Q000111Q MCG tablet Take 800 mcg by mouth daily at 12 noon.     furosemide (LASIX) 20 MG tablet Take 40 mg by mouth 2 (two) times daily.     Glucosamine-Chondroitin (OSTEO BI-FLEX REGULAR STRENGTH PO) Take 1 tablet by mouth daily at 12 noon.     isosorbide mononitrate (IMDUR) 30 MG 24 hr tablet TAKE 1 TABLET BY MOUTH DAILY 90 tablet 3   levothyroxine (SYNTHROID, LEVOTHROID) 50 MCG tablet Take 50 mcg by mouth daily before breakfast.     metoprolol succinate (TOPROL-XL) 50 MG 24 hr tablet Take 1 tablet (50 mg total) by mouth daily. Take with or immediately following a meal. 90 tablet 3   Multiple Vitamins-Minerals (PRESERVISION AREDS 2) CAPS  Take 1 capsule by mouth 2 (two) times daily. Unknow strength     nitroGLYCERIN (NITROSTAT) 0.4 MG SL tablet Place 0.4 mg under the tongue every 5 (five) minutes as needed for chest pain.     omega-3 acid ethyl esters (LOVAZA) 1 g capsule Take 2 g by mouth daily.     pantoprazole (PROTONIX) 40 MG tablet TAKE 1 TABLET BY MOUTH DAILY 90 tablet 4   polyethylene glycol powder (GLYCOLAX/MIRALAX) powder Take 17 g by mouth every other day. Alternating between miralax and benefiber     pravastatin (PRAVACHOL) 40 MG tablet Take 40 mg by mouth at bedtime.     Probiotic Product (PRO-BIOTIC BLEND PO) Take 1 capsule by mouth daily at 12 noon. Florajen Unknown strength     ranolazine (RANEXA) 1000 MG SR tablet TAKE 1 TABLET BY MOUTH 2 TIMES DAILY. 180 tablet 2   vitamin B-12 (CYANOCOBALAMIN) 1000 MCG tablet Take 1,000 mcg by mouth daily at 12 noon.     Wheat Dextrin (BENEFIBER PO) Take 1 Dose by mouth every other day. Alternating between benefiber & miralax     zolpidem (AMBIEN) 5 MG tablet Take 5 mg by mouth at bedtime as needed for sleep.     Current Facility-Administered Medications  Medication Dose Route Frequency Provider Last Rate Last Admin   0.9 %  sodium chloride infusion  500 mL Intravenous Once Jackquline Denmark, MD        Allergies:   Ciprofloxacin, Contrast media [iodinated diagnostic agents], Nsaids, and Statins   Social History:  The patient  reports that she has quit smoking. She has never used smokeless tobacco. She reports that she does not drink alcohol and does not use drugs.   Family History:  The patient's  family history includes Breast cancer in her mother; Cancer in her maternal grandfather; Diabetes in her sister; Heart attack in her brother, maternal uncle, and sister; Hypertension in her father; Prostate cancer in her father; Stroke in her father.   ROS:  Please see the history of present illness.   All other systems are personally reviewed and negative.    Exam:    Vital Signs:   BP 138/70   Pulse 76   Ht '5\' 3"'$  (1.6 m)   Wt 183 lb (83 kg)   BMI 32.42 kg/m   Well appearing, alert  and conversant, regular work of breathing,  good skin color Eyes- anicteric, neuro- grossly intact, skin- no apparent rash or lesions or cyanosis, mouth- oral mucosa is pink   Labs/Other Tests and Data Reviewed:    Recent Labs: 05/16/2020: Hemoglobin 10.5 06/10/2020: NT-Pro BNP 2,349 06/17/2020: BUN 50; Creatinine, Ser 1.78; Potassium 4.5; Sodium 139   Wt Readings from Last 3 Encounters:  08/26/20 183 lb (83 kg)  07/22/20 184 lb (83.5 kg)  07/17/20 184 lb (83.5 kg)     Other studies personally reviewed: Additional studies/ records that were reviewed today include: Epic notes  Last device remote is reviewed from [place] PDF dated 07/23/2020 which reveals normal device function, persistent atrial fibrillation noted   ASSESSMENT & PLAN:    1.  Persistent atrial fibrillation: Currently on Eliquis.  CHA2DS2-VASc of 7.  Is recently started on amiodarone.  High risk medication monitoring.  As she remains in atrial fibrillation, Amanda [name] plan for cardioversion.  2.  Obstructive sleep apnea: CPAP compliance encouraged  3.  Hypertension: Overall well controlled  4.  Syncope with complete heart block: Status post Medtronic dual-chamber pacemaker implanted 04/24/2019.  Device functioning appropriately.  No changes.   COVID 19 screen The patient denies symptoms of COVID 19 at this time.  The importance of social distancing was discussed today.  Follow-up:  3 months Time spent for appointment: 30 minutes  Current medicines are reviewed at length with the patient today.   The patient does not have concerns regarding her medicines.  The following changes were made today:  none  Labs/ tests ordered today include:  No orders of the defined types were placed in this encounter.    Patient Risk:  after full review of this patients clinical status, I feel that they are at moderate risk at this  time.     Signed, Kaisen Ackers Meredith Leeds, MD  08/26/2020 2:31 PM     [place] [address] [place] [address] [phone] (office) [phone] (fax)

## 2020-08-26 NOTE — Telephone Encounter (Signed)
Spoke to pt.  Aware instructions will be sent by mychart. Pt will stop by the [place] office this week for pre procedure lab work. Pt scheduled for a virtual visit this afternoon for H&P.  She is not taking Sulfamethoxazole anymore, took only for 3 days.  Educated to interaction w/ Tikosyn and advised to make sure prescriber knows she is on Amiodarone and to avoid that medication in the future.  Patient verbalized understanding and agreeable to plan.

## 2020-08-27 ENCOUNTER — Telehealth: Payer: Self-pay | Admitting: Cardiology

## 2020-08-27 NOTE — Telephone Encounter (Signed)
Pt c/o medication issue:  1. Name of Medication: prolia shot  2. How are you currently taking this medication (dosage and times per day)? Injects every 6 months  3. Are you having a reaction (difficulty breathing--STAT)? no  4. What is your medication issue? Patient calling to speak with Sherri. She states she has her cardioversion 8/5, but her PCP informed her they are ordering her prolia shot. She would like to know if she can have both at the same time.

## 2020-08-27 NOTE — Telephone Encounter (Signed)
Pt aware ok to take Prolia shot and that it will not interfere w/ her DCCV. Pt appreciates the follow up

## 2020-08-27 NOTE — Telephone Encounter (Signed)
Prolia injection will not interfere with her cardioversion, she can receive her next dose as scheduled.

## 2020-08-28 ENCOUNTER — Other Ambulatory Visit: Payer: Self-pay | Admitting: Cardiology

## 2020-08-28 DIAGNOSIS — Z01812 Encounter for preprocedural laboratory examination: Secondary | ICD-10-CM | POA: Diagnosis not present

## 2020-08-28 DIAGNOSIS — M810 Age-related osteoporosis without current pathological fracture: Secondary | ICD-10-CM | POA: Diagnosis not present

## 2020-08-28 DIAGNOSIS — I480 Paroxysmal atrial fibrillation: Secondary | ICD-10-CM | POA: Diagnosis not present

## 2020-08-30 LAB — BASIC METABOLIC PANEL
BUN/Creatinine Ratio: 25 (ref 12–28)
BUN: 57 mg/dL — ABNORMAL HIGH (ref 8–27)
CO2: 22 mmol/L (ref 20–29)
Calcium: 10.5 mg/dL — ABNORMAL HIGH (ref 8.7–10.3)
Chloride: 103 mmol/L (ref 96–106)
Creatinine, Ser: 2.28 mg/dL — ABNORMAL HIGH (ref 0.57–1.00)
Glucose: 139 mg/dL — ABNORMAL HIGH (ref 65–99)
Potassium: 5 mmol/L (ref 3.5–5.2)
Sodium: 141 mmol/L (ref 134–144)
eGFR: 21 mL/min/{1.73_m2} — ABNORMAL LOW (ref >59)

## 2020-08-30 LAB — CBC
Hematocrit: 33.8 % — ABNORMAL LOW (ref 34.0–46.6)
Hemoglobin: 11.4 g/dL (ref 11.1–15.9)
MCH: 32.7 pg (ref 26.6–33.0)
MCHC: 33.7 g/dL (ref 31.5–35.7)
MCV: 97 fL (ref 79–97)
Platelets: 243 10*3/uL (ref 150–450)
RBC: 3.49 x10E6/uL — ABNORMAL LOW (ref 3.77–5.28)
RDW: 12.6 % (ref 11.7–15.4)
WBC: 6.4 10*3/uL (ref 3.4–10.8)

## 2020-09-05 NOTE — Anesthesia Preprocedure Evaluation (Addendum)
Anesthesia Evaluation  Patient identified by MRN, date of birth, ID band Patient awake    Reviewed: Allergy & Precautions, NPO status , Patient's Chart, lab work & pertinent test results  Airway Mallampati: II  TM Distance: >3 FB Neck ROM: Full    Dental no notable dental hx. (+) Teeth Intact, Dental Advisory Given   Pulmonary sleep apnea , former smoker,    Pulmonary exam normal breath sounds clear to auscultation       Cardiovascular hypertension, + Past MI (mi 2018) and + Cardiac Stents (2018)  Normal cardiovascular exam+ dysrhythmias Atrial Fibrillation  Rhythm:Irregular Rate:Normal     Neuro/Psych CVA    GI/Hepatic GERD  ,  Endo/Other  diabetes  Renal/GU      Musculoskeletal   Abdominal (+) + obese,   Peds  Hematology   Anesthesia Other Findings ALL:  Reproductive/Obstetrics                            Anesthesia Physical Anesthesia Plan  ASA: 3  Anesthesia Plan: General   Post-op Pain Management:    Induction: Intravenous  PONV Risk Score and Plan: Treatment may vary due to age or medical condition  Airway Management Planned: Nasal Cannula and Natural Airway  Additional Equipment: None  Intra-op Plan:   Post-operative Plan:   Informed Consent: I have reviewed the patients History and Physical, chart, labs and discussed the procedure including the risks, benefits and alternatives for the proposed anesthesia with the patient or authorized representative who has indicated his/her understanding and acceptance.     Dental advisory given  Plan Discussed with:   Anesthesia Plan Comments:        Anesthesia Quick Evaluation

## 2020-09-06 ENCOUNTER — Ambulatory Visit (HOSPITAL_COMMUNITY)
Admission: RE | Admit: 2020-09-06 | Discharge: 2020-09-06 | Disposition: A | Discharge status: Home / Self Care | Payer: Medicare Other | Attending: Cardiology | Admitting: Cardiology

## 2020-09-06 ENCOUNTER — Encounter (HOSPITAL_COMMUNITY): Payer: Self-pay | Admitting: Cardiology

## 2020-09-06 ENCOUNTER — Ambulatory Visit (HOSPITAL_COMMUNITY): Payer: Medicare Other | Admitting: Anesthesiology

## 2020-09-06 ENCOUNTER — Other Ambulatory Visit: Payer: Self-pay

## 2020-09-06 ENCOUNTER — Encounter (HOSPITAL_COMMUNITY)
Admission: RE | Disposition: A | Discharge status: Home / Self Care | Payer: Self-pay | Source: Home / Self Care | Attending: Cardiology

## 2020-09-06 DIAGNOSIS — Z91041 Radiographic dye allergy status: Secondary | ICD-10-CM | POA: Insufficient documentation

## 2020-09-06 DIAGNOSIS — E1122 Type 2 diabetes mellitus with diabetic chronic kidney disease: Secondary | ICD-10-CM | POA: Insufficient documentation

## 2020-09-06 DIAGNOSIS — Z87891 Personal history of nicotine dependence: Secondary | ICD-10-CM | POA: Diagnosis not present

## 2020-09-06 DIAGNOSIS — Z833 Family history of diabetes mellitus: Secondary | ICD-10-CM | POA: Diagnosis not present

## 2020-09-06 DIAGNOSIS — Z881 Allergy status to other antibiotic agents status: Secondary | ICD-10-CM | POA: Insufficient documentation

## 2020-09-06 DIAGNOSIS — I5022 Chronic systolic (congestive) heart failure: Secondary | ICD-10-CM | POA: Diagnosis not present

## 2020-09-06 DIAGNOSIS — Z950 Presence of cardiac pacemaker: Secondary | ICD-10-CM | POA: Diagnosis not present

## 2020-09-06 DIAGNOSIS — Z8249 Family history of ischemic heart disease and other diseases of the circulatory system: Secondary | ICD-10-CM | POA: Insufficient documentation

## 2020-09-06 DIAGNOSIS — I4819 Other persistent atrial fibrillation: Secondary | ICD-10-CM | POA: Insufficient documentation

## 2020-09-06 DIAGNOSIS — Z886 Allergy status to analgesic agent status: Secondary | ICD-10-CM | POA: Insufficient documentation

## 2020-09-06 DIAGNOSIS — Z888 Allergy status to other drugs, medicaments and biological substances status: Secondary | ICD-10-CM | POA: Diagnosis not present

## 2020-09-06 DIAGNOSIS — Z7951 Long term (current) use of inhaled steroids: Secondary | ICD-10-CM | POA: Diagnosis not present

## 2020-09-06 DIAGNOSIS — N183 Chronic kidney disease, stage 3 unspecified: Secondary | ICD-10-CM | POA: Diagnosis not present

## 2020-09-06 DIAGNOSIS — Z955 Presence of coronary angioplasty implant and graft: Secondary | ICD-10-CM | POA: Insufficient documentation

## 2020-09-06 DIAGNOSIS — I130 Hypertensive heart and chronic kidney disease with heart failure and stage 1 through stage 4 chronic kidney disease, or unspecified chronic kidney disease: Secondary | ICD-10-CM | POA: Insufficient documentation

## 2020-09-06 DIAGNOSIS — Z79899 Other long term (current) drug therapy: Secondary | ICD-10-CM | POA: Diagnosis not present

## 2020-09-06 DIAGNOSIS — Z8673 Personal history of transient ischemic attack (TIA), and cerebral infarction without residual deficits: Secondary | ICD-10-CM | POA: Insufficient documentation

## 2020-09-06 DIAGNOSIS — Z803 Family history of malignant neoplasm of breast: Secondary | ICD-10-CM | POA: Insufficient documentation

## 2020-09-06 DIAGNOSIS — Z7901 Long term (current) use of anticoagulants: Secondary | ICD-10-CM | POA: Insufficient documentation

## 2020-09-06 DIAGNOSIS — I4891 Unspecified atrial fibrillation: Secondary | ICD-10-CM | POA: Diagnosis not present

## 2020-09-06 DIAGNOSIS — G4733 Obstructive sleep apnea (adult) (pediatric): Secondary | ICD-10-CM | POA: Diagnosis not present

## 2020-09-06 DIAGNOSIS — E559 Vitamin D deficiency, unspecified: Secondary | ICD-10-CM | POA: Diagnosis not present

## 2020-09-06 DIAGNOSIS — Z9989 Dependence on other enabling machines and devices: Secondary | ICD-10-CM | POA: Diagnosis not present

## 2020-09-06 HISTORY — PX: CARDIOVERSION: SHX1299

## 2020-09-06 SURGERY — CARDIOVERSION
Anesthesia: General

## 2020-09-06 MED ORDER — PROPOFOL 10 MG/ML IV BOLUS
INTRAVENOUS | Status: DC | PRN
  Administered 2020-09-06: 60 mg via INTRAVENOUS

## 2020-09-06 MED ORDER — LIDOCAINE 2% (20 MG/ML) 5 ML SYRINGE
INTRAMUSCULAR | Status: DC | PRN
  Administered 2020-09-06: 60 mg via INTRAVENOUS

## 2020-09-06 MED ORDER — SODIUM CHLORIDE 0.9 % IV SOLN: INTRAVENOUS | Status: DC | PRN

## 2020-09-06 NOTE — Transfer of Care (Signed)
Immediate Anesthesia Transfer of Care Note  Patient: Amanda [name]  Procedure(s) Performed: CARDIOVERSION  Patient Location: Endoscopy Unit  Anesthesia Type:General  Level of Consciousness: awake, alert  and oriented  Airway & Oxygen Therapy: Patient Spontanous Breathing  Post-op Assessment: Report given to RN and Post -op Vital signs reviewed and stable  Post vital signs: Reviewed and stable  Last Vitals:  Vitals Value Taken Time  BP 108/43 09/06/20 1006  Temp    Pulse 48 09/06/20 1010  Resp 15 09/06/20 1010  SpO2 96 % 09/06/20 1010  Vitals shown include unvalidated device data.  Last Pain:  Vitals:   09/06/20 1004  TempSrc:   PainSc: 0-No pain         Complications: No notable events documented.

## 2020-09-06 NOTE — CV Procedure (Signed)
Procedure:   DCCV  Indication:  Symptomatic atrial fibrillation  Procedure Note:  The patient signed informed consent.  They have had had therapeutic anticoagulation with Eliquis greater than 3 weeks.  Anesthesia was administered by Dr. Valma Cava and Viann Fish, CRNA.  Adequate airway was maintained throughout and vital followed per protocol.  They were cardioverted x 1 with 200J of biphasic synchronized energy.  They converted to A-paced rhythm with rate 50bpm, confirmed with device interrogation.  There were no apparent complications.  The patient had normal neuro status and respiratory status post procedure with vitals stable as recorded elsewhere.    Follow up:  They will continue on current medical therapy and follow up with cardiology as scheduled.  Oswaldo Milian, MD 09/06/2020 10:09 AM

## 2020-09-06 NOTE — Interval H&P Note (Signed)
History and Physical Interval Note:  09/06/2020 9:51 AM  Amanda [name]  has presented today for surgery, with the diagnosis of AFIB.  The various methods of treatment have been discussed with the patient and family. After consideration of risks, benefits and other options for treatment, the patient has consented to  Procedure(s): CARDIOVERSION (N/A) as a surgical intervention.  The patient's history has been reviewed, patient examined, no change in status, stable for surgery.  I have reviewed the patient's chart and labs.  Questions were answered to the patient's satisfaction.     Donato Heinz

## 2020-09-06 NOTE — Anesthesia Procedure Notes (Signed)
Procedure Name: General with mask airway Date/Time: 09/06/2020 10:00 AM Performed by: Griffin Dakin, CRNA Pre-anesthesia Checklist: Patient identified, Emergency Drugs available, Suction available and Patient being monitored Patient Re-evaluated:Patient Re-evaluated prior to induction Oxygen Delivery Method: Ambu bag Induction Type: IV induction Ventilation: Mask ventilation without difficulty Number of attempts: 1 Placement Confirmation: positive ETCO2 and breath sounds checked- equal and bilateral Dental Injury: Teeth and Oropharynx as per pre-operative assessment

## 2020-09-06 NOTE — Anesthesia Postprocedure Evaluation (Signed)
Anesthesia Post Note  Patient: Amanda [name]  Procedure(s) Performed: CARDIOVERSION     Patient location during evaluation: Endoscopy Anesthesia Type: General Level of consciousness: awake and alert Pain management: pain level controlled Vital Signs Assessment: post-procedure vital signs reviewed and stable Respiratory status: spontaneous breathing, nonlabored ventilation, respiratory function stable and patient connected to nasal cannula oxygen Cardiovascular status: blood pressure returned to baseline and stable Postop Assessment: no apparent nausea or vomiting Anesthetic complications: no   No notable events documented.  Last Vitals:  Vitals:   09/06/20 1003 09/06/20 1004  BP:  (!) 124/53  Pulse: (!) 50 (!) 50  Resp: 15 20  Temp:    SpO2: 98% 98%    Last Pain:  Vitals:   09/06/20 1004  TempSrc:   PainSc: 0-No pain                 Barnet Glasgow

## 2020-09-08 LAB — POCT I-STAT, CHEM 8
BUN: 43 mg/dL — ABNORMAL HIGH (ref 8–23)
Calcium, Ion: 1.25 mmol/L (ref 1.15–1.40)
Chloride: 108 mmol/L (ref 98–111)
Creatinine, Ser: 1.7 mg/dL — ABNORMAL HIGH (ref 0.44–1.00)
Glucose, Bld: 156 mg/dL — ABNORMAL HIGH (ref 70–99)
HCT: 32 % — ABNORMAL LOW (ref 36.0–46.0)
Hemoglobin: 10.9 g/dL — ABNORMAL LOW (ref 12.0–15.0)
Potassium: 4.8 mmol/L (ref 3.5–5.1)
Sodium: 140 mmol/L (ref 135–145)
TCO2: 24 mmol/L (ref 22–32)

## 2020-09-10 ENCOUNTER — Encounter (HOSPITAL_COMMUNITY): Payer: Self-pay | Admitting: Cardiology

## 2020-10-18 ENCOUNTER — Telehealth: Payer: Self-pay | Admitting: Cardiology

## 2020-10-18 NOTE — Telephone Encounter (Signed)
Spoke to patient just now and she let me know that she has an appointment with Dr. Agustin Cree on the 21st and she was wanting to know what labs Dr. Agustin Cree would like to have done. She wants an order for the labs faxed over to Dr. Venetia Maxon once Dr. Agustin Cree advises. She is requesting that this be completed today.

## 2020-10-18 NOTE — Telephone Encounter (Signed)
Patient is requesting to have lab orders faxed to [facility] at fax #, [phone]. Please call patient to confirm.

## 2020-10-21 ENCOUNTER — Ambulatory Visit (INDEPENDENT_AMBULATORY_CARE_PROVIDER_SITE_OTHER): Payer: Medicare Other

## 2020-10-21 DIAGNOSIS — I442 Atrioventricular block, complete: Secondary | ICD-10-CM

## 2020-10-21 NOTE — Telephone Encounter (Signed)
Spoke to the patient just now and let her know that I have faxed a letter over requesting that they draw these labs for [place]. She verbalizes understanding and thanks me for doing so.    Encouraged patient to call back with any questions or concerns.

## 2020-10-22 DIAGNOSIS — Z6832 Body mass index (BMI) 32.0-32.9, adult: Secondary | ICD-10-CM | POA: Diagnosis not present

## 2020-10-22 DIAGNOSIS — S9032XA Contusion of left foot, initial encounter: Secondary | ICD-10-CM | POA: Diagnosis not present

## 2020-10-22 DIAGNOSIS — N184 Chronic kidney disease, stage 4 (severe): Secondary | ICD-10-CM | POA: Diagnosis not present

## 2020-10-22 DIAGNOSIS — W19XXXA Unspecified fall, initial encounter: Secondary | ICD-10-CM | POA: Diagnosis not present

## 2020-10-22 DIAGNOSIS — I951 Orthostatic hypotension: Secondary | ICD-10-CM | POA: Diagnosis not present

## 2020-10-22 LAB — CUP PACEART REMOTE DEVICE CHECK
Battery Remaining Longevity: 159 mo
Battery Voltage: 3.05 V
Brady Statistic AP VP Percent: 0.04 %
Brady Statistic AP VS Percent: 75.13 %
Brady Statistic AS VP Percent: 0.04 %
Brady Statistic AS VS Percent: 24.79 %
Brady Statistic RA Percent Paced: 75.17 %
Brady Statistic RV Percent Paced: 0.08 %
Date Time Interrogation Session: 20220918222743
Implantable Lead Implant Date: 20210322
Implantable Lead Implant Date: 20210322
Implantable Lead Location: 753859
Implantable Lead Location: 753860
Implantable Lead Model: 5076
Implantable Lead Model: 5076
Implantable Pulse Generator Implant Date: 20210322
Lead Channel Impedance Value: 304 Ohm
Lead Channel Impedance Value: 380 Ohm
Lead Channel Impedance Value: 437 Ohm
Lead Channel Impedance Value: 456 Ohm
Lead Channel Pacing Threshold Amplitude: 0.75 V
Lead Channel Pacing Threshold Amplitude: 0.75 V
Lead Channel Pacing Threshold Pulse Width: 0.4 ms
Lead Channel Pacing Threshold Pulse Width: 0.4 ms
Lead Channel Sensing Intrinsic Amplitude: 10 mV
Lead Channel Sensing Intrinsic Amplitude: 10 mV
Lead Channel Sensing Intrinsic Amplitude: 5.875 mV
Lead Channel Sensing Intrinsic Amplitude: 5.875 mV
Lead Channel Setting Pacing Amplitude: 1.5 V
Lead Channel Setting Pacing Amplitude: 2.5 V
Lead Channel Setting Pacing Pulse Width: 0.4 ms
Lead Channel Setting Sensing Sensitivity: 2 mV

## 2020-10-23 ENCOUNTER — Encounter: Payer: Self-pay | Admitting: Cardiology

## 2020-10-23 ENCOUNTER — Other Ambulatory Visit: Payer: Self-pay

## 2020-10-23 ENCOUNTER — Ambulatory Visit: Payer: Medicare Other | Admitting: Cardiology

## 2020-10-23 VITALS — BP 118/54 | HR 50 | Ht 63.0 in | Wt 187.4 lb

## 2020-10-23 DIAGNOSIS — I480 Paroxysmal atrial fibrillation: Secondary | ICD-10-CM

## 2020-10-23 DIAGNOSIS — I340 Nonrheumatic mitral (valve) insufficiency: Secondary | ICD-10-CM | POA: Diagnosis not present

## 2020-10-23 DIAGNOSIS — I129 Hypertensive chronic kidney disease with stage 1 through stage 4 chronic kidney disease, or unspecified chronic kidney disease: Secondary | ICD-10-CM

## 2020-10-23 DIAGNOSIS — I519 Heart disease, unspecified: Secondary | ICD-10-CM

## 2020-10-23 DIAGNOSIS — Z95818 Presence of other cardiac implants and grafts: Secondary | ICD-10-CM

## 2020-10-23 DIAGNOSIS — N184 Chronic kidney disease, stage 4 (severe): Secondary | ICD-10-CM

## 2020-10-23 NOTE — Progress Notes (Signed)
Cardiology Office Note:    Date:  10/23/2020   ID:  DENAI SAYSON, DOB [birth year], MRN [id number]  PCP:  Street, Sharon Mt, MD  Cardiologist:  Jenne Campus, MD    Referring MD: [address], Sharon Mt, *   No chief complaint on file. Doing fine but I fell down few days ago  History of Present Illness:    Amanda [name] is a 80 y.o. female  with past medical history significant for paroxysmal atrial fibrillation, coronary artery disease, status post PTCA and stenting to mid RCA in 2018, history of CVA, she is not anticoagulated because of frequent GI bleed however she does have watchman device, chronic kidney failure with creatinine of 1.7, diabetes, essential hypertension, obstructive sleep apnea.  In April she did have TEE cardioversion to sinus rhythm she stayed in sinus rhythm just for few days and then she flipped back to atrial fibrillation actually when I saw her last time she was in atrial flutter.  Also when she had TEE done she was identified to have significant severe tricuspid and mitral regurgitation.  Interestingly overall clinically she seems to be doing well, her exercise capacity is limited secondary to chronic hip problem.   He eventually end up being seen by EP team.  She was started on amiodarone loaded with the medication and underwent successful cardioversion of atrial fibrillation.  She is coming today to my office for follow-up. Overall she is doing very well it looks like she is maintaining sinus rhythm her EKG showed normal sinus rhythm today.  She described episode few days ago when she got up in the morning she went to the restroom when she was sitting there she did not feel well that she started walking and then she became very dizzy to the point that she slammed on the floor she ended up injuring her toe after that he went to the emergency room and she was found to have low blood pressure.  Denies have any chest pain tightness squeezing pressure burning chest  no swelling of lower extremities otherwise seems to be doing well  Past Medical History:  Diagnosis Date   Acute ischemic stroke (HCC)    Acute metabolic encephalopathy    Acute on chronic systolic (congestive) heart failure (HCC)    Acute renal insufficiency 09/02/2016   Anemia due to stage 4 chronic kidney disease ([place]) 04/04/2015   Atrial fibrillation (HCC)    Benign hypertension with CKD (chronic kidney disease) stage IV ([place]) 04/04/2015   Bradycardia 09/17/2014   Chest pain 06/23/2016   Overview:  Added automatically from request for surgery [id number]   Chronic anemia    Chronic kidney disease (CKD), stage III (moderate) ([place])    Coronary artery disease 10/12/2016   Non drug-eluting stent implanted in June 2018 to mid RCA   08/08/2016 10:37  Angiographic Findings  Cardiac Arteries and Lesion Findings LMCA: 0% and Normal. LAD: 0% and Normal. RCA:   Lesion on Mid RCA: Mid subsection.95% stenosis reduced to 0%. Pre procedure   TIMI II flow was noted. Post Procedure TIMI III flow was present. Poor run   off was present. The lesion was diagnosed as High Risk (C).    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 ([place]) 04/04/2015   Diabetes mellitus without complication ([place])    Diverticulosis    Dyslipidemia 09/17/2014   Gastroesophageal reflux    GI bleed 08/06/2016   Heart block AV complete ([place]) 09/05/2019   History of cardiac  monitoring 02/23/2018   monitor inserted   Hyperlipidemia    Hypertensive heart disease with heart failure (HCC)    Iron deficiency anemia 12/28/2017   LV dysfunction 09/02/2016   Myocardial infarction [facility])    NSTEMI (non-ST elevated myocardial infarction) ([place]) 08/06/2016   Obstructive sleep apnea 09/17/2014   Orthostatic hypotension 12/28/2017   OSA on CPAP    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   Postural dizziness with presyncope 09/23/2017   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/24/2017   Recurrent left pleural effusion 03/14/2015    Renal failure, chronic, stage 3 (moderate) ([place])    Syncope [id number]   Systolic heart failure ([place])    Thyroid disease    Vitamin D deficiency 04/04/2015    Past Surgical History:  Procedure Laterality Date   BUBBLE STUDY  05/16/2020   Procedure: BUBBLE STUDY;  Surgeon: Elouise Munroe, MD;  Location: [facility];  Service: Cardiovascular;;   CARDIAC CATHETERIZATION     CARDIOVERSION N/A 05/16/2020   Procedure: CARDIOVERSION;  Surgeon: Elouise Munroe, MD;  Location: [place];  Service: Cardiovascular;  Laterality: N/A;   CARDIOVERSION N/A 09/06/2020   Procedure: CARDIOVERSION;  Surgeon: Donato Heinz, MD;  Location[facility] ENDOSCOPY;  Service: Cardiovascular;  Laterality: N/A;   COLONOSCOPY  11/21/2014   Moderate predominantly sigmoid diverticulosis. Otherwise noraml collonscopy to TI.    CORONARY ANGIOPLASTY WITH STENT PLACEMENT  08/2016   EXCISIONAL HEMORRHOIDECTOMY     LEFT ATRIAL APPENDAGE OCCLUSION  11/2016   in [place] N/A 02/23/2018   Procedure: [place];  Surgeon: Constance Haw, MD;  Location: [place] CV LAB;  Service: Cardiovascular;  Laterality: N/A;   LOOP RECORDER REMOVAL N/A 04/24/2019   Procedure: LOOP RECORDER REMOVAL;  Surgeon: Constance Haw, MD;  Location: [place] CV LAB;  Service: Cardiovascular;  Laterality: N/A;   NOSE SURGERY     PACEMAKER IMPLANT N/A 04/24/2019   Procedure: PACEMAKER IMPLANT;  Surgeon: Constance Haw, MD;  Location: [place] CV LAB;  Service: Cardiovascular;  Laterality: N/A;   TEE WITHOUT CARDIOVERSION N/A 05/16/2020   Procedure: TRANSESOPHAGEAL ECHOCARDIOGRAM (TEE);  Surgeon: Elouise Munroe, MD;  Location: [facility] ENDOSCOPY;  Service: Cardiovascular;  Laterality: N/A;    Current Medications: No outpatient medications have been marked as taking for the 10/23/20 encounter (Appointment) with Park Liter, MD.   Current Facility-Administered Medications  for the 10/23/20 encounter (Appointment) with Park Liter, MD  Medication   0.9 %  sodium chloride infusion     Allergies:   Ciprofloxacin, Contrast media [iodinated diagnostic agents], Nsaids, and Statins   Social History   Socioeconomic History   Marital status: Married    Spouse name: Not on file   Number of children: 2   Years of education: Not on file   Highest education level: Not on file  Occupational History   Occupation: Retired   Tobacco Use   Smoking status: Former   Smokeless tobacco: Never  Scientific laboratory technician Use: Never used  Substance and Sexual Activity   Alcohol use: No   Drug use: No   Sexual activity: Not on file  Other Topics Concern   Not on file  Social History Narrative   Not on file   Social Determinants of Health   Financial Resource Strain: Not on file  Food Insecurity: Not on file  Transportation Needs: Not on file  Physical Activity: Not on file  Stress:  Not on file  Social Connections: Not on file     Family History: The patient's family history includes Breast cancer in her mother; Cancer in her maternal grandfather; Diabetes in her sister; Heart attack in her brother, maternal uncle, and sister; Hypertension in her father; Prostate cancer in her father; Stroke in her father. There is no history of Esophageal cancer. ROS:   Please see the history of present illness.    All 14 point review of systems negative except as described per history of present illness  EKGs/Labs/Other Studies Reviewed:      Recent Labs: 06/10/2020: NT-Pro BNP 2,349 08/28/2020: Platelets 243 09/06/2020: BUN 43; Creatinine, Ser 1.70; Hemoglobin 10.9; Potassium 4.8; Sodium 140  Recent Lipid Panel    Component Value Date/Time   CHOL 125 09/05/2019 0842   TRIG 162 (H) 09/05/2019 0842   HDL 59 09/05/2019 0842   CHOLHDL 2.1 09/05/2019 0842   LDLCALC 39 09/05/2019 0842    Physical Exam:    VS:  There were no vitals taken for this visit.    Wt Readings  from Last 3 Encounters:  08/26/20 183 lb (83 kg)  07/22/20 184 lb (83.5 kg)  07/17/20 184 lb (83.5 kg)     GEN:  Well nourished, well developed in no acute distress HEENT: Normal NECK: No JVD; No carotid bruits LYMPHATICS: No lymphadenopathy CARDIAC: RRR, no murmurs, no rubs, no gallops RESPIRATORY:  Clear to auscultation without rales, wheezing or rhonchi  ABDOMEN: Soft, non-tender, non-distended MUSCULOSKELETAL:  No edema; No deformity  SKIN: Warm and dry LOWER EXTREMITIES: no swelling NEUROLOGIC:  Alert and oriented x 3 PSYCHIATRIC:  Normal affect   ASSESSMENT:    1. Nonrheumatic mitral valve regurgitation   2. LV dysfunction   3. Benign hypertension with CKD (chronic kidney disease) stage IV ([place])   4. Presence of Watchman left atrial appendage closure device   5. Paroxysmal atrial fibrillation (HCC)    PLAN:    In order of problems listed above:  Nonrheumatic mitral regurgitation which seems to be severe.  She has been managed with diuretics as well as recently was converted to sinus rhythm will wait for another 2 months before repeating ultrasounds of her heart to recheck left ventricle ejection fraction LV dysfunction she is on appropriate medications that she can tolerate however I will discontinue her metoprolol because of episode of syncope that she suffered from. Dyslipidemia she is on pravastatin which I will continue apparently yesterday she had some blood work done by primary care physician we are calling her office to get report last LDL I see is 39 and HDL is 57 however this is from last year. Presence of watchman device.  She is still anticoagulated with Eliquis she said.  In my opinion she need to be anticoagulated for about 6 weeks after procedure and then we will discontinue however I will send a message to Dr. Curt Bears with a question regarding that issue.   Medication Adjustments/Labs and Tests Ordered: Current medicines are reviewed at length with the  patient today.  Concerns regarding medicines are outlined above.  No orders of the defined types were placed in this encounter.  Medication changes: No orders of the defined types were placed in this encounter.   Signed, Park Liter, MD, [facility] 10/23/2020 3:19 PM    [place]

## 2020-10-23 NOTE — Addendum Note (Signed)
Addended by: Orvan July on: 10/23/2020 04:10 PM   Modules accepted: Orders

## 2020-10-23 NOTE — Patient Instructions (Signed)
Medication Instructions:  Your physician recommends that you continue on your current medications as directed. Please refer to the Current Medication list given to you today.  *If you need a refill on your cardiac medications before your next appointment, please call your pharmacy*   Lab Work: None.  If you have labs (blood work) drawn today and your tests are completely normal, you will receive your results only by: . MyChart Message (if you have MyChart) OR . A paper copy in the mail If you have any lab test that is abnormal or we need to change your treatment, we will call you to review the results.   Testing/Procedures: Your physician has requested that you have an echocardiogram. Echocardiography is a painless test that uses sound waves to create images of your heart. It provides your doctor with information about the size and shape of your heart and how well your heart's chambers and valves are working. This procedure takes approximately one hour. There are no restrictions for this procedure.     Follow-Up: At [facility], you and your health needs are our priority.  As part of our continuing mission to provide you with exceptional heart care, we have created designated Provider Care Teams.  These Care Teams include your primary Cardiologist (physician) and Advanced Practice Providers (APPs -  Physician Assistants and Nurse Practitioners) who all work together to provide you with the care you need, when you need it.  We recommend signing up for the patient portal called "MyChart".  Sign up information is provided on this After Visit Summary.  MyChart is used to connect with patients for Virtual Visits (Telemedicine).  Patients are able to view lab/test results, encounter notes, upcoming appointments, etc.  Non-urgent messages can be sent to your provider as well.   To learn more about what you can do with MyChart, go to [email or web address].    Your next appointment:   3  month(s)  The format for your next appointment:   In Person  Provider:   Robert Krasowski, MD   Other Instructions   

## 2020-10-25 ENCOUNTER — Telehealth: Payer: Self-pay

## 2020-10-25 NOTE — Progress Notes (Signed)
Remote pacemaker transmission.   

## 2020-10-25 NOTE — Telephone Encounter (Signed)
-----   Message from Park Liter, MD sent at 10/24/2020 10:02 PM EDT ----- This is already more than a month after her last cardioversion therefore we can stop her Eliquis ----- Message ----- From: Constance Haw, MD Sent: 10/23/2020   4:38 PM EDT To: Park Liter, MD  Correct, one month after DCCV would be ok to stop anticoagulation. With her history of bleeding and a watchman in place would probably hold the medication. ----- Message ----- From: Park Liter, MD Sent: 10/23/2020   4:01 PM EDT To: Constance Haw, MD  Hi Will I did see this patient today my office for evaluation.  I also remember she does have paroxysmal atrial fibrillation initially we converted her to sinus rhythm without medications and she did quite well for few weeks and then went back to atrial fibrillation after that she was loaded with amiodarone and recently at the beginning of last month she had electrical cardioversion to sinus rhythm.  She comes today to my office she is maintaining sinus rhythm.  The issue is her Eliquis.  She is to be taking Eliquis however had multiple GI bleed that led to implantation of Edie device.  I will check her CBC today.  She has been already more than a month after cardioversion on Eliquis.  Do think it will be reasonable to discontinue that medication. Thank you for your expertise Herbie [place]

## 2020-10-25 NOTE — Telephone Encounter (Signed)
Spoke with patient regarding recommendation.  Patient verbalizes understanding and is agreeable to plan of care. Advised patient to call back with any issues or concerns.   

## 2020-10-28 DIAGNOSIS — I129 Hypertensive chronic kidney disease with stage 1 through stage 4 chronic kidney disease, or unspecified chronic kidney disease: Secondary | ICD-10-CM | POA: Diagnosis not present

## 2020-10-28 DIAGNOSIS — E1122 Type 2 diabetes mellitus with diabetic chronic kidney disease: Secondary | ICD-10-CM | POA: Diagnosis not present

## 2020-10-28 DIAGNOSIS — N184 Chronic kidney disease, stage 4 (severe): Secondary | ICD-10-CM | POA: Diagnosis not present

## 2020-10-28 DIAGNOSIS — E889 Metabolic disorder, unspecified: Secondary | ICD-10-CM | POA: Diagnosis not present

## 2020-11-04 ENCOUNTER — Other Ambulatory Visit: Payer: Self-pay | Admitting: Cardiology

## 2020-11-04 ENCOUNTER — Encounter: Payer: Self-pay | Admitting: Cardiology

## 2020-11-04 ENCOUNTER — Other Ambulatory Visit: Payer: Self-pay

## 2020-11-04 ENCOUNTER — Ambulatory Visit (INDEPENDENT_AMBULATORY_CARE_PROVIDER_SITE_OTHER): Payer: Medicare Other | Admitting: Cardiology

## 2020-11-04 VITALS — BP 160/80 | HR 68 | Ht 63.0 in | Wt 182.0 lb

## 2020-11-04 DIAGNOSIS — I442 Atrioventricular block, complete: Secondary | ICD-10-CM

## 2020-11-04 DIAGNOSIS — R55 Syncope and collapse: Secondary | ICD-10-CM | POA: Diagnosis not present

## 2020-11-04 DIAGNOSIS — I4819 Other persistent atrial fibrillation: Secondary | ICD-10-CM

## 2020-11-04 DIAGNOSIS — G4733 Obstructive sleep apnea (adult) (pediatric): Secondary | ICD-10-CM | POA: Diagnosis not present

## 2020-11-04 DIAGNOSIS — I10 Essential (primary) hypertension: Secondary | ICD-10-CM | POA: Diagnosis not present

## 2020-11-04 NOTE — Telephone Encounter (Signed)
Prescription refill request for Eliquis received. Indication:Afib  Last office visit: 11/04/20 ([place])  Scr: 2.28 (08/28/20) Age: 80 Weight: 82.6kg  Appropriate dose and refill sent to requested pharmacy.

## 2020-11-04 NOTE — Patient Instructions (Signed)
Medication Instructions:  NO CHANGES *If you need a refill on your cardiac medications before your next appointment, please call your pharmacy*   Lab Work: NONE If you have labs (blood work) drawn today and your tests are completely normal, you will receive your results only by: [place] (if you have MyChart) OR A paper copy in the mail If you have any lab test that is abnormal or we need to change your treatment, we will call you to review the results.   Testing/Procedures: NONE   Follow-Up: At [facility], you and your health needs are our priority.  As part of our continuing mission to provide you with exceptional heart care, we have created designated Provider Care Teams.  These Care Teams include your primary Cardiologist (physician) and Advanced Practice Providers (APPs -  Physician Assistants and Nurse Practitioners) who all work together to provide you with the care you need, when you need it.  We recommend signing up for the patient portal called "MyChart".  Sign up information is provided on this After Visit Summary.  MyChart is used to connect with patients for Virtual Visits (Telemedicine).  Patients are able to view lab/test results, encounter notes, upcoming appointments, etc.  Non-urgent messages can be sent to your provider as well.   To learn more about what you can do with MyChart, go to [email or web address].    Your next appointment:   72months)  The format for your next appointment:   In Person  Provider:   DR CAMNITZ   Other Instructions NONE

## 2020-11-04 NOTE — Progress Notes (Signed)
Electrophysiology Office Note   Date:  11/04/2020   ID:  Amanda [name], DOB 06-14-1940, MRN [id number]  PCP:  Street, Sharon Mt, MD  Cardiologist: Agustin Cree Primary Electrophysiologist:  Glendene Wyer Meredith Leeds, MD    Chief Complaint  Patient presents with   Follow-up      History of Present Illness: Amanda [name] is a 80 y.o. female who is being seen today for the evaluation of atrial fibrillation, syncope at the request of Jenne Campus. Presenting today for electrophysiology evaluation.    She has a history significant for ischemic stroke, atrial fibrillation, CKD stage III, diabetes, hypertension, OSA on CPAP.  She has a watchman implanted due to recurrent GI bleeding.  She had a episode of syncope and is now status post Medtronic dual-chamber pacemaker implanted 04/24/2019 for long pauses and intermittent complete heart block.  She has been started on amiodarone for atrial fibrillation and had a cardioversion 09/06/2020.  Today, denies symptoms of palpitations, chest pain, shortness of breath, orthopnea, PND, lower extremity edema, claudication, dizziness, presyncope, syncope, bleeding, or neurologic sequela. The patient is tolerating medications without difficulties.  She currently feels well.  She is relieved to be back in normal rhythm.  She has had no episodes of atrial fibrillation since her cardioversion.  Unfortunately approximately 10 days ago she had a fall.  She was walking from her bedroom to her bathroom and fell on the tile floor.  She felt she broke her toe and when she had it x-rayed, her blood pressure was in the 90s when she was standing.  Her metoprolol was stopped.  Since then, she has been checking her blood pressures which are generally around 130/80 at home.  Past Medical History:  Diagnosis Date   Acute ischemic stroke ([place])    Acute metabolic encephalopathy    Acute on chronic systolic (congestive) heart failure (HCC)    Acute renal insufficiency  09/02/2016   Anemia due to stage 4 chronic kidney disease ([place]) 04/04/2015   Atrial fibrillation (HCC)    Benign hypertension with CKD (chronic kidney disease) stage IV ([place]) 04/04/2015   Bradycardia 09/17/2014   Chest pain 06/23/2016   Overview:  Added automatically from request for surgery [id number]   Chronic anemia    Chronic kidney disease (CKD), stage III (moderate) ([place])    Coronary artery disease 10/12/2016   Non drug-eluting stent implanted in June 2018 to mid RCA   08/08/2016 10:37  Angiographic Findings  Cardiac Arteries and Lesion Findings LMCA: 0% and Normal. LAD: 0% and Normal. RCA:   Lesion on Mid RCA: Mid subsection.95% stenosis reduced to 0%. Pre procedure   TIMI II flow was noted. Post Procedure TIMI III flow was present. Poor run   off was present. The lesion was diagnosed as High Risk (C).    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 ([place]) 04/04/2015   Diabetes mellitus without complication ([place])    Diverticulosis    Dyslipidemia 09/17/2014   Gastroesophageal reflux    GI bleed 08/06/2016   Heart block AV complete ([place]) 09/05/2019   History of cardiac monitoring 02/23/2018   monitor inserted   Hyperlipidemia    Hypertensive heart disease with heart failure (HCC)    Iron deficiency anemia 12/28/2017   LV dysfunction 09/02/2016   Myocardial infarction [facility])    NSTEMI (non-ST elevated myocardial infarction) ([place]) 08/06/2016   Obstructive sleep apnea 09/17/2014   Orthostatic hypotension 12/28/2017   OSA on CPAP    Pacemaker Medtronic  device 09/05/2019   Postural dizziness with presyncope 09/23/2017   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/24/2017   Recurrent left pleural effusion 03/14/2015   Renal failure, chronic, stage 3 (moderate) (HCC)    Syncope [id number]   Systolic heart failure ([place])    Thyroid disease    Vitamin D deficiency 04/04/2015   Past Surgical History:  Procedure Laterality Date   BUBBLE STUDY  05/16/2020   Procedure: BUBBLE STUDY;   Surgeon: Elouise Munroe, MD;  Location: [place];  Service: Cardiovascular;;   CARDIAC CATHETERIZATION     CARDIOVERSION N/A 05/16/2020   Procedure: CARDIOVERSION;  Surgeon: Elouise Munroe, MD;  Location: [facility] ENDOSCOPY;  Service: Cardiovascular;  Laterality: N/A;   CARDIOVERSION N/A 09/06/2020   Procedure: CARDIOVERSION;  Surgeon: Donato Heinz, MD;  Location: [facility] ENDOSCOPY;  Service: Cardiovascular;  Laterality: N/A;   COLONOSCOPY  11/21/2014   Moderate predominantly sigmoid diverticulosis. Otherwise noraml collonscopy to TI.    CORONARY ANGIOPLASTY WITH STENT PLACEMENT  08/2016   EXCISIONAL HEMORRHOIDECTOMY     LEFT ATRIAL APPENDAGE OCCLUSION  11/2016   in [place] N/A 02/23/2018   Procedure: [place];  Surgeon: Constance Haw, MD;  Location: [place] CV LAB;  Service: Cardiovascular;  Laterality: N/A;   LOOP RECORDER REMOVAL N/A 04/24/2019   Procedure: LOOP RECORDER REMOVAL;  Surgeon: Constance Haw, MD;  Location: [place] CV LAB;  Service: Cardiovascular;  Laterality: N/A;   NOSE SURGERY     PACEMAKER IMPLANT N/A 04/24/2019   Procedure: PACEMAKER IMPLANT;  Surgeon: Constance Haw, MD;  Location: [place] CV LAB;  Service: Cardiovascular;  Laterality: N/A;   TEE WITHOUT CARDIOVERSION N/A 05/16/2020   Procedure: TRANSESOPHAGEAL ECHOCARDIOGRAM (TEE);  Surgeon: Elouise Munroe, MD;  Location: [facility] ENDOSCOPY;  Service: Cardiovascular;  Laterality: N/A;     Current Outpatient Medications  Medication Sig Dispense Refill   albuterol (VENTOLIN HFA) 108 (90 Base) MCG/ACT inhaler Inhale 2 puffs into the lungs every 6 (six) hours as needed for wheezing or shortness of breath.     amiodarone (PACERONE) 200 MG tablet Take 200 mg by mouth daily.     BIOTIN PO Take 600 mcg by mouth 2 (two) times daily.     carboxymethylcellul-glycerin (REFRESH RELIEVA) 0.5-0.9 % ophthalmic solution Place 1 drop into both eyes  daily as needed for dry eyes.     Cholecalciferol (VITAMIN D3) 50 MCG (2000 UT) TABS Take 2,000 Units by mouth daily at 12 noon.     Coenzyme Q10 100 MG TABS Take 100 mg by mouth daily at 12 noon.      estradiol (ESTRACE) 0.1 MG/GM vaginal cream Place 1 Applicatorful vaginally every Monday, Wednesday, and Friday.      Evolocumab (REPATHA) 140 MG/ML SOSY Inject 140 mg into the skin every 14 (fourteen) days.     ezetimibe (ZETIA) 10 MG tablet TAKE 1 TABLET BY MOUTH EVERY EVENING. 90 tablet 2   famotidine (PEPCID) 40 MG tablet Take 40 mg by mouth daily.     febuxostat (ULORIC) 40 MG tablet Take 40 mg by mouth at bedtime.     ferrous sulfate 325 (65 FE) MG EC tablet Take 325 mg by mouth 2 (two) times daily with breakfast and lunch.     folic acid (FOLVITE) Q000111Q MCG tablet Take 800 mcg by mouth daily at 12 noon.     furosemide (LASIX) 20 MG tablet Take 40 mg by mouth 2 (  two) times daily with breakfast and lunch.     Glucosamine-Chondroitin (OSTEO BI-FLEX REGULAR STRENGTH PO) Take 1 tablet by mouth daily at 12 noon. Unknown strength     isosorbide mononitrate (IMDUR) 30 MG 24 hr tablet TAKE 1 TABLET BY MOUTH DAILY 90 tablet 3   Lactobacillus (FLORAJEN ACIDOPHILUS) CAPS Take 1 capsule by mouth daily at 12 noon.     levothyroxine (SYNTHROID, LEVOTHROID) 50 MCG tablet Take 50 mcg by mouth daily before breakfast.     Multiple Vitamins-Minerals (PRESERVISION AREDS 2) CAPS Take 1 capsule by mouth 2 (two) times daily. Unknow strength     nitroGLYCERIN (NITROSTAT) 0.4 MG SL tablet Place 0.4 mg under the tongue every 5 (five) minutes as needed for chest pain.     omega-3 acid ethyl esters (LOVAZA) 1 g capsule Take 2 g by mouth daily at 12 noon.     pantoprazole (PROTONIX) 40 MG tablet TAKE 1 TABLET BY MOUTH DAILY 90 tablet 4   polyethylene glycol powder (GLYCOLAX/MIRALAX) powder Take 17 g by mouth every other day. Alternating between miralax and benefiber     pravastatin (PRAVACHOL) 40 MG tablet Take 40 mg by  mouth at bedtime.     ranolazine (RANEXA) 1000 MG SR tablet TAKE 1 TABLET BY MOUTH 2 TIMES DAILY. 180 tablet 2   traMADol (ULTRAM) 50 MG tablet Take 50 mg by mouth every 12 (twelve) hours as needed for severe pain.     vitamin B-12 (CYANOCOBALAMIN) 1000 MCG tablet Take 1,000 mcg by mouth daily at 12 noon.     zolpidem (AMBIEN) 5 MG tablet Take 5 mg by mouth at bedtime as needed for sleep.     Current Facility-Administered Medications  Medication Dose Route Frequency Provider Last Rate Last Admin   0.9 %  sodium chloride infusion  500 mL Intravenous Once Jackquline Denmark, MD        Allergies:   Ciprofloxacin, Contrast media [iodinated diagnostic agents], Nsaids, and Statins   Social History:  The patient  reports that she has quit smoking. She has never used smokeless tobacco. She reports that she does not drink alcohol and does not use drugs.   Family History:  The patient's family history includes Breast cancer in her mother; Cancer in her maternal grandfather; Diabetes in her sister; Heart attack in her brother, maternal uncle, and sister; Hypertension in her father; Prostate cancer in her father; Stroke in her father.   ROS:  Please see the history of present illness.   Otherwise, review of systems is positive for none.   All other systems are reviewed and negative.   PHYSICAL EXAM: VS:  BP (!) 160/80 (BP Location: Right Arm, Patient Position: Sitting, Cuff Size: Normal)   Pulse 68   Ht '5\' 3"'$  (1.6 m)   Wt 182 lb (82.6 kg)   SpO2 99%   BMI 32.24 kg/m  , BMI Body mass index is 32.24 kg/m. GEN: Well nourished, well developed, in no acute distress  HEENT: normal  Neck: no JVD, carotid bruits, or masses Cardiac: RRR; no murmurs, rubs, or gallops,no edema  Respiratory:  clear to auscultation bilaterally, normal work of breathing GI: soft, nontender, nondistended, + BS MS: no deformity or atrophy  Skin: warm and dry, device site well healed Neuro:  Strength and sensation are  intact Psych: euthymic mood, full affect  EKG:  EKG is not ordered today. Personal review of the ekg ordered 10/23/20 shows atrial paced, first-degree AV block  Personal review of the device  interrogation today. Results in [place]: 06/10/2020: NT-Pro BNP 2,349 08/28/2020: Platelets 243 09/06/2020: BUN 43; Creatinine, Ser 1.70; Hemoglobin 10.9; Potassium 4.8; Sodium 140    Lipid Panel     Component Value Date/Time   CHOL 125 09/05/2019 0842   TRIG 162 (H) 09/05/2019 0842   HDL 59 09/05/2019 0842   CHOLHDL 2.1 09/05/2019 0842   LDLCALC 39 09/05/2019 0842     Wt Readings from Last 3 Encounters:  11/04/20 182 lb (82.6 kg)  10/23/20 187 lb 6.4 oz (85 kg)  08/26/20 183 lb (83 kg)      Other studies Reviewed: Additional studies/ records that were reviewed today include: TTE 12/22/2017 Review of the above records today demonstrates:  - Left ventricle: The cavity size was normal. Wall thickness was   increased in a pattern of moderate LVH. Systolic function was   normal. The estimated ejection fraction was in the range of 60%   to 65%. Wall motion was normal; there were no regional wall   motion abnormalities. Features are consistent with a pseudonormal   left ventricular filling pattern, with concomitant abnormal   relaxation and increased filling pressure (grade 2 diastolic   dysfunction). - Aortic valve: There was mild stenosis. Valve area (VTI): 1.57   cm^2. Valve area (Vmax): 1.45 cm^2. Valve area (Vmean): 1.57   cm^2. - Mitral valve: There was mild regurgitation. Valve area by   pressure half-time: 2.34 cm^2. - Left atrium: The atrium was moderately dilated.   ASSESSMENT AND PLAN:  1.  Persistent atrial fibrillation: Status post watchman and thus not anticoagulated.  CHA2DS2-VASc of 7.  Has been started on amiodarone 200 mg daily.  High risk medication monitoring via ECG and labs.  She is now status post cardioversion 09/06/2020.  Fortunately she has had no  episodes of atrial fibrillation.  We Reis Goga continue amiodarone at the current dose.  She Halen Antenucci need a TSH and LFTs at her next visit.  2.  Obstructive sleep apnea: CPAP compliance encouraged  3.  Hypertension: Elevated today.  Usually well controlled at home.  No changes.  4.  Syncope with complete heart block: Status post Medtronic dual-chamber pacemaker implanted 04/24/2019.  Device functioning appropriately.  No changes.   Current medicines are reviewed at length with the patient today.   The patient does not have concerns regarding her medicines.  The following changes were made today: None  Labs/ tests ordered today include:  No orders of the defined types were placed in this encounter.    Disposition:   FU with Delia Sitar 6 months  Signed, Saath[place] Every Meredith Leeds, MD  11/04/2020 11:12 AM     [facility] [address] [place] [address] [phone] (office) [phone] (fax)

## 2020-11-05 ENCOUNTER — Other Ambulatory Visit: Payer: Medicare Other

## 2020-11-22 DIAGNOSIS — N3091 Cystitis, unspecified with hematuria: Secondary | ICD-10-CM | POA: Diagnosis not present

## 2020-11-22 DIAGNOSIS — R300 Dysuria: Secondary | ICD-10-CM | POA: Diagnosis not present

## 2020-11-26 DIAGNOSIS — H353131 Nonexudative age-related macular degeneration, bilateral, early dry stage: Secondary | ICD-10-CM | POA: Diagnosis not present

## 2020-11-26 DIAGNOSIS — Z23 Encounter for immunization: Secondary | ICD-10-CM | POA: Diagnosis not present

## 2020-11-26 DIAGNOSIS — H18593 Other hereditary corneal dystrophies, bilateral: Secondary | ICD-10-CM | POA: Diagnosis not present

## 2020-11-26 DIAGNOSIS — H18413 Arcus senilis, bilateral: Secondary | ICD-10-CM | POA: Diagnosis not present

## 2020-11-26 DIAGNOSIS — Z961 Presence of intraocular lens: Secondary | ICD-10-CM | POA: Diagnosis not present

## 2020-11-28 DIAGNOSIS — N184 Chronic kidney disease, stage 4 (severe): Secondary | ICD-10-CM | POA: Diagnosis not present

## 2020-11-28 DIAGNOSIS — G250 Essential tremor: Secondary | ICD-10-CM | POA: Diagnosis not present

## 2020-11-28 DIAGNOSIS — N2581 Secondary hyperparathyroidism of renal origin: Secondary | ICD-10-CM | POA: Diagnosis not present

## 2020-11-28 DIAGNOSIS — N3091 Cystitis, unspecified with hematuria: Secondary | ICD-10-CM | POA: Diagnosis not present

## 2020-12-17 DIAGNOSIS — N184 Chronic kidney disease, stage 4 (severe): Secondary | ICD-10-CM | POA: Diagnosis not present

## 2020-12-17 DIAGNOSIS — E1122 Type 2 diabetes mellitus with diabetic chronic kidney disease: Secondary | ICD-10-CM | POA: Diagnosis not present

## 2020-12-17 DIAGNOSIS — R635 Abnormal weight gain: Secondary | ICD-10-CM | POA: Diagnosis not present

## 2020-12-17 DIAGNOSIS — I129 Hypertensive chronic kidney disease with stage 1 through stage 4 chronic kidney disease, or unspecified chronic kidney disease: Secondary | ICD-10-CM | POA: Diagnosis not present

## 2020-12-20 ENCOUNTER — Telehealth: Payer: Self-pay

## 2020-12-20 NOTE — Telephone Encounter (Signed)
[facility] sent a refill request stating the patient says her Lovaza has increased. I spoke to the patient after reviewing her chart, I informed her on the cardiology standpoint, we haven't changed the following med. She stated one of her doctors increase to 2g. I advise to check with PCP and if so request a refill.

## 2020-12-23 ENCOUNTER — Ambulatory Visit (INDEPENDENT_AMBULATORY_CARE_PROVIDER_SITE_OTHER): Payer: Medicare Other

## 2020-12-23 ENCOUNTER — Other Ambulatory Visit: Payer: Self-pay

## 2020-12-23 DIAGNOSIS — I340 Nonrheumatic mitral (valve) insufficiency: Secondary | ICD-10-CM

## 2020-12-23 LAB — ECHOCARDIOGRAM COMPLETE
AR max vel: 1.63 cm2
AV Area VTI: 1.54 cm2
AV Area mean vel: 1.51 cm2
AV Mean grad: 8.3 mmHg
AV Peak grad: 16.1 mmHg
Ao pk vel: 2 m/s
Area-P 1/2: 2.29 cm2
MV M vel: 6.44 m/s
MV Peak grad: 165.9 mmHg
Radius: 0.7 cm
S' Lateral: 2.9 cm

## 2021-01-07 ENCOUNTER — Other Ambulatory Visit: Payer: Self-pay | Admitting: Cardiology

## 2021-01-15 ENCOUNTER — Other Ambulatory Visit: Payer: Self-pay

## 2021-01-16 ENCOUNTER — Telehealth: Payer: Self-pay

## 2021-01-16 NOTE — Telephone Encounter (Signed)
Patient called in stating that her monitor is not working ever since she lost power. We have called tech support to help and they stated she needs a new monitor. Tech support has offered her to get the app instead and has walked her through how to download it and get it started.

## 2021-01-20 ENCOUNTER — Ambulatory Visit: Payer: Medicare Other | Admitting: Cardiology

## 2021-01-20 ENCOUNTER — Ambulatory Visit (INDEPENDENT_AMBULATORY_CARE_PROVIDER_SITE_OTHER): Payer: Medicare Other

## 2021-01-20 ENCOUNTER — Other Ambulatory Visit: Payer: Self-pay

## 2021-01-20 VITALS — BP 138/74 | HR 80 | Ht 63.0 in | Wt 186.0 lb

## 2021-01-20 DIAGNOSIS — I442 Atrioventricular block, complete: Secondary | ICD-10-CM

## 2021-01-20 DIAGNOSIS — G4733 Obstructive sleep apnea (adult) (pediatric): Secondary | ICD-10-CM

## 2021-01-20 DIAGNOSIS — I129 Hypertensive chronic kidney disease with stage 1 through stage 4 chronic kidney disease, or unspecified chronic kidney disease: Secondary | ICD-10-CM

## 2021-01-20 DIAGNOSIS — I480 Paroxysmal atrial fibrillation: Secondary | ICD-10-CM | POA: Diagnosis not present

## 2021-01-20 DIAGNOSIS — I340 Nonrheumatic mitral (valve) insufficiency: Secondary | ICD-10-CM

## 2021-01-20 DIAGNOSIS — N184 Chronic kidney disease, stage 4 (severe): Secondary | ICD-10-CM

## 2021-01-20 DIAGNOSIS — I519 Heart disease, unspecified: Secondary | ICD-10-CM | POA: Diagnosis not present

## 2021-01-20 DIAGNOSIS — Z9989 Dependence on other enabling machines and devices: Secondary | ICD-10-CM

## 2021-01-20 LAB — CUP PACEART REMOTE DEVICE CHECK
Battery Remaining Longevity: 157 mo
Battery Voltage: 3.04 V
Brady Statistic AP VP Percent: 0 %
Brady Statistic AP VS Percent: 2.98 %
Brady Statistic AS VP Percent: 0.04 %
Brady Statistic AS VS Percent: 96.98 %
Brady Statistic RA Percent Paced: 2.96 %
Brady Statistic RV Percent Paced: 0.05 %
Date Time Interrogation Session: 20221218230306
Implantable Lead Implant Date: 20210322
Implantable Lead Implant Date: 20210322
Implantable Lead Location: 753859
Implantable Lead Location: 753860
Implantable Lead Model: 5076
Implantable Lead Model: 5076
Implantable Pulse Generator Implant Date: 20210322
Lead Channel Impedance Value: 323 Ohm
Lead Channel Impedance Value: 380 Ohm
Lead Channel Impedance Value: 437 Ohm
Lead Channel Impedance Value: 494 Ohm
Lead Channel Pacing Threshold Amplitude: 0.875 V
Lead Channel Pacing Threshold Amplitude: 0.875 V
Lead Channel Pacing Threshold Pulse Width: 0.4 ms
Lead Channel Pacing Threshold Pulse Width: 0.4 ms
Lead Channel Sensing Intrinsic Amplitude: 10.125 mV
Lead Channel Sensing Intrinsic Amplitude: 10.125 mV
Lead Channel Sensing Intrinsic Amplitude: 5.375 mV
Lead Channel Sensing Intrinsic Amplitude: 5.375 mV
Lead Channel Setting Pacing Amplitude: 1.75 V
Lead Channel Setting Pacing Amplitude: 2.5 V
Lead Channel Setting Pacing Pulse Width: 0.4 ms
Lead Channel Setting Sensing Sensitivity: 2 mV

## 2021-01-20 NOTE — Patient Instructions (Signed)
Medication Instructions:  Your physician recommends that you continue on your current medications as directed. Please refer to the Current Medication list given to you today.  *If you need a refill on your cardiac medications before your next appointment, please call your pharmacy*   Lab Work: NONE If you have labs (blood work) drawn today and your tests are completely normal, you will receive your results only by: [place] (if you have MyChart) OR A paper copy in the mail If you have any lab test that is abnormal or we need to change your treatment, we will call you to review the results.   Testing/Procedures: NONE   Follow-Up: At [facility], you and your health needs are our priority.  As part of our continuing mission to provide you with exceptional heart care, we have created designated Provider Care Teams.  These Care Teams include your primary Cardiologist (physician) and Advanced Practice Providers (APPs -  Physician Assistants and Nurse Practitioners) who all work together to provide you with the care you need, when you need it.  We recommend signing up for the patient portal called "MyChart".  Sign up information is provided on this After Visit Summary.  MyChart is used to connect with patients for Virtual Visits (Telemedicine).  Patients are able to view lab/test results, encounter notes, upcoming appointments, etc.  Non-urgent messages can be sent to your provider as well.   To learn more about what you can do with MyChart, go to [email or web address].    Your next appointment:   5 month(s)  The format for your next appointment:   In Person  Provider:   Jenne Campus, MD    Other Instructions

## 2021-01-20 NOTE — Progress Notes (Signed)
Cardiology Office Note:    Date:  01/20/2021   ID:  Amanda [name], DOB 1940/08/09, MRN [id number]  PCP:  Street, Sharon Mt, MD  Cardiologist:  Jenne Campus, MD    Referring MD: Street, Sharon Mt, *   Chief Complaint  Patient presents with   Follow-up  I am doing fine  History of Present Illness:    Amanda [name] is a 80 y.o. female  with past medical history significant for paroxysmal atrial fibrillation, coronary artery disease, status post PTCA and stenting to mid RCA in 2018, history of CVA, she is not anticoagulated because of frequent GI bleed however she does have watchman device, chronic kidney failure with creatinine of 1.7, diabetes, essential hypertension, obstructive sleep apnea.  In April she did have TEE cardioversion to sinus rhythm she stayed in sinus rhythm just for few days and then she flipped back to atrial fibrillation actually when I saw her last time she was in atrial flutter.  Also when she had TEE done she was identified to have significant severe tricuspid and mitral regurgitation.  Interestingly overall clinically she seems to be doing well, her exercise capacity is limited secondary to chronic hip problem.   He eventually end up being seen by EP team.  She was started on amiodarone loaded with the medication and underwent successful cardioversion of atrial fibrillation She is coming today to my office for follow-up overall she seems to be doing well.  She denies have any chest pain tightness squeezing pressure burning chest no palpitations no dizziness biggest complaint is hip burning  Past Medical History:  Diagnosis Date   Acute ischemic stroke ([place])    Acute metabolic encephalopathy    Acute on chronic systolic (congestive) heart failure (HCC)    Acute renal insufficiency 09/02/2016   Anemia due to stage 4 chronic kidney disease ([place]) 04/04/2015   Atrial fibrillation (HCC)    Benign hypertension with CKD (chronic kidney disease) stage IV ([place])  04/04/2015   Bradycardia 09/17/2014   Chest pain 06/23/2016   Overview:  Added automatically from request for surgery [id number]   Chronic anemia    Chronic kidney disease (CKD), stage III (moderate) ([place])    Coronary artery disease 10/12/2016   Non drug-eluting stent implanted in June 2018 to mid RCA   08/08/2016 10:37  Angiographic Findings  Cardiac Arteries and Lesion Findings LMCA: 0% and Normal. LAD: 0% and Normal. RCA:   Lesion on Mid RCA: Mid subsection.95% stenosis reduced to 0%. Pre procedure   TIMI II flow was noted. Post Procedure TIMI III flow was present. Poor run   off was present. The lesion was diagnosed as High Risk (C).    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 ([place]) 04/04/2015   Diabetes mellitus without complication ([place])    Diverticulosis    Dyslipidemia 09/17/2014   Gastroesophageal reflux    GI bleed 08/06/2016   Heart block AV complete ([place]) 09/05/2019   History of cardiac monitoring 02/23/2018   monitor inserted   Hyperlipidemia    Hypertensive heart disease with heart failure (HCC)    Iron deficiency anemia 12/28/2017   LV dysfunction 09/02/2016   Myocardial infarction [facility])    NSTEMI (non-ST elevated myocardial infarction) ([place]) 08/06/2016   Obstructive sleep apnea 09/17/2014   Orthostatic hypotension 12/28/2017   OSA on CPAP    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   Postural dizziness with presyncope 09/23/2017   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/24/2017  Recurrent left pleural effusion 03/14/2015   Renal failure, chronic, stage 3 (moderate) (HCC)    Syncope 11/04/7251   Systolic heart failure ([place])    Thyroid disease    Vitamin D deficiency 04/04/2015    Past Surgical History:  Procedure Laterality Date   BUBBLE STUDY  05/16/2020   Procedure: BUBBLE STUDY;  Surgeon: Elouise Munroe, MD;  Location: [facility];  Service: Cardiovascular;;   CARDIAC CATHETERIZATION     CARDIOVERSION N/A 05/16/2020   Procedure:  CARDIOVERSION;  Surgeon: Elouise Munroe, MD;  Location: [facility] ENDOSCOPY;  Service: Cardiovascular;  Laterality: N/A;   CARDIOVERSION N/A 09/06/2020   Procedure: CARDIOVERSION;  Surgeon: Donato Heinz, MD;  Location: [facility] ENDOSCOPY;  Service: Cardiovascular;  Laterality: N/A;   COLONOSCOPY  11/21/2014   Moderate predominantly sigmoid diverticulosis. Otherwise noraml collonscopy to TI.    CORONARY ANGIOPLASTY WITH STENT PLACEMENT  08/2016   EXCISIONAL HEMORRHOIDECTOMY     LEFT ATRIAL APPENDAGE OCCLUSION  11/2016   in [place] N/A 02/23/2018   Procedure: [place];  Surgeon: Constance Haw, MD;  Location: [place] CV LAB;  Service: Cardiovascular;  Laterality: N/A;   LOOP RECORDER REMOVAL N/A 04/24/2019   Procedure: LOOP RECORDER REMOVAL;  Surgeon: Constance Haw, MD;  Location: [place] CV LAB;  Service: Cardiovascular;  Laterality: N/A;   NOSE SURGERY     PACEMAKER IMPLANT N/A 04/24/2019   Procedure: PACEMAKER IMPLANT;  Surgeon: Constance Haw, MD;  Location: [place] CV LAB;  Service: Cardiovascular;  Laterality: N/A;   TEE WITHOUT CARDIOVERSION N/A 05/16/2020   Procedure: TRANSESOPHAGEAL ECHOCARDIOGRAM (TEE);  Surgeon: Elouise Munroe, MD;  Location: [facility] ENDOSCOPY;  Service: Cardiovascular;  Laterality: N/A;    Current Medications: Current Meds  Medication Sig   albuterol (VENTOLIN HFA) 108 (90 Base) MCG/ACT inhaler Inhale 2 puffs into the lungs every 6 (six) hours as needed for wheezing or shortness of breath.   amiodarone (PACERONE) 200 MG tablet Take 200 mg by mouth daily.   BIOTIN PO Take 600 mcg by mouth 2 (two) times daily.   carboxymethylcellul-glycerin (REFRESH RELIEVA) 0.5-0.9 % ophthalmic solution Place 1 drop into both eyes daily as needed for dry eyes.   Cholecalciferol (VITAMIN D3) 50 MCG (2000 UT) TABS Take 2,000 Units by mouth daily at 12 noon.   Coenzyme Q10 100 MG TABS Take 100 mg by mouth daily  at 12 noon.    estradiol (ESTRACE) 0.1 MG/GM vaginal cream Place 1 Applicatorful vaginally every Monday, Wednesday, and Friday.    Evolocumab (REPATHA) 140 MG/ML SOSY Inject 140 mg into the skin every 14 (fourteen) days.   ezetimibe (ZETIA) 10 MG tablet TAKE 1 TABLET BY MOUTH EVERY EVENING. (Patient taking differently: Take 10 mg by mouth daily.)   famotidine (PEPCID) 40 MG tablet Take 40 mg by mouth daily.   febuxostat (ULORIC) 40 MG tablet Take 40 mg by mouth at bedtime.   ferrous sulfate 325 (65 FE) MG EC tablet Take 325 mg by mouth 2 (two) times daily with breakfast and lunch.   folic acid (FOLVITE) 664 MCG tablet Take 800 mcg by mouth daily at 12 noon.   furosemide (LASIX) 20 MG tablet Take 40 mg by mouth 2 (two) times daily with breakfast and lunch.   Glucosamine-Chondroitin (OSTEO BI-FLEX REGULAR STRENGTH PO) Take 1 tablet by mouth daily at 12 noon. Unknown strength   isosorbide mononitrate (IMDUR) 30 MG 24 hr tablet TAKE 1 TABLET BY MOUTH  DAILY (Patient taking differently: Take 30 mg by mouth daily.)   Lactobacillus (FLORAJEN ACIDOPHILUS) CAPS Take 1 capsule by mouth daily at 12 noon.   levothyroxine (SYNTHROID, LEVOTHROID) 50 MCG tablet Take 50 mcg by mouth daily before breakfast.   Multiple Vitamins-Minerals (PRESERVISION AREDS 2) CAPS Take 1 capsule by mouth 2 (two) times daily. Unknow strength   nitroGLYCERIN (NITROSTAT) 0.4 MG SL tablet Place 0.4 mg under the tongue every 5 (five) minutes as needed for chest pain.   omega-3 acid ethyl esters (LOVAZA) 1 g capsule Take 2 g by mouth daily at 12 noon.   pantoprazole (PROTONIX) 40 MG tablet TAKE 1 TABLET BY MOUTH DAILY (Patient taking differently: Take 40 mg by mouth daily.)   polyethylene glycol powder (GLYCOLAX/MIRALAX) powder Take 17 g by mouth every other day. Alternating between miralax and benefiber   pravastatin (PRAVACHOL) 40 MG tablet Take 40 mg by mouth at bedtime.   ranolazine (RANEXA) 1000 MG SR tablet TAKE 1 TABLET BY MOUTH 2  TIMES DAILY. (Patient taking differently: Take 1,000 mg by mouth 2 (two) times daily.)   traMADol (ULTRAM) 50 MG tablet Take 50 mg by mouth every 12 (twelve) hours as needed for severe pain.   vitamin B-12 (CYANOCOBALAMIN) 1000 MCG tablet Take 1,000 mcg by mouth daily at 12 noon.   zolpidem (AMBIEN) 5 MG tablet Take 5 mg by mouth at bedtime as needed for sleep.   Current Facility-Administered Medications for the 01/20/21 encounter (Office Visit) with Park Liter, MD  Medication   0.9 %  sodium chloride infusion     Allergies:   Ciprofloxacin, Contrast media [iodinated diagnostic agents], Nsaids, and Statins   Social History   Socioeconomic History   Marital status: Married    Spouse name: Not on file   Number of children: 2   Years of education: Not on file   Highest education level: Not on file  Occupational History   Occupation: Retired   Tobacco Use   Smoking status: Former   Smokeless tobacco: Never  Scientific laboratory technician Use: Never used  Substance and Sexual Activity   Alcohol use: No   Drug use: No   Sexual activity: Not on file  Other Topics Concern   Not on file  Social History Narrative   Not on file   Social Determinants of Health   Financial Resource Strain: Not on file  Food Insecurity: Not on file  Transportation Needs: Not on file  Physical Activity: Not on file  Stress: Not on file  Social Connections: Not on file     Family History: The patient's family history includes Breast cancer in her mother; Cancer in her maternal grandfather; Diabetes in her sister; Heart attack in her brother, maternal uncle, and sister; Hypertension in her father; Prostate cancer in her father; Stroke in her father. There is no history of Esophageal cancer. ROS:   Please see the history of present illness.    All 14 point review of systems negative except as described per history of present illness  EKGs/Labs/Other Studies Reviewed:      Recent Labs: 06/10/2020:  NT-Pro BNP 2,349 08/28/2020: Platelets 243 09/06/2020: BUN 43; Creatinine, Ser 1.70; Hemoglobin 10.9; Potassium 4.8; Sodium 140  Recent Lipid Panel    Component Value Date/Time   CHOL 125 09/05/2019 0842   TRIG 162 (H) 09/05/2019 0842   HDL 59 09/05/2019 0842   CHOLHDL 2.1 09/05/2019 0842   LDLCALC 39 09/05/2019 0842    Physical Exam:  VS:  BP 138/74 (BP Location: Left Arm, Patient Position: Sitting)    Pulse 80    Ht 5\' 3"  (1.6 m)    Wt 186 lb (84.4 kg)    SpO2 98%    BMI 32.95 kg/m     Wt Readings from Last 3 Encounters:  01/20/21 186 lb (84.4 kg)  11/04/20 182 lb (82.6 kg)  10/23/20 187 lb 6.4 oz (85 kg)     GEN:  Well nourished, well developed in no acute distress HEENT: Normal NECK: No JVD; No carotid bruits LYMPHATICS: No lymphadenopathy CARDIAC: RRR, holosystolic murmur grade 33/0 best heard at apex, no rubs, no gallops RESPIRATORY:  Clear to auscultation without rales, wheezing or rhonchi  ABDOMEN: Soft, non-tender, non-distended MUSCULOSKELETAL:  No edema; No deformity  SKIN: Warm and dry LOWER EXTREMITIES: no swelling NEUROLOGIC:  Alert and oriented x 3 PSYCHIATRIC:  Normal affect   ASSESSMENT:    1. Paroxysmal atrial fibrillation (HCC)   2. Benign hypertension with CKD (chronic kidney disease) stage IV ([place])   3. LV dysfunction   4. Nonrheumatic mitral valve regurgitation   5. OSA on CPAP    PLAN:    In order of problems listed above:  Paroxysmal atrial fibrillation EKG confirms sinus rhythm she is not anticoagulate because of frequent GI bleed however she does have a watchman device.  We will continue monitoring continue amiodarone. Chronic kidney failure with creatinine neighborhood 2.  That stable continue present management.  We will avoid nephrotoxic medications. History of LV dysfunction now echocardiogram repeated showed preserved normal left ventricle ejection fraction. Mitral valve regurgitation has been assessed as severe based on echocardiogram  done during the time of cardioversion however proper diuresis and medications blood pressure measurements repeated echocardiogram showed moderate mitral regurgitation.  He will be now to keep her blood pressure under control and also fluid status under control. Obstructive sleep apnea that being followed by antimedicine team She is talking about potentially having hip surgery.  I told him she could talk to surgeon to see if this is the only option for this complex clinical scenario since clearly she does have some risk from cardiac standpoint reviewed to have the surgery   Medication Adjustments/Labs and Tests Ordered: Current medicines are reviewed at length with the patient today.  Concerns regarding medicines are outlined above.  No orders of the defined types were placed in this encounter.  Medication changes: No orders of the defined types were placed in this encounter.   Signed, Park Liter, MD, [facility] 01/20/2021 2:25 PM    [facility]

## 2021-01-29 NOTE — Progress Notes (Signed)
Remote pacemaker transmission.   

## 2021-02-18 ENCOUNTER — Other Ambulatory Visit: Payer: Self-pay | Admitting: Cardiology

## 2021-02-19 ENCOUNTER — Other Ambulatory Visit: Payer: Self-pay

## 2021-02-19 MED ORDER — OMEGA-3-ACID ETHYL ESTERS 1 G PO CAPS: 2.0000 g | ORAL_CAPSULE | Freq: Every day | ORAL | 3 refills | Status: DC

## 2021-02-28 DIAGNOSIS — M25552 Pain in left hip: Secondary | ICD-10-CM | POA: Diagnosis not present

## 2021-02-28 DIAGNOSIS — M1612 Unilateral primary osteoarthritis, left hip: Secondary | ICD-10-CM | POA: Diagnosis not present

## 2021-03-04 ENCOUNTER — Other Ambulatory Visit: Payer: Self-pay | Admitting: Cardiology

## 2021-03-04 ENCOUNTER — Telehealth: Payer: Self-pay

## 2021-03-04 ENCOUNTER — Other Ambulatory Visit: Payer: Self-pay

## 2021-03-04 NOTE — Telephone Encounter (Signed)
° °  Pre-operative Risk Assessment    Patient Name: Amanda [name]  DOB: 1940-02-17 MRN: [id number]      Request for Surgical Clearance    Procedure:   Left total hip arthroplasty  Date of Surgery:  Clearance 07/02/21                             Surgeon:  Dr. Gaynelle Arabian  Surgeon's Group or Practice Name:  [facility] Phone number:  [phone] Fax number:  [phone] Attention [name]   Type of Clearance Requested:   - Medical    Type of Anesthesia:   Choice   Additional requests/questions:    SignedLowella Grip   03/04/2021, 4:34 PM

## 2021-03-06 NOTE — Telephone Encounter (Signed)
Faxed back to requesting surgeons office to make them aware.

## 2021-03-06 NOTE — Telephone Encounter (Signed)
Spoke with patient and scheduled her for a sooner pre-op clearance appointment with Dr Agustin Cree on 06/06/2021 at 2:20 PM. I will leave 06/23/2021 appointment as is just in case it is necessary.

## 2021-03-06 NOTE — Telephone Encounter (Signed)
° °  Name: Amanda [name]  DOB: Dec 04, 1940  MRN: [id number]  Primary Cardiologist: Jenne Campus, MD  Chart reviewed as part of pre-operative protocol coverage. Because of [name]'s past medical history and time since last visit, she will require a follow-up visit in order to better assess preoperative cardiovascular risk.  Pre-op covering staff: - Please schedule appointment and call patient to inform them. If patient already had an upcoming appointment within acceptable timeframe, please add "pre-op clearance" to the appointment notes so provider is aware. - Please contact requesting surgeon's office via preferred method (i.e, phone, fax) to inform them of need for appointment prior to surgery.  If applicable, this message will also be routed to pharmacy pool and/or primary cardiologist for input on holding anticoagulant/antiplatelet agent as requested below so that this information is available to the clearing provider at time of patient's appointment.   Surgery is currently scheduled for 07/02/2021, however next follow-up with Dr. Agustin Cree is on 06/23/2021.  During the previous office visit, Dr. Agustin Cree has some concern about the patient's overall risk proceeding with hip surgery.  Please arrange earlier visit, either in April or beginning of May with Dr. Agustin Cree to discuss preoperative clearance.  [place], [place]  03/06/2021, 12:59 PM

## 2021-03-18 DIAGNOSIS — M810 Age-related osteoporosis without current pathological fracture: Secondary | ICD-10-CM | POA: Diagnosis not present

## 2021-03-23 ENCOUNTER — Emergency Department (HOSPITAL_COMMUNITY): Payer: Medicare Other

## 2021-03-23 ENCOUNTER — Inpatient Hospital Stay (HOSPITAL_COMMUNITY)
Admission: EM | Admit: 2021-03-23 | Discharge: 2021-03-31 | DRG: 522 | Disposition: A | Discharge status: Home Health | Payer: Medicare Other | Attending: Internal Medicine | Admitting: Internal Medicine

## 2021-03-23 ENCOUNTER — Other Ambulatory Visit: Payer: Self-pay

## 2021-03-23 ENCOUNTER — Encounter (HOSPITAL_COMMUNITY): Payer: Self-pay | Admitting: Emergency Medicine

## 2021-03-23 DIAGNOSIS — S72145A Nondisplaced intertrochanteric fracture of left femur, initial encounter for closed fracture: Secondary | ICD-10-CM | POA: Diagnosis not present

## 2021-03-23 DIAGNOSIS — Z950 Presence of cardiac pacemaker: Secondary | ICD-10-CM | POA: Diagnosis not present

## 2021-03-23 DIAGNOSIS — I4821 Permanent atrial fibrillation: Secondary | ICD-10-CM | POA: Diagnosis present

## 2021-03-23 DIAGNOSIS — E669 Obesity, unspecified: Secondary | ICD-10-CM | POA: Diagnosis present

## 2021-03-23 DIAGNOSIS — W19XXXA Unspecified fall, initial encounter: Secondary | ICD-10-CM | POA: Diagnosis not present

## 2021-03-23 DIAGNOSIS — E785 Hyperlipidemia, unspecified: Secondary | ICD-10-CM | POA: Diagnosis present

## 2021-03-23 DIAGNOSIS — I252 Old myocardial infarction: Secondary | ICD-10-CM | POA: Diagnosis not present

## 2021-03-23 DIAGNOSIS — G4733 Obstructive sleep apnea (adult) (pediatric): Secondary | ICD-10-CM | POA: Diagnosis present

## 2021-03-23 DIAGNOSIS — I5022 Chronic systolic (congestive) heart failure: Secondary | ICD-10-CM | POA: Diagnosis present

## 2021-03-23 DIAGNOSIS — M1612 Unilateral primary osteoarthritis, left hip: Secondary | ICD-10-CM | POA: Diagnosis not present

## 2021-03-23 DIAGNOSIS — Z79818 Long term (current) use of other agents affecting estrogen receptors and estrogen levels: Secondary | ICD-10-CM

## 2021-03-23 DIAGNOSIS — R059 Cough, unspecified: Secondary | ICD-10-CM | POA: Diagnosis not present

## 2021-03-23 DIAGNOSIS — I4891 Unspecified atrial fibrillation: Secondary | ICD-10-CM | POA: Diagnosis not present

## 2021-03-23 DIAGNOSIS — Y92000 Kitchen of unspecified non-institutional (private) residence as  the place of occurrence of the external cause: Secondary | ICD-10-CM

## 2021-03-23 DIAGNOSIS — K5903 Drug induced constipation: Secondary | ICD-10-CM | POA: Diagnosis not present

## 2021-03-23 DIAGNOSIS — I442 Atrioventricular block, complete: Secondary | ICD-10-CM | POA: Diagnosis not present

## 2021-03-23 DIAGNOSIS — I2510 Atherosclerotic heart disease of native coronary artery without angina pectoris: Secondary | ICD-10-CM | POA: Diagnosis present

## 2021-03-23 DIAGNOSIS — Z8673 Personal history of transient ischemic attack (TIA), and cerebral infarction without residual deficits: Secondary | ICD-10-CM | POA: Diagnosis not present

## 2021-03-23 DIAGNOSIS — S7292XA Unspecified fracture of left femur, initial encounter for closed fracture: Secondary | ICD-10-CM | POA: Diagnosis not present

## 2021-03-23 DIAGNOSIS — Z886 Allergy status to analgesic agent status: Secondary | ICD-10-CM

## 2021-03-23 DIAGNOSIS — D631 Anemia in chronic kidney disease: Secondary | ICD-10-CM | POA: Diagnosis present

## 2021-03-23 DIAGNOSIS — S72002A Fracture of unspecified part of neck of left femur, initial encounter for closed fracture: Secondary | ICD-10-CM | POA: Diagnosis present

## 2021-03-23 DIAGNOSIS — Z888 Allergy status to other drugs, medicaments and biological substances status: Secondary | ICD-10-CM

## 2021-03-23 DIAGNOSIS — E039 Hypothyroidism, unspecified: Secondary | ICD-10-CM | POA: Diagnosis present

## 2021-03-23 DIAGNOSIS — I440 Atrioventricular block, first degree: Secondary | ICD-10-CM | POA: Diagnosis not present

## 2021-03-23 DIAGNOSIS — Z01818 Encounter for other preprocedural examination: Secondary | ICD-10-CM | POA: Diagnosis not present

## 2021-03-23 DIAGNOSIS — Z96642 Presence of left artificial hip joint: Secondary | ICD-10-CM | POA: Diagnosis present

## 2021-03-23 DIAGNOSIS — Z6831 Body mass index (BMI) 31.0-31.9, adult: Secondary | ICD-10-CM

## 2021-03-23 DIAGNOSIS — Z8249 Family history of ischemic heart disease and other diseases of the circulatory system: Secondary | ICD-10-CM

## 2021-03-23 DIAGNOSIS — I130 Hypertensive heart and chronic kidney disease with heart failure and stage 1 through stage 4 chronic kidney disease, or unspecified chronic kidney disease: Secondary | ICD-10-CM | POA: Diagnosis present

## 2021-03-23 DIAGNOSIS — Z823 Family history of stroke: Secondary | ICD-10-CM

## 2021-03-23 DIAGNOSIS — K9189 Other postprocedural complications and disorders of digestive system: Secondary | ICD-10-CM | POA: Diagnosis not present

## 2021-03-23 DIAGNOSIS — D62 Acute posthemorrhagic anemia: Secondary | ICD-10-CM | POA: Diagnosis present

## 2021-03-23 DIAGNOSIS — R0602 Shortness of breath: Secondary | ICD-10-CM

## 2021-03-23 DIAGNOSIS — Z87891 Personal history of nicotine dependence: Secondary | ICD-10-CM

## 2021-03-23 DIAGNOSIS — T8182XA Emphysema (subcutaneous) resulting from a procedure, initial encounter: Secondary | ICD-10-CM | POA: Diagnosis not present

## 2021-03-23 DIAGNOSIS — M25552 Pain in left hip: Secondary | ICD-10-CM | POA: Diagnosis not present

## 2021-03-23 DIAGNOSIS — Z20822 Contact with and (suspected) exposure to covid-19: Secondary | ICD-10-CM | POA: Diagnosis present

## 2021-03-23 DIAGNOSIS — R739 Hyperglycemia, unspecified: Secondary | ICD-10-CM | POA: Diagnosis not present

## 2021-03-23 DIAGNOSIS — I499 Cardiac arrhythmia, unspecified: Secondary | ICD-10-CM | POA: Diagnosis not present

## 2021-03-23 DIAGNOSIS — Z955 Presence of coronary angioplasty implant and graft: Secondary | ICD-10-CM | POA: Diagnosis not present

## 2021-03-23 DIAGNOSIS — Z833 Family history of diabetes mellitus: Secondary | ICD-10-CM

## 2021-03-23 DIAGNOSIS — W1830XA Fall on same level, unspecified, initial encounter: Secondary | ICD-10-CM | POA: Diagnosis present

## 2021-03-23 DIAGNOSIS — I129 Hypertensive chronic kidney disease with stage 1 through stage 4 chronic kidney disease, or unspecified chronic kidney disease: Secondary | ICD-10-CM

## 2021-03-23 DIAGNOSIS — N184 Chronic kidney disease, stage 4 (severe): Secondary | ICD-10-CM | POA: Diagnosis present

## 2021-03-23 DIAGNOSIS — Z9989 Dependence on other enabling machines and devices: Secondary | ICD-10-CM

## 2021-03-23 DIAGNOSIS — Z881 Allergy status to other antibiotic agents status: Secondary | ICD-10-CM | POA: Diagnosis not present

## 2021-03-23 DIAGNOSIS — Z471 Aftercare following joint replacement surgery: Secondary | ICD-10-CM | POA: Diagnosis not present

## 2021-03-23 DIAGNOSIS — K219 Gastro-esophageal reflux disease without esophagitis: Secondary | ICD-10-CM | POA: Diagnosis present

## 2021-03-23 DIAGNOSIS — E1122 Type 2 diabetes mellitus with diabetic chronic kidney disease: Secondary | ICD-10-CM | POA: Diagnosis present

## 2021-03-23 DIAGNOSIS — Z7982 Long term (current) use of aspirin: Secondary | ICD-10-CM

## 2021-03-23 DIAGNOSIS — Z79899 Other long term (current) drug therapy: Secondary | ICD-10-CM | POA: Diagnosis not present

## 2021-03-23 DIAGNOSIS — S72142A Displaced intertrochanteric fracture of left femur, initial encounter for closed fracture: Secondary | ICD-10-CM | POA: Diagnosis not present

## 2021-03-23 DIAGNOSIS — Z79890 Hormone replacement therapy (postmenopausal): Secondary | ICD-10-CM

## 2021-03-23 DIAGNOSIS — Z96649 Presence of unspecified artificial hip joint: Secondary | ICD-10-CM

## 2021-03-23 DIAGNOSIS — Z95818 Presence of other cardiac implants and grafts: Secondary | ICD-10-CM | POA: Diagnosis not present

## 2021-03-23 DIAGNOSIS — Z91041 Radiographic dye allergy status: Secondary | ICD-10-CM

## 2021-03-23 DIAGNOSIS — K5900 Constipation, unspecified: Secondary | ICD-10-CM | POA: Diagnosis not present

## 2021-03-23 DIAGNOSIS — I5042 Chronic combined systolic (congestive) and diastolic (congestive) heart failure: Secondary | ICD-10-CM | POA: Diagnosis not present

## 2021-03-23 DIAGNOSIS — R9431 Abnormal electrocardiogram [ECG] [EKG]: Secondary | ICD-10-CM | POA: Diagnosis not present

## 2021-03-23 DIAGNOSIS — I517 Cardiomegaly: Secondary | ICD-10-CM | POA: Diagnosis not present

## 2021-03-23 HISTORY — DX: Fracture of unspecified part of neck of left femur, initial encounter for closed fracture: S72.002A

## 2021-03-23 LAB — COMPREHENSIVE METABOLIC PANEL
ALT: 33 U/L (ref 0–44)
AST: 25 U/L (ref 15–41)
Albumin: 4.2 g/dL (ref 3.5–5.0)
Alkaline Phosphatase: 68 U/L (ref 38–126)
Anion gap: 8 (ref 5–15)
BUN: 38 mg/dL — ABNORMAL HIGH (ref 8–23)
CO2: 28 mmol/L (ref 22–32)
Calcium: 9.7 mg/dL (ref 8.9–10.3)
Chloride: 103 mmol/L (ref 98–111)
Creatinine, Ser: 1.63 mg/dL — ABNORMAL HIGH (ref 0.44–1.00)
GFR, Estimated: 32 mL/min — ABNORMAL LOW (ref >60)
Glucose, Bld: 234 mg/dL — ABNORMAL HIGH (ref 70–99)
Potassium: 4.9 mmol/L (ref 3.5–5.1)
Sodium: 139 mmol/L (ref 135–145)
Total Bilirubin: 0.6 mg/dL (ref 0.3–1.2)
Total Protein: 7.4 g/dL (ref 6.5–8.1)

## 2021-03-23 LAB — CBC WITH DIFFERENTIAL/PLATELET
Abs Immature Granulocytes: 0.03 10*3/uL (ref 0.00–0.07)
Basophils Absolute: 0 10*3/uL (ref 0.0–0.1)
Basophils Relative: 0 %
Eosinophils Absolute: 0.3 10*3/uL (ref 0.0–0.5)
Eosinophils Relative: 5 %
HCT: 41.3 % (ref 36.0–46.0)
Hemoglobin: 13.3 g/dL (ref 12.0–15.0)
Immature Granulocytes: 1 %
Lymphocytes Relative: 14 %
Lymphs Abs: 0.8 10*3/uL (ref 0.7–4.0)
MCH: 32 pg (ref 26.0–34.0)
MCHC: 32.2 g/dL (ref 30.0–36.0)
MCV: 99.5 fL (ref 80.0–100.0)
Monocytes Absolute: 0.5 10*3/uL (ref 0.1–1.0)
Monocytes Relative: 9 %
Neutro Abs: 4 10*3/uL (ref 1.7–7.7)
Neutrophils Relative %: 71 %
Platelets: 206 10*3/uL (ref 150–400)
RBC: 4.15 MIL/uL (ref 3.87–5.11)
RDW: 12.4 % (ref 11.5–15.5)
WBC: 5.6 10*3/uL (ref 4.0–10.5)
nRBC: 0 % (ref 0.0–0.2)

## 2021-03-23 LAB — RESP PANEL BY RT-PCR (FLU A&B, COVID) ARPGX2
Influenza A by PCR: NEGATIVE
Influenza B by PCR: NEGATIVE
SARS Coronavirus 2 by RT PCR: NEGATIVE

## 2021-03-23 MED ORDER — ONDANSETRON HCL 4 MG/2ML IJ SOLN
4.0000 mg | Freq: Once | INTRAMUSCULAR | Status: AC
  Administered 2021-03-23: 4 mg via INTRAVENOUS
  Filled 2021-03-23: qty 2

## 2021-03-23 MED ORDER — OXYCODONE HCL 5 MG PO TABS
5.0000 mg | ORAL_TABLET | Freq: Once | ORAL | Status: AC
  Administered 2021-03-24: 5 mg via ORAL
  Filled 2021-03-23: qty 1

## 2021-03-23 MED ORDER — FENTANYL CITRATE PF 50 MCG/ML IJ SOSY
50.0000 ug | PREFILLED_SYRINGE | Freq: Once | INTRAMUSCULAR | Status: AC
  Administered 2021-03-23: 50 ug via INTRAVENOUS
  Filled 2021-03-23: qty 1

## 2021-03-23 NOTE — Assessment & Plan Note (Signed)
Patient had watchman placed due to history of GI bleeding while on anticoagulation.

## 2021-03-23 NOTE — H&P (Signed)
History and Physical    EVELENE ROUSSIN ERX:[id number] DOB: 1940-06-20 DOA: 03/23/2021  DOS: the patient was seen and examined on 03/23/2021  PCP: Street, [place], MD   Patient coming from: Home  I have personally briefly reviewed patient's old medical records in [place]  Chief complaint: Left hip pain, fall History of present illness: 81 year old female with a history of A-fib, history of complete heart block status post permanent pacemaker, history of Watchman device, history of CKD stage IV baseline creatinine 1.7-2.0, anemia chronic kidney disease, type 2 diabetes, coronary disease, chronic systolic heart failure, OSA on CPAP, left hip osteoarthritis presents the ER today after a fall in the kitchen.  Patient states she was at the stove.  She tried to turn around.  She lost her balance.  She landed on her left hip.  She had immediate pain.  Patient brought to the ER.  Vital signs temp 97.9 heart rate 64 blood pressure 172/81 satting 100% on room air  X-ray showed a intertrochanteric fracture on the left side.  Chest x-ray was negative.  Laboratory evaluation COVID test was negative.  Sodium 139, potassium 4.9, BUN of 38, creatinine 2.6, glucose 234  white count 5.6, hemoglobin 13.3, platelets 206  No EKG performed that the patient has a permanent pacemaker.  EDP is notified orthopedics.  [facility] hospital[facility] contacted for admission.   ED Course: xrays left intertroch fracture  Review of Systems:  Review of Systems  Constitutional: Negative.  Negative for chills, fever and weight loss.  HENT: Negative.  Negative for ear pain, hearing loss and tinnitus.   Eyes: Negative.  Negative for blurred vision.  Respiratory: Negative.  Negative for cough, hemoptysis and sputum production.   Cardiovascular: Negative.  Negative for chest pain, palpitations and orthopnea.  Gastrointestinal: Negative.  Negative for abdominal pain, heartburn, nausea and vomiting.   Genitourinary: Negative.  Negative for dysuria, frequency and urgency.  Musculoskeletal:  Positive for joint pain.  Skin: Negative.  Negative for itching and rash.  Neurological: Negative.  Negative for dizziness, tingling and headaches.  Endo/Heme/Allergies: Negative.   Psychiatric/Behavioral: Negative.    All other systems reviewed and are negative.  Past Medical History:  Diagnosis Date   Acute ischemic stroke ([place])    Acute metabolic encephalopathy    Acute on chronic systolic (congestive) heart failure (HCC)    Acute renal insufficiency 09/02/2016   Anemia due to stage 4 chronic kidney disease ([place]) 04/04/2015   Atrial fibrillation (HCC)    Benign hypertension with CKD (chronic kidney disease) stage IV ([place]) 04/04/2015   Bradycardia 09/17/2014   Chest pain 06/23/2016   Overview:  Added automatically from request for surgery [id number]   Chronic anemia    Chronic kidney disease (CKD), stage III (moderate) ([place])    Coronary artery disease 10/12/2016   Non drug-eluting stent implanted in June 2018 to mid RCA   08/08/2016 10:37  Angiographic Findings  Cardiac Arteries and Lesion Findings LMCA: 0% and Normal. LAD: 0% and Normal. RCA:   Lesion on Mid RCA: Mid subsection.95% stenosis reduced to 0%. Pre procedure   TIMI II flow was noted. Post Procedure TIMI III flow was present. Poor run   off was present. The lesion was diagnosed as High Risk (C).    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 ([place]) 04/04/2015   Diabetes mellitus without complication ([place])    Diverticulosis    Dyslipidemia 09/17/2014   Gastroesophageal reflux    GI bleed 08/06/2016  Heart block AV complete ([place]) 09/05/2019   History of cardiac monitoring 02/23/2018   monitor inserted   Hyperlipidemia    Hypertensive heart disease with heart failure (HCC)    Iron deficiency anemia 12/28/2017   LV dysfunction 09/02/2016   Myocardial infarction [facility])    NSTEMI (non-ST elevated myocardial infarction) ([place]) 08/06/2016    Obstructive sleep apnea 09/17/2014   Orthostatic hypotension 12/28/2017   OSA on CPAP    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   Postural dizziness with presyncope 09/23/2017   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/24/2017   Recurrent left pleural effusion 03/14/2015   Renal failure, chronic, stage 3 (moderate) ([place])    Syncope 46/06/6810   Systolic heart failure ([place])    Thyroid disease    Vitamin D deficiency 04/04/2015    Past Surgical History:  Procedure Laterality Date   BUBBLE STUDY  05/16/2020   Procedure: BUBBLE STUDY;  Surgeon: Elouise Munroe, MD;  Location: [place];  Service: Cardiovascular;;   CARDIAC CATHETERIZATION     CARDIOVERSION N/A 05/16/2020   Procedure: CARDIOVERSION;  Surgeon: Elouise Munroe, MD;  Location: [place];  Service: Cardiovascular;  Laterality: N/A;   CARDIOVERSION N/A 09/06/2020   Procedure: CARDIOVERSION;  Surgeon: Donato Heinz, MD;  Location: [facility] ENDOSCOPY;  Service: Cardiovascular;  Laterality: N/A;   COLONOSCOPY  11/21/2014   Moderate predominantly sigmoid diverticulosis. Otherwise noraml collonscopy to TI.    CORONARY ANGIOPLASTY WITH STENT PLACEMENT  08/2016   EXCISIONAL HEMORRHOIDECTOMY     LEFT ATRIAL APPENDAGE OCCLUSION  11/2016   in [place] N/A 02/23/2018   Procedure: [place];  Surgeon: Constance Haw, MD;  Location: [place] CV LAB;  Service: Cardiovascular;  Laterality: N/A;   LOOP RECORDER REMOVAL N/A 04/24/2019   Procedure: LOOP RECORDER REMOVAL;  Surgeon: Constance Haw, MD;  Location: [place] CV LAB;  Service: Cardiovascular;  Laterality: N/A;   NOSE SURGERY     PACEMAKER IMPLANT N/A 04/24/2019   Procedure: PACEMAKER IMPLANT;  Surgeon: Constance Haw, MD;  Location: [place] CV LAB;  Service: Cardiovascular;  Laterality: N/A;   TEE WITHOUT CARDIOVERSION N/A 05/16/2020   Procedure: TRANSESOPHAGEAL ECHOCARDIOGRAM (TEE);   Surgeon: Elouise Munroe, MD;  Location: [facility] ENDOSCOPY;  Service: Cardiovascular;  Laterality: N/A;     reports that she has quit smoking. She quit smokeless tobacco use about 26 years ago. She reports that she does not currently use alcohol after a past usage of about 2.0 standard drinks per week. She reports that she does not use drugs.  Allergies  Allergen Reactions   Ciprofloxacin Other (See Comments)    Achilles tendon pain   Contrast Media [Iodinated Contrast Media]     Avoid due to stage 4 kidney disease    Nsaids     Avoid due to stage 4 kidney disease    Statins Other (See Comments)    Joint pain, tolerates pravastatin     Family History  Problem Relation Age of Onset   Breast cancer Mother    Hypertension Father    Prostate cancer Father    Stroke Father    Diabetes Sister    Heart attack Sister    Heart attack Brother    Heart attack Maternal Uncle    Cancer Maternal Grandfather        not sure if it is colon or rectal    Esophageal cancer Neg Hx  Prior to Admission medications   Medication Sig Start Date End Date Taking? Authorizing Provider  albuterol (VENTOLIN HFA) 108 (90 Base) MCG/ACT inhaler Inhale 2 puffs into the lungs every 6 (six) hours as needed for wheezing or shortness of breath.    [provider]  amiodarone (PACERONE) 200 MG tablet TAKE 1 TABLET BY MOUTH DAILY. 03/04/21   Camnitz, Ocie Doyne, MD  BIOTIN PO Take 600 mcg by mouth 2 (two) times daily.    [provider]  carboxymethylcellul-glycerin (REFRESH RELIEVA) 0.5-0.9 % ophthalmic solution Place 1 drop into both eyes daily as needed for dry eyes.    [provider]  Cholecalciferol (VITAMIN D3) 50 MCG (2000 UT) TABS Take 2,000 Units by mouth daily at 12 noon.    [provider]  Coenzyme Q10 100 MG TABS Take 100 mg by mouth daily at 12 noon.     [provider]  estradiol (ESTRACE) 0.1 MG/GM vaginal cream Place 1 Applicatorful vaginally every  Monday, Wednesday, and Friday.     [provider]  Evolocumab (REPATHA) 140 MG/ML SOSY Inject 140 mg into the skin every 14 (fourteen) days.    [provider]  ezetimibe (ZETIA) 10 MG tablet TAKE 1 TABLET BY MOUTH EVERY EVENING. Patient taking differently: Take 10 mg by mouth daily. 07/15/20   Park Liter, MD  famotidine (PEPCID) 40 MG tablet Take 40 mg by mouth daily. 05/06/20   [provider]  febuxostat (ULORIC) 40 MG tablet Take 40 mg by mouth at bedtime.    [provider]  ferrous sulfate 325 (65 FE) MG EC tablet Take 325 mg by mouth 2 (two) times daily with breakfast and lunch. 12/28/17   [provider]  folic acid (FOLVITE) 818 MCG tablet Take 800 mcg by mouth daily at 12 noon.    [provider]  furosemide (LASIX) 20 MG tablet Take 40 mg by mouth 2 (two) times daily with breakfast and lunch.    [provider]  Glucosamine-Chondroitin (OSTEO BI-FLEX REGULAR STRENGTH PO) Take 1 tablet by mouth daily at 12 noon. Unknown strength    [provider]  isosorbide mononitrate (IMDUR) 30 MG 24 hr tablet TAKE 1 TABLET BY MOUTH DAILY Patient taking differently: Take 30 mg by mouth daily. 08/12/20   Park Liter, MD  Lactobacillus [facility] ACIDOPHILUS) CAPS Take 1 capsule by mouth daily at 12 noon.    [provider]  levothyroxine (SYNTHROID, LEVOTHROID) 50 MCG tablet Take 50 mcg by mouth daily before breakfast.    [provider]  Multiple Vitamins-Minerals (PRESERVISION AREDS 2) CAPS Take 1 capsule by mouth 2 (two) times daily. Unknow strength    [provider]  nitroGLYCERIN (NITROSTAT) 0.4 MG SL tablet Place 0.4 mg under the tongue every 5 (five) minutes as needed for chest pain.    [provider]  omega-3 acid ethyl esters (LOVAZA) 1 g capsule Take 2 capsules (2 g total) by mouth daily at 12 noon. 02/19/21   Revankar, Reita Cliche, MD  pantoprazole (PROTONIX) 40 MG tablet TAKE  1 TABLET BY MOUTH DAILY Patient taking differently: Take 40 mg by mouth daily. 04/30/20   Park Liter, MD  polyethylene glycol powder (GLYCOLAX/MIRALAX) powder Take 17 g by mouth every other day. Alternating between miralax and benefiber    [provider]  pravastatin (PRAVACHOL) 40 MG tablet Take 40 mg by mouth at bedtime.    [provider]  ranolazine (RANEXA) 1000 MG SR tablet  Take 1 tablet (1,000 mg total) by mouth 2 (two) times daily. 03/04/21   Park Liter, MD  traMADol (ULTRAM) 50 MG tablet Take 50 mg by mouth every 12 (twelve) hours as needed for severe pain.    [provider]  vitamin B-12 (CYANOCOBALAMIN) 1000 MCG tablet Take 1,000 mcg by mouth daily at 12 noon.    [provider]  zolpidem (AMBIEN) 5 MG tablet Take 5 mg by mouth at bedtime as needed for sleep. 06/10/20   [provider]    Physical Exam: Vitals:   03/23/21 2043 03/23/21 2045 03/23/21 2130 03/23/21 2230  BP: (!) 172/81 (!) 186/79 (!) 177/109 (!) 195/93  Pulse: 64 65 65 65  Resp: 14 19 18  (!) 27  Temp: 97.9 F (36.6 C)     TempSrc: Oral     SpO2: 100% 100% 100% 100%  Weight: 81.6 kg     Height: 5\' 3"  (1.6 m)       Physical Exam Vitals and nursing note reviewed.  Constitutional:      General: She is not in acute distress.    Appearance: Normal appearance. She is obese. She is not ill-appearing, toxic-appearing or diaphoretic.  HENT:     Head: Normocephalic and atraumatic.     Nose: Nose normal. No rhinorrhea.  Eyes:     General:        Right eye: No discharge.        Left eye: No discharge.  Cardiovascular:     Rate and Rhythm: Normal rate and regular rhythm.     Pulses: Normal pulses.  Pulmonary:     Effort: Pulmonary effort is normal. No respiratory distress.     Breath sounds: Normal breath sounds. No wheezing or rales.  Abdominal:     General: Abdomen is protuberant. Bowel sounds are normal. There is no distension.     Palpations:  Abdomen is soft.     Tenderness: There is no abdominal tenderness. There is no guarding or rebound.  Musculoskeletal:        General: Tenderness present.     Right lower leg: No edema.     Left lower leg: No edema.     Comments: Tenderness with palpation left greater trochanter area  Skin:    General: Skin is warm and dry.     Capillary Refill: Capillary refill takes less than 2 seconds.     Comments: Venous stasis changes bilateral lower legs  Neurological:     General: No focal deficit present.     Mental Status: She is alert and oriented to person, place, and time.     Labs on Admission: I have personally reviewed following labs and imaging studies  CBC: Recent Labs  Lab 03/23/21 2146  WBC 5.6  NEUTROABS 4.0  HGB 13.3  HCT 41.3  MCV 99.5  PLT 371   Basic Metabolic Panel: Recent Labs  Lab 03/23/21 2146  NA 139  K 4.9  CL 103  CO2 28  GLUCOSE 234*  BUN 38*  CREATININE 1.63*  CALCIUM 9.7   GFR: Estimated Creatinine Clearance: 27.9 mL/min (A) (by C-G formula based on SCr of 1.63 mg/dL (H)). Liver Function Tests: Recent Labs  Lab 03/23/21 2146  AST 25  ALT 33  ALKPHOS 68  BILITOT 0.6  PROT 7.4  ALBUMIN 4.2   No results for input(s): LIPASE, AMYLASE in the last 168 hours. No results for input(s): AMMONIA in the last 168 hours. Coagulation Profile: No results for  input(s): INR, PROTIME in the last 168 hours. Cardiac Enzymes: No results for input(s): CKTOTAL, CKMB, CKMBINDEX, TROPONINI in the last 168 hours. BNP (last 3 results) Recent Labs    06/10/20 1508  PROBNP 2,349*   HbA1C: No results for input(s): HGBA1C in the last 72 hours. CBG: No results for input(s): GLUCAP in the last 168 hours. Lipid Profile: No results for input(s): CHOL, HDL, LDLCALC, TRIG, CHOLHDL, LDLDIRECT in the last 72 hours. Thyroid Function Tests: No results for input(s): TSH, T4TOTAL, FREET4, T3FREE, THYROIDAB in the last 72 hours. Anemia Panel: No results for input(s):  VITAMINB12, FOLATE, FERRITIN, TIBC, IRON, RETICCTPCT in the last 72 hours. Urine analysis: No results found for: COLORURINE, APPEARANCEUR, LABSPEC, [place], GLUCOSEU, [place], [place], KETONESUR, PROTEINUR, UROBILINOGEN, NITRITE, LEUKOCYTESUR  Radiological Exams on Admission: I have personally reviewed images DG Chest Port 1 View  Result Date: 03/23/2021 CLINICAL DATA:  Preoperative chest radiograph prior to hip surgery. EXAM: PORTABLE CHEST 1 VIEW COMPARISON:  05/04/2020 FINDINGS: Mild cardiomegaly and LEFT-sided pacemaker again noted. There is no evidence of focal airspace disease, pulmonary edema, suspicious pulmonary nodule/mass, pleural effusion, or pneumothorax. No acute bony abnormalities are identified. IMPRESSION: Mild cardiomegaly without evidence of acute cardiopulmonary disease. Electronically Signed   By: Margarette Canada M.D.   On: 03/23/2021 21:14   DG Hip Unilat With Pelvis 2-3 Views Left  Result Date: 03/23/2021 CLINICAL DATA:  Recent fall with hip pain, initial encounter EXAM: DG HIP (WITH OR WITHOUT PELVIS) 2-3V LEFT COMPARISON:  None. FINDINGS: Degenerative changes of the hip joints are noted left considerably worse than right. Undisplaced intratrochanteric left femoral fracture is noted. No soft tissue changes are seen. IMPRESSION: Left intratrochanteric fracture. Degenerative changes of left hip joint. Electronically Signed   By: Inez Catalina M.D.   On: 03/23/2021 21:09    EKG: I have personally reviewed EKG: no EKG  Assessment/Plan Principal Problem:   Closed left hip fracture (HCC) Active Problems:   Anemia due to stage 4 chronic kidney disease (HCC)   Atrial fibrillation (HCC)   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)   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)   Dyslipidemia   Coronary artery disease   Presence of Watchman left atrial appendage closure device   Pacemaker Medtronic device   Chronic systolic CHF  (congestive heart failure) (HCC)   OSA on CPAP   Gastroesophageal reflux    Assessment and Plan: * Closed left hip fracture ([place])- (present on admission) Admit to med/surg bed.  Patient luckily fell on the side that she is planning on getting a hip replacement in May 2023.  Patient is not on any antiplatelets including Plavix or systemic anticoagulants such as Eliquis due to history of recurrent GI bleeding.  Orthopedics has been notified the patient's hip fracture.  Keep her n.p.o. for potential surgery later today.  Gastroesophageal reflux- (present on admission) Continue Pepcid and Protonix.  OSA on CPAP Continue CPAP.  Chronic systolic CHF (congestive heart failure) ([place])- (present on admission) Euvolemic.  Continue Lasix.  Pacemaker Medtronic device- (present on admission) Chronic.  History of complete heart block.  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  Coronary artery disease- (present on admission) Is on Ranexa, Imdur.  she is on amiodarone.  she is not taking antiplatelets including aspirin or Plavix due to history of GI bleeding.  She follows regularly  with cardiology.  Dyslipidemia- (present on admission) Is on Repatha, Zetia, Lovaza, Pravachol.  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 ([place])- (present on admission) Stable.  She [facility] with nephrology in [facility].  Benign hypertension with CKD (chronic kidney disease) stage IV ([place])- (present on admission) Continue home medications.  Atrial fibrillation ([place])- (present on admission) Has a permanent pacemaker.  she is on amiodarone.  she has a Multimedia programmer.  She is not on anticoagulation due to history of GI bleeding.  Anemia due to stage 4 chronic kidney disease ([place])- (present on admission) Chronic   DVT prophylaxis: SCDs until surgery. Post-op DVT  prophylaxis per orthopedics Code Status: Full Code Family Communication: no family at bedside  Disposition Plan: return home vs SNF  Consults called: EDP has called orthopedics  Admission status: Inpatient, Med-Surg   Kristopher Oppenheim, DO [facility] 03/23/2021, 11:50 PM

## 2021-03-23 NOTE — Assessment & Plan Note (Addendum)
Chronic. Hemoglobin has dropped from 10.2 on 03/26/2021 to 8.7 on 03/28/2021. FOBT is pending.

## 2021-03-23 NOTE — Assessment & Plan Note (Addendum)
Has a permanent pacemaker.  she is on amiodarone.  She has a Multimedia programmer.  She is not on anticoagulation due to history of GI bleeding.

## 2021-03-23 NOTE — ED Provider Notes (Signed)
[facility] DEPT Provider Note   CSN: [id number] Arrival date & time: 03/23/21  2036     History  Chief Complaint  Patient presents with   Amanda [name] is a 81 y.o. female.  HPI She presents by EMS for injury to the left hip.  She states she was standing at her stone, without her walker, when she turned and fell injuring her left hip.  She denies other injuries including head, neck, back, right leg or arms.  She takes Plavix but no anticoagulants.  She denies other preceding symptoms.  She is due to have left hip replacement surgery, in May 2023 for an arthritic condition.    Home Medications Prior to Admission medications   Medication Sig Start Date End Date Taking? Authorizing Provider  albuterol (VENTOLIN HFA) 108 (90 Base) MCG/ACT inhaler Inhale 2 puffs into the lungs every 6 (six) hours as needed for wheezing or shortness of breath.    [provider]  amiodarone (PACERONE) 200 MG tablet TAKE 1 TABLET BY MOUTH DAILY. 03/04/21   Camnitz, Ocie Doyne, MD  BIOTIN PO Take 600 mcg by mouth 2 (two) times daily.    [provider]  carboxymethylcellul-glycerin (REFRESH RELIEVA) 0.5-0.9 % ophthalmic solution Place 1 drop into both eyes daily as needed for dry eyes.    [provider]  Cholecalciferol (VITAMIN D3) 50 MCG (2000 UT) TABS Take 2,000 Units by mouth daily at 12 noon.    [provider]  Coenzyme Q10 100 MG TABS Take 100 mg by mouth daily at 12 noon.     [provider]  estradiol (ESTRACE) 0.1 MG/GM vaginal cream Place 1 Applicatorful vaginally every Monday, Wednesday, and Friday.     [provider]  Evolocumab (REPATHA) 140 MG/ML SOSY Inject 140 mg into the skin every 14 (fourteen) days.    [provider]  ezetimibe (ZETIA) 10 MG tablet TAKE 1 TABLET BY MOUTH EVERY EVENING. Patient taking differently: Take 10 mg by mouth daily. 07/15/20   Park Liter, MD   famotidine (PEPCID) 40 MG tablet Take 40 mg by mouth daily. 05/06/20   [provider]  febuxostat (ULORIC) 40 MG tablet Take 40 mg by mouth at bedtime.    [provider]  ferrous sulfate 325 (65 FE) MG EC tablet Take 325 mg by mouth 2 (two) times daily with breakfast and lunch. 12/28/17   [provider]  folic acid (FOLVITE) 025 MCG tablet Take 800 mcg by mouth daily at 12 noon.    [provider]  furosemide (LASIX) 20 MG tablet Take 40 mg by mouth 2 (two) times daily with breakfast and lunch.    [provider]  Glucosamine-Chondroitin (OSTEO BI-FLEX REGULAR STRENGTH PO) Take 1 tablet by mouth daily at 12 noon. Unknown strength    [provider]  isosorbide mononitrate (IMDUR) 30 MG 24 hr tablet TAKE 1 TABLET BY MOUTH DAILY Patient taking differently: Take 30 mg by mouth daily. 08/12/20   Park Liter, MD  Lactobacillus [facility] ACIDOPHILUS) CAPS Take 1 capsule by mouth daily at 12 noon.    [provider]  levothyroxine (SYNTHROID, LEVOTHROID) 50 MCG tablet Take 50 mcg by mouth daily before breakfast.    [provider]  Multiple Vitamins-Minerals (PRESERVISION AREDS 2) CAPS Take 1 capsule by mouth 2 (two) times daily. Unknow strength    [provider]  nitroGLYCERIN (NITROSTAT) 0.4 MG SL tablet Place 0.4 mg under  the tongue every 5 (five) minutes as needed for chest pain.    [provider]  omega-3 acid ethyl esters (LOVAZA) 1 g capsule Take 2 capsules (2 g total) by mouth daily at 12 noon. 02/19/21   Revankar, Reita Cliche, MD  pantoprazole (PROTONIX) 40 MG tablet TAKE 1 TABLET BY MOUTH DAILY Patient taking differently: Take 40 mg by mouth daily. 04/30/20   Park Liter, MD  polyethylene glycol powder (GLYCOLAX/MIRALAX) powder Take 17 g by mouth every other day. Alternating between miralax and benefiber    [provider]  pravastatin (PRAVACHOL) 40 MG tablet Take 40 mg by mouth at  bedtime.    [provider]  ranolazine (RANEXA) 1000 MG SR tablet Take 1 tablet (1,000 mg total) by mouth 2 (two) times daily. 03/04/21   Park Liter, MD  traMADol (ULTRAM) 50 MG tablet Take 50 mg by mouth every 12 (twelve) hours as needed for severe pain.    [provider]  vitamin B-12 (CYANOCOBALAMIN) 1000 MCG tablet Take 1,000 mcg by mouth daily at 12 noon.    [provider]  zolpidem (AMBIEN) 5 MG tablet Take 5 mg by mouth at bedtime as needed for sleep. 06/10/20   [provider]      Allergies    Ciprofloxacin, Contrast media [iodinated contrast media], Nsaids, and Statins    Review of Systems   Review of Systems  Physical Exam Updated Vital Signs BP (!) 172/81    Pulse 64    Temp 97.9 F (36.6 C) (Oral)    Resp 14    Ht 5\' 3"  (1.6 m)    Wt 81.6 kg    SpO2 100%    BMI 31.89 kg/m  Physical Exam Vitals and nursing note reviewed.  Constitutional:      General: She is in acute distress (She is uncomfortable).     Appearance: She is well-developed. She is obese. She is not ill-appearing.  HENT:     Head: Normocephalic and atraumatic.     Right Ear: External ear normal.     Left Ear: External ear normal.  Eyes:     Conjunctiva/sclera: Conjunctivae normal.     Pupils: Pupils are equal, round, and reactive to light.  Neck:     Trachea: Phonation normal.  Cardiovascular:     Rate and Rhythm: Normal rate and regular rhythm.     Heart sounds: Normal heart sounds.     Comments: Hypertensive Pulmonary:     Effort: Pulmonary effort is normal. No respiratory distress.     Breath sounds: Normal breath sounds. No stridor.  Chest:     Chest wall: No tenderness.  Abdominal:     General: There is no distension.     Palpations: Abdomen is soft.     Tenderness: There is no abdominal tenderness.  Musculoskeletal:     Cervical back: Normal range of motion and neck supple.     Comments: She guards against movement of the left hip secondary to  pain.  No deformity of left hip or leg.  No injuries to arms or right leg.  Skin:    General: Skin is warm and dry.  Neurological:     Mental Status: She is alert and oriented to person, place, and time.     Cranial Nerves: No cranial nerve deficit.     Sensory: No sensory deficit.     Motor: No abnormal muscle tone.     Coordination: Coordination normal.  Psychiatric:  Mood and Affect: Mood normal.        Behavior: Behavior normal.        Thought Content: Thought content normal.        Judgment: Judgment normal.    ED Results / Procedures / Treatments   Labs (all labs ordered are listed, but only abnormal results are displayed) Labs Reviewed  RESP PANEL BY RT-PCR (FLU A&B, COVID) [id number]  COMPREHENSIVE METABOLIC PANEL  CBC WITH DIFFERENTIAL/PLATELET    EKG None  Radiology DG Chest Port 1 View  Result Date: 03/23/2021 CLINICAL DATA:  Preoperative chest radiograph prior to hip surgery. EXAM: PORTABLE CHEST 1 VIEW COMPARISON:  05/04/2020 FINDINGS: Mild cardiomegaly and LEFT-sided pacemaker again noted. There is no evidence of focal airspace disease, pulmonary edema, suspicious pulmonary nodule/mass, pleural effusion, or pneumothorax. No acute bony abnormalities are identified. IMPRESSION: Mild cardiomegaly without evidence of acute cardiopulmonary disease. Electronically Signed   By: Margarette Canada M.D.   On: 03/23/2021 21:14   DG Hip Unilat With Pelvis 2-3 Views Left  Result Date: 03/23/2021 CLINICAL DATA:  Recent fall with hip pain, initial encounter EXAM: DG HIP (WITH OR WITHOUT PELVIS) 2-3V LEFT COMPARISON:  None. FINDINGS: Degenerative changes of the hip joints are noted left considerably worse than right. Undisplaced intratrochanteric left femoral fracture is noted. No soft tissue changes are seen. IMPRESSION: Left intratrochanteric fracture. Degenerative changes of left hip joint. Electronically Signed   By: Inez Catalina M.D.   On: 03/23/2021 21:09     Procedures Procedures    Medications Ordered in ED Medications - No data to display  ED Course/ Medical Decision Making/ A&P Clinical Course as of 03/23/21 2314  Sun Mar 23, 2021  2102 DG Hip Unilat With Pelvis 2-3 Views Left [EW]  2226 Reassessed patient, she remains fairly uncomfortable.  Narcotic pain reliever ordered.  She states no one is coming in to see her this evening.  I updated her on the plans. [EW]    Clinical Course User Index [EW] Daleen Bo, MD                           Medical Decision Making Patient with fall due to tripping suddenly, injured left hip when fell.  No other injuries.  She takes Plavix but no anticoagulants.  Problems Addressed: Closed fracture of left hip, initial encounter [facility]): acute illness or injury Fall, initial encounter: acute illness or injury    Details: Suspected to be essentially mechanical.  Amount and/or Complexity of Data Reviewed Independent Historian:     Details: She is a cogent historian External Data Reviewed: notes.    Details: Cardiology visit 01/20/2021, noted to have paroxysmal atrial fibrillation, treated with rate control medications and Plavix, but not anticoagulants due to history of GI bleeding. Labs: ordered.    Details: CBC, metabolic panel, viral panel Radiology: ordered and independent interpretation performed. Decision-making details documented in ED Course.    Details: Pelvis, left hip, chest x-ray: Nondisplaced left intertrochanteric proximal femur fracture, no chest abnormality. ECG/medicine tests: ordered.    Details: Normal sinus rhythm Discussion of management or test interpretation with external provider(s): Consultation orthopedics, Dr. Alvan Dame returned the call, he will have someone from his practice see the patient tomorrow.  He requests that the patient be kept off Plavix and anticipates operative intervention in about 48 hours.  Consultation on-call hospitalist to perform medical clearance and  arrange for admission.  Risk Prescription drug management. Decision regarding hospitalization. Elective  major surgery with no identified risk factors. Risk Details: Patient with hip fracture from mechanical fall, requiring operative intervention.  She will need to be stabilized and had medical clearance by admitting hospitalist Dr.  Critical Care Total time providing critical care: 30-74 minutes          Final Clinical Impression(s) / ED Diagnoses Final diagnoses:  Fall, initial encounter  Closed fracture of left hip, initial encounter [facility])    Rx / DC Orders ED Discharge Orders     None         Daleen Bo, MD 03/24/21 [phone]

## 2021-03-23 NOTE — ED Triage Notes (Signed)
Patient from home. Patient was going into the kitchen when she tripped and lost her footing. Patient complaining of left hip pain, no obvious deformities. Patient is supposed to get a hip replacement this year.     156/86 100 89

## 2021-03-23 NOTE — Assessment & Plan Note (Signed)
Is on Repatha, Zetia, Lovaza, Pravachol.

## 2021-03-23 NOTE — Assessment & Plan Note (Signed)
Euvolemic.  Continue Lasix.

## 2021-03-23 NOTE — Assessment & Plan Note (Signed)
Chronic.  History of complete heart block.

## 2021-03-23 NOTE — Assessment & Plan Note (Signed)
Stable.  She follows with nephrology in [facility].

## 2021-03-23 NOTE — Assessment & Plan Note (Signed)
Continue CPAP.  

## 2021-03-23 NOTE — Assessment & Plan Note (Addendum)
Continue home medications. Pt blood pressures were quite high overnight. She is receiving imdur 30 mg daily and hydralazine 50 mg every six hours.daily. I have added metoprolol 12.5 mg bid. I will add amlodipine as blood pressure is down a little bit.

## 2021-03-23 NOTE — Assessment & Plan Note (Addendum)
Admit to med/surg bed.  Patient luckily fell on the side that she is planning on getting a hip replacement in May 2023.  Patient is not on any antiplatelets including Plavix or systemic anticoagulants such as Eliquis due to history of recurrent GI bleeding.  The patient underwent ORIF of hip on 03/24/2021. She has been doing well with PT. The patient has had BM's in the last 24 hours. One smaller and one larger. She would be discharged to home today with home health. However, today the family has expressed a wish for the patient to go to SNF. I have relayed this wish to [facility] and they are working on it.

## 2021-03-23 NOTE — Assessment & Plan Note (Signed)
-   Continue Pepcid and Protonix 

## 2021-03-23 NOTE — Assessment & Plan Note (Signed)
Is on Ranexa, Imdur.  she is on amiodarone.  she is not taking antiplatelets including aspirin or Plavix due to history of GI bleeding.  She follows regularly with cardiology.

## 2021-03-23 NOTE — Subjective & Objective (Signed)
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  Patient states she was at the stove.  She tried to turn around.  She lost her balance.  She landed on her left hip.  She had immediate pain.  Patient brought to the ER.  Vital signs temp 97.9 heart rate 64 blood pressure 172/81 satting 100% on room air  X-ray showed a intertrochanteric fracture on the left side.  Chest x-ray was negative.  Laboratory evaluation COVID test was negative.  Sodium 139, potassium 4.9, BUN of 38, creatinine 2.6, glucose 234  white count 5.6, hemoglobin 13.3, platelets 206  No EKG performed that the patient has a permanent pacemaker.  EDP is notified orthopedics.  [facility] hospitalist contacted for admission.

## 2021-03-24 LAB — CBG MONITORING, ED
Glucose-Capillary: 146 mg/dL — ABNORMAL HIGH (ref 70–99)
Glucose-Capillary: 194 mg/dL — ABNORMAL HIGH (ref 70–99)

## 2021-03-24 LAB — GLUCOSE, CAPILLARY: Glucose-Capillary: 189 mg/dL — ABNORMAL HIGH (ref 70–99)

## 2021-03-24 MED ORDER — HYDROMORPHONE HCL 1 MG/ML IJ SOLN
0.5000 mg | INTRAMUSCULAR | Status: DC | PRN
  Administered 2021-03-24 – 2021-03-25 (×3): 0.5 mg via INTRAVENOUS
  Filled 2021-03-24: qty 1
  Filled 2021-03-24: qty 0.5
  Filled 2021-03-24: qty 1
  Filled 2021-03-24: qty 0.5

## 2021-03-24 MED ORDER — POVIDONE-IODINE 10 % EX SWAB: 2.0000 "application " | Freq: Once | CUTANEOUS | Status: DC

## 2021-03-24 MED ORDER — AMIODARONE HCL 200 MG PO TABS
200.0000 mg | ORAL_TABLET | Freq: Every day | ORAL | Status: DC
  Administered 2021-03-24 – 2021-03-31 (×8): 200 mg via ORAL
  Filled 2021-03-24 (×8): qty 1

## 2021-03-24 MED ORDER — FEBUXOSTAT 40 MG PO TABS: 40.0000 mg | ORAL_TABLET | Freq: Every day | ORAL | Status: DC

## 2021-03-24 MED ORDER — BISACODYL 5 MG PO TBEC: 5.0000 mg | DELAYED_RELEASE_TABLET | Freq: Every day | ORAL | Status: DC | PRN

## 2021-03-24 MED ORDER — FAMOTIDINE 20 MG PO TABS
40.0000 mg | ORAL_TABLET | Freq: Every day | ORAL | Status: DC
  Administered 2021-03-24 – 2021-03-25 (×2): 40 mg via ORAL
  Filled 2021-03-24 (×2): qty 2

## 2021-03-24 MED ORDER — INSULIN ASPART 100 UNIT/ML IJ SOLN
0.0000 [IU] | Freq: Three times a day (TID) | INTRAMUSCULAR | Status: DC
  Administered 2021-03-24: 17:00:00 2 [IU] via SUBCUTANEOUS
  Administered 2021-03-24: 14:00:00 1 [IU] via SUBCUTANEOUS
  Administered 2021-03-25: 2 [IU] via SUBCUTANEOUS
  Administered 2021-03-26: 5 [IU] via SUBCUTANEOUS
  Administered 2021-03-26: 2 [IU] via SUBCUTANEOUS
  Administered 2021-03-26: 7 [IU] via SUBCUTANEOUS
  Administered 2021-03-27 – 2021-03-28 (×4): 3 [IU] via SUBCUTANEOUS
  Administered 2021-03-28: 5 [IU] via SUBCUTANEOUS
  Administered 2021-03-28: 3 [IU] via SUBCUTANEOUS
  Administered 2021-03-29: 7 [IU] via SUBCUTANEOUS
  Administered 2021-03-29 (×2): 2 [IU] via SUBCUTANEOUS
  Administered 2021-03-30: 3 [IU] via SUBCUTANEOUS
  Administered 2021-03-30 – 2021-03-31 (×3): 2 [IU] via SUBCUTANEOUS
  Administered 2021-03-31: 3 [IU] via SUBCUTANEOUS
  Filled 2021-03-24: qty 0.09

## 2021-03-24 MED ORDER — RISAQUAD PO CAPS
1.0000 | ORAL_CAPSULE | Freq: Every day | ORAL | Status: DC
  Administered 2021-03-26 – 2021-03-31 (×6): 1 via ORAL
  Filled 2021-03-24 (×8): qty 1

## 2021-03-24 MED ORDER — OMEGA-3-ACID ETHYL ESTERS 1 G PO CAPS
2.0000 g | ORAL_CAPSULE | Freq: Every day | ORAL | Status: DC
  Administered 2021-03-26 – 2021-03-31 (×6): 2 g via ORAL
  Filled 2021-03-24 (×8): qty 2

## 2021-03-24 MED ORDER — RANOLAZINE ER 500 MG PO TB12
1000.0000 mg | ORAL_TABLET | Freq: Two times a day (BID) | ORAL | Status: DC
  Administered 2021-03-24 – 2021-03-25 (×4): 1000 mg via ORAL
  Filled 2021-03-24 (×5): qty 2

## 2021-03-24 MED ORDER — ISOSORBIDE MONONITRATE ER 30 MG PO TB24
30.0000 mg | ORAL_TABLET | Freq: Every day | ORAL | Status: DC
  Administered 2021-03-24 – 2021-03-30 (×7): 30 mg via ORAL
  Filled 2021-03-24 (×8): qty 1

## 2021-03-24 MED ORDER — DEXAMETHASONE SODIUM PHOSPHATE 10 MG/ML IJ SOLN
8.0000 mg | Freq: Once | INTRAMUSCULAR | Status: AC
  Administered 2021-03-25: 8 mg via INTRAVENOUS

## 2021-03-24 MED ORDER — POLYETHYLENE GLYCOL 3350 17 G PO PACK
17.0000 g | PACK | ORAL | Status: DC
  Administered 2021-03-24 – 2021-03-28 (×3): 17 g via ORAL
  Filled 2021-03-24 (×4): qty 1

## 2021-03-24 MED ORDER — LEVOTHYROXINE SODIUM 50 MCG PO TABS
50.0000 ug | ORAL_TABLET | Freq: Every day | ORAL | Status: DC
  Administered 2021-03-24 – 2021-03-31 (×7): 50 ug via ORAL
  Filled 2021-03-24 (×7): qty 1

## 2021-03-24 MED ORDER — FEBUXOSTAT 40 MG PO TABS
40.0000 mg | ORAL_TABLET | Freq: Every day | ORAL | Status: DC
  Administered 2021-03-24 – 2021-03-30 (×8): 40 mg via ORAL
  Filled 2021-03-24 (×9): qty 1

## 2021-03-24 MED ORDER — OXYCODONE HCL 5 MG PO TABS
5.0000 mg | ORAL_TABLET | ORAL | Status: DC | PRN
  Administered 2021-03-24: 5 mg via ORAL
  Administered 2021-03-24: 10 mg via ORAL
  Administered 2021-03-24: 5 mg via ORAL
  Administered 2021-03-24 – 2021-03-25 (×2): 10 mg via ORAL
  Filled 2021-03-24 (×2): qty 2
  Filled 2021-03-24: qty 1
  Filled 2021-03-24: qty 2
  Filled 2021-03-24: qty 1

## 2021-03-24 MED ORDER — CHLORHEXIDINE GLUCONATE 4 % EX LIQD
60.0000 mL | Freq: Once | CUTANEOUS | Status: AC
  Administered 2021-03-25: 4 via TOPICAL
  Filled 2021-03-24: qty 15

## 2021-03-24 MED ORDER — EZETIMIBE 10 MG PO TABS
10.0000 mg | ORAL_TABLET | Freq: Every evening | ORAL | Status: DC
  Administered 2021-03-24 – 2021-03-30 (×6): 10 mg via ORAL
  Filled 2021-03-24 (×8): qty 1

## 2021-03-24 MED ORDER — PANTOPRAZOLE SODIUM 40 MG PO TBEC
40.0000 mg | DELAYED_RELEASE_TABLET | Freq: Every day | ORAL | Status: DC
  Administered 2021-03-24 – 2021-03-31 (×8): 40 mg via ORAL
  Filled 2021-03-24 (×8): qty 1

## 2021-03-24 MED ORDER — METHOCARBAMOL 500 MG PO TABS
500.0000 mg | ORAL_TABLET | Freq: Three times a day (TID) | ORAL | Status: DC | PRN
  Administered 2021-03-24: 500 mg via ORAL
  Filled 2021-03-24: qty 1

## 2021-03-24 MED ORDER — LACTATED RINGERS IV SOLN: INTRAVENOUS | Status: DC

## 2021-03-24 MED ORDER — FLORAJEN ACIDOPHILUS PO CAPS: 1.0000 | ORAL_CAPSULE | Freq: Every day | ORAL | Status: DC

## 2021-03-24 MED ORDER — TRANEXAMIC ACID-NACL 1000-0.7 MG/100ML-% IV SOLN
1000.0000 mg | INTRAVENOUS | Status: AC
  Administered 2021-03-25: 1000 mg via INTRAVENOUS
  Filled 2021-03-24: qty 100

## 2021-03-24 MED ORDER — INSULIN ASPART 100 UNIT/ML IJ SOLN
0.0000 [IU] | Freq: Every day | INTRAMUSCULAR | Status: DC
  Administered 2021-03-25: 2 [IU] via SUBCUTANEOUS
  Administered 2021-03-26: 5 [IU] via SUBCUTANEOUS
  Administered 2021-03-27 – 2021-03-28 (×2): 3 [IU] via SUBCUTANEOUS
  Filled 2021-03-24: qty 0.05

## 2021-03-24 MED ORDER — SODIUM CHLORIDE 0.9 % IV SOLN: INTRAVENOUS | Status: DC

## 2021-03-24 MED ORDER — FUROSEMIDE 40 MG PO TABS
40.0000 mg | ORAL_TABLET | Freq: Two times a day (BID) | ORAL | Status: DC
  Administered 2021-03-24 (×2): 40 mg via ORAL
  Filled 2021-03-24 (×2): qty 1

## 2021-03-24 MED ORDER — PRAVASTATIN SODIUM 40 MG PO TABS
40.0000 mg | ORAL_TABLET | Freq: Every day | ORAL | Status: DC
  Administered 2021-03-24 – 2021-03-30 (×8): 40 mg via ORAL
  Filled 2021-03-24: qty 1
  Filled 2021-03-24: qty 2
  Filled 2021-03-24: qty 1
  Filled 2021-03-24: qty 2
  Filled 2021-03-24 (×4): qty 1

## 2021-03-24 MED ORDER — DOCUSATE SODIUM 100 MG PO CAPS
100.0000 mg | ORAL_CAPSULE | Freq: Two times a day (BID) | ORAL | Status: DC
  Administered 2021-03-24 – 2021-03-25 (×4): 100 mg via ORAL
  Filled 2021-03-24 (×4): qty 1

## 2021-03-24 MED ORDER — CEFAZOLIN SODIUM-DEXTROSE 2-4 GM/100ML-% IV SOLN
2.0000 g | INTRAVENOUS | Status: AC
  Administered 2021-03-25: 2 g via INTRAVENOUS
  Filled 2021-03-24: qty 100

## 2021-03-24 NOTE — Progress Notes (Signed)
Progress Note    Amanda [name]  ZDG:[id number] DOB: 09-12-1940  DOA: 03/23/2021 PCP: Venetia Maxon, Sharon Mt, MD    Brief Narrative:     Medical records reviewed and are as summarized below:  Amanda [name] is an 81 y.o. female with a history of A-fib, history of complete heart block status post permanent pacemaker, history of Watchman device, history of CKD stage IV baseline creatinine 1.7-2.0, anemia chronic kidney disease, type 2 diabetes, coronary disease, chronic systolic heart failure, OSA on CPAP, left hip osteoarthritis presents the ER today after a fall in the kitchen.  Patient states she was at the stove.  She tried to turn around.  She lost her balance.  She landed on her left hip.  She had immediate pain.  Found to have a hip fracture.  Plan for surgery on 2/21.     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   -Orthopedics has been notified the patient's hip fracture.   -NPO after midnight- plan for surgery on 2/21   Gastroesophageal reflux- (present on admission) Continue Pepcid and Protonix.   OSA on CPAP Continue CPAP.   Chronic systolic CHF (congestive heart failure) ([place])- (present on admission) Euvolemic -on lasix   Pacemaker Medtronic device- (present on admission) Chronic.   History of complete heart block.   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   Coronary artery disease- (present on admission) Is on Ranexa, Imdur.  she is on amiodarone.  she is not taking antiplatelets including aspirin or Plavix due to history of GI bleeding.  She follows regularly with cardiology.   Dyslipidemia- (present on admission) Is on Repatha, Zetia, Lovaza, Pravachol.   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   Atrial fibrillation ([place])- (present on admission) Has a permanent pacemaker.  she is on amiodarone.  she has a Multimedia programmer.  She is not on anticoagulation due to history of GI bleeding. -hgb 13.3  obesity Body mass index is 31.89 kg/m.   Family Communication/Anticipated D/C date and plan/Code Status   DVT prophylaxis: per ortho Code Status: Full Code.  Family Communication: at bedside Disposition Plan: Status is: Inpatient              Medical Consultants:   ortho     Subjective:   Denies chest pain or SOB  Objective:    Vitals:   03/24/21 0500 03/24/21 0600 03/24/21 0800 03/24/21 0900  BP: (!) 157/62 (!) 164/67 (!) 180/77 (!) 179/76  Pulse: 66 68 69 66  Resp: 13 14 18  13  Temp:      TempSrc:      SpO2: 95% 94% (!) 89% 95%  Weight:      Height:        Intake/Output Summary (Last 24 hours) at 03/24/2021 1018 Last data filed at 03/24/2021 1017 Gross per 24 hour  Intake --  Output 900 ml  Net -900 ml   Filed Weights   03/23/21 2043  Weight: 81.6 kg    Exam:  General: Appearance:    Obese female in no acute distress     Lungs:     respirations unlabored  Heart:    Normal heart rate.   MS:   All extremities  are intact.  Chronic venous stasis of LE   Neurologic:   Awake, alert, oriented x 3     Data Reviewed:   I have personally reviewed following labs and imaging studies:  Labs: Labs show the following:   Basic Metabolic Panel: Recent Labs  Lab 03/23/21 2146  NA 139  K 4.9  CL 103  CO2 28  GLUCOSE 234*  BUN 38*  CREATININE 1.63*  CALCIUM 9.7   GFR Estimated Creatinine Clearance: 27.9 mL/min (A) (by C-G formula based on SCr of 1.63 mg/dL (H)). Liver Function Tests: Recent Labs  Lab 03/23/21 2146  AST 25  ALT 33  ALKPHOS 68  BILITOT 0.6  PROT 7.4  ALBUMIN 4.2   No results for input(s): LIPASE, AMYLASE in the last 168 hours. No results for input(s): AMMONIA in the last 168 hours. Coagulation profile No results for input(s): INR, PROTIME in the last 168 hours.  CBC: Recent Labs  Lab 03/23/21 2146  WBC 5.6  NEUTROABS 4.0  HGB 13.3  HCT 41.3  MCV 99.5  PLT 206   Cardiac Enzymes: No results for input(s): CKTOTAL, CKMB, CKMBINDEX, TROPONINI in the last 168 hours. BNP (last 3 results) Recent Labs    06/10/20 1508  PROBNP 2,349*   CBG: No results for input(s): GLUCAP in the last 168 hours. D-Dimer: No results for input(s): DDIMER in the last 72 hours. Hgb A1c: No results for input(s): HGBA1C in the last 72 hours. Lipid Profile: No results for input(s): CHOL, HDL, LDLCALC, TRIG, CHOLHDL, LDLDIRECT in the last 72 hours. Thyroid function studies: No results for input(s): TSH, T4TOTAL, T3FREE, THYROIDAB in the last 72 hours.  Invalid input(s): FREET3 Anemia work up: No results for input(s): VITAMINB12, FOLATE, FERRITIN, TIBC, IRON, RETICCTPCT in the last 72 hours. Sepsis Labs: Recent Labs  Lab 03/23/21 2146  WBC 5.6    Microbiology Recent Results (from the past 240 hour(s))  Resp Panel by RT-PCR (Flu A&B, Covid) Nasopharyngeal Swab     Status: None   Collection Time: 03/23/21  9:46 PM   Specimen: Nasopharyngeal Swab; Nasopharyngeal(NP) swabs in  vial transport medium  Result Value Ref Range Status   SARS Coronavirus 2 by RT PCR NEGATIVE NEGATIVE Final    Comment: (NOTE) SARS-CoV-2 target nucleic acids are NOT DETECTED.  The SARS-CoV-2 RNA is generally detectable in upper respiratory specimens during the acute phase of infection. The lowest concentration of SARS-CoV-2 viral copies this assay can detect is 138 copies/mL. A negative result does not preclude SARS-Cov-2 infection and should not be used as the sole basis for treatment or other patient management decisions. A negative result may occur with  improper specimen collection/handling, submission of specimen other than nasopharyngeal swab, presence of viral mutation(s) within the areas targeted by this assay, and inadequate number of viral copies(<138 copies/mL).  A negative result must be combined with clinical observations, patient history, and epidemiological information. The expected result is Negative.  Fact Sheet for Patients:  EntrepreneurPulse.com.au  Fact Sheet for Healthcare Providers:  IncredibleEmployment.be  This test is no t yet approved or cleared by the [place] FDA and  has been authorized for detection and/or diagnosis of SARS-CoV-2 by FDA under an Emergency Use Authorization (EUA). This EUA will remain  in effect (meaning this test can be used) for the duration of the COVID-19 declaration under Section 564(b)(1) of the Act, 21 U.S.C.section 360bbb-3(b)(1), unless the authorization is terminated  or revoked sooner.       Influenza A by PCR NEGATIVE NEGATIVE Final   Influenza B by PCR NEGATIVE NEGATIVE Final    Comment: (NOTE) The Xpert Xpress SARS-CoV-2/FLU/RSV plus assay is intended as an aid in the diagnosis of influenza from Nasopharyngeal swab specimens and should not be used as a sole basis for treatment. Nasal washings and aspirates are unacceptable for Xpert Xpress SARS-CoV-2/FLU/RSV testing.  Fact  Sheet for Patients: EntrepreneurPulse.com.au  Fact Sheet for Healthcare Providers: IncredibleEmployment.be  This test is not yet approved or cleared by the [place] FDA and has been authorized for detection and/or diagnosis of SARS-CoV-2 by FDA under an Emergency Use Authorization (EUA). This EUA will remain in effect (meaning this test can be used) for the duration of the COVID-19 declaration under Section 564(b)(1) of the Act, 21 U.S.C. section 360bbb-3(b)(1), unless the authorization is terminated or revoked.  Performed at [facility], [place] [address] [place], [place] [address]     Procedures and diagnostic studies:  DG Chest Port 1 View  Result Date: 03/23/2021 CLINICAL DATA:  Preoperative chest radiograph prior to hip surgery. EXAM: PORTABLE CHEST 1 VIEW COMPARISON:  05/04/2020 FINDINGS: Mild cardiomegaly and LEFT-sided pacemaker again noted. There is no evidence of focal airspace disease, pulmonary edema, suspicious pulmonary nodule/mass, pleural effusion, or pneumothorax. No acute bony abnormalities are identified. IMPRESSION: Mild cardiomegaly without evidence of acute cardiopulmonary disease. Electronically Signed   By: Margarette Canada M.D.   On: 03/23/2021 21:14   DG Hip Unilat With Pelvis 2-3 Views Left  Result Date: 03/23/2021 CLINICAL DATA:  Recent fall with hip pain, initial encounter EXAM: DG HIP (WITH OR WITHOUT PELVIS) 2-3V LEFT COMPARISON:  None. FINDINGS: Degenerative changes of the hip joints are noted left considerably worse than right. Undisplaced intratrochanteric left femoral fracture is noted. No soft tissue changes are seen. IMPRESSION: Left intratrochanteric fracture. Degenerative changes of left hip joint. Electronically Signed   By: Inez Catalina M.D.   On: 03/23/2021 21:09    Medications:    acidophilus  1 capsule Oral Q1200   amiodarone  200 mg Oral Daily   docusate sodium  100 mg Oral BID    ezetimibe  10 mg Oral QPM   famotidine  40 mg Oral Daily   febuxostat  40 mg Oral QHS   furosemide  40 mg Oral BID WC   isosorbide mononitrate  30 mg Oral QHS   levothyroxine  50 mcg Oral QAC breakfast   omega-3 acid ethyl esters  2 g Oral Q1200   pantoprazole  40 mg Oral Daily   polyethylene glycol  17 g Oral QODAY   pravastatin  40 mg Oral QHS   ranolazine  1,000 mg Oral BID   Continuous Infusions:  sodium chloride     lactated ringers 10 mL/hr at 03/24/21 0104     LOS: 1 day  Geradine Girt  [facility]   How to contact the [facility] Attending or Consulting provider [place] or covering provider during after hours [place], for this patient?  Check the care team in [facility] and look for a) attending/consulting [facility] provider listed and b) the [facility] team listed Log into [email or web address] and use [place]'s universal password to access. If you do not have the password, please contact the hospital operator. Locate the [facility] provider you are looking for under [facility] and page to a number that you can be directly reached. If you still have difficulty reaching the provider, please page the [facility] (Director on Call) for the Hospitalists listed on amion for assistance.  03/24/2021, 10:18 AM

## 2021-03-24 NOTE — H&P (View-Only) (Signed)
Reason for Consult:left intertrochanteric hip fracture+ Referring Physician: Eliseo Squires. MD (Hospitalist)  Amanda [name] is an 81 y.o. female.  HPI: 81 year old female with a history of A-fib, history of complete heart block status post permanent pacemaker, history of Watchman device, history of CKD stage IV baseline creatinine 1.7-2.0, anemia chronic kidney disease, type 2 diabetes, coronary disease, chronic systolic heart failure, OSA on CPAP, left hip osteoarthritis presents the ER today after a fall in the kitchen.  Patient states she was at the stove.  She tried to turn around.  She lost her balance.  She landed on her left hip.  She had immediate pain.  Patient brought to the ER.  Past Medical History:  Diagnosis Date   Acute ischemic stroke ([place])    Acute metabolic encephalopathy    Acute on chronic systolic (congestive) heart failure (HCC)    Acute renal insufficiency 09/02/2016   Anemia due to stage 4 chronic kidney disease ([place]) 04/04/2015   Atrial fibrillation (HCC)    Benign hypertension with CKD (chronic kidney disease) stage IV ([place]) 04/04/2015   Bradycardia 09/17/2014   Chest pain 06/23/2016   Overview:  Added automatically from request for surgery [id number]   Chronic anemia    Chronic kidney disease (CKD), stage III (moderate) ([place])    Coronary artery disease 10/12/2016   Non drug-eluting stent implanted in June 2018 to mid RCA   08/08/2016 10:37  Angiographic Findings  Cardiac Arteries and Lesion Findings LMCA: 0% and Normal. LAD: 0% and Normal. RCA:   Lesion on Mid RCA: Mid subsection.95% stenosis reduced to 0%. Pre procedure   TIMI II flow was noted. Post Procedure TIMI III flow was present. Poor run   off was present. The lesion was diagnosed as High Risk (C).    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 ([place]) 04/04/2015   Diabetes mellitus without complication ([place])    Diverticulosis    Dyslipidemia 09/17/2014   Gastroesophageal reflux    GI bleed 08/06/2016   Heart  block AV complete ([place]) 09/05/2019   History of cardiac monitoring 02/23/2018   monitor inserted   Hyperlipidemia    Hypertensive heart disease with heart failure (HCC)    Iron deficiency anemia 12/28/2017   LV dysfunction 09/02/2016   Myocardial infarction [facility])    NSTEMI (non-ST elevated myocardial infarction) ([place]) 08/06/2016   Obstructive sleep apnea 09/17/2014   Orthostatic hypotension 12/28/2017   OSA on CPAP    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   Postural dizziness with presyncope 09/23/2017   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/24/2017   Recurrent left pleural effusion 03/14/2015   Renal failure, chronic, stage 3 (moderate) ([place])    Syncope 75/10/1636   Systolic heart failure ([place])    Thyroid disease    Vitamin D deficiency 04/04/2015    Past Surgical History:  Procedure Laterality Date   BUBBLE STUDY  05/16/2020   Procedure: BUBBLE STUDY;  Surgeon: Elouise Munroe, MD;  Location: [place];  Service: Cardiovascular;;   CARDIAC CATHETERIZATION     CARDIOVERSION N/A 05/16/2020   Procedure: CARDIOVERSION;  Surgeon: Elouise Munroe, MD;  Location: [place];  Service: Cardiovascular;  Laterality: N/A;   CARDIOVERSION N/A 09/06/2020   Procedure: CARDIOVERSION;  Surgeon: Donato Heinz, MD;  Location: [facility] ENDOSCOPY;  Service: Cardiovascular;  Laterality: N/A;   COLONOSCOPY  11/21/2014   Moderate predominantly sigmoid diverticulosis. Otherwise noraml collonscopy to TI.    CORONARY ANGIOPLASTY WITH STENT PLACEMENT  08/2016  EXCISIONAL HEMORRHOIDECTOMY     LEFT ATRIAL APPENDAGE OCCLUSION  11/2016   in [place] N/A 02/23/2018   Procedure: [place];  Surgeon: Constance Haw, MD;  Location: [place] CV LAB;  Service: Cardiovascular;  Laterality: N/A;   LOOP RECORDER REMOVAL N/A 04/24/2019   Procedure: LOOP RECORDER REMOVAL;  Surgeon: Constance Haw, MD;  Location: [place] CV LAB;  Service:  Cardiovascular;  Laterality: N/A;   NOSE SURGERY     PACEMAKER IMPLANT N/A 04/24/2019   Procedure: PACEMAKER IMPLANT;  Surgeon: Constance Haw, MD;  Location: [place] CV LAB;  Service: Cardiovascular;  Laterality: N/A;   TEE WITHOUT CARDIOVERSION N/A 05/16/2020   Procedure: TRANSESOPHAGEAL ECHOCARDIOGRAM (TEE);  Surgeon: Elouise Munroe, MD;  Location: [facility] ENDOSCOPY;  Service: Cardiovascular;  Laterality: N/A;    Family History  Problem Relation Age of Onset   Breast cancer Mother    Hypertension Father    Prostate cancer Father    Stroke Father    Diabetes Sister    Heart attack Sister    Heart attack Brother    Heart attack Maternal Uncle    Cancer Maternal Grandfather        not sure if it is colon or rectal    Esophageal cancer Neg Hx     Social History:  reports that she has quit smoking. She quit smokeless tobacco use about 26 years ago. She reports that she does not currently use alcohol after a past usage of about 2.0 standard drinks per week. She reports that she does not use drugs.  Allergies:  Allergies  Allergen Reactions   Ciprofloxacin Other (See Comments)    Achilles tendon pain   Contrast Media [Iodinated Contrast Media]     Avoid due to stage 4 kidney disease    Nsaids     Avoid due to stage 4 kidney disease    Statins Other (See Comments)    Joint pain, tolerates pravastatin     Medications: I have reviewed the patient's current medications. Scheduled:  acidophilus  1 capsule Oral Q1200   amiodarone  200 mg Oral Daily   docusate sodium  100 mg Oral BID   ezetimibe  10 mg Oral QPM   famotidine  40 mg Oral Daily   febuxostat  40 mg Oral QHS   furosemide  40 mg Oral BID WC   insulin aspart  0-5 Units Subcutaneous QHS   insulin aspart  0-9 Units Subcutaneous TID WC   isosorbide mononitrate  30 mg Oral QHS   levothyroxine  50 mcg Oral QAC breakfast   omega-3 acid ethyl esters  2 g Oral Q1200   pantoprazole  40 mg Oral Daily   polyethylene  glycol  17 g Oral QODAY   pravastatin  40 mg Oral QHS   ranolazine  1,000 mg Oral BID    Results for orders placed or performed during the hospital encounter of 03/23/21 (from the past 24 hour(s))  Resp Panel by RT-PCR (Flu A&B, Covid) Nasopharyngeal Swab     Status: None   Collection Time: 03/23/21  9:46 PM   Specimen: Nasopharyngeal Swab; Nasopharyngeal(NP) swabs in vial transport medium  Result Value Ref Range   SARS Coronavirus 2 by RT PCR NEGATIVE NEGATIVE   Influenza A by PCR NEGATIVE NEGATIVE   Influenza B by PCR NEGATIVE NEGATIVE  Comprehensive metabolic panel     Status: Abnormal   Collection Time: 03/23/21  9:46 PM  Result Value Ref Range   Sodium 139 135 - 145 mmol/L   Potassium 4.9 3.5 - 5.1 mmol/L   Chloride 103 98 - 111 mmol/L   CO2 28 22 - 32 mmol/L   Glucose, Bld 234 (H) 70 - 99 mg/dL   BUN 38 (H) 8 - 23 mg/dL   Creatinine, Ser 1.63 (H) 0.44 - 1.00 mg/dL   Calcium 9.7 8.9 - 10.3 mg/dL   Total Protein 7.4 6.5 - 8.1 g/dL   Albumin 4.2 3.5 - 5.0 g/dL   AST 25 15 - 41 U/L   ALT 33 0 - 44 U/L   Alkaline Phosphatase 68 38 - 126 U/L   Total Bilirubin 0.6 0.3 - 1.2 mg/dL   GFR, Estimated 32 (L) >60 mL/min   Anion gap 8 5 - 15  CBC with Differential     Status: None   Collection Time: 03/23/21  9:46 PM  Result Value Ref Range   WBC 5.6 4.0 - 10.5 K/uL   RBC 4.15 3.87 - 5.11 MIL/uL   Hemoglobin 13.3 12.0 - 15.0 g/dL   HCT 41.3 36.0 - 46.0 %   MCV 99.5 80.0 - 100.0 fL   MCH 32.0 26.0 - 34.0 pg   MCHC 32.2 30.0 - 36.0 g/dL   RDW 12.4 11.5 - 15.5 %   Platelets 206 150 - 400 K/uL   nRBC 0.0 0.0 - 0.2 %   Neutrophils Relative % 71 %   Neutro Abs 4.0 1.7 - 7.7 K/uL   Lymphocytes Relative 14 %   Lymphs Abs 0.8 0.7 - 4.0 K/uL   Monocytes Relative 9 %   Monocytes Absolute 0.5 0.1 - 1.0 K/uL   Eosinophils Relative 5 %   Eosinophils Absolute 0.3 0.0 - 0.5 K/uL   Basophils Relative 0 %   Basophils Absolute 0.0 0.0 - 0.1 K/uL   Immature Granulocytes 1 %   Abs Immature  Granulocytes 0.03 0.00 - 0.07 K/uL  CBG monitoring, ED     Status: Abnormal   Collection Time: 03/24/21  2:15 PM  Result Value Ref Range   Glucose-Capillary 146 (H) 70 - 99 mg/dL    X-ray: CLINICAL DATA:  Recent fall with hip pain, initial encounter   EXAM: DG HIP (WITH OR WITHOUT PELVIS) 2-3V LEFT   COMPARISON:  None.   FINDINGS: Degenerative changes of the hip joints are noted left considerably worse than right. Undisplaced intratrochanteric left femoral fracture is noted. No soft tissue changes are seen.   IMPRESSION: Left intratrochanteric fracture.   Degenerative changes of left hip joint.     Electronically Signed   By: Inez Catalina M.D.  Left hip with severe arthritic changes  ROS: Constitutional: Negative.  Negative for chills, fever and weight loss.  HENT: Negative.  Negative for ear pain, hearing loss and tinnitus.   Eyes: Negative.  Negative for blurred vision.  Respiratory: Negative.  Negative for cough, hemoptysis and sputum production.   Cardiovascular: Negative.  Negative for chest pain, palpitations and orthopnea.  Gastrointestinal: Negative.  Negative for abdominal pain, heartburn, nausea and vomiting.  Genitourinary: Negative.  Negative for dysuria, frequency and urgency.  Musculoskeletal:  Positive for joint pain.  Skin: Negative.  Negative for itching and rash.  Neurological: Negative.  Negative for dizziness, tingling and headaches.  Endo/Heme/Allergies: Negative.   Psychiatric/Behavioral: Negative.    All other systems reviewed and are negative.  Blood pressure (!) 162/67, pulse 65, temperature 97.9 F (36.6 C), temperature source Oral, resp. rate 16,  height 5\' 3"  (1.6 m), weight 81.6 kg, SpO2 (!) 88 %.  Physical Exam: Constitutional:      General: She is not in acute distress.    Appearance: Normal appearance. She is obese. She is not ill-appearing, toxic-appearing or diaphoretic.  HENT:     Head: Normocephalic and atraumatic.     Nose:  Nose normal. No rhinorrhea.  Eyes:     General:        Right eye: No discharge.        Left eye: No discharge.  Cardiovascular:     Rate and Rhythm: Normal rate and regular rhythm.     Pulses: Normal pulses.  Pulmonary:     Effort: Pulmonary effort is normal. No respiratory distress.     Breath sounds: Normal breath sounds. No wheezing or rales.  Abdominal:     General: Abdomen is protuberant. Bowel sounds are normal. There is no distension.     Palpations: Abdomen is soft.     Tenderness: There is no abdominal tenderness. There is no guarding or rebound.  Musculoskeletal:        General: Tenderness present.     Right lower leg: No edema.     Left lower leg: No edema.     Comments: Tenderness with palpation left greater trochanter area  Skin:    General: Skin is warm and dry.     Capillary Refill: Capillary refill takes less than 2 seconds.     Comments: Venous stasis changes bilateral lower legs  Neurological:     General: No focal deficit present.     Mental Status: She is alert and oriented to person, place, and time.   Assessment/Plan: Left non-displaced intertrochanteric hip fracture in setting of advanced osteoarthritis  Plan: She will need operative fixation I discussed with her a plan to fix her fracture as well as perform a total hip replacement for her advanced OA that is currently scheduled for May Indications reviewed NPO after MN  Mauri Pole 03/24/2021, 2:43 PM

## 2021-03-24 NOTE — Consult Note (Signed)
Reason for Consult:left intertrochanteric hip fracture+ Referring Physician: Eliseo Squires. MD (Hospitalist)  Amanda [name] is an 81 y.o. female.  HPI: 81 year old female with a history of A-fib, history of complete heart block status post permanent pacemaker, history of Watchman device, history of CKD stage IV baseline creatinine 1.7-2.0, anemia chronic kidney disease, type 2 diabetes, coronary disease, chronic systolic heart failure, OSA on CPAP, left hip osteoarthritis presents the ER today after a fall in the kitchen.  Patient states she was at the stove.  She tried to turn around.  She lost her balance.  She landed on her left hip.  She had immediate pain.  Patient brought to the ER.  Past Medical History:  Diagnosis Date   Acute ischemic stroke ([place])    Acute metabolic encephalopathy    Acute on chronic systolic (congestive) heart failure (HCC)    Acute renal insufficiency 09/02/2016   Anemia due to stage 4 chronic kidney disease ([place]) 04/04/2015   Atrial fibrillation (HCC)    Benign hypertension with CKD (chronic kidney disease) stage IV ([place]) 04/04/2015   Bradycardia 09/17/2014   Chest pain 06/23/2016   Overview:  Added automatically from request for surgery [id number]   Chronic anemia    Chronic kidney disease (CKD), stage III (moderate) ([place])    Coronary artery disease 10/12/2016   Non drug-eluting stent implanted in June 2018 to mid RCA   08/08/2016 10:37  Angiographic Findings  Cardiac Arteries and Lesion Findings LMCA: 0% and Normal. LAD: 0% and Normal. RCA:   Lesion on Mid RCA: Mid subsection.95% stenosis reduced to 0%. Pre procedure   TIMI II flow was noted. Post Procedure TIMI III flow was present. Poor run   off was present. The lesion was diagnosed as High Risk (C).    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 ([place]) 04/04/2015   Diabetes mellitus without complication ([place])    Diverticulosis    Dyslipidemia 09/17/2014   Gastroesophageal reflux    GI bleed 08/06/2016   Heart  block AV complete ([place]) 09/05/2019   History of cardiac monitoring 02/23/2018   monitor inserted   Hyperlipidemia    Hypertensive heart disease with heart failure (HCC)    Iron deficiency anemia 12/28/2017   LV dysfunction 09/02/2016   Myocardial infarction [facility])    NSTEMI (non-ST elevated myocardial infarction) ([place]) 08/06/2016   Obstructive sleep apnea 09/17/2014   Orthostatic hypotension 12/28/2017   OSA on CPAP    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   Postural dizziness with presyncope 09/23/2017   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/24/2017   Recurrent left pleural effusion 03/14/2015   Renal failure, chronic, stage 3 (moderate) ([place])    Syncope 06/04/9765   Systolic heart failure ([place])    Thyroid disease    Vitamin D deficiency 04/04/2015    Past Surgical History:  Procedure Laterality Date   BUBBLE STUDY  05/16/2020   Procedure: BUBBLE STUDY;  Surgeon: Elouise Munroe, MD;  Location: [place];  Service: Cardiovascular;;   CARDIAC CATHETERIZATION     CARDIOVERSION N/A 05/16/2020   Procedure: CARDIOVERSION;  Surgeon: Elouise Munroe, MD;  Location: [place];  Service: Cardiovascular;  Laterality: N/A;   CARDIOVERSION N/A 09/06/2020   Procedure: CARDIOVERSION;  Surgeon: Donato Heinz, MD;  Location: [facility] ENDOSCOPY;  Service: Cardiovascular;  Laterality: N/A;   COLONOSCOPY  11/21/2014   Moderate predominantly sigmoid diverticulosis. Otherwise noraml collonscopy to TI.    CORONARY ANGIOPLASTY WITH STENT PLACEMENT  08/2016  EXCISIONAL HEMORRHOIDECTOMY     LEFT ATRIAL APPENDAGE OCCLUSION  11/2016   in [place] N/A 02/23/2018   Procedure: [place];  Surgeon: Constance Haw, MD;  Location: [place] CV LAB;  Service: Cardiovascular;  Laterality: N/A;   LOOP RECORDER REMOVAL N/A 04/24/2019   Procedure: LOOP RECORDER REMOVAL;  Surgeon: Constance Haw, MD;  Location: [place] CV LAB;  Service:  Cardiovascular;  Laterality: N/A;   NOSE SURGERY     PACEMAKER IMPLANT N/A 04/24/2019   Procedure: PACEMAKER IMPLANT;  Surgeon: Constance Haw, MD;  Location: [place] CV LAB;  Service: Cardiovascular;  Laterality: N/A;   TEE WITHOUT CARDIOVERSION N/A 05/16/2020   Procedure: TRANSESOPHAGEAL ECHOCARDIOGRAM (TEE);  Surgeon: Elouise Munroe, MD;  Location: [facility] ENDOSCOPY;  Service: Cardiovascular;  Laterality: N/A;    Family History  Problem Relation Age of Onset   Breast cancer Mother    Hypertension Father    Prostate cancer Father    Stroke Father    Diabetes Sister    Heart attack Sister    Heart attack Brother    Heart attack Maternal Uncle    Cancer Maternal Grandfather        not sure if it is colon or rectal    Esophageal cancer Neg Hx     Social History:  reports that she has quit smoking. She quit smokeless tobacco use about 26 years ago. She reports that she does not currently use alcohol after a past usage of about 2.0 standard drinks per week. She reports that she does not use drugs.  Allergies:  Allergies  Allergen Reactions   Ciprofloxacin Other (See Comments)    Achilles tendon pain   Contrast Media [Iodinated Contrast Media]     Avoid due to stage 4 kidney disease    Nsaids     Avoid due to stage 4 kidney disease    Statins Other (See Comments)    Joint pain, tolerates pravastatin     Medications: I have reviewed the patient's current medications. Scheduled:  acidophilus  1 capsule Oral Q1200   amiodarone  200 mg Oral Daily   docusate sodium  100 mg Oral BID   ezetimibe  10 mg Oral QPM   famotidine  40 mg Oral Daily   febuxostat  40 mg Oral QHS   furosemide  40 mg Oral BID WC   insulin aspart  0-5 Units Subcutaneous QHS   insulin aspart  0-9 Units Subcutaneous TID WC   isosorbide mononitrate  30 mg Oral QHS   levothyroxine  50 mcg Oral QAC breakfast   omega-3 acid ethyl esters  2 g Oral Q1200   pantoprazole  40 mg Oral Daily   polyethylene  glycol  17 g Oral QODAY   pravastatin  40 mg Oral QHS   ranolazine  1,000 mg Oral BID    Results for orders placed or performed during the hospital encounter of 03/23/21 (from the past 24 hour(s))  Resp Panel by RT-PCR (Flu A&B, Covid) Nasopharyngeal Swab     Status: None   Collection Time: 03/23/21  9:46 PM   Specimen: Nasopharyngeal Swab; Nasopharyngeal(NP) swabs in vial transport medium  Result Value Ref Range   SARS Coronavirus 2 by RT PCR NEGATIVE NEGATIVE   Influenza A by PCR NEGATIVE NEGATIVE   Influenza B by PCR NEGATIVE NEGATIVE  Comprehensive metabolic panel     Status: Abnormal   Collection Time: 03/23/21  9:46 PM  Result Value Ref Range   Sodium 139 135 - 145 mmol/L   Potassium 4.9 3.5 - 5.1 mmol/L   Chloride 103 98 - 111 mmol/L   CO2 28 22 - 32 mmol/L   Glucose, Bld 234 (H) 70 - 99 mg/dL   BUN 38 (H) 8 - 23 mg/dL   Creatinine, Ser 1.63 (H) 0.44 - 1.00 mg/dL   Calcium 9.7 8.9 - 10.3 mg/dL   Total Protein 7.4 6.5 - 8.1 g/dL   Albumin 4.2 3.5 - 5.0 g/dL   AST 25 15 - 41 U/L   ALT 33 0 - 44 U/L   Alkaline Phosphatase 68 38 - 126 U/L   Total Bilirubin 0.6 0.3 - 1.2 mg/dL   GFR, Estimated 32 (L) >60 mL/min   Anion gap 8 5 - 15  CBC with Differential     Status: None   Collection Time: 03/23/21  9:46 PM  Result Value Ref Range   WBC 5.6 4.0 - 10.5 K/uL   RBC 4.15 3.87 - 5.11 MIL/uL   Hemoglobin 13.3 12.0 - 15.0 g/dL   HCT 41.3 36.0 - 46.0 %   MCV 99.5 80.0 - 100.0 fL   MCH 32.0 26.0 - 34.0 pg   MCHC 32.2 30.0 - 36.0 g/dL   RDW 12.4 11.5 - 15.5 %   Platelets 206 150 - 400 K/uL   nRBC 0.0 0.0 - 0.2 %   Neutrophils Relative % 71 %   Neutro Abs 4.0 1.7 - 7.7 K/uL   Lymphocytes Relative 14 %   Lymphs Abs 0.8 0.7 - 4.0 K/uL   Monocytes Relative 9 %   Monocytes Absolute 0.5 0.1 - 1.0 K/uL   Eosinophils Relative 5 %   Eosinophils Absolute 0.3 0.0 - 0.5 K/uL   Basophils Relative 0 %   Basophils Absolute 0.0 0.0 - 0.1 K/uL   Immature Granulocytes 1 %   Abs Immature  Granulocytes 0.03 0.00 - 0.07 K/uL  CBG monitoring, ED     Status: Abnormal   Collection Time: 03/24/21  2:15 PM  Result Value Ref Range   Glucose-Capillary 146 (H) 70 - 99 mg/dL    X-ray: CLINICAL DATA:  Recent fall with hip pain, initial encounter   EXAM: DG HIP (WITH OR WITHOUT PELVIS) 2-3V LEFT   COMPARISON:  None.   FINDINGS: Degenerative changes of the hip joints are noted left considerably worse than right. Undisplaced intratrochanteric left femoral fracture is noted. No soft tissue changes are seen.   IMPRESSION: Left intratrochanteric fracture.   Degenerative changes of left hip joint.     Electronically Signed   By: Inez Catalina M.D.  Left hip with severe arthritic changes  ROS: Constitutional: Negative.  Negative for chills, fever and weight loss.  HENT: Negative.  Negative for ear pain, hearing loss and tinnitus.   Eyes: Negative.  Negative for blurred vision.  Respiratory: Negative.  Negative for cough, hemoptysis and sputum production.   Cardiovascular: Negative.  Negative for chest pain, palpitations and orthopnea.  Gastrointestinal: Negative.  Negative for abdominal pain, heartburn, nausea and vomiting.  Genitourinary: Negative.  Negative for dysuria, frequency and urgency.  Musculoskeletal:  Positive for joint pain.  Skin: Negative.  Negative for itching and rash.  Neurological: Negative.  Negative for dizziness, tingling and headaches.  Endo/Heme/Allergies: Negative.   Psychiatric/Behavioral: Negative.    All other systems reviewed and are negative.  Blood pressure (!) 162/67, pulse 65, temperature 97.9 F (36.6 C), temperature source Oral, resp. rate 16,  height 5\' 3"  (1.6 m), weight 81.6 kg, SpO2 (!) 88 %.  Physical Exam: Constitutional:      General: She is not in acute distress.    Appearance: Normal appearance. She is obese. She is not ill-appearing, toxic-appearing or diaphoretic.  HENT:     Head: Normocephalic and atraumatic.     Nose:  Nose normal. No rhinorrhea.  Eyes:     General:        Right eye: No discharge.        Left eye: No discharge.  Cardiovascular:     Rate and Rhythm: Normal rate and regular rhythm.     Pulses: Normal pulses.  Pulmonary:     Effort: Pulmonary effort is normal. No respiratory distress.     Breath sounds: Normal breath sounds. No wheezing or rales.  Abdominal:     General: Abdomen is protuberant. Bowel sounds are normal. There is no distension.     Palpations: Abdomen is soft.     Tenderness: There is no abdominal tenderness. There is no guarding or rebound.  Musculoskeletal:        General: Tenderness present.     Right lower leg: No edema.     Left lower leg: No edema.     Comments: Tenderness with palpation left greater trochanter area  Skin:    General: Skin is warm and dry.     Capillary Refill: Capillary refill takes less than 2 seconds.     Comments: Venous stasis changes bilateral lower legs  Neurological:     General: No focal deficit present.     Mental Status: She is alert and oriented to person, place, and time.   Assessment/Plan: Left non-displaced intertrochanteric hip fracture in setting of advanced osteoarthritis  Plan: She will need operative fixation I discussed with her a plan to fix her fracture as well as perform a total hip replacement for her advanced OA that is currently scheduled for May Indications reviewed NPO after MN  Mauri Pole 03/24/2021, 2:43 PM

## 2021-03-25 ENCOUNTER — Encounter (HOSPITAL_COMMUNITY): Payer: Self-pay | Admitting: Internal Medicine

## 2021-03-25 ENCOUNTER — Inpatient Hospital Stay (HOSPITAL_COMMUNITY): Payer: Medicare Other

## 2021-03-25 ENCOUNTER — Encounter (HOSPITAL_COMMUNITY)
Admission: EM | Disposition: A | Discharge status: Home Health | Payer: Self-pay | Source: Home / Self Care | Attending: Internal Medicine

## 2021-03-25 ENCOUNTER — Inpatient Hospital Stay (HOSPITAL_COMMUNITY): Payer: Medicare Other | Admitting: Anesthesiology

## 2021-03-25 DIAGNOSIS — Z96642 Presence of left artificial hip joint: Secondary | ICD-10-CM | POA: Diagnosis present

## 2021-03-25 DIAGNOSIS — S72145A Nondisplaced intertrochanteric fracture of left femur, initial encounter for closed fracture: Secondary | ICD-10-CM

## 2021-03-25 DIAGNOSIS — I252 Old myocardial infarction: Secondary | ICD-10-CM

## 2021-03-25 DIAGNOSIS — I2510 Atherosclerotic heart disease of native coronary artery without angina pectoris: Secondary | ICD-10-CM

## 2021-03-25 DIAGNOSIS — M1612 Unilateral primary osteoarthritis, left hip: Secondary | ICD-10-CM

## 2021-03-25 HISTORY — PX: TOTAL HIP REVISION: SHX763

## 2021-03-25 HISTORY — DX: Presence of left artificial hip joint: Z96.642

## 2021-03-25 LAB — CBC
HCT: 34.7 % — ABNORMAL LOW (ref 36.0–46.0)
HCT: 35 % — ABNORMAL LOW (ref 36.0–46.0)
Hemoglobin: 11.3 g/dL — ABNORMAL LOW (ref 12.0–15.0)
Hemoglobin: 11.1 g/dL — ABNORMAL LOW (ref 12.0–15.0)
MCH: 32.8 pg (ref 26.0–34.0)
MCH: 32.7 pg (ref 26.0–34.0)
MCHC: 32.6 g/dL (ref 30.0–36.0)
MCHC: 31.7 g/dL (ref 30.0–36.0)
MCV: 100.9 fL — ABNORMAL HIGH (ref 80.0–100.0)
MCV: 103.2 fL — ABNORMAL HIGH (ref 80.0–100.0)
Platelets: 161 10*3/uL (ref 150–400)
Platelets: 127 10*3/uL — ABNORMAL LOW (ref 150–400)
RBC: 3.44 MIL/uL — ABNORMAL LOW (ref 3.87–5.11)
RBC: 3.39 MIL/uL — ABNORMAL LOW (ref 3.87–5.11)
RDW: 12.6 % (ref 11.5–15.5)
RDW: 12.3 % (ref 11.5–15.5)
WBC: 7.2 10*3/uL (ref 4.0–10.5)
WBC: 6.8 10*3/uL (ref 4.0–10.5)
nRBC: 0 % (ref 0.0–0.2)
nRBC: 0 % (ref 0.0–0.2)

## 2021-03-25 LAB — ABO/RH: ABO/RH(D): O POS

## 2021-03-25 LAB — TYPE AND SCREEN
ABO/RH(D): O POS
Antibody Screen: NEGATIVE

## 2021-03-25 LAB — BASIC METABOLIC PANEL
Anion gap: 6 (ref 5–15)
BUN: 38 mg/dL — ABNORMAL HIGH (ref 8–23)
CO2: 27 mmol/L (ref 22–32)
Calcium: 8.9 mg/dL (ref 8.9–10.3)
Chloride: 102 mmol/L (ref 98–111)
Creatinine, Ser: 1.89 mg/dL — ABNORMAL HIGH (ref 0.44–1.00)
GFR, Estimated: 27 mL/min — ABNORMAL LOW (ref >60)
Glucose, Bld: 185 mg/dL — ABNORMAL HIGH (ref 70–99)
Potassium: 4.3 mmol/L (ref 3.5–5.1)
Sodium: 135 mmol/L (ref 135–145)

## 2021-03-25 LAB — COMPREHENSIVE METABOLIC PANEL
ALT: 62 U/L — ABNORMAL HIGH (ref 0–44)
AST: 65 U/L — ABNORMAL HIGH (ref 15–41)
Albumin: 3.7 g/dL (ref 3.5–5.0)
Alkaline Phosphatase: 70 U/L (ref 38–126)
Anion gap: 10 (ref 5–15)
BUN: 30 mg/dL — ABNORMAL HIGH (ref 8–23)
CO2: 23 mmol/L (ref 22–32)
Calcium: 8.4 mg/dL — ABNORMAL LOW (ref 8.9–10.3)
Chloride: 103 mmol/L (ref 98–111)
Creatinine, Ser: 1.56 mg/dL — ABNORMAL HIGH (ref 0.44–1.00)
GFR, Estimated: 33 mL/min — ABNORMAL LOW (ref >60)
Glucose, Bld: 222 mg/dL — ABNORMAL HIGH (ref 70–99)
Potassium: 4.6 mmol/L (ref 3.5–5.1)
Sodium: 136 mmol/L (ref 135–145)
Total Bilirubin: 0.7 mg/dL (ref 0.3–1.2)
Total Protein: 6.4 g/dL — ABNORMAL LOW (ref 6.5–8.1)

## 2021-03-25 LAB — HEMOGLOBIN A1C
Hgb A1c MFr Bld: 7.3 % — ABNORMAL HIGH (ref 4.8–5.6)
Mean Plasma Glucose: 163 mg/dL

## 2021-03-25 LAB — GLUCOSE, CAPILLARY
Glucose-Capillary: 159 mg/dL — ABNORMAL HIGH (ref 70–99)
Glucose-Capillary: 227 mg/dL — ABNORMAL HIGH (ref 70–99)

## 2021-03-25 LAB — SURGICAL PCR SCREEN
MRSA, PCR: NEGATIVE
Staphylococcus aureus: NEGATIVE

## 2021-03-25 SURGERY — TOTAL HIP REVISION
Anesthesia: Spinal | Site: Hip | Laterality: Left

## 2021-03-25 MED ORDER — PHENYLEPHRINE HCL (PRESSORS) 10 MG/ML IV SOLN
INTRAVENOUS | Status: AC
  Filled 2021-03-25: qty 1

## 2021-03-25 MED ORDER — SODIUM CHLORIDE 0.9 % IV SOLN: INTRAVENOUS | Status: DC

## 2021-03-25 MED ORDER — ENSURE SURGERY PO LIQD
237.0000 mL | Freq: Two times a day (BID) | ORAL | Status: DC
  Administered 2021-03-26 – 2021-03-31 (×6): 237 mL via ORAL
  Filled 2021-03-25 (×13): qty 237

## 2021-03-25 MED ORDER — CEFAZOLIN SODIUM-DEXTROSE 2-4 GM/100ML-% IV SOLN
2.0000 g | INTRAVENOUS | Status: AC
  Filled 2021-03-25: qty 100

## 2021-03-25 MED ORDER — METHOCARBAMOL 500 MG IVPB - SIMPLE MED
INTRAVENOUS | Status: AC
  Filled 2021-03-25: qty 50

## 2021-03-25 MED ORDER — DEXAMETHASONE SODIUM PHOSPHATE 10 MG/ML IJ SOLN
10.0000 mg | Freq: Once | INTRAMUSCULAR | Status: AC
  Administered 2021-03-26: 10 mg via INTRAVENOUS
  Filled 2021-03-25: qty 1

## 2021-03-25 MED ORDER — PHENOL 1.4 % MT LIQD: 1.0000 | OROMUCOSAL | Status: DC | PRN

## 2021-03-25 MED ORDER — VASOPRESSIN 20 UNIT/ML IV SOLN
INTRAVENOUS | Status: AC
  Filled 2021-03-25: qty 1

## 2021-03-25 MED ORDER — VASOPRESSIN 20 UNIT/ML IV SOLN
INTRAVENOUS | Status: DC | PRN
  Administered 2021-03-25 (×3): 1 [IU] via INTRAVENOUS

## 2021-03-25 MED ORDER — ONDANSETRON HCL 4 MG/2ML IJ SOLN
INTRAMUSCULAR | Status: DC | PRN
  Administered 2021-03-25: 4 mg via INTRAVENOUS

## 2021-03-25 MED ORDER — HYDROCODONE-ACETAMINOPHEN 5-325 MG PO TABS
1.0000 | ORAL_TABLET | ORAL | Status: DC | PRN
  Administered 2021-03-26 – 2021-03-28 (×4): 1 via ORAL
  Filled 2021-03-25 (×4): qty 1

## 2021-03-25 MED ORDER — DEXAMETHASONE SODIUM PHOSPHATE 10 MG/ML IJ SOLN
8.0000 mg | Freq: Once | INTRAMUSCULAR | Status: AC
  Administered 2021-03-25: 8 mg via INTRAVENOUS
  Filled 2021-03-25: qty 1

## 2021-03-25 MED ORDER — FENTANYL CITRATE (PF) 100 MCG/2ML IJ SOLN
INTRAMUSCULAR | Status: DC | PRN
  Administered 2021-03-25 (×2): 50 ug via INTRAVENOUS

## 2021-03-25 MED ORDER — ALBUMIN HUMAN 5 % IV SOLN: INTRAVENOUS | Status: DC | PRN

## 2021-03-25 MED ORDER — PHENYLEPHRINE HCL-NACL 20-0.9 MG/250ML-% IV SOLN
INTRAVENOUS | Status: DC | PRN
  Administered 2021-03-25: 50 ug/min via INTRAVENOUS

## 2021-03-25 MED ORDER — FENTANYL CITRATE (PF) 100 MCG/2ML IJ SOLN
INTRAMUSCULAR | Status: AC
  Filled 2021-03-25: qty 2

## 2021-03-25 MED ORDER — LACTATED RINGERS IV SOLN: INTRAVENOUS | Status: DC

## 2021-03-25 MED ORDER — HYDROCODONE-ACETAMINOPHEN 7.5-325 MG PO TABS
1.0000 | ORAL_TABLET | ORAL | Status: DC | PRN
  Administered 2021-03-26: 2 via ORAL
  Administered 2021-03-27: 1 via ORAL
  Administered 2021-03-27: 2 via ORAL
  Administered 2021-03-28 – 2021-03-31 (×12): 1 via ORAL
  Administered 2021-03-31: 2 via ORAL
  Filled 2021-03-25: qty 1
  Filled 2021-03-25: qty 2
  Filled 2021-03-25 (×7): qty 1
  Filled 2021-03-25 (×2): qty 2
  Filled 2021-03-25 (×5): qty 1

## 2021-03-25 MED ORDER — FENTANYL CITRATE PF 50 MCG/ML IJ SOSY
PREFILLED_SYRINGE | INTRAMUSCULAR | Status: AC
  Filled 2021-03-25: qty 1

## 2021-03-25 MED ORDER — PHENYLEPHRINE HCL (PRESSORS) 10 MG/ML IV SOLN
INTRAVENOUS | Status: DC | PRN
Start: 2021-03-25 — End: 2021-03-25
  Administered 2021-03-25: 160 ug via INTRAVENOUS
  Administered 2021-03-25: 120 ug via INTRAVENOUS
  Administered 2021-03-25: 80 ug via INTRAVENOUS
  Administered 2021-03-25: 160 ug via INTRAVENOUS

## 2021-03-25 MED ORDER — CHLORHEXIDINE GLUCONATE CLOTH 2 % EX PADS: 6.0000 | MEDICATED_PAD | Freq: Every day | CUTANEOUS | Status: DC

## 2021-03-25 MED ORDER — ONDANSETRON HCL 4 MG PO TABS: 4.0000 mg | ORAL_TABLET | Freq: Four times a day (QID) | ORAL | Status: DC | PRN

## 2021-03-25 MED ORDER — DOCUSATE SODIUM 100 MG PO CAPS
100.0000 mg | ORAL_CAPSULE | Freq: Two times a day (BID) | ORAL | Status: DC
  Administered 2021-03-25 – 2021-03-31 (×12): 100 mg via ORAL
  Filled 2021-03-25 (×12): qty 1

## 2021-03-25 MED ORDER — BUPIVACAINE HCL (PF) 0.5 % IJ SOLN
INTRAMUSCULAR | Status: DC | PRN
  Administered 2021-03-25: 12 mg via INTRATHECAL

## 2021-03-25 MED ORDER — PROPOFOL 500 MG/50ML IV EMUL
INTRAVENOUS | Status: DC | PRN
  Administered 2021-03-25: 30 mg via INTRAVENOUS

## 2021-03-25 MED ORDER — TRANEXAMIC ACID-NACL 1000-0.7 MG/100ML-% IV SOLN
1000.0000 mg | INTRAVENOUS | Status: AC
  Filled 2021-03-25: qty 100

## 2021-03-25 MED ORDER — ACETAMINOPHEN 10 MG/ML IV SOLN
1000.0000 mg | Freq: Four times a day (QID) | INTRAVENOUS | Status: DC
  Administered 2021-03-25: 1000 mg via INTRAVENOUS
  Filled 2021-03-25 (×2): qty 100

## 2021-03-25 MED ORDER — CEFAZOLIN SODIUM-DEXTROSE 2-4 GM/100ML-% IV SOLN
2.0000 g | Freq: Four times a day (QID) | INTRAVENOUS | Status: AC
  Administered 2021-03-25 – 2021-03-26 (×2): 2 g via INTRAVENOUS
  Filled 2021-03-25 (×2): qty 100

## 2021-03-25 MED ORDER — PROPOFOL 500 MG/50ML IV EMUL
INTRAVENOUS | Status: DC | PRN
  Administered 2021-03-25: 75 ug/kg/min via INTRAVENOUS

## 2021-03-25 MED ORDER — FENTANYL CITRATE PF 50 MCG/ML IJ SOSY
25.0000 ug | PREFILLED_SYRINGE | INTRAMUSCULAR | Status: DC | PRN
  Administered 2021-03-25 (×2): 25 ug via INTRAVENOUS

## 2021-03-25 MED ORDER — TRANEXAMIC ACID-NACL 1000-0.7 MG/100ML-% IV SOLN
1000.0000 mg | Freq: Once | INTRAVENOUS | Status: AC
  Administered 2021-03-25: 1000 mg via INTRAVENOUS
  Filled 2021-03-25: qty 100

## 2021-03-25 MED ORDER — ASPIRIN 81 MG PO CHEW
81.0000 mg | CHEWABLE_TABLET | Freq: Two times a day (BID) | ORAL | Status: DC
  Administered 2021-03-25 – 2021-03-31 (×12): 81 mg via ORAL
  Filled 2021-03-25 (×12): qty 1

## 2021-03-25 MED ORDER — FAMOTIDINE 20 MG PO TABS
10.0000 mg | ORAL_TABLET | Freq: Every day | ORAL | Status: DC
  Administered 2021-03-26 – 2021-03-31 (×6): 10 mg via ORAL
  Filled 2021-03-25 (×6): qty 1

## 2021-03-25 MED ORDER — SODIUM CHLORIDE 0.9 % IR SOLN
Status: DC | PRN
  Administered 2021-03-25: 1000 mL

## 2021-03-25 MED ORDER — ONDANSETRON HCL 4 MG/2ML IJ SOLN: 4.0000 mg | Freq: Four times a day (QID) | INTRAMUSCULAR | Status: DC | PRN

## 2021-03-25 MED ORDER — FERROUS SULFATE 325 (65 FE) MG PO TABS
325.0000 mg | ORAL_TABLET | Freq: Three times a day (TID) | ORAL | Status: DC
  Administered 2021-03-26 – 2021-03-29 (×10): 325 mg via ORAL
  Filled 2021-03-25 (×10): qty 1

## 2021-03-25 MED ORDER — METHOCARBAMOL 500 MG PO TABS
500.0000 mg | ORAL_TABLET | Freq: Four times a day (QID) | ORAL | Status: DC | PRN
Start: 2021-03-25 — End: 2021-03-31
  Administered 2021-03-28: 500 mg via ORAL
  Filled 2021-03-25: qty 1

## 2021-03-25 MED ORDER — RANOLAZINE ER 500 MG PO TB12
500.0000 mg | ORAL_TABLET | Freq: Two times a day (BID) | ORAL | Status: DC
  Administered 2021-03-25 – 2021-03-31 (×12): 500 mg via ORAL
  Filled 2021-03-25 (×12): qty 1

## 2021-03-25 MED ORDER — EPHEDRINE SULFATE (PRESSORS) 50 MG/ML IJ SOLN
INTRAMUSCULAR | Status: DC | PRN
Start: 2021-03-25 — End: 2021-03-25
  Administered 2021-03-25 (×3): 10 mg via INTRAVENOUS
  Administered 2021-03-25: 15 mg via INTRAVENOUS

## 2021-03-25 MED ORDER — BISACODYL 10 MG RE SUPP
10.0000 mg | Freq: Every day | RECTAL | Status: DC | PRN
  Administered 2021-03-28: 10 mg via RECTAL
  Filled 2021-03-25: qty 1

## 2021-03-25 MED ORDER — METHOCARBAMOL 500 MG IVPB - SIMPLE MED
500.0000 mg | Freq: Four times a day (QID) | INTRAVENOUS | Status: DC | PRN
  Administered 2021-03-25: 500 mg via INTRAVENOUS

## 2021-03-25 MED ORDER — METOCLOPRAMIDE HCL 5 MG/ML IJ SOLN: 5.0000 mg | Freq: Three times a day (TID) | INTRAMUSCULAR | Status: DC | PRN

## 2021-03-25 MED ORDER — MENTHOL 3 MG MT LOZG: 1.0000 | LOZENGE | OROMUCOSAL | Status: DC | PRN

## 2021-03-25 MED ORDER — ACETAMINOPHEN 325 MG PO TABS: 325.0000 mg | ORAL_TABLET | Freq: Four times a day (QID) | ORAL | Status: DC | PRN

## 2021-03-25 MED ORDER — DIPHENHYDRAMINE HCL 12.5 MG/5ML PO ELIX: 12.5000 mg | ORAL_SOLUTION | ORAL | Status: DC | PRN

## 2021-03-25 MED ORDER — POVIDONE-IODINE 10 % EX SWAB: 2.0000 "application " | Freq: Once | CUTANEOUS | Status: DC

## 2021-03-25 MED ORDER — METOCLOPRAMIDE HCL 5 MG PO TABS: 5.0000 mg | ORAL_TABLET | Freq: Three times a day (TID) | ORAL | Status: DC | PRN

## 2021-03-25 MED ORDER — POLYETHYLENE GLYCOL 3350 17 G PO PACK
17.0000 g | PACK | Freq: Every day | ORAL | Status: DC | PRN
  Administered 2021-03-29: 17 g via ORAL
  Filled 2021-03-25: qty 1

## 2021-03-25 MED ORDER — MORPHINE SULFATE (PF) 2 MG/ML IV SOLN: 0.5000 mg | INTRAVENOUS | Status: DC | PRN

## 2021-03-25 SURGICAL SUPPLY — 59 items
AML 16.5 STD 6.3 5/8 STD 12/1 (Hips) ×2 IMPLANT
BAG COUNTER SPONGE SURGICOUNT (BAG) IMPLANT
BAG DECANTER FOR FLEXI CONT (MISCELLANEOUS) ×2 IMPLANT
BAG ZIPLOCK 12X15 (MISCELLANEOUS) ×2 IMPLANT
BLADE SAW SGTL 18X1.27X75 (BLADE) IMPLANT
BRUSH FEMORAL CANAL (MISCELLANEOUS) IMPLANT
CABLE CERLAGE W/CRIMP 1.8 (Cable) ×1 IMPLANT
CABLE-READY CABLE GRIP SYSTEM VCERCLAGE CABLE WITH (Orthopedic Implant) ×1 IMPLANT
COVER SURGICAL LIGHT HANDLE (MISCELLANEOUS) ×2 IMPLANT
CUP ACETBLR 52 OD PINNACLE (Hips) ×1 IMPLANT
DERMABOND ADVANCED (GAUZE/BANDAGES/DRESSINGS) ×1
DERMABOND ADVANCED .7 DNX12 (GAUZE/BANDAGES/DRESSINGS) ×1 IMPLANT
DRAPE INCISE IOBAN 85X60 (DRAPES) ×2 IMPLANT
DRAPE ORTHO SPLIT 77X108 STRL (DRAPES) ×2
DRAPE POUCH INSTRU U-SHP 10X18 (DRAPES) ×2 IMPLANT
DRAPE SURG 17X11 SM STRL (DRAPES) ×2 IMPLANT
DRAPE SURG ORHT 6 SPLT 77X108 (DRAPES) ×2 IMPLANT
DRAPE U-SHAPE 47X51 STRL (DRAPES) ×2 IMPLANT
DRSG AQUACEL AG ADV 3.5X10 (GAUZE/BANDAGES/DRESSINGS) IMPLANT
DRSG AQUACEL AG ADV 3.5X14 (GAUZE/BANDAGES/DRESSINGS) ×1 IMPLANT
DURAPREP 26ML APPLICATOR (WOUND CARE) ×2 IMPLANT
ELECT BLADE TIP CTD 4 INCH (ELECTRODE) ×2 IMPLANT
ELECT REM PT RETURN 15FT ADLT (MISCELLANEOUS) ×2 IMPLANT
ELIMINATOR HOLE APEX DEPUY (Hips) ×1 IMPLANT
FACESHIELD WRAPAROUND (MASK) ×8 IMPLANT
FACESHIELD WRAPAROUND OR TEAM (MASK) ×4 IMPLANT
GLOVE SURG ENC MOIS LTX SZ6 (GLOVE) ×4 IMPLANT
GLOVE SURG UNDER LTX SZ6.5 (GLOVE) ×2 IMPLANT
GLOVE SURG UNDER POLY LF SZ7.5 (GLOVE) ×2 IMPLANT
GOWN STRL REUS W/TWL LRG LVL3 (GOWN DISPOSABLE) ×4 IMPLANT
HANDPIECE INTERPULSE COAX TIP (DISPOSABLE) ×1
HEAD M SROM 36MM 2 (Hips) IMPLANT
HIP AML 16.5 STD 6.3 5/8STD2/1 (Hips) IMPLANT
KIT BASIN OR (CUSTOM PROCEDURE TRAY) ×2 IMPLANT
KIT TURNOVER KIT A (KITS) IMPLANT
LINER NEUTRAL 52X36MM PLUS 4 (Liner) ×1 IMPLANT
MANIFOLD NEPTUNE II (INSTRUMENTS) ×2 IMPLANT
NDL SAFETY ECLIPSE 18X1.5 (NEEDLE) ×1 IMPLANT
NEEDLE HYPO 18GX1.5 SHARP (NEEDLE) ×1
NS IRRIG 1000ML POUR BTL (IV SOLUTION) ×2 IMPLANT
PACK TOTAL JOINT (CUSTOM PROCEDURE TRAY) ×2 IMPLANT
PROTECTOR NERVE ULNAR (MISCELLANEOUS) ×2 IMPLANT
SCREW 6.5MMX30MM (Screw) ×1 IMPLANT
SET HNDPC FAN SPRY TIP SCT (DISPOSABLE) ×1 IMPLANT
SOLUTION IRRIG SURGIPHOR (IV SOLUTION) IMPLANT
SPONGE T-LAP 18X18 ~~LOC~~+RFID (SPONGE) ×6 IMPLANT
SROM M HEAD 36MM 2 (Hips) ×2 IMPLANT
STAPLER VISISTAT 35W (STAPLE) IMPLANT
SUCTION FRAZIER HANDLE 12FR (TUBING) ×1
SUCTION TUBE FRAZIER 12FR DISP (TUBING) ×1 IMPLANT
SUT MNCRL AB 4-0 PS2 18 (SUTURE) ×1 IMPLANT
SUT STRATAFIX PDS+ 0 24IN (SUTURE) ×2 IMPLANT
SUT VIC AB 1 CT1 36 (SUTURE) ×3 IMPLANT
SUT VIC AB 2-0 CT1 27 (SUTURE) ×2
SUT VIC AB 2-0 CT1 TAPERPNT 27 (SUTURE) ×2 IMPLANT
TOWEL OR 17X26 10 PK STRL BLUE (TOWEL DISPOSABLE) ×4 IMPLANT
TRAY FOLEY MTR SLVR 16FR STAT (SET/KITS/TRAYS/PACK) ×2 IMPLANT
TUBE SUCTION HIGH CAP CLEAR NV (SUCTIONS) ×2 IMPLANT
WATER STERILE IRR 1000ML POUR (IV SOLUTION) ×4 IMPLANT

## 2021-03-25 NOTE — Anesthesia Preprocedure Evaluation (Addendum)
Anesthesia Evaluation  Patient identified by MRN, date of birth, ID band Patient awake    Reviewed: Allergy & Precautions, H&P , NPO status , Patient's Chart, lab work & pertinent test results  Airway Mallampati: III  TM Distance: >3 FB Neck ROM: Full    Dental no notable dental hx. (+) Teeth Intact, Dental Advisory Given   Pulmonary sleep apnea , former smoker,    Pulmonary exam normal breath sounds clear to auscultation       Cardiovascular hypertension, Pt. on medications + CAD, + Past MI and +CHF  + dysrhythmias Atrial Fibrillation + pacemaker  Rhythm:Regular Rate:Normal     Neuro/Psych CVA negative psych ROS   GI/Hepatic Neg liver ROS, GERD  Medicated,  Endo/Other  diabetes, Insulin Dependent  Renal/GU Renal InsufficiencyRenal disease  negative genitourinary   Musculoskeletal   Abdominal   Peds  Hematology  (+) Blood dyscrasia, anemia ,   Anesthesia Other Findings   Reproductive/Obstetrics negative OB ROS                            Anesthesia Physical Anesthesia Plan  ASA: 3  Anesthesia Plan: Spinal   Post-op Pain Management: Tylenol PO (pre-op)*   Induction: Intravenous  PONV Risk Score and Plan: 3 and Propofol infusion, Ondansetron and Dexamethasone  Airway Management Planned: Natural Airway and Simple Face Mask  Additional Equipment:   Intra-op Plan:   Post-operative Plan:   Informed Consent: I have reviewed the patients History and Physical, chart, labs and discussed the procedure including the risks, benefits and alternatives for the proposed anesthesia with the patient or authorized representative who has indicated his/her understanding and acceptance.     Dental advisory given  Plan Discussed with: CRNA  Anesthesia Plan Comments:         Anesthesia Quick Evaluation

## 2021-03-25 NOTE — Brief Op Note (Signed)
03/25/2021  4:13 PM  PATIENT:  Amanda [name]  81 y.o. female  PRE-OPERATIVE DIAGNOSIS:  Non displaced left intertrochanteric hip fracture in setting of advanced left hip osteoarthritis  POST-OPERATIVE DIAGNOSIS:  Non displaced left intertrochanteric hip fracture in setting of advanced left hip osteoarthritis  PROCEDURE:  Procedure(s): LEFT POSTERIOR TOTAL HIP  ZIMMER CABLES (Left)  SURGEON:  Surgeon(s) and Role:    Paralee Cancel, MD - Primary  PHYSICIAN ASSISTANT: Costella Hatcher, PA-C  ANESTHESIA:   spinal  EBL:  150 mL   BLOOD ADMINISTERED:none  DRAINS: none   LOCAL MEDICATIONS USED:  NONE  SPECIMEN:  No Specimen  DISPOSITION OF SPECIMEN:  N/A  COUNTS:  YES  TOURNIQUET:  * No tourniquets in log *  DICTATION: .Other Dictation: Dictation Number [id number]  PLAN OF CARE: Admit to inpatient   PATIENT DISPOSITION:  PACU - hemodynamically stable.   Delay start of Pharmacological VTE agent (>24hrs) due to surgical blood loss or risk of bleeding: no

## 2021-03-25 NOTE — Discharge Instructions (Signed)

## 2021-03-25 NOTE — Anesthesia Postprocedure Evaluation (Signed)
Anesthesia Post Note  Patient: Amanda [name]  Procedure(s) Performed: LEFT POSTERIOR TOTAL HIP  ZIMMER CABLES (Left: Hip)     Patient location during evaluation: Other (Floor room) Anesthesia Type: Spinal Level of consciousness: oriented and awake and alert Pain management: pain level controlled Vital Signs Assessment: post-procedure vital signs reviewed and stable Respiratory status: spontaneous breathing, respiratory function stable and patient connected to nasal cannula oxygen Cardiovascular status: blood pressure returned to baseline and stable Postop Assessment: no headache, no backache, no apparent nausea or vomiting, spinal receding and patient able to bend at knees Anesthetic complications: no   No notable events documented.  Last Vitals:  Vitals:   03/25/21 1730 03/25/21 1753  BP:  (!) 166/59  Pulse: 67 61  Resp: 15 20  Temp:  36.5 C  SpO2: 99% 99%    Last Pain:  Vitals:   03/25/21 1753  TempSrc: Oral  PainSc:                  Ryleah Miramontes,W. EDMOND

## 2021-03-25 NOTE — Plan of Care (Signed)
Initiate Hip Replacement Care plan

## 2021-03-25 NOTE — Plan of Care (Signed)
Problem: Education: Goal: Knowledge of General Education information will improve Description: Including pain rating scale, medication(s)/side effects and non-pharmacologic comfort measures 03/25/2021 1300 by Ivan Anchors, RN Outcome: Progressing 03/25/2021 1018 by Ivan Anchors, RN Outcome: Progressing   Problem: Clinical Measurements: Goal: Will remain free from infection 03/25/2021 1300 by Ivan Anchors, RN Outcome: Progressing 03/25/2021 1018 by Ivan Anchors, RN Outcome: Progressing   Problem: Pain Managment: Goal: General experience of comfort will improve 03/25/2021 1300 by Ivan Anchors, RN Outcome: Progressing 03/25/2021 1018 by Ivan Anchors, RN Outcome: [place] Amanda Lingle, RN 03/25/21 1:01 PM

## 2021-03-25 NOTE — Anesthesia Procedure Notes (Signed)
Spinal  Patient location during procedure: OR Start time: 03/25/2021 2:10 PM End time: 03/25/2021 2:15 PM Reason for block: surgical anesthesia Staffing Performed: anesthesiologist  Anesthesiologist: Roderic Palau, MD Preanesthetic Checklist Completed: patient identified, IV checked, risks and benefits discussed, surgical consent, monitors and equipment checked, pre-op evaluation and timeout performed Spinal Block Patient position: right lateral decubitus Prep: DuraPrep Patient monitoring: cardiac monitor, continuous pulse ox and blood pressure Approach: midline Location: L3-4 Injection technique: single-shot Needle Needle type: Quincke  Needle gauge: 22 G Needle length: 9 cm Assessment Sensory level: T8 Events: CSF return Additional Notes Functioning IV was confirmed and monitors were applied. Sterile prep and drape, including hand hygiene and sterile gloves were used. The patient was positioned and the spine was prepped. The skin was anesthetized with lidocaine.  Free flow of clear CSF was obtained prior to injecting local anesthetic into the CSF.  The spinal needle aspirated freely following injection.  The needle was carefully withdrawn.  The patient tolerated the procedure well.

## 2021-03-25 NOTE — Plan of Care (Signed)
Problem: Education: Goal: Knowledge of General Education information will improve Description: Including pain rating scale, medication(s)/side effects and non-pharmacologic comfort measures Outcome: Progressing   Problem: Clinical Measurements: Goal: Will remain free from infection Outcome: Progressing   Problem: Clinical Measurements: Goal: Respiratory complications will improve Outcome: [place], RN 03/25/21 10:19 AM

## 2021-03-25 NOTE — Interval H&P Note (Signed)
History and Physical Interval Note:  03/25/2021 12:46 PM  Amanda [name]  has presented today for surgery, with the diagnosis of Left hip fracture.  The various methods of treatment have been discussed with the patient and family. After consideration of risks, benefits and other options for treatment, the patient has consented to  Procedure(s): LEFT POSTERIOR TOTAL HIP  ZIMMER CABLES (Left) as a surgical intervention.  The patient's history has been reviewed, patient examined, no change in status, stable for surgery.  I have reviewed the patient's chart and labs.  Questions were answered to the patient's satisfaction.     Mauri Pole

## 2021-03-25 NOTE — Transfer of Care (Signed)
Immediate Anesthesia Transfer of Care Note  Patient: Amanda [name]  Procedure(s) Performed: LEFT POSTERIOR TOTAL HIP  ZIMMER CABLES (Left: Hip)  Patient Location: PACU  Anesthesia Type:Spinal  Level of Consciousness: sedated, patient cooperative and responds to stimulation  Airway & Oxygen Therapy: Patient Spontanous Breathing and Patient connected to face mask oxygen  Post-op Assessment: Report given to RN and Post -op Vital signs reviewed and stable  Post vital signs: Reviewed and stable  Last Vitals:  Vitals Value Taken Time  BP 126/44 03/25/21 1606  Temp    Pulse 49 03/25/21 1610  Resp 12 03/25/21 1610  SpO2 100 % 03/25/21 1610  Vitals shown include unvalidated device data.  Last Pain:  Vitals:   03/25/21 1215  TempSrc: Oral  PainSc:       Patients Stated Pain Goal: 2 (76/28/31 5176)  Complications: No notable events documented.

## 2021-03-25 NOTE — Progress Notes (Signed)
PROGRESS NOTE  Amanda [name]  DOB: [birth year]  PCP: Emmaline Kluver, MD YKD:[id number]  DOA: 03/23/2021  LOS: 2 days  Hospital Day: 3  Brief narrative: Amanda [name] is a 81 y.o. female with PMH significant for DM2, HTN, HLD, CAD/stent, chronic diastolic CHF, complete heart block s/p ppm, A-fib s/p Watchman device, stroke, CKD, h/o GI bleed, chronic anemia, GERD, diverticulosis, obstructive sleep apnea, hypothyroidism, vitamin D deficiency, osteoarthritis of hip. Patient was brought to the ED on 03/23/2021 after a fall at home from losing her balance.  She landed on her left hip and started having severe pain.  In the ED, x-ray of left hip showed intertrochanteric fracture. Lab with BUN/creatinine 38/2.6, glucose 234, hemoglobin 13.3 EDP admitted to hospitalist service Orthopedics consulted Tentative plan for surgical fixation today on 2/21.  Subjective: Patient was seen and examined this morning.  Pleasant elderly Caucasian female.  Propped up in bed.  Not in distress.  No chest pain, shortness of breath, palpitation. Family at bedside.  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 -Orthopedics plans for ORIF today 2/21.  N.p.o. this morning.  Chronic diastolic CHF Essential hypertension -Follows up with Dr. Agustin Cree at [place].  Last  echo from 12/2020 with EF 60 to 65%, severe concentric LVH, mildly elevated pulmonary artery pressure -Home meds include Lasix 40 mg twice daily, Imdur 30 mg daily. -Currently euvolemic. -Continue Imdur.  In preparation of surgery, I held Lasix this morning.  Because of creatinine trending up, start on gentle IV hydration with NS at 75 mill per hour perioperatively.  Permanent A-fib Complete heart block s/p PPM and Watchman device -Continue amiodarone 200 mg daily -Not on antiplatelet/anticoagulant because of history of GI bleeding  History of CAD/stents -Currently no anginal symptoms.   -Home meds include Ranexa, Imdur. -Not on antiplatelet/anticoagulant because of history of GI bleeding  Dyslipidemia -Continue Repatha, Zetia, Lovaza, Pravachol.  Type 2 diabetes mellitus -A1c pending -I do not see any antidiabetic medications in her home list -Currently on sliding scale insulin with Accu-Cheks -Blood sugar level consistently less than 200 No results found for: HGBA1C Recent Labs  Lab 03/24/21 1415 03/24/21 1659 03/24/21 2051 03/25/21 0731  GLUCAP 146* 194* 189* 159*   CKD 4 -Baseline creatinine less than 1.8.  Currently in range but creatinine gradually trending up.  Perioperatively, hold Lasix, start on gentle IV hydration.  Continue to monitor creatinine -Follows with nephrologist at [place]    05/16/20 0902 05/16/20 0924 06/10/20 1508 06/17/20 1047 08/28/20 0000 09/06/20 0938 03/23/21 2146 03/25/21 0110  BUN 31* 44* 49* 50* 57* 43* 38* 38*  CREATININE 1.00 1.60* 1.64* 1.78* 2.28* 1.70* 1.63* 1.89*   Chronic anemia -Hemoglobin stable at baseline over 10. -Continue supplementation with iron, folic acid Recent Labs    05/16/20 0924 08/28/20 0000 09/06/20 3976 03/23/21 2146  03/25/21 0110  HGB 10.5* 11.4 10.9* 13.3 11.3*  MCV  --  97  --  99.5 100.9*   GERD -Continue Protonix.  Also on Pepcid  History of gout -Continue  Uloric  Hypothyroidism -Continue Synthroid  OSA on CPAP Continue CPAP  Osteoarthritis -Patient was planned for left hip replacement in May 2023.  Coincidentally, now she has fracture on the same hip.  Mobility: PT eval postoperatively Goals of care   Code Status: Full Code    Nutritional status:  Body mass index is 31.89 kg/m.      Diet:  Diet Order             Diet NPO time specified  Diet effective now                   DVT prophylaxis:  SCDs Start: 03/24/21 0026   Antimicrobials: None Fluid: NS at 4 mill per hour Consultants: Orthopedics Family Communication: Family at bedside  Status is: Inpatient  Continue in-hospital care because: Pending ORIF Level of care: Telemetry .  Because of her significant cardiac history, I would move to telemetry unit postprocedure.  Dispo: The patient is from: Home              Anticipated d/c is to: Pending clinical course              Patient currently is not medically stable to d/c.   Difficult to place patient No     Infusions:   sodium chloride 75 mL/hr at 03/25/21 0110    ceFAZolin (ANCEF) IV     lactated ringers 10 mL/hr at 03/24/21 0104   tranexamic acid      Scheduled Meds:  acidophilus  1 capsule Oral Q1200   amiodarone  200 mg Oral Daily   dexamethasone (DECADRON) injection  8 mg Intravenous Once   docusate sodium  100 mg Oral BID   ezetimibe  10 mg Oral QPM   [START ON 03/26/2021] famotidine  10 mg Oral Daily   febuxostat  40 mg Oral QHS   insulin aspart  0-5 Units Subcutaneous QHS   insulin aspart  0-9 Units Subcutaneous TID WC   isosorbide mononitrate  30 mg Oral QHS   levothyroxine  50 mcg Oral QAC breakfast   omega-3 acid ethyl esters  2 g Oral Q1200   pantoprazole  40 mg Oral Daily   polyethylene glycol  17 g Oral QODAY   povidone-iodine  2 application Topical Once   povidone-iodine  2 application Topical Once   pravastatin  40 mg Oral QHS   ranolazine  500 mg Oral BID    PRN  meds: bisacodyl, HYDROmorphone (DILAUDID) injection, methocarbamol, oxyCODONE   Antimicrobials: Anti-infectives (From admission, onward)    Start     Dose/Rate Route Frequency Ordered Stop   03/25/21 1300  ceFAZolin (ANCEF) IVPB 2g/100 mL premix        2 g 200 mL/hr over 30 Minutes Intravenous On call to O.R. 03/24/21 1835 03/26/21 0559       Objective: Vitals:   03/25/21 0505 03/25/21 0944  BP: (!) 164/74 (!) 157/50  Pulse: 77 74  Resp: 16 20  Temp: 97.9 F (36.6 C) 98.9 F (37.2 C)  SpO2: 98% 91%    Intake/Output Summary (Last 24 hours) at 03/25/2021 1050 Last data filed at 03/25/2021 0600 Gross per 24 hour  Intake 358.9 ml  Output 200 ml  Net 158.9 ml   Filed Weights   03/23/21 2043  Weight: 81.6 kg   Weight change:  Body mass index is 31.89 kg/m.   Physical Exam: General exam: Pleasant, elderly Caucasian female.  Propped up in bed.  Not in physical distress Skin: No rashes, lesions or ulcers. HEENT: Atraumatic, normocephalic, no obvious bleeding Lungs: Clear to auscultation bilaterally CVS: Regular rate and rhythm, no murmur GI/Abd soft, nontender, nondistended, bowel sound present CNS: Alert, awake, oriented x3 Psychiatry: Mood appropriate Extremities: Chronic stasis changes in both legs.  No pedal edema however.  No calf tenderness  Data Review: I have personally reviewed the laboratory data and studies available.  F/u labs ordered Unresulted Labs (From admission, onward)     Start     Ordered   03/25/21 1041  Hemoglobin A1c  Add-on,   AD       Question:  Specimen collection method  Answer:  IV Team=IV Team collect   03/25/21 1040   03/25/21 0110  ABO/Rh  Once,   R        03/25/21 0110   Unscheduled  CBC with Differential/Platelet  Daily,   R     Question:  Specimen collection method  Answer:  IV Team=IV Team collect   03/25/21 1049   Unscheduled  Basic metabolic panel  Daily,   R     Question:  Specimen collection method  Answer:  IV Team=IV  Team collect   03/25/21 1049            Signed, Terrilee Croak, MD [facility] 03/25/2021

## 2021-03-25 NOTE — Progress Notes (Signed)
Initial Nutrition Assessment  INTERVENTION:   -Ensure Surgery PO BID, each provides 330 kcals and 18g protein   NUTRITION DIAGNOSIS:   Increased nutrient needs related to post-op healing, hip fracture as evidenced by estimated needs.   GOAL:   Patient will meet greater than or equal to 90% of their needs  MONITOR:   PO intake, Supplement acceptance, Weight trends, I & O's, Labs  REASON FOR ASSESSMENT:   Consult Hip fracture protocol  ASSESSMENT:   81 y.o. female with PMH significant for DM2, HTN, HLD, CAD/stent, chronic diastolic CHF, complete heart block s/p ppm, A-fib s/p Watchman device, stroke, CKD, h/o GI bleed, chronic anemia, GERD, diverticulosis, obstructive sleep apnea, hypothyroidism, vitamin D deficiency, osteoarthritis of hip.  Patient was brought to the ED on 03/23/2021 after a fall at home from losing her balance. Admitted for left hip fracture.  Patient currently in OR for surgery on left hip fracture.  Per chart review, no reports of issues with appetite.  Will order Ensure Surgery to aid in post-op healing.   Per weight records, weights have been stable.   Medications:Colace, Lovaza, Lactated ringers   Labs reviewed:  CBGs: 159-194  NUTRITION - FOCUSED PHYSICAL EXAM:  Unable to complete, in OR  Diet Order:   Diet Order             Diet NPO time specified  Diet effective now                   EDUCATION NEEDS:   No education needs have been identified at this time  Skin:  Skin Assessment: Reviewed RN Assessment  Last BM:  2/19  Height:   Ht Readings from Last 1 Encounters:  03/23/21 5\' 3"  (1.6 m)    Weight:   Wt Readings from Last 1 Encounters:  03/23/21 81.6 kg    BMI:  Body mass index is 31.89 kg/m.  Estimated Nutritional Needs:   Kcal:  1300-1500  Protein:  65-75g  Fluid:  1.5L/day  Clayton Bibles, MS, RD, LDN Inpatient Clinical Dietitian Contact information available via Amion

## 2021-03-26 ENCOUNTER — Encounter (HOSPITAL_COMMUNITY): Payer: Self-pay | Admitting: Orthopedic Surgery

## 2021-03-26 DIAGNOSIS — E785 Hyperlipidemia, unspecified: Secondary | ICD-10-CM

## 2021-03-26 DIAGNOSIS — Z96642 Presence of left artificial hip joint: Secondary | ICD-10-CM

## 2021-03-26 LAB — CBC WITH DIFFERENTIAL/PLATELET
Abs Immature Granulocytes: 0.05 10*3/uL (ref 0.00–0.07)
Basophils Absolute: 0 10*3/uL (ref 0.0–0.1)
Basophils Relative: 0 %
Eosinophils Absolute: 0 10*3/uL (ref 0.0–0.5)
Eosinophils Relative: 0 %
HCT: 32.2 % — ABNORMAL LOW (ref 36.0–46.0)
Hemoglobin: 10.2 g/dL — ABNORMAL LOW (ref 12.0–15.0)
Immature Granulocytes: 1 %
Lymphocytes Relative: 4 %
Lymphs Abs: 0.3 10*3/uL — ABNORMAL LOW (ref 0.7–4.0)
MCH: 32.1 pg (ref 26.0–34.0)
MCHC: 31.7 g/dL (ref 30.0–36.0)
MCV: 101.3 fL — ABNORMAL HIGH (ref 80.0–100.0)
Monocytes Absolute: 0.3 10*3/uL (ref 0.1–1.0)
Monocytes Relative: 4 %
Neutro Abs: 6.8 10*3/uL (ref 1.7–7.7)
Neutrophils Relative %: 91 %
Platelets: 124 10*3/uL — ABNORMAL LOW (ref 150–400)
RBC: 3.18 MIL/uL — ABNORMAL LOW (ref 3.87–5.11)
RDW: 12.2 % (ref 11.5–15.5)
WBC: 7.4 10*3/uL (ref 4.0–10.5)
nRBC: 0 % (ref 0.0–0.2)

## 2021-03-26 LAB — GLUCOSE, CAPILLARY
Glucose-Capillary: 135 mg/dL — ABNORMAL HIGH (ref 70–99)
Glucose-Capillary: 175 mg/dL — ABNORMAL HIGH (ref 70–99)
Glucose-Capillary: 175 mg/dL — ABNORMAL HIGH (ref 70–99)
Glucose-Capillary: 269 mg/dL — ABNORMAL HIGH (ref 70–99)
Glucose-Capillary: 323 mg/dL — ABNORMAL HIGH (ref 70–99)
Glucose-Capillary: 355 mg/dL — ABNORMAL HIGH (ref 70–99)

## 2021-03-26 LAB — BASIC METABOLIC PANEL
Anion gap: 10 (ref 5–15)
BUN: 27 mg/dL — ABNORMAL HIGH (ref 8–23)
CO2: 20 mmol/L — ABNORMAL LOW (ref 22–32)
Calcium: 8.1 mg/dL — ABNORMAL LOW (ref 8.9–10.3)
Chloride: 105 mmol/L (ref 98–111)
Creatinine, Ser: 1.5 mg/dL — ABNORMAL HIGH (ref 0.44–1.00)
GFR, Estimated: 35 mL/min — ABNORMAL LOW (ref >60)
Glucose, Bld: 226 mg/dL — ABNORMAL HIGH (ref 70–99)
Potassium: 4.3 mmol/L (ref 3.5–5.1)
Sodium: 135 mmol/L (ref 135–145)

## 2021-03-26 MED ORDER — ACETAMINOPHEN 10 MG/ML IV SOLN
1000.0000 mg | Freq: Four times a day (QID) | INTRAVENOUS | Status: AC
  Administered 2021-03-26 (×2): 1000 mg via INTRAVENOUS
  Filled 2021-03-26: qty 100

## 2021-03-26 NOTE — Evaluation (Addendum)
Physical Therapy Evaluation Patient Details Name: Amanda [name] MRN: [id number] DOB: 04-21-1940 Today's Date: 03/26/2021  History of Present Illness  Amanda [name] is an 81 y.o. female s/p L THA posterior hip due to non displaced L intertrochanteric hip fracture in setting of advanced L hip OA and fall at home. PMH: A-fib, complete heart block s/p permanent pacemaker, Watchman device, CKD stage IV, anemia CKD, type 2 diabetes, coronary artery disease, CHF, OSA on CPAP, left hip OA, HTN, MI, NSTEMI   Clinical Impression  Pt is s/p posterior L THA resulting in the deficits listed below (see PT Problem List). Pt ind with RW at baseline for community distances, lives with spouse in single level home with 4 STE and right handrail, daughter lives next door with other daughter in town for a few weeks to assist as needed. Pt reports was planning L THA in May 2023, but due to fall had it done now. Pt currently requiring min-mod A with mobility in room, limited by pain with ambulation. Pt tolerates BLE exercises, educated on performing additional sets/reps throughout the day. Educated pt on time OOB, ambulating to restroom with nursing as able and HEP and pt verbalizes agreement. Pt's goal is to return home with spouse and daughters to assist as needed; nervous about 4 steps to enter home. Pt is currently a high fall risk and unable to attempt steps due to high pain with limited level ground gait. Pt tolerates remaining up in recliner with ice and all needs in reach, notified RN of pain. Pt will benefit from skilled PT to increase their independence and safety with mobility to allow discharge to the venue listed below.         Recommendations for follow up therapy are one component of a multi-disciplinary discharge planning process, led by the attending physician.  Recommendations may be updated based on patient status, additional functional criteria and insurance authorization.  Follow Up Recommendations  Home health PT    Assistance Recommended at Discharge Frequent or constant Supervision/Assistance  Patient can return home with the following  A little help with walking and/or transfers;A little help with bathing/dressing/bathroom;Assistance with cooking/housework;Assist for transportation;Help with stairs or ramp for entrance    Equipment Recommendations None recommended by PT  Recommendations for Other Services       Functional Status Assessment Patient has had a recent decline in their functional status and demonstrates the ability to make significant improvements in function in a reasonable and predictable amount of time.     Precautions / Restrictions Precautions Precautions: Posterior Hip;Fall Restrictions Weight Bearing Restrictions: No LLE Weight Bearing: Weight bearing as tolerated      Mobility  Bed Mobility Overal bed mobility: Needs Assistance Bed Mobility: Supine to Sit  Supine to sit: Mod assist  General bed mobility comments: mod A to mobilize LLE over to EOB and upright trunk into sitting, increased time    Transfers Overall transfer level: Needs assistance Equipment used: Rolling walker (2 wheels) Transfers: Sit to/from Stand, Bed to chair/wheelchair/BSC Sit to Stand: Min assist  Step pivot transfers: Min assist  General transfer comment: min A to power to stand and steady with pivot to BSC, VC for sequencing and RW management    Ambulation/Gait Ambulation/Gait assistance: Min assist Gait Distance (Feet): 15 Feet Assistive device: Rolling walker (2 wheels) Gait Pattern/deviations: Step-to pattern, Decreased stride length, Decreased weight shift to left, Decreased stance time - left Gait velocity: decreased  General Gait Details: step to pattern with decreased  LLE weight-bearing and stance time, increasing to quick short steps with pain, educated on safety with mobility and LE positioning to avoid pivoting/interally rotating LLE  Stairs             Wheelchair Mobility    Modified Rankin (Stroke Patients Only)       Balance Overall balance assessment: Needs assistance Sitting-balance support: Feet supported Sitting balance-Leahy Scale: Fair Sitting balance - Comments: seated EOB   Standing balance support: During functional activity, Bilateral upper extremity supported, Reliant on assistive device for balance Standing balance-Leahy Scale: Poor       Pertinent Vitals/Pain Pain Assessment Pain Assessment: 0-10 Pain Score: 7  Pain Location: L hip Pain Descriptors / Indicators: Sore, Tender Pain Intervention(s): Limited activity within patient's tolerance, Monitored during session, Premedicated before session, Repositioned, Ice applied    Home Living Family/patient expects to be discharged to:: Private residence Living Arrangements: Spouse/significant other Available Help at Discharge: Family;Available 24 hours/day (1 daughter next door, 1 daughter visiting for "a few weeks") Type of Home: House Home Access: Stairs to enter Entrance Stairs-Rails: Right Entrance Stairs-Number of Steps: 4   Home Layout: One level Home Equipment: Conservation officer, nature (2 wheels);Cane - single point;Shower seat;Grab bars - tub/shower      Prior Function Prior Level of Function : Independent/Modified Independent   Mobility Comments: pt reports ind with community ambulation using RW ADLs Comments: pt reports ind with ADLs/IADLs, has spouse to assist if needed, spouse does the driving     Hand Dominance        Extremity/Trunk Assessment   Upper Extremity Assessment Upper Extremity Assessment: Overall WFL for tasks assessed    Lower Extremity Assessment Lower Extremity Assessment: LLE deficits/detail LLE Deficits / Details: ankle AROM WNL, quad set noted with pain, unable to perform SLR, hip abduction/adduction ~50% AROM LLE Sensation: decreased light touch    Cervical / Trunk Assessment Cervical / Trunk Assessment: Kyphotic   Communication   Communication: No difficulties  Cognition Arousal/Alertness: Awake/alert Behavior During Therapy: WFL for tasks assessed/performed Overall Cognitive Status: Within Functional Limits for tasks assessed       General Comments      Exercises Total Joint Exercises Ankle Circles/Pumps: Seated, AROM, Both, 20 reps Quad Sets: Seated, AROM, Strengthening, Both, 10 reps   Assessment/Plan    PT Assessment Patient needs continued PT services  PT Problem List Decreased strength;Decreased range of motion;Decreased activity tolerance;Decreased balance;Decreased mobility;Obesity;Pain       PT Treatment Interventions DME instruction;Gait training;Stair training;Functional mobility training;Therapeutic activities;Therapeutic exercise;Balance training;Patient/family education    PT Goals (Current goals can be found in the Care Plan section)  Acute Rehab PT Goals Patient Stated Goal: home with family to assist PT Goal Formulation: With patient/family Time For Goal Achievement: 04/09/21 Potential to Achieve Goals: Good    Frequency Min 5X/week     Co-evaluation               AM-PAC PT "6 Clicks" Mobility  Outcome Measure Help needed turning from your back to your side while in a flat bed without using bedrails?: A Little Help needed moving from lying on your back to sitting on the side of a flat bed without using bedrails?: A Little Help needed moving to and from a bed to a chair (including a wheelchair)?: A Little Help needed standing up from a chair using your arms (e.g., wheelchair or bedside chair)?: A Little Help needed to walk in hospital room?: A Lot Help needed climbing 3-5 steps with  a railing? : Total 6 Click Score: 15    End of Session Equipment Utilized During Treatment: Gait belt Activity Tolerance: Patient tolerated treatment well;Patient limited by pain Patient left: in chair;with call bell/phone within reach;with family/visitor present Nurse  Communication: Mobility status;Patient requests pain meds PT Visit Diagnosis: Unsteadiness on feet (R26.81);Other abnormalities of gait and mobility (R26.89);Muscle weakness (generalized) (M62.81)    Time: 4290-3795 PT Time Calculation (min) (ACUTE ONLY): 51 min   Charges:   PT Evaluation $PT Eval Low Complexity: 1 Low PT Treatments $Therapeutic Exercise: 8-22 mins $Therapeutic Activity: 8-22 mins         Tori Jamerica Snavely PT, DPT 03/26/21, 1:35 PM

## 2021-03-26 NOTE — Plan of Care (Signed)

## 2021-03-26 NOTE — Progress Notes (Signed)
Subjective: 1 Day Post-Op Procedure(s) (LRB): LEFT POSTERIOR TOTAL HIP  ZIMMER CABLES (Left) Patient reports pain as mild.   Patient seen in rounds with Dr. Alvan Dame. Patient is resting in bed on exam this morning with her daughter at the bedside. No acute events overnight. She reports her pain is controlled. Dr. Alvan Dame reviewed pre-op and post-op x-rays with the patient and her family, as well as general recovery expectations.  We will start therapy today.   Objective: Vital signs in last 24 hours: Temp:  [97.7 F (36.5 C)-99.3 F (37.4 C)] 98.4 F (36.9 C) (02/22 0624) Pulse Rate:  [52-83] 77 (02/22 0624) Resp:  [10-20] 20 (02/22 0624) BP: (102-179)/(44-99) 174/85 (02/22 0624) SpO2:  [90 %-100 %] 98 % (02/22 0624)  Intake/Output from previous day:  Intake/Output Summary (Last 24 hours) at 03/26/2021 0812 Last data filed at 03/26/2021 0536 Gross per 24 hour  Intake 4143.46 ml  Output 1225 ml  Net 2918.46 ml     Intake/Output this shift: No intake/output data recorded.  Labs: Recent Labs    03/23/21 2146 03/25/21 0110 03/25/21 2000 03/26/21 0407  HGB 13.3 11.3* 11.1* 10.2*   Recent Labs    03/25/21 2000 03/26/21 0407  WBC 6.8 7.4  RBC 3.39* 3.18*  HCT 35.0* 32.2*  PLT 127* 124*   Recent Labs    03/25/21 2000 03/26/21 0407  NA 136 135  K 4.6 4.3  CL 103 105  CO2 23 20*  BUN 30* 27*  CREATININE 1.56* 1.50*  GLUCOSE 222* 226*  CALCIUM 8.4* 8.1*   No results for input(s): LABPT, INR in the last 72 hours.  Exam: General - Patient is Alert and Oriented Extremity - Neurologically intact Sensation intact distally Intact pulses distally Dorsiflexion/Plantar flexion intact Dressing - dressing C/D/I Motor Function - intact, moving foot and toes well on exam.   Past Medical History:  Diagnosis Date   Acute ischemic stroke ([place])    Acute metabolic encephalopathy    Acute on chronic systolic (congestive) heart failure (HCC)    Acute renal insufficiency  09/02/2016   Anemia due to stage 4 chronic kidney disease ([place]) 04/04/2015   Atrial fibrillation (HCC)    Benign hypertension with CKD (chronic kidney disease) stage IV ([place]) 04/04/2015   Bradycardia 09/17/2014   Chest pain 06/23/2016   Overview:  Added automatically from request for surgery [id number]   Chronic anemia    Chronic kidney disease (CKD), stage III (moderate) ([place])    Coronary artery disease 10/12/2016   Non drug-eluting stent implanted in June 2018 to mid RCA   08/08/2016 10:37  Angiographic Findings  Cardiac Arteries and Lesion Findings LMCA: 0% and Normal. LAD: 0% and Normal. RCA:   Lesion on Mid RCA: Mid subsection.95% stenosis reduced to 0%. Pre procedure   TIMI II flow was noted. Post Procedure TIMI III flow was present. Poor run   off was present. The lesion was diagnosed as High Risk (C).    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 ([place]) 04/04/2015   Diabetes mellitus without complication ([place])    Diverticulosis    Dyslipidemia 09/17/2014   Gastroesophageal reflux    GI bleed 08/06/2016   Heart block AV complete ([place]) 09/05/2019   History of cardiac monitoring 02/23/2018   monitor inserted   Hyperlipidemia    Hypertensive heart disease with heart failure (HCC)    Iron deficiency anemia 12/28/2017   LV dysfunction 09/02/2016   Myocardial infarction ([place])  NSTEMI (non-ST elevated myocardial infarction) ([place]) 08/06/2016   Obstructive sleep apnea 09/17/2014   Orthostatic hypotension 12/28/2017   OSA on CPAP    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   Postural dizziness with presyncope 09/23/2017   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/24/2017   Recurrent left pleural effusion 03/14/2015   Renal failure, chronic, stage 3 (moderate) (HCC)    Syncope 71/0/6269   Systolic heart failure ([place])    Thyroid disease    Vitamin D deficiency 04/04/2015    Assessment/Plan: 1 Day Post-Op Procedure(s) (LRB): LEFT POSTERIOR TOTAL HIP  ZIMMER CABLES  (Left) Principal Problem:   Closed left hip fracture (HCC) Active Problems:   Anemia due to stage 4 chronic kidney disease (HCC)   Atrial fibrillation (HCC)   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)   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   Weight as of this encounter: 81.6 kg. Advance diet Up with therapy  DVT Prophylaxis - Aspirin 81 mg BID x4 weeks Weight bearing as tolerated. Dr. Alvan Dame does not feel we need to adhere to posterior hip precautions for this patient  Hgb stable at 10.2 this AM Cr. 1.5, in the setting of known CKD  Plan is to go Home after hospital stay. Patient is on the medical service. She will likely need another night in the hospital to ensure she is medically optimized as well as meeting goals with PT. I would anticipate she may discharge home tomorrow if she is medically stable for discharge at that time.   Griffith Citron, PA-C Orthopedic Surgery [phone] 03/26/2021, 8:12 AM

## 2021-03-26 NOTE — Op Note (Signed)
[name], [name] MEDICAL RECORD NO: [id number] ACCOUNT NO: [id number] DATE OF BIRTH: 05/30/1940 FACILITY: [facility]ATION: [id number] PHYSICIAN: Pietro Cassis. Alvan Dame, MD  Operative Report   DATE OF PROCEDURE: 03/25/2021  PREOPERATIVE DIAGNOSIS:  Nondisplaced left intertrochanteric femur fracture with history of advanced left hip osteoarthritis, scheduled to have a left total hip replacement electively in the end of 06/2021.  POSTOPERATIVE DIAGNOSIS:  Nondisplaced left intertrochanteric femur fracture with history of advanced left hip osteoarthritis, scheduled to have a left total hip replacement electively in the end of 06/2021.  PROCEDURES:   1.  Open reduction and internal fixation of left intertrochanteric femur fracture with a Zimmer cable. 2.  Left total hip arthroplasty.  COMPONENTS USED:  A DePuy 52 mm Pinnacle shell with a 36+4 neutral AltrX liner.  A size 16.5 small stature AML fully porous coated prosthetic stem with a 36-2 Articul/eze metal head ball.  SURGEON:  Pietro Cassis. Alvan Dame, MD.  ASSISTANT:  Costella Hatcher, PA-C.  Note that Ms. Amanda [name] was present for the entirety of the case from preoperative positioning, perioperative management of the operative extremity, general facilitation of the case and primary wound closure.  ANESTHESIA:  Spinal.  ESTIMATED BLOOD LOSS:  About 150 mL  DRAINS:  None.  COMPLICATIONS:  None.  INDICATIONS FOR PROCEDURE:  The patient is an 81 year old female who presented to the Emergency Room on the evening of 03/23/2021.  She had a fall in her kitchen when she lost her balance around her stove.  She fell onto her left hip.  She had immediate  onset of pain on top of chronic left hip pain.  She was brought to the Emergency Room.  Radiographs revealed a nondisplaced intertrochanteric femur fracture.  She had advanced left hip osteoarthritis.  Upon discussion and review with the patient the  condition of her left hip, she reported that she was scheduled  to have her left hip replaced by my partner, Dr. Wynelle Link, in the end of 06/2021.  Given the findings radiographically of this new fracture in the setting of arthritis, I discussed with her a  plan of managing not only the intertrochanteric femur fracture, but addressing her hip arthritis by performing a total hip replacement following stabilization of intertrochanteric femur fracture.  We reviewed the risks of infection, DVT, the risk of  nonunion, malunion, and need for future surgeries related to the prosthetic component and the fracture identified.  Consent was obtained for benefit of pain relief.  DESCRIPTION OF PROCEDURE:  The patient was brought to the operative theater.  Once adequate anesthesia, preoperative antibiotics, Ancef administered, she was positioned into the right lateral decubitus position with the left hip up.  The left lower  extremity was then prepped and draped in sterile fashion.  A timeout was performed identifying the patient, planned procedure, and extremity.  Landmarks were identified.  An incision was made for posterior approach to the hip as well as to allow for  exposure of the proximal femur for stabilization of her proximal femur fracture identified radiographically.  The iliotibial band and gluteal fascia were incised to allow for posterior approach.  I exposed the posterior hip in a routine fashion extending  the exposure to the area below the lesser trochanter.  At this point, there was no obvious displacement of the fracture during our preparation of her extremity.  I then passed a cable around the proximal femur proximal to the lesser trochanter and  provisionally tightened it.  Once this was done, I was able  to fully expose the posterior aspect of the hip.  As we attempted to dislocate the hip, there appeared to be displacement at the intertrochanteric segment.  For that reason, I stopped at this  process.  I then removed the femoral head by creating a napkin ring type  resection of her femoral neck and then removed the femoral head.  Once the femoral head was removed, I was able to mobilize her femur in a routine fashion while protecting the  fracture segment.  I attempted first to the femoral preparation.  We opened up the proximal femur with a starting drill.  We then began reaming.  As I reamed, we reamed up to 16 mm and then identified that the 16.5 mm would sit proud by the requisite 4  to 5 cm for bone ingrowth.  I then broached first with a 15.5 small stature and then the 16.5 small stature broaches.  During this process, we did readily identify that the proximal bone stock was weak and that the selection of component and technique  was appropriate.  The intertrochanteric fracture line was visible extending below the lesser trochanter.  Once I prepared the femur as noted, I attended to the acetabular preparation.  Acetabular exposure was obtained and soft tissues debrided.  I began  reaming with a 45 mm reamer and reamed to 51 mm.  I used a 52 mm cup.  The final 52 mm Pinnacle shell was opened and impacted.  According to evaluation of her anatomy within the acetabulum, her cup appeared to be positioned appropriately below the rim of  the acetabulum anteriorly with a portion of the cup exposed posteriorly.  Using the hip guide, her abduction appeared to be between 35 and 40 degrees and forward flexion was at least 20 degrees.  I placed a cancellous screw into the ilium and then  placed a trial liner given the nature of this posterior approach.  At this point, I then placed the femoral broach and a trial neck and trial with a 36+1.5 ball.  I found the combined anteversion at this point was roughly 50 degrees and I did not see any  signs of impingement with forward flexion, internal rotation or extension.  Given these findings, we removed all the trial components including the trial liner.  A final 36+4 neutral AltrX liner was impacted into position.  The final 16.5  small stature  stem was then opened.  Following irrigation of the canal, impacted the femoral component to the level of the prepared neck cut based on the trial broaching.  Once this was impacted and well seated, I retrialed and based on the evaluation of again the  combined anteversion as well as the range of motion of the hip as well as leg lengths, I elected to use a 36-2 ball.  The final 36-2 Articul/eze metal head ball was then impacted on a clean and dried trunnion and the hip was reduced.  The hip was  irrigated throughout the case and again at this point.  I did reapproximate the posterior soft tissues using #1 Vicryl sutures.  The remainder of the wound was closed in layers using #1 Vicryl and #1 Stratafix suture on the iliotibial band and gluteal  fascia.  The remainder of the wound was closed in layers with 2-0 Vicryl and a running Monocryl stitch.  The hip wound was then cleaned, dried and dressed sterilely using surgical glue and Aquacel dressing.  She was then brought to the recovery room  in  stable condition, tolerated the procedure well.  Findings reviewed with her husband.  Postoperatively, she will be weightbearing as tolerated.  She will remain in the hospital for medical optimization prior to discharge home.  We will see her back in the  office in 2 weeks.      PAA D: 03/25/2021 4:23:56 pm T: 03/26/2021 1:26:00 am  JOB: [id number]/ [id number]

## 2021-03-26 NOTE — Progress Notes (Signed)
End of shift note.    Pt ambulated with PT and did well.  Spent most of the day in the chair.  Ambulated to [facility], 1 assist.  Pain increases with movement, pt has ice packs for incision sites.  Pt A & Ox4 and pleasant. Would anticipate d/c tomorrow but there are 4 steps in her home and PT stated they need to work with her on steps before she could be discharged.

## 2021-03-26 NOTE — Progress Notes (Signed)
PROGRESS NOTE  Amanda [name] STM:[id number] DOB: 01/13/1941 DOA: 03/23/2021 PCP: Venetia Maxon, Sharon Mt, MD  Brief History   Amanda [name] is a 81 y.o. female with PMH significant for DM2, HTN, HLD, CAD/stent, chronic diastolic CHF, complete heart block s/p ppm, A-fib s/p Watchman device, stroke, CKD, h/o GI bleed, chronic anemia, GERD, diverticulosis, obstructive sleep apnea, hypothyroidism, vitamin D deficiency, osteoarthritis of hip. Patient was brought to the ED on 03/23/2021 after a fall at home from losing her balance.  She landed on her left hip and started having severe pain.   In the ED, x-ray of left hip showed intertrochanteric fracture. Lab with BUN/creatinine 38/2.6, glucose 234, hemoglobin 13.3 EDP admitted to hospitalist service Orthopedics consulted The patient underwent ORIF on 03/25/2021. She has tolerated the procedure well.  She has worked with PT this morning. She has done well with them.  Plan is for discharge to home tomorrow.  Consultants  Orthopedic surgery  Procedures  ORIF left hip  Antibiotics   Anti-infectives (From admission, onward)    Start     Dose/Rate Route Frequency Ordered Stop   03/25/21 2000  ceFAZolin (ANCEF) IVPB 2g/100 mL premix        2 g 200 mL/hr over 30 Minutes Intravenous Every 6 hours 03/25/21 1818 03/26/21 0305   03/25/21 1945  ceFAZolin (ANCEF) IVPB 2g/100 mL premix        2 g 200 mL/hr over 30 Minutes Intravenous On call to O.R. 03/25/21 1933 03/26/21 0559   03/25/21 1300  ceFAZolin (ANCEF) IVPB 2g/100 mL premix        2 g 200 mL/hr over 30 Minutes Intravenous On call to O.R. 03/24/21 1835 03/25/21 1433      Subjective  The patient is resting comfortably. No new complaints.  Objective   Vitals:  Vitals:   03/26/21 1056 03/26/21 1155  BP: (!) 160/63 (!) 156/75  Pulse: 66 65  Resp: 18 18  Temp: 98.2 F (36.8 C) 98.3 F (36.8 C)  SpO2: 94% 90%    Exam:  Constitutional:  The patient is awake, alert, and  oriented x 3. No acute distress. Respiratory:  No increased work of breathing. No wheezes, rales, or rhonchi No tactile fremitus Cardiovascular:  Regular rate and rhythm No murmurs, ectopy, or gallups. No lateral PMI. No thrills. Abdomen:  Abdomen is soft, non-tender, non-distended No hernias, masses, or organomegaly Normoactive bowel sounds.  Musculoskeletal:  No cyanosis, clubbing, or edema Skin:  No rashes, lesions, ulcers palpation of skin: no induration or nodules Neurologic:  CN 2-12 intact Sensation all 4 extremities intact Psychiatric:  Mental status Mood, affect appropriate Orientation to person, place, time  judgment and insight appear intact   I have personally reviewed the following:   Today's Data  Vitals  Lab Data  BMP CBC  Micro Data  Urine culture - no growth  Imaging  X-ray of the pelvis  Cardiology Data  EKG  Other Data    Scheduled Meds:  acidophilus  1 capsule Oral Q1200   amiodarone  200 mg Oral Daily   aspirin  81 mg Oral BID   docusate sodium  100 mg Oral BID   ezetimibe  10 mg Oral QPM   famotidine  10 mg Oral Daily   febuxostat  40 mg Oral QHS   feeding supplement  237 mL Oral BID BM   ferrous sulfate  325 mg Oral TID PC   insulin aspart  0-5 Units Subcutaneous QHS   insulin aspart  0-9 Units Subcutaneous TID WC   isosorbide mononitrate  30 mg Oral QHS   levothyroxine  50 mcg Oral QAC breakfast   omega-3 acid ethyl esters  2 g Oral Q1200   pantoprazole  40 mg Oral Daily   polyethylene glycol  17 g Oral QODAY   povidone-iodine  2 application Topical Once   pravastatin  40 mg Oral QHS   ranolazine  500 mg Oral BID   Continuous Infusions:  sodium chloride 75 mL/hr at 03/26/21 1608   lactated ringers Stopped (03/25/21 2028)   lactated ringers     tranexamic acid      Principal Problem:   Closed left hip fracture (HCC) Active Problems:   Anemia due to stage 4 chronic kidney disease (HCC)   Atrial fibrillation (HCC)    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)   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 -Pt underwent ORIF on 03/25/2021. She has tolerated the procedure well. She has been evaluated by PT. She did well with PT. Recommendation is for disposition to home with home health.   Chronic diastolic CHF Essential hypertension -Follows up with Dr. Agustin Cree at [facility].  Last echo from 12/2020 with EF 60 to 65%, severe concentric LVH, mildly elevated pulmonary artery pressure -Home meds include Lasix 40 mg twice daily, Imdur 30 mg daily. -Currently euvolemic. -Continue Imdur.  In preparation of surgery, I held Lasix this morning.  Because of creatinine trending up, start on gentle IV hydration with NS at 75 mill per hour perioperatively. -IV fluids has been stopped.   Permanent A-fib Complete heart block s/p PPM and Watchman device -Continue amiodarone 200 mg daily -Not on antiplatelet/anticoagulant because of history of GI bleeding -Heart rate is controlled   History of CAD/stents -Currently no anginal symptoms.   -Home meds include Ranexa, Imdur. -Not on antiplatelet/anticoagulant because of history of GI bleeding   Dyslipidemia -Continue Repatha, Zetia, Lovaza, Pravachol.   Type 2 diabetes mellitus -A1c pending -I do not see any antidiabetic medications in her home list -Currently on sliding scale insulin with Accu-Cheks -Blood sugar level consistently less than 200 Recent Labs  HbA1c is 7.3.   Last Labs          Recent Labs  Lab 03/24/21 1415 03/24/21 1659 03/24/21 2051 03/25/21 0731  GLUCAP 146* 194* 189* 159*      CKD 4 -Baseline creatinine less than 1.8.  Creatinine is 1.5.    -Follows with nephrologist at [facility] creatinine, electrolytes, and volume status. Recent Labs (within last 365 days)            Recent Labs    05/16/20 0902 05/16/20 0924 06/10/20 1508 06/17/20 1047 08/28/20 0000 09/06/20 0938 03/23/21 2146 03/25/21 0110  BUN 31* 44* 49* 50* 57* 43* 38* 38*  CREATININE 1.00 1.60* 1.64* 1.78* 2.28* 1.70* 1.63* 1.89*      Chronic anemia -Hemoglobin stable at baseline over 10. Hemoglobin at 10.2 this morning. -Continue supplementation with iron, folic acid  Recent Labs (within last 365 days)         Recent Labs    05/16/20 0924 08/28/20 0000 09/06/20 0938 03/23/21 2146 03/25/21 0110  HGB 10.5* 11.4 10.9* 13.3 11.3*  MCV  --  97  --  99.5 100.9*      GERD -Continue Protonix.  Also on Pepcid   History of gout -Continue Uloric   Hypothyroidism -Continue Synthroid   OSA on CPAP Continue CPAP   Osteoarthritis -Patient was planned for left hip replacement in May 2023.  Coincidentally, now she has fracture on the same hip.  I have seen and examined this patient myself. I have spent 34 minutes in her evaluation and care.  DVT prophylaxis: SCD's Code Status: Full Code Family Communication: Family was at bedside Disposition Plan: To home with home health PT/OT   Gordana Kewley, DO [facility] Direct contact: see [email or web address]  7PM-7AM contact night coverage as above 03/26/2021, 6:11 PM  LOS: 3 days

## 2021-03-27 LAB — BASIC METABOLIC PANEL
Anion gap: 7 (ref 5–15)
BUN: 44 mg/dL — ABNORMAL HIGH (ref 8–23)
CO2: 23 mmol/L (ref 22–32)
Calcium: 8.7 mg/dL — ABNORMAL LOW (ref 8.9–10.3)
Chloride: 107 mmol/L (ref 98–111)
Creatinine, Ser: 1.72 mg/dL — ABNORMAL HIGH (ref 0.44–1.00)
GFR, Estimated: 30 mL/min — ABNORMAL LOW (ref >60)
Glucose, Bld: 247 mg/dL — ABNORMAL HIGH (ref 70–99)
Potassium: 4.9 mmol/L (ref 3.5–5.1)
Sodium: 137 mmol/L (ref 135–145)

## 2021-03-27 LAB — CBC WITH DIFFERENTIAL/PLATELET
Abs Immature Granulocytes: 0.08 10*3/uL — ABNORMAL HIGH (ref 0.00–0.07)
Basophils Absolute: 0 10*3/uL (ref 0.0–0.1)
Basophils Relative: 0 %
Eosinophils Absolute: 0 10*3/uL (ref 0.0–0.5)
Eosinophils Relative: 0 %
HCT: 28.2 % — ABNORMAL LOW (ref 36.0–46.0)
Hemoglobin: 9.3 g/dL — ABNORMAL LOW (ref 12.0–15.0)
Immature Granulocytes: 1 %
Lymphocytes Relative: 4 %
Lymphs Abs: 0.4 10*3/uL — ABNORMAL LOW (ref 0.7–4.0)
MCH: 32.7 pg (ref 26.0–34.0)
MCHC: 33 g/dL (ref 30.0–36.0)
MCV: 99.3 fL (ref 80.0–100.0)
Monocytes Absolute: 0.7 10*3/uL (ref 0.1–1.0)
Monocytes Relative: 8 %
Neutro Abs: 7.8 10*3/uL — ABNORMAL HIGH (ref 1.7–7.7)
Neutrophils Relative %: 87 %
Platelets: 145 10*3/uL — ABNORMAL LOW (ref 150–400)
RBC: 2.84 MIL/uL — ABNORMAL LOW (ref 3.87–5.11)
RDW: 12.5 % (ref 11.5–15.5)
WBC: 9 10*3/uL (ref 4.0–10.5)
nRBC: 0.2 % (ref 0.0–0.2)

## 2021-03-27 LAB — VITAMIN B12: Vitamin B-12: 1173 pg/mL — ABNORMAL HIGH (ref 180–914)

## 2021-03-27 LAB — IRON AND TIBC
Iron: 40 ug/dL (ref 28–170)
Saturation Ratios: 18 % (ref 10.4–31.8)
TIBC: 218 ug/dL — ABNORMAL LOW (ref 250–450)
UIBC: 178 ug/dL

## 2021-03-27 LAB — FOLATE: Folate: 16.8 ng/mL (ref >5.9)

## 2021-03-27 LAB — GLUCOSE, CAPILLARY
Glucose-Capillary: 226 mg/dL — ABNORMAL HIGH (ref 70–99)
Glucose-Capillary: 241 mg/dL — ABNORMAL HIGH (ref 70–99)
Glucose-Capillary: 221 mg/dL — ABNORMAL HIGH (ref 70–99)
Glucose-Capillary: 288 mg/dL — ABNORMAL HIGH (ref 70–99)

## 2021-03-27 LAB — RETICULOCYTES
Immature Retic Fract: 30 % — ABNORMAL HIGH (ref 2.3–15.9)
RBC.: 2.81 MIL/uL — ABNORMAL LOW (ref 3.87–5.11)
Retic Count, Absolute: 94.4 10*3/uL (ref 19.0–186.0)
Retic Ct Pct: 3.4 % — ABNORMAL HIGH (ref 0.4–3.1)

## 2021-03-27 LAB — FERRITIN: Ferritin: 427 ng/mL — ABNORMAL HIGH (ref 11–307)

## 2021-03-27 MED ORDER — SODIUM CHLORIDE 0.9 % IV SOLN
1.0000 g | INTRAVENOUS | Status: DC
  Administered 2021-03-27 – 2021-03-31 (×5): 1 g via INTRAVENOUS
  Filled 2021-03-27 (×5): qty 10

## 2021-03-27 MED ORDER — HYDRALAZINE HCL 25 MG PO TABS
25.0000 mg | ORAL_TABLET | Freq: Four times a day (QID) | ORAL | Status: DC
  Administered 2021-03-27 – 2021-03-28 (×4): 25 mg via ORAL
  Filled 2021-03-27 (×4): qty 1

## 2021-03-27 NOTE — Progress Notes (Signed)
Physical Therapy Treatment Patient Details Name: Amanda [name] MRN: [id number] DOB: 05/29/1940 Today's Date: 03/27/2021   History of Present Illness Amanda [name] is an 81 y.o. female s/p L THA posterior hip due to non displaced L intertrochanteric hip fracture in setting of advanced L hip OA and fall at home. PMH: A-fib, complete heart block s/p permanent pacemaker, Watchman device, CKD stage IV, anemia CKD, type 2 diabetes, coronary artery disease, CHF, OSA on CPAP, left hip OA, HTN, MI, NSTEMI    PT Comments    Pt is POD #2 s/p L THA resulting in the deficits listed below (see PT Problem List). Pt performed sit to stand transfers with MIN guard for safety and cues for safe hand placement. Pt ambulated total of ~111ft with MIN guard progressing to supervision for safety. Cues provided for step to gait patten with no LOB observed. Pt performed stair negotiation with cues for proper sequencing and pt's daughter present and demonstrating proper guarding position for assisting at home. PT reviewed LE HEP for promotion of DVT prevention and improved strength/ROM, pt demonstrated understanding. Continue to recommend howe with HHPT and family assist. Pt will benefit from skilled PT to maximize functional mobility to increase independence.     Recommendations for follow up therapy are one component of a multi-disciplinary discharge planning process, led by the attending physician.  Recommendations may be updated based on patient status, additional functional criteria and insurance authorization.  Follow Up Recommendations  Home health PT     Assistance Recommended at Discharge Frequent or constant Supervision/Assistance  Patient can return home with the following A little help with walking and/or transfers;A little help with bathing/dressing/bathroom;Assistance with cooking/housework;Assist for transportation;Help with stairs or ramp for entrance   Equipment Recommendations  None recommended by  PT    Recommendations for Other Services       Precautions / Restrictions Precautions Precautions: Posterior Hip;Fall Restrictions Weight Bearing Restrictions: No LLE Weight Bearing: Weight bearing as tolerated     Mobility  Bed Mobility               General bed mobility comments: Pt OOB in recliner upon entry    Transfers Overall transfer level: Needs assistance Equipment used: Rolling walker (2 wheels) Transfers: Sit to/from Stand Sit to Stand: Min guard           General transfer comment: MIN guard for safety, use of B UEs for power up    Ambulation/Gait Ambulation/Gait assistance: Min guard, Supervision Gait Distance (Feet): 60 Feet Assistive device: Rolling walker (2 wheels) Gait Pattern/deviations: Step-to pattern, Decreased stride length, Decreased weight shift to left, Decreased stance time - left Gait velocity: decreased     General Gait Details: additional 30ft following stair training. step to pattern with decreased LLE weight-bearing, no overt LOB observed. increased discomfort with ambulation   Stairs Stairs: Yes Stairs assistance: Min assist Stair Management: One rail Right, Step to pattern, Forwards Number of Stairs: 4 General stair comments: daughter present during stair training and able to demonstrate safe guarding position following cues form therapist. Pt able to negtoiate stairs with single railing and HHA on opposite side for B UE support. Also review use of HHA for home or use of cane to assist with stairs as well for improved stability, pt and daughter verbalized understanding.   Wheelchair Mobility    Modified Rankin (Stroke Patients Only)       Balance Overall balance assessment: Needs assistance Sitting-balance support: Feet supported Sitting balance-Leahy Scale:  Fair     Standing balance support: During functional activity, Bilateral upper extremity supported, Reliant on assistive device for balance Standing  balance-Leahy Scale: Poor                              Cognition Arousal/Alertness: Awake/alert Behavior During Therapy: WFL for tasks assessed/performed Overall Cognitive Status: Within Functional Limits for tasks assessed                                          Exercises Total Joint Exercises Ankle Circles/Pumps: Seated, AROM, Both, 20 reps Quad Sets: Seated, AROM, Strengthening, Both, 10 reps Short Arc Quad: AROM, Left, 10 reps, Seated    General Comments        Pertinent Vitals/Pain Pain Assessment Pain Assessment: 0-10 Pain Score: 7  Pain Location: L hip Pain Descriptors / Indicators: Sore, Tender, Burning Pain Intervention(s): Limited activity within patient's tolerance, Monitored during session, Repositioned, Ice applied    Home Living                          Prior Function            PT Goals (current goals can now be found in the care plan section) Acute Rehab PT Goals Patient Stated Goal: home with family to assist PT Goal Formulation: With patient/family Time For Goal Achievement: 04/09/21 Potential to Achieve Goals: Good Progress towards PT goals: Progressing toward goals    Frequency    Min 5X/week      PT Plan Current plan remains appropriate    Co-evaluation              AM-PAC PT "6 Clicks" Mobility   Outcome Measure  Help needed turning from your back to your side while in a flat bed without using bedrails?: A Little Help needed moving from lying on your back to sitting on the side of a flat bed without using bedrails?: A Little Help needed moving to and from a bed to a chair (including a wheelchair)?: A Little Help needed standing up from a chair using your arms (e.g., wheelchair or bedside chair)?: A Little Help needed to walk in hospital room?: A Little Help needed climbing 3-5 steps with a railing? : A Little 6 Click Score: 18    End of Session Equipment Utilized During Treatment:  Gait belt Activity Tolerance: Patient tolerated treatment well Patient left: in chair;with call bell/phone within reach;with family/visitor present Nurse Communication: Mobility status PT Visit Diagnosis: Unsteadiness on feet (R26.81);Other abnormalities of gait and mobility (R26.89);Muscle weakness (generalized) (M62.81);Pain Pain - Right/Left: Left Pain - part of body: Hip     Time: 9038-3338 PT Time Calculation (min) (ACUTE ONLY): 34 min  Charges:  $Therapeutic Exercise: 8-22 mins $Therapeutic Activity: 8-22 mins                     Festus Barren PT, DPT  Acute Rehabilitation Services  Office [phone]    03/27/2021, 3:20 PM

## 2021-03-27 NOTE — TOC Progression Note (Signed)
Transition of Care [facility]) - Progression Note    Patient Details  Name: Amanda [name] MRN: [id number] Date of Birth: 1941-01-08  Transition of Care [facility]) CM/SW Contact  Purcell Mouton, RN Phone Number: 03/27/2021, 1:30 PM  Clinical Narrative:     Spoke with pt, spouse and daughter at bedside. [facility] was selected however, they are unable to service pt until 2 weeks. [facility] was selected, Referral was given to in house rep.   Expected Discharge Plan: [place] Barriers to Discharge: No Barriers Identified  Expected Discharge Plan and Services Expected Discharge Plan: [place] Choice: [place] arrangements for the past 2 months: Single Family Home                           HH Arranged: RN, PT [facility] Agency: [place] Date [place]: 03/27/21 Time [place]: 1330 Representative spoke with at [place]: Tommi Rumps, RN   Social Determinants of Health ([place]) Interventions    Readmission Risk Interventions No flowsheet data found.

## 2021-03-27 NOTE — Progress Notes (Signed)
PROGRESS NOTE  Amanda [name] FWY:[id number] DOB: [birth year] DOA: 03/23/2021 PCP: Venetia Maxon, Sharon [place], MD  Brief History   Amanda [name] is a 81 y.o. female with PMH significant for DM2, HTN, HLD, CAD/stent, chronic diastolic CHF, complete heart block s/p ppm, A-fib s/p Watchman device, stroke, CKD, h/o GI bleed, chronic anemia, GERD, diverticulosis, obstructive sleep apnea, hypothyroidism, vitamin D deficiency, osteoarthritis of hip. Patient was brought to the ED on 03/23/2021 after a fall at home from losing her balance.  She landed on her left hip and started having severe pain.   In the ED, x-ray of left hip showed intertrochanteric fracture. Lab with BUN/creatinine 38/2.6, glucose 234, hemoglobin 13.3 EDP admitted to hospitalist service Orthopedics consulted The patient underwent ORIF on 03/25/2021. She has tolerated the procedure well.  She has worked with PT this morning. She has done well with them, but has not met necessary goals for safety on discharge to home. Orthopedic surgery has cleared her for discharge to home when these goals are met. I have discussed this with PT.  Plan is for discharge to home with home health PT.  Consultants  Orthopedic surgery  Procedures  ORIF left hip  Antibiotics   Anti-infectives (From admission, onward)    Start     Dose/Rate Route Frequency Ordered Stop   03/27/21 1030  cefTRIAXone (ROCEPHIN) 1 g in sodium chloride 0.9 % 100 mL IVPB        1 g 200 mL/hr over 30 Minutes Intravenous Every 24 hours 03/27/21 0936     03/25/21 2000  ceFAZolin (ANCEF) IVPB 2g/100 mL premix        2 g 200 mL/hr over 30 Minutes Intravenous Every 6 hours 03/25/21 1818 03/26/21 0305   03/25/21 1945  ceFAZolin (ANCEF) IVPB 2g/100 mL premix        2 g 200 mL/hr over 30 Minutes Intravenous On call to O.R. 03/25/21 1933 03/26/21 0559   03/25/21 1300  ceFAZolin (ANCEF) IVPB 2g/100 mL premix        2 g 200 mL/hr over 30 Minutes Intravenous On call to O.R.  03/24/21 1835 03/25/21 1433      Subjective  The patient is resting comfortably. No new complaints.  Objective   Vitals:  Vitals:   03/27/21 0615 03/27/21 1428  BP: (!) 181/76 (!) 155/64  Pulse: 71 69  Resp: 18 20  Temp: 98.2 F (36.8 C) (!) 97 F (36.1 C)  SpO2: 91% 93%    Exam:  Constitutional:  The patient is awake, alert, and oriented x 3. No acute distress. Respiratory:  No increased work of breathing. No wheezes, rales, or rhonchi No tactile fremitus Cardiovascular:  Regular rate and rhythm No murmurs, ectopy, or gallups. No lateral PMI. No thrills. Abdomen:  Abdomen is soft, non-tender, non-distended No hernias, masses, or organomegaly Normoactive bowel sounds.  Musculoskeletal:  No cyanosis, clubbing, or edema Skin:  No rashes, lesions, ulcers palpation of skin: no induration or nodules Neurologic:  CN 2-12 intact Sensation all 4 extremities intact Psychiatric:  Mental status Mood, affect appropriate Orientation to person, place, time  judgment and insight appear intact   I have personally reviewed the following:   Today's Data  Vitals  Lab Data  BMP CBC  Micro Data  Urine culture - no growth  Imaging  X-ray of the pelvis  Cardiology Data  EKG  Other Data    Scheduled Meds:  acidophilus  1 capsule Oral Q1200   amiodarone  200 mg Oral  Daily   aspirin  81 mg Oral BID   docusate sodium  100 mg Oral BID   ezetimibe  10 mg Oral QPM   famotidine  10 mg Oral Daily   febuxostat  40 mg Oral QHS   feeding supplement  237 mL Oral BID BM   ferrous sulfate  325 mg Oral TID PC   insulin aspart  0-5 Units Subcutaneous QHS   insulin aspart  0-9 Units Subcutaneous TID WC   isosorbide mononitrate  30 mg Oral QHS   levothyroxine  50 mcg Oral QAC breakfast   omega-3 acid ethyl esters  2 g Oral Q1200   pantoprazole  40 mg Oral Daily   polyethylene glycol  17 g Oral QODAY   povidone-iodine  2 application Topical Once   pravastatin  40 mg  Oral QHS   ranolazine  500 mg Oral BID   Continuous Infusions:  cefTRIAXone (ROCEPHIN)  IV 1 g (03/27/21 1252)   lactated ringers Stopped (03/25/21 2028)   lactated ringers      Principal Problem:   Closed left hip fracture (HCC) Active Problems:   Anemia due to stage 4 chronic kidney disease (HCC)   Atrial fibrillation (HCC)   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)   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 ([place])   Dyslipidemia   Coronary artery disease   Presence of Watchman left atrial appendage closure device   Pacemaker Medtronic device   Chronic systolic CHF (congestive heart failure) (HCC)   OSA on CPAP   Gastroesophageal reflux   S/P total left hip arthroplasty   LOS: 4 days   A & P  Assessment and Plan: * Closed left hip fracture ([place])- (present on admission) Admit to med/surg bed.  Patient luckily fell on the side that she is planning on getting a hip replacement in May 2023.  Patient is not on any antiplatelets including Plavix or systemic anticoagulants such as Eliquis due to history of recurrent GI bleeding.  Orthopedics has been notified the patient's hip fracture.  Keep her n.p.o. for potential surgery later today.  S/P total left hip arthroplasty- (present on admission) The patient has tolerated the procedure well. Orthopedic surgery has stated that the patient may be discharged with goals with PT have been met. I have diiscussed this with PT.  Gastroesophageal reflux- (present on admission) Continue Pepcid and Protonix.  OSA on CPAP Continue CPAP.  Chronic systolic CHF (congestive heart failure) ([place])- (present on admission) Euvolemic.  Continue Lasix.  Pacemaker Medtronic device- (present on admission) Chronic.  History of complete heart block.  Presence of Watchman left atrial appendage closure device- (present on admission) Patient had watchman placed due to history of GI bleeding while on  anticoagulation.  Coronary artery disease- (present on admission) Is on Ranexa, Imdur.  she is on amiodarone.  she is not taking antiplatelets including aspirin or Plavix due to history of GI bleeding.  She follows regularly with cardiology.  Dyslipidemia- (present on admission) Is on Repatha, Zetia, Lovaza, Pravachol.  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 ([place])- (present on admission) Stable.  She follows with nephrology in [facility].  Benign hypertension with CKD (chronic kidney disease) stage IV ([place])- (present on admission) Continue home medications. Pt blood pressures were quite high overnight. She is only receiving imdur 30 mg daily. Will add hydralazine to improve blood pressures.  Atrial fibrillation ([place])- (present on admission) Has a permanent pacemaker.  she is on amiodarone.  she has a Multimedia programmer.  She is not on anticoagulation due to history of GI bleeding.  Anemia due to stage 4 chronic kidney disease ([place])- (present on admission) Chronic. Hemoglobin has dropped from 10.2 on 03/26/2021 to 9.3 this morning. Will check FOBT and anemia panel.    I have seen and examined this patient myself. I have spent 32 minutes in her evaluation and care.  DVT prophylaxis: SCD's Code Status: Full Code Family Communication: Family was at bedside Disposition Plan: To home with home health PT/OT   Suleima Ohlendorf, DO [facility] Direct contact: see [email or web address]  7PM-7AM contact night coverage as above 03/27/2021, 2:57 PM  LOS: 3 days

## 2021-03-27 NOTE — Progress Notes (Signed)
Subjective: 2 Days Post-Op Procedure(s) (LRB): LEFT POSTERIOR TOTAL HIP  ZIMMER CABLES (Left) Patient reports pain as mild.   Patient seen in rounds for Dr. Alvan Dame. Patient is resting in bed on exam this morning. She reports her hip feels well at rest. She was able to get up with PT yesterday. Her goal is to discharge home with family.  We will continue therapy today.   Objective: Vital signs in last 24 hours: Temp:  [98.2 F (36.8 C)-98.4 F (36.9 C)] 98.2 F (36.8 C) (02/23 0615) Pulse Rate:  [65-72] 71 (02/23 0615) Resp:  [17-18] 18 (02/23 0615) BP: (156-181)/(59-78) 181/76 (02/23 0615) SpO2:  [90 %-94 %] 91 % (02/23 0615)  Intake/Output from previous day:  Intake/Output Summary (Last 24 hours) at 03/27/2021 0905 Last data filed at 03/26/2021 2300 Gross per 24 hour  Intake 324.73 ml  Output 625 ml  Net -300.27 ml     Intake/Output this shift: No intake/output data recorded.  Labs: Recent Labs    03/25/21 0110 03/25/21 2000 03/26/21 0407 03/27/21 0404  HGB 11.3* 11.1* 10.2* 9.3*   Recent Labs    03/26/21 0407 03/27/21 0404  WBC 7.4 9.0  RBC 3.18* 2.84*  HCT 32.2* 28.2*  PLT 124* 145*   Recent Labs    03/26/21 0407 03/27/21 0404  NA 135 137  K 4.3 4.9  CL 105 107  CO2 20* 23  BUN 27* 44*  CREATININE 1.50* 1.72*  GLUCOSE 226* 247*  CALCIUM 8.1* 8.7*   No results for input(s): LABPT, INR in the last 72 hours.  Exam: General - Patient is Alert and Oriented Extremity - Neurologically intact Sensation intact distally Intact pulses distally Dorsiflexion/Plantar flexion intact Dressing - dressing C/D/I Motor Function - intact, moving foot and toes well on exam.   Past Medical History:  Diagnosis Date   Acute ischemic stroke ([place])    Acute metabolic encephalopathy    Acute on chronic systolic (congestive) heart failure (HCC)    Acute renal insufficiency 09/02/2016   Anemia due to stage 4 chronic kidney disease ([place]) 04/04/2015   Atrial  fibrillation (HCC)    Benign hypertension with CKD (chronic kidney disease) stage IV ([place]) 04/04/2015   Bradycardia 09/17/2014   Chest pain 06/23/2016   Overview:  Added automatically from request for surgery [id number]   Chronic anemia    Chronic kidney disease (CKD), stage III (moderate) ([place])    Coronary artery disease 10/12/2016   Non drug-eluting stent implanted in June 2018 to mid RCA   08/08/2016 10:37  Angiographic Findings  Cardiac Arteries and Lesion Findings LMCA: 0% and Normal. LAD: 0% and Normal. RCA:   Lesion on Mid RCA: Mid subsection.95% stenosis reduced to 0%. Pre procedure   TIMI II flow was noted. Post Procedure TIMI III flow was present. Poor run   off was present. The lesion was diagnosed as High Risk ([place]).    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 ([place]) 04/04/2015   Diabetes mellitus without complication ([place])    Diverticulosis    Dyslipidemia 09/17/2014   Gastroesophageal reflux    GI bleed 08/06/2016   Heart block AV complete ([place]) 09/05/2019   History of cardiac monitoring 02/23/2018   monitor inserted   Hyperlipidemia    Hypertensive heart disease with heart failure (HCC)    Iron deficiency anemia 12/28/2017   LV dysfunction 09/02/2016   Myocardial infarction [facility])    NSTEMI (non-ST elevated myocardial infarction) ([place]) 08/06/2016   Obstructive  sleep apnea 09/17/2014   Orthostatic hypotension 12/28/2017   OSA on CPAP    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   Postural dizziness with presyncope 09/23/2017   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/24/2017   Recurrent left pleural effusion 03/14/2015   Renal failure, chronic, stage 3 (moderate) (HCC)    Syncope 97/06/3003   Systolic heart failure ([place])    Thyroid disease    Vitamin D deficiency 04/04/2015    Assessment/Plan: 2 Days Post-Op Procedure(s) (LRB): LEFT POSTERIOR TOTAL HIP  ZIMMER CABLES (Left) Principal Problem:   Closed left hip fracture (HCC) Active Problems:   Anemia  due to stage 4 chronic kidney disease (HCC)   Atrial fibrillation (HCC)   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)   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)   Dyslipidemia   Coronary artery disease   Presence of Watchman left atrial appendage closure device   Pacemaker Medtronic device   Chronic systolic CHF (congestive heart failure) (HCC)   OSA on CPAP   Gastroesophageal reflux   S/P total left hip arthroplasty  Estimated body mass index is 31.89 kg/m as calculated from the following:   Height as of this encounter: 5\' 3"  (1.6 m).   Weight as of this encounter: 81.6 kg. Advance diet Up with therapy D/C IV fluids  DVT Prophylaxis - Aspirin Weight bearing as tolerated.  Hgb stable at 9.3 this AM.  Plan is to go Home after hospital stay. Her goal is for discharge home. From an orthopaedic standpoint, she will be ready for discharge once meeting goals with PT. Discharge per medicine team.   Griffith Citron, PA-C Orthopedic Surgery [phone] 03/27/2021, 9:05 AM

## 2021-03-27 NOTE — Assessment & Plan Note (Addendum)
The patient has tolerated the procedure well. Orthopedic surgery has stated that the patient may be discharged, however, the family now wishes for the patient to go to SNF. TOC is working on it.

## 2021-03-28 DIAGNOSIS — K5903 Drug induced constipation: Secondary | ICD-10-CM

## 2021-03-28 DIAGNOSIS — K5900 Constipation, unspecified: Secondary | ICD-10-CM | POA: Diagnosis not present

## 2021-03-28 HISTORY — DX: Constipation, unspecified: K59.00

## 2021-03-28 LAB — URINE CULTURE: Culture: >=100000 — AB

## 2021-03-28 LAB — CBC WITH DIFFERENTIAL/PLATELET
Abs Immature Granulocytes: 0.07 10*3/uL (ref 0.00–0.07)
Basophils Absolute: 0 10*3/uL (ref 0.0–0.1)
Basophils Relative: 0 %
Eosinophils Absolute: 0.3 10*3/uL (ref 0.0–0.5)
Eosinophils Relative: 4 %
HCT: 27.1 % — ABNORMAL LOW (ref 36.0–46.0)
Hemoglobin: 8.7 g/dL — ABNORMAL LOW (ref 12.0–15.0)
Immature Granulocytes: 1 %
Lymphocytes Relative: 9 %
Lymphs Abs: 0.6 10*3/uL — ABNORMAL LOW (ref 0.7–4.0)
MCH: 32.3 pg (ref 26.0–34.0)
MCHC: 32.1 g/dL (ref 30.0–36.0)
MCV: 100.7 fL — ABNORMAL HIGH (ref 80.0–100.0)
Monocytes Absolute: 0.5 10*3/uL (ref 0.1–1.0)
Monocytes Relative: 7 %
Neutro Abs: 5.4 10*3/uL (ref 1.7–7.7)
Neutrophils Relative %: 79 %
Platelets: 137 10*3/uL — ABNORMAL LOW (ref 150–400)
RBC: 2.69 MIL/uL — ABNORMAL LOW (ref 3.87–5.11)
RDW: 13.1 % (ref 11.5–15.5)
WBC: 6.9 10*3/uL (ref 4.0–10.5)
nRBC: 0.6 % — ABNORMAL HIGH (ref 0.0–0.2)

## 2021-03-28 LAB — GLUCOSE, CAPILLARY
Glucose-Capillary: 214 mg/dL — ABNORMAL HIGH (ref 70–99)
Glucose-Capillary: 213 mg/dL — ABNORMAL HIGH (ref 70–99)
Glucose-Capillary: 283 mg/dL — ABNORMAL HIGH (ref 70–99)
Glucose-Capillary: 293 mg/dL — ABNORMAL HIGH (ref 70–99)

## 2021-03-28 LAB — BASIC METABOLIC PANEL
Anion gap: 7 (ref 5–15)
BUN: 59 mg/dL — ABNORMAL HIGH (ref 8–23)
CO2: 23 mmol/L (ref 22–32)
Calcium: 8.7 mg/dL — ABNORMAL LOW (ref 8.9–10.3)
Chloride: 107 mmol/L (ref 98–111)
Creatinine, Ser: 1.67 mg/dL — ABNORMAL HIGH (ref 0.44–1.00)
GFR, Estimated: 31 mL/min — ABNORMAL LOW (ref >60)
Glucose, Bld: 226 mg/dL — ABNORMAL HIGH (ref 70–99)
Potassium: 5.2 mmol/L — ABNORMAL HIGH (ref 3.5–5.1)
Sodium: 137 mmol/L (ref 135–145)

## 2021-03-28 MED ORDER — HYDRALAZINE HCL 50 MG PO TABS
50.0000 mg | ORAL_TABLET | Freq: Four times a day (QID) | ORAL | Status: DC
  Administered 2021-03-28 – 2021-03-31 (×12): 50 mg via ORAL
  Filled 2021-03-28 (×12): qty 1

## 2021-03-28 MED ORDER — ASPIRIN 81 MG PO CHEW: 81.0000 mg | CHEWABLE_TABLET | Freq: Two times a day (BID) | ORAL | 0 refills | Status: AC

## 2021-03-28 MED ORDER — CLONIDINE HCL 0.1 MG PO TABS
0.1000 mg | ORAL_TABLET | Freq: Once | ORAL | Status: AC
  Administered 2021-03-28: 0.1 mg via ORAL
  Filled 2021-03-28: qty 1

## 2021-03-28 MED ORDER — HYDROCODONE-ACETAMINOPHEN 5-325 MG PO TABS: 1.0000 | ORAL_TABLET | Freq: Four times a day (QID) | ORAL | 0 refills | Status: DC | PRN

## 2021-03-28 MED ORDER — METHOCARBAMOL 500 MG PO TABS: 500.0000 mg | ORAL_TABLET | Freq: Four times a day (QID) | ORAL | 0 refills | Status: DC | PRN

## 2021-03-28 MED ORDER — DOCUSATE SODIUM 100 MG PO CAPS: 100.0000 mg | ORAL_CAPSULE | Freq: Two times a day (BID) | ORAL | 0 refills | Status: DC

## 2021-03-28 NOTE — Plan of Care (Signed)

## 2021-03-28 NOTE — Progress Notes (Addendum)
Subjective: 3 Days Post-Op Procedure(s) (LRB): LEFT POSTERIOR TOTAL HIP  ZIMMER CABLES (Left) Patient reports pain as mild.   Patient seen in rounds for Dr. Alvan Dame. Patient is resting in bed on exam. She reports she had a good day yesterday. She was able to ambulate 120 feet with PT. She is voiding without difficulty, positive flatus. No BM yet, but denies bloating, abdominal pain.  We will continue therapy today.   Objective: Vital signs in last 24 hours: Temp:  [97 F (36.1 C)-98.6 F (37 C)] 97.4 F (36.3 C) (02/24 0533) Pulse Rate:  [69-76] 76 (02/23 2121) Resp:  [17-20] 18 (02/24 0533) BP: (148-181)/(60-68) 155/62 (02/24 0533) SpO2:  [93 %-94 %] 93 % (02/24 0533)  Intake/Output from previous day:  Intake/Output Summary (Last 24 hours) at 03/28/2021 0729 Last data filed at 03/28/2021 0548 Gross per 24 hour  Intake 460 ml  Output 500 ml  Net -40 ml     Intake/Output this shift: No intake/output data recorded.  Labs: Recent Labs    03/25/21 2000 03/26/21 0407 03/27/21 0404 03/28/21 0350  HGB 11.1* 10.2* 9.3* 8.7*   Recent Labs    03/27/21 0404 03/27/21 1535 03/28/21 0350  WBC 9.0  --  6.9  RBC 2.84* 2.81* 2.69*  HCT 28.2*  --  27.1*  PLT 145*  --  137*   Recent Labs    03/27/21 0404 03/28/21 0350  NA 137 137  K 4.9 5.2*  CL 107 107  CO2 23 23  BUN 44* 59*  CREATININE 1.72* 1.67*  GLUCOSE 247* 226*  CALCIUM 8.7* 8.7*   No results for input(s): LABPT, INR in the last 72 hours.  Exam: General - Patient is Alert and Oriented Extremity - Neurologically intact Sensation intact distally Intact pulses distally Dorsiflexion/Plantar flexion intact Dressing - dressing C/D/I Motor Function - intact, moving foot and toes well on exam.   Past Medical History:  Diagnosis Date   Acute ischemic stroke ([place])    Acute metabolic encephalopathy    Acute on chronic systolic (congestive) heart failure (HCC)    Acute renal insufficiency 09/02/2016   Anemia due  to stage 4 chronic kidney disease ([place]) 04/04/2015   Atrial fibrillation (HCC)    Benign hypertension with CKD (chronic kidney disease) stage IV ([place]) 04/04/2015   Bradycardia 09/17/2014   Chest pain 06/23/2016   Overview:  Added automatically from request for surgery [id number]   Chronic anemia    Chronic kidney disease (CKD), stage III (moderate) ([place])    Coronary artery disease 10/12/2016   Non drug-eluting stent implanted in June 2018 to mid RCA   08/08/2016 10:37  Angiographic Findings  Cardiac Arteries and Lesion Findings LMCA: 0% and Normal. LAD: 0% and Normal. RCA:   Lesion on Mid RCA: Mid subsection.95% stenosis reduced to 0%. Pre procedure   TIMI II flow was noted. Post Procedure TIMI III flow was present. Poor run   off was present. The lesion was diagnosed as High Risk (C).    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 ([place]) 04/04/2015   Diabetes mellitus without complication ([place])    Diverticulosis    Dyslipidemia 09/17/2014   Gastroesophageal reflux    GI bleed 08/06/2016   Heart block AV complete ([place]) 09/05/2019   History of cardiac monitoring 02/23/2018   monitor inserted   Hyperlipidemia    Hypertensive heart disease with heart failure (HCC)    Iron deficiency anemia 12/28/2017   LV dysfunction 09/02/2016  Myocardial infarction [facility])    NSTEMI (non-ST elevated myocardial infarction) ([place]) 08/06/2016   Obstructive sleep apnea 09/17/2014   Orthostatic hypotension 12/28/2017   OSA on CPAP    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   Postural dizziness with presyncope 09/23/2017   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/24/2017   Recurrent left pleural effusion 03/14/2015   Renal failure, chronic, stage 3 (moderate) (HCC)    Syncope 21/03/2480   Systolic heart failure ([place])    Thyroid disease    Vitamin D deficiency 04/04/2015    Assessment/Plan: 3 Days Post-Op Procedure(s) (LRB): LEFT POSTERIOR TOTAL HIP  ZIMMER CABLES (Left) Principal Problem:    Closed left hip fracture (HCC) Active Problems:   Anemia due to stage 4 chronic kidney disease (HCC)   Atrial fibrillation (HCC)   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)   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   Weight as of this encounter: 81.6 kg. Advance diet Up with therapy  DVT Prophylaxis - Aspirin 81 BID x4 weeks Weight bearing as tolerated.  ABLA: Hgb 8.7 this AM.   Plan is to go Home after hospital stay. From an orthopaedic standpoint, she is appropriate for discharge home at this point. She will follow up in our office in 2 weeks. Dressing is waterproof, and will remain in place until follow up.   Urine culture grew enterobacter, and she is being treated for this currently. Discharge per medical team.   Griffith Citron, PA-C Orthopedic Surgery [phone] 03/28/2021, 7:29 AM

## 2021-03-28 NOTE — Progress Notes (Signed)
Notified on call provider about patient's elevated BP, which was checked after giving scheduled oral hydralazine. Prior to giving patient the hydralazine, the patient's BP was 181/68. After giving the hydralazine, the patient's BP was 175/68. On call provider gave a new order for a one time dose of po clonidine 0.1 mg tab. Rechecked patient's BP after the clonidine administration and the BP was 152/61.

## 2021-03-28 NOTE — Progress Notes (Signed)
PROGRESS NOTE  Amanda [name]:[id number] DOB: April 24, 1940 DOA: 03/23/2021 PCP: Venetia Maxon, [place], MD  Brief History   Amanda [name] is a 81 y.o. female with PMH significant for DM2, HTN, HLD, CAD/stent, chronic diastolic CHF, complete heart block s/p ppm, A-fib s/p Watchman device, stroke, CKD, h/o GI bleed, chronic anemia, GERD, diverticulosis, obstructive sleep apnea, hypothyroidism, vitamin D deficiency, osteoarthritis of hip. Patient was brought to the ED on 03/23/2021 after a fall at home from losing her balance.  She landed on her left hip and started having severe pain.   In the ED, x-ray of left hip showed intertrochanteric fracture. Lab with BUN/creatinine 38/2.6, glucose 234, hemoglobin 13.3 EDP admitted to hospitalist service Orthopedics consulted The patient underwent ORIF on 03/25/2021. She has tolerated the procedure well.  She has worked with PT this morning. She has done well with them, but has not met necessary goals for safety on discharge to home. Orthopedic surgery has cleared her for discharge to home when these goals are met. I have discussed this with PT.  The patient has not had a BM for 3 days. She will be discharged to home after she has a BM.  Plan is for discharge to home with home health PT.  Consultants  Orthopedic surgery  Procedures  ORIF left hip  Antibiotics   Anti-infectives (From admission, onward)    Start     Dose/Rate Route Frequency Ordered Stop   03/27/21 1030  cefTRIAXone (ROCEPHIN) 1 g in sodium chloride 0.9 % 100 mL IVPB        1 g 200 mL/hr over 30 Minutes Intravenous Every 24 hours 03/27/21 0936     03/25/21 2000  ceFAZolin (ANCEF) IVPB 2g/100 mL premix        2 g 200 mL/hr over 30 Minutes Intravenous Every 6 hours 03/25/21 1818 03/26/21 0305   03/25/21 1945  ceFAZolin (ANCEF) IVPB 2g/100 mL premix        2 g 200 mL/hr over 30 Minutes Intravenous On call to O.R. 03/25/21 1933 03/26/21 0559   03/25/21 1300  ceFAZolin  (ANCEF) IVPB 2g/100 mL premix        2 g 200 mL/hr over 30 Minutes Intravenous On call to O.R. 03/24/21 1835 03/25/21 1433      Subjective  The patient is resting comfortably. No new complaints.  Objective   Vitals:  Vitals:   03/28/21 0800 03/28/21 1531  BP:  (!) 163/62  Pulse:  74  Resp: 18 20  Temp:  98.5 F (36.9 C)  SpO2:  92%    Exam:  Constitutional:  The patient is awake, alert, and oriented x 3. No acute distress. Respiratory:  No increased work of breathing. No wheezes, rales, or rhonchi No tactile fremitus Cardiovascular:  Regular rate and rhythm No murmurs, ectopy, or gallups. No lateral PMI. No thrills. Abdomen:  Abdomen is soft, non-tender, non-distended No hernias, masses, or organomegaly Normoactive bowel sounds.  Musculoskeletal:  No cyanosis, clubbing, or edema Skin:  No rashes, lesions, ulcers palpation of skin: no induration or nodules Neurologic:  CN 2-12 intact Sensation all 4 extremities intact Psychiatric:  Mental status Mood, affect appropriate Orientation to person, place, time  judgment and insight appear intact   I have personally reviewed the following:   Today's Data  Vitals  Lab Data  BMP CBC  Micro Data  Urine culture - no growth  Imaging  X-ray of the pelvis  Cardiology Data  EKG  Other Data  Scheduled Meds:  acidophilus  1 capsule Oral Q1200   amiodarone  200 mg Oral Daily   aspirin  81 mg Oral BID   docusate sodium  100 mg Oral BID   ezetimibe  10 mg Oral QPM   famotidine  10 mg Oral Daily   febuxostat  40 mg Oral QHS   feeding supplement  237 mL Oral BID BM   ferrous sulfate  325 mg Oral TID PC   hydrALAZINE  25 mg Oral Q6H   insulin aspart  0-5 Units Subcutaneous QHS   insulin aspart  0-9 Units Subcutaneous TID WC   isosorbide mononitrate  30 mg Oral QHS   levothyroxine  50 mcg Oral QAC breakfast   omega-3 acid ethyl esters  2 g Oral Q1200   pantoprazole  40 mg Oral Daily   polyethylene  glycol  17 g Oral QODAY   povidone-iodine  2 application Topical Once   pravastatin  40 mg Oral QHS   ranolazine  500 mg Oral BID   Continuous Infusions:  cefTRIAXone (ROCEPHIN)  IV 1 g (03/28/21 1022)   lactated ringers Stopped (03/25/21 2028)   lactated ringers      Principal Problem:   Closed left hip fracture (HCC) Active Problems:   Anemia due to stage 4 chronic kidney disease (HCC)   Atrial fibrillation (HCC)   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)   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  Patient luckily fell on the side that she is planning on getting a hip replacement in May 2023.  Patient is not on any antiplatelets including Plavix or systemic anticoagulants such as Eliquis due to history of recurrent GI bleeding.  The patient underwent ORIF of hip on 03/24/2021. She has been doing well with PT. She is cleared for discharge to home with home health PT when she has a BM.  Constipation The patient has not had a BM for 3 days. She is receiving docusate sodium and miralax. She cannot be discharged until she has a BM.  S/P total left hip arthroplasty- (present on admission) The patient has tolerated the procedure well. Orthopedic surgery has stated that the patient may be discharged, however, she has not had a BM in 3 days. She will be discharged when she has a BM.  Gastroesophageal reflux- (present on admission) Continue Pepcid and Protonix.  OSA on CPAP Continue CPAP.  Chronic systolic CHF (congestive heart  failure) ([place])- (present on admission) Euvolemic.  Continue Lasix.  Pacemaker Medtronic device- (present on admission) Chronic.  History of complete heart block.  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  Coronary artery disease- (present on admission) Is on Ranexa, Imdur.  she is on amiodarone.  she is not taking antiplatelets including aspirin or Plavix due to history of GI bleeding.  She follows regularly with cardiology.  Dyslipidemia- (present on admission) Is on Repatha, Zetia, Lovaza,  Pravachol.  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  She follows with nephrology in [facility].  Benign hypertension with CKD (chronic kidney disease) stage IV ([place])- (present on admission) Continue home medications. Pt blood pressures were quite high overnight. She is receiving imdur 30 mg daily and hydralazine 25 mg every six hours.daily. Will increase hydralazine to 50 mg to improve blood pressures.  Atrial fibrillation ([place])- (present on admission) Has a permanent pacemaker.  she is on amiodarone.  She has a Multimedia programmer.  She is not on anticoagulation due to history of GI bleeding.  Anemia due to stage 4 chronic kidney disease ([place])- (present on admission) Chronic. Hemoglobin has dropped from 10.2 on 03/26/2021 to 8.7 this morning. Will check FOBT and anemia panel.    I have seen and examined this patient myself. I have spent 32 minutes in her evaluation and care.  DVT prophylaxis: SCD's Code Status: Full Code Family Communication: Family was at bedside Disposition Plan: To home with home health PT/OT   Trevar Boehringer, DO [facility] Direct contact: see [email or web address]  7PM-7AM contact night coverage as above 03/28/2021, 4:55 PM  LOS: 3 days

## 2021-03-28 NOTE — Assessment & Plan Note (Addendum)
Resolved.  Continue to monitor.

## 2021-03-28 NOTE — Progress Notes (Signed)
Physical Therapy Treatment Patient Details Name: Amanda [name] MRN: [id number] DOB: Apr 22, 1940 Today's Date: 03/28/2021   History of Present Illness Amanda [name] is an 81 y.o. female s/p L THA posterior hip due to non displaced L intertrochanteric hip fracture in setting of advanced L hip OA and fall at home. PMH: A-fib, complete heart block s/p permanent pacemaker, Watchman device, CKD stage IV, anemia CKD, type 2 diabetes, coronary artery disease, CHF, OSA on CPAP, left hip OA, HTN, MI, NSTEMI    PT Comments    Pt progressing well with activity tolerance, is limited by antalgic gait with further distances so PT discussed importance of frequent rest breaks when ambulating. PT encouraged up and walking 5x/day at minimum to maintain strength and promote circulation, with supervision of daughter, pt and daughter in agreement. Pt appropriate to d/c from acute setting from PT perspective with assist as needed from daughter.     Recommendations for follow up therapy are one component of a multi-disciplinary discharge planning process, led by the attending physician.  Recommendations may be updated based on patient status, additional functional criteria and insurance authorization.  Follow Up Recommendations  Home health PT     Assistance Recommended at Discharge Frequent or constant Supervision/Assistance  Patient can return home with the following A little help with walking and/or transfers;A little help with bathing/dressing/bathroom;Assistance with cooking/housework;Assist for transportation;Help with stairs or ramp for entrance   Equipment Recommendations  None recommended by PT    Recommendations for Other Services       Precautions / Restrictions Precautions Precautions: Posterior Hip;Fall Restrictions Weight Bearing Restrictions: No LLE Weight Bearing: Weight bearing as tolerated     Mobility  Bed Mobility Overal bed mobility: Needs Assistance Bed Mobility: Sit to  Supine       Sit to supine: Min assist, HOB elevated   General bed mobility comments: assist for LE lifting into bed given pain, pt's daughter at bedside and can assist with this    Transfers Overall transfer level: Needs assistance Equipment used: Rolling walker (2 wheels) Transfers: Sit to/from Stand Sit to Stand: Min guard           General transfer comment: close guard for safety, cues for technique and avoiding anterior trunk flexion over LLE given posterior hip precautions.    Ambulation/Gait Ambulation/Gait assistance: Min guard Gait Distance (Feet): 125 Feet Assistive device: Rolling walker (2 wheels) Gait Pattern/deviations: Decreased stride length, Decreased weight shift to left, Step-through pattern, Antalgic Gait velocity: decr     General Gait Details: Cues for upright posture, placement in RW, turning away from LLE to prevent closed chain hip IR. Increasingly antalgic with further gait distance.   Stairs         General stair comments: pt able to teach back technique, "up with the good leg, down with the bad leg". PT encouraged use of chair at base of steps for energy conservation, as pt's daughter states it is up to 50 paces from car to steps and pt may need rest break prior to initiating stairs.   Wheelchair Mobility    Modified Rankin (Stroke Patients Only)       Balance Overall balance assessment: Needs assistance Sitting-balance support: Feet supported Sitting balance-Leahy Scale: Fair     Standing balance support: During functional activity, Bilateral upper extremity supported, Reliant on assistive device for balance Standing balance-Leahy Scale: Poor  Cognition Arousal/Alertness: Awake/alert Behavior During Therapy: WFL for tasks assessed/performed Overall Cognitive Status: Within Functional Limits for tasks assessed                                 General Comments: good recall  of posterior hip precautions, recalls stair navigation technique well        Exercises      General Comments        Pertinent Vitals/Pain Pain Assessment Pain Assessment: 0-10 Pain Score: 6  Pain Location: L hip Pain Descriptors / Indicators: Sore, Discomfort, Grimacing Pain Intervention(s): Limited activity within patient's tolerance, Monitored during session, Repositioned, Premedicated before session    Home Living                          Prior Function            PT Goals (current goals can now be found in the care plan section) Acute Rehab PT Goals Patient Stated Goal: home with family to assist PT Goal Formulation: With patient/family Time For Goal Achievement: 04/09/21 Potential to Achieve Goals: Good Progress towards PT goals: Progressing toward goals    Frequency    Min 5X/week      PT Plan Current plan remains appropriate    Co-evaluation              AM-PAC PT "6 Clicks" Mobility   Outcome Measure  Help needed turning from your back to your side while in a flat bed without using bedrails?: A Little Help needed moving from lying on your back to sitting on the side of a flat bed without using bedrails?: A Little Help needed moving to and from a bed to a chair (including a wheelchair)?: A Little Help needed standing up from a chair using your arms (e.g., wheelchair or bedside chair)?: A Little Help needed to walk in hospital room?: A Little Help needed climbing 3-5 steps with a railing? : A Little 6 Click Score: 18    End of Session Equipment Utilized During Treatment: Gait belt Activity Tolerance: Patient tolerated treatment well Patient left: with call bell/phone within reach;with family/visitor present;in bed;with bed alarm set Nurse Communication: Mobility status PT Visit Diagnosis: Unsteadiness on feet (R26.81);Other abnormalities of gait and mobility (R26.89);Muscle weakness (generalized) (M62.81);Pain Pain - Right/Left:  Left Pain - part of body: Hip     Time: 2595-6387 PT Time Calculation (min) (ACUTE ONLY): 24 min  Charges:  $Gait Training: 8-22 mins $Therapeutic Activity: 8-22 mins                     Stacie Glaze, PT DPT Acute Rehabilitation Services Pager [phone]  Office [phone]   [place] 03/28/2021, 11:44 AM

## 2021-03-29 LAB — GLUCOSE, CAPILLARY
Glucose-Capillary: 190 mg/dL — ABNORMAL HIGH (ref 70–99)
Glucose-Capillary: 303 mg/dL — ABNORMAL HIGH (ref 70–99)
Glucose-Capillary: 197 mg/dL — ABNORMAL HIGH (ref 70–99)
Glucose-Capillary: 172 mg/dL — ABNORMAL HIGH (ref 70–99)

## 2021-03-29 MED ORDER — METOPROLOL TARTRATE 25 MG PO TABS
12.5000 mg | ORAL_TABLET | Freq: Two times a day (BID) | ORAL | Status: DC
  Administered 2021-03-29 – 2021-03-31 (×4): 12.5 mg via ORAL
  Filled 2021-03-29 (×4): qty 1

## 2021-03-29 MED ORDER — DIPHENHYDRAMINE HCL 50 MG/ML IJ SOLN
12.5000 mg | Freq: Four times a day (QID) | INTRAMUSCULAR | Status: DC | PRN
  Administered 2021-03-29 (×2): 12.5 mg via INTRAVENOUS
  Filled 2021-03-29 (×2): qty 1

## 2021-03-29 MED ORDER — DOCUSATE SODIUM 100 MG PO CAPS: 200.0000 mg | ORAL_CAPSULE | Freq: Once | ORAL | Status: DC

## 2021-03-29 NOTE — Progress Notes (Signed)
PROGRESS NOTE  CHANDELLE HARKEY DVV:[id number] DOB: 01/15/1941 DOA: 03/23/2021 PCP: Venetia Maxon, Sharon Mt, MD  Brief History   JACQULINE TERRILL is a 81 y.o. female with PMH significant for DM2, HTN, HLD, CAD/stent, chronic diastolic CHF, complete heart block s/p ppm, A-fib s/p Watchman device, stroke, CKD, h/o GI bleed, chronic anemia, GERD, diverticulosis, obstructive sleep apnea, hypothyroidism, vitamin D deficiency, osteoarthritis of hip. Patient was brought to the ED on 03/23/2021 after a fall at home from losing her balance.  She landed on her left hip and started having severe pain.   In the ED, x-ray of left hip showed intertrochanteric fracture. Lab with BUN/creatinine 38/2.6, glucose 234, hemoglobin 13.3 EDP admitted to hospitalist service Orthopedics consulted The patient underwent ORIF on 03/25/2021. She has tolerated the procedure well.  She has worked with PT this morning. She has done well with them, but has not met necessary goals for safety on discharge to home. Orthopedic surgery has cleared her for discharge to home when these goals are met. I have discussed this with PT.  The patient has not had a BM for 3 days. She will be discharged to home after she has a BM.  The patient's family has decided that they want the patient to go to SNF. TOC is working on it.  Consultants  Orthopedic surgery  Procedures  ORIF left hip  Antibiotics   Anti-infectives (From admission, onward)    Start     Dose/Rate Route Frequency Ordered Stop   03/27/21 1030  cefTRIAXone (ROCEPHIN) 1 g in sodium chloride 0.9 % 100 mL IVPB        1 g 200 mL/hr over 30 Minutes Intravenous Every 24 hours 03/27/21 0936     03/25/21 2000  ceFAZolin (ANCEF) IVPB 2g/100 mL premix        2 g 200 mL/hr over 30 Minutes Intravenous Every 6 hours 03/25/21 1818 03/26/21 0305   03/25/21 1945  ceFAZolin (ANCEF) IVPB 2g/100 mL premix        2 g 200 mL/hr over 30 Minutes Intravenous On call to O.R. 03/25/21 1933  03/26/21 0559   03/25/21 1300  ceFAZolin (ANCEF) IVPB 2g/100 mL premix        2 g 200 mL/hr over 30 Minutes Intravenous On call to O.R. 03/24/21 1835 03/25/21 1433      Subjective  The patient is resting comfortably. No new complaints.  Objective   Vitals:  Vitals:   03/29/21 0300 03/29/21 0800  BP: (!) 174/62   Pulse: 75   Resp: 18 15  Temp: 97.9 F (36.6 C)   SpO2: 90%     Exam:  Constitutional:  The patient is awake, alert, and oriented x 3. No acute distress. Respiratory:  No increased work of breathing. No wheezes, rales, or rhonchi No tactile fremitus Cardiovascular:  Regular rate and rhythm No murmurs, ectopy, or gallups. No lateral PMI. No thrills. Abdomen:  Abdomen is soft, non-tender, non-distended No hernias, masses, or organomegaly Normoactive bowel sounds.  Musculoskeletal:  No cyanosis, clubbing, or edema Skin:  No rashes, lesions, ulcers palpation of skin: no induration or nodules Neurologic:  CN 2-12 intact Sensation all 4 extremities intact Psychiatric:  Mental status Mood, affect appropriate Orientation to person, place, time  judgment and insight appear intact   I have personally reviewed the following:   Today's Data  Vitals  Lab Data    Micro Data  Urine culture - no growth  Imaging  X-ray of the pelvis  Cardiology  Data  EKG  Other Data    Scheduled Meds:  acidophilus  1 capsule Oral Q1200   amiodarone  200 mg Oral Daily   aspirin  81 mg Oral BID   docusate sodium  100 mg Oral BID   docusate sodium  200 mg Oral Once   ezetimibe  10 mg Oral QPM   famotidine  10 mg Oral Daily   febuxostat  40 mg Oral QHS   feeding supplement  237 mL Oral BID BM   hydrALAZINE  50 mg Oral Q6H   insulin aspart  0-5 Units Subcutaneous QHS   insulin aspart  0-9 Units Subcutaneous TID WC   isosorbide mononitrate  30 mg Oral QHS   levothyroxine  50 mcg Oral QAC breakfast   metoprolol tartrate  12.5 mg Oral BID   omega-3 acid ethyl  esters  2 g Oral Q1200   pantoprazole  40 mg Oral Daily   polyethylene glycol  17 g Oral QODAY   povidone-iodine  2 application Topical Once   pravastatin  40 mg Oral QHS   ranolazine  500 mg Oral BID   Continuous Infusions:  cefTRIAXone (ROCEPHIN)  IV 1 g (03/29/21 1032)   lactated ringers Stopped (03/25/21 2028)   lactated ringers      Principal Problem:   Closed left hip fracture (HCC) Active Problems:   Anemia due to stage 4 chronic kidney disease (HCC)   Atrial fibrillation (HCC)   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)   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  Patient luckily fell on the side that she is planning on getting a hip replacement in May 2023.  Patient is not on any antiplatelets including Plavix or systemic anticoagulants such as Eliquis due to history of recurrent GI bleeding.  The patient underwent ORIF of hip on 03/24/2021. She has been doing well with PT. The patient has had BM's in the last 24 hours. One smaller and one larger. She would be discharged to home today with home health. However, today the family has expressed a wish for the patient to go to SNF. I have relayed this wish to [facility] and they are working on it.  Constipation The patient has had 2 BM's in the last 24 hours. One smaller and one larger. Will give another 200 mg Docusate as she had not had a BM for 3 days.  S/P total left hip arthroplasty- (present on admission) The patient has tolerated the procedure well. Orthopedic surgery has  stated that the patient may be discharged, however, the family now wishes for the patient to go to SNF. TOC is working on it.  Gastroesophageal reflux- (present on admission) Continue Pepcid and Protonix.  OSA on CPAP Continue CPAP.  Chronic systolic CHF (congestive heart failure) ([place])- (present on admission) Euvolemic.  Continue Lasix.  Pacemaker Medtronic device- (present on admission) Chronic.  History of complete heart block.  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  Coronary artery disease- (present on admission) Is on Ranexa, Imdur.  she is on amiodarone.  she is not taking antiplatelets including aspirin or Plavix due to history of GI bleeding.  She follows regularly with cardiology.  Dyslipidemia- (present on admission) Is on Repatha, Zetia, Lovaza, Pravachol.  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  She follows with nephrology in [facility].  Benign hypertension with CKD (chronic kidney disease) stage IV ([place])- (present on admission) Continue home medications. Pt blood pressures were quite high overnight. She is receiving imdur 30 mg daily and hydralazine 50 mg every six hours.daily. I have added metoprolol 12.5 mg bid.   Atrial fibrillation ([place])- (present on admission) Has a permanent pacemaker.  she is on amiodarone.  She has a Multimedia programmer.  She is not on anticoagulation due to history of GI bleeding.  Anemia due to stage 4 chronic kidney disease ([place])- (present on admission) Chronic. Hemoglobin has dropped from 10.2 on 03/26/2021 to 8.7 this morning. Will check FOBT and anemia panel.    I have seen and examined this patient myself. I have spent 32 minutes in her evaluation and care.  DVT prophylaxis: SCD's Code Status: Full Code Family Communication: Family was at  bedside Disposition Plan: To home with home health PT/OT   Rebeckah Masih, DO [facility] Direct contact: see [email or web address]  7PM-7AM contact night coverage as above 03/29/2021, 4:02 PM  LOS: 3 days

## 2021-03-29 NOTE — NC FL2 (Signed)
[place] LEVEL OF CARE SCREENING TOOL     IDENTIFICATION  Patient Name: Amanda [name] Birthdate: 11/08/1940 Sex: female Admission Date (Current Location): 03/23/2021  [facility] and [place] Number:  Herbalist and Address:  [facility],  [place], [place]      Provider Number: [id number]  Attending Physician Name and Address:  Karie Kirks, DO  Relative Name and Phone Number:  Janelie, Goltz [phone]  [phone]  Ruffner,ginny Daughter   [phone]  Nyajah, Hyson Daughter   [phone]    Current Level of Care: Hospital Recommended Level of Care: [place] Prior Approval Number:    Date Approved/Denied:   PASRR Number: [id number]  Discharge Plan: SNF    Current Diagnoses: Patient Active Problem List   Diagnosis Date Noted   Constipation 03/28/2021   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   Closed left hip fracture ([place]) 03/23/2021   Mitral regurgitation severe based on TEE from 2022 April 06/10/2020   Thyroid disease    Chronic systolic CHF (congestive heart failure) (HCC)    OSA on CPAP    Gastroesophageal reflux    Diverticulosis    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   Heart block AV complete ([place]) 09/05/2019   History of cardiac monitoring 02/23/2018   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/24/2017   Coronary artery disease 10/12/2016   Abnormal nuclear stress test 06/23/2016   Anemia due to stage 4 chronic kidney disease ([place]) 04/04/2015   Benign hypertension with CKD (chronic kidney disease) stage IV ([place]) 04/04/2015   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 ([place]) 04/04/2015   Vitamin D deficiency 04/04/2015   Atrial fibrillation ([place]) 09/17/2014   Dyslipidemia 09/17/2014    Orientation RESPIRATION BLADDER Height & Weight     Self, Time, Situation, Place  Normal Continent Weight: 180 lb (81.6 kg) Height:  5\' 3"  (160  cm)  BEHAVIORAL SYMPTOMS/MOOD NEUROLOGICAL BOWEL NUTRITION STATUS      Continent Diet (Carb Modified)  AMBULATORY STATUS COMMUNICATION OF NEEDS Skin   Limited Assist Verbally Surgical wounds                       Personal Care Assistance Level of Assistance  Bathing, Feeding, Dressing Bathing Assistance: Limited assistance Feeding assistance: Independent Dressing Assistance: Limited assistance     Functional Limitations Info  Sight, Speech, Hearing Sight Info: Adequate Hearing Info: Adequate Speech Info: Adequate    SPECIAL CARE FACTORS FREQUENCY  PT (By licensed PT), OT (By licensed OT)     PT Frequency: Minimum 5x a week OT Frequency: Minimum 5x a week            Contractures Contractures Info: Not present    Additional Factors Info  Code Status, Allergies, Insulin Sliding Scale Code Status Info: Full Code Allergies Info: Ciprofloxacin   Contrast Media (Iodinated Contrast Media)   Nsaids   Statins   Insulin Sliding Scale Info: insulin aspart (novoLOG) injection 0-9 Units 3x a day with meals.       Current Medications (03/29/2021):  This is the current hospital active medication list Current Facility-Administered Medications  Medication Dose Route Frequency Provider Last Rate Last Admin   acetaminophen (TYLENOL) tablet 325-650 mg  325-650 mg Oral Q6H PRN Irving Copas, PA-C       acidophilus (RISAQUAD) capsule 1 capsule  1 capsule Oral Q1200 Kristopher Oppenheim, DO   1 capsule at 03/29/21  1156   amiodarone (PACERONE) tablet 200 mg  200 mg Oral Daily Kristopher Oppenheim, DO   200 mg at 03/29/21 2353   aspirin chewable tablet 81 mg  81 mg Oral BID Irving Copas, PA-C   81 mg at 03/29/21 6144   bisacodyl (DULCOLAX) EC tablet 5 mg  5 mg Oral Daily PRN Irving Copas, PA-C       bisacodyl (DULCOLAX) suppository 10 mg  10 mg Rectal Daily PRN Irving Copas, PA-C   10 mg at 03/28/21 2220   cefTRIAXone (ROCEPHIN) 1 g in sodium chloride 0.9 % 100 mL IVPB  1 g Intravenous  Q24H Swayze, Ava, DO 200 mL/hr at 03/29/21 1032 1 g at 03/29/21 1032   diphenhydrAMINE (BENADRYL) injection 12.5 mg  12.5 mg Intravenous Q6H PRN Swayze, Ava, DO   12.5 mg at 03/29/21 1012   docusate sodium (COLACE) capsule 100 mg  100 mg Oral BID Irving Copas, PA-C   100 mg at 03/29/21 3154   docusate sodium (COLACE) capsule 200 mg  200 mg Oral Once Swayze, Ava, DO       ezetimibe (ZETIA) tablet 10 mg  10 mg Oral QPM Costella Hatcher R, PA-C   10 mg at 03/29/21 1723   famotidine (PEPCID) tablet 10 mg  10 mg Oral Daily Irving Copas, PA-C   10 mg at 03/29/21 0086   febuxostat (ULORIC) tablet 40 mg  40 mg Oral QHS Irving Copas, PA-C   40 mg at 03/28/21 2107   feeding supplement (ENSURE SURGERY) liquid 237 mL  237 mL Oral BID BM Dahal, Marlowe Aschoff, MD   237 mL at 03/29/21 0930   hydrALAZINE (APRESOLINE) tablet 50 mg  50 mg Oral Q6H Swayze, Ava, DO   50 mg at 03/29/21 1723   HYDROcodone-acetaminophen (NORCO) 7.5-325 MG per tablet 1-2 tablet  1-2 tablet Oral Q4H PRN Irving Copas, PA-C   1 tablet at 03/29/21 1723   HYDROcodone-acetaminophen (NORCO/VICODIN) 5-325 MG per tablet 1-2 tablet  1-2 tablet Oral Q4H PRN Irving Copas, PA-C   1 tablet at 03/28/21 1014   insulin aspart (novoLOG) injection 0-5 Units  0-5 Units Subcutaneous QHS Irving Copas, PA-C   3 Units at 03/28/21 2107   insulin aspart (novoLOG) injection 0-9 Units  0-9 Units Subcutaneous TID WC Irving Copas, PA-C   2 Units at 03/29/21 1800   isosorbide mononitrate (IMDUR) 24 hr tablet 30 mg  30 mg Oral QHS Irving Copas, PA-C   30 mg at 03/28/21 2100   lactated ringers infusion   Intravenous Continuous Kristopher Oppenheim, DO   Stopping Infusion hung by another clincian at 03/25/21 2028   lactated ringers infusion   Intravenous Continuous Roda Shutters, Sean D, PA-C       levothyroxine (SYNTHROID) tablet 50 mcg  50 mcg Oral QAC breakfast Irving Copas, PA-C   50 mcg at 03/29/21 0500   menthol-cetylpyridinium (CEPACOL)  lozenge 3 mg  1 lozenge Oral PRN Irving Copas, PA-C       Or   phenol (CHLORASEPTIC) mouth spray 1 spray  1 spray Mouth/Throat PRN Irving Copas, PA-C       methocarbamol (ROBAXIN) tablet 500 mg  500 mg Oral Q6H PRN Costella Hatcher R, PA-C   500 mg at 03/28/21 1158   metoCLOPramide (REGLAN) tablet 5-10 mg  5-10 mg Oral Q8H PRN Irving Copas, PA-C       Or   metoCLOPramide (REGLAN) injection 5-10  mg  5-10 mg Intravenous Q8H PRN Irving Copas, PA-C       metoprolol tartrate (LOPRESSOR) tablet 12.5 mg  12.5 mg Oral BID Swayze, Ava, DO       morphine (PF) 2 MG/ML injection 0.5-1 mg  0.5-1 mg Intravenous Q2H PRN Irving Copas, PA-C       omega-3 acid ethyl esters (LOVAZA) capsule 2 g  2 g Oral Q1200 Irving Copas, PA-C   2 g at 03/29/21 1156   ondansetron (ZOFRAN) tablet 4 mg  4 mg Oral Q6H PRN Irving Copas, PA-C       Or   ondansetron [facility]) injection 4 mg  4 mg Intravenous Q6H PRN Irving Copas, PA-C       pantoprazole (PROTONIX) EC tablet 40 mg  40 mg Oral Daily Irving Copas, PA-C   40 mg at 03/29/21 0930   polyethylene glycol (MIRALAX / GLYCOLAX) packet 17 g  17 g Oral Diamond Nickel R, PA-C   17 g at 03/28/21 1013   polyethylene glycol (MIRALAX / GLYCOLAX) packet 17 g  17 g Oral Daily PRN Irving Copas, PA-C   17 g at 03/29/21 5361   povidone-iodine 10 % swab 2 application  2 application Topical Once Roda Shutters, Sean D, PA-C       pravastatin (PRAVACHOL) tablet 40 mg  40 mg Oral QHS Irving Copas, PA-C   40 mg at 03/28/21 2100   ranolazine (RANEXA) 12 hr tablet 500 mg  500 mg Oral BID Irving Copas, PA-C   500 mg at 03/29/21 0930     Discharge Medications: Please see discharge summary for a list of discharge medications.  Relevant Imaging Results:  Relevant Lab Results:   Additional Information SSN [id number]  Ross Ludwig, LCSW

## 2021-03-29 NOTE — TOC Progression Note (Addendum)
Transition of Care [facility]) - Progression Note    Patient Details  Name: Amanda [name] MRN: [id number] Date of Birth: 1940-09-11  Transition of [facility]) CM/SW Contact  Ross Ludwig, [place] Phone Number: 03/29/2021, 2:09 PM  Clinical Narrative:     CSW spoke to patient's daughters to discuss SNF placement verse home health.  CSW informed them that because PT recommended HH, and she walked 125 feet, chances are low that she will be approved for SNF placement.  CSW explained the process and steps that this CSW will have to complete to begin bed search.  CSW was given permission to begin bed search in [employer]  Per patient's daughter, they would prefer [facility] if possible.  Daughter is also requesting a hospital bed rolling walker, and bedside commode.   Expected Discharge Plan: [place] Barriers to Discharge: No Barriers Identified  Expected Discharge Plan and Services Expected Discharge Plan: [place] Choice: [place] arrangements for the past 2 months: Single Family Home                           HH Arranged: RN, PT [facility] Agency: [place] Date [place]: 03/27/21 Time [place]: 1330 Representative spoke with at [place]: Tommi Rumps, RN   Social Determinants of Health ([place]) Interventions    Readmission Risk Interventions No flowsheet data found.

## 2021-03-29 NOTE — Plan of Care (Signed)
  Problem: Clinical Measurements: Goal: Will remain free from infection Outcome: Progressing   Problem: Clinical Measurements: Goal: Diagnostic test results will improve Outcome: Progressing   Problem: Clinical Measurements: Goal: Respiratory complications will improve Outcome: Progressing   

## 2021-03-30 ENCOUNTER — Inpatient Hospital Stay (HOSPITAL_COMMUNITY): Payer: Medicare Other

## 2021-03-30 LAB — GLUCOSE, CAPILLARY
Glucose-Capillary: 167 mg/dL — ABNORMAL HIGH (ref 70–99)
Glucose-Capillary: 182 mg/dL — ABNORMAL HIGH (ref 70–99)
Glucose-Capillary: 208 mg/dL — ABNORMAL HIGH (ref 70–99)
Glucose-Capillary: 184 mg/dL — ABNORMAL HIGH (ref 70–99)

## 2021-03-30 NOTE — Progress Notes (Signed)
PROGRESS NOTE  CARMA DWIGGINS FFM:[id number] DOB: Jul 21, 1940 DOA: 03/23/2021 PCP: Casper Harrison, Stephanie Coup, MD  Brief History   Amanda [name] is a 81 y.o. female with PMH significant for DM2, HTN, HLD, CAD/stent, chronic diastolic CHF, complete heart block s/p ppm, A-fib s/p Watchman device, stroke, CKD, h/o GI bleed, chronic anemia, GERD, diverticulosis, obstructive sleep apnea, hypothyroidism, vitamin D deficiency, osteoarthritis of hip. Patient was brought to the ED on 03/23/2021 after a fall at home from losing her balance.  She landed on her left hip and started having severe pain.   In the ED, x-ray of left hip showed intertrochanteric fracture. Lab with BUN/creatinine 38/2.6, glucose 234, hemoglobin 13.3 EDP admitted to hospitalist service Orthopedics consulted The patient underwent ORIF on 03/25/2021. She has tolerated the procedure well.  She has worked with PT this morning. She has done well with them, but has not met necessary goals for safety on discharge to home. Orthopedic surgery has cleared her for discharge to home when these goals are met. I have discussed this with PT.  The patient has not had a BM for 3 days. She will be discharged to home after she has a BM.  The patient's family has decided that they want the patient to go to SNF. TOC is working on it.  Consultants  Orthopedic surgery  Procedures  ORIF left hip  Antibiotics   Anti-infectives (From admission, onward)    Start     Dose/Rate Route Frequency Ordered Stop   03/27/21 1030  cefTRIAXone (ROCEPHIN) 1 g in sodium chloride 0.9 % 100 mL IVPB        1 g 200 mL/hr over 30 Minutes Intravenous Every 24 hours 03/27/21 0936     03/25/21 2000  ceFAZolin (ANCEF) IVPB 2g/100 mL premix        2 g 200 mL/hr over 30 Minutes Intravenous Every 6 hours 03/25/21 1818 03/26/21 0305   03/25/21 1945  ceFAZolin (ANCEF) IVPB 2g/100 mL premix        2 g 200 mL/hr over 30 Minutes Intravenous On call to O.R. 03/25/21 1933  03/26/21 0559   03/25/21 1300  ceFAZolin (ANCEF) IVPB 2g/100 mL premix        2 g 200 mL/hr over 30 Minutes Intravenous On call to O.R. 03/24/21 1835 03/25/21 1433      Subjective  The patient is resting comfortably. No new complaints.  Objective   Vitals:  Vitals:   03/30/21 1100 03/30/21 1154  BP:  (!) 161/62  Pulse:  (!) 57  Resp: 18 20  Temp:  98.3 F (36.8 C)  SpO2:  92%    Exam:  Constitutional:  The patient is awake, alert, and oriented x 3. No acute distress. Respiratory:  No increased work of breathing. No wheezes, rales, or rhonchi No tactile fremitus Cardiovascular:  Regular rate and rhythm No murmurs, ectopy, or gallups. No lateral PMI. No thrills. Abdomen:  Abdomen is soft, non-tender, non-distended No hernias, masses, or organomegaly Normoactive bowel sounds.  Musculoskeletal:  No cyanosis, clubbing, or edema Skin:  No rashes, lesions, ulcers palpation of skin: no induration or nodules Neurologic:  CN 2-12 intact Sensation all 4 extremities intact Psychiatric:  Mental status Mood, affect appropriate Orientation to person, place, time  judgment and insight appear intact   I have personally reviewed the following:   Today's Data  Vitals  Lab Data    Micro Data  Urine culture - no growth  Imaging  X-ray of the pelvis  Cardiology Data  EKG  Other Data    Scheduled Meds:  acidophilus  1 capsule Oral Q1200   amiodarone  200 mg Oral Daily   aspirin  81 mg Oral BID   docusate sodium  100 mg Oral BID   docusate sodium  200 mg Oral Once   ezetimibe  10 mg Oral QPM   famotidine  10 mg Oral Daily   febuxostat  40 mg Oral QHS   feeding supplement  237 mL Oral BID BM   hydrALAZINE  50 mg Oral Q6H   insulin aspart  0-5 Units Subcutaneous QHS   insulin aspart  0-9 Units Subcutaneous TID WC   isosorbide mononitrate  30 mg Oral QHS   levothyroxine  50 mcg Oral QAC breakfast   metoprolol tartrate  12.5 mg Oral BID   omega-3 acid  ethyl esters  2 g Oral Q1200   pantoprazole  40 mg Oral Daily   polyethylene glycol  17 g Oral QODAY   povidone-iodine  2 application Topical Once   pravastatin  40 mg Oral QHS   ranolazine  500 mg Oral BID   Continuous Infusions:  cefTRIAXone (ROCEPHIN)  IV Stopped (03/30/21 1008)    Principal Problem:   Closed left hip fracture (HCC) Active Problems:   Anemia due to stage 4 chronic kidney disease (HCC)   Atrial fibrillation (HCC)   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  Patient luckily fell on the side that she is planning on getting a hip replacement in May 2023.  Patient is not on any antiplatelets including Plavix or systemic anticoagulants such as Eliquis due to history of recurrent GI bleeding.  The patient underwent ORIF of hip on 03/24/2021. She has been doing well with PT. The patient has had BM's in the last 24 hours. One smaller and one larger. She would be discharged to home today with home health. However, today the family has expressed a wish for the patient to go to SNF. I have relayed this wish to [facility] and they are working on it.  Constipation Resolved. Continue to monitor.  S/P total left hip arthroplasty- (present on admission) The patient has tolerated the procedure well. Orthopedic surgery has stated that the patient may be discharged, however, the family now wishes for the patient to go to SNF. TOC is working on it.  Gastroesophageal reflux- (present on  admission) Continue Pepcid and Protonix.  OSA on CPAP Continue CPAP.  Chronic systolic CHF (congestive heart failure) ([place])- (present on admission) Euvolemic.  Continue Lasix.  Pacemaker Medtronic device- (present on admission) Chronic.  History of complete heart block.  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  Coronary artery disease- (present on admission) Is on Ranexa, Imdur.  she is on amiodarone.  she is not taking antiplatelets including aspirin or Plavix due to history of GI  bleeding.  She follows regularly with cardiology.  Dyslipidemia- (present on admission) Is on Repatha, Zetia, Lovaza, Pravachol.  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  She follows with nephrology in [facility].  Benign hypertension with CKD (chronic kidney disease) stage IV ([place])- (present on admission) Continue home medications. Pt blood pressures were quite high overnight. She is receiving imdur 30 mg daily and hydralazine 50 mg every six hours.daily. I have added metoprolol 12.5 mg bid. I will add amlodipine as blood pressure is down a little bit.  Atrial fibrillation ([place])- (present on admission) Has a permanent pacemaker.  she is on amiodarone.  She has a Multimedia programmer.  She is not on anticoagulation due to history of GI bleeding.  Anemia due to stage 4 chronic kidney disease ([place])- (present on admission) Chronic. Hemoglobin has dropped from 10.2 on 03/26/2021 to 8.7 on 03/28/2021. FOBT is pending.   I have seen and examined this patient myself. I have spent 32 minutes in her evaluation and care.  DVT prophylaxis: SCD's Code Status: Full Code Family Communication: Family was at bedside Disposition Plan: To home with home health PT/OT   Sybilla Malhotra, DO [facility] Direct contact: see [email or web address]   7PM-7AM contact night coverage as above 03/30/2021, 3:37 PM  LOS: 3 days

## 2021-03-30 NOTE — TOC Progression Note (Addendum)
Transition of Care [facility]) - Progression Note    Patient Details  Name: YARIANNA VARBLE MRN: [id number] Date of Birth: [birth year]  Transition of Care [facility]) CM/SW Contact  Ross Ludwig, [place] Phone Number: 03/30/2021, 5:59 PM  Clinical Narrative:     Still waiting on bed offers, patient's family prefers [facility], sent message to Linus Orn, she will review Monday [place].  Expected Discharge Plan: [place] Barriers to Discharge: No Barriers Identified  Expected Discharge Plan and Services Expected Discharge Plan: [place] Choice: [place] arrangements for the past 2 months: Single Family Home                           HH Arranged: RN, PT [facility] Agency: [place] Date [place]: 03/27/21 Time [place]: 1330 Representative spoke with at [place]: Tommi Rumps, RN   Social Determinants of Health ([place]) Interventions    Readmission Risk Interventions No flowsheet data found.

## 2021-03-31 LAB — GLUCOSE, CAPILLARY
Glucose-Capillary: 200 mg/dL — ABNORMAL HIGH (ref 70–99)
Glucose-Capillary: 205 mg/dL — ABNORMAL HIGH (ref 70–99)

## 2021-03-31 LAB — CBC WITH DIFFERENTIAL/PLATELET
Abs Immature Granulocytes: 0.1 10*3/uL — ABNORMAL HIGH (ref 0.00–0.07)
Basophils Absolute: 0 10*3/uL (ref 0.0–0.1)
Basophils Relative: 1 %
Eosinophils Absolute: 0.5 10*3/uL (ref 0.0–0.5)
Eosinophils Relative: 8 %
HCT: 27.2 % — ABNORMAL LOW (ref 36.0–46.0)
Hemoglobin: 8.4 g/dL — ABNORMAL LOW (ref 12.0–15.0)
Immature Granulocytes: 2 %
Lymphocytes Relative: 9 %
Lymphs Abs: 0.5 10*3/uL — ABNORMAL LOW (ref 0.7–4.0)
MCH: 32.3 pg (ref 26.0–34.0)
MCHC: 30.9 g/dL (ref 30.0–36.0)
MCV: 104.6 fL — ABNORMAL HIGH (ref 80.0–100.0)
Monocytes Absolute: 0.6 10*3/uL (ref 0.1–1.0)
Monocytes Relative: 10 %
Neutro Abs: 3.9 10*3/uL (ref 1.7–7.7)
Neutrophils Relative %: 70 %
Platelets: 182 10*3/uL (ref 150–400)
RBC: 2.6 MIL/uL — ABNORMAL LOW (ref 3.87–5.11)
RDW: 13.4 % (ref 11.5–15.5)
WBC: 5.5 10*3/uL (ref 4.0–10.5)
nRBC: 0.5 % — ABNORMAL HIGH (ref 0.0–0.2)

## 2021-03-31 LAB — BRAIN NATRIURETIC PEPTIDE: B Natriuretic Peptide: 789.8 pg/mL — ABNORMAL HIGH (ref 0.0–100.0)

## 2021-03-31 LAB — BASIC METABOLIC PANEL
Anion gap: 6 (ref 5–15)
BUN: 51 mg/dL — ABNORMAL HIGH (ref 8–23)
CO2: 23 mmol/L (ref 22–32)
Calcium: 9.5 mg/dL (ref 8.9–10.3)
Chloride: 107 mmol/L (ref 98–111)
Creatinine, Ser: 1.93 mg/dL — ABNORMAL HIGH (ref 0.44–1.00)
GFR, Estimated: 26 mL/min — ABNORMAL LOW (ref >60)
Glucose, Bld: 228 mg/dL — ABNORMAL HIGH (ref 70–99)
Potassium: 5 mmol/L (ref 3.5–5.1)
Sodium: 136 mmol/L (ref 135–145)

## 2021-03-31 MED ORDER — HYDRALAZINE HCL 50 MG PO TABS: 50.0000 mg | ORAL_TABLET | Freq: Four times a day (QID) | ORAL | 0 refills | Status: DC

## 2021-03-31 MED ORDER — INSULIN GLARGINE 100 UNIT/ML SOLOSTAR PEN: 3.0000 [IU] | PEN_INJECTOR | Freq: Every day | SUBCUTANEOUS | 0 refills | Status: DC

## 2021-03-31 MED ORDER — AMOXICILLIN-POT CLAVULANATE 875-125 MG PO TABS: 1.0000 | ORAL_TABLET | Freq: Two times a day (BID) | ORAL | 0 refills | Status: AC

## 2021-03-31 MED ORDER — RANOLAZINE ER 500 MG PO TB12: 500.0000 mg | ORAL_TABLET | Freq: Two times a day (BID) | ORAL | 0 refills | Status: DC

## 2021-03-31 MED ORDER — GLUCERNA SHAKE PO LIQD
237.0000 mL | Freq: Three times a day (TID) | ORAL | Status: DC
Start: 2021-03-31 — End: 2021-03-31
  Administered 2021-03-31: 237 mL via ORAL
  Filled 2021-03-31 (×2): qty 237

## 2021-03-31 MED ORDER — RISAQUAD PO CAPS: 1.0000 | ORAL_CAPSULE | Freq: Every day | ORAL | 0 refills | Status: DC

## 2021-03-31 MED ORDER — GLUCERNA SHAKE PO LIQD: 237.0000 mL | Freq: Three times a day (TID) | ORAL | 0 refills | Status: DC

## 2021-03-31 MED ORDER — METOPROLOL TARTRATE 25 MG PO TABS: 12.5000 mg | ORAL_TABLET | Freq: Two times a day (BID) | ORAL | 0 refills | Status: DC

## 2021-03-31 MED ORDER — BLOOD GLUCOSE MONITOR KIT: PACK | 0 refills | Status: AC

## 2021-03-31 NOTE — Progress Notes (Signed)
Nutrition Follow-up ° °DOCUMENTATION CODES:  ° °Not applicable ° °INTERVENTION:  °- will d/c Ensure Surgery. °- will order Glucerna Shake TID, each supplement provides 220 kcal and 10 grams of protein. °- weigh patient today. ° ° °NUTRITION DIAGNOSIS:  ° °Increased nutrient needs related to post-op healing, hip fracture as evidenced by estimated needs. -ongoing ° °GOAL:  ° °Patient will meet greater than or equal to 90% of their needs -met on average ° °MONITOR:  ° °PO intake, Supplement acceptance, Labs, Weight trends, Skin ° ° °ASSESSMENT:  ° °81 y.o. female with PMH significant for DM2, HTN, HLD, CAD/stent, chronic diastolic CHF, complete heart block s/p ppm, A-fib s/p Watchman device, stroke, CKD, h/o GI bleed, chronic anemia, GERD, diverticulosis, obstructive sleep apnea, hypothyroidism, vitamin D deficiency, osteoarthritis of hip.  Patient was brought to the ED on 03/23/2021 after a fall at home from losing her balance. Admitted for left hip fracture. ° °Patient is POD #6 L posterior total hip repair. Diet advanced later that day to Carb Modified. The only documented meal intake since admission was 85% of dinner on 2/25. Per review of order, she has been accepting Ensure Surgery 90% of the time offered. ° °Noted CBGs and will change oral nutrition supplement as outlined above d/t this.  ° °She has not been weighed since admission on 2/19. Non-pitting edema to RLE and mild pitting edema to LLE documented in the edema section of flow sheet.  °  °She is noted to have been pending SNF bed offers, but per TOC note today plan is now to go home with [facility].  ° ° °Labs reviewed; CBGs: 200 and 205 mg/dl, BUN: 51 mg/dl, creatinine: 1.93 mg/dl, GFR: 26 ml/min.  ° °Medications reviewed; 1 capsule risaquad/day, 100 mg colace BID, 10 mg pepcid/day, sliding scale novolog, 50 mcg oral synthroid/day, 2 g lovaza/day, 40 mg oral protonix/day, 17 g miralax every other day. ° ° ° °Diet Order:   °Diet Order   ° °       °   Diet Carb Modified Fluid consistency: Thin; Room service appropriate? Yes  Diet effective now       °  ° °  °  ° °  ° ° °EDUCATION NEEDS:  ° °No education needs have been identified at this time ° °Skin:  Skin Assessment: Skin Integrity Issues: °Skin Integrity Issues:: Incisions °Incisions: L hip (2/21) ° °Last BM:  2/26 (type 5 x1, medium amount) ° °Height:  ° °Ht Readings from Last 1 Encounters:  °03/23/21 5' 3" (1.6 m)  ° ° °Weight:  ° °Wt Readings from Last 1 Encounters:  °03/23/21 81.6 kg  ° ° ° °BMI:  Body mass index is 31.89 kg/m². ° °Estimated Nutritional Needs:  °Kcal:  1550-1750 kcal °Protein:  75-90 grams °Fluid:  >/= 1.6 L/day ° ° ° ° °Jessica Ostheim, MS, RD, LDN °Inpatient Clinical Dietitian °RD pager # available in AMION  °After hours/weekend pager # available in AMION ° °

## 2021-03-31 NOTE — TOC Progression Note (Signed)
Transition of Care [facility]) - Progression Note    Patient Details  Name: Amanda [name] MRN: [id number] Date of Birth: Dec 27, 1940  [facility] CM/SW Contact  Purcell Mouton, RN Phone Number: 03/31/2021, 12:30 PM  Clinical Narrative:    Spoke with pt and family concerning SNF, Insurance and [place]. Pt will [facility] for [facility]. Family will transport pt home. TOC/CM signing off.    Expected Discharge Plan: [place] Barriers to Discharge: No Barriers Identified  Expected Discharge Plan and Services Expected Discharge Plan: [place] Choice: [place] arrangements for the past 2 months: Single Family Home                           HH Arranged: RN, PT [facility] Agency: [place] Date [place]: 03/27/21 Time [place]: 1330 Representative spoke with at [place]: Tommi Rumps, RN   Social Determinants of Health ([place]) Interventions    Readmission Risk Interventions No flowsheet data found.

## 2021-03-31 NOTE — Progress Notes (Signed)
Pt discharged with daughter . Patient taken down to front entrance via wheel chair by Manuela Schwartz, NT3. D/C instructions gone over and patient denied further questions. PIV removed.

## 2021-03-31 NOTE — Discharge Summary (Signed)
Physician Discharge Summary   Patient: Amanda [name] MRN: [id number] DOB: 08/18/1940  Admit date:     03/23/2021  Discharge date: 03/31/21  Discharge Physician: Karie Kirks   PCP: Street, Sharon [place], MD   Recommendations at discharge:    Discharge to home with home health PT/RN Follow up with PCP in 7-10 days. Chemistry to be drawn on that visit and reported to PCP. Check glucoses twice daily. Record those values and take them in to PCP on this visit. Follow up with orthopedic surgery as directed.  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 female with PMH significant for DM2, HTN, HLD, CAD/stent, chronic diastolic CHF, complete heart block s/p ppm, A-fib s/p Watchman device, stroke, CKD, h/o GI bleed, chronic anemia, GERD, diverticulosis, obstructive sleep apnea, hypothyroidism, vitamin D deficiency, osteoarthritis of hip. Patient was brought to the ED on 03/23/2021 after a fall at home from losing her balance.  She landed on her left hip and started having severe pain.   In the ED, x-ray of left hip showed intertrochanteric fracture. Lab with BUN/creatinine 38/2.6, glucose 234, hemoglobin 13.3 EDP admitted to hospitalist service Orthopedics consulted The patient underwent ORIF on 03/25/2021. She has tolerated the procedure well.   She  has worked with PT this morning. She has done well with them, but has not met necessary goals for safety on discharge to home. Orthopedic surgery has cleared her for discharge to home when these goals are met. I have discussed this with PT.   The patient has not had a BM for 3 days. She will be discharged to home after she has a BM.   The patient's family has decided that they want the patient to go to SNF. TOC is working on it.  Assessment and Plan: * Closed left hip fracture ([place])- (present on admission) Admit to med/surg bed.  Patient luckily fell on the side that she is planning on getting a hip replacement in May 2023.  Patient is not on any antiplatelets including Plavix or systemic anticoagulants such as Eliquis due to history of recurrent GI bleeding.  The patient underwent ORIF of hip on 03/24/2021. She has been doing well with PT. The patient has had BM's in the last 24 hours. One smaller and one larger. She would be discharged to home today with home health. However, today the family has expressed a wish for the patient to go to SNF. I have relayed this wish to [facility] and they are working on it.  Constipation Resolved. Continue to monitor.  S/P total left hip arthroplasty- (present on admission) The patient has tolerated the procedure well. Orthopedic surgery has stated that the patient may be discharged, however, the family now wishes for the patient to go to SNF. TOC is working on it.  Gastroesophageal reflux- (present on admission)  Continue Pepcid and Protonix.  OSA on CPAP Continue CPAP.  Chronic systolic CHF (congestive heart failure) ([place])- (present on admission) Euvolemic.  Continue Lasix.  Pacemaker Medtronic device- (present on admission) Chronic.  History of complete heart block.  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  Coronary artery disease-  (present on admission) Is on Ranexa, Imdur.  she is on amiodarone.  she is not taking antiplatelets including aspirin or Plavix due to history of GI bleeding.  She follows regularly with cardiology.  Dyslipidemia- (present on admission) Is on Repatha, Zetia, Lovaza, Pravachol.  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  She follows with nephrology in [facility].  Benign hypertension with CKD (chronic kidney disease) stage IV ([place])- (present on admission) Continue home medications. Pt blood pressures were quite high overnight. She is receiving imdur 30 mg daily and hydralazine 50 mg every six hours.daily. I have added metoprolol 12.5 mg bid. I will add amlodipine as blood pressure is down a little bit.  Atrial fibrillation ([place])- (present on admission) Has a permanent pacemaker.  she is on amiodarone.  She has a Multimedia programmer.  She is not on anticoagulation due to history of GI bleeding.  Anemia due to stage 4 chronic kidney disease ([place])- (present on admission) Chronic. Hemoglobin has dropped from 10.2 on 03/26/2021 to 8.7 on 03/28/2021. FOBT is pending.   Consultants: Orthopedic surgery Procedures performed: ORIF of left hip  Disposition: Home Diet recommendation:  Discharge Diet Orders (From admission, onward)     Start     Ordered   03/31/21 0000  Diet - low sodium heart healthy        03/31/21 1404   03/31/21 0000  Diet Carb Modified        03/31/21 1404           Cardiac and Carb modified diet  DISCHARGE MEDICATION: Allergies as of 03/31/2021       Reactions   Ciprofloxacin Other (See Comments)   Achilles tendon pain   Contrast Media [iodinated Contrast Media]    Avoid due to stage 4 kidney disease    Nsaids    Avoid due to stage 4 kidney disease    Statins Other (See Comments)   Joint pain, tolerates pravastatin         Medication List     STOP taking these medications    nitroGLYCERIN 0.4 MG  SL tablet Commonly known as: NITROSTAT   zolpidem 5 MG tablet Commonly known as: AMBIEN       TAKE these medications    acidophilus Caps capsule Take 1 capsule by mouth daily at 12 noon.   amiodarone 200 MG tablet Commonly known as: PACERONE TAKE 1 TABLET BY MOUTH DAILY.   amoxicillin-clavulanate 875-125 MG tablet Commonly known as: Augmentin Take 1 tablet by mouth 2 (two) times daily for 3 days.   aspirin 81 MG chewable tablet Chew 1 tablet (81 mg total) by mouth 2 (two) times daily for 28 days.   BENEFIBER DRINK MIX PO Mix 1 capful with beverage and drink every other day   BIOTIN PO Take 600 mcg by mouth 2 (two) times daily.   blood glucose meter kit and supplies Kit Dispense based on patient and insurance preference. Use up to four times daily as directed.   Coenzyme Q10 100 MG Tabs Take 100 mg by  mouth daily at 12 noon.   docusate sodium 100 MG capsule Commonly known as: COLACE Take 1 capsule (100 mg total) by mouth 2 (two) times daily.   estradiol 0.1 MG/GM vaginal cream Commonly known as: ESTRACE Place 1 Applicatorful vaginally every Monday, Wednesday, and Friday.   ezetimibe 10 MG tablet Commonly known as: ZETIA TAKE 1 TABLET BY MOUTH EVERY EVENING. What changed: when to take this   famotidine 40 MG tablet Commonly known as: PEPCID Take 40 mg by mouth daily.   febuxostat 40 MG tablet Commonly known as: ULORIC Take 40 mg by mouth at bedtime.   feeding supplement (GLUCERNA SHAKE) Liqd Take 237 mLs by mouth 3 (three) times daily between meals.   ferrous sulfate 325 (65 FE) MG EC tablet Take 325 mg by mouth 2 (two) times daily with breakfast and lunch.   Florajen Acidophilus Caps Take 1 capsule by mouth daily at 12 noon.   folic acid 762 MCG tablet Commonly known as: FOLVITE Take 800 mcg by mouth daily at 12 noon.   furosemide 20 MG tablet Commonly known as: LASIX Take 40 mg by mouth 2 (two) times daily with breakfast and lunch.    hydrALAZINE 50 MG tablet Commonly known as: APRESOLINE Take 1 tablet (50 mg total) by mouth every 6 (six) hours.   HYDROcodone-acetaminophen 5-325 MG tablet Commonly known as: NORCO/VICODIN Take 1-2 tablets by mouth every 6 (six) hours as needed for severe pain.   insulin glargine 100 UNIT/ML Solostar Pen Commonly known as: LANTUS Inject 3 Units into the skin daily.   isosorbide mononitrate 30 MG 24 hr tablet Commonly known as: IMDUR TAKE 1 TABLET BY MOUTH DAILY   levothyroxine 50 MCG tablet Commonly known as: SYNTHROID Take 50 mcg by mouth daily before breakfast.   methocarbamol 500 MG tablet Commonly known as: ROBAXIN Take 1 tablet (500 mg total) by mouth every 6 (six) hours as needed for muscle spasms.   metoprolol tartrate 25 MG tablet Commonly known as: LOPRESSOR Take 0.5 tablets (12.5 mg total) by mouth 2 (two) times daily.   omega-3 acid ethyl esters 1 g capsule Commonly known as: LOVAZA Take 2 capsules (2 g total) by mouth daily at 12 noon.   OSTEO BI-FLEX REGULAR STRENGTH PO Take 1 tablet by mouth daily at 12 noon. Unknown strength   pantoprazole 40 MG tablet Commonly known as: PROTONIX TAKE 1 TABLET BY MOUTH DAILY   polyethylene glycol powder 17 GM/SCOOP powder Commonly known as: GLYCOLAX/MIRALAX Take 17 g by mouth every other day. Alternating between miralax and benefiber   pravastatin 40 MG tablet Commonly known as: PRAVACHOL Take 40 mg by mouth at bedtime.   PreserVision AREDS 2 Caps Take 1 capsule by mouth 2 (two) times daily. Unknow strength   ranolazine 500 MG 12 hr tablet Commonly known as: RANEXA Take 1 tablet (500 mg total) by mouth 2 (two) times daily. What changed:  medication strength how much to take   Refresh Relieva 0.5-0.9 % ophthalmic solution Generic drug: carboxymethylcellul-glycerin Place 1 drop into both eyes daily as needed for dry eyes.   Repatha 140 MG/ML Sosy Generic drug: Evolocumab Inject 140 mg into the skin every  14 (fourteen) days. Every other Thursday   vitamin B-12 1000 MCG tablet Commonly known as: CYANOCOBALAMIN Take 1,000 mcg by mouth daily at 12 noon.   Vitamin D3 50 MCG (2000 UT) Tabs Take 2,000 Units by mouth daily at 12 noon.  Discharge Care Instructions  (From admission, onward)           Start     Ordered   03/31/21 0000  Discharge wound care:       Comments: As per orthopedic surgery.   03/31/21 1404            Follow-up Information     Paralee Cancel, MD. Schedule an appointment as soon as possible for a visit in 2 week(s).   Specialty: Orthopedic Surgery Contact information: [address] [place] [address] [phone]                 Discharge Exam: Danley Danker Weights   03/23/21 2043  Weight: 81.6 kg   Vitals:   03/31/21 0939 03/31/21 1249  BP: (!) 162/63 (!) 140/57  Pulse: (!) 56   Resp:    Temp:    SpO2:     Exam:  Constitutional:  The patient is awake, alert, and oriented x 3. No acute distress. Respiratory:  No increased work of breathing. No wheezes, rales, or rhonchi No tactile fremitus Cardiovascular:  Regular rate and rhythm No murmurs, ectopy, or gallups. No lateral PMI. No thrills. Abdomen:  Abdomen is soft, non-tender, non-distended No hernias, masses, or organomegaly Normoactive bowel sounds.  Musculoskeletal:  No cyanosis, clubbing, or edema Skin:  No rashes, lesions, ulcers palpation of skin: no induration or nodules Neurologic:  CN 2-12 intact Sensation all 4 extremities intact Psychiatric:  Mental status Mood, affect appropriate Orientation to person, place, time  judgment and insight appear intact   Condition at discharge: fair  The results of significant diagnostics from this hospitalization (including imaging, microbiology, ancillary and laboratory) are listed below for reference.   Imaging Studies: DG Pelvis Portable  Result Date: 03/25/2021 CLINICAL DATA:  S/p  closed left hip fracture EXAM: PORTABLE PELVIS 1-2 VIEWS COMPARISON:  X-ray pelvis 03/23/2018 FINDINGS: Total left hip arthroplasty. There is no evidence of pelvic fracture or diastasis. No pelvic bone lesions are seen. Postoperative ileus soft tissue edema and emphysema. Vascular calcifications. IMPRESSION: Status post total left hip arthroplasty. No radiographic findings suggest surgical hardware complication. Electronically Signed   By: Iven Finn M.D.   On: 03/25/2021 17:34   DG CHEST PORT 1 VIEW  Result Date: 03/30/2021 CLINICAL DATA:  Shortness of breath and cough. EXAM: PORTABLE CHEST 1 VIEW COMPARISON:  Chest radiograph dated 03/23/2021. FINDINGS: No focal consolidation, pleural effusion or pneumothorax. Mild cardiomegaly. Calcification of the mitral annulus and left atrial appendage occlusive device. Left pectoral pacemaker. No acute osseous pathology. Atherosclerotic calcification of the aorta. IMPRESSION: 1. No acute cardiopulmonary process. 2. Mild cardiomegaly. Electronically Signed   By: Anner Crete M.D.   On: 03/30/2021 23:39   DG Chest Port 1 View  Result Date: 03/23/2021 CLINICAL DATA:  Preoperative chest radiograph prior to hip surgery. EXAM: PORTABLE CHEST 1 VIEW COMPARISON:  05/04/2020 FINDINGS: Mild cardiomegaly and LEFT-sided pacemaker again noted. There is no evidence of focal airspace disease, pulmonary edema, suspicious pulmonary nodule/mass, pleural effusion, or pneumothorax. No acute bony abnormalities are identified. IMPRESSION: Mild cardiomegaly without evidence of acute cardiopulmonary disease. Electronically Signed   By: Margarette Canada M.D.   On: 03/23/2021 21:14   DG Hip Unilat With Pelvis 2-3 Views Left  Result Date: 03/23/2021 CLINICAL DATA:  Recent fall with hip pain, initial encounter EXAM: DG HIP (WITH OR WITHOUT PELVIS) 2-3V LEFT COMPARISON:  None. FINDINGS: Degenerative changes of the hip joints are noted left considerably worse than right.  Undisplaced  intratrochanteric left femoral fracture is noted. No soft tissue changes are seen. IMPRESSION: Left intratrochanteric fracture. Degenerative changes of left hip joint. Electronically Signed   By: Inez Catalina M.D.   On: 03/23/2021 21:09    Microbiology: Results for orders placed or performed during the hospital encounter of 03/23/21  Resp Panel by RT-PCR (Flu A&B, Covid) Nasopharyngeal Swab     Status: None   Collection Time: 03/23/21  9:46 PM   Specimen: Nasopharyngeal Swab; Nasopharyngeal(NP) swabs in vial transport medium  Result Value Ref Range Status   SARS Coronavirus 2 by RT PCR NEGATIVE NEGATIVE Final    Comment: (NOTE) SARS-CoV-2 target nucleic acids are NOT DETECTED.  The SARS-CoV-2 RNA is generally detectable in upper respiratory specimens during the acute phase of infection. The lowest concentration of SARS-CoV-2 viral copies this assay can detect is 138 copies/mL. A negative result does not preclude SARS-Cov-2 infection and should not be used as the sole basis for treatment or other patient management decisions. A negative result may occur with  improper specimen collection/handling, submission of specimen other than nasopharyngeal swab, presence of viral mutation(s) within the areas targeted by this assay, and inadequate number of viral copies(<138 copies/mL). A negative result must be combined with clinical observations, patient history, and epidemiological information. The expected result is Negative.  Fact Sheet for Patients:  EntrepreneurPulse.com.au  Fact Sheet for Healthcare Providers:  IncredibleEmployment.be  This test is no t yet approved or cleared by the [place] FDA and  has been authorized for detection and/or diagnosis of SARS-CoV-2 by FDA under an Emergency Use Authorization (EUA). This EUA will remain  in effect (meaning this test can be used) for the duration of the COVID-19 declaration under Section 564(b)(1)  of the Act, 21 U.S.C.section 360bbb-3(b)(1), unless the authorization is terminated  or revoked sooner.       Influenza A by PCR NEGATIVE NEGATIVE Final   Influenza B by PCR NEGATIVE NEGATIVE Final    Comment: (NOTE) The Xpert Xpress SARS-CoV-2/FLU/RSV plus assay is intended as an aid in the diagnosis of influenza from Nasopharyngeal swab specimens and should not be used as a sole basis for treatment. Nasal washings and aspirates are unacceptable for Xpert Xpress SARS-CoV-2/FLU/RSV testing.  Fact Sheet for Patients: EntrepreneurPulse.com.au  Fact Sheet for Healthcare Providers: IncredibleEmployment.be  This test is not yet approved or cleared by the [place] FDA and has been authorized for detection and/or diagnosis of SARS-CoV-2 by FDA under an Emergency Use Authorization (EUA). This EUA will remain in effect (meaning this test can be used) for the duration of the COVID-19 declaration under Section 564(b)(1) of the Act, 21 U.S.C. section 360bbb-3(b)(1), unless the authorization is terminated or revoked.  Performed at [facility], [place] [address], [place], [place] [address]   Surgical pcr screen     Status: None   Collection Time: 03/25/21  2:19 AM   Specimen: Nasal Mucosa; Nasal Swab  Result Value Ref Range Status   MRSA, PCR NEGATIVE NEGATIVE Final   Staphylococcus aureus NEGATIVE NEGATIVE Final    Comment: (NOTE) The Xpert SA Assay (FDA approved for NASAL specimens in patients 55 years of age and older), is one component of a comprehensive surveillance program. It is not intended to diagnose infection nor to guide or monitor treatment. Performed at [facility], [place] [address] [place], [place] [address]   Urine Culture     Status: Abnormal   Collection Time: 03/25/21  2:54 PM  Specimen: Urine, Clean Catch  Result Value Ref Range Status   Specimen Description   Final    URINE, CLEAN  CATCH Performed at [facility], [place] [address] [place], [place] [address]    Special Requests   Final    NONE Performed at [facility], [place] [address] [place], [place] [address]    Culture >=100,000 COLONIES/mL ENTEROBACTER AEROGENES (A)  Final   Report Status 03/28/2021 FINAL  Final   Organism ID, Bacteria ENTEROBACTER AEROGENES (A)  Final      Susceptibility   Enterobacter aerogenes - MIC*    CEFAZOLIN >=64 RESISTANT Resistant     CEFEPIME <=0.12 SENSITIVE Sensitive     CEFTRIAXONE 1 SENSITIVE Sensitive     CIPROFLOXACIN <=0.25 SENSITIVE Sensitive     GENTAMICIN <=1 SENSITIVE Sensitive     IMIPENEM <=0.25 SENSITIVE Sensitive     NITROFURANTOIN 128 RESISTANT Resistant     TRIMETH/SULFA <=20 SENSITIVE Sensitive     PIP/TAZO 16 SENSITIVE Sensitive     * >=100,000 COLONIES/mL ENTEROBACTER AEROGENES    Labs: CBC: Recent Labs  Lab 03/25/21 2000 03/26/21 0407 03/27/21 0404 03/28/21 0350 03/31/21 0657  WBC 6.8 7.4 9.0 6.9 5.5  NEUTROABS  --  6.8 7.8* 5.4 3.9  HGB 11.1* 10.2* 9.3* 8.7* 8.4*  HCT 35.0* 32.2* 28.2* 27.1* 27.2*  MCV 103.2* 101.3* 99.3 100.7* 104.6*  PLT 127* 124* 145* 137* 644   Basic Metabolic Panel: Recent Labs  Lab 03/25/21 2000 03/26/21 0407 03/27/21 0404 03/28/21 0350 03/31/21 0657  NA 136 135 137 137 136  K 4.6 4.3 4.9 5.2* 5.0  CL 103 105 107 107 107  CO2 23 20* _0 GLUCOSE 222* 226* 247* 226* 228*  BUN 30* 27* 44* 59* 51*  CREATININE 1.56* 1.50* 1.72* 1.67* 1.93*  CALCIUM 8.4* 8.1* 8.7* 8.7* 9.5   Liver Function Tests: Recent Labs  Lab 03/25/21 2000  AST 65*  ALT 62*  ALKPHOS 70  BILITOT 0.7  PROT 6.4*  ALBUMIN 3.7   CBG: Recent Labs  Lab 03/30/21 1151 03/30/21 1644 03/30/21 2103 03/31/21 0722 03/31/21 1145  GLUCAP 182* 208* 184* 200* 205*    Discharge time spent: greater than 30 minutes.  Signed: Karie Kirks, DO [facility] 03/31/2021

## 2021-03-31 NOTE — Progress Notes (Signed)
Physical Therapy Treatment Patient Details Name: Amanda [name] MRN: [id number] DOB: May 20, 1940 Today's Date: 03/31/2021   History of Present Illness Amanda [name] is an 81 y.o. female s/p L THA posterior hip due to non displaced L intertrochanteric hip fracture in setting of advanced L hip OA and fall at home. PMH: A-fib, complete heart block s/p permanent pacemaker, Watchman device, CKD stage IV, anemia CKD, type 2 diabetes, coronary artery disease, CHF, OSA on CPAP, left hip OA, HTN, MI, NSTEMI    PT Comments    Patient is progressing well with mobility and demonstrated excellent recall for posterior hip precautions and technique with RW for transfers and gait. Pt ambulated ~200' with RW and no LOB noted and safe guarding provided by family. Pt completed bed mobility and reviewed use of belt to assist Lt LE. Pt required min-mod assist for return to supine to bring LE's onto bed; pt able to move from supine to EOB without assist and extra time. Pt reports she has two recliners at home that are comfortable and that she can sleep in either of them initially while recovering. Pt is at safe level to discharge home and has all DME. Continue to recommend HHPT and assist from family. Acute PT will progress pt as able.    Recommendations for follow up therapy are one component of a multi-disciplinary discharge planning process, led by the attending physician.  Recommendations may be updated based on patient status, additional functional criteria and insurance authorization.  Follow Up Recommendations  Home health PT     Assistance Recommended at Discharge Frequent or constant Supervision/Assistance  Patient can return home with the following A little help with walking and/or transfers;A little help with bathing/dressing/bathroom;Assistance with cooking/housework;Assist for transportation;Help with stairs or ramp for entrance   Equipment Recommendations  None recommended by PT     Recommendations for Other Services       Precautions / Restrictions Precautions Precautions: Posterior Hip;Fall Restrictions Weight Bearing Restrictions: No LLE Weight Bearing: Weight bearing as tolerated     Mobility  Bed Mobility Overal bed mobility: Needs Assistance Bed Mobility: Sit to Supine, Supine to Sit     Supine to sit: Min guard, Supervision Sit to supine: Min assist, Mod assist   General bed mobility comments: Pt completed bed mobility supine<>sit multiple times to assess assist level needed and determine need for hospital bed. Mod assist required for bil LE to raise onto bed with flat bed. Pt required min assist only for LE with HOB slightly elevated and cues for use of belt to assist Lt LE onto bed. Min guard only for supine>sit wiht HOB flat or elevated with extra time.    Transfers Overall transfer level: Needs assistance Equipment used: Rolling walker (2 wheels) Transfers: Sit to/from Stand Sit to Stand: Supervision           General transfer comment: supervision for safety, pt with good recall for technique to maintain posterior precautions    Ambulation/Gait Ambulation/Gait assistance: Supervision Gait Distance (Feet): 200 Feet Assistive device: Rolling walker (2 wheels) Gait Pattern/deviations: Step-through pattern, Decreased stride length, Decreased weight shift to left Gait velocity: decr     General Gait Details: pt maintained safe proximity to RW with gait inculding during turns. pt aware of posteiror hip precautions not to pivot Lt LE with turning. pt's daughter provided safe guarding technique with supervision from therapist.   Chief Strategy Officer  Modified Rankin (Stroke Patients Only)       Balance Overall balance assessment: Needs assistance Sitting-balance support: Feet supported Sitting balance-Leahy Scale: Good     Standing balance support: During functional activity, Bilateral upper extremity  supported, Reliant on assistive device for balance Standing balance-Leahy Scale: Fair                              Cognition Arousal/Alertness: Awake/alert Behavior During Therapy: WFL for tasks assessed/performed Overall Cognitive Status: Within Functional Limits for tasks assessed                                          Exercises      General Comments        Pertinent Vitals/Pain Pain Assessment Pain Assessment: 0-10 Pain Score: 5  Pain Location: L hip Pain Descriptors / Indicators: Sore, Discomfort, Grimacing Pain Intervention(s): Limited activity within patient's tolerance, Monitored during session, Premedicated before session, Repositioned    Home Living                          Prior Function            PT Goals (current goals can now be found in the care plan section) Acute Rehab PT Goals Patient Stated Goal: home with family to assist PT Goal Formulation: With patient/family Time For Goal Achievement: 04/09/21 Potential to Achieve Goals: Good Progress towards PT goals: Progressing toward goals    Frequency    Min 5X/week      PT Plan Current plan remains appropriate    Co-evaluation              AM-PAC PT "6 Clicks" Mobility   Outcome Measure  Help needed turning from your back to your side while in a flat bed without using bedrails?: A Little Help needed moving from lying on your back to sitting on the side of a flat bed without using bedrails?: A Little Help needed moving to and from a bed to a chair (including a wheelchair)?: A Little Help needed standing up from a chair using your arms (e.g., wheelchair or bedside chair)?: A Little Help needed to walk in hospital room?: A Little Help needed climbing 3-5 steps with a railing? : A Little 6 Click Score: 18    End of Session Equipment Utilized During Treatment: Gait belt Activity Tolerance: Patient tolerated treatment well Patient left: with call  bell/phone within reach;with family/visitor present;in chair Nurse Communication: Mobility status PT Visit Diagnosis: Unsteadiness on feet (R26.81);Other abnormalities of gait and mobility (R26.89);Muscle weakness (generalized) (M62.81);Pain Pain - Right/Left: Left Pain - part of body: Hip     Time: 7741-2878 PT Time Calculation (min) (ACUTE ONLY): 43 min  Charges:  $Gait Training: 8-22 mins $Therapeutic Activity: 23-37 mins                     Verner Mould, DPT Acute Rehabilitation Services Office [phone] Pager [phone]    Jacques Navy 03/31/2021, 12:40 PM

## 2021-03-31 NOTE — Care Management Important Message (Signed)
Important Message  Patient Details IM Letter placed in Patients room. Name: Amanda [name] MRN: [id number] Date of Birth: 07-16-1940   Medicare Important Message Given:  Yes     Kerin Salen 03/31/2021, 12:47 PM

## 2021-04-01 DIAGNOSIS — N184 Chronic kidney disease, stage 4 (severe): Secondary | ICD-10-CM | POA: Diagnosis not present

## 2021-04-01 DIAGNOSIS — E785 Hyperlipidemia, unspecified: Secondary | ICD-10-CM | POA: Diagnosis not present

## 2021-04-01 DIAGNOSIS — S72145D Nondisplaced intertrochanteric fracture of left femur, subsequent encounter for closed fracture with routine healing: Secondary | ICD-10-CM | POA: Diagnosis not present

## 2021-04-01 DIAGNOSIS — E079 Disorder of thyroid, unspecified: Secondary | ICD-10-CM | POA: Diagnosis not present

## 2021-04-01 DIAGNOSIS — E1122 Type 2 diabetes mellitus with diabetic chronic kidney disease: Secondary | ICD-10-CM | POA: Diagnosis not present

## 2021-04-01 DIAGNOSIS — K5790 Diverticulosis of intestine, part unspecified, without perforation or abscess without bleeding: Secondary | ICD-10-CM | POA: Diagnosis not present

## 2021-04-01 DIAGNOSIS — D631 Anemia in chronic kidney disease: Secondary | ICD-10-CM | POA: Diagnosis not present

## 2021-04-01 DIAGNOSIS — I130 Hypertensive heart and chronic kidney disease with heart failure and stage 1 through stage 4 chronic kidney disease, or unspecified chronic kidney disease: Secondary | ICD-10-CM | POA: Diagnosis not present

## 2021-04-01 DIAGNOSIS — K219 Gastro-esophageal reflux disease without esophagitis: Secondary | ICD-10-CM | POA: Diagnosis not present

## 2021-04-01 DIAGNOSIS — I4891 Unspecified atrial fibrillation: Secondary | ICD-10-CM | POA: Diagnosis not present

## 2021-04-01 DIAGNOSIS — G4733 Obstructive sleep apnea (adult) (pediatric): Secondary | ICD-10-CM | POA: Diagnosis not present

## 2021-04-01 DIAGNOSIS — I5022 Chronic systolic (congestive) heart failure: Secondary | ICD-10-CM | POA: Diagnosis not present

## 2021-04-01 DIAGNOSIS — I2510 Atherosclerotic heart disease of native coronary artery without angina pectoris: Secondary | ICD-10-CM | POA: Diagnosis not present

## 2021-04-01 DIAGNOSIS — D509 Iron deficiency anemia, unspecified: Secondary | ICD-10-CM | POA: Diagnosis not present

## 2021-04-01 DIAGNOSIS — I442 Atrioventricular block, complete: Secondary | ICD-10-CM | POA: Diagnosis not present

## 2021-04-01 DIAGNOSIS — I252 Old myocardial infarction: Secondary | ICD-10-CM | POA: Diagnosis not present

## 2021-04-08 DIAGNOSIS — N3091 Cystitis, unspecified with hematuria: Secondary | ICD-10-CM | POA: Diagnosis not present

## 2021-04-08 DIAGNOSIS — R059 Cough, unspecified: Secondary | ICD-10-CM | POA: Diagnosis not present

## 2021-04-08 DIAGNOSIS — N184 Chronic kidney disease, stage 4 (severe): Secondary | ICD-10-CM | POA: Diagnosis not present

## 2021-04-08 DIAGNOSIS — E1122 Type 2 diabetes mellitus with diabetic chronic kidney disease: Secondary | ICD-10-CM | POA: Diagnosis not present

## 2021-04-09 DIAGNOSIS — N3091 Cystitis, unspecified with hematuria: Secondary | ICD-10-CM | POA: Diagnosis not present

## 2021-04-10 DIAGNOSIS — Z96642 Presence of left artificial hip joint: Secondary | ICD-10-CM | POA: Diagnosis not present

## 2021-04-10 DIAGNOSIS — R059 Cough, unspecified: Secondary | ICD-10-CM | POA: Diagnosis not present

## 2021-04-10 DIAGNOSIS — Z471 Aftercare following joint replacement surgery: Secondary | ICD-10-CM | POA: Diagnosis not present

## 2021-04-10 DIAGNOSIS — M7541 Impingement syndrome of right shoulder: Secondary | ICD-10-CM | POA: Diagnosis not present

## 2021-04-10 DIAGNOSIS — D638 Anemia in other chronic diseases classified elsewhere: Secondary | ICD-10-CM | POA: Diagnosis not present

## 2021-04-10 DIAGNOSIS — N184 Chronic kidney disease, stage 4 (severe): Secondary | ICD-10-CM | POA: Diagnosis not present

## 2021-04-10 DIAGNOSIS — S72009A Fracture of unspecified part of neck of unspecified femur, initial encounter for closed fracture: Secondary | ICD-10-CM | POA: Diagnosis not present

## 2021-04-17 DIAGNOSIS — J40 Bronchitis, not specified as acute or chronic: Secondary | ICD-10-CM | POA: Diagnosis not present

## 2021-04-17 DIAGNOSIS — J329 Chronic sinusitis, unspecified: Secondary | ICD-10-CM | POA: Diagnosis not present

## 2021-04-17 DIAGNOSIS — N184 Chronic kidney disease, stage 4 (severe): Secondary | ICD-10-CM | POA: Diagnosis not present

## 2021-04-17 DIAGNOSIS — Z8781 Personal history of (healed) traumatic fracture: Secondary | ICD-10-CM | POA: Diagnosis not present

## 2021-04-17 DIAGNOSIS — M810 Age-related osteoporosis without current pathological fracture: Secondary | ICD-10-CM | POA: Diagnosis not present

## 2021-04-17 DIAGNOSIS — D500 Iron deficiency anemia secondary to blood loss (chronic): Secondary | ICD-10-CM | POA: Diagnosis not present

## 2021-04-18 ENCOUNTER — Telehealth: Payer: Self-pay | Admitting: Cardiology

## 2021-04-18 ENCOUNTER — Encounter: Payer: Self-pay | Admitting: Cardiology

## 2021-04-18 NOTE — Telephone Encounter (Signed)
STAT if HR is under 50 or over 120 ?(normal HR is 60-100 beats per minute) ? ?What is your heart rate? 100; 120 last night ? ?Do you have a log of your heart rate readings (document readings)?   ? ?Do you have any other symptoms? fatigue ? ?

## 2021-04-18 NOTE — Telephone Encounter (Signed)
Barnetta Chapel from [facility] called again to check on status of call.  ?

## 2021-04-21 ENCOUNTER — Ambulatory Visit (INDEPENDENT_AMBULATORY_CARE_PROVIDER_SITE_OTHER): Payer: Medicare Other

## 2021-04-21 DIAGNOSIS — I442 Atrioventricular block, complete: Secondary | ICD-10-CM

## 2021-04-21 LAB — CUP PACEART REMOTE DEVICE CHECK
Battery Remaining Longevity: 154 mo
Battery Voltage: 3.04 V
Brady Statistic AP VP Percent: 0.01 %
Brady Statistic AP VS Percent: 7.88 %
Brady Statistic AS VP Percent: 0.04 %
Brady Statistic AS VS Percent: 92.07 %
Brady Statistic RA Percent Paced: 7.6 %
Brady Statistic RV Percent Paced: 0.18 %
Date Time Interrogation Session: 20230319200351
Implantable Lead Implant Date: 20210322
Implantable Lead Implant Date: 20210322
Implantable Lead Location: 753859
Implantable Lead Location: 753860
Implantable Lead Model: 5076
Implantable Lead Model: 5076
Implantable Pulse Generator Implant Date: 20210322
Lead Channel Impedance Value: 266 Ohm
Lead Channel Impedance Value: 342 Ohm
Lead Channel Impedance Value: 399 Ohm
Lead Channel Impedance Value: 418 Ohm
Lead Channel Pacing Threshold Amplitude: 0.875 V
Lead Channel Pacing Threshold Amplitude: 0.875 V
Lead Channel Pacing Threshold Pulse Width: 0.4 ms
Lead Channel Pacing Threshold Pulse Width: 0.4 ms
Lead Channel Sensing Intrinsic Amplitude: 3.875 mV
Lead Channel Sensing Intrinsic Amplitude: 3.875 mV
Lead Channel Sensing Intrinsic Amplitude: 8.5 mV
Lead Channel Sensing Intrinsic Amplitude: 8.5 mV
Lead Channel Setting Pacing Amplitude: 1.75 V
Lead Channel Setting Pacing Amplitude: 2.5 V
Lead Channel Setting Pacing Pulse Width: 0.4 ms
Lead Channel Setting Sensing Sensitivity: 2 mV

## 2021-04-22 NOTE — Telephone Encounter (Signed)
Called the patient and she stated that she had an appointment with the A-fib clinic tomorrow on Wednesday. I will call the patient and follow up with her after her A-fib clinic visit. ?

## 2021-04-23 ENCOUNTER — Ambulatory Visit (HOSPITAL_COMMUNITY)
Admission: RE | Admit: 2021-04-23 | Discharge: 2021-04-23 | Disposition: A | Discharge status: Home / Self Care | Payer: Medicare Other | Source: Ambulatory Visit | Attending: Physician Assistant | Admitting: Physician Assistant

## 2021-04-23 ENCOUNTER — Other Ambulatory Visit: Payer: Self-pay

## 2021-04-23 VITALS — BP 152/60 | HR 83 | Ht 63.0 in | Wt 187.4 lb

## 2021-04-23 DIAGNOSIS — D6869 Other thrombophilia: Secondary | ICD-10-CM | POA: Insufficient documentation

## 2021-04-23 DIAGNOSIS — I4819 Other persistent atrial fibrillation: Secondary | ICD-10-CM

## 2021-04-23 DIAGNOSIS — G4733 Obstructive sleep apnea (adult) (pediatric): Secondary | ICD-10-CM | POA: Insufficient documentation

## 2021-04-23 DIAGNOSIS — Z6833 Body mass index (BMI) 33.0-33.9, adult: Secondary | ICD-10-CM | POA: Diagnosis not present

## 2021-04-23 DIAGNOSIS — I5022 Chronic systolic (congestive) heart failure: Secondary | ICD-10-CM | POA: Diagnosis not present

## 2021-04-23 DIAGNOSIS — E669 Obesity, unspecified: Secondary | ICD-10-CM | POA: Diagnosis not present

## 2021-04-23 DIAGNOSIS — Z9989 Dependence on other enabling machines and devices: Secondary | ICD-10-CM | POA: Diagnosis not present

## 2021-04-23 DIAGNOSIS — I480 Paroxysmal atrial fibrillation: Secondary | ICD-10-CM | POA: Insufficient documentation

## 2021-04-23 DIAGNOSIS — Z8673 Personal history of transient ischemic attack (TIA), and cerebral infarction without residual deficits: Secondary | ICD-10-CM | POA: Diagnosis not present

## 2021-04-23 DIAGNOSIS — N183 Chronic kidney disease, stage 3 unspecified: Secondary | ICD-10-CM | POA: Diagnosis not present

## 2021-04-23 DIAGNOSIS — Z7901 Long term (current) use of anticoagulants: Secondary | ICD-10-CM | POA: Insufficient documentation

## 2021-04-23 DIAGNOSIS — E1122 Type 2 diabetes mellitus with diabetic chronic kidney disease: Secondary | ICD-10-CM | POA: Insufficient documentation

## 2021-04-23 DIAGNOSIS — I130 Hypertensive heart and chronic kidney disease with heart failure and stage 1 through stage 4 chronic kidney disease, or unspecified chronic kidney disease: Secondary | ICD-10-CM | POA: Diagnosis not present

## 2021-04-23 DIAGNOSIS — Z79899 Other long term (current) drug therapy: Secondary | ICD-10-CM | POA: Insufficient documentation

## 2021-04-23 HISTORY — DX: Other thrombophilia: D68.69

## 2021-04-23 MED ORDER — APIXABAN 2.5 MG PO TABS: 2.5000 mg | ORAL_TABLET | Freq: Two times a day (BID) | ORAL | 1 refills | Status: DC

## 2021-04-23 NOTE — Progress Notes (Signed)
? ? ?Primary Care Physician: Street, Sharon Mt, MD ?Primary Cardiologist: Dr Agustin Cree  ?Primary Electrophysiologist: Dr Curt Bears ?Referring Physician: Dr Curt Bears  ? ? ?Amanda [name] is a 81 y.o. female with a history of CVA, CKD, DM, HTN, OSA, s/p Watchman implant, CHB s/p PPM, HFrEF, CAD, atrial fibrillation who presents for consultation in the [facility]. Patient has a CHADS2VASC score of 9. She is on amiodarone and underwent DCCV on 09/06/20. She was hospitalized 03/2021 for a fall with a closed left hip fracture. She had ORIF on 03/25/21. Patient reports that she noted an elevated heart rate and fatigue starting 04/17/21. Device transmission persistent afib starting at that time. She had recently been treated for an URI and had been on antibiotics.  ? ?Today, she denies symptoms of chest pain, shortness of breath, orthopnea, PND, lower extremity edema, dizziness, presyncope, syncope, snoring, daytime somnolence, bleeding, or neurologic sequela. The patient is tolerating medications without difficulties and is otherwise without complaint today.  ? ? ?Atrial Fibrillation Risk Factors: ? ?she does have symptoms or diagnosis of sleep apnea. ?she is compliant with CPAP therapy. ?she does not have a history of rheumatic fever. ? ? ?she has a BMI of Body mass index is 33.2 kg/m?Marland KitchenMarland Kitchen ?Filed Weights  ? 04/23/21 1347  ?Weight: 85 kg  ? ? ?Family History  ?Problem Relation Age of Onset  ? Breast cancer Mother   ? Hypertension Father   ? Prostate cancer Father   ? Stroke Father   ? Diabetes Sister   ? Heart attack Sister   ? Heart attack Brother   ? Heart attack Maternal Uncle   ? Cancer Maternal Grandfather   ?     not sure if it is colon or rectal   ? Esophageal cancer Neg Hx   ? ? ? ?Atrial Fibrillation Management history: ? ?Previous antiarrhythmic drugs: amiodarone  ?Previous cardioversions: 05/16/20, 09/06/20 ?Previous ablations: none ?CHADS2VASC score: 9 ?Anticoagulation history: Eliquis,  Watchman ? ? ?Past Medical History:  ?Diagnosis Date  ? Acute ischemic stroke ([place])   ? Acute metabolic encephalopathy   ? Acute on chronic systolic (congestive) heart failure ([place])   ? Acute renal insufficiency 09/02/2016  ? Anemia due to stage 4 chronic kidney disease ([place]) 04/04/2015  ? Atrial fibrillation ([place])   ? Benign hypertension with CKD (chronic kidney disease) stage IV ([place]) 04/04/2015  ? Bradycardia 09/17/2014  ? Chest pain 06/23/2016  ? Overview:  Added automatically from request for surgery [id number]  ? Chronic anemia   ? Chronic kidney disease (CKD), stage III (moderate) (HCC)   ? Coronary artery disease 10/12/2016  ? Non drug-eluting stent implanted in June 2018 to mid RCA   08/08/2016 10:37  Angiographic Findings  Cardiac Arteries and Lesion Findings LMCA: 0% and Normal. LAD: 0% and Normal. RCA:   Lesion on Mid RCA: Mid subsection.95% stenosis reduced to 0%. Pre procedure   TIMI II flow was noted. Post Procedure TIMI III flow was present. Poor run   off was present. The lesion was diagnosed as High Risk (C).   ? 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 ([place]) 04/04/2015  ? Diabetes mellitus without complication ([place])   ? Diverticulosis   ? Dyslipidemia 09/17/2014  ? Gastroesophageal reflux   ? GI bleed 08/06/2016  ? Heart block AV complete ([place]) 09/05/2019  ? History of cardiac monitoring 02/23/2018  ? monitor inserted  ? Hyperlipidemia   ? Hypertensive heart disease with heart failure (  [place])   ? Iron deficiency anemia 12/28/2017  ? LV dysfunction 09/02/2016  ? Myocardial infarction [facility])   ? NSTEMI (non-ST elevated myocardial infarction) ([place]) 08/06/2016  ? Obstructive sleep apnea 09/17/2014  ? Orthostatic hypotension 12/28/2017  ? OSA on CPAP   ? 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  ? Postural dizziness with presyncope 09/23/2017  ? 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/24/2017  ? 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 Renal failure, chronic, stage 3 (moderate) (HCC)   ?  Syncope 11/04/2017  ? Systolic heart failure ([place])   ? Thyroid disease   ? Vitamin D deficiency 04/04/2015  ? ?Past Surgical History:  ?Procedure Laterality Date  ? BUBBLE STUDY  05/16/2020  ? Procedure: BUBBLE STUDY;  Surgeon: Elouise Munroe, MD;  Location: [place];  Service: Cardiovascular;;  ? CARDIAC CATHETERIZATION    ? CARDIOVERSION N/A 05/16/2020  ? Procedure: CARDIOVERSION;  Surgeon: Elouise Munroe, MD;  Location: [place];  Service: Cardiovascular;  Laterality: N/A;  ? CARDIOVERSION N/A 09/06/2020  ? Procedure: CARDIOVERSION;  Surgeon: Donato Heinz, MD;  Location: [place];  Service: Cardiovascular;  Laterality: N/A;  ? COLONOSCOPY  11/21/2014  ? Moderate predominantly sigmoid diverticulosis. Otherwise noraml collonscopy to TI.   ? CORONARY ANGIOPLASTY WITH STENT PLACEMENT  08/2016  ? EXCISIONAL HEMORRHOIDECTOMY    ? LEFT ATRIAL APPENDAGE OCCLUSION  11/2016  ? in [place]  ? LOOP RECORDER INSERTION N/A 02/23/2018  ? Procedure: LOOP RECORDER INSERTION;  Surgeon: Constance Haw, MD;  Location: [place] [facility];  Service: Cardiovascular;  Laterality: N/A;  ? LOOP RECORDER REMOVAL N/A 04/24/2019  ? Procedure: LOOP RECORDER REMOVAL;  Surgeon: Constance Haw, MD;  Location: [place] [facility];  Service: Cardiovascular;  Laterality: N/A;  ? NOSE SURGERY    ? PACEMAKER IMPLANT N/A 04/24/2019  ? Procedure: PACEMAKER IMPLANT;  Surgeon: Constance Haw, MD;  Location: [place] [facility];  Service: Cardiovascular;  Laterality: N/A;  ? TEE WITHOUT CARDIOVERSION N/A 05/16/2020  ? Procedure: TRANSESOPHAGEAL ECHOCARDIOGRAM (TEE);  Surgeon: Elouise Munroe, MD;  Location: [facility] ENDOSCOPY;  Service: Cardiovascular;  Laterality: N/A;  ? TOTAL HIP REVISION Left 03/25/2021  ? Procedure: LEFT POSTERIOR TOTAL HIP  ZIMMER CABLES;  Surgeon: Paralee Cancel, MD;  Location: [facility];  Service: Orthopedics;  Laterality: Left;  ? ? ?Current Outpatient Medications  ?Medication Sig Dispense Refill  ?  acidophilus (RISAQUAD) CAPS capsule Take 1 capsule by mouth daily at 12 noon. 20 capsule 0  ? albuterol (VENTOLIN HFA) 108 (90 Base) MCG/ACT inhaler Inhale into the lungs as needed for wheezing or shortness of breath.    ? amiodarone (PACERONE) 200 MG tablet TAKE 1 TABLET BY MOUTH DAILY. (Patient taking differently: Take 200 mg by mouth daily.) 90 tablet 2  ? aspirin 81 MG chewable tablet Chew 1 tablet (81 mg total) by mouth 2 (two) times daily for 28 days. 56 tablet 0  ? BIOTIN PO Take 600 mcg by mouth 2 (two) times daily.    ? blood glucose meter kit and supplies KIT Dispense based on patient and insurance preference. Use up to four times daily as directed. 1 each 0  ? carboxymethylcellul-glycerin (REFRESH RELIEVA) 0.5-0.9 % ophthalmic solution Place 1 drop into both eyes daily as needed for dry eyes.    ? Cholecalciferol (VITAMIN D3) 50 MCG (2000 UT) TABS Take 2,000 Units by mouth daily at 12 noon.    ? Coenzyme Q10 100 MG TABS Take 100  mg by mouth daily at 12 noon.     ? colchicine 0.6 MG tablet Take 0.6 mg by mouth as needed. For gout flare ups    ? denosumab (PROLIA) 60 MG/ML SOSY injection Inject 60 mg into the skin every 6 (six) months.    ? estradiol (ESTRACE) 0.1 MG/GM vaginal cream Place 1 Applicatorful vaginally every Monday, Wednesday, and Friday.     ? Evolocumab (REPATHA) 140 MG/ML SOSY Inject 140 mg into the skin every 14 (fourteen) days. Every other Thursday    ? ezetimibe (ZETIA) 10 MG tablet TAKE 1 TABLET BY MOUTH EVERY EVENING. (Patient taking differently: Take 10 mg by mouth daily.) 90 tablet 2  ? famotidine (PEPCID) 40 MG tablet Take 40 mg by mouth daily.    ? febuxostat (ULORIC) 40 MG tablet Take 40 mg by mouth at bedtime.    ? feeding supplement, GLUCERNA SHAKE, (GLUCERNA SHAKE) LIQD Take 237 mLs by mouth 3 (three) times daily between meals. 237 mL 0  ? ferrous sulfate 325 (65 FE) MG EC tablet Take 325 mg by mouth 2 (two) times daily with breakfast and lunch.    ? folic acid (FOLVITE) 599 MCG  tablet Take 800 mcg by mouth daily at 12 noon.    ? furosemide (LASIX) 20 MG tablet Take 40 mg by mouth 2 (two) times daily with breakfast and lunch.    ? Glucosamine-Chondroitin (OSTEO BI-FLEX REGULAR

## 2021-04-23 NOTE — H&P (View-Only) (Signed)
? ? ?Primary Care Physician: Street, Sharon Mt, MD ?Primary Cardiologist: Dr Agustin Cree  ?Primary Electrophysiologist: Dr Curt Bears ?Referring Physician: Dr Curt Bears  ? ? ?Amanda [name] is a 81 y.o. female with a history of CVA, CKD, DM, HTN, OSA, s/p Watchman implant, CHB s/p PPM, HFrEF, CAD, atrial fibrillation who presents for consultation in the [facility]. Patient has a CHADS2VASC score of 9. She is on amiodarone and underwent DCCV on 09/06/20. She was hospitalized 03/2021 for a fall with a closed left hip fracture. She had ORIF on 03/25/21. Patient reports that she noted an elevated heart rate and fatigue starting 04/17/21. Device transmission persistent afib starting at that time. She had recently been treated for an URI and had been on antibiotics.  ? ?Today, she denies symptoms of chest pain, shortness of breath, orthopnea, PND, lower extremity edema, dizziness, presyncope, syncope, snoring, daytime somnolence, bleeding, or neurologic sequela. The patient is tolerating medications without difficulties and is otherwise without complaint today.  ? ? ?Atrial Fibrillation Risk Factors: ? ?she does have symptoms or diagnosis of sleep apnea. ?she is compliant with CPAP therapy. ?she does not have a history of rheumatic fever. ? ? ?she has a BMI of Body mass index is 33.2 kg/m?Marland KitchenMarland Kitchen ?Filed Weights  ? 04/23/21 1347  ?Weight: 85 kg  ? ? ?Family History  ?Problem Relation Age of Onset  ? Breast cancer Mother   ? Hypertension Father   ? Prostate cancer Father   ? Stroke Father   ? Diabetes Sister   ? Heart attack Sister   ? Heart attack Brother   ? Heart attack Maternal Uncle   ? Cancer Maternal Grandfather   ?     not sure if it is colon or rectal   ? Esophageal cancer Neg Hx   ? ? ? ?Atrial Fibrillation Management history: ? ?Previous antiarrhythmic drugs: amiodarone  ?Previous cardioversions: 05/16/20, 09/06/20 ?Previous ablations: none ?CHADS2VASC score: 9 ?Anticoagulation history: Eliquis,  Watchman ? ? ?Past Medical History:  ?Diagnosis Date  ? Acute ischemic stroke ([place])   ? Acute metabolic encephalopathy   ? Acute on chronic systolic (congestive) heart failure ([place])   ? Acute renal insufficiency 09/02/2016  ? Anemia due to stage 4 chronic kidney disease ([place]) 04/04/2015  ? Atrial fibrillation ([place])   ? Benign hypertension with CKD (chronic kidney disease) stage IV ([place]) 04/04/2015  ? Bradycardia 09/17/2014  ? Chest pain 06/23/2016  ? Overview:  Added automatically from request for surgery [id number]  ? Chronic anemia   ? Chronic kidney disease (CKD), stage III (moderate) (HCC)   ? Coronary artery disease 10/12/2016  ? Non drug-eluting stent implanted in June 2018 to mid RCA   08/08/2016 10:37  Angiographic Findings  Cardiac Arteries and Lesion Findings LMCA: 0% and Normal. LAD: 0% and Normal. RCA:   Lesion on Mid RCA: Mid subsection.95% stenosis reduced to 0%. Pre procedure   TIMI II flow was noted. Post Procedure TIMI III flow was present. Poor run   off was present. The lesion was diagnosed as High Risk (C).   ? 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 ([place]) 04/04/2015  ? Diabetes mellitus without complication ([place])   ? Diverticulosis   ? Dyslipidemia 09/17/2014  ? Gastroesophageal reflux   ? GI bleed 08/06/2016  ? Heart block AV complete ([place]) 09/05/2019  ? History of cardiac monitoring 02/23/2018  ? monitor inserted  ? Hyperlipidemia   ? Hypertensive heart disease with heart failure (  [place])   ? Iron deficiency anemia 12/28/2017  ? LV dysfunction 09/02/2016  ? Myocardial infarction [facility])   ? NSTEMI (non-ST elevated myocardial infarction) ([place]) 08/06/2016  ? Obstructive sleep apnea 09/17/2014  ? Orthostatic hypotension 12/28/2017  ? OSA on CPAP   ? 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  ? Postural dizziness with presyncope 09/23/2017  ? 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/24/2017  ? 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 Renal failure, chronic, stage 3 (moderate) (HCC)   ?  Syncope 11/04/2017  ? Systolic heart failure ([place])   ? Thyroid disease   ? Vitamin D deficiency 04/04/2015  ? ?Past Surgical History:  ?Procedure Laterality Date  ? BUBBLE STUDY  05/16/2020  ? Procedure: BUBBLE STUDY;  Surgeon: Elouise Munroe, MD;  Location: [place];  Service: Cardiovascular;;  ? CARDIAC CATHETERIZATION    ? CARDIOVERSION N/A 05/16/2020  ? Procedure: CARDIOVERSION;  Surgeon: Elouise Munroe, MD;  Location: [place];  Service: Cardiovascular;  Laterality: N/A;  ? CARDIOVERSION N/A 09/06/2020  ? Procedure: CARDIOVERSION;  Surgeon: Donato Heinz, MD;  Location: [place];  Service: Cardiovascular;  Laterality: N/A;  ? COLONOSCOPY  11/21/2014  ? Moderate predominantly sigmoid diverticulosis. Otherwise noraml collonscopy to TI.   ? CORONARY ANGIOPLASTY WITH STENT PLACEMENT  08/2016  ? EXCISIONAL HEMORRHOIDECTOMY    ? LEFT ATRIAL APPENDAGE OCCLUSION  11/2016  ? in [place]  ? LOOP RECORDER INSERTION N/A 02/23/2018  ? Procedure: LOOP RECORDER INSERTION;  Surgeon: Constance Haw, MD;  Location: [place] [facility];  Service: Cardiovascular;  Laterality: N/A;  ? LOOP RECORDER REMOVAL N/A 04/24/2019  ? Procedure: LOOP RECORDER REMOVAL;  Surgeon: Constance Haw, MD;  Location: [place] [facility];  Service: Cardiovascular;  Laterality: N/A;  ? NOSE SURGERY    ? PACEMAKER IMPLANT N/A 04/24/2019  ? Procedure: PACEMAKER IMPLANT;  Surgeon: Constance Haw, MD;  Location: [place] [facility];  Service: Cardiovascular;  Laterality: N/A;  ? TEE WITHOUT CARDIOVERSION N/A 05/16/2020  ? Procedure: TRANSESOPHAGEAL ECHOCARDIOGRAM (TEE);  Surgeon: Elouise Munroe, MD;  Location: [facility] ENDOSCOPY;  Service: Cardiovascular;  Laterality: N/A;  ? TOTAL HIP REVISION Left 03/25/2021  ? Procedure: LEFT POSTERIOR TOTAL HIP  ZIMMER CABLES;  Surgeon: Paralee Cancel, MD;  Location: [facility];  Service: Orthopedics;  Laterality: Left;  ? ? ?Current Outpatient Medications  ?Medication Sig Dispense Refill  ?  acidophilus (RISAQUAD) CAPS capsule Take 1 capsule by mouth daily at 12 noon. 20 capsule 0  ? albuterol (VENTOLIN HFA) 108 (90 Base) MCG/ACT inhaler Inhale into the lungs as needed for wheezing or shortness of breath.    ? amiodarone (PACERONE) 200 MG tablet TAKE 1 TABLET BY MOUTH DAILY. (Patient taking differently: Take 200 mg by mouth daily.) 90 tablet 2  ? aspirin 81 MG chewable tablet Chew 1 tablet (81 mg total) by mouth 2 (two) times daily for 28 days. 56 tablet 0  ? BIOTIN PO Take 600 mcg by mouth 2 (two) times daily.    ? blood glucose meter kit and supplies KIT Dispense based on patient and insurance preference. Use up to four times daily as directed. 1 each 0  ? carboxymethylcellul-glycerin (REFRESH RELIEVA) 0.5-0.9 % ophthalmic solution Place 1 drop into both eyes daily as needed for dry eyes.    ? Cholecalciferol (VITAMIN D3) 50 MCG (2000 UT) TABS Take 2,000 Units by mouth daily at 12 noon.    ? Coenzyme Q10 100 MG TABS Take 100  mg by mouth daily at 12 noon.     ? colchicine 0.6 MG tablet Take 0.6 mg by mouth as needed. For gout flare ups    ? denosumab (PROLIA) 60 MG/ML SOSY injection Inject 60 mg into the skin every 6 (six) months.    ? estradiol (ESTRACE) 0.1 MG/GM vaginal cream Place 1 Applicatorful vaginally every Monday, Wednesday, and Friday.     ? Evolocumab (REPATHA) 140 MG/ML SOSY Inject 140 mg into the skin every 14 (fourteen) days. Every other Thursday    ? ezetimibe (ZETIA) 10 MG tablet TAKE 1 TABLET BY MOUTH EVERY EVENING. (Patient taking differently: Take 10 mg by mouth daily.) 90 tablet 2  ? famotidine (PEPCID) 40 MG tablet Take 40 mg by mouth daily.    ? febuxostat (ULORIC) 40 MG tablet Take 40 mg by mouth at bedtime.    ? feeding supplement, GLUCERNA SHAKE, (GLUCERNA SHAKE) LIQD Take 237 mLs by mouth 3 (three) times daily between meals. 237 mL 0  ? ferrous sulfate 325 (65 FE) MG EC tablet Take 325 mg by mouth 2 (two) times daily with breakfast and lunch.    ? folic acid (FOLVITE) 211 MCG  tablet Take 800 mcg by mouth daily at 12 noon.    ? furosemide (LASIX) 20 MG tablet Take 40 mg by mouth 2 (two) times daily with breakfast and lunch.    ? Glucosamine-Chondroitin (OSTEO BI-FLEX REGULAR

## 2021-04-23 NOTE — Patient Instructions (Signed)
Start Eliquis 2.5mg  twice a day ? ? ?Cardioversion scheduled for Friday, April 14th ? - come to afib clinic for labs at 930am ? - Arrive at the [facility] and go to admitting at 10am ? - Do not eat or drink anything after midnight the night prior to your procedure. ? - Take all your morning medication (except diabetic medications) with a sip of water prior to arrival. ? - You will not be able to drive home after your procedure. ? - Do NOT miss any doses of your blood thinner - if you should miss a dose please notify our office immediately. ? - If you feel as if you go back into normal rhythm prior to scheduled cardioversion, please notify our office immediately. If your procedure is canceled in the cardioversion suite you will be charged a cancellation fee. ? ?

## 2021-04-25 NOTE — Telephone Encounter (Signed)
Patient had appointment with A-fib clinic and was set-up for cardioversion. Dr. Agustin Cree made aware of this and is ok with it. ?

## 2021-04-28 ENCOUNTER — Other Ambulatory Visit: Payer: Self-pay | Admitting: Cardiology

## 2021-04-28 DIAGNOSIS — I130 Hypertensive heart and chronic kidney disease with heart failure and stage 1 through stage 4 chronic kidney disease, or unspecified chronic kidney disease: Secondary | ICD-10-CM | POA: Diagnosis not present

## 2021-04-28 DIAGNOSIS — E1122 Type 2 diabetes mellitus with diabetic chronic kidney disease: Secondary | ICD-10-CM | POA: Diagnosis not present

## 2021-04-28 DIAGNOSIS — D631 Anemia in chronic kidney disease: Secondary | ICD-10-CM | POA: Diagnosis not present

## 2021-04-28 DIAGNOSIS — N184 Chronic kidney disease, stage 4 (severe): Secondary | ICD-10-CM | POA: Diagnosis not present

## 2021-04-29 NOTE — Progress Notes (Signed)
Remote pacemaker transmission.   

## 2021-05-01 ENCOUNTER — Telehealth: Payer: Self-pay | Admitting: Cardiology

## 2021-05-01 ENCOUNTER — Other Ambulatory Visit: Payer: Self-pay | Admitting: Cardiology

## 2021-05-01 ENCOUNTER — Other Ambulatory Visit: Payer: Self-pay

## 2021-05-01 DIAGNOSIS — E079 Disorder of thyroid, unspecified: Secondary | ICD-10-CM | POA: Diagnosis not present

## 2021-05-01 DIAGNOSIS — E1122 Type 2 diabetes mellitus with diabetic chronic kidney disease: Secondary | ICD-10-CM | POA: Diagnosis not present

## 2021-05-01 DIAGNOSIS — K5790 Diverticulosis of intestine, part unspecified, without perforation or abscess without bleeding: Secondary | ICD-10-CM | POA: Diagnosis not present

## 2021-05-01 DIAGNOSIS — N184 Chronic kidney disease, stage 4 (severe): Secondary | ICD-10-CM | POA: Diagnosis not present

## 2021-05-01 DIAGNOSIS — I252 Old myocardial infarction: Secondary | ICD-10-CM | POA: Diagnosis not present

## 2021-05-01 DIAGNOSIS — K219 Gastro-esophageal reflux disease without esophagitis: Secondary | ICD-10-CM | POA: Diagnosis not present

## 2021-05-01 DIAGNOSIS — I442 Atrioventricular block, complete: Secondary | ICD-10-CM | POA: Diagnosis not present

## 2021-05-01 DIAGNOSIS — D631 Anemia in chronic kidney disease: Secondary | ICD-10-CM | POA: Diagnosis not present

## 2021-05-01 DIAGNOSIS — I5022 Chronic systolic (congestive) heart failure: Secondary | ICD-10-CM | POA: Diagnosis not present

## 2021-05-01 DIAGNOSIS — G4733 Obstructive sleep apnea (adult) (pediatric): Secondary | ICD-10-CM | POA: Diagnosis not present

## 2021-05-01 DIAGNOSIS — D509 Iron deficiency anemia, unspecified: Secondary | ICD-10-CM | POA: Diagnosis not present

## 2021-05-01 DIAGNOSIS — S72145D Nondisplaced intertrochanteric fracture of left femur, subsequent encounter for closed fracture with routine healing: Secondary | ICD-10-CM | POA: Diagnosis not present

## 2021-05-01 DIAGNOSIS — I130 Hypertensive heart and chronic kidney disease with heart failure and stage 1 through stage 4 chronic kidney disease, or unspecified chronic kidney disease: Secondary | ICD-10-CM | POA: Diagnosis not present

## 2021-05-01 DIAGNOSIS — I2510 Atherosclerotic heart disease of native coronary artery without angina pectoris: Secondary | ICD-10-CM | POA: Diagnosis not present

## 2021-05-01 DIAGNOSIS — E785 Hyperlipidemia, unspecified: Secondary | ICD-10-CM | POA: Diagnosis not present

## 2021-05-01 DIAGNOSIS — I4891 Unspecified atrial fibrillation: Secondary | ICD-10-CM | POA: Diagnosis not present

## 2021-05-01 MED ORDER — RANOLAZINE ER 500 MG PO TB12: 500.0000 mg | ORAL_TABLET | Freq: Two times a day (BID) | ORAL | 3 refills | Status: DC

## 2021-05-01 NOTE — Telephone Encounter (Signed)
Called patient and informed her that her prescription was refilled and sent to pharmacy. Patient was appreciative and had no further questions. ?

## 2021-05-01 NOTE — Telephone Encounter (Signed)
?*  STAT* If patient is at the pharmacy, call can be transferred to refill team. ? ? ?1. Which medications need to be refilled? (please list name of each medication and dose if known) ranolazine (RANEXA) 500 MG 12 hr tablet ? ?2. Which pharmacy/location (including street and city if local pharmacy) is medication to be sent to? ?[place], [place] ? ?3. Do they need a 30 day or 90 day supply? 30 ds ? ?

## 2021-05-05 ENCOUNTER — Telehealth: Payer: Self-pay | Admitting: Cardiology

## 2021-05-05 ENCOUNTER — Other Ambulatory Visit: Payer: Self-pay | Admitting: Cardiology

## 2021-05-05 MED ORDER — HYDRALAZINE HCL 50 MG PO TABS: 50.0000 mg | ORAL_TABLET | Freq: Four times a day (QID) | ORAL | 2 refills | Status: DC

## 2021-05-05 NOTE — Telephone Encounter (Signed)
Recommendations reviewed with pt as per Dr. Krasowski's note.  Pt verbalized understanding and had no additional questions.  

## 2021-05-05 NOTE — Addendum Note (Signed)
Addended by: Truddie Hidden on: 05/05/2021 12:11 PM ? ? Modules accepted: Orders ? ?

## 2021-05-05 NOTE — Telephone Encounter (Signed)
Pt states that her BP has been normal since this medication change and she would like to know if you would like her to continue. Please advise. ?

## 2021-05-05 NOTE — Telephone Encounter (Signed)
Pt c/o medication issue: ? ?1. Name of Medication: hydrALAZINE (APRESOLINE) 50 MG tablet ? ?2. How are you currently taking this medication (dosage and times per day)? As directed ? ?3. Are you having a reaction (difficulty breathing--STAT)? no ? ?4. What is your medication issue? Patient was given this medication while she was in the hospital at [facility] for a hip surgery. She wanted to know if Dr. Agustin Cree wanted her to stay on this medication or not. Please advise  ?

## 2021-05-07 DIAGNOSIS — Z4789 Encounter for other orthopedic aftercare: Secondary | ICD-10-CM | POA: Diagnosis not present

## 2021-05-08 ENCOUNTER — Encounter (HOSPITAL_COMMUNITY): Payer: Self-pay | Admitting: Cardiology

## 2021-05-13 DIAGNOSIS — N3000 Acute cystitis without hematuria: Secondary | ICD-10-CM | POA: Diagnosis not present

## 2021-05-13 DIAGNOSIS — L89611 Pressure ulcer of right heel, stage 1: Secondary | ICD-10-CM | POA: Diagnosis not present

## 2021-05-13 DIAGNOSIS — N184 Chronic kidney disease, stage 4 (severe): Secondary | ICD-10-CM | POA: Diagnosis not present

## 2021-05-13 DIAGNOSIS — L89621 Pressure ulcer of left heel, stage 1: Secondary | ICD-10-CM | POA: Diagnosis not present

## 2021-05-16 ENCOUNTER — Encounter (HOSPITAL_COMMUNITY)
Admission: RE | Disposition: A | Discharge status: Home / Self Care | Payer: Self-pay | Source: Home / Self Care | Attending: Cardiology

## 2021-05-16 ENCOUNTER — Ambulatory Visit (HOSPITAL_COMMUNITY): Payer: Medicare Other | Admitting: Anesthesiology

## 2021-05-16 ENCOUNTER — Other Ambulatory Visit: Payer: Self-pay

## 2021-05-16 ENCOUNTER — Ambulatory Visit (HOSPITAL_COMMUNITY)
Admission: RE | Admit: 2021-05-16 | Discharge: 2021-05-16 | Disposition: A | Discharge status: Home / Self Care | Payer: Medicare Other | Source: Ambulatory Visit | Attending: Nurse Practitioner | Admitting: Nurse Practitioner

## 2021-05-16 ENCOUNTER — Ambulatory Visit (HOSPITAL_BASED_OUTPATIENT_CLINIC_OR_DEPARTMENT_OTHER): Payer: Medicare Other | Admitting: Anesthesiology

## 2021-05-16 ENCOUNTER — Encounter (HOSPITAL_COMMUNITY): Payer: Self-pay | Admitting: Cardiology

## 2021-05-16 ENCOUNTER — Ambulatory Visit (HOSPITAL_COMMUNITY)
Admission: RE | Admit: 2021-05-16 | Discharge: 2021-05-16 | Disposition: A | Discharge status: Home / Self Care | Payer: Medicare Other | Attending: Cardiology | Admitting: Cardiology

## 2021-05-16 DIAGNOSIS — G4733 Obstructive sleep apnea (adult) (pediatric): Secondary | ICD-10-CM | POA: Insufficient documentation

## 2021-05-16 DIAGNOSIS — I130 Hypertensive heart and chronic kidney disease with heart failure and stage 1 through stage 4 chronic kidney disease, or unspecified chronic kidney disease: Secondary | ICD-10-CM | POA: Insufficient documentation

## 2021-05-16 DIAGNOSIS — Z6832 Body mass index (BMI) 32.0-32.9, adult: Secondary | ICD-10-CM | POA: Diagnosis not present

## 2021-05-16 DIAGNOSIS — Z79899 Other long term (current) drug therapy: Secondary | ICD-10-CM | POA: Insufficient documentation

## 2021-05-16 DIAGNOSIS — Z794 Long term (current) use of insulin: Secondary | ICD-10-CM | POA: Diagnosis not present

## 2021-05-16 DIAGNOSIS — I129 Hypertensive chronic kidney disease with stage 1 through stage 4 chronic kidney disease, or unspecified chronic kidney disease: Secondary | ICD-10-CM | POA: Diagnosis not present

## 2021-05-16 DIAGNOSIS — I2510 Atherosclerotic heart disease of native coronary artery without angina pectoris: Secondary | ICD-10-CM

## 2021-05-16 DIAGNOSIS — Z87891 Personal history of nicotine dependence: Secondary | ICD-10-CM | POA: Insufficient documentation

## 2021-05-16 DIAGNOSIS — E669 Obesity, unspecified: Secondary | ICD-10-CM | POA: Insufficient documentation

## 2021-05-16 DIAGNOSIS — D6869 Other thrombophilia: Secondary | ICD-10-CM | POA: Diagnosis not present

## 2021-05-16 DIAGNOSIS — I252 Old myocardial infarction: Secondary | ICD-10-CM | POA: Diagnosis not present

## 2021-05-16 DIAGNOSIS — N189 Chronic kidney disease, unspecified: Secondary | ICD-10-CM

## 2021-05-16 DIAGNOSIS — I4819 Other persistent atrial fibrillation: Secondary | ICD-10-CM | POA: Diagnosis not present

## 2021-05-16 DIAGNOSIS — E1122 Type 2 diabetes mellitus with diabetic chronic kidney disease: Secondary | ICD-10-CM | POA: Insufficient documentation

## 2021-05-16 DIAGNOSIS — N183 Chronic kidney disease, stage 3 unspecified: Secondary | ICD-10-CM | POA: Diagnosis not present

## 2021-05-16 DIAGNOSIS — Z950 Presence of cardiac pacemaker: Secondary | ICD-10-CM | POA: Diagnosis not present

## 2021-05-16 DIAGNOSIS — I4891 Unspecified atrial fibrillation: Secondary | ICD-10-CM | POA: Diagnosis not present

## 2021-05-16 DIAGNOSIS — I5020 Unspecified systolic (congestive) heart failure: Secondary | ICD-10-CM | POA: Diagnosis not present

## 2021-05-16 HISTORY — PX: CARDIOVERSION: SHX1299

## 2021-05-16 LAB — CBC
HCT: 30.6 % — ABNORMAL LOW (ref 36.0–46.0)
Hemoglobin: 8.9 g/dL — ABNORMAL LOW (ref 12.0–15.0)
MCH: 28.9 pg (ref 26.0–34.0)
MCHC: 29.1 g/dL — ABNORMAL LOW (ref 30.0–36.0)
MCV: 99.4 fL (ref 80.0–100.0)
Platelets: 270 10*3/uL (ref 150–400)
RBC: 3.08 MIL/uL — ABNORMAL LOW (ref 3.87–5.11)
RDW: 16.1 % — ABNORMAL HIGH (ref 11.5–15.5)
WBC: 6.3 10*3/uL (ref 4.0–10.5)
nRBC: 0 % (ref 0.0–0.2)

## 2021-05-16 LAB — BASIC METABOLIC PANEL
Anion gap: 7 (ref 5–15)
BUN: 46 mg/dL — ABNORMAL HIGH (ref 8–23)
CO2: 21 mmol/L — ABNORMAL LOW (ref 22–32)
Calcium: 9.6 mg/dL (ref 8.9–10.3)
Chloride: 112 mmol/L — ABNORMAL HIGH (ref 98–111)
Creatinine, Ser: 1.93 mg/dL — ABNORMAL HIGH (ref 0.44–1.00)
GFR, Estimated: 26 mL/min — ABNORMAL LOW (ref >60)
Glucose, Bld: 143 mg/dL — ABNORMAL HIGH (ref 70–99)
Potassium: 4.5 mmol/L (ref 3.5–5.1)
Sodium: 140 mmol/L (ref 135–145)

## 2021-05-16 SURGERY — CARDIOVERSION
Anesthesia: General

## 2021-05-16 MED ORDER — PROPOFOL 10 MG/ML IV BOLUS
INTRAVENOUS | Status: DC | PRN
  Administered 2021-05-16: 50 mg via INTRAVENOUS

## 2021-05-16 MED ORDER — LIDOCAINE 2% (20 MG/ML) 5 ML SYRINGE
INTRAMUSCULAR | Status: DC | PRN
  Administered 2021-05-16: 60 mg via INTRAVENOUS

## 2021-05-16 MED ORDER — SODIUM CHLORIDE 0.9 % IV SOLN: INTRAVENOUS | Status: DC

## 2021-05-16 NOTE — CV Procedure (Signed)
? ?  Electrical Cardioversion Procedure Note ?Amanda [name] ?[id number] ?Sep 09, 1940 ? ?Procedure: Electrical Cardioversion ?Indications:  Atrial Fibrillation ? ?Time Out: Verified patient identification, verified procedure,medications/allergies/relevent history reviewed, required imaging and test results available.  Performed ? ?Procedure Details ? ?The patient was NPO after midnight. Anesthesia was administered at the beside  by Dr.Finucane with 60mg  of Lidocaine and 50mg  propofol.  Cardioversion was done with synchronized biphasic defibrillation with AP pads with 150watts.  The patient converted to normal sinus rhythm. The patient tolerated the procedure well  ? ?IMPRESSION: ? ?Successful cardioversion of atrial fibrillation ? ? ? ?Amanda [name] ?05/16/2021, 10:38 AM ?  ?

## 2021-05-16 NOTE — Discharge Instructions (Signed)

## 2021-05-16 NOTE — Interval H&P Note (Signed)
History and Physical Interval Note: ? ?05/16/2021 ?10:38 AM ? ?Amanda [name]  has presented today for surgery, with the diagnosis of AFIB.  The various methods of treatment have been discussed with the patient and family. After consideration of risks, benefits and other options for treatment, the patient has consented to  Procedure(s): ?CARDIOVERSION (N/A) as a surgical intervention.  The patient's history has been reviewed, patient examined, no change in status, stable for surgery.  I have reviewed the patient's chart and labs.  Questions were answered to the patient's satisfaction.   ? ? ?[name] ? ? ?

## 2021-05-16 NOTE — Transfer of Care (Signed)
Immediate Anesthesia Transfer of Care Note ? ?Patient: Amanda [name] ? ?Procedure(s) Performed: CARDIOVERSION ? ?Patient Location: Endoscopy Unit ? ?Anesthesia Type:General ? ?Level of Consciousness: drowsy ? ?Airway & Oxygen Therapy: Patient Spontanous Breathing ? ?Post-op Assessment: Report given to RN and Post -op Vital signs reviewed and stable ? ?Post vital signs: Reviewed and stable ? ?Last Vitals:  ?Vitals Value Taken Time  ?BP    ?Temp    ?Pulse    ?Resp    ?SpO2    ? ? ?Last Pain:  ?Vitals:  ? 05/16/21 1006  ?TempSrc: Temporal  ?PainSc: 0-No pain  ?   ? ?  ? ?Complications: No notable events documented. ?

## 2021-05-16 NOTE — Anesthesia Postprocedure Evaluation (Signed)
Anesthesia Post Note ? ?Patient: Amanda [name] ? ?Procedure(s) Performed: CARDIOVERSION ? ?  ? ?Patient location during evaluation: PACU ?Anesthesia Type: General ?Level of consciousness: awake and alert, oriented and patient cooperative ?Pain management: pain level controlled ?Vital Signs Assessment: post-procedure vital signs reviewed and stable ?Respiratory status: spontaneous breathing, nonlabored ventilation and respiratory function stable ?Cardiovascular status: blood pressure returned to baseline and stable ?Postop Assessment: no apparent nausea or vomiting ?Anesthetic complications: no ? ? ?No notable events documented. ? ?Last Vitals:  ?Vitals:  ? 05/16/21 1105 05/16/21 1115  ?BP: (!) 101/50 (!) 121/45  ?Pulse: 65 (!) 51  ?Resp: 16 15  ?Temp:    ?SpO2: 96% 98%  ?  ?Last Pain:  ?Vitals:  ? 05/16/21 1059  ?TempSrc: Temporal  ?PainSc: 0-No pain  ? ? ?  ?  ?  ?  ?  ?  ? ?Jarome Matin Jasn Xia ? ? ? ? ?

## 2021-05-16 NOTE — Anesthesia Preprocedure Evaluation (Addendum)
Anesthesia Evaluation  ?Patient identified by MRN, date of birth, ID band ?Patient awake ? ? ? ?Reviewed: ?Allergy & Precautions, NPO status , Patient's Chart, lab work & pertinent test results ? ?Airway ?Mallampati: III ? ?TM Distance: >3 FB ?Neck ROM: Full ? ? ? Dental ? ?(+) Teeth Intact, Dental Advisory Given ?  ?Pulmonary ?sleep apnea (noncompliant w/ cpap) , former smoker,  ?  ?Pulmonary exam normal ?breath sounds clear to auscultation ? ? ? ? ? ? Cardiovascular ?hypertension, Pt. on medications ?pulmonary hypertension (mild pHTN)+ CAD and + Past MI  ?Normal cardiovascular exam+ dysrhythmias Atrial Fibrillation + pacemaker + Valvular Problems/Murmurs (mild MS)  ?Rhythm:Regular Rate:Normal ? ?Echo 2023 ??1. Left ventricular ejection fraction, by estimation, is 60 to 65%. The  ?left ventricle has normal function. The left ventricle has no regional  ?wall motion abnormalities. There is severe concentric left ventricular  ?hypertrophy. Left ventricular diastolic  ??parameters are indeterminate.  ??2. Right ventricular systolic function is mildly reduced. The right  ?ventricular size is normal. There is mildly elevated pulmonary artery  ?systolic pressure.  ??3. Mean gradient 2 mm Hg at 57 BPM. The mitral valve is degenerative.  ?Moderate mitral valve regurgitation. Mild mitral stenosis. Moderate mitral  ?annular calcification.  ??4. The aortic valve is normal in structure. Aortic valve regurgitation is  ?not visualized. Aortic valve sclerosis is present, with no evidence of  ?aortic valve stenosis.  ??5. The inferior vena cava is normal in size with greater than 50%  ?respiratory variability, suggesting right atrial pressure of 3 mmHg.  ? ?S/p watchman 2021 ?  ?Neuro/Psych ?CVA negative psych ROS  ? GI/Hepatic ?Neg liver ROS, GERD  Controlled and Medicated,  ?Endo/Other  ?diabetes, Well Controlled, Type 2, Insulin Dependenta1c 7.3 ? Renal/GU ?CRFRenal diseaseCKD 3  ?negative  genitourinary ?  ?Musculoskeletal ?negative musculoskeletal ROS ?(+)  ? Abdominal ?  ?Peds ? Hematology ?negative hematology ROS ?(+)   ?Anesthesia Other Findings ? ? Reproductive/Obstetrics ?negative OB ROS ? ?  ? ? ? ? ? ? ? ? ? ? ? ? ? ?  ?  ? ? ? ? ? ? ? ?Anesthesia Physical ?Anesthesia Plan ? ?ASA: 3 ? ?Anesthesia Plan: General  ? ?Post-op Pain Management:   ? ?Induction: Intravenous ? ?PONV Risk Score and Plan: TIVA and Treatment may vary due to age or medical condition ? ?Airway Management Planned: Natural Airway and Mask ? ?Additional Equipment: None ? ?Intra-op Plan:  ? ?Post-operative Plan:  ? ?Informed Consent: I have reviewed the patients History and Physical, chart, labs and discussed the procedure including the risks, benefits and alternatives for the proposed anesthesia with the patient or authorized representative who has indicated his/her understanding and acceptance.  ? ? ? ?Dental advisory given ? ?Plan Discussed with: CRNA ? ?Anesthesia Plan Comments:   ? ? ? ? ? ?Anesthesia Quick Evaluation ? ?

## 2021-05-19 ENCOUNTER — Encounter (HOSPITAL_COMMUNITY): Payer: Self-pay | Admitting: Cardiology

## 2021-05-22 ENCOUNTER — Encounter (HOSPITAL_COMMUNITY): Payer: Self-pay | Admitting: Physician Assistant

## 2021-05-22 ENCOUNTER — Ambulatory Visit (HOSPITAL_COMMUNITY)
Admission: RE | Admit: 2021-05-22 | Discharge: 2021-05-22 | Disposition: A | Discharge status: Home / Self Care | Payer: Medicare Other | Source: Ambulatory Visit | Attending: Physician Assistant | Admitting: Physician Assistant

## 2021-05-22 VITALS — BP 134/60 | HR 50 | Ht 63.0 in | Wt 181.2 lb

## 2021-05-22 DIAGNOSIS — I4819 Other persistent atrial fibrillation: Secondary | ICD-10-CM | POA: Diagnosis not present

## 2021-05-22 DIAGNOSIS — I130 Hypertensive heart and chronic kidney disease with heart failure and stage 1 through stage 4 chronic kidney disease, or unspecified chronic kidney disease: Secondary | ICD-10-CM | POA: Insufficient documentation

## 2021-05-22 DIAGNOSIS — Z6832 Body mass index (BMI) 32.0-32.9, adult: Secondary | ICD-10-CM | POA: Insufficient documentation

## 2021-05-22 DIAGNOSIS — D6869 Other thrombophilia: Secondary | ICD-10-CM | POA: Diagnosis not present

## 2021-05-22 DIAGNOSIS — I5022 Chronic systolic (congestive) heart failure: Secondary | ICD-10-CM | POA: Insufficient documentation

## 2021-05-22 DIAGNOSIS — G4733 Obstructive sleep apnea (adult) (pediatric): Secondary | ICD-10-CM | POA: Insufficient documentation

## 2021-05-22 DIAGNOSIS — I509 Heart failure, unspecified: Secondary | ICD-10-CM | POA: Diagnosis not present

## 2021-05-22 DIAGNOSIS — E1122 Type 2 diabetes mellitus with diabetic chronic kidney disease: Secondary | ICD-10-CM | POA: Diagnosis not present

## 2021-05-22 DIAGNOSIS — N189 Chronic kidney disease, unspecified: Secondary | ICD-10-CM | POA: Diagnosis not present

## 2021-05-22 DIAGNOSIS — E669 Obesity, unspecified: Secondary | ICD-10-CM | POA: Insufficient documentation

## 2021-05-22 DIAGNOSIS — Z7901 Long term (current) use of anticoagulants: Secondary | ICD-10-CM | POA: Insufficient documentation

## 2021-05-22 MED ORDER — APIXABAN 2.5 MG PO TABS: 2.5000 mg | ORAL_TABLET | Freq: Two times a day (BID) | ORAL | 1 refills | Status: DC

## 2021-05-22 NOTE — Progress Notes (Signed)
? ? ?Primary Care Physician: Street, Sharon Mt, MD ?Primary Cardiologist: Dr Agustin Cree  ?Primary Electrophysiologist: Dr Curt Bears ?Referring Physician: Dr Curt Bears  ? ? ?Amanda [name] is a 81 y.o. female with a history of CVA, CKD, DM, HTN, OSA, s/p Watchman implant, CHB s/p PPM, HFrEF, CAD, atrial fibrillation who presents for follow up in the [facility]. Patient has a CHADS2VASC score of 9. She is on amiodarone and underwent DCCV on 09/06/20. She was hospitalized 03/2021 for a fall with a closed left hip fracture. She had ORIF on 03/25/21. Patient reports that she noted an elevated heart rate and fatigue starting 04/17/21. Device transmission persistent afib starting at that time. She had recently been treated for an URI and had been on antibiotics.  ? ?On follow up today, patient is s/p DCCV on 05/16/21. Patient reports that she feels better back in SR with less SOB. She denies any bleeding issues on anticoagulation.  ? ?Today, she denies symptoms of palpitations, chest pain, shortness of breath, orthopnea, PND, lower extremity edema, dizziness, presyncope, syncope, snoring, daytime somnolence, bleeding, or neurologic sequela. The patient is tolerating medications without difficulties and is otherwise without complaint today.  ? ? ?Atrial Fibrillation Risk Factors: ? ?she does have symptoms or diagnosis of sleep apnea. ?she is compliant with CPAP therapy. ?she does not have a history of rheumatic fever. ? ? ?she has a BMI of Body mass index is 32.1 kg/m?Marland KitchenMarland Kitchen ?Filed Weights  ? 05/22/21 1402  ?Weight: 82.2 kg  ? ? ? ?Family History  ?Problem Relation Age of Onset  ? Breast cancer Mother   ? Hypertension Father   ? Prostate cancer Father   ? Stroke Father   ? Diabetes Sister   ? Heart attack Sister   ? Heart attack Brother   ? Heart attack Maternal Uncle   ? Cancer Maternal Grandfather   ?     not sure if it is colon or rectal   ? Esophageal cancer Neg Hx   ? ? ? ?Atrial Fibrillation  Management history: ? ?Previous antiarrhythmic drugs: amiodarone  ?Previous cardioversions: 05/16/20, 09/06/20, 05/16/21 ?Previous ablations: none ?CHADS2VASC score: 9 ?Anticoagulation history: Eliquis, Watchman ? ? ?Past Medical History:  ?Diagnosis Date  ? Acute ischemic stroke ([place])   ? Acute metabolic encephalopathy   ? Acute on chronic systolic (congestive) heart failure ([place])   ? Acute renal insufficiency 09/02/2016  ? Anemia due to stage 4 chronic kidney disease ([place]) 04/04/2015  ? Atrial fibrillation ([place])   ? Benign hypertension with CKD (chronic kidney disease) stage IV ([place]) 04/04/2015  ? Bradycardia 09/17/2014  ? Chest pain 06/23/2016  ? Overview:  Added automatically from request for surgery [id number]  ? Chronic anemia   ? Chronic kidney disease (CKD), stage III (moderate) (HCC)   ? Coronary artery disease 10/12/2016  ? Non drug-eluting stent implanted in June 2018 to mid RCA   08/08/2016 10:37  Angiographic Findings  Cardiac Arteries and Lesion Findings LMCA: 0% and Normal. LAD: 0% and Normal. RCA:   Lesion on Mid RCA: Mid subsection.95% stenosis reduced to 0%. Pre procedure   TIMI II flow was noted. Post Procedure TIMI III flow was present. Poor run   off was present. The lesion was diagnosed as High Risk (C).   ? 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 Diabetes mellitus without complication ([place])   ? Diverticulosis   ? Dyslipidemia 09/17/2014  ? Gastroesophageal reflux   ?  GI bleed 08/06/2016  ? Heart block AV complete ([place]) 09/05/2019  ? History of cardiac monitoring 02/23/2018  ? monitor inserted  ? Hyperlipidemia   ? Hypertensive heart disease with heart failure ([place])   ? Iron deficiency anemia 12/28/2017  ? LV dysfunction 09/02/2016  ? Myocardial infarction [facility])   ? NSTEMI (non-ST elevated myocardial infarction) ([place]) 08/06/2016  ? Obstructive sleep apnea 09/17/2014  ? Orthostatic hypotension 12/28/2017  ? OSA on CPAP   ? 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  ? Postural dizziness  with presyncope 09/23/2017  ? 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/24/2017  ? 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 Renal failure, chronic, stage 3 (moderate) (HCC)   ? Syncope 11/04/2017  ? Systolic heart failure ([place])   ? Thyroid disease   ? Vitamin D deficiency 04/04/2015  ? ?Past Surgical History:  ?Procedure Laterality Date  ? BUBBLE STUDY  05/16/2020  ? Procedure: BUBBLE STUDY;  Surgeon: Elouise Munroe, MD;  Location: [place];  Service: Cardiovascular;;  ? CARDIAC CATHETERIZATION    ? CARDIOVERSION N/A 05/16/2020  ? Procedure: CARDIOVERSION;  Surgeon: Elouise Munroe, MD;  Location: [place];  Service: Cardiovascular;  Laterality: N/A;  ? CARDIOVERSION N/A 09/06/2020  ? Procedure: CARDIOVERSION;  Surgeon: Donato Heinz, MD;  Location: [facility] ENDOSCOPY;  Service: Cardiovascular;  Laterality: N/A;  ? CARDIOVERSION N/A 05/16/2021  ? Procedure: CARDIOVERSION;  Surgeon: Sueanne Margarita, MD;  Location: [facility] ENDOSCOPY;  Service: Cardiovascular;  Laterality: N/A;  ? COLONOSCOPY  11/21/2014  ? Moderate predominantly sigmoid diverticulosis. Otherwise noraml collonscopy to TI.   ? CORONARY ANGIOPLASTY WITH STENT PLACEMENT  08/2016  ? EXCISIONAL HEMORRHOIDECTOMY    ? LEFT ATRIAL APPENDAGE OCCLUSION  11/2016  ? in [place]  ? LOOP RECORDER INSERTION N/A 02/23/2018  ? Procedure: LOOP RECORDER INSERTION;  Surgeon: Constance Haw, MD;  Location: [place] CV LAB;  Service: Cardiovascular;  Laterality: N/A;  ? LOOP RECORDER REMOVAL N/A 04/24/2019  ? Procedure: LOOP RECORDER REMOVAL;  Surgeon: Constance Haw, MD;  Location: [place] CV LAB;  Service: Cardiovascular;  Laterality: N/A;  ? NOSE SURGERY    ? PACEMAKER IMPLANT N/A 04/24/2019  ? Procedure: PACEMAKER IMPLANT;  Surgeon: Constance Haw, MD;  Location: [place] CV LAB;  Service: Cardiovascular;  Laterality: N/A;  ? TEE WITHOUT CARDIOVERSION N/A 05/16/2020  ? Procedure: TRANSESOPHAGEAL ECHOCARDIOGRAM (TEE);   Surgeon: Elouise Munroe, MD;  Location: [facility] ENDOSCOPY;  Service: Cardiovascular;  Laterality: N/A;  ? TOTAL HIP REVISION Left 03/25/2021  ? Procedure: LEFT POSTERIOR TOTAL HIP  ZIMMER CABLES;  Surgeon: Paralee Cancel, MD;  Location: [facility] ORS;  Service: Orthopedics;  Laterality: Left;  ? ? ?Current Outpatient Medications  ?Medication Sig Dispense Refill  ? acidophilus (RISAQUAD) CAPS capsule Take 1 capsule by mouth daily at 12 noon. 20 capsule 0  ? albuterol (VENTOLIN HFA) 108 (90 Base) MCG/ACT inhaler Inhale 2 puffs into the lungs every 6 (six) hours as needed for wheezing or shortness of breath.    ? amiodarone (PACERONE) 200 MG tablet TAKE 1 TABLET BY MOUTH DAILY. 90 tablet 2  ? BIOTIN PO Take 1,000 mcg by mouth 2 (two) times daily.    ? blood glucose meter kit and supplies KIT Dispense based on patient and insurance preference. Use up to four times daily as directed. 1 each 0  ? carboxymethylcellul-glycerin (REFRESH RELIEVA) 0.5-0.9 % ophthalmic solution Place 1 drop into both eyes daily as needed for dry eyes. VE    ?  Cholecalciferol (VITAMIN D3) 50 MCG (2000 UT) TABS Take 2,000 Units by mouth daily at 12 noon.    ? Coenzyme Q10 100 MG TABS Take 100 mg by mouth daily at 12 noon.     ? colchicine 0.6 MG tablet Take 0.6 mg by mouth daily as needed. For gout flare ups    ? denosumab (PROLIA) 60 MG/ML SOSY injection Inject 60 mg into the skin every 6 (six) months.    ? docusate sodium (COLACE) 100 MG capsule Take 100 mg by mouth 2 (two) times daily.    ? estradiol (ESTRACE) 0.1 MG/GM vaginal cream Place 1 Applicatorful vaginally every Monday, Wednesday, and Friday.     ? Evolocumab (REPATHA) 140 MG/ML SOSY Inject 140 mg into the skin every 14 (fourteen) days. Every other Thursday    ? ezetimibe (ZETIA) 10 MG tablet Take 1 tablet (10 mg total) by mouth every evening. 90 tablet 2  ? famotidine (PEPCID) 40 MG tablet Take 40 mg by mouth daily.    ? febuxostat (ULORIC) 40 MG tablet Take 40 mg by mouth at bedtime.    ?  feeding supplement, GLUCERNA SHAKE, (GLUCERNA SHAKE) LIQD Take 237 mLs by mouth 3 (three) times daily between meals. (Patient taking differently: Take 237 mLs by mouth daily.) 237 mL 0  ? ferrous sulfate 325

## 2021-05-22 NOTE — Patient Instructions (Signed)
Stop Eliquis on 06/13/21 ?

## 2021-05-29 ENCOUNTER — Other Ambulatory Visit: Payer: Self-pay | Admitting: Cardiology

## 2021-05-29 NOTE — Telephone Encounter (Signed)
This is Dr. Krasowski's pt 

## 2021-06-02 DIAGNOSIS — G4733 Obstructive sleep apnea (adult) (pediatric): Secondary | ICD-10-CM | POA: Diagnosis not present

## 2021-06-02 DIAGNOSIS — K219 Gastro-esophageal reflux disease without esophagitis: Secondary | ICD-10-CM | POA: Diagnosis not present

## 2021-06-02 DIAGNOSIS — I4891 Unspecified atrial fibrillation: Secondary | ICD-10-CM | POA: Diagnosis not present

## 2021-06-02 DIAGNOSIS — E079 Disorder of thyroid, unspecified: Secondary | ICD-10-CM | POA: Diagnosis not present

## 2021-06-02 DIAGNOSIS — D631 Anemia in chronic kidney disease: Secondary | ICD-10-CM | POA: Diagnosis not present

## 2021-06-02 DIAGNOSIS — I442 Atrioventricular block, complete: Secondary | ICD-10-CM | POA: Diagnosis not present

## 2021-06-02 DIAGNOSIS — K5790 Diverticulosis of intestine, part unspecified, without perforation or abscess without bleeding: Secondary | ICD-10-CM | POA: Diagnosis not present

## 2021-06-02 DIAGNOSIS — N184 Chronic kidney disease, stage 4 (severe): Secondary | ICD-10-CM | POA: Diagnosis not present

## 2021-06-02 DIAGNOSIS — I252 Old myocardial infarction: Secondary | ICD-10-CM | POA: Diagnosis not present

## 2021-06-02 DIAGNOSIS — E1122 Type 2 diabetes mellitus with diabetic chronic kidney disease: Secondary | ICD-10-CM | POA: Diagnosis not present

## 2021-06-02 DIAGNOSIS — I2510 Atherosclerotic heart disease of native coronary artery without angina pectoris: Secondary | ICD-10-CM | POA: Diagnosis not present

## 2021-06-02 DIAGNOSIS — I5022 Chronic systolic (congestive) heart failure: Secondary | ICD-10-CM | POA: Diagnosis not present

## 2021-06-02 DIAGNOSIS — D509 Iron deficiency anemia, unspecified: Secondary | ICD-10-CM | POA: Diagnosis not present

## 2021-06-02 DIAGNOSIS — S72145D Nondisplaced intertrochanteric fracture of left femur, subsequent encounter for closed fracture with routine healing: Secondary | ICD-10-CM | POA: Diagnosis not present

## 2021-06-02 DIAGNOSIS — E785 Hyperlipidemia, unspecified: Secondary | ICD-10-CM | POA: Diagnosis not present

## 2021-06-02 DIAGNOSIS — I130 Hypertensive heart and chronic kidney disease with heart failure and stage 1 through stage 4 chronic kidney disease, or unspecified chronic kidney disease: Secondary | ICD-10-CM | POA: Diagnosis not present

## 2021-06-03 DIAGNOSIS — N3090 Cystitis, unspecified without hematuria: Secondary | ICD-10-CM | POA: Diagnosis not present

## 2021-06-03 DIAGNOSIS — N3001 Acute cystitis with hematuria: Secondary | ICD-10-CM | POA: Diagnosis not present

## 2021-06-03 DIAGNOSIS — N184 Chronic kidney disease, stage 4 (severe): Secondary | ICD-10-CM | POA: Diagnosis not present

## 2021-06-03 DIAGNOSIS — E1122 Type 2 diabetes mellitus with diabetic chronic kidney disease: Secondary | ICD-10-CM | POA: Diagnosis not present

## 2021-06-06 ENCOUNTER — Ambulatory Visit: Payer: Medicare Other | Admitting: Cardiology

## 2021-06-06 ENCOUNTER — Encounter: Payer: Self-pay | Admitting: Cardiology

## 2021-06-06 VITALS — BP 118/60 | HR 60 | Ht 63.0 in | Wt 175.2 lb

## 2021-06-06 DIAGNOSIS — N184 Chronic kidney disease, stage 4 (severe): Secondary | ICD-10-CM

## 2021-06-06 DIAGNOSIS — I480 Paroxysmal atrial fibrillation: Secondary | ICD-10-CM | POA: Diagnosis not present

## 2021-06-06 DIAGNOSIS — Z950 Presence of cardiac pacemaker: Secondary | ICD-10-CM

## 2021-06-06 DIAGNOSIS — I129 Hypertensive chronic kidney disease with stage 1 through stage 4 chronic kidney disease, or unspecified chronic kidney disease: Secondary | ICD-10-CM | POA: Diagnosis not present

## 2021-06-06 DIAGNOSIS — I2510 Atherosclerotic heart disease of native coronary artery without angina pectoris: Secondary | ICD-10-CM

## 2021-06-06 DIAGNOSIS — Z9989 Dependence on other enabling machines and devices: Secondary | ICD-10-CM

## 2021-06-06 DIAGNOSIS — Z95818 Presence of other cardiac implants and grafts: Secondary | ICD-10-CM

## 2021-06-06 DIAGNOSIS — Z96642 Presence of left artificial hip joint: Secondary | ICD-10-CM

## 2021-06-06 DIAGNOSIS — G4733 Obstructive sleep apnea (adult) (pediatric): Secondary | ICD-10-CM | POA: Diagnosis not present

## 2021-06-06 NOTE — Patient Instructions (Signed)
Medication Instructions:  Your physician recommends that you continue on your current medications as directed. Please refer to the Current Medication list given to you today.  *If you need a refill on your cardiac medications before your next appointment, please call your pharmacy*   Lab Work: None Ordered If you have labs (blood work) drawn today and your tests are completely normal, you will receive your results only by: MyChart Message (if you have MyChart) OR A paper copy in the mail If you have any lab test that is abnormal or we need to change your treatment, we will call you to review the results.   Testing/Procedures: None Ordered   Follow-Up: At [facility], you and your health needs are our priority.  As part of our continuing mission to provide you with exceptional heart care, we have created designated Provider Care Teams.  These Care Teams include your primary Cardiologist (physician) and Advanced Practice Providers (APPs -  Physician Assistants and Nurse Practitioners) who all work together to provide you with the care you need, when you need it.  We recommend signing up for the patient portal called "MyChart".  Sign up information is provided on this After Visit Summary.  MyChart is used to connect with patients for Virtual Visits (Telemedicine).  Patients are able to view lab/test results, encounter notes, upcoming appointments, etc.  Non-urgent messages can be sent to your provider as well.   To learn more about what you can do with MyChart, go to [email or web address].    Your next appointment:   4 month(s)  The format for your next appointment:   In Person  Provider:   Robert Krasowski, MD    Other Instructions NA  

## 2021-06-06 NOTE — Progress Notes (Signed)
?Cardiology Office Note:   ? ?Date:  06/06/2021  ? ?ID:  Amanda [name], DOB 1940/11/21, MRN [id number] ? ?PCP:  Street, Sharon Mt, MD  ?Cardiologist:  Jenne Campus, MD   ? ?Referring MD: Street, Sharon Mt, *  ? ?Chief Complaint  ?Patient presents with  ? Follow-up  ?Doing very well now ? ?History of Present Illness:   ? ?Amanda [name] is a 81 y.o. female   with past medical history significant for paroxysmal atrial fibrillation, coronary artery disease, status post PTCA and stenting to mid RCA in 2018, history of CVA, she is not anticoagulated because of frequent GI bleed however she does have watchman device, chronic kidney failure with creatinine of 1.7, diabetes, essential hypertension, obstructive sleep apnea. ?Recently she ended up going back to atrial fibrillation.  She did have cardioversion done again in April 14.  Since that time she is feeling better.  In the meantime she also sustained fracture of the leg required surgical intervention she had a hip replacement done at that time not recovering doing much but did not feel good.  She denies have any chest pain tightness squeezing pressure mid chest no shortness of breath appears to be compensated on physical exam ? ?Past Medical History:  ?Diagnosis Date  ? Acute ischemic stroke ([place])   ? Acute metabolic encephalopathy   ? Acute on chronic systolic (congestive) heart failure ([place])   ? Acute renal insufficiency 09/02/2016  ? Anemia due to stage 4 chronic kidney disease ([place]) 04/04/2015  ? Atrial fibrillation ([place])   ? Benign hypertension with CKD (chronic kidney disease) stage IV ([place]) 04/04/2015  ? Bradycardia 09/17/2014  ? Chest pain 06/23/2016  ? Overview:  Added automatically from request for surgery [id number]  ? Chronic anemia   ? Chronic kidney disease (CKD), stage III (moderate) (HCC)   ? Coronary artery disease 10/12/2016  ? Non drug-eluting stent implanted in June 2018 to mid RCA   08/08/2016 10:37  Angiographic Findings  Cardiac Arteries and  Lesion Findings LMCA: 0% and Normal. LAD: 0% and Normal. RCA:   Lesion on Mid RCA: Mid subsection.95% stenosis reduced to 0%. Pre procedure   TIMI II flow was noted. Post Procedure TIMI III flow was present. Poor run   off was present. The lesion was diagnosed as High Risk (C).   ? 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 Diabetes mellitus without complication ([place])   ? Diverticulosis   ? Dyslipidemia 09/17/2014  ? Gastroesophageal reflux   ? GI bleed 08/06/2016  ? Heart block AV complete ([place]) 09/05/2019  ? History of cardiac monitoring 02/23/2018  ? monitor inserted  ? History of iron deficiency anemia 12/28/2017  ? Hyperlipidemia   ? Hypertensive heart disease with heart failure ([place])   ? Iron deficiency anemia 12/28/2017  ? LV dysfunction 09/02/2016  ? Myocardial infarction [facility])   ? NSTEMI (non-ST elevated myocardial infarction) ([place]) 08/06/2016  ? Obstructive sleep apnea 09/17/2014  ? Orthostatic hypotension 12/28/2017  ? OSA on CPAP   ? 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  ? Postural dizziness with presyncope 09/23/2017  ? 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/24/2017  ? 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 Renal failure, chronic, stage 3 (moderate) (HCC)   ? Syncope 11/04/2017  ? Systolic heart failure ([place])   ? Thyroid disease   ? Vitamin D deficiency 04/04/2015  ? ? ?Past Surgical History:  ?Procedure Laterality Date  ? BUBBLE  STUDY  05/16/2020  ? Procedure: BUBBLE STUDY;  Surgeon: Elouise Munroe, MD;  Location: [place];  Service: Cardiovascular;;  ? CARDIAC CATHETERIZATION    ? CARDIOVERSION N/A 05/16/2020  ? Procedure: CARDIOVERSION;  Surgeon: Elouise Munroe, MD;  Location: [place];  Service: Cardiovascular;  Laterality: N/A;  ? CARDIOVERSION N/A 09/06/2020  ? Procedure: CARDIOVERSION;  Surgeon: Donato Heinz, MD;  Location: [facility] ENDOSCOPY;  Service: Cardiovascular;  Laterality: N/A;  ? CARDIOVERSION N/A 05/16/2021  ? Procedure:  CARDIOVERSION;  Surgeon: Sueanne Margarita, MD;  Location: [facility] ENDOSCOPY;  Service: Cardiovascular;  Laterality: N/A;  ? COLONOSCOPY  11/21/2014  ? Moderate predominantly sigmoid diverticulosis. Otherwise noraml collonscopy to TI.   ? CORONARY ANGIOPLASTY WITH STENT PLACEMENT  08/2016  ? EXCISIONAL HEMORRHOIDECTOMY    ? LEFT ATRIAL APPENDAGE OCCLUSION  11/2016  ? in [place]  ? LOOP RECORDER INSERTION N/A 02/23/2018  ? Procedure: LOOP RECORDER INSERTION;  Surgeon: Constance Haw, MD;  Location: [place] CV LAB;  Service: Cardiovascular;  Laterality: N/A;  ? LOOP RECORDER REMOVAL N/A 04/24/2019  ? Procedure: LOOP RECORDER REMOVAL;  Surgeon: Constance Haw, MD;  Location: [place] CV LAB;  Service: Cardiovascular;  Laterality: N/A;  ? NOSE SURGERY    ? PACEMAKER IMPLANT N/A 04/24/2019  ? Procedure: PACEMAKER IMPLANT;  Surgeon: Constance Haw, MD;  Location: [place] CV LAB;  Service: Cardiovascular;  Laterality: N/A;  ? TEE WITHOUT CARDIOVERSION N/A 05/16/2020  ? Procedure: TRANSESOPHAGEAL ECHOCARDIOGRAM (TEE);  Surgeon: Elouise Munroe, MD;  Location: [facility] ENDOSCOPY;  Service: Cardiovascular;  Laterality: N/A;  ? TOTAL HIP REVISION Left 03/25/2021  ? Procedure: LEFT POSTERIOR TOTAL HIP  ZIMMER CABLES;  Surgeon: Paralee Cancel, MD;  Location: [facility] ORS;  Service: Orthopedics;  Laterality: Left;  ? ? ?Current Medications: ?Current Meds  ?Medication Sig  ? albuterol (VENTOLIN HFA) 108 (90 Base) MCG/ACT inhaler Inhale 2 puffs into the lungs every 6 (six) hours as needed for wheezing or shortness of breath.  ? amiodarone (PACERONE) 200 MG tablet TAKE 1 TABLET BY MOUTH DAILY. (Patient taking differently: Take 200 mg by mouth daily.)  ? apixaban (ELIQUIS) 2.5 MG TABS tablet Take 1 tablet (2.5 mg total) by mouth 2 (two) times daily.  ? BIOTIN PO Take 1,000 mcg by mouth 2 (two) times daily.  ? blood glucose meter kit and supplies KIT Dispense based on patient and insurance preference. Use up to four times  daily as directed. (Patient taking differently: Inject 1 each into the skin as directed. Dispense based on patient and insurance preference. Use up to four times daily as directed.)  ? carboxymethylcellul-glycerin (REFRESH RELIEVA) 0.5-0.9 % ophthalmic solution Place 1 drop into both eyes daily as needed for dry eyes. VE  ? Cholecalciferol (VITAMIN D3) 50 MCG (2000 UT) TABS Take 2,000 Units by mouth daily at 12 noon.  ? Coenzyme Q10 100 MG TABS Take 100 mg by mouth daily at 12 noon.   ? colchicine 0.6 MG tablet Take 0.6 mg by mouth daily as needed (gout). For gout flare ups  ? denosumab (PROLIA) 60 MG/ML SOSY injection Inject 60 mg into the skin every 6 (six) months.  ? docusate sodium (COLACE) 100 MG capsule Take 100 mg by mouth 2 (two) times daily.  ? estradiol (ESTRACE) 0.1 MG/GM vaginal cream Place 1 Applicatorful vaginally every Monday, Wednesday, and Friday.   ? Evolocumab (REPATHA) 140 MG/ML SOSY Inject 140 mg into the skin every 14 (fourteen) days. Every other Thursday  ?  ezetimibe (ZETIA) 10 MG tablet Take 1 tablet (10 mg total) by mouth every evening.  ? famotidine (PEPCID) 40 MG tablet Take 40 mg by mouth daily.  ? febuxostat (ULORIC) 40 MG tablet Take 40 mg by mouth at bedtime.  ? feeding supplement, GLUCERNA SHAKE, (GLUCERNA SHAKE) LIQD Take 237 mLs by mouth 3 (three) times daily between meals. (Patient taking differently: Take 237 mLs by mouth daily.)  ? ferrous sulfate 325 (65 FE) MG EC tablet Take 325 mg by mouth 2 (two) times daily with breakfast and lunch.  ? folic acid (FOLVITE) 795 MCG tablet Take 800 mcg by mouth daily at 12 noon.  ? furosemide (LASIX) 20 MG tablet Take 40 mg by mouth 2 (two) times daily with breakfast and lunch.  ? Glucosamine-Chondroitin (OSTEO BI-FLEX REGULAR STRENGTH PO) Take 25 mcg by mouth daily at 12 noon. Plus D3  ? hydrALAZINE (APRESOLINE) 50 MG tablet Take 1 tablet (50 mg total) by mouth every 6 (six) hours.  ? insulin glargine (LANTUS) 100 UNIT/ML Solostar Pen Inject  3 Units into the skin daily.  ? isosorbide mononitrate (IMDUR) 30 MG 24 hr tablet TAKE 1 TABLET BY MOUTH DAILY (Patient taking differently: Take 30 mg by mouth daily.)  ? Lactobacillus [facility] ACIDOPHI

## 2021-06-10 ENCOUNTER — Telehealth: Payer: Self-pay | Admitting: Cardiology

## 2021-06-10 ENCOUNTER — Other Ambulatory Visit: Payer: Self-pay

## 2021-06-10 NOTE — Telephone Encounter (Signed)
?*  STAT* If patient is at the pharmacy, call can be transferred to refill team. ? ? ?1. Which medications need to be refilled? (please list name of each medication and dose if known) metoprolol tartrate (LOPRESSOR) 25 MG tablet ? ?2. Which pharmacy/location (including street and city if local pharmacy) is medication to be sent to? [facility] ? ?3. Do they need a 30 day or 90 day supply? 90 ? ? ?Pt completely out, [facility] states that several requests have been faxed to our office for refills.  ?

## 2021-06-16 MED ORDER — METOPROLOL TARTRATE 25 MG PO TABS: 12.5000 mg | ORAL_TABLET | Freq: Two times a day (BID) | ORAL | 2 refills | Status: DC

## 2021-06-16 NOTE — Telephone Encounter (Signed)
Rx sent 

## 2021-06-18 DIAGNOSIS — Z4789 Encounter for other orthopedic aftercare: Secondary | ICD-10-CM | POA: Diagnosis not present

## 2021-06-19 ENCOUNTER — Other Ambulatory Visit (HOSPITAL_COMMUNITY): Payer: Medicare Other

## 2021-06-23 ENCOUNTER — Other Ambulatory Visit (HOSPITAL_COMMUNITY): Payer: Self-pay | Admitting: *Deleted

## 2021-06-23 ENCOUNTER — Ambulatory Visit: Payer: Medicare Other | Admitting: Cardiology

## 2021-06-23 MED ORDER — APIXABAN 2.5 MG PO TABS: 2.5000 mg | ORAL_TABLET | Freq: Two times a day (BID) | ORAL | 6 refills | Status: DC

## 2021-07-02 ENCOUNTER — Inpatient Hospital Stay: Admit: 2021-07-02 | Payer: Medicare Other | Admitting: Orthopedic Surgery

## 2021-07-02 DIAGNOSIS — M1612 Unilateral primary osteoarthritis, left hip: Secondary | ICD-10-CM

## 2021-07-02 SURGERY — ARTHROPLASTY, HIP, TOTAL, ANTERIOR APPROACH
Anesthesia: Choice | Site: Hip | Laterality: Left

## 2021-07-08 DIAGNOSIS — M25552 Pain in left hip: Secondary | ICD-10-CM | POA: Diagnosis not present

## 2021-07-08 DIAGNOSIS — M62552 Muscle wasting and atrophy, not elsewhere classified, left thigh: Secondary | ICD-10-CM | POA: Diagnosis not present

## 2021-07-08 DIAGNOSIS — R2689 Other abnormalities of gait and mobility: Secondary | ICD-10-CM | POA: Diagnosis not present

## 2021-07-08 DIAGNOSIS — Z96652 Presence of left artificial knee joint: Secondary | ICD-10-CM | POA: Diagnosis not present

## 2021-07-10 DIAGNOSIS — M62552 Muscle wasting and atrophy, not elsewhere classified, left thigh: Secondary | ICD-10-CM | POA: Diagnosis not present

## 2021-07-10 DIAGNOSIS — R2689 Other abnormalities of gait and mobility: Secondary | ICD-10-CM | POA: Diagnosis not present

## 2021-07-10 DIAGNOSIS — M25552 Pain in left hip: Secondary | ICD-10-CM | POA: Diagnosis not present

## 2021-07-10 DIAGNOSIS — Z96652 Presence of left artificial knee joint: Secondary | ICD-10-CM | POA: Diagnosis not present

## 2021-07-14 DIAGNOSIS — D500 Iron deficiency anemia secondary to blood loss (chronic): Secondary | ICD-10-CM | POA: Diagnosis not present

## 2021-07-14 DIAGNOSIS — R2689 Other abnormalities of gait and mobility: Secondary | ICD-10-CM | POA: Diagnosis not present

## 2021-07-14 DIAGNOSIS — N3090 Cystitis, unspecified without hematuria: Secondary | ICD-10-CM | POA: Diagnosis not present

## 2021-07-14 DIAGNOSIS — M62552 Muscle wasting and atrophy, not elsewhere classified, left thigh: Secondary | ICD-10-CM | POA: Diagnosis not present

## 2021-07-14 DIAGNOSIS — M1A09X0 Idiopathic chronic gout, multiple sites, without tophus (tophi): Secondary | ICD-10-CM | POA: Diagnosis not present

## 2021-07-14 DIAGNOSIS — E039 Hypothyroidism, unspecified: Secondary | ICD-10-CM | POA: Diagnosis not present

## 2021-07-14 DIAGNOSIS — Z96652 Presence of left artificial knee joint: Secondary | ICD-10-CM | POA: Diagnosis not present

## 2021-07-14 DIAGNOSIS — M25552 Pain in left hip: Secondary | ICD-10-CM | POA: Diagnosis not present

## 2021-07-14 DIAGNOSIS — N3001 Acute cystitis with hematuria: Secondary | ICD-10-CM | POA: Diagnosis not present

## 2021-07-14 DIAGNOSIS — N184 Chronic kidney disease, stage 4 (severe): Secondary | ICD-10-CM | POA: Diagnosis not present

## 2021-07-14 DIAGNOSIS — E1122 Type 2 diabetes mellitus with diabetic chronic kidney disease: Secondary | ICD-10-CM | POA: Diagnosis not present

## 2021-07-16 DIAGNOSIS — M25552 Pain in left hip: Secondary | ICD-10-CM | POA: Diagnosis not present

## 2021-07-16 DIAGNOSIS — R2689 Other abnormalities of gait and mobility: Secondary | ICD-10-CM | POA: Diagnosis not present

## 2021-07-16 DIAGNOSIS — Z96652 Presence of left artificial knee joint: Secondary | ICD-10-CM | POA: Diagnosis not present

## 2021-07-16 DIAGNOSIS — M62552 Muscle wasting and atrophy, not elsewhere classified, left thigh: Secondary | ICD-10-CM | POA: Diagnosis not present

## 2021-07-21 ENCOUNTER — Ambulatory Visit (INDEPENDENT_AMBULATORY_CARE_PROVIDER_SITE_OTHER): Payer: Medicare Other

## 2021-07-21 DIAGNOSIS — Z96652 Presence of left artificial knee joint: Secondary | ICD-10-CM | POA: Diagnosis not present

## 2021-07-21 DIAGNOSIS — I442 Atrioventricular block, complete: Secondary | ICD-10-CM

## 2021-07-21 DIAGNOSIS — R2689 Other abnormalities of gait and mobility: Secondary | ICD-10-CM | POA: Diagnosis not present

## 2021-07-21 DIAGNOSIS — M25552 Pain in left hip: Secondary | ICD-10-CM | POA: Diagnosis not present

## 2021-07-21 DIAGNOSIS — M62552 Muscle wasting and atrophy, not elsewhere classified, left thigh: Secondary | ICD-10-CM | POA: Diagnosis not present

## 2021-07-22 LAB — CUP PACEART REMOTE DEVICE CHECK
Battery Remaining Longevity: 152 mo
Battery Voltage: 3.04 V
Brady Statistic AP VP Percent: 0.01 %
Brady Statistic AP VS Percent: 12.72 %
Brady Statistic AS VP Percent: 0.04 %
Brady Statistic AS VS Percent: 87.24 %
Brady Statistic RA Percent Paced: 12.71 %
Brady Statistic RV Percent Paced: 0.06 %
Date Time Interrogation Session: 20230619112540
Implantable Lead Implant Date: 20210322
Implantable Lead Implant Date: 20210322
Implantable Lead Location: 753859
Implantable Lead Location: 753860
Implantable Lead Model: 5076
Implantable Lead Model: 5076
Implantable Pulse Generator Implant Date: 20210322
Lead Channel Impedance Value: 304 Ohm
Lead Channel Impedance Value: 342 Ohm
Lead Channel Impedance Value: 399 Ohm
Lead Channel Impedance Value: 475 Ohm
Lead Channel Pacing Threshold Amplitude: 0.75 V
Lead Channel Pacing Threshold Amplitude: 0.875 V
Lead Channel Pacing Threshold Pulse Width: 0.4 ms
Lead Channel Pacing Threshold Pulse Width: 0.4 ms
Lead Channel Sensing Intrinsic Amplitude: 4.5 mV
Lead Channel Sensing Intrinsic Amplitude: 4.5 mV
Lead Channel Sensing Intrinsic Amplitude: 9.125 mV
Lead Channel Sensing Intrinsic Amplitude: 9.125 mV
Lead Channel Setting Pacing Amplitude: 1.75 V
Lead Channel Setting Pacing Amplitude: 2.5 V
Lead Channel Setting Pacing Pulse Width: 0.4 ms
Lead Channel Setting Sensing Sensitivity: 2 mV

## 2021-07-23 DIAGNOSIS — M25552 Pain in left hip: Secondary | ICD-10-CM | POA: Diagnosis not present

## 2021-07-23 DIAGNOSIS — Z96652 Presence of left artificial knee joint: Secondary | ICD-10-CM | POA: Diagnosis not present

## 2021-07-23 DIAGNOSIS — M62552 Muscle wasting and atrophy, not elsewhere classified, left thigh: Secondary | ICD-10-CM | POA: Diagnosis not present

## 2021-07-23 DIAGNOSIS — R2689 Other abnormalities of gait and mobility: Secondary | ICD-10-CM | POA: Diagnosis not present

## 2021-07-25 DIAGNOSIS — R2689 Other abnormalities of gait and mobility: Secondary | ICD-10-CM | POA: Diagnosis not present

## 2021-07-25 DIAGNOSIS — M62552 Muscle wasting and atrophy, not elsewhere classified, left thigh: Secondary | ICD-10-CM | POA: Diagnosis not present

## 2021-07-25 DIAGNOSIS — Z96652 Presence of left artificial knee joint: Secondary | ICD-10-CM | POA: Diagnosis not present

## 2021-07-25 DIAGNOSIS — M25552 Pain in left hip: Secondary | ICD-10-CM | POA: Diagnosis not present

## 2021-07-30 DIAGNOSIS — M25552 Pain in left hip: Secondary | ICD-10-CM | POA: Diagnosis not present

## 2021-07-30 DIAGNOSIS — M62552 Muscle wasting and atrophy, not elsewhere classified, left thigh: Secondary | ICD-10-CM | POA: Diagnosis not present

## 2021-07-30 DIAGNOSIS — R2689 Other abnormalities of gait and mobility: Secondary | ICD-10-CM | POA: Diagnosis not present

## 2021-07-30 DIAGNOSIS — Z96652 Presence of left artificial knee joint: Secondary | ICD-10-CM | POA: Diagnosis not present

## 2021-07-31 DIAGNOSIS — E559 Vitamin D deficiency, unspecified: Secondary | ICD-10-CM | POA: Diagnosis not present

## 2021-07-31 DIAGNOSIS — E1122 Type 2 diabetes mellitus with diabetic chronic kidney disease: Secondary | ICD-10-CM | POA: Diagnosis not present

## 2021-07-31 DIAGNOSIS — I129 Hypertensive chronic kidney disease with stage 1 through stage 4 chronic kidney disease, or unspecified chronic kidney disease: Secondary | ICD-10-CM | POA: Diagnosis not present

## 2021-07-31 DIAGNOSIS — M898X9 Other specified disorders of bone, unspecified site: Secondary | ICD-10-CM | POA: Diagnosis not present

## 2021-08-01 DIAGNOSIS — M25552 Pain in left hip: Secondary | ICD-10-CM | POA: Diagnosis not present

## 2021-08-01 DIAGNOSIS — R2689 Other abnormalities of gait and mobility: Secondary | ICD-10-CM | POA: Diagnosis not present

## 2021-08-01 DIAGNOSIS — M62552 Muscle wasting and atrophy, not elsewhere classified, left thigh: Secondary | ICD-10-CM | POA: Diagnosis not present

## 2021-08-01 DIAGNOSIS — Z96652 Presence of left artificial knee joint: Secondary | ICD-10-CM | POA: Diagnosis not present

## 2021-08-04 ENCOUNTER — Ambulatory Visit (INDEPENDENT_AMBULATORY_CARE_PROVIDER_SITE_OTHER): Payer: Medicare Other | Admitting: Cardiology

## 2021-08-04 ENCOUNTER — Encounter: Payer: Self-pay | Admitting: Cardiology

## 2021-08-04 VITALS — BP 154/74 | HR 62 | Ht 63.0 in | Wt 179.0 lb

## 2021-08-04 DIAGNOSIS — Z79899 Other long term (current) drug therapy: Secondary | ICD-10-CM

## 2021-08-04 DIAGNOSIS — I4819 Other persistent atrial fibrillation: Secondary | ICD-10-CM | POA: Diagnosis not present

## 2021-08-04 DIAGNOSIS — D6869 Other thrombophilia: Secondary | ICD-10-CM

## 2021-08-04 MED ORDER — AMLODIPINE BESYLATE 5 MG PO TABS
5.0000 mg | ORAL_TABLET | Freq: Every day | ORAL | 6 refills | Status: DC
Start: 2021-08-04 — End: 2022-02-10

## 2021-08-04 NOTE — Patient Instructions (Signed)
Medication Instructions:  Your physician has recommended you make the following change in your medication:  START Amlodipine (Norvasc) 5 mg daily  *If you need a refill on your cardiac medications before your next appointment, please call your pharmacy*   Lab Work: Amiodarone surveillance labs today: TSH & LFTs  If you have labs (blood work) drawn today and your tests are completely normal, you will receive your results only by: [place] (if you have MyChart) OR A paper copy in the mail If you have any lab test that is abnormal or we need to change your treatment, we will call you to review the results.   Testing/Procedures: None ordered   Follow-Up: At [facility], you and your health needs are our priority.  As part of our continuing mission to provide you with exceptional heart care, we have created designated Provider Care Teams.  These Care Teams include your primary Cardiologist (physician) and Advanced Practice Providers (APPs -  Physician Assistants and Nurse Practitioners) who all work together to provide you with the care you need, when you need it.  Your next appointment:   6 month(s)  The format for your next appointment:   In Person  Provider:   Allegra Lai, MD    Thank you for choosing [facility]!!   Trinidad Curet, RN [phone]  Other Instructions   Important Information About Sugar

## 2021-08-04 NOTE — Progress Notes (Signed)
Electrophysiology Office Note   Date:  08/04/2021   ID:  Amanda [name], DOB [birth year], MRN [id number]  PCP:  Street, Sharon Mt, MD  Cardiologist: Agustin Cree Primary Electrophysiologist:  Jamaal Bernasconi Meredith Leeds, MD    No chief complaint on file.     History of Present Illness: Amanda [name] is a 81 y.o. female who is being seen today for the evaluation of atrial fibrillation, syncope at the request of [facility]. Presenting today for electrophysiology evaluation.    She has a history significant for ischemic stroke, atrial fibrillation, CKD stage III, diabetes, hypertension, OSA on CPAP.  She is status post watchman due to recurrent GI bleeding.  She has a history of syncope and is status post Medtronic dual-chamber pacemaker implanted 04/24/2019 for long pauses and intermittent heart block.  She is currently on amiodarone for atrial fibrillation.  Today, denies symptoms of palpitations, chest pain, shortness of breath, orthopnea, PND, lower extremity edema, claudication, dizziness, presyncope, syncope, bleeding, or neurologic sequela. The patient is tolerating medications without difficulties.  Since being seen she has done well.  She has had no further episodes of atrial fibrillation since her cardioversion in March.  She is overall quite happy with her control.  She states that her blood pressure over the last few weeks have been in the 160s at home.   Past Medical History:  Diagnosis Date   Acute ischemic stroke ([place])    Acute metabolic encephalopathy    Acute on chronic systolic (congestive) heart failure (HCC)    Acute renal insufficiency 09/02/2016   Anemia due to stage 4 chronic kidney disease ([place]) 04/04/2015   Atrial fibrillation (HCC)    Benign hypertension with CKD (chronic kidney disease) stage IV ([place]) 04/04/2015   Bradycardia 09/17/2014   Chest pain 06/23/2016   Overview:  Added automatically from request for surgery [id number]   Chronic anemia    Chronic kidney  disease (CKD), stage III (moderate) ([place])    Coronary artery disease 10/12/2016   Non drug-eluting stent implanted in June 2018 to mid RCA   08/08/2016 10:37  Angiographic Findings  Cardiac Arteries and Lesion Findings LMCA: 0% and Normal. LAD: 0% and Normal. RCA:   Lesion on Mid RCA: Mid subsection.95% stenosis reduced to 0%. Pre procedure   TIMI II flow was noted. Post Procedure TIMI III flow was present. Poor run   off was present. The lesion was diagnosed as High Risk (C).    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)    NSTEMI (non-ST elevated myocardial infarction) ([place]) 08/06/2016   Obstructive sleep apnea 09/17/2014   Orthostatic hypotension 12/28/2017   OSA on CPAP    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   Postural dizziness with presyncope 09/23/2017   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/24/2017   Recurrent left pleural effusion 03/14/2015   Renal failure, chronic, stage 3 (moderate) (HCC)    Syncope 21/03/2480   Systolic heart failure ([place])    Thyroid disease  Vitamin D deficiency 04/04/2015   Past Surgical History:  Procedure Laterality Date   BUBBLE STUDY  05/16/2020   Procedure: BUBBLE STUDY;  Surgeon: Elouise Munroe, MD;  Location: [place];  Service: Cardiovascular;;   CARDIAC CATHETERIZATION     CARDIOVERSION N/A 05/16/2020   Procedure: CARDIOVERSION;  Surgeon: Elouise Munroe, MD;  Location: [place];  Service: Cardiovascular;  Laterality: N/A;    CARDIOVERSION N/A 09/06/2020   Procedure: CARDIOVERSION;  Surgeon: Donato Heinz, MD;  Location: [place];  Service: Cardiovascular;  Laterality: N/A;   CARDIOVERSION N/A 05/16/2021   Procedure: CARDIOVERSION;  Surgeon: Sueanne Margarita, MD;  Location: [facility] ENDOSCOPY;  Service: Cardiovascular;  Laterality: N/A;   COLONOSCOPY  11/21/2014   Moderate predominantly sigmoid diverticulosis. Otherwise noraml collonscopy to TI.    CORONARY ANGIOPLASTY WITH STENT PLACEMENT  08/2016   EXCISIONAL HEMORRHOIDECTOMY     LEFT ATRIAL APPENDAGE OCCLUSION  11/2016   in [place] N/A 02/23/2018   Procedure: [place];  Surgeon: Constance Haw, MD;  Location: [place] CV LAB;  Service: Cardiovascular;  Laterality: N/A;   LOOP RECORDER REMOVAL N/A 04/24/2019   Procedure: LOOP RECORDER REMOVAL;  Surgeon: Constance Haw, MD;  Location: [place] CV LAB;  Service: Cardiovascular;  Laterality: N/A;   NOSE SURGERY     PACEMAKER IMPLANT N/A 04/24/2019   Procedure: PACEMAKER IMPLANT;  Surgeon: Constance Haw, MD;  Location: [place] CV LAB;  Service: Cardiovascular;  Laterality: N/A;   TEE WITHOUT CARDIOVERSION N/A 05/16/2020   Procedure: TRANSESOPHAGEAL ECHOCARDIOGRAM (TEE);  Surgeon: Elouise Munroe, MD;  Location: [facility]COPY;  Service: Cardiovascular;  Laterality: N/A;   TOTAL HIP REVISION Left 03/25/2021   Procedure: LEFT POSTERIOR TOTAL HIP  ZIMMER CABLES;  Surgeon: Paralee Cancel, MD;  Location: [facility] ORS;  Service: Orthopedics;  Laterality: Left;     Current Outpatient Medications  Medication Sig Dispense Refill   albuterol (VENTOLIN HFA) 108 (90 Base) MCG/ACT inhaler Inhale 2 puffs into the lungs every 6 (six) hours as needed for wheezing or shortness of breath.     amiodarone (PACERONE) 200 MG tablet TAKE 1 TABLET BY MOUTH DAILY. (Patient taking differently: Take 200 mg by mouth daily.) 90 tablet 2   amLODipine (NORVASC) 5 MG tablet  Take 1 tablet (5 mg total) by mouth daily. 30 tablet 6   BIOTIN PO Take 1,000 mcg by mouth 2 (two) times daily.     blood glucose meter kit and supplies KIT Dispense based on patient and insurance preference. Use up to four times daily as directed. (Patient taking differently: Inject 1 each into the skin as directed. Dispense based on patient and insurance preference. Use up to four times daily as directed.) 1 each 0   carboxymethylcellul-glycerin (REFRESH RELIEVA) 0.5-0.9 % ophthalmic solution Place 1 drop into both eyes daily as needed for dry eyes. VE     Cholecalciferol (VITAMIN D3) 50 MCG (2000 UT) TABS Take 2,000 Units by mouth daily at 12 noon.     Coenzyme Q10 100 MG TABS Take 100 mg by mouth daily at 12 noon.      colchicine 0.6 MG tablet Take 0.6 mg by mouth daily as needed (gout). For gout flare ups     denosumab (PROLIA) 60 MG/ML SOSY injection Inject 60 mg into the skin every 6 (six) months.     docusate sodium (COLACE) 100 MG capsule Take 100 mg by mouth 2 (two) times daily.  estradiol (ESTRACE) 0.1 MG/GM vaginal cream Place 1 Applicatorful vaginally every Monday, Wednesday, and Friday.      Evolocumab (REPATHA) 140 MG/ML SOSY Inject 140 mg into the skin every 14 (fourteen) days. Every other Thursday     ezetimibe (ZETIA) 10 MG tablet Take 1 tablet (10 mg total) by mouth every evening. 90 tablet 2   famotidine (PEPCID) 40 MG tablet Take 40 mg by mouth daily.     febuxostat (ULORIC) 40 MG tablet Take 40 mg by mouth at bedtime.     feeding supplement, GLUCERNA SHAKE, (GLUCERNA SHAKE) LIQD Take 237 mLs by mouth 3 (three) times daily between meals. (Patient taking differently: Take 237 mLs by mouth daily.) 237 mL 0   ferrous sulfate 325 (65 FE) MG EC tablet Take 325 mg by mouth 2 (two) times daily with breakfast and lunch.     folic acid (FOLVITE) 073 MCG tablet Take 800 mcg by mouth daily at 12 noon.     furosemide (LASIX) 20 MG tablet Take 40 mg by mouth 2 (two) times daily with  breakfast and lunch.     Glucosamine-Chondroitin (OSTEO BI-FLEX REGULAR STRENGTH PO) Take 25 mcg by mouth daily at 12 noon. Plus D3     hydrALAZINE (APRESOLINE) 50 MG tablet Take 1 tablet (50 mg total) by mouth every 6 (six) hours. 360 tablet 2   insulin glargine (LANTUS) 100 UNIT/ML Solostar Pen Inject 3 Units into the skin daily. 3 mL 0   isosorbide mononitrate (IMDUR) 30 MG 24 hr tablet TAKE 1 TABLET BY MOUTH DAILY (Patient taking differently: Take 30 mg by mouth daily.) 90 tablet 3   Lactobacillus (FLORAJEN ACIDOPHILUS) CAPS Take 1 capsule by mouth daily at 12 noon. 390 mg each     levothyroxine (SYNTHROID, LEVOTHROID) 50 MCG tablet Take 50 mcg by mouth daily before breakfast.     Multiple Vitamins-Minerals (PRESERVISION AREDS 2) CAPS Take 1 capsule by mouth 2 (two) times daily.     NITROGLYCERIN PO Take 0.4 mg by mouth daily as needed (Chest pain). For chest pain     omega-3 acid ethyl esters (LOVAZA) 1 g capsule Take 2 capsules (2 g total) by mouth daily at 12 noon. 180 capsule 3   pantoprazole (PROTONIX) 40 MG tablet Take 1 tablet (40 mg total) by mouth daily. 90 tablet 1   polyethylene glycol powder (GLYCOLAX/MIRALAX) powder Take 17 g by mouth every other day. Alternating between miralax and benefiber     pravastatin (PRAVACHOL) 40 MG tablet Take 40 mg by mouth at bedtime.     ranolazine (RANEXA) 500 MG 12 hr tablet Take 1 tablet (500 mg total) by mouth 2 (two) times daily. 60 tablet 3   vitamin B-12 (CYANOCOBALAMIN) 1000 MCG tablet Take 1,000 mcg by mouth daily at 12 noon.     Wheat Dextrin (BENEFIBER DRINK MIX PO) Take 1 Scoop by mouth daily. Mix 1 capful with beverage and drink every other day     zolpidem (AMBIEN) 5 MG tablet Take 5 mg by mouth at bedtime as needed for sleep.     metoprolol tartrate (LOPRESSOR) 25 MG tablet Take 0.5 tablets (12.5 mg total) by mouth 2 (two) times daily. (Patient not taking: Reported on 08/04/2021) 180 tablet 2   No current facility-administered  medications for this visit.    Allergies:   Ciprofloxacin, Contrast media [iodinated contrast media], Nsaids, and Statins   Social History:  The patient  reports that she has quit smoking. She quit smokeless tobacco use  about 26 years ago. She reports that she does not currently use alcohol after a past usage of about 2.0 standard drinks of alcohol per week. She reports that she does not use drugs.   Family History:  The patient's family history includes Breast cancer in her mother; Cancer in her maternal grandfather; Diabetes in her sister; Heart attack in her brother, maternal uncle, and sister; Hypertension in her father; Prostate cancer in her father; Stroke in her father.   ROS:  Please see the history of present illness.   Otherwise, review of systems is positive for none.   All other systems are reviewed and negative.   PHYSICAL EXAM: VS:  BP (!) 154/74   Pulse 62   Ht _0  (1.6 m)   Wt 179 lb (81.2 kg)   SpO2 98%   BMI 31.71 kg/m  , BMI Body mass index is 31.71 kg/m. GEN: Well nourished, well developed, in no acute distress  HEENT: normal  Neck: no JVD, carotid bruits, or masses Cardiac: RRR; no murmurs, rubs, or gallops,no edema  Respiratory:  clear to auscultation bilaterally, normal work of breathing GI: soft, nontender, nondistended, + BS MS: no deformity or atrophy  Skin: warm and dry, device site well healed Neuro:  Strength and sensation are intact Psych: euthymic mood, full affect  EKG:  EKG is ordered today. Personal review of the ekg ordered shows sinus rhythm, rate 61  Personal review of the device interrogation today. Results in [place]: 03/25/2021: ALT 62 03/31/2021: B Natriuretic Peptide 789.8 05/16/2021: BUN 46; Creatinine, Ser 1.93; Hemoglobin 8.9; Platelets 270; Potassium 4.5; Sodium 140    Lipid Panel     Component Value Date/Time   CHOL 125 09/05/2019 0842   TRIG 162 (H) 09/05/2019 0842   HDL 59 09/05/2019 0842   CHOLHDL 2.1  09/05/2019 0842   LDLCALC 39 09/05/2019 0842     Wt Readings from Last 3 Encounters:  08/04/21 179 lb (81.2 kg)  06/06/21 175 lb 3.2 oz (79.5 kg)  05/22/21 181 lb 3.2 oz (82.2 kg)      Other studies Reviewed: Additional studies/ records that were reviewed today include: TTE 12/22/2017 Review of the above records today demonstrates:  - Left ventricle: The cavity size was normal. Wall thickness was   increased in a pattern of moderate LVH. Systolic function was   normal. The estimated ejection fraction was in the range of 60%   to 65%. Wall motion was normal; there were no regional wall   motion abnormalities. Features are consistent with a pseudonormal   left ventricular filling pattern, with concomitant abnormal   relaxation and increased filling pressure (grade 2 diastolic   dysfunction). - Aortic valve: There was mild stenosis. Valve area (VTI): 1.57   cm^2. Valve area (Vmax): 1.45 cm^2. Valve area (Vmean): 1.57   cm^2. - Mitral valve: There was mild regurgitation. Valve area by   pressure half-time: 2.34 cm^2. - Left atrium: The atrium was moderately dilated.   ASSESSMENT AND PLAN:  1.  Persistent atrial fibrillation: Status post watchman and thus not anticoagulated.  CHA2DS2-VASc of 7.  Currently on amiodarone 200 mg daily.  High risk medication monitoring with TSH, LFTs today.  She is currently feeling well and has had no further episodes of atrial fibrillation.  2.  Obstructive sleep apnea: CPAP compliance encouraged  3.  Hypertension: Blood pressure significantly elevated.  We Gilmar Bua start Norvasc 5 mg daily.  This can be further titrated by her  primary cardiologist.  4.  Syncope with complete heart block: Status post Medtronic dual-chamber pacemaker implanted 04/24/2019.  Device functioning appropriately.  4.  Secondary hypercoagulable state: Post watchman for atrial fibrillation.   Current medicines are reviewed at length with the patient today.   The patient does  not have concerns regarding her medicines.  The following changes were made today: Start Norvasc  Labs/ tests ordered today include:  Orders Placed This Encounter  Procedures   TSH   Hepatic function panel      Disposition:   FU with Dvora Buitron 6 months  Signed, Sonika Levins Meredith Leeds, MD  08/04/2021 10:56 AM     [facility] [address] [place] [address] [phone] (office) [phone] (fax)

## 2021-08-05 LAB — HEPATIC FUNCTION PANEL
ALT: 16 IU/L (ref 0–32)
AST: 17 IU/L (ref 0–40)
Albumin: 4 g/dL (ref 3.7–4.7)
Alkaline Phosphatase: 97 IU/L (ref 44–121)
Bilirubin Total: 0.2 mg/dL (ref 0.0–1.2)
Bilirubin, Direct: <0.1 mg/dL (ref 0.00–0.40)
Total Protein: 6.6 g/dL (ref 6.0–8.5)

## 2021-08-05 LAB — TSH: TSH: 4.34 u[IU]/mL (ref 0.450–4.500)

## 2021-08-06 DIAGNOSIS — M25552 Pain in left hip: Secondary | ICD-10-CM | POA: Diagnosis not present

## 2021-08-06 DIAGNOSIS — M62552 Muscle wasting and atrophy, not elsewhere classified, left thigh: Secondary | ICD-10-CM | POA: Diagnosis not present

## 2021-08-06 DIAGNOSIS — R2689 Other abnormalities of gait and mobility: Secondary | ICD-10-CM | POA: Diagnosis not present

## 2021-08-06 DIAGNOSIS — Z96652 Presence of left artificial knee joint: Secondary | ICD-10-CM | POA: Diagnosis not present

## 2021-08-07 NOTE — Progress Notes (Signed)
Remote pacemaker transmission.   

## 2021-08-08 DIAGNOSIS — Z96652 Presence of left artificial knee joint: Secondary | ICD-10-CM | POA: Diagnosis not present

## 2021-08-08 DIAGNOSIS — R2689 Other abnormalities of gait and mobility: Secondary | ICD-10-CM | POA: Diagnosis not present

## 2021-08-08 DIAGNOSIS — M62552 Muscle wasting and atrophy, not elsewhere classified, left thigh: Secondary | ICD-10-CM | POA: Diagnosis not present

## 2021-08-08 DIAGNOSIS — M25552 Pain in left hip: Secondary | ICD-10-CM | POA: Diagnosis not present

## 2021-08-11 DIAGNOSIS — M25552 Pain in left hip: Secondary | ICD-10-CM | POA: Diagnosis not present

## 2021-08-11 DIAGNOSIS — R2689 Other abnormalities of gait and mobility: Secondary | ICD-10-CM | POA: Diagnosis not present

## 2021-08-11 DIAGNOSIS — Z96652 Presence of left artificial knee joint: Secondary | ICD-10-CM | POA: Diagnosis not present

## 2021-08-11 DIAGNOSIS — M62552 Muscle wasting and atrophy, not elsewhere classified, left thigh: Secondary | ICD-10-CM | POA: Diagnosis not present

## 2021-08-15 DIAGNOSIS — Z96652 Presence of left artificial knee joint: Secondary | ICD-10-CM | POA: Diagnosis not present

## 2021-08-15 DIAGNOSIS — M62552 Muscle wasting and atrophy, not elsewhere classified, left thigh: Secondary | ICD-10-CM | POA: Diagnosis not present

## 2021-08-15 DIAGNOSIS — R2689 Other abnormalities of gait and mobility: Secondary | ICD-10-CM | POA: Diagnosis not present

## 2021-08-15 DIAGNOSIS — M25552 Pain in left hip: Secondary | ICD-10-CM | POA: Diagnosis not present

## 2021-08-18 ENCOUNTER — Other Ambulatory Visit: Payer: Self-pay | Admitting: Cardiology

## 2021-08-18 DIAGNOSIS — R2689 Other abnormalities of gait and mobility: Secondary | ICD-10-CM | POA: Diagnosis not present

## 2021-08-18 DIAGNOSIS — M25552 Pain in left hip: Secondary | ICD-10-CM | POA: Diagnosis not present

## 2021-08-18 DIAGNOSIS — Z96652 Presence of left artificial knee joint: Secondary | ICD-10-CM | POA: Diagnosis not present

## 2021-08-18 DIAGNOSIS — M62552 Muscle wasting and atrophy, not elsewhere classified, left thigh: Secondary | ICD-10-CM | POA: Diagnosis not present

## 2021-08-21 DIAGNOSIS — Z96652 Presence of left artificial knee joint: Secondary | ICD-10-CM | POA: Diagnosis not present

## 2021-08-21 DIAGNOSIS — M62552 Muscle wasting and atrophy, not elsewhere classified, left thigh: Secondary | ICD-10-CM | POA: Diagnosis not present

## 2021-08-21 DIAGNOSIS — M25552 Pain in left hip: Secondary | ICD-10-CM | POA: Diagnosis not present

## 2021-08-21 DIAGNOSIS — R2689 Other abnormalities of gait and mobility: Secondary | ICD-10-CM | POA: Diagnosis not present

## 2021-08-25 DIAGNOSIS — Z96652 Presence of left artificial knee joint: Secondary | ICD-10-CM | POA: Diagnosis not present

## 2021-08-25 DIAGNOSIS — M62552 Muscle wasting and atrophy, not elsewhere classified, left thigh: Secondary | ICD-10-CM | POA: Diagnosis not present

## 2021-08-25 DIAGNOSIS — R2689 Other abnormalities of gait and mobility: Secondary | ICD-10-CM | POA: Diagnosis not present

## 2021-08-25 DIAGNOSIS — M25552 Pain in left hip: Secondary | ICD-10-CM | POA: Diagnosis not present

## 2021-08-28 DIAGNOSIS — M25552 Pain in left hip: Secondary | ICD-10-CM | POA: Diagnosis not present

## 2021-08-28 DIAGNOSIS — R2689 Other abnormalities of gait and mobility: Secondary | ICD-10-CM | POA: Diagnosis not present

## 2021-08-28 DIAGNOSIS — M62552 Muscle wasting and atrophy, not elsewhere classified, left thigh: Secondary | ICD-10-CM | POA: Diagnosis not present

## 2021-08-28 DIAGNOSIS — Z96652 Presence of left artificial knee joint: Secondary | ICD-10-CM | POA: Diagnosis not present

## 2021-09-01 DIAGNOSIS — Z96652 Presence of left artificial knee joint: Secondary | ICD-10-CM | POA: Diagnosis not present

## 2021-09-01 DIAGNOSIS — M62552 Muscle wasting and atrophy, not elsewhere classified, left thigh: Secondary | ICD-10-CM | POA: Diagnosis not present

## 2021-09-01 DIAGNOSIS — M25552 Pain in left hip: Secondary | ICD-10-CM | POA: Diagnosis not present

## 2021-09-01 DIAGNOSIS — R2689 Other abnormalities of gait and mobility: Secondary | ICD-10-CM | POA: Diagnosis not present

## 2021-09-02 ENCOUNTER — Other Ambulatory Visit: Payer: Self-pay

## 2021-09-02 NOTE — Patient Outreach (Signed)
  Care Coordination   Outreach  Visit Note   09/02/2021 Name: Amanda [name] MRN: [id number] DOB: 04-16-1940  Amanda [name] is a 81 y.o. year old female who sees Street, Sharon Mt, MD for primary care. I spoke with  Amanda [name] by phone today  What matters to the patients health and wellness today?  Reviewed [facility] care coordination program.  Patient reports she is doing well and is not interested. Encouraged patient to inform MD if she changes her mind.    SDOH assessments and interventions completed:   No   Care Coordination Interventions Activated:  No Care Coordination Interventions:  No, not indicated  Follow up plan:  patient refused  Encounter Outcome:  Pt. Refused  Tomasa Rand, RN, BSN, CEN [facility] [employer] [phone]

## 2021-09-04 DIAGNOSIS — Z96652 Presence of left artificial knee joint: Secondary | ICD-10-CM | POA: Diagnosis not present

## 2021-09-04 DIAGNOSIS — M62552 Muscle wasting and atrophy, not elsewhere classified, left thigh: Secondary | ICD-10-CM | POA: Diagnosis not present

## 2021-09-04 DIAGNOSIS — M25552 Pain in left hip: Secondary | ICD-10-CM | POA: Diagnosis not present

## 2021-09-04 DIAGNOSIS — R2689 Other abnormalities of gait and mobility: Secondary | ICD-10-CM | POA: Diagnosis not present

## 2021-09-08 DIAGNOSIS — M25552 Pain in left hip: Secondary | ICD-10-CM | POA: Diagnosis not present

## 2021-09-08 DIAGNOSIS — Z96652 Presence of left artificial knee joint: Secondary | ICD-10-CM | POA: Diagnosis not present

## 2021-09-08 DIAGNOSIS — R2689 Other abnormalities of gait and mobility: Secondary | ICD-10-CM | POA: Diagnosis not present

## 2021-09-08 DIAGNOSIS — M62552 Muscle wasting and atrophy, not elsewhere classified, left thigh: Secondary | ICD-10-CM | POA: Diagnosis not present

## 2021-09-11 DIAGNOSIS — Z96652 Presence of left artificial knee joint: Secondary | ICD-10-CM | POA: Diagnosis not present

## 2021-09-11 DIAGNOSIS — M25552 Pain in left hip: Secondary | ICD-10-CM | POA: Diagnosis not present

## 2021-09-11 DIAGNOSIS — R2689 Other abnormalities of gait and mobility: Secondary | ICD-10-CM | POA: Diagnosis not present

## 2021-09-11 DIAGNOSIS — M62552 Muscle wasting and atrophy, not elsewhere classified, left thigh: Secondary | ICD-10-CM | POA: Diagnosis not present

## 2021-09-16 DIAGNOSIS — Z96652 Presence of left artificial knee joint: Secondary | ICD-10-CM | POA: Diagnosis not present

## 2021-09-16 DIAGNOSIS — R2689 Other abnormalities of gait and mobility: Secondary | ICD-10-CM | POA: Diagnosis not present

## 2021-09-16 DIAGNOSIS — M62552 Muscle wasting and atrophy, not elsewhere classified, left thigh: Secondary | ICD-10-CM | POA: Diagnosis not present

## 2021-09-16 DIAGNOSIS — M25552 Pain in left hip: Secondary | ICD-10-CM | POA: Diagnosis not present

## 2021-09-19 DIAGNOSIS — M62552 Muscle wasting and atrophy, not elsewhere classified, left thigh: Secondary | ICD-10-CM | POA: Diagnosis not present

## 2021-09-19 DIAGNOSIS — M25552 Pain in left hip: Secondary | ICD-10-CM | POA: Diagnosis not present

## 2021-09-19 DIAGNOSIS — Z96652 Presence of left artificial knee joint: Secondary | ICD-10-CM | POA: Diagnosis not present

## 2021-09-19 DIAGNOSIS — R2689 Other abnormalities of gait and mobility: Secondary | ICD-10-CM | POA: Diagnosis not present

## 2021-09-23 DIAGNOSIS — E1122 Type 2 diabetes mellitus with diabetic chronic kidney disease: Secondary | ICD-10-CM | POA: Diagnosis not present

## 2021-09-23 DIAGNOSIS — N184 Chronic kidney disease, stage 4 (severe): Secondary | ICD-10-CM | POA: Diagnosis not present

## 2021-09-23 DIAGNOSIS — I129 Hypertensive chronic kidney disease with stage 1 through stage 4 chronic kidney disease, or unspecified chronic kidney disease: Secondary | ICD-10-CM | POA: Diagnosis not present

## 2021-09-23 DIAGNOSIS — E669 Obesity, unspecified: Secondary | ICD-10-CM | POA: Diagnosis not present

## 2021-09-26 DIAGNOSIS — E113292 Type 2 diabetes mellitus with mild nonproliferative diabetic retinopathy without macular edema, left eye: Secondary | ICD-10-CM | POA: Diagnosis not present

## 2021-09-26 DIAGNOSIS — H353211 Exudative age-related macular degeneration, right eye, with active choroidal neovascularization: Secondary | ICD-10-CM | POA: Diagnosis not present

## 2021-09-26 DIAGNOSIS — H353131 Nonexudative age-related macular degeneration, bilateral, early dry stage: Secondary | ICD-10-CM | POA: Diagnosis not present

## 2021-09-26 DIAGNOSIS — E113211 Type 2 diabetes mellitus with mild nonproliferative diabetic retinopathy with macular edema, right eye: Secondary | ICD-10-CM | POA: Diagnosis not present

## 2021-09-26 DIAGNOSIS — H353121 Nonexudative age-related macular degeneration, left eye, early dry stage: Secondary | ICD-10-CM | POA: Diagnosis not present

## 2021-09-29 DIAGNOSIS — H353211 Exudative age-related macular degeneration, right eye, with active choroidal neovascularization: Secondary | ICD-10-CM | POA: Diagnosis not present

## 2021-10-13 ENCOUNTER — Other Ambulatory Visit: Payer: Self-pay | Admitting: Cardiology

## 2021-10-14 ENCOUNTER — Other Ambulatory Visit (HOSPITAL_COMMUNITY): Payer: Self-pay

## 2021-10-14 ENCOUNTER — Other Ambulatory Visit: Payer: Self-pay

## 2021-10-14 ENCOUNTER — Ambulatory Visit: Payer: Medicare Other | Attending: Cardiology | Admitting: Cardiology

## 2021-10-14 ENCOUNTER — Encounter: Payer: Self-pay | Admitting: Cardiology

## 2021-10-14 VITALS — BP 126/78 | HR 62 | Ht 63.0 in | Wt 181.2 lb

## 2021-10-14 DIAGNOSIS — I442 Atrioventricular block, complete: Secondary | ICD-10-CM | POA: Diagnosis not present

## 2021-10-14 DIAGNOSIS — G4733 Obstructive sleep apnea (adult) (pediatric): Secondary | ICD-10-CM

## 2021-10-14 DIAGNOSIS — Z9989 Dependence on other enabling machines and devices: Secondary | ICD-10-CM

## 2021-10-14 DIAGNOSIS — N184 Chronic kidney disease, stage 4 (severe): Secondary | ICD-10-CM

## 2021-10-14 DIAGNOSIS — I4819 Other persistent atrial fibrillation: Secondary | ICD-10-CM | POA: Diagnosis not present

## 2021-10-14 DIAGNOSIS — I129 Hypertensive chronic kidney disease with stage 1 through stage 4 chronic kidney disease, or unspecified chronic kidney disease: Secondary | ICD-10-CM

## 2021-10-14 DIAGNOSIS — I2510 Atherosclerotic heart disease of native coronary artery without angina pectoris: Secondary | ICD-10-CM

## 2021-10-14 DIAGNOSIS — E1122 Type 2 diabetes mellitus with diabetic chronic kidney disease: Secondary | ICD-10-CM

## 2021-10-14 DIAGNOSIS — D6869 Other thrombophilia: Secondary | ICD-10-CM

## 2021-10-14 DIAGNOSIS — Z95818 Presence of other cardiac implants and grafts: Secondary | ICD-10-CM

## 2021-10-14 NOTE — Progress Notes (Signed)
Cardiology Office Note:    Date:  10/14/2021   ID:  Amanda [name], DOB 03/10/1940, MRN [id number]  PCP:  Street, Sharon Mt, MD  Cardiologist:  Jenne Campus, MD    Referring MD: Street, Sharon Mt, *   Chief Complaint  Patient presents with   Follow-up  Doing fine  History of Present Illness:    Amanda [name] is a 81 y.o. female with past medical history significant for paroxysmal atrial fibrillation, coronary artery disease, status post PTCA and stenting to mid RCA in 2018, history of CVA, she is not anticoagulated because of frequent GI bleed however she does have watchman device, chronic kidney failure with creatinine of 1.7, diabetes, essential hypertension, obstructive sleep apnea. Recently she ended up going back to atrial fibrillation.  She did have cardioversion done again in April 14.   She comes today to my office for follow-up.  Overall she is doing well.  She denies having any chest pain tightness squeezing pressure burning chest no palpitations dizziness  Past Medical History:  Diagnosis Date   Acute ischemic stroke (HCC)    Acute metabolic encephalopathy    Acute on chronic systolic (congestive) heart failure (HCC)    Acute renal insufficiency 09/02/2016   Anemia due to stage 4 chronic kidney disease ([place]) 04/04/2015   Atrial fibrillation (HCC)    Benign hypertension with CKD (chronic kidney disease) stage IV ([place]) 04/04/2015   Bradycardia 09/17/2014   Chest pain 06/23/2016   Overview:  Added automatically from request for surgery [id number]   Chronic anemia    Chronic kidney disease (CKD), stage III (moderate) ([place])    Coronary artery disease 10/12/2016   Non drug-eluting stent implanted in June 2018 to mid RCA   08/08/2016 10:37  Angiographic Findings  Cardiac Arteries and Lesion Findings LMCA: 0% and Normal. LAD: 0% and Normal. RCA:   Lesion on Mid RCA: Mid subsection.95% stenosis reduced to 0%. Pre procedure   TIMI II flow was noted. Post Procedure TIMI III  flow was present. Poor run   off was present. The lesion was diagnosed as High Risk (C).    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 ([place]) 04/04/2015   Diabetes mellitus without complication ([place])    Diverticulosis    Dyslipidemia 09/17/2014   Gastroesophageal reflux    GI bleed 08/06/2016   Heart block AV complete ([place]) 09/05/2019   History of cardiac monitoring 02/23/2018   monitor inserted   History of iron deficiency anemia 12/28/2017   Hyperlipidemia    Hypertensive heart disease with heart failure (HCC)    Iron deficiency anemia 12/28/2017   LV dysfunction 09/02/2016   Myocardial infarction [facility])    NSTEMI (non-ST elevated myocardial infarction) ([place]) 08/06/2016   Obstructive sleep apnea 09/17/2014   Orthostatic hypotension 12/28/2017   OSA on CPAP    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   Postural dizziness with presyncope 09/23/2017   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/24/2017   Recurrent left pleural effusion 03/14/2015   Renal failure, chronic, stage 3 (moderate) ([place])    Syncope 32/07/7122   Systolic heart failure ([place])    Thyroid disease    Vitamin D deficiency 04/04/2015    Past Surgical History:  Procedure Laterality Date   BUBBLE STUDY  05/16/2020   Procedure: BUBBLE STUDY;  Surgeon: Elouise Munroe, MD;  Location: [facility];  Service: Cardiovascular;;   CARDIAC CATHETERIZATION     CARDIOVERSION N/A 05/16/2020   Procedure: CARDIOVERSION;  Surgeon: Elouise Munroe, MD;  Location: [facility] ENDOSCOPY;  Service: Cardiovascular;  Laterality: N/A;   CARDIOVERSION N/A 09/06/2020   Procedure: CARDIOVERSION;  Surgeon: Donato Heinz, MD;  Location: [facility];  Service: Cardiovascular;  Laterality: N/A;   CARDIOVERSION N/A 05/16/2021   Procedure: CARDIOVERSION;  Surgeon: Sueanne Margarita, MD;  Location: [facility] ENDOSCOPY;  Service: Cardiovascular;  Laterality: N/A;   COLONOSCOPY  11/21/2014   Moderate predominantly sigmoid  diverticulosis. Otherwise noraml collonscopy to TI.    CORONARY ANGIOPLASTY WITH STENT PLACEMENT  08/2016   EXCISIONAL HEMORRHOIDECTOMY     LEFT ATRIAL APPENDAGE OCCLUSION  11/2016   in [place] N/A 02/23/2018   Procedure: [place];  Surgeon: Constance Haw, MD;  Location: [place] CV LAB;  Service: Cardiovascular;  Laterality: N/A;   LOOP RECORDER REMOVAL N/A 04/24/2019   Procedure: LOOP RECORDER REMOVAL;  Surgeon: Constance Haw, MD;  Location: [place] CV LAB;  Service: Cardiovascular;  Laterality: N/A;   NOSE SURGERY     PACEMAKER IMPLANT N/A 04/24/2019   Procedure: PACEMAKER IMPLANT;  Surgeon: Constance Haw, MD;  Location: [place] CV LAB;  Service: Cardiovascular;  Laterality: N/A;   TEE WITHOUT CARDIOVERSION N/A 05/16/2020   Procedure: TRANSESOPHAGEAL ECHOCARDIOGRAM (TEE);  Surgeon: Elouise Munroe, MD;  Location: [facility] ENDOSCOPY;  Service: Cardiovascular;  Laterality: N/A;   TOTAL HIP REVISION Left 03/25/2021   Procedure: LEFT POSTERIOR TOTAL HIP  ZIMMER CABLES;  Surgeon: Paralee Cancel, MD;  Location: [facility] ORS;  Service: Orthopedics;  Laterality: Left;    Current Medications: Current Meds  Medication Sig   albuterol (VENTOLIN HFA) 108 (90 Base) MCG/ACT inhaler Inhale 2 puffs into the lungs every 6 (six) hours as needed for wheezing or shortness of breath.   amiodarone (PACERONE) 200 MG tablet TAKE 1 TABLET BY MOUTH DAILY. (Patient taking differently: Take 200 mg by mouth daily.)   amLODipine (NORVASC) 5 MG tablet Take 1 tablet (5 mg total) by mouth daily.   BIOTIN PO Take 1,000 mcg by mouth 2 (two) times daily.   blood glucose meter kit and supplies KIT Dispense based on patient and insurance preference. Use up to four times daily as directed. (Patient taking differently: Inject 1 each into the skin as directed. Dispense based on patient and insurance preference. Use up to four times daily as directed.)    carboxymethylcellul-glycerin (REFRESH RELIEVA) 0.5-0.9 % ophthalmic solution Place 1 drop into both eyes daily as needed for dry eyes. VE   Cholecalciferol (VITAMIN D3) 50 MCG (2000 UT) TABS Take 2,000 Units by mouth daily at 12 noon.   Coenzyme Q10 100 MG TABS Take 100 mg by mouth daily at 12 noon.    colchicine 0.6 MG tablet Take 0.6 mg by mouth daily as needed (gout). For gout flare ups   denosumab (PROLIA) 60 MG/ML SOSY injection Inject 60 mg into the skin every 6 (six) months.   docusate sodium (COLACE) 100 MG capsule Take 100 mg by mouth 2 (two) times daily.   estradiol (ESTRACE) 0.1 MG/GM vaginal cream Place 1 Applicatorful vaginally every Monday, Wednesday, and Friday.    Evolocumab (REPATHA SURECLICK) 295 MG/ML SOAJ Inject 140 mg into the skin every 14 (fourteen) days.   ezetimibe (ZETIA) 10 MG tablet Take 1 tablet (10 mg total) by mouth every evening.   famotidine (PEPCID) 40 MG tablet Take 40 mg by mouth daily.   febuxostat (ULORIC) 40 MG tablet Take 40 mg by mouth at  bedtime.   feeding supplement, GLUCERNA SHAKE, (GLUCERNA SHAKE) LIQD Take 237 mLs by mouth 3 (three) times daily between meals. (Patient taking differently: Take 237 mLs by mouth daily.)   ferrous sulfate 325 (65 FE) MG EC tablet Take 325 mg by mouth 2 (two) times daily with breakfast and lunch.   folic acid (FOLVITE) 518 MCG tablet Take 800 mcg by mouth daily at 12 noon.   furosemide (LASIX) 20 MG tablet Take 40 mg by mouth 2 (two) times daily with breakfast and lunch.   Glucosamine-Chondroitin (OSTEO BI-FLEX REGULAR STRENGTH PO) Take 25 mcg by mouth daily at 12 noon. Plus D3   hydrALAZINE (APRESOLINE) 50 MG tablet Take 1 tablet (50 mg total) by mouth every 6 (six) hours.   insulin glargine (LANTUS) 100 UNIT/ML Solostar Pen Inject 3 Units into the skin daily.   isosorbide mononitrate (IMDUR) 30 MG 24 hr tablet Take 1 tablet (30 mg total) by mouth daily.   Lactobacillus (FLORAJEN ACIDOPHILUS) CAPS Take 1 capsule by mouth  daily at 12 noon. 390 mg each   levothyroxine (SYNTHROID, LEVOTHROID) 50 MCG tablet Take 50 mcg by mouth daily before breakfast.   Multiple Vitamins-Minerals (PRESERVISION AREDS 2) CAPS Take 1 capsule by mouth 2 (two) times daily.   NITROGLYCERIN PO Take 0.4 mg by mouth daily as needed (Chest pain). For chest pain   omega-3 acid ethyl esters (LOVAZA) 1 g capsule Take 2 capsules (2 g total) by mouth daily at 12 noon.   pantoprazole (PROTONIX) 40 MG tablet Take 1 tablet (40 mg total) by mouth daily.   polyethylene glycol powder (GLYCOLAX/MIRALAX) powder Take 17 g by mouth every other day. Alternating between miralax and benefiber   pravastatin (PRAVACHOL) 40 MG tablet Take 40 mg by mouth at bedtime.   ranolazine (RANEXA) 500 MG 12 hr tablet Take 1 tablet (500 mg total) by mouth 2 (two) times daily.   vitamin B-12 (CYANOCOBALAMIN) 1000 MCG tablet Take 1,000 mcg by mouth daily at 12 noon.   Wheat Dextrin (BENEFIBER DRINK MIX PO) Take 1 Scoop by mouth daily. Mix 1 capful with beverage and drink every other day   zolpidem (AMBIEN) 5 MG tablet Take 5 mg by mouth at bedtime as needed for sleep.   [DISCONTINUED] metoprolol tartrate (LOPRESSOR) 25 MG tablet Take 0.5 tablets (12.5 mg total) by mouth 2 (two) times daily.     Allergies:   Ciprofloxacin, Contrast media [iodinated contrast media], Nsaids, and Statins   Social History   Socioeconomic History   Marital status: Married    Spouse name: Not on file   Number of children: 2   Years of education: Not on file   Highest education level: Not on file  Occupational History   Occupation: Retired   Tobacco Use   Smoking status: Former   Smokeless tobacco: Former    Quit date: 1997   Tobacco comments:    Former smoker 05/22/21  Vaping Use   Vaping Use: Never used  Substance and Sexual Activity   Alcohol use: Not Currently    Alcohol/week: 2.0 standard drinks of alcohol    Types: 2 Standard drinks or equivalent per week   Drug use: No    Sexual activity: Not on file  Other Topics Concern   Not on file  Social History Narrative   Not on file   Social Determinants of Health   Financial Resource Strain: Not on file  Food Insecurity: Not on file  Transportation Needs: Not on file  Physical  Activity: Not on file  Stress: Not on file  Social Connections: Not on file     Family History: The patient's family history includes Breast cancer in her mother; Cancer in her maternal grandfather; Diabetes in her sister; Heart attack in her brother, maternal uncle, and sister; Hypertension in her father; Prostate cancer in her father; Stroke in her father. There is no history of Esophageal cancer. ROS:   Please see the history of present illness.    All 14 point review of systems negative except as described per history of present illness  EKGs/Labs/Other Studies Reviewed:      Recent Labs: 03/31/2021: B Natriuretic Peptide 789.8 05/16/2021: BUN 46; Creatinine, Ser 1.93; Hemoglobin 8.9; Platelets 270; Potassium 4.5; Sodium 140 08/04/2021: ALT 16; TSH 4.340  Recent Lipid Panel    Component Value Date/Time   CHOL 125 09/05/2019 0842   TRIG 162 (H) 09/05/2019 0842   HDL 59 09/05/2019 0842   CHOLHDL 2.1 09/05/2019 0842   LDLCALC 39 09/05/2019 0842    Physical Exam:    VS:  BP 126/78 (BP Location: Left Arm, Patient Position: Sitting)   Pulse 62   Ht $R'5\' 3"'XD$  (1.6 m)   Wt 181 lb 3.2 oz (82.2 kg)   SpO2 98%   BMI 32.10 kg/m     Wt Readings from Last 3 Encounters:  10/14/21 181 lb 3.2 oz (82.2 kg)  08/04/21 179 lb (81.2 kg)  06/06/21 175 lb 3.2 oz (79.5 kg)     GEN:  Well nourished, well developed in no acute distress HEENT: Normal NECK: No JVD; No carotid bruits LYMPHATICS: No lymphadenopathy CARDIAC: RRR, no murmurs, no rubs, no gallops RESPIRATORY:  Clear to auscultation without rales, wheezing or rhonchi  ABDOMEN: Soft, non-tender, non-distended MUSCULOSKELETAL:  No edema; No deformity  SKIN: Warm and dry LOWER  EXTREMITIES: no swelling NEUROLOGIC:  Alert and oriented x 3 PSYCHIATRIC:  Normal affect   ASSESSMENT:    1. Persistent atrial fibrillation ([place])   2. Coronary artery disease involving native coronary artery of native heart without angina pectoris   3. Heart block AV complete ([place])   4. Benign hypertension with CKD (chronic kidney disease) stage IV ([place])   5. 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 OSA on CPAP   7. Secondary hypercoagulable state ([place])   8. Presence of Watchman left atrial appendage closure device    PLAN:    In order of problems listed above:  Paroxysmal atrial fibrillation.  She maintained sinus rhythm today on EKG also interrogation of the device today look at it showed sinus rhythm since last cardioversion.  Continue antiplatelet therapy which includes clued amiodarone.  Recently her TSH has been checked which is normal of 4.3 this is from reviewing K PN, her liver function tests are also normal.  We will continue high risk medications Coronary disease asymptomatic denies have any problems Diabetes that being followed by internal medicine team.  I did review K PN which show me her hemoglobin A1c of 6.3% Presence of Watchman device.  Noted Dyslipidemia I did review K PN which show me total cholesterol 121 and HDL 55 Overall Amanda [name] is doing well I continue present medications see him back in 6 months   Medication Adjustments/Labs and Tests Ordered: Current medicines are reviewed at length with the patient today.  Concerns regarding medicines are outlined above.  Orders Placed This Encounter  Procedures   EKG 12-Lead   Medication changes: No orders  of the defined types were placed in this encounter.   Signed, Park Liter, MD, [facility] 10/14/2021 4:53 PM    [facility]

## 2021-10-14 NOTE — Patient Instructions (Signed)

## 2021-10-20 ENCOUNTER — Ambulatory Visit (INDEPENDENT_AMBULATORY_CARE_PROVIDER_SITE_OTHER): Payer: Medicare Other

## 2021-10-20 DIAGNOSIS — I442 Atrioventricular block, complete: Secondary | ICD-10-CM

## 2021-10-21 LAB — CUP PACEART REMOTE DEVICE CHECK
Battery Remaining Longevity: 150 mo
Battery Voltage: 3.03 V
Brady Statistic AP VP Percent: 0 %
Brady Statistic AP VS Percent: 1.41 %
Brady Statistic AS VP Percent: 0.04 %
Brady Statistic AS VS Percent: 98.55 %
Brady Statistic RA Percent Paced: 1.4 %
Brady Statistic RV Percent Paced: 0.04 %
Date Time Interrogation Session: 20230918104339
Implantable Lead Implant Date: 20210322
Implantable Lead Implant Date: 20210322
Implantable Lead Location: 753859
Implantable Lead Location: 753860
Implantable Lead Model: 5076
Implantable Lead Model: 5076
Implantable Pulse Generator Implant Date: 20210322
Lead Channel Impedance Value: 323 Ohm
Lead Channel Impedance Value: 361 Ohm
Lead Channel Impedance Value: 418 Ohm
Lead Channel Impedance Value: 418 Ohm
Lead Channel Pacing Threshold Amplitude: 0.75 V
Lead Channel Pacing Threshold Amplitude: 0.75 V
Lead Channel Pacing Threshold Pulse Width: 0.4 ms
Lead Channel Pacing Threshold Pulse Width: 0.4 ms
Lead Channel Sensing Intrinsic Amplitude: 3.75 mV
Lead Channel Sensing Intrinsic Amplitude: 3.75 mV
Lead Channel Sensing Intrinsic Amplitude: 9.375 mV
Lead Channel Sensing Intrinsic Amplitude: 9.375 mV
Lead Channel Setting Pacing Amplitude: 1.5 V
Lead Channel Setting Pacing Amplitude: 2.5 V
Lead Channel Setting Pacing Pulse Width: 0.4 ms
Lead Channel Setting Sensing Sensitivity: 2 mV

## 2021-10-27 DIAGNOSIS — N184 Chronic kidney disease, stage 4 (severe): Secondary | ICD-10-CM | POA: Diagnosis not present

## 2021-10-27 DIAGNOSIS — E1122 Type 2 diabetes mellitus with diabetic chronic kidney disease: Secondary | ICD-10-CM | POA: Diagnosis not present

## 2021-10-30 DIAGNOSIS — H6123 Impacted cerumen, bilateral: Secondary | ICD-10-CM | POA: Diagnosis not present

## 2021-10-30 DIAGNOSIS — H353211 Exudative age-related macular degeneration, right eye, with active choroidal neovascularization: Secondary | ICD-10-CM | POA: Diagnosis not present

## 2021-10-30 DIAGNOSIS — E113211 Type 2 diabetes mellitus with mild nonproliferative diabetic retinopathy with macular edema, right eye: Secondary | ICD-10-CM | POA: Diagnosis not present

## 2021-10-30 DIAGNOSIS — H353121 Nonexudative age-related macular degeneration, left eye, early dry stage: Secondary | ICD-10-CM | POA: Diagnosis not present

## 2021-10-31 NOTE — Progress Notes (Signed)
Remote pacemaker transmission.   

## 2021-11-01 DIAGNOSIS — E1122 Type 2 diabetes mellitus with diabetic chronic kidney disease: Secondary | ICD-10-CM | POA: Diagnosis not present

## 2021-11-01 DIAGNOSIS — I5022 Chronic systolic (congestive) heart failure: Secondary | ICD-10-CM | POA: Diagnosis not present

## 2021-11-01 DIAGNOSIS — E039 Hypothyroidism, unspecified: Secondary | ICD-10-CM | POA: Diagnosis not present

## 2021-11-17 ENCOUNTER — Other Ambulatory Visit: Payer: Self-pay | Admitting: Cardiology

## 2021-11-27 DIAGNOSIS — E785 Hyperlipidemia, unspecified: Secondary | ICD-10-CM | POA: Diagnosis not present

## 2021-11-27 DIAGNOSIS — E1122 Type 2 diabetes mellitus with diabetic chronic kidney disease: Secondary | ICD-10-CM | POA: Diagnosis not present

## 2021-11-27 DIAGNOSIS — N184 Chronic kidney disease, stage 4 (severe): Secondary | ICD-10-CM | POA: Diagnosis not present

## 2021-11-27 DIAGNOSIS — G720 Drug-induced myopathy: Secondary | ICD-10-CM | POA: Diagnosis not present

## 2021-11-27 DIAGNOSIS — H353211 Exudative age-related macular degeneration, right eye, with active choroidal neovascularization: Secondary | ICD-10-CM | POA: Diagnosis not present

## 2021-11-27 DIAGNOSIS — H353121 Nonexudative age-related macular degeneration, left eye, early dry stage: Secondary | ICD-10-CM | POA: Diagnosis not present

## 2021-11-28 DIAGNOSIS — N184 Chronic kidney disease, stage 4 (severe): Secondary | ICD-10-CM | POA: Diagnosis not present

## 2021-11-28 DIAGNOSIS — E1122 Type 2 diabetes mellitus with diabetic chronic kidney disease: Secondary | ICD-10-CM | POA: Diagnosis not present

## 2021-12-02 DIAGNOSIS — E1122 Type 2 diabetes mellitus with diabetic chronic kidney disease: Secondary | ICD-10-CM | POA: Diagnosis not present

## 2021-12-02 DIAGNOSIS — I5022 Chronic systolic (congestive) heart failure: Secondary | ICD-10-CM | POA: Diagnosis not present

## 2021-12-02 DIAGNOSIS — E039 Hypothyroidism, unspecified: Secondary | ICD-10-CM | POA: Diagnosis not present

## 2021-12-10 DIAGNOSIS — M810 Age-related osteoporosis without current pathological fracture: Secondary | ICD-10-CM | POA: Diagnosis not present

## 2021-12-22 ENCOUNTER — Telehealth: Payer: Self-pay | Admitting: Cardiology

## 2021-12-22 DIAGNOSIS — R0609 Other forms of dyspnea: Secondary | ICD-10-CM

## 2021-12-22 NOTE — Telephone Encounter (Signed)
LVM regarding message left earlier.

## 2021-12-22 NOTE — Telephone Encounter (Signed)
Pt c/o Shortness Of Breath: STAT if SOB developed within the last 24 hours or pt is noticeably SOB on the phone  1. Are you currently SOB (can you hear that pt is SOB on the phone)?  No   2. How long have you been experiencing SOB?  Past few days  3. Are you SOB when sitting or when up moving around?  When up and moving around and  laying down   4. Are you currently experiencing any other symptoms?  Patient states her o2 has been very low for the past few days Yesterday her o2 dropped to 88

## 2021-12-22 NOTE — Addendum Note (Signed)
Addended by: Jacobo Forest D on: 12/22/2021 05:18 PM   Modules accepted: Orders

## 2021-12-22 NOTE — Telephone Encounter (Signed)
Spoke with pt and advised per Dr. Agustin Cree to take one extra Lasix today and come tomorrow for BMP and ProBNP. Pt agreed and verbalized understanding.

## 2021-12-22 NOTE — Telephone Encounter (Signed)
Spoke with pt. She reported over the past few days she is having shortness of breath when up walking and lying down. She has a wt increase of 5 lbs over the past week. Her O2 has ranged from 88-92%. She takes Lasix 40mg  BID. She denied any chest pain or other concerns.

## 2021-12-23 DIAGNOSIS — R0609 Other forms of dyspnea: Secondary | ICD-10-CM | POA: Diagnosis not present

## 2021-12-24 ENCOUNTER — Telehealth: Payer: Self-pay | Admitting: Cardiology

## 2021-12-24 LAB — BASIC METABOLIC PANEL
BUN/Creatinine Ratio: 21 (ref 12–28)
BUN: 36 mg/dL — ABNORMAL HIGH (ref 8–27)
CO2: 24 mmol/L (ref 20–29)
Calcium: 9.9 mg/dL (ref 8.7–10.3)
Chloride: 108 mmol/L — ABNORMAL HIGH (ref 96–106)
Creatinine, Ser: 1.75 mg/dL — ABNORMAL HIGH (ref 0.57–1.00)
Glucose: 177 mg/dL — ABNORMAL HIGH (ref 70–99)
Potassium: 4.8 mmol/L (ref 3.5–5.2)
Sodium: 144 mmol/L (ref 134–144)
eGFR: 29 mL/min/{1.73_m2} — ABNORMAL LOW (ref >59)

## 2021-12-24 LAB — PRO B NATRIURETIC PEPTIDE: NT-Pro BNP: 2895 pg/mL — ABNORMAL HIGH (ref 0–738)

## 2021-12-24 NOTE — Telephone Encounter (Signed)
Pt returning nurses all regarding results. Please advise

## 2021-12-24 NOTE — Telephone Encounter (Signed)
Spoke with pt about lab results. Dr. Agustin Cree recommended that she increase Lasix to 60mg  BID for 3-4 days. She agreed to call next week to let us know how she is doing,.

## 2021-12-31 ENCOUNTER — Telehealth: Payer: Self-pay | Admitting: Cardiology

## 2021-12-31 DIAGNOSIS — R0609 Other forms of dyspnea: Secondary | ICD-10-CM

## 2021-12-31 NOTE — Telephone Encounter (Signed)
Pt c/o medication issue:  1. Name of Medication:    2. How are you currently taking this medication (dosage and times per day)?   3. Are you having a reaction (difficulty breathing--STAT)?   4. What is your medication issue?   Courtney with [facility] states the patient's cholesterol dropped significantly the last time they checked it. She states back in June patients LDL was at 48 and triglycerides were at 88. She would like to know if Dr. Agustin Cree will agree with decreasing one of patient's cholesterol medications.

## 2021-12-31 NOTE — Telephone Encounter (Signed)
Pt c/o swelling: STAT is pt has developed SOB within 24 hours  How much weight have you gained and in what time span?   If swelling, where is the swelling located? No swelling noticed   Are you currently taking a fluid pill? Yes  Are you currently SOB? No  Do you have a log of your daily weights (if so, list)? 11/19 - 184 lbs  11/20 - 175 lbs 11/28 - 178 lbs 11/29 - 180 lbs  Have you gained 3 pounds in a day or 5 pounds in a week? 3 lbs in a day   Have you traveled recently? No    Pt states she was told to call if her weight increased again.

## 2022-01-01 DIAGNOSIS — E1122 Type 2 diabetes mellitus with diabetic chronic kidney disease: Secondary | ICD-10-CM | POA: Diagnosis not present

## 2022-01-01 DIAGNOSIS — I5022 Chronic systolic (congestive) heart failure: Secondary | ICD-10-CM | POA: Diagnosis not present

## 2022-01-01 DIAGNOSIS — E039 Hypothyroidism, unspecified: Secondary | ICD-10-CM | POA: Diagnosis not present

## 2022-01-02 NOTE — Telephone Encounter (Signed)
Unable to talk with pt. Left My Chart message and VM regarding Dr. Wendy Poet recommendations. Will call pt on Monday to make sure she understands the message.

## 2022-01-02 NOTE — Addendum Note (Signed)
Addended by: Jacobo Forest D on: 01/02/2022 05:07 PM   Modules accepted: Orders

## 2022-01-05 ENCOUNTER — Other Ambulatory Visit: Payer: Self-pay | Admitting: Cardiology

## 2022-01-05 NOTE — Telephone Encounter (Signed)
Spoke with pt about message left on Friday regarding her Lasix dose change. Pt verbalized understanding and stated that she would come for blood work as per Dr. Wendy Poet note.

## 2022-01-07 DIAGNOSIS — R0609 Other forms of dyspnea: Secondary | ICD-10-CM | POA: Diagnosis not present

## 2022-01-08 DIAGNOSIS — H43813 Vitreous degeneration, bilateral: Secondary | ICD-10-CM | POA: Diagnosis not present

## 2022-01-08 DIAGNOSIS — H353122 Nonexudative age-related macular degeneration, left eye, intermediate dry stage: Secondary | ICD-10-CM | POA: Diagnosis not present

## 2022-01-08 DIAGNOSIS — E113292 Type 2 diabetes mellitus with mild nonproliferative diabetic retinopathy without macular edema, left eye: Secondary | ICD-10-CM | POA: Diagnosis not present

## 2022-01-08 DIAGNOSIS — E113211 Type 2 diabetes mellitus with mild nonproliferative diabetic retinopathy with macular edema, right eye: Secondary | ICD-10-CM | POA: Diagnosis not present

## 2022-01-09 LAB — PRO B NATRIURETIC PEPTIDE: NT-Pro BNP: 1181 pg/mL — ABNORMAL HIGH (ref 0–738)

## 2022-01-09 LAB — BASIC METABOLIC PANEL
BUN/Creatinine Ratio: 18 (ref 12–28)
BUN: 38 mg/dL — ABNORMAL HIGH (ref 8–27)
CO2: 23 mmol/L (ref 20–29)
Calcium: 9.5 mg/dL (ref 8.7–10.3)
Chloride: 102 mmol/L (ref 96–106)
Creatinine, Ser: 2.13 mg/dL — ABNORMAL HIGH (ref 0.57–1.00)
Glucose: 180 mg/dL — ABNORMAL HIGH (ref 70–99)
Potassium: 4.5 mmol/L (ref 3.5–5.2)
Sodium: 140 mmol/L (ref 134–144)
eGFR: 23 mL/min/{1.73_m2} — ABNORMAL LOW (ref >59)

## 2022-01-12 ENCOUNTER — Other Ambulatory Visit: Payer: Self-pay | Admitting: Cardiology

## 2022-01-12 NOTE — Telephone Encounter (Signed)
Rx refill sent to pharmacy. 

## 2022-01-15 ENCOUNTER — Other Ambulatory Visit: Payer: Self-pay

## 2022-01-15 ENCOUNTER — Telehealth: Payer: Self-pay | Admitting: Cardiology

## 2022-01-15 ENCOUNTER — Telehealth: Payer: Self-pay

## 2022-01-15 DIAGNOSIS — N289 Disorder of kidney and ureter, unspecified: Secondary | ICD-10-CM

## 2022-01-15 NOTE — Telephone Encounter (Signed)
Patient notified of results and will stop by Monday morning. Lab order on file.

## 2022-01-15 NOTE — Telephone Encounter (Signed)
Called Amy at [facility] and clarified the patients Furosemide dose. Amy had no further questions at this time.

## 2022-01-15 NOTE — Telephone Encounter (Signed)
Pt c/o medication issue:  1. Name of Medication:   furosemide (LASIX) 20 MG tablet    2. How are you currently taking this medication (dosage and times per day)?   3. Are you having a reaction (difficulty breathing--STAT)?   4. What is your medication issue?   Amy with [facility] is calling to clarify instructions. Patient informed them that her Furosemide has been increased.

## 2022-01-15 NOTE — Telephone Encounter (Signed)
-----   Message from Park Liter, MD sent at 01/14/2022 12:47 PM EST ----- Blood work indicate improvement in congestive heart failure but worsening of the kidney function.  She is to have a kidney function done at the beginning beginning of next week meaning Chem-7

## 2022-01-19 ENCOUNTER — Ambulatory Visit (INDEPENDENT_AMBULATORY_CARE_PROVIDER_SITE_OTHER): Payer: Medicare Other

## 2022-01-19 DIAGNOSIS — I442 Atrioventricular block, complete: Secondary | ICD-10-CM

## 2022-01-19 DIAGNOSIS — N289 Disorder of kidney and ureter, unspecified: Secondary | ICD-10-CM | POA: Diagnosis not present

## 2022-01-20 LAB — CUP PACEART REMOTE DEVICE CHECK
Battery Remaining Longevity: 147 mo
Battery Voltage: 3.03 V
Brady Statistic AP VP Percent: 0.01 %
Brady Statistic AP VS Percent: 3.51 %
Brady Statistic AS VP Percent: 0.04 %
Brady Statistic AS VS Percent: 96.44 %
Brady Statistic RA Percent Paced: 3.49 %
Brady Statistic RV Percent Paced: 0.05 %
Date Time Interrogation Session: 20231217234308
Implantable Lead Connection Status: 753985
Implantable Lead Connection Status: 753985
Implantable Lead Implant Date: 20210322
Implantable Lead Implant Date: 20210322
Implantable Lead Location: 753859
Implantable Lead Location: 753860
Implantable Lead Model: 5076
Implantable Lead Model: 5076
Implantable Pulse Generator Implant Date: 20210322
Lead Channel Impedance Value: 285 Ohm
Lead Channel Impedance Value: 342 Ohm
Lead Channel Impedance Value: 399 Ohm
Lead Channel Impedance Value: 456 Ohm
Lead Channel Pacing Threshold Amplitude: 0.75 V
Lead Channel Pacing Threshold Amplitude: 0.75 V
Lead Channel Pacing Threshold Pulse Width: 0.4 ms
Lead Channel Pacing Threshold Pulse Width: 0.4 ms
Lead Channel Sensing Intrinsic Amplitude: 3.625 mV
Lead Channel Sensing Intrinsic Amplitude: 3.625 mV
Lead Channel Sensing Intrinsic Amplitude: 8.75 mV
Lead Channel Sensing Intrinsic Amplitude: 8.75 mV
Lead Channel Setting Pacing Amplitude: 1.5 V
Lead Channel Setting Pacing Amplitude: 2.5 V
Lead Channel Setting Pacing Pulse Width: 0.4 ms
Lead Channel Setting Sensing Sensitivity: 2 mV
Zone Setting Status: 755011
Zone Setting Status: 755011

## 2022-01-20 LAB — BASIC METABOLIC PANEL
BUN/Creatinine Ratio: 19 (ref 12–28)
BUN: 50 mg/dL — ABNORMAL HIGH (ref 8–27)
CO2: 25 mmol/L (ref 20–29)
Calcium: 9.6 mg/dL (ref 8.7–10.3)
Chloride: 105 mmol/L (ref 96–106)
Creatinine, Ser: 2.63 mg/dL — ABNORMAL HIGH (ref 0.57–1.00)
Glucose: 174 mg/dL — ABNORMAL HIGH (ref 70–99)
Potassium: 4.3 mmol/L (ref 3.5–5.2)
Sodium: 143 mmol/L (ref 134–144)
eGFR: 18 mL/min/{1.73_m2} — ABNORMAL LOW (ref >59)

## 2022-01-22 DIAGNOSIS — N184 Chronic kidney disease, stage 4 (severe): Secondary | ICD-10-CM | POA: Diagnosis not present

## 2022-01-22 DIAGNOSIS — E1122 Type 2 diabetes mellitus with diabetic chronic kidney disease: Secondary | ICD-10-CM | POA: Diagnosis not present

## 2022-01-22 NOTE — Telephone Encounter (Signed)
Spoke with pt about cholesterol medications. She will continue her current dose until Dr. Agustin Cree looks at her blood work from 12-18. She will also see her nephrologist on 02-03-22.

## 2022-01-22 NOTE — Telephone Encounter (Signed)
Spoke with pt - per Dr. Ollen Bowl note. Pt agreed to decrease Lasix to 20mg  Bid and have BMP in 1 week. Routed to PCP.

## 2022-01-29 DIAGNOSIS — N289 Disorder of kidney and ureter, unspecified: Secondary | ICD-10-CM | POA: Diagnosis not present

## 2022-01-29 LAB — BASIC METABOLIC PANEL
BUN/Creatinine Ratio: 21 (ref 12–28)
BUN: 37 mg/dL — ABNORMAL HIGH (ref 8–27)
CO2: 23 mmol/L (ref 20–29)
Calcium: 10.2 mg/dL (ref 8.7–10.3)
Chloride: 105 mmol/L (ref 96–106)
Creatinine, Ser: 1.78 mg/dL — ABNORMAL HIGH (ref 0.57–1.00)
Glucose: 197 mg/dL — ABNORMAL HIGH (ref 70–99)
Potassium: 4.8 mmol/L (ref 3.5–5.2)
Sodium: 141 mmol/L (ref 134–144)
eGFR: 28 mL/min/{1.73_m2} — ABNORMAL LOW (ref >59)

## 2022-01-30 ENCOUNTER — Telehealth: Payer: Self-pay

## 2022-01-30 NOTE — Telephone Encounter (Signed)
Results reviewed with pt as per Dr. Krasowski's note.  Pt verbalized understanding and had no additional questions. Routed to PCP  

## 2022-02-02 DIAGNOSIS — G4733 Obstructive sleep apnea (adult) (pediatric): Secondary | ICD-10-CM

## 2022-02-02 DIAGNOSIS — D509 Iron deficiency anemia, unspecified: Secondary | ICD-10-CM | POA: Insufficient documentation

## 2022-02-02 DIAGNOSIS — N184 Chronic kidney disease, stage 4 (severe): Secondary | ICD-10-CM | POA: Insufficient documentation

## 2022-02-02 DIAGNOSIS — N179 Acute kidney failure, unspecified: Secondary | ICD-10-CM | POA: Insufficient documentation

## 2022-02-02 DIAGNOSIS — K559 Vascular disorder of intestine, unspecified: Secondary | ICD-10-CM

## 2022-02-02 DIAGNOSIS — I252 Old myocardial infarction: Secondary | ICD-10-CM | POA: Insufficient documentation

## 2022-02-02 DIAGNOSIS — Z794 Long term (current) use of insulin: Secondary | ICD-10-CM | POA: Insufficient documentation

## 2022-02-02 DIAGNOSIS — Z9181 History of falling: Secondary | ICD-10-CM | POA: Insufficient documentation

## 2022-02-02 DIAGNOSIS — D631 Anemia in chronic kidney disease: Secondary | ICD-10-CM | POA: Insufficient documentation

## 2022-02-02 DIAGNOSIS — I081 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: Secondary | ICD-10-CM

## 2022-02-02 DIAGNOSIS — I255 Ischemic cardiomyopathy: Secondary | ICD-10-CM | POA: Insufficient documentation

## 2022-02-02 DIAGNOSIS — Z8719 Personal history of other diseases of the digestive system: Secondary | ICD-10-CM | POA: Insufficient documentation

## 2022-02-02 DIAGNOSIS — Z950 Presence of cardiac pacemaker: Secondary | ICD-10-CM

## 2022-02-02 DIAGNOSIS — I700 Atherosclerosis of aorta: Secondary | ICD-10-CM | POA: Insufficient documentation

## 2022-02-02 DIAGNOSIS — I2510 Atherosclerotic heart disease of native coronary artery without angina pectoris: Secondary | ICD-10-CM

## 2022-02-02 DIAGNOSIS — K5900 Constipation, unspecified: Secondary | ICD-10-CM

## 2022-02-02 DIAGNOSIS — N189 Chronic kidney disease, unspecified: Secondary | ICD-10-CM

## 2022-02-02 DIAGNOSIS — E1122 Type 2 diabetes mellitus with diabetic chronic kidney disease: Secondary | ICD-10-CM | POA: Insufficient documentation

## 2022-02-02 DIAGNOSIS — Z95818 Presence of other cardiac implants and grafts: Secondary | ICD-10-CM

## 2022-02-02 DIAGNOSIS — I455 Other specified heart block: Secondary | ICD-10-CM | POA: Insufficient documentation

## 2022-02-02 DIAGNOSIS — E785 Hyperlipidemia, unspecified: Secondary | ICD-10-CM

## 2022-02-02 DIAGNOSIS — E039 Hypothyroidism, unspecified: Secondary | ICD-10-CM

## 2022-02-02 DIAGNOSIS — M109 Gout, unspecified: Secondary | ICD-10-CM | POA: Insufficient documentation

## 2022-02-02 DIAGNOSIS — M810 Age-related osteoporosis without current pathological fracture: Secondary | ICD-10-CM

## 2022-02-02 DIAGNOSIS — K219 Gastro-esophageal reflux disease without esophagitis: Secondary | ICD-10-CM | POA: Insufficient documentation

## 2022-02-02 DIAGNOSIS — Z95811 Presence of heart assist device: Secondary | ICD-10-CM | POA: Insufficient documentation

## 2022-02-02 DIAGNOSIS — Z8673 Personal history of transient ischemic attack (TIA), and cerebral infarction without residual deficits: Secondary | ICD-10-CM

## 2022-02-02 HISTORY — DX: Presence of other cardiac implants and grafts: Z95.818

## 2022-02-02 HISTORY — DX: Personal history of other diseases of the digestive system: Z87.19

## 2022-02-02 HISTORY — DX: Type 2 diabetes mellitus with diabetic chronic kidney disease: E11.22

## 2022-02-02 HISTORY — DX: Gastro-esophageal reflux disease without esophagitis: K21.9

## 2022-02-02 HISTORY — DX: Ischemic cardiomyopathy: I25.5

## 2022-02-02 HISTORY — DX: Iron deficiency anemia, unspecified: D50.9

## 2022-02-02 HISTORY — DX: 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: I08.1

## 2022-02-02 HISTORY — DX: Long term (current) use of insulin: Z79.4

## 2022-02-02 HISTORY — DX: Age-related osteoporosis without current pathological fracture: M81.0

## 2022-02-02 HISTORY — DX: Vascular disorder of intestine, unspecified: K55.9

## 2022-02-02 HISTORY — DX: Hyperlipidemia, unspecified: E78.5

## 2022-02-02 HISTORY — DX: Personal history of transient ischemic attack (TIA), and cerebral infarction without residual deficits: Z86.73

## 2022-02-02 HISTORY — DX: Hypothyroidism, unspecified: E03.9

## 2022-02-02 HISTORY — DX: Acute kidney failure, unspecified: N17.9

## 2022-02-02 HISTORY — DX: Constipation, unspecified: K59.00

## 2022-02-02 HISTORY — DX: Anemia in chronic kidney disease: N18.9

## 2022-02-02 HISTORY — DX: Obstructive sleep apnea (adult) (pediatric): G47.33

## 2022-02-02 HISTORY — DX: Chronic kidney disease, stage 4 (severe): N18.4

## 2022-02-02 HISTORY — DX: Other specified heart block: I45.5

## 2022-02-02 HISTORY — DX: History of falling: Z91.81

## 2022-02-02 HISTORY — DX: Old myocardial infarction: I25.2

## 2022-02-02 HISTORY — DX: Atherosclerosis of aorta: I70.0

## 2022-02-02 HISTORY — DX: Atherosclerotic heart disease of native coronary artery without angina pectoris: I25.10

## 2022-02-02 HISTORY — DX: Presence of cardiac pacemaker: Z95.0

## 2022-02-02 HISTORY — DX: Chronic kidney disease, unspecified: D63.1

## 2022-02-03 DIAGNOSIS — I129 Hypertensive chronic kidney disease with stage 1 through stage 4 chronic kidney disease, or unspecified chronic kidney disease: Secondary | ICD-10-CM | POA: Diagnosis not present

## 2022-02-03 DIAGNOSIS — N184 Chronic kidney disease, stage 4 (severe): Secondary | ICD-10-CM | POA: Diagnosis not present

## 2022-02-03 DIAGNOSIS — M898X9 Other specified disorders of bone, unspecified site: Secondary | ICD-10-CM | POA: Diagnosis not present

## 2022-02-03 DIAGNOSIS — E1122 Type 2 diabetes mellitus with diabetic chronic kidney disease: Secondary | ICD-10-CM | POA: Diagnosis not present

## 2022-02-04 ENCOUNTER — Telehealth: Payer: Self-pay | Admitting: Cardiology

## 2022-02-04 NOTE — Telephone Encounter (Signed)
Pt c/o medication issue:  1. Name of Medication: metoprolol  2. How are you currently taking this medication (dosage and times per day)? Not currently taking  3. Are you having a reaction (difficulty breathing--STAT)?   4. What is your medication issue? Patient states her nephrologist thought she was still taking the metoprolol. She would like to verify whether she was supposed to stay on it. She says she has not been taking it.

## 2022-02-05 ENCOUNTER — Encounter: Payer: Self-pay | Admitting: Cardiology

## 2022-02-05 NOTE — Telephone Encounter (Signed)
LVM for pt - per Dr. Agustin Cree that she is not on the Metoprolol.

## 2022-02-09 ENCOUNTER — Telehealth: Payer: Self-pay | Admitting: Cardiology

## 2022-02-09 NOTE — Telephone Encounter (Signed)
Caller would like a call back to discuss stopping the patient's [place] and [place].

## 2022-02-09 NOTE — Telephone Encounter (Signed)
Left message for Amanda [name] to call back 

## 2022-02-10 ENCOUNTER — Other Ambulatory Visit: Payer: Self-pay | Admitting: Cardiology

## 2022-02-13 ENCOUNTER — Other Ambulatory Visit (HOSPITAL_COMMUNITY): Payer: Self-pay

## 2022-02-23 NOTE — Progress Notes (Signed)
Remote pacemaker transmission.   

## 2022-02-25 DIAGNOSIS — Z1231 Encounter for screening mammogram for malignant neoplasm of breast: Secondary | ICD-10-CM | POA: Diagnosis not present

## 2022-02-26 DIAGNOSIS — Z87440 Personal history of urinary (tract) infections: Secondary | ICD-10-CM | POA: Diagnosis not present

## 2022-02-26 DIAGNOSIS — N905 Atrophy of vulva: Secondary | ICD-10-CM | POA: Diagnosis not present

## 2022-03-03 ENCOUNTER — Telehealth: Payer: Self-pay | Admitting: Cardiology

## 2022-03-03 NOTE — Telephone Encounter (Signed)
Pt c/o medication issue:  1. Name of Medication: Evolocumab (REPATHA SURECLICK) 944 MG/ML SOAJ   2. How are you currently taking this medication (dosage and times per day)? Inject 140 mg into the skin every 14 (fourteen) days.   3. Are you having a reaction (difficulty breathing--STAT)?   4. What is your medication issue? Patient called stating she received a letter from her insurance saying this medication is not on their list of covered drugs or is subjected to certain limits. They said the information has been shared with the prescriber.  She was calling to find out if we had heard from the insurance.

## 2022-03-04 DIAGNOSIS — I110 Hypertensive heart disease with heart failure: Secondary | ICD-10-CM | POA: Diagnosis not present

## 2022-03-04 DIAGNOSIS — N184 Chronic kidney disease, stage 4 (severe): Secondary | ICD-10-CM | POA: Diagnosis not present

## 2022-03-04 DIAGNOSIS — E785 Hyperlipidemia, unspecified: Secondary | ICD-10-CM | POA: Diagnosis not present

## 2022-03-04 DIAGNOSIS — E1122 Type 2 diabetes mellitus with diabetic chronic kidney disease: Secondary | ICD-10-CM | POA: Diagnosis not present

## 2022-03-04 NOTE — Telephone Encounter (Signed)
Prior Auth initiated with [employer], Tiffany at 4:02 ending call.  Confirmation# [id number]  Turn around time for decision is 48-72 hrs.  LM notifying the patient (ok per DPR)

## 2022-03-05 DIAGNOSIS — E113292 Type 2 diabetes mellitus with mild nonproliferative diabetic retinopathy without macular edema, left eye: Secondary | ICD-10-CM | POA: Diagnosis not present

## 2022-03-05 DIAGNOSIS — H353122 Nonexudative age-related macular degeneration, left eye, intermediate dry stage: Secondary | ICD-10-CM | POA: Diagnosis not present

## 2022-03-05 DIAGNOSIS — H353211 Exudative age-related macular degeneration, right eye, with active choroidal neovascularization: Secondary | ICD-10-CM | POA: Diagnosis not present

## 2022-03-05 DIAGNOSIS — E113211 Type 2 diabetes mellitus with mild nonproliferative diabetic retinopathy with macular edema, right eye: Secondary | ICD-10-CM | POA: Diagnosis not present

## 2022-03-05 DIAGNOSIS — H43813 Vitreous degeneration, bilateral: Secondary | ICD-10-CM | POA: Diagnosis not present

## 2022-03-12 ENCOUNTER — Encounter (HOSPITAL_COMMUNITY): Payer: Self-pay | Admitting: *Deleted

## 2022-03-30 DIAGNOSIS — Z96642 Presence of left artificial hip joint: Secondary | ICD-10-CM | POA: Diagnosis not present

## 2022-03-30 DIAGNOSIS — M25552 Pain in left hip: Secondary | ICD-10-CM | POA: Diagnosis not present

## 2022-04-06 ENCOUNTER — Other Ambulatory Visit: Payer: Self-pay | Admitting: Cardiology

## 2022-04-17 ENCOUNTER — Ambulatory Visit: Payer: Medicare Other | Attending: Cardiology | Admitting: Cardiology

## 2022-04-17 ENCOUNTER — Encounter: Payer: Self-pay | Admitting: Cardiology

## 2022-04-17 VITALS — BP 126/70 | HR 61 | Ht 62.0 in | Wt 178.2 lb

## 2022-04-17 DIAGNOSIS — I2510 Atherosclerotic heart disease of native coronary artery without angina pectoris: Secondary | ICD-10-CM

## 2022-04-17 DIAGNOSIS — I480 Paroxysmal atrial fibrillation: Secondary | ICD-10-CM | POA: Diagnosis not present

## 2022-04-17 DIAGNOSIS — I129 Hypertensive chronic kidney disease with stage 1 through stage 4 chronic kidney disease, or unspecified chronic kidney disease: Secondary | ICD-10-CM | POA: Diagnosis not present

## 2022-04-17 DIAGNOSIS — I5022 Chronic systolic (congestive) heart failure: Secondary | ICD-10-CM | POA: Diagnosis not present

## 2022-04-17 DIAGNOSIS — Z950 Presence of cardiac pacemaker: Secondary | ICD-10-CM

## 2022-04-17 DIAGNOSIS — N184 Chronic kidney disease, stage 4 (severe): Secondary | ICD-10-CM

## 2022-04-17 DIAGNOSIS — Z95818 Presence of other cardiac implants and grafts: Secondary | ICD-10-CM

## 2022-04-17 NOTE — Progress Notes (Unsigned)
Cardiology Office Note:    Date:  04/17/2022   ID:  [name], DOB [birth year], MRN [id number]  PCP:  Street, Sharon Mt, MD  Cardiologist:  Jenne Campus, MD    Referring MD: Street, Sharon Mt, *   Chief Complaint  Patient presents with   Follow-up    History of Present Illness:    Amanda [name] is a 82 y.o. female  e with past medical history significant for paroxysmal atrial fibrillation, coronary artery disease, status post PTCA and stenting to mid RCA in 2018, history of CVA, she is not anticoagulated because of frequent GI bleed however she does have watchman device, chronic kidney failure with creatinine of 1.7, diabetes, essential hypertension, obstructive sleep apnea.  Comes today to months for follow-up.  Reports she is doing well.  She denies any chest pain tightness squeezing pressure burning chest no palpitation dizziness swelling of lower extremities after admit she looks good and she is doing well.  Past Medical History:  Diagnosis Date   Acute ischemic stroke ([place])    Acute metabolic encephalopathy    Acute on chronic systolic (congestive) heart failure (HCC)    Acute renal insufficiency 09/02/2016   Anemia due to stage 4 chronic kidney disease ([place]) 04/04/2015   Atrial fibrillation (HCC)    Benign hypertension with CKD (chronic kidney disease) stage IV ([place]) 04/04/2015   Bradycardia 09/17/2014   Chest pain 06/23/2016   Overview:  Added automatically from request for surgery [id number]   Chronic anemia    Chronic kidney disease (CKD), stage III (moderate) (HCC)    Coronary artery disease 10/12/2016   Non drug-eluting stent implanted in June 2018 to mid RCA   08/08/2016 10:37  Angiographic Findings  Cardiac Arteries and Lesion Findings LMCA: 0% and Normal. LAD: 0% and Normal. RCA:   Lesion on Mid RCA: Mid subsection.95% stenosis reduced to 0%. Pre procedure   TIMI II flow was noted. Post Procedure TIMI III flow was present. Poor run   off was present.  The lesion was diagnosed as High Risk (C).    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 ([place]) 04/04/2015   Diabetes mellitus without complication ([place])    Diverticulosis    Dyslipidemia 09/17/2014   Gastroesophageal reflux    GI bleed 08/06/2016   Heart block AV complete ([place]) 09/05/2019   History of cardiac monitoring 02/23/2018   monitor inserted   History of iron deficiency anemia 12/28/2017   Hyperlipidemia    Hypertensive heart disease with heart failure ([place])    Iron deficiency anemia 12/28/2017   LV dysfunction 09/02/2016   Myocardial infarction [facility])    NSTEMI (non-ST elevated myocardial infarction) ([place]) 08/06/2016   Obstructive sleep apnea 09/17/2014   Orthostatic hypotension 12/28/2017   OSA on CPAP    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   Postural dizziness with presyncope 09/23/2017   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/24/2017   Recurrent left pleural effusion 03/14/2015   Renal failure, chronic, stage 3 (moderate) (HCC)    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   Syncope [id number]   Systolic heart failure ([place])    Thyroid disease    Vitamin D deficiency 04/04/2015    Past Surgical History:  Procedure Laterality Date   BUBBLE STUDY  05/16/2020   Procedure: BUBBLE STUDY;  Surgeon: Elouise Munroe, MD;  Location: [place];  Service: Cardiovascular;;   CARDIAC CATHETERIZATION     CARDIOVERSION N/A 05/16/2020   Procedure: CARDIOVERSION;  Surgeon: Elouise Munroe, MD;  Location: [facility] ENDOSCOPY;  Service: Cardiovascular;  Laterality: N/A;   CARDIOVERSION N/A 09/06/2020   Procedure: CARDIOVERSION;  Surgeon: Donato Heinz, MD;  Location: [place];  Service: Cardiovascular;  Laterality: N/A;   CARDIOVERSION N/A 05/16/2021   Procedure: CARDIOVERSION;  Surgeon: Sueanne Margarita, MD;  Location: [facility] ENDOSCOPY;  Service: Cardiovascular;  Laterality: N/A;   COLONOSCOPY  11/21/2014   Moderate predominantly  sigmoid diverticulosis. Otherwise noraml collonscopy to TI.    CORONARY ANGIOPLASTY WITH STENT PLACEMENT  08/2016   EXCISIONAL HEMORRHOIDECTOMY     LEFT ATRIAL APPENDAGE OCCLUSION  11/2016   in [place] N/A 02/23/2018   Procedure: [place];  Surgeon: Constance Haw, MD;  Location: [place] CV LAB;  Service: Cardiovascular;  Laterality: N/A;   LOOP RECORDER REMOVAL N/A 04/24/2019   Procedure: LOOP RECORDER REMOVAL;  Surgeon: Constance Haw, MD;  Location: [place] CV LAB;  Service: Cardiovascular;  Laterality: N/A;   NOSE SURGERY     PACEMAKER IMPLANT N/A 04/24/2019   Procedure: PACEMAKER IMPLANT;  Surgeon: Constance Haw, MD;  Location: [place] CV LAB;  Service: Cardiovascular;  Laterality: N/A;   TEE WITHOUT CARDIOVERSION N/A 05/16/2020   Procedure: TRANSESOPHAGEAL ECHOCARDIOGRAM (TEE);  Surgeon: Elouise Munroe, MD;  Location: [facility] ENDOSCOPY;  Service: Cardiovascular;  Laterality: N/A;   TOTAL HIP REVISION Left 03/25/2021   Procedure: LEFT POSTERIOR TOTAL HIP  ZIMMER CABLES;  Surgeon: Paralee Cancel, MD;  Location: [facility];  Service: Orthopedics;  Laterality: Left;    Current Medications: Current Meds  Medication Sig   albuterol (VENTOLIN HFA) 108 (90 Base) MCG/ACT inhaler Inhale 2 puffs into the lungs every 6 (six) hours as needed for wheezing or shortness of breath.   amiodarone (PACERONE) 200 MG tablet TAKE 1 TABLET BY MOUTH DAILY.   amLODipine (NORVASC) 5 MG tablet TAKE 1 TABLET BY MOUTH DAILY   BIOTIN PO Take 1,000 mcg by mouth 2 (two) times daily.   blood glucose meter kit and supplies KIT Dispense based on patient and insurance preference. Use up to four times daily as directed. (Patient taking differently: Inject 1 each into the skin as directed. Dispense based on patient and insurance preference. Use up to four times daily as directed.)   carboxymethylcellul-glycerin (REFRESH RELIEVA) 0.5-0.9 % ophthalmic solution  Place 1 drop into both eyes daily as needed for dry eyes. VE   Cholecalciferol (VITAMIN D3) 50 MCG (2000 UT) TABS Take 2,000 Units by mouth daily at 12 noon.   Coenzyme Q10 10 MG capsule Take 30 mg by mouth 2 (two) times daily.   colchicine 0.6 MG tablet Take 0.6 mg by mouth daily as needed (gout). For gout flare ups   denosumab (PROLIA) 60 MG/ML SOSY injection Inject 60 mg into the skin every 6 (six) months.   docusate sodium (COLACE) 100 MG capsule Take 100 mg by mouth 2 (two) times daily.   estradiol (ESTRACE) 0.1 MG/GM vaginal cream Place 1 Applicatorful vaginally every Monday, Wednesday, and Friday.    Evolocumab (REPATHA SURECLICK) [id number] MG/ML SOAJ Inject 140 mg into the skin every 14 (fourteen) days.   famotidine (PEPCID) 40 MG tablet Take 40 mg by mouth daily.   febuxostat (ULORIC) 40 MG tablet Take 40 mg by mouth at bedtime.   feeding supplement, GLUCERNA SHAKE, (GLUCERNA SHAKE) LIQD Take 237 mLs by mouth 3 (three) times daily between meals. (Patient taking differently: Take 237 mLs by mouth daily.)  ferrous sulfate 325 (65 FE) MG EC tablet Take 325 mg by mouth 2 (two) times daily with breakfast and lunch.   folic acid (FOLVITE) Q000111Q MCG tablet Take 800 mcg by mouth daily at 12 noon.   furosemide (LASIX) 20 MG tablet Take 20 mg by mouth daily.   Glucosamine-Chondroitin (OSTEO BI-FLEX REGULAR STRENGTH PO) Take 25 mcg by mouth daily at 12 noon. Plus D3   hydrALAZINE (APRESOLINE) 50 MG tablet Take 1 tablet (50 mg total) by mouth every 6 (six) hours.   isosorbide mononitrate (IMDUR) 30 MG 24 hr tablet Take 1 tablet (30 mg total) by mouth daily.   Lactobacillus (FLORAJEN ACIDOPHILUS) CAPS Take 1 capsule by mouth daily at 12 noon. 390 mg each   levothyroxine (SYNTHROID, LEVOTHROID) 50 MCG tablet Take 50 mcg by mouth daily before breakfast.   Multiple Vitamins-Minerals (PRESERVISION AREDS 2) CAPS Take 1 capsule by mouth 2 (two) times daily.   NITROGLYCERIN PO Take 0.4 mg by mouth daily as needed  (Chest pain). For chest pain   omega-3 acid ethyl esters (LOVAZA) 1 g capsule Take 2 capsules (2 g total) by mouth daily.   pantoprazole (PROTONIX) 40 MG tablet Take 1 tablet (40 mg total) by mouth daily.   polyethylene glycol powder (GLYCOLAX/MIRALAX) powder Take 17 g by mouth every other day. Alternating between miralax and benefiber   pravastatin (PRAVACHOL) 40 MG tablet Take 40 mg by mouth at bedtime.   ranolazine (RANEXA) 500 MG 12 hr tablet Take 1 tablet (500 mg total) by mouth 2 (two) times daily.   vitamin B-12 (CYANOCOBALAMIN) 1000 MCG tablet Take 1,000 mcg by mouth daily at 12 noon.   Wheat Dextrin (BENEFIBER DRINK MIX PO) Take 1 Scoop by mouth daily. Mix 1 capful with beverage and drink every other day     Allergies:   Ciprofloxacin, Contrast media [iodinated contrast media], Nsaids, and Statins   Social History   Socioeconomic History   Marital status: Married    Spouse name: Not on file   Number of children: 2   Years of education: Not on file   Highest education level: Not on file  Occupational History   Occupation: Retired   Tobacco Use   Smoking status: Former   Smokeless tobacco: Former    Quit date: 1997   Tobacco comments:    Former smoker 05/22/21  Vaping Use   Vaping Use: Never used  Substance and Sexual Activity   Alcohol use: Not Currently    Alcohol/week: 2.0 standard drinks of alcohol    Types: 2 Standard drinks or equivalent per week   Drug use: No   Sexual activity: Not on file  Other Topics Concern   Not on file  Social History Narrative   Not on file   Social Determinants of Health   Financial Resource Strain: Not on file  Food Insecurity: Not on file  Transportation Needs: Not on file  Physical Activity: Not on file  Stress: Not on file  Social Connections: Not on file     Family History: The patient's family history includes Breast cancer in her mother; Cancer in her maternal grandfather; Diabetes in her sister; Heart attack in her  brother, maternal uncle, and sister; Hypertension in her father; Prostate cancer in her father; Stroke in her father. There is no history of Esophageal cancer. ROS:   Please see the history of present illness.    All 14 point review of systems negative except as described per history of present illness  EKGs/Labs/Other Studies Reviewed:      Recent Labs: 05/16/2021: Hemoglobin 8.9; Platelets 270 08/04/2021: ALT 16; TSH 4.340 01/07/2022: NT-Pro BNP 1,181 01/29/2022: BUN 37; Creatinine, Ser 1.78; Potassium 4.8; Sodium 141  Recent Lipid Panel    Component Value Date/Time   CHOL 125 09/05/2019 0842   TRIG 162 (H) 09/05/2019 0842   HDL 59 09/05/2019 0842   CHOLHDL 2.1 09/05/2019 0842   LDLCALC 39 09/05/2019 0842    Physical Exam:    VS:  BP 126/70 (BP Location: Left Arm, Patient Position: Sitting)   Pulse 61   Ht 5\' 2"  (1.575 m)   Wt 178 lb 3.2 oz (80.8 kg)   SpO2 98%   BMI 32.59 kg/m     Wt Readings from Last 3 Encounters:  04/17/22 178 lb 3.2 oz (80.8 kg)  10/14/21 181 lb 3.2 oz (82.2 kg)  08/04/21 179 lb (81.2 kg)     GEN:  Well nourished, well developed in no acute distress HEENT: Normal NECK: No JVD; No carotid bruits LYMPHATICS: No lymphadenopathy CARDIAC: RRR, no murmurs, no rubs, no gallops RESPIRATORY:  Clear to auscultation without rales, wheezing or rhonchi  ABDOMEN: Soft, non-tender, non-distended MUSCULOSKELETAL:  No edema; No deformity  SKIN: Warm and dry LOWER EXTREMITIES: no swelling NEUROLOGIC:  Alert and oriented x 3 PSYCHIATRIC:  Normal affect   ASSESSMENT:    1. Paroxysmal atrial fibrillation (HCC)   2. Benign hypertension with CKD (chronic kidney disease) stage IV ([place])   3. Chronic systolic CHF (congestive heart failure) ([place])   4. Coronary artery disease involving native coronary artery of native heart without angina pectoris   5. Pacemaker Medtronic device   6. Presence of Watchman left atrial appendage closure device    PLAN:    In order  of problems listed above:  Paroxysmal atrial fibrillation maintained sinus rhythm I did review interrogation of her device which was done in December no episode of atrial fibrillation in summer last year he is not anticoagulated secondary to the fact that she have Watchman device and history of GI bleed. Congestive heart failure.  Will schedule her to have echocardiogram overall she looks compensated on the physical exam. Dyslipidemia K PN show me her total cholesterol 121 HDL 55 she is on Repatha which we will continue. Chronic kidney failure followed by kidney specialist.  Stable last creatinine is 1.78 from January 29, 2022   Medication Adjustments/Labs and Tests Ordered: Current medicines are reviewed at length with the patient today.  Concerns regarding medicines are outlined above.  No orders of the defined types were placed in this encounter.  Medication changes: No orders of the defined types were placed in this encounter.   Signed, Park Liter, MD, [facility] 04/17/2022 11:53 AM    [place]

## 2022-04-17 NOTE — Patient Instructions (Signed)
Medication Instructions:  Your physician recommends that you continue on your current medications as directed. Please refer to the Current Medication list given to you today.  *If you need a refill on your cardiac medications before your next appointment, please call your pharmacy*   Lab Work: None ordered If you have labs (blood work) drawn today and your tests are completely normal, you will receive your results only by: MyChart Message (if you have MyChart) OR A paper copy in the mail If you have any lab test that is abnormal or we need to change your treatment, we will call you to review the results.   Testing/Procedures: Your physician has requested that you have an echocardiogram. Echocardiography is a painless test that uses sound waves to create images of your heart. It provides your doctor with information about the size and shape of your heart and how well your heart's chambers and valves are working. This procedure takes approximately one hour. There are no restrictions for this procedure. Please do NOT wear cologne, perfume, aftershave, or lotions (deodorant is allowed). Please arrive 15 minutes prior to your appointment time.     Follow-Up: At [facility], you and your health needs are our priority.  As part of our continuing mission to provide you with exceptional heart care, we have created designated Provider Care Teams.  These Care Teams include your primary Cardiologist (physician) and Advanced Practice Providers (APPs -  Physician Assistants and Nurse Practitioners) who all work together to provide you with the care you need, when you need it.  We recommend signing up for the patient portal called "MyChart".  Sign up information is provided on this After Visit Summary.  MyChart is used to connect with patients for Virtual Visits (Telemedicine).  Patients are able to view lab/test results, encounter notes, upcoming appointments, etc.  Non-urgent messages can be sent to  your provider as well.   To learn more about what you can do with MyChart, go to [email or web address].    Your next appointment:   6 month(s)  The format for your next appointment:   In Person  Provider:   Rajan Revankar, MD   Other Instructions Echocardiogram An echocardiogram is a test that uses sound waves (ultrasound) to produce images of the heart. Images from an echocardiogram can provide important information about: Heart size and shape. The size and thickness and movement of your heart's walls. Heart muscle function and strength. Heart valve function or if you have stenosis. Stenosis is when the heart valves are too narrow. If blood is flowing backward through the heart valves (regurgitation). A tumor or infectious growth around the heart valves. Areas of heart muscle that are not working well because of poor blood flow or injury from a heart attack. Aneurysm detection. An aneurysm is a weak or damaged part of an artery wall. The wall bulges out from the normal force of blood pumping through the body. Tell a health care provider about: Any allergies you have. All medicines you are taking, including vitamins, herbs, eye drops, creams, and over-the-counter medicines. Any blood disorders you have. Any surgeries you have had. Any medical conditions you have. Whether you are pregnant or may be pregnant. What are the risks? Generally, this is a safe test. However, problems may occur, including an allergic reaction to dye (contrast) that may be used during the test. What happens before the test? No specific preparation is needed. You may eat and drink normally. What happens during the test? You will   take off your clothes from the waist up and put on a hospital gown. Electrodes or electrocardiogram (ECG)patches may be placed on your chest. The electrodes or patches are then connected to a device that monitors your heart rate and rhythm. You will lie down on a table for an  ultrasound exam. A gel will be applied to your chest to help sound waves pass through your skin. A handheld device, called a transducer, will be pressed against your chest and moved over your heart. The transducer produces sound waves that travel to your heart and bounce back (or "echo" back) to the transducer. These sound waves will be captured in real-time and changed into images of your heart that can be viewed on a video monitor. The images will be recorded on a computer and reviewed by your health care provider. You may be asked to change positions or hold your breath for a short time. This makes it easier to get different views or better views of your heart. In some cases, you may receive contrast through an IV in one of your veins. This can improve the quality of the pictures from your heart. The procedure may vary among health care providers and hospitals.   What can I expect after the test? You may return to your normal, everyday life, including diet, activities, and medicines, unless your health care provider tells you not to do that. Follow these instructions at home: It is up to you to get the results of your test. Ask your health care provider, or the department that is doing the test, when your results will be ready. Keep all follow-up visits. This is important. Summary An echocardiogram is a test that uses sound waves (ultrasound) to produce images of the heart. Images from an echocardiogram can provide important information about the size and shape of your heart, heart muscle function, heart valve function, and other possible heart problems. You do not need to do anything to prepare before this test. You may eat and drink normally. After the echocardiogram is completed, you may return to your normal, everyday life, unless your health care provider tells you not to do that. This information is not intended to replace advice given to you by your health care provider. Make sure you  discuss any questions you have with your health care provider. Document Revised: 09/12/2019 Document Reviewed: 09/12/2019 Elsevier Patient Education  2021 Elsevier Inc.   Important Information About Sugar        

## 2022-04-20 ENCOUNTER — Ambulatory Visit (INDEPENDENT_AMBULATORY_CARE_PROVIDER_SITE_OTHER): Payer: Medicare Other

## 2022-04-20 DIAGNOSIS — I442 Atrioventricular block, complete: Secondary | ICD-10-CM | POA: Diagnosis not present

## 2022-04-20 LAB — CUP PACEART REMOTE DEVICE CHECK
Battery Remaining Longevity: 145 mo
Battery Voltage: 3.03 V
Brady Statistic AP VP Percent: 0 %
Brady Statistic AP VS Percent: 1.31 %
Brady Statistic AS VP Percent: 0.04 %
Brady Statistic AS VS Percent: 98.65 %
Brady Statistic RA Percent Paced: 1.29 %
Brady Statistic RV Percent Paced: 0.05 %
Date Time Interrogation Session: 20240317210910
Implantable Lead Connection Status: 753985
Implantable Lead Connection Status: 753985
Implantable Lead Implant Date: 20210322
Implantable Lead Implant Date: 20210322
Implantable Lead Location: 753859
Implantable Lead Location: 753860
Implantable Lead Model: 5076
Implantable Lead Model: 5076
Implantable Pulse Generator Implant Date: 20210322
Lead Channel Impedance Value: 285 Ohm
Lead Channel Impedance Value: 323 Ohm
Lead Channel Impedance Value: 361 Ohm
Lead Channel Impedance Value: 437 Ohm
Lead Channel Pacing Threshold Amplitude: 0.75 V
Lead Channel Pacing Threshold Amplitude: 0.75 V
Lead Channel Pacing Threshold Pulse Width: 0.4 ms
Lead Channel Pacing Threshold Pulse Width: 0.4 ms
Lead Channel Sensing Intrinsic Amplitude: 2.875 mV
Lead Channel Sensing Intrinsic Amplitude: 2.875 mV
Lead Channel Sensing Intrinsic Amplitude: 7.625 mV
Lead Channel Sensing Intrinsic Amplitude: 7.625 mV
Lead Channel Setting Pacing Amplitude: 1.5 V
Lead Channel Setting Pacing Amplitude: 2.5 V
Lead Channel Setting Pacing Pulse Width: 0.4 ms
Lead Channel Setting Sensing Sensitivity: 2 mV
Zone Setting Status: 755011
Zone Setting Status: 755011

## 2022-04-27 ENCOUNTER — Other Ambulatory Visit: Payer: Self-pay | Admitting: Cardiology

## 2022-04-30 DIAGNOSIS — R300 Dysuria: Secondary | ICD-10-CM | POA: Diagnosis not present

## 2022-04-30 DIAGNOSIS — R809 Proteinuria, unspecified: Secondary | ICD-10-CM | POA: Diagnosis not present

## 2022-04-30 DIAGNOSIS — N3091 Cystitis, unspecified with hematuria: Secondary | ICD-10-CM | POA: Diagnosis not present

## 2022-05-01 ENCOUNTER — Ambulatory Visit: Payer: Medicare Other | Attending: Cardiology

## 2022-05-01 DIAGNOSIS — I2510 Atherosclerotic heart disease of native coronary artery without angina pectoris: Secondary | ICD-10-CM

## 2022-05-01 DIAGNOSIS — I5022 Chronic systolic (congestive) heart failure: Secondary | ICD-10-CM | POA: Diagnosis not present

## 2022-05-01 DIAGNOSIS — I480 Paroxysmal atrial fibrillation: Secondary | ICD-10-CM | POA: Diagnosis not present

## 2022-05-01 LAB — ECHOCARDIOGRAM COMPLETE
AR max vel: 1.42 cm2
AV Area VTI: 1.42 cm2
AV Area mean vel: 1.34 cm2
AV Mean grad: 8.4 mmHg
AV Peak grad: 14.9 mmHg
Ao pk vel: 1.93 m/s
Area-P 1/2: 3.02 cm2
MV M vel: 6.36 m/s
MV Peak grad: 161.8 mmHg
MV VTI: 1.52 cm2
Radius: 1 cm
S' Lateral: 1.7 cm

## 2022-05-20 ENCOUNTER — Other Ambulatory Visit: Payer: Self-pay | Admitting: Cardiology

## 2022-05-27 DIAGNOSIS — H43813 Vitreous degeneration, bilateral: Secondary | ICD-10-CM | POA: Diagnosis not present

## 2022-05-27 DIAGNOSIS — E113213 Type 2 diabetes mellitus with mild nonproliferative diabetic retinopathy with macular edema, bilateral: Secondary | ICD-10-CM | POA: Diagnosis not present

## 2022-05-27 DIAGNOSIS — H353211 Exudative age-related macular degeneration, right eye, with active choroidal neovascularization: Secondary | ICD-10-CM | POA: Diagnosis not present

## 2022-05-27 DIAGNOSIS — H353122 Nonexudative age-related macular degeneration, left eye, intermediate dry stage: Secondary | ICD-10-CM | POA: Diagnosis not present

## 2022-05-27 NOTE — Progress Notes (Signed)
Remote pacemaker transmission.   

## 2022-06-01 ENCOUNTER — Encounter: Payer: Self-pay | Admitting: Cardiology

## 2022-06-01 ENCOUNTER — Telehealth: Payer: Self-pay

## 2022-06-01 ENCOUNTER — Ambulatory Visit: Payer: Medicare Other | Attending: Cardiology | Admitting: Cardiology

## 2022-06-01 ENCOUNTER — Telehealth: Payer: Self-pay | Admitting: Cardiology

## 2022-06-01 VITALS — BP 144/80 | HR 62 | Ht 62.0 in | Wt 179.6 lb

## 2022-06-01 DIAGNOSIS — Z79899 Other long term (current) drug therapy: Secondary | ICD-10-CM

## 2022-06-01 DIAGNOSIS — I10 Essential (primary) hypertension: Secondary | ICD-10-CM

## 2022-06-01 DIAGNOSIS — I442 Atrioventricular block, complete: Secondary | ICD-10-CM | POA: Diagnosis not present

## 2022-06-01 DIAGNOSIS — G4733 Obstructive sleep apnea (adult) (pediatric): Secondary | ICD-10-CM

## 2022-06-01 DIAGNOSIS — D6869 Other thrombophilia: Secondary | ICD-10-CM | POA: Diagnosis not present

## 2022-06-01 DIAGNOSIS — I4819 Other persistent atrial fibrillation: Secondary | ICD-10-CM

## 2022-06-01 NOTE — Telephone Encounter (Signed)
Pt c/o medication issue:  1. Name of Medication: Evolocumab (REPATHA SURECLICK) 140 MG/ML SOAJ   2. How are you currently taking this medication (dosage and times per day)? N/A  3. Are you having a reaction (difficulty breathing--STAT)? No   4. What is your medication issue? Pharmacy is calling wanting to confirm the patient is still on this medication due to request being denied. Please advise.

## 2022-06-01 NOTE — Progress Notes (Signed)
Electrophysiology Office Note   Date:  06/01/2022   ID:  Amanda [name], DOB 10/24/1940, MRN [id number]  PCP:  Street, Stephanie Coup, MD  Cardiologist: Bing Matter Primary Electrophysiologist:  Nickisha Hum Jorja Loa, MD    No chief complaint on file.     History of Present Illness: Amanda [name] is a 82 y.o. female who is being seen today for the evaluation of atrial fibrillation, syncope at the request of Gypsy Balsam. Presenting today for electrophysiology evaluation.    She has a history significant for ischemic stroke, atrial fibrillation, CKD stage III, diabetes, hypertension, OSA on CPAP.  She is post watchman due to recurrent GI bleeds.  She has a history of syncope and is post Medtronic dual-chamber pacemaker implanted 04/24/2019 for long pauses and intermittent heart block.  She is on amiodarone for atrial fibrillation.  Today, denies symptoms of palpitations, chest pain, shortness of breath, orthopnea, PND, lower extremity edema, claudication, dizziness, presyncope, syncope, bleeding, or neurologic sequela. The patient is tolerating medications without difficulties.     Past Medical History:  Diagnosis Date   Acute ischemic stroke (HCC)    Acute metabolic encephalopathy    Acute on chronic systolic (congestive) heart failure (HCC)    Acute renal insufficiency 09/02/2016   Anemia due to stage 4 chronic kidney disease (HCC) 04/04/2015   Atrial fibrillation (HCC)    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) 04/04/2015   Bradycardia 09/17/2014   Chest pain 06/23/2016   Overview:  Added automatically from request for surgery [id number]   Chronic anemia    Chronic kidney disease (CKD), stage III (moderate) (HCC)    Coronary artery disease 10/12/2016   Non drug-eluting stent implanted in June 2018 to mid RCA   08/08/2016 10:37  Angiographic Findings  Cardiac Arteries and Lesion Findings LMCA: 0% and Normal. LAD: 0% and Normal. RCA:   Lesion on Mid  RCA: Mid subsection.95% stenosis reduced to 0%. Pre procedure   TIMI II flow was noted. Post Procedure TIMI III flow was present. Poor run   off was present. The lesion was diagnosed as High Risk (C).    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   History of cardiac monitoring 02/23/2018   monitor inserted   History of iron deficiency anemia 12/28/2017   Hyperlipidemia    Hypertensive heart disease with heart failure (HCC)    Iron deficiency anemia 12/28/2017   LV dysfunction 09/02/2016   Myocardial infarction [facility])    NSTEMI (non-ST elevated myocardial infarction) (HCC) 08/06/2016   Obstructive sleep apnea 09/17/2014   Orthostatic hypotension 12/28/2017   OSA on CPAP    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   Postural dizziness with presyncope 09/23/2017   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/24/2017   Recurrent left pleural effusion 03/14/2015   Renal failure, chronic, stage 3 (moderate) (HCC)    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   Syncope 11/04/2017   Systolic heart failure (HCC)    Thyroid disease    Vitamin D deficiency 04/04/2015   Past Surgical History:  Procedure Laterality Date   BUBBLE STUDY  05/16/2020   Procedure: BUBBLE STUDY;  Surgeon: Parke Poisson, MD;  Location: [facility] ENDOSCOPY;  Service: Cardiovascular;;   CARDIAC CATHETERIZATION  CARDIOVERSION N/A 05/16/2020   Procedure: CARDIOVERSION;  Surgeon: Parke Poisson, MD;  Location: [facility] ENDOSCOPY;  Service: Cardiovascular;  Laterality: N/A;   CARDIOVERSION N/A 09/06/2020   Procedure: CARDIOVERSION;  Surgeon: Little Ishikawa, MD;  Location: [facility] ENDOSCOPY;  Service: Cardiovascular;  Laterality: N/A;   CARDIOVERSION N/A 05/16/2021   Procedure:  CARDIOVERSION;  Surgeon: Quintella Reichert, MD;  Location: [facility] ENDOSCOPY;  Service: Cardiovascular;  Laterality: N/A;   COLONOSCOPY  11/21/2014   Moderate predominantly sigmoid diverticulosis. Otherwise noraml collonscopy to TI.    CORONARY ANGIOPLASTY WITH STENT PLACEMENT  08/2016   EXCISIONAL HEMORRHOIDECTOMY     LEFT ATRIAL APPENDAGE OCCLUSION  11/2016   in [place]   LOOP RECORDER INSERTION N/A 02/23/2018   Procedure: LOOP RECORDER INSERTION;  Surgeon: Regan Lemming, MD;  Location: [facility] INVASIVE CV LAB;  Service: Cardiovascular;  Laterality: N/A;   LOOP RECORDER REMOVAL N/A 04/24/2019   Procedure: LOOP RECORDER REMOVAL;  Surgeon: Regan Lemming, MD;  Location: [facility] INVASIVE CV LAB;  Service: Cardiovascular;  Laterality: N/A;   NOSE SURGERY     PACEMAKER IMPLANT N/A 04/24/2019   Procedure: PACEMAKER IMPLANT;  Surgeon: Regan Lemming, MD;  Location: [facility] INVASIVE CV LAB;  Service: Cardiovascular;  Laterality: N/A;   TEE WITHOUT CARDIOVERSION N/A 05/16/2020   Procedure: TRANSESOPHAGEAL ECHOCARDIOGRAM (TEE);  Surgeon: Parke Poisson, MD;  Location: [facility] ENDOSCOPY;  Service: Cardiovascular;  Laterality: N/A;   TOTAL HIP REVISION Left 03/25/2021   Procedure: LEFT POSTERIOR TOTAL HIP  ZIMMER CABLES;  Surgeon: Durene Romans, MD;  Location: [facility] ORS;  Service: Orthopedics;  Laterality: Left;     Current Outpatient Medications  Medication Sig Dispense Refill   albuterol (VENTOLIN HFA) 108 (90 Base) MCG/ACT inhaler Inhale 2 puffs into the lungs every 6 (six) hours as needed for wheezing or shortness of breath.     amiodarone (PACERONE) 200 MG tablet TAKE 1 TABLET BY MOUTH DAILY. 90 tablet 1   amLODipine (NORVASC) 5 MG tablet TAKE 1 TABLET BY MOUTH DAILY 30 tablet 6   BIOTIN PO Take 1,000 mcg by mouth 2 (two) times daily.     blood glucose meter kit and supplies KIT Dispense based on patient and insurance preference. Use up to four times daily as directed. (Patient taking differently:  Inject 1 each into the skin as directed. Dispense based on patient and insurance preference. Use up to four times daily as directed.) 1 each 0   carboxymethylcellul-glycerin (REFRESH RELIEVA) 0.5-0.9 % ophthalmic solution Place 1 drop into both eyes daily as needed for dry eyes. VE     Cholecalciferol (VITAMIN D3) 50 MCG (2000 UT) TABS Take 2,000 Units by mouth daily at 12 noon.     Coenzyme Q10 10 MG capsule Take 30 mg by mouth 2 (two) times daily.     colchicine 0.6 MG tablet Take 0.6 mg by mouth daily as needed (gout). For gout flare ups     denosumab (PROLIA) 60 MG/ML SOSY injection Inject 60 mg into the skin every 6 (six) months.     docusate sodium (COLACE) 100 MG capsule Take 100 mg by mouth 2 (two) times daily.     estradiol (ESTRACE) 0.1 MG/GM vaginal cream Place 1 Applicatorful vaginally every Monday, Wednesday, and Friday.      Evolocumab (REPATHA SURECLICK) 140 MG/ML SOAJ Inject 140 mg into the skin every 14 (fourteen) days. 6 mL 1   famotidine (PEPCID) 40 MG tablet Take 40 mg by mouth daily.  febuxostat (ULORIC) 40 MG tablet Take 40 mg by mouth at bedtime.     feeding supplement, GLUCERNA SHAKE, (GLUCERNA SHAKE) LIQD Take 237 mLs by mouth 3 (three) times daily between meals. (Patient taking differently: Take 237 mLs by mouth daily.) 237 mL 0   ferrous sulfate 325 (65 FE) MG EC tablet Take 325 mg by mouth 2 (two) times daily with breakfast and lunch.     folic acid (FOLVITE) 800 MCG tablet Take 800 mcg by mouth daily at 12 noon.     furosemide (LASIX) 20 MG tablet Take 20 mg by mouth daily.     Glucosamine-Chondroitin (OSTEO BI-FLEX REGULAR STRENGTH PO) Take 25 mcg by mouth daily at 12 noon. Plus D3     hydrALAZINE (APRESOLINE) 50 MG tablet Take 1 tablet (50 mg total) by mouth every 6 (six) hours. 360 tablet 2   isosorbide mononitrate (IMDUR) 30 MG 24 hr tablet Take 1 tablet (30 mg total) by mouth daily. 90 tablet 2   Lactobacillus (FLORAJEN ACIDOPHILUS) CAPS Take 1 capsule by mouth  daily at 12 noon. 390 mg each     levothyroxine (SYNTHROID, LEVOTHROID) 50 MCG tablet Take 50 mcg by mouth daily before breakfast.     Multiple Vitamins-Minerals (PRESERVISION AREDS 2) CAPS Take 1 capsule by mouth 2 (two) times daily.     NITROGLYCERIN PO Take 0.4 mg by mouth daily as needed (Chest pain). For chest pain     omega-3 acid ethyl esters (LOVAZA) 1 g capsule Take 2 capsules (2 g total) by mouth daily. 180 capsule 1   pantoprazole (PROTONIX) 40 MG tablet Take 1 tablet (40 mg total) by mouth daily. 90 tablet 1   polyethylene glycol powder (GLYCOLAX/MIRALAX) powder Take 17 g by mouth every other day. Alternating between miralax and benefiber     pravastatin (PRAVACHOL) 40 MG tablet Take 40 mg by mouth at bedtime.     ranolazine (RANEXA) 500 MG 12 hr tablet Take 1 tablet (500 mg total) by mouth 2 (two) times daily. 180 tablet 3   vitamin B-12 (CYANOCOBALAMIN) 1000 MCG tablet Take 1,000 mcg by mouth daily at 12 noon.     Wheat Dextrin (BENEFIBER DRINK MIX PO) Take 1 Scoop by mouth daily. Mix 1 capful with beverage and drink every other day     No current facility-administered medications for this visit.    Allergies:   Atorvastatin, Ciprofloxacin, Contrast media [iodinated contrast media], Nsaids, Rosuvastatin, and Statins   Social History:  The patient  reports that she has quit smoking. She quit smokeless tobacco use about 27 years ago. She reports that she does not currently use alcohol after a past usage of about 2.0 standard drinks of alcohol per week. She reports that she does not use drugs.   Family History:  The patient's family history includes Breast cancer in her mother; Cancer in her maternal grandfather; Diabetes in her sister; Heart attack in her brother, maternal uncle, and sister; Hypertension in her father; Prostate cancer in her father; Stroke in her father.   ROS:  Please see the history of present illness.   Otherwise, review of systems is positive for none.   All  other systems are reviewed and negative.   PHYSICAL EXAM: VS:  BP (!) 144/80   Pulse 62   Ht 5\' 2"  (1.575 m)   Wt 179 lb 9.6 oz (81.5 kg)   SpO2 98%   BMI 32.85 kg/m  , BMI Body mass index is 32.85 kg/m. GEN: Well  nourished, well developed, in no acute distress  HEENT: normal  Neck: no JVD, carotid bruits, or masses Cardiac: RRR; no murmurs, rubs, or gallops,no edema  Respiratory:  clear to auscultation bilaterally, normal work of breathing GI: soft, nontender, nondistended, + BS MS: no deformity or atrophy  Skin: warm and dry, device site well healed Neuro:  Strength and sensation are intact Psych: euthymic mood, full affect  EKG:  EKG is not ordered today. Personal review of the ekg ordered 04/17/22 shows sinus rhythm, 1dAVB  Personal review of the device interrogation today. Results in [facility]   Recent Labs: 08/04/2021: ALT 16; TSH 4.340 01/07/2022: NT-Pro BNP 1,181 01/29/2022: BUN 37; Creatinine, Ser 1.78; Potassium 4.8; Sodium 141    Lipid Panel     Component Value Date/Time   CHOL 125 09/05/2019 0842   TRIG 162 (H) 09/05/2019 0842   HDL 59 09/05/2019 0842   CHOLHDL 2.1 09/05/2019 0842   LDLCALC 39 09/05/2019 0842     Wt Readings from Last 3 Encounters:  06/01/22 179 lb 9.6 oz (81.5 kg)  04/17/22 178 lb 3.2 oz (80.8 kg)  10/14/21 181 lb 3.2 oz (82.2 kg)      Other studies Reviewed: Additional studies/ records that were reviewed today include: TTE 12/22/2017 Review of the above records today demonstrates:  - Left ventricle: The cavity size was normal. Wall thickness was   increased in a pattern of moderate LVH. Systolic function was   normal. The estimated ejection fraction was in the range of 60%   to 65%. Wall motion was normal; there were no regional wall   motion abnormalities. Features are consistent with a pseudonormal   left ventricular filling pattern, with concomitant abnormal   relaxation and increased filling pressure (grade 2 diastolic    dysfunction). - Aortic valve: There was mild stenosis. Valve area (VTI): 1.57   cm^2. Valve area (Vmax): 1.45 cm^2. Valve area (Vmean): 1.57   cm^2. - Mitral valve: There was mild regurgitation. Valve area by   pressure half-time: 2.34 cm^2. - Left atrium: The atrium was moderately dilated.   ASSESSMENT AND PLAN:  1.  Persistent atrial fibrillation: Status post watchman and thus not anticoagulated.  CHA2DS2-VASc of 7.  Currently on amiodarone. Low burden of AF on device interrogation.   2.  Obstructive sleep apnea: CPAP compliance encouraged  3.  Hypertension: well controlled  4.  Complete heart block: Status post Medtronic dual-chamber pacemaker implanted 04/24/2019.  Device functioning appropriately.  Sensing, threshold, impedance within normal limits.  5.  Second hypercoagulable state: Post watchman for atrial fibrillation  6.  High risk medication monitoring: Currently on amiodarone. Henri Guedes check TSH and LFTs today.    Current medicines are reviewed at length with the patient today.   The patient does not have concerns regarding her medicines.  The following changes were made today: none  Labs/ tests ordered today include:  No orders of the defined types were placed in this encounter.     Disposition:   FU 12 months  Signed, Crysta Gulick Jorja Loa, MD  06/01/2022 4:05 PM     [facility] [address] [place] [address] [phone] (office) [phone] (fax)

## 2022-06-01 NOTE — Telephone Encounter (Signed)
This was sent to Prior Auth team to file an appeal.

## 2022-06-01 NOTE — Patient Instructions (Signed)
Medication Instructions:  Your physician recommends that you continue on your current medications as directed. Please refer to the Current Medication list given to you today.  *If you need a refill on your cardiac medications before your next appointment, please call your pharmacy*   Lab Work: Amiodarone suveillance labs today: TSH & LFT  If you have labs (blood work) drawn today and your tests are completely normal, you will receive your results only by: MyChart Message (if you have MyChart) OR A paper copy in the mail If you have any lab test that is abnormal or we need to change your treatment, we will call you to review the results.   Testing/Procedures: None ordered   Follow-Up: At [facility], you and your health needs are our priority.  As part of our continuing mission to provide you with exceptional heart care, we have created designated Provider Care Teams.  These Care Teams include your primary Cardiologist (physician) and Advanced Practice Providers (APPs -  Physician Assistants and Nurse Practitioners) who all work together to provide you with the care you need, when you need it.  Your next appointment:   1 year(s)  The format for your next appointment:   In Person  Provider:   Loman Brooklyn, MD    Thank you for choosing [facility]!!   Dory Horn, RN [phone]  Other Instructions

## 2022-06-02 LAB — CUP PACEART INCLINIC DEVICE CHECK
Battery Remaining Longevity: 143 mo
Battery Voltage: 3.03 V
Brady Statistic AP VP Percent: 0 %
Brady Statistic AP VS Percent: 2.27 %
Brady Statistic AS VP Percent: 0.04 %
Brady Statistic AS VS Percent: 97.69 %
Brady Statistic RA Percent Paced: 2.25 %
Brady Statistic RV Percent Paced: 0.04 %
Date Time Interrogation Session: 20240429154400
Implantable Lead Connection Status: 753985
Implantable Lead Connection Status: 753985
Implantable Lead Implant Date: 20210322
Implantable Lead Implant Date: 20210322
Implantable Lead Location: 753859
Implantable Lead Location: 753860
Implantable Lead Model: 5076
Implantable Lead Model: 5076
Implantable Pulse Generator Implant Date: 20210322
Lead Channel Impedance Value: 285 Ohm
Lead Channel Impedance Value: 323 Ohm
Lead Channel Impedance Value: 380 Ohm
Lead Channel Impedance Value: 437 Ohm
Lead Channel Pacing Threshold Amplitude: 0.75 V
Lead Channel Pacing Threshold Amplitude: 0.875 V
Lead Channel Pacing Threshold Pulse Width: 0.4 ms
Lead Channel Pacing Threshold Pulse Width: 0.4 ms
Lead Channel Sensing Intrinsic Amplitude: 2.875 mV
Lead Channel Sensing Intrinsic Amplitude: 5.125 mV
Lead Channel Sensing Intrinsic Amplitude: 7.125 mV
Lead Channel Sensing Intrinsic Amplitude: 8.875 mV
Lead Channel Setting Pacing Amplitude: 1.75 V
Lead Channel Setting Pacing Amplitude: 2.5 V
Lead Channel Setting Pacing Pulse Width: 0.4 ms
Lead Channel Setting Sensing Sensitivity: 2 mV
Zone Setting Status: 755011
Zone Setting Status: 755011

## 2022-06-02 LAB — HEPATIC FUNCTION PANEL
ALT: 13 IU/L (ref 0–32)
AST: 18 IU/L (ref 0–40)
Albumin: 3.8 g/dL (ref 3.7–4.7)
Alkaline Phosphatase: 86 IU/L (ref 44–121)
Bilirubin Total: 0.3 mg/dL (ref 0.0–1.2)
Bilirubin, Direct: 0.12 mg/dL (ref 0.00–0.40)
Total Protein: 6.1 g/dL (ref 6.0–8.5)

## 2022-06-02 LAB — TSH: TSH: 3.24 u[IU]/mL (ref 0.450–4.500)

## 2022-06-03 ENCOUNTER — Other Ambulatory Visit: Payer: Self-pay | Admitting: Cardiology

## 2022-06-03 ENCOUNTER — Other Ambulatory Visit: Payer: Self-pay

## 2022-06-03 ENCOUNTER — Telehealth: Payer: Self-pay | Admitting: Cardiology

## 2022-06-03 DIAGNOSIS — H353211 Exudative age-related macular degeneration, right eye, with active choroidal neovascularization: Secondary | ICD-10-CM | POA: Diagnosis not present

## 2022-06-03 DIAGNOSIS — H353122 Nonexudative age-related macular degeneration, left eye, intermediate dry stage: Secondary | ICD-10-CM | POA: Diagnosis not present

## 2022-06-03 DIAGNOSIS — H43813 Vitreous degeneration, bilateral: Secondary | ICD-10-CM | POA: Diagnosis not present

## 2022-06-03 DIAGNOSIS — E113213 Type 2 diabetes mellitus with mild nonproliferative diabetic retinopathy with macular edema, bilateral: Secondary | ICD-10-CM | POA: Diagnosis not present

## 2022-06-03 MED ORDER — REPATHA SURECLICK 140 MG/ML ~~LOC~~ SOAJ: 1.0000 mL | SUBCUTANEOUS | 3 refills | Status: DC

## 2022-06-03 NOTE — Telephone Encounter (Signed)
*  STAT* If patient is at the pharmacy, call can be transferred to refill team.   1. Which medications need to be refilled? (please list name of each medication and dose if known)   Evolocumab (REPATHA SURECLICK) 140 MG/ML SOAJ    2. Which pharmacy/location (including street and city if local pharmacy) is medication to be sent to?  [facility] - [place], [place] - [address]    3. Do they need a 30 day or 90 day supply? 30   Pharmacy called stating pt is out of meds and they keep sending request but its been denied. Please advise,

## 2022-06-04 DIAGNOSIS — N184 Chronic kidney disease, stage 4 (severe): Secondary | ICD-10-CM | POA: Diagnosis not present

## 2022-06-04 DIAGNOSIS — N3000 Acute cystitis without hematuria: Secondary | ICD-10-CM | POA: Diagnosis not present

## 2022-06-04 DIAGNOSIS — E1122 Type 2 diabetes mellitus with diabetic chronic kidney disease: Secondary | ICD-10-CM | POA: Diagnosis not present

## 2022-06-04 DIAGNOSIS — E785 Hyperlipidemia, unspecified: Secondary | ICD-10-CM | POA: Diagnosis not present

## 2022-06-05 DIAGNOSIS — N184 Chronic kidney disease, stage 4 (severe): Secondary | ICD-10-CM | POA: Diagnosis not present

## 2022-06-05 DIAGNOSIS — E1122 Type 2 diabetes mellitus with diabetic chronic kidney disease: Secondary | ICD-10-CM | POA: Diagnosis not present

## 2022-06-05 LAB — LAB REPORT - SCANNED
A1c: 6.9
EGFR: 30

## 2022-06-11 DIAGNOSIS — M810 Age-related osteoporosis without current pathological fracture: Secondary | ICD-10-CM | POA: Diagnosis not present

## 2022-06-11 DIAGNOSIS — N184 Chronic kidney disease, stage 4 (severe): Secondary | ICD-10-CM | POA: Diagnosis not present

## 2022-06-11 DIAGNOSIS — S8012XA Contusion of left lower leg, initial encounter: Secondary | ICD-10-CM | POA: Diagnosis not present

## 2022-06-11 DIAGNOSIS — E1122 Type 2 diabetes mellitus with diabetic chronic kidney disease: Secondary | ICD-10-CM | POA: Diagnosis not present

## 2022-06-20 ENCOUNTER — Other Ambulatory Visit: Payer: Self-pay | Admitting: Cardiology

## 2022-07-20 ENCOUNTER — Ambulatory Visit (INDEPENDENT_AMBULATORY_CARE_PROVIDER_SITE_OTHER): Payer: Medicare Other

## 2022-07-20 DIAGNOSIS — I442 Atrioventricular block, complete: Secondary | ICD-10-CM | POA: Diagnosis not present

## 2022-07-20 LAB — CUP PACEART REMOTE DEVICE CHECK
Battery Remaining Longevity: 142 mo
Battery Voltage: 3.03 V
Brady Statistic AP VP Percent: 0.01 %
Brady Statistic AP VS Percent: 1.82 %
Brady Statistic AS VP Percent: 0.04 %
Brady Statistic AS VS Percent: 98.13 %
Brady Statistic RA Percent Paced: 1.81 %
Brady Statistic RV Percent Paced: 0.05 %
Date Time Interrogation Session: 20240616211821
Implantable Lead Connection Status: 753985
Implantable Lead Connection Status: 753985
Implantable Lead Implant Date: 20210322
Implantable Lead Implant Date: 20210322
Implantable Lead Location: 753859
Implantable Lead Location: 753860
Implantable Lead Model: 5076
Implantable Lead Model: 5076
Implantable Pulse Generator Implant Date: 20210322
Lead Channel Impedance Value: 266 Ohm
Lead Channel Impedance Value: 304 Ohm
Lead Channel Impedance Value: 361 Ohm
Lead Channel Impedance Value: 437 Ohm
Lead Channel Pacing Threshold Amplitude: 0.75 V
Lead Channel Pacing Threshold Amplitude: 0.75 V
Lead Channel Pacing Threshold Pulse Width: 0.4 ms
Lead Channel Pacing Threshold Pulse Width: 0.4 ms
Lead Channel Sensing Intrinsic Amplitude: 3.625 mV
Lead Channel Sensing Intrinsic Amplitude: 3.625 mV
Lead Channel Sensing Intrinsic Amplitude: 7.375 mV
Lead Channel Sensing Intrinsic Amplitude: 7.375 mV
Lead Channel Setting Pacing Amplitude: 1.5 V
Lead Channel Setting Pacing Amplitude: 2.5 V
Lead Channel Setting Pacing Pulse Width: 0.4 ms
Lead Channel Setting Sensing Sensitivity: 2 mV
Zone Setting Status: 755011
Zone Setting Status: 755011

## 2022-07-21 ENCOUNTER — Telehealth: Payer: Self-pay | Admitting: Cardiology

## 2022-07-21 NOTE — Telephone Encounter (Signed)
Spoke with pt she stated that she does not fell terrible but she is having some increased Shortness of breath, a little weight gain and is still coughing. She would like to be seen tomorrow if possible. Recommended that she go to the ED of her symptoms worsen. Pt verbalized understanding and had no further questions. Sent to front desk for appt.

## 2022-07-21 NOTE — Telephone Encounter (Signed)
Patient c/o Palpitations:  High priority if patient c/o lightheadedness, shortness of breath, or chest pain  How long have you had palpitations/irregular HR/ Afib? Are you having the symptoms now?   Yes  Are you currently experiencing lightheadedness, SOB or CP?   SOB  Do you have a history of afib (atrial fibrillation) or irregular heart rhythm?   Yes  Have you checked your BP or HR? (document readings if available):   HR 90-120 on oximeter (patient states normally it is in the 60's)  Are you experiencing any other symptoms?  Slight headache   Patient stated when she walks any distance she gets SOB.

## 2022-07-22 ENCOUNTER — Ambulatory Visit: Payer: Medicare Other | Attending: Cardiology | Admitting: Cardiology

## 2022-07-22 ENCOUNTER — Encounter: Payer: Self-pay | Admitting: Cardiology

## 2022-07-22 VITALS — BP 124/80 | HR 95 | Ht 63.0 in | Wt 186.0 lb

## 2022-07-22 DIAGNOSIS — Z95818 Presence of other cardiac implants and grafts: Secondary | ICD-10-CM

## 2022-07-22 DIAGNOSIS — I2510 Atherosclerotic heart disease of native coronary artery without angina pectoris: Secondary | ICD-10-CM | POA: Diagnosis not present

## 2022-07-22 DIAGNOSIS — I340 Nonrheumatic mitral (valve) insufficiency: Secondary | ICD-10-CM

## 2022-07-22 DIAGNOSIS — I4892 Unspecified atrial flutter: Secondary | ICD-10-CM | POA: Diagnosis not present

## 2022-07-22 DIAGNOSIS — Z950 Presence of cardiac pacemaker: Secondary | ICD-10-CM

## 2022-07-22 HISTORY — DX: Unspecified atrial flutter: I48.92

## 2022-07-22 MED ORDER — AMIODARONE HCL 200 MG PO TABS: 200.0000 mg | ORAL_TABLET | Freq: Two times a day (BID) | ORAL | 3 refills | Status: DC

## 2022-07-22 NOTE — Progress Notes (Signed)
Cardiology Office Note:    Date:  07/22/2022   ID:  Amanda [name], DOB 1940-06-22, MRN [id number]  PCP:  Street, Amanda Coup, MD  Cardiologist:  Amanda Balsam, MD    Referring MD: Street, Amanda [name], *   Chief Complaint  Patient presents with   Atrial Fibrillation    History of Present Illness:    Amanda [name] is a 82 y.o. female past medical history significant for paroxysmal atrial fibrillation, coronary artery disease, status post PTCA and stenting of the mid RCA in 2018, history of CVA she is not on anticoagulated because of frequent GI bleed however she does have a Watchman device, chronic kidney failure with creatinine neighborhood of 1.7, diabetes, essential hypertension, obstructive sleep apnea.  She called yesterday complaining of feeling poorly she was complaining of any shortness of breath and tachycardia we advised to go to the emergency room however she preferred to see [place] in the office so she came today to our office.  She says she has been having some cold-like symptoms for few days but getting better she was coughing a lot 2 days ago she started feeling palpitations heart speeding up he also was complaining from some shortness of breath but no proximal nocturnal dyspnea no chest pain tightness squeezing pressure burning chest no swelling of lower extremities  Past Medical History:  Diagnosis Date   Acute ischemic stroke (HCC)    Acute metabolic encephalopathy    Acute on chronic systolic (congestive) heart failure (HCC)    Acute renal insufficiency 09/02/2016   Anemia due to stage 4 chronic kidney disease (HCC) 04/04/2015   Atrial fibrillation (HCC)    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) 04/04/2015   Bradycardia 09/17/2014   Chest pain 06/23/2016   Overview:  Added automatically from request for surgery [id number]   Chronic anemia    Chronic kidney disease (CKD), stage III (moderate) (HCC)    Coronary artery disease  10/12/2016   Non drug-eluting stent implanted in June 2018 to mid RCA   08/08/2016 10:37  Angiographic Findings  Cardiac Arteries and Lesion Findings LMCA: 0% and Normal. LAD: 0% and Normal. RCA:   Lesion on Mid RCA: Mid subsection.95% stenosis reduced to 0%. Pre procedure   TIMI II flow was noted. Post Procedure TIMI III flow was present. Poor run   off was present. The lesion was diagnosed as High Risk (C).    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   History of cardiac monitoring 02/23/2018   monitor inserted   History of iron deficiency anemia 12/28/2017   Hyperlipidemia    Hypertensive heart disease with heart failure (HCC)    Iron deficiency anemia 12/28/2017   LV dysfunction 09/02/2016   Myocardial infarction [facility])    NSTEMI (non-ST elevated myocardial infarction) (HCC) 08/06/2016   Obstructive sleep apnea 09/17/2014   Orthostatic hypotension 12/28/2017   OSA on CPAP    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   Postural dizziness with presyncope 09/23/2017   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/24/2017   Recurrent left pleural effusion 03/14/2015   Renal failure, chronic, stage 3 (moderate) (HCC)    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   Syncope 11/04/2017   Systolic heart failure (HCC)  Thyroid disease    Vitamin D deficiency 04/04/2015    Past Surgical History:  Procedure Laterality Date   BUBBLE STUDY  05/16/2020   Procedure: BUBBLE STUDY;  Surgeon: Amanda Poisson, MD;  Location: [facility] ENDOSCOPY;  Service: Cardiovascular;;   CARDIAC CATHETERIZATION     CARDIOVERSION N/A 05/16/2020   Procedure: CARDIOVERSION;  Surgeon: Amanda Poisson, MD;  Location: [facility] ENDOSCOPY;  Service: Cardiovascular;  Laterality: N/A;    CARDIOVERSION N/A 09/06/2020   Procedure: CARDIOVERSION;  Surgeon: Amanda Ishikawa, MD;  Location: [facility] ENDOSCOPY;  Service: Cardiovascular;  Laterality: N/A;   CARDIOVERSION N/A 05/16/2021   Procedure: CARDIOVERSION;  Surgeon: Amanda Reichert, MD;  Location: [facility] ENDOSCOPY;  Service: Cardiovascular;  Laterality: N/A;   COLONOSCOPY  11/21/2014   Moderate predominantly sigmoid diverticulosis. Otherwise noraml collonscopy to TI.    CORONARY ANGIOPLASTY WITH STENT PLACEMENT  08/2016   EXCISIONAL HEMORRHOIDECTOMY     LEFT ATRIAL APPENDAGE OCCLUSION  11/2016   in [place]   LOOP RECORDER INSERTION N/A 02/23/2018   Procedure: LOOP RECORDER INSERTION;  Surgeon: Amanda Lemming, MD;  Location: [facility] INVASIVE CV LAB;  Service: Cardiovascular;  Laterality: N/A;   LOOP RECORDER REMOVAL N/A 04/24/2019   Procedure: LOOP RECORDER REMOVAL;  Surgeon: Amanda Lemming, MD;  Location: [facility] INVASIVE CV LAB;  Service: Cardiovascular;  Laterality: N/A;   NOSE SURGERY     PACEMAKER IMPLANT N/A 04/24/2019   Procedure: PACEMAKER IMPLANT;  Surgeon: Amanda Lemming, MD;  Location: [facility] INVASIVE CV LAB;  Service: Cardiovascular;  Laterality: N/A;   TEE WITHOUT CARDIOVERSION N/A 05/16/2020   Procedure: TRANSESOPHAGEAL ECHOCARDIOGRAM (TEE);  Surgeon: Amanda Poisson, MD;  Location: [facility] ENDOSCOPY;  Service: Cardiovascular;  Laterality: N/A;   TOTAL HIP REVISION Left 03/25/2021   Procedure: LEFT POSTERIOR TOTAL HIP  ZIMMER CABLES;  Surgeon: Amanda Romans, MD;  Location: [facility] ORS;  Service: Orthopedics;  Laterality: Left;    Current Medications: Current Meds  Medication Sig   albuterol (VENTOLIN HFA) 108 (90 Base) MCG/ACT inhaler Inhale 2 puffs into the lungs every 6 (six) hours as needed for wheezing or shortness of breath.   amiodarone (PACERONE) 200 MG tablet TAKE 1 TABLET BY MOUTH DAILY.   amLODipine (NORVASC) 5 MG tablet TAKE 1 TABLET BY MOUTH DAILY   BIOTIN PO Take 1,000 mcg by mouth 2 (two) times daily.    blood glucose meter kit and supplies KIT Dispense based on patient and insurance preference. Use up to four times daily as directed. (Patient taking differently: Inject 1 each into the skin as directed. Dispense based on patient and insurance preference. Use up to four times daily as directed.)   carboxymethylcellul-glycerin (REFRESH RELIEVA) 0.5-0.9 % ophthalmic solution Place 1 drop into both eyes daily as needed for dry eyes. VE   Cholecalciferol (VITAMIN D3) 50 MCG (2000 UT) TABS Take 2,000 Units by mouth daily at 12 noon.   Coenzyme Q10 10 MG capsule Take 30 mg by mouth 2 (two) times daily.   colchicine 0.6 MG tablet Take 0.6 mg by mouth daily as needed (gout). For gout flare ups   denosumab (PROLIA) 60 MG/ML SOSY injection Inject 60 mg into the skin every 6 (six) months.   docusate sodium (COLACE) 100 MG capsule Take 100 mg by mouth 2 (two) times daily.   estradiol (ESTRACE) 0.1 MG/GM vaginal cream Place 1 Applicatorful vaginally every Monday, Wednesday, and Friday.    Evolocumab (REPATHA SURECLICK) 140 MG/ML SOAJ Inject 140 mg into the skin every  14 (fourteen) days.   famotidine (PEPCID) 40 MG tablet Take 40 mg by mouth daily.   febuxostat (ULORIC) 40 MG tablet Take 40 mg by mouth at bedtime.   feeding supplement, GLUCERNA SHAKE, (GLUCERNA SHAKE) LIQD Take 237 mLs by mouth 3 (three) times daily between meals. (Patient taking differently: Take 237 mLs by mouth daily.)   ferrous sulfate 325 (65 FE) MG EC tablet Take 325 mg by mouth 2 (two) times daily with breakfast and lunch.   folic acid (FOLVITE) 800 MCG tablet Take 800 mcg by mouth daily at 12 noon.   furosemide (LASIX) 20 MG tablet Take 20 mg by mouth daily.   Glucosamine-Chondroitin (OSTEO BI-FLEX REGULAR STRENGTH PO) Take 25 mcg by mouth daily at 12 noon. Plus D3   hydrALAZINE (APRESOLINE) 50 MG tablet Take 1 tablet (50 mg total) by mouth every 6 (six) hours.   isosorbide mononitrate (IMDUR) 30 MG 24 hr tablet Take 1 tablet (30 mg total)  by mouth daily.   Lactobacillus (FLORAJEN ACIDOPHILUS) CAPS Take 1 capsule by mouth daily at 12 noon. 390 mg each   levothyroxine (SYNTHROID, LEVOTHROID) 50 MCG tablet Take 50 mcg by mouth daily before breakfast.   Multiple Vitamins-Minerals (PRESERVISION AREDS 2) CAPS Take 1 capsule by mouth 2 (two) times daily.   NITROGLYCERIN PO Take 0.4 mg by mouth daily as needed (Chest pain). For chest pain   omega-3 acid ethyl esters (LOVAZA) 1 g capsule Take 2 capsules (2 g total) by mouth daily.   pantoprazole (PROTONIX) 40 MG tablet Take 1 tablet (40 mg total) by mouth daily.   polyethylene glycol powder (GLYCOLAX/MIRALAX) powder Take 17 g by mouth every other day. Alternating between miralax and benefiber   pravastatin (PRAVACHOL) 40 MG tablet Take 40 mg by mouth at bedtime.   ranolazine (RANEXA) 500 MG 12 hr tablet Take 1 tablet (500 mg total) by mouth 2 (two) times daily.   vitamin B-12 (CYANOCOBALAMIN) 1000 MCG tablet Take 1,000 mcg by mouth daily at 12 noon.   Wheat Dextrin (BENEFIBER DRINK MIX PO) Take 1 Scoop by mouth daily. Mix 1 capful with beverage and drink every other day     Allergies:   Atorvastatin, Ciprofloxacin, Contrast media [iodinated contrast media], Nsaids, Rosuvastatin, and Statins   Social History   Socioeconomic History   Marital status: Married    Spouse name: Not on file   Number of children: 2   Years of education: Not on file   Highest education level: Not on file  Occupational History   Occupation: Retired   Tobacco Use   Smoking status: Former   Smokeless tobacco: Former    Quit date: 1997   Tobacco comments:    Former smoker 05/22/21  Vaping Use   Vaping Use: Never used  Substance and Sexual Activity   Alcohol use: Not Currently    Alcohol/week: 2.0 standard drinks of alcohol    Types: 2 Standard drinks or equivalent per week   Drug use: No   Sexual activity: Not on file  Other Topics Concern   Not on file  Social History Narrative   Not on file    Social Determinants of Health   Financial Resource Strain: Not on file  Food Insecurity: Not on file  Transportation Needs: Not on file  Physical Activity: Not on file  Stress: Not on file  Social Connections: Not on file     Family History: The patient's family history includes Breast cancer in her mother; Cancer in her  maternal grandfather; Diabetes in her sister; Heart attack in her brother, maternal uncle, and sister; Hypertension in her father; Prostate cancer in her father; Stroke in her father. There is no history of Esophageal cancer. ROS:   Please see the history of present illness.    All 14 point review of systems negative except as described per history of present illness  EKGs/Labs/Other Studies Reviewed:      Recent Labs: 01/07/2022: NT-Pro BNP 1,181 01/29/2022: BUN 37; Creatinine, Ser 1.78; Potassium 4.8; Sodium 141 06/01/2022: ALT 13; TSH 3.240  Recent Lipid Panel    Component Value Date/Time   CHOL 125 09/05/2019 0842   TRIG 162 (H) 09/05/2019 0842   HDL 59 09/05/2019 0842   CHOLHDL 2.1 09/05/2019 0842   LDLCALC 39 09/05/2019 0842    Physical Exam:    VS:  BP 124/80 (BP Location: Left Arm, Patient Position: Sitting, Cuff Size: Normal)   Pulse 95   Ht 5\' 3"  (1.6 m)   Wt 186 lb (84.4 kg)   SpO2 98%   BMI 32.95 kg/m     Wt Readings from Last 3 Encounters:  07/22/22 186 lb (84.4 kg)  06/01/22 179 lb 9.6 oz (81.5 kg)  04/17/22 178 lb 3.2 oz (80.8 kg)     GEN:  Well nourished, well developed in no acute distress HEENT: Normal NECK: No JVD; No carotid bruits LYMPHATICS: No lymphadenopathy CARDIAC: Tachycardic, no murmurs, no rubs, no gallops RESPIRATORY:  Clear to auscultation without rales, wheezing or rhonchi  ABDOMEN: Soft, non-tender, non-distended MUSCULOSKELETAL:  No edema; No deformity  SKIN: Warm and dry LOWER EXTREMITIES: no swelling NEUROLOGIC:  Alert and oriented x 3 PSYCHIATRIC:  Normal affect   ASSESSMENT:    1. Paroxysmal  atrial flutter (HCC)   2. Coronary artery disease involving native coronary artery of native heart without angina pectoris   3. Nonrheumatic mitral valve regurgitation   4. Presence of Watchman left atrial appendage closure device   5. Pacemaker Medtronic device    PLAN:    In order of problems listed above:  Paroxysmal atrial flutter tachycardic will increase dose of amiodarone to 200 mg twice daily, she is not anticoagulated because of GI bleeds, she does have Watchman device however bring her back to the office in about 1 week to see what the rhythm is and see if we need to do anything else to get her back to normal rhythm. Coronary disease asymptomatic denies having chest pain tightness squeezing pressure burning chest. Nonrheumatic mitral valve regurgitation.  Moderate on last examination.  Will check Chem-7 today as well as magnesium level. Presence of Watchman device.  Noted   Medication Adjustments/Labs and Tests Ordered: Current medicines are reviewed at length with the patient today.  Concerns regarding medicines are outlined above.  No orders of the defined types were placed in this encounter.  Medication changes: No orders of the defined types were placed in this encounter.   Signed, Georgeanna Lea, MD, [facility] 07/22/2022 11:39 AM    [facility]

## 2022-07-22 NOTE — Addendum Note (Signed)
Addended by: Baldo Ash D on: 07/22/2022 11:49 AM   Modules accepted: Orders

## 2022-07-22 NOTE — Patient Instructions (Addendum)
Medication Instructions:   INCREASE: Amiodarone to 200mg  1twice daily   Lab Work: BMP, CBC, MGM- today If you have labs (blood work) drawn today and your tests are completely normal, you will receive your results only by: MyChart Message (if you have MyChart) OR A paper copy in the mail If you have any lab test that is abnormal or we need to change your treatment, we will call you to review the results.   Testing/Procedures: None Ordered   Follow-Up: At [facility], you and your health needs are our priority.  As part of our continuing mission to provide you with exceptional heart care, we have created designated Provider Care Teams.  These Care Teams include your primary Cardiologist (physician) and Advanced Practice Providers (APPs -  Physician Assistants and Nurse Practitioners) who all work together to provide you with the care you need, when you need it.  We recommend signing up for the patient portal called "MyChart".  Sign up information is provided on this After Visit Summary.  MyChart is used to connect with patients for Virtual Visits (Telemedicine).  Patients are able to view lab/test results, encounter notes, upcoming appointments, etc.  Non-urgent messages can be sent to your provider as well.   To learn more about what you can do with MyChart, go to [email or web address].    Your next appointment:   1 week(s)  The format for your next appointment:   In Person  Provider:   Gypsy Balsam, MD    Other Instructions NA

## 2022-07-23 LAB — CBC
Hematocrit: 29.3 % — ABNORMAL LOW (ref 34.0–46.6)
Hemoglobin: 9.1 g/dL — ABNORMAL LOW (ref 11.1–15.9)
MCH: 29.9 pg (ref 26.6–33.0)
MCHC: 31.1 g/dL — ABNORMAL LOW (ref 31.5–35.7)
MCV: 96 fL (ref 79–97)
Platelets: 272 10*3/uL (ref 150–450)
RBC: 3.04 x10E6/uL — ABNORMAL LOW (ref 3.77–5.28)
RDW: 12.1 % (ref 11.7–15.4)
WBC: 6.9 10*3/uL (ref 3.4–10.8)

## 2022-07-23 LAB — BASIC METABOLIC PANEL
BUN/Creatinine Ratio: 19 (ref 12–28)
BUN: 38 mg/dL — ABNORMAL HIGH (ref 8–27)
CO2: 19 mmol/L — ABNORMAL LOW (ref 20–29)
Calcium: 10.2 mg/dL (ref 8.7–10.3)
Chloride: 111 mmol/L — ABNORMAL HIGH (ref 96–106)
Creatinine, Ser: 1.95 mg/dL — ABNORMAL HIGH (ref 0.57–1.00)
Glucose: 190 mg/dL — ABNORMAL HIGH (ref 70–99)
Potassium: 4.3 mmol/L (ref 3.5–5.2)
Sodium: 145 mmol/L — ABNORMAL HIGH (ref 134–144)
eGFR: 25 mL/min/{1.73_m2} — ABNORMAL LOW (ref >59)

## 2022-07-23 LAB — MAGNESIUM: Magnesium: 2.1 mg/dL (ref 1.6–2.3)

## 2022-07-27 ENCOUNTER — Telehealth: Payer: Self-pay

## 2022-07-27 NOTE — Telephone Encounter (Signed)
LVM and My Chart Message per DPR- per Dr. Krasowski's note regarding normal lab results. Encouraged to call with any questions or concerns  

## 2022-07-29 NOTE — Progress Notes (Unsigned)
Cardiology Office Note:  .   Date:  07/30/2022  ID:  Amanda [name], DOB April 17, 1940, MRN [id number] PCP: Street, Stephanie Coup, MD  [facility] Providers Cardiologist:  Gypsy Balsam, MD Electrophysiologist:  Will Jorja Loa, MD    History of Present Illness: .   Amanda [name] is a 82 y.o. female with a history of atrial fibrillation, history of frequent GI bleeds not on anticoagulation, CKD stage IV, CAD s/p PTCA DES mid RCA 2018, history of CVA, complete heart block/PPM, systolic heart failure, severe MR, OSA on CPAP, DM2, anemia due to CKD, dyslipidemia, presence of Watchman device 2018.  Most recently evaluated by Dr. Bing Matter on 07/22/2022, she came in with complaints of feeling short of breath and tachycardia.  She had been experiencing URI symptoms for the last few days and noticed her heart was beating and having palpitations.  She was noted to be in paroxysmal atrial flutter tachycardia, her amiodarone was increased to 200 mg twice daily.  Lab work revealed creatinine 1.95, sodium 145, potassium 4.3, stable H&H, magnesium 2.1.  She presents today for follow up of her atrial fibrillation. She is still feeling poorly and developed seemingly new pedal edema. She doubled her dose of lasix and noted no improvement. She did have a nose bleed this morning that lasted ~ 15 minutes. She does not notice palpitations but can tell that something is just not right when she is out of rhythm. She denies chest pain, palpitations, dyspnea, pnd, orthopnea, n, v, dizziness, syncope, edema, weight gain, or early satiety.     ROS: Review of Systems  Constitutional: Negative.   HENT: Negative.    Eyes: Negative.   Respiratory: Negative.    Cardiovascular:  Positive for leg swelling.  Gastrointestinal: Negative.   Genitourinary: Negative.   Musculoskeletal: Negative.   Skin: Negative.   Neurological: Negative.   Endo/Heme/Allergies:  Bruises/bleeds easily.  Psychiatric/Behavioral:  Negative.       Studies Reviewed: .        Cardiac Studies & Procedures       ECHOCARDIOGRAM  ECHOCARDIOGRAM COMPLETE 05/01/2022  Narrative ECHOCARDIOGRAM REPORT    Patient Name:   Amanda [name] Date of Exam: 05/01/2022 Medical Rec #:  [id number]       Height:       62.0 in Accession #:    [id number]      Weight:       178.2 lb Date of Birth:  1940-11-17      BSA:          1.820 m Patient Age:    81 years        BP:           126/70 mmHg Patient Gender: F               HR:           63 bpm. Exam Location:  [facility]  Procedure: 2D Echo, Cardiac Doppler, Color Doppler and Strain Analysis  Indications:    Paroxysmal atrial fibrillation (HCC) [I48.0 (ICD-10-CM)]; Chronic systolic CHF (congestive heart failure) (HCC) [I50.22 (ICD-10-CM)]; Coronary artery disease involving native coronary artery of native heart without angina pectoris [I25.10 (ICD-10-CM)]  History:        Patient has prior history of Echocardiogram examinations, most recent 12/23/2020. Previous Myocardial Infarction, Pacemaker, Mitral Valve Disease, Arrythmias:Atrial Fibrillation; Risk Factors:Hypertension.  Sonographer:    Margreta Journey RDCS Referring Phys: [id number] ROBERT J KRASOWSKI  IMPRESSIONS   1. Left ventricular ejection fraction,  by estimation, is 70 to 75%. The left ventricle has hyperdynamic function. The left ventricle has no regional wall motion abnormalities. There is moderate left ventricular hypertrophy. Left ventricular diastolic parameters are consistent with Grade II diastolic dysfunction (pseudonormalization). The average left ventricular global longitudinal strain is 16.4 %. The global longitudinal strain is abnormal. 2. Right ventricular systolic function is normal. The right ventricular size is normal. There is normal pulmonary artery systolic pressure. 3. Left atrial size was mildly dilated. 4. The mitral valve is normal in structure. Moderate mitral valve regurgitation. No  evidence of mitral stenosis. Moderate mitral annular calcification. 5. The aortic valve is calcified. There is mild calcification of the aortic valve. There is mild thickening of the aortic valve. Aortic valve regurgitation is not visualized. Mild aortic valve stenosis. Aortic valve mean gradient measures 8.4 mmHg. 6. The inferior vena cava is normal in size with greater than 50% respiratory variability, suggesting right atrial pressure of 3 mmHg.  FINDINGS Left Ventricle: Left ventricular ejection fraction, by estimation, is 70 to 75%. The left ventricle has hyperdynamic function. The left ventricle has no regional wall motion abnormalities. The average left ventricular global longitudinal strain is 16.4 %. The global longitudinal strain is abnormal. The left ventricular internal cavity size was normal in size. There is moderate left ventricular hypertrophy. Left ventricular diastolic parameters are consistent with Grade II diastolic dysfunction (pseudonormalization).  Right Ventricle: The right ventricular size is normal. No increase in right ventricular wall thickness. Right ventricular systolic function is normal. There is normal pulmonary artery systolic pressure. The tricuspid regurgitant velocity is 2.78 m/s, and with an assumed right atrial pressure of 3 mmHg, the estimated right ventricular systolic pressure is 33.9 mmHg.  Left Atrium: Left atrial size was mildly dilated.  Right Atrium: Right atrial size was normal in size.  Pericardium: There is no evidence of pericardial effusion.  Mitral Valve: The mitral valve is normal in structure. Moderate mitral annular calcification. Moderate mitral valve regurgitation. No evidence of mitral valve stenosis. MV peak gradient, 6.7 mmHg. The mean mitral valve gradient is 2.0 mmHg.  Tricuspid Valve: The tricuspid valve is normal in structure. Tricuspid valve regurgitation is mild . No evidence of tricuspid stenosis.  Aortic Valve: The aortic valve  is calcified. There is mild calcification of the aortic valve. There is mild thickening of the aortic valve. Aortic valve regurgitation is not visualized. Mild aortic stenosis is present. Aortic valve mean gradient measures 8.4 mmHg. Aortic valve peak gradient measures 14.9 mmHg. Aortic valve area, by VTI measures 1.42 cm.  Pulmonic Valve: The pulmonic valve was normal in structure. Pulmonic valve regurgitation is not visualized. No evidence of pulmonic stenosis.  Aorta: The aortic root is normal in size and structure.  Venous: The inferior vena cava is normal in size with greater than 50% respiratory variability, suggesting right atrial pressure of 3 mmHg.  IAS/Shunts: No atrial level shunt detected by color flow Doppler.  Additional Comments: A device lead is visualized in the right atrium and right ventricle.   LEFT VENTRICLE PLAX 2D LVIDd:         3.50 cm   Diastology LVIDs:         1.70 cm   LV e' medial:    5.73 cm/s LV PW:         1.70 cm   LV E/e' medial:  18.2 LV IVS:        1.80 cm   LV e' lateral:   7.58 cm/s LVOT diam:  1.90 cm   LV E/e' lateral: 13.8 LV SV:         64 LV SV Index:   35        2D Longitudinal Strain LVOT Area:     2.84 cm  2D Strain GLS Avg:     16.4 %   RIGHT VENTRICLE             IVC RV Basal diam:  3.00 cm     IVC diam: 1.20 cm RV Mid diam:    2.30 cm RV S prime:     12.53 cm/s TAPSE (M-mode): 2.0 cm  LEFT ATRIUM             Index        RIGHT ATRIUM           Index LA diam:        4.00 cm 2.20 cm/m   RA Area:     11.90 cm LA Vol (A2C):   65.7 ml 36.10 ml/m  RA Volume:   24.50 ml  13.46 ml/m LA Vol (A4C):   50.0 ml 27.47 ml/m LA Biplane Vol: 60.2 ml 33.07 ml/m AORTIC VALVE AV Area (Vmax):    1.42 cm AV Area (Vmean):   1.34 cm AV Area (VTI):     1.42 cm AV Vmax:           193.20 cm/s AV Vmean:          132.000 cm/s AV VTI:            0.451 m AV Peak Grad:      14.9 mmHg AV Mean Grad:      8.4 mmHg LVOT Vmax:         96.93  cm/s LVOT Vmean:        62.600 cm/s LVOT VTI:          0.226 m LVOT/AV VTI ratio: 0.50  AORTA Ao Root diam: 2.90 cm Ao Asc diam:  3.50 cm Ao Desc diam: 1.80 cm  MITRAL VALVE                  TRICUSPID VALVE MV Area (PHT): 3.02 cm       TR Peak grad:   30.9 mmHg MV Area VTI:   1.52 cm       TR Vmax:        278.00 cm/s MV Peak grad:  6.7 mmHg MV Mean grad:  2.0 mmHg       SHUNTS MV Vmax:       1.29 m/s       Systemic VTI:  0.23 m MV Vmean:      74.1 cm/s      Systemic Diam: 1.90 cm MV Decel Time: 251 msec MR Peak grad:    161.8 mmHg MR Mean grad:    102.0 mmHg MR Vmax:         636.00 cm/s MR Vmean:        475.5 cm/s MR PISA:         6.28 cm MR PISA Eff ROA: 38 mm MR PISA Radius:  1.00 cm MV E velocity: 104.50 cm/s MV A velocity: 75.45 cm/s MV E/A ratio:  1.39  Gypsy Balsam MD Electronically signed by Gypsy Balsam MD Signature Date/Time: 05/01/2022/12:13:11 PM    Final   TEE  ECHO TEE 05/18/2020  Narrative TRANSESOPHOGEAL ECHO REPORT    Patient Name:   JULY LINAM Date of Exam: 05/16/2020 Medical Rec #:  [id number]       Height:       63.0 in Accession #:    [id number]      Weight:       184.0 lb Date of Birth:  [birth year]      BSA:          1.866 m Patient Age:    79 years        BP:           123/54 mmHg Patient Gender: F               HR:           83 bpm. Exam Location:  Inpatient  Procedure: Transesophageal Echo, 3D Echo, Color Doppler, Cardiac Doppler and Saline Contrast Bubble Study  MODIFIED REPORT: This report was modified by Weston Brass MD on 05/18/2020 due to revision. Indications:     I48.91* Unspeicified atrial fibrillation  History:         Patient has prior history of Echocardiogram examinations, most recent 09/22/2019. CHF, Previous Myocardial Infarction and CAD, Stroke, Arrythmias:Atrial Fibrillation; Risk Factors:Dyslipidemia and Diabetes.  Sonographer:     Eulah Pont RDCS Referring Phys:  [id number] Parke Poisson Diagnosing Phys: Weston Brass MD  PROCEDURE: After discussion of the risks and benefits of a TEE, an informed consent was obtained from the patient. TEE procedure time was 48 minutes. The transesophogeal probe was passed without difficulty through the esophogus of the patient. Imaged were obtained with the patient in a left lateral decubitus position. Local oropharyngeal anesthetic was provided with Cetacaine. Sedation performed by different physician. The patient was monitored while under deep sedation. Anesthestetic sedation was provided intravenously by Anesthesiology: 522.88mg  of Propofol, 100mg  of Lidocaine. Image quality was good. The patient's vital signs; including heart rate, blood pressure, and oxygen saturation; remained stable throughout the procedure. The patient developed no complications during the procedure. A successful direct current cardioversion was performed at 120 joules with 1 attempt.  IMPRESSIONS   1. There is a 24 mm Watchman left atrial appendage occluder device seen (implant date 11/18/2016, [facility]). Device is well seated with no residual flow. No adherent thrombus. Left atrial size was moderately dilated. 2. Severe mitral valve regurgitation. 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 PISA radius 0.9 cm, Aliasing velocity 38.5 cm/s, ERO 0.39 cm2, Regurgitant volume 68 mL. Pulmonary vein systolic reversals.. The mitral valve is degenerative. Severe mitral valve regurgitation. 3. Tricuspid valve regurgitation is moderate to severe. 4. Left ventricular ejection fraction, by estimation, is 65 to 70%. The left ventricle has normal function. 5. Right ventricular systolic function is normal. The right ventricular size is normal. 6. Right atrial size was mild to moderately dilated. 7. Small fibrinous strands vs adherent thrombus on RA lead. 8. The aortic valve is abnormal. There is mild calcification of the aortic valve. Aortic valve regurgitation is not visualized. Mild to  moderate aortic valve sclerosis/calcification is present, without any evidence of aortic stenosis. Aortic valve mean gradient measures 8.0 mmHg. 9. Agitated saline contrast bubble study was negative, with no evidence of any interatrial shunt. 10. There is Moderate (Grade III) plaque involving the transverse and descending aorta.  Conclusion(s)/Recommendation(s): Successful cardioversion performed with restoration of normal sinus rhythm.  FINDINGS Left Ventricle: Left ventricular ejection fraction, by estimation, is 65 to 70%. The left ventricle has normal function. The left ventricular internal cavity size was normal in size.  Right Ventricle: The right ventricular size is normal. No increase in right ventricular wall thickness. Right  ventricular systolic function is normal.  Left Atrium: There is a 24 mm Watchman left atrial appendage occluder device seen (implant date 11/18/2016, [facility]). Device is well seated with no residual flow. No adherent thrombus. Left atrial size was moderately dilated.  Right Atrium: Right atrial size was mild to moderately dilated.  Pericardium: There is no evidence of pericardial effusion.  Mitral Valve: Severe mitral valve regurgitation. 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 PISA radius 0.9 cm, Aliasing velocity 38.5 cm/s, ERO 0.39 cm2, Regurgitant volume 68 mL. Pulmonary vein systolic reversals. The mitral valve is degenerative in appearance. Mild to moderate mitral annular calcification. Severe mitral valve regurgitation.  Tricuspid Valve: The tricuspid valve is normal in structure. Tricuspid valve regurgitation is moderate to severe.  Aortic Valve: The aortic valve is abnormal. There is mild calcification of the aortic valve. Aortic valve regurgitation is not visualized. Mild to moderate aortic valve sclerosis/calcification is present, without any evidence of aortic stenosis. Aortic valve mean gradient measures 8.0 mmHg. Aortic valve peak gradient measures 18.1  mmHg.  Pulmonic Valve: The pulmonic valve was grossly normal. Pulmonic valve regurgitation is trivial.  Aorta: The aortic root and ascending aorta are structurally normal, with no evidence of dilitation. There is moderate (Grade III) plaque involving the transverse and descending aorta.  Venous: . A pattern of systolic flow reversal, suggestive of severe mitral regurgitation is recorded from the right upper pulmonary vein and the left upper pulmonary vein.  IAS/Shunts: No atrial level shunt detected by color flow Doppler. Agitated saline contrast was given intravenously to evaluate for intracardiac shunting. Agitated saline contrast bubble study was negative, with no evidence of any interatrial shunt.   AORTIC VALVE AV Vmax:      213.00 cm/s AV Vmean:     130.000 cm/s AV VTI:       0.440 m AV Peak Grad: 18.1 mmHg AV Mean Grad: 8.0 mmHg  AORTA Ao Root diam: 2.80 cm Ao Asc diam:  3.10 cm  MR Peak grad:    101.6 mmHg MR Mean grad:    72.0 mmHg MR Vmax:         504.00 cm/s MR Vmean:        408.0 cm/s MR PISA:         5.03 cm MR PISA Eff ROA: 39 mm MR PISA Radius:  0.90 cm  Weston Brass MD Electronically signed by Weston Brass MD Signature Date/Time: 05/18/2020/3:02:16 PM    Final (Updated)   MONITORS  CARDIAC EVENT MONITOR 10/11/2017  Narrative Aleda Grana, DOB January 24, 1941, MRN [id number]  EVENT MONITOR REPORT:   Patient was monitored from 09/01/2017 to 09/30/2017. Indication:                    Paroxysmal atrial fibrillation Ordering physician:  Georgeanna Lea, MD Referring physician:  Georgeanna Lea MD  Patient wear event recorder for 26 days.  The eighth trigger events.   Baseline rhythm: Sinus rhythm   Atrial arrhythmia:.  Of APCs felt as fluttering or palpitations.  No clear-cut sustained arrhythmia.  No clear-cut atrial fibrillation identified  Ventricular arrhythmia: None  Conduction abnormality: None  Symptoms: A trigger events some  because of palpitations patient got some APCs during those episodes.   Conclusion: APCs felt as palpitations. No recurrence of atrial fibrillation noted.  Interpreting  cardiologist: Gypsy Balsam, MD Date: 10/11/2017 10:12 AM           Risk Assessment/Calculations:    CHA2DS2-VASc Score = 9   This indicates a  12.2% annual risk of stroke. The patient's score is based upon: CHF History: 1 HTN History: 1 Diabetes History: 1 Stroke History: 2 Vascular Disease History: 1 Age Score: 2 Gender Score: 1            Physical Exam:   VS:  BP 130/88 (BP Location: Right Arm, Patient Position: Sitting, Cuff Size: Normal)   Pulse 84   Ht 5\' 3"  (1.6 m)   Wt 179 lb 9.6 oz (81.5 kg)   SpO2 98%   BMI 31.81 kg/m    Wt Readings from Last 3 Encounters:  07/30/22 179 lb 9.6 oz (81.5 kg)  07/22/22 186 lb (84.4 kg)  06/01/22 179 lb 9.6 oz (81.5 kg)    GEN: Well nourished, well developed in no acute distress NECK: No JVD; No carotid bruits CARDIAC: irregularly irregular, no murmurs, rubs, gallops RESPIRATORY:  Clear to auscultation without rales, wheezing or rhonchi  ABDOMEN: Soft, non-tender, non-distended EXTREMITIES: +1 pitting edema; No deformity   ASSESSMENT AND PLAN: .   Paroxysmal atrial flutter/hypercoagulable state/presence of Watchman device - s/p DCCV x 3; unable to tolerate anticoagulation secondary to GI bleed; presence of Watchman device 2018; she is in atrial flutter today rate controlled. continue amiodarone 200 mg twice daily.  Will check TSH, LFTs today for amiodarone therapy.  Discussed with primary cardiologist, she will return in 1 week with Dr. Bing Matter, if she is still is in atrial flutter discussion will be had surrounding TEE/DCCV.  CAD - PTCA DES mid RCA 2018; Stable with no anginal symptoms. No indication for ischemic evaluation.  Is not on aspirin secondary to GI bleed.  Previously was on metoprolol however this was discontinued in 2023 secondary to a syncopal  episode.  Continue Imdur 30 mg daily, continue nitroglycerin as needed--has not needed, continue Ranexa 500 mg twice daily.  Continue Repatha continue Pravachol.   Hypertension-blood pressure is controlled at 130/88, continue Norvasc 5 mg daily, continue hydralazine 50 mg every 6 hours.  Pedal edema-prescribed Lasix 20 mg daily however she increased to 40 mg and was taken twice daily for pedal edema.  Will stop her Lasix and start her on torsemide 10 twice daily.  Will repeat BMET.  CKD stage IV-will see her nephrologist next week, he needed some lab work for her and she has a requisition, we will obtain these labs today and forward to his office as we are already obtaining labs for a cardiology perspective. Will check CBC with differential, renal function panel, vitamin D per nephrologist request.       Dispo: Return in 1 week with Dr. Bing Matter.  Signed, Flossie Dibble, NP

## 2022-07-30 ENCOUNTER — Telehealth: Payer: Self-pay | Admitting: Cardiology

## 2022-07-30 ENCOUNTER — Encounter: Payer: Self-pay | Admitting: Cardiology

## 2022-07-30 ENCOUNTER — Ambulatory Visit: Payer: Medicare Other | Attending: Cardiology | Admitting: Cardiology

## 2022-07-30 VITALS — BP 130/88 | HR 84 | Ht 63.0 in | Wt 179.6 lb

## 2022-07-30 DIAGNOSIS — E1122 Type 2 diabetes mellitus with diabetic chronic kidney disease: Secondary | ICD-10-CM

## 2022-07-30 DIAGNOSIS — R600 Localized edema: Secondary | ICD-10-CM

## 2022-07-30 DIAGNOSIS — N184 Chronic kidney disease, stage 4 (severe): Secondary | ICD-10-CM

## 2022-07-30 DIAGNOSIS — I2510 Atherosclerotic heart disease of native coronary artery without angina pectoris: Secondary | ICD-10-CM

## 2022-07-30 DIAGNOSIS — I4819 Other persistent atrial fibrillation: Secondary | ICD-10-CM

## 2022-07-30 DIAGNOSIS — I4892 Unspecified atrial flutter: Secondary | ICD-10-CM

## 2022-07-30 DIAGNOSIS — I129 Hypertensive chronic kidney disease with stage 1 through stage 4 chronic kidney disease, or unspecified chronic kidney disease: Secondary | ICD-10-CM | POA: Diagnosis not present

## 2022-07-30 MED ORDER — TORSEMIDE 10 MG PO TABS: 10.0000 mg | ORAL_TABLET | Freq: Two times a day (BID) | ORAL | 3 refills | Status: DC

## 2022-07-30 NOTE — Telephone Encounter (Signed)
Recommendations reviewed with pt as per Jennifer Woody PA's note.  Pt verbalized understanding and had no additional questions.   

## 2022-07-30 NOTE — Patient Instructions (Signed)
Medication Instructions:  Your physician has recommended you make the following change in your medication:  Stop Lasix Start Torsemide 10 mg two times daily  *If you need a refill on your cardiac medications before your next appointment, please call your pharmacy*   Lab Work: Your physician recommends that you return for lab work in: Today for a CBC, CMP, TSH, Vit d, Renal Function Panel  If you have labs (blood work) drawn today and your tests are completely normal, you will receive your results only by: MyChart Message (if you have MyChart) OR A paper copy in the mail If you have any lab test that is abnormal or we need to change your treatment, we will call you to review the results.   Testing/Procedures: NONE   Follow-Up: At [facility], you and your health needs are our priority.  As part of our continuing mission to provide you with exceptional heart care, we have created designated Provider Care Teams.  These Care Teams include your primary Cardiologist (physician) and Advanced Practice Providers (APPs -  Physician Assistants and Nurse Practitioners) who all work together to provide you with the care you need, when you need it.  We recommend signing up for the patient portal called "MyChart".  Sign up information is provided on this After Visit Summary.  MyChart is used to connect with patients for Virtual Visits (Telemedicine).  Patients are able to view lab/test results, encounter notes, upcoming appointments, etc.  Non-urgent messages can be sent to your provider as well.   To learn more about what you can do with MyChart, go to [email or web address].    Your next appointment:   2 week(s)  Provider:   Wallis Bamberg, NP Rosalita Levan)    Other Instructions

## 2022-07-31 ENCOUNTER — Telehealth: Payer: Self-pay | Admitting: *Deleted

## 2022-07-31 LAB — COMPREHENSIVE METABOLIC PANEL WITH GFR
ALT: 17 IU/L (ref 0–32)
AST: 15 IU/L (ref 0–40)
Albumin: 4.1 g/dL (ref 3.7–4.7)
Alkaline Phosphatase: 88 IU/L (ref 44–121)
BUN/Creatinine Ratio: 21 (ref 12–28)
BUN: 45 mg/dL — ABNORMAL HIGH (ref 8–27)
Bilirubin Total: <0.2 mg/dL (ref 0.0–1.2)
CO2: 23 mmol/L (ref 20–29)
Calcium: 10.7 mg/dL — ABNORMAL HIGH (ref 8.7–10.3)
Chloride: 105 mmol/L (ref 96–106)
Creatinine, Ser: 2.12 mg/dL — ABNORMAL HIGH (ref 0.57–1.00)
Globulin, Total: 2.4 g/dL (ref 1.5–4.5)
Glucose: 151 mg/dL — ABNORMAL HIGH (ref 70–99)
Potassium: 4.7 mmol/L (ref 3.5–5.2)
Sodium: 142 mmol/L (ref 134–144)
Total Protein: 6.5 g/dL (ref 6.0–8.5)
eGFR: 23 mL/min/1.73 — ABNORMAL LOW

## 2022-07-31 LAB — CBC WITH DIFFERENTIAL/PLATELET
Basophils Absolute: 0 x10E3/uL (ref 0.0–0.2)
Basos: 1 %
EOS (ABSOLUTE): 0.5 x10E3/uL — ABNORMAL HIGH (ref 0.0–0.4)
Eos: 7 %
Hematocrit: 29.4 % — ABNORMAL LOW (ref 34.0–46.6)
Hemoglobin: 9.2 g/dL — ABNORMAL LOW (ref 11.1–15.9)
Immature Grans (Abs): 0.1 x10E3/uL (ref 0.0–0.1)
Immature Granulocytes: 1 %
Lymphocytes Absolute: 0.9 x10E3/uL (ref 0.7–3.1)
Lymphs: 15 %
MCH: 29.9 pg (ref 26.6–33.0)
MCHC: 31.3 g/dL — ABNORMAL LOW (ref 31.5–35.7)
MCV: 96 fL (ref 79–97)
Monocytes Absolute: 0.6 x10E3/uL (ref 0.1–0.9)
Monocytes: 9 %
Neutrophils Absolute: 4.2 x10E3/uL (ref 1.4–7.0)
Neutrophils: 67 %
Platelets: 326 x10E3/uL (ref 150–450)
RBC: 3.08 x10E6/uL — ABNORMAL LOW (ref 3.77–5.28)
RDW: 12.7 % (ref 11.7–15.4)
WBC: 6.2 x10E3/uL (ref 3.4–10.8)

## 2022-07-31 LAB — RENAL FUNCTION PANEL: Phosphorus: 3.1 mg/dL (ref 3.0–4.3)

## 2022-07-31 LAB — TSH: TSH: 5.33 u[IU]/mL — ABNORMAL HIGH (ref 0.450–4.500)

## 2022-07-31 LAB — VITAMIN D 25 HYDROXY (VIT D DEFICIENCY, FRACTURES): Vit D, 25-Hydroxy: 29.4 ng/mL — ABNORMAL LOW (ref 30.0–100.0)

## 2022-07-31 NOTE — Telephone Encounter (Signed)
-----   Message from Flossie Dibble, NP sent at 07/31/2022 11:20 AM EDT ----- Vitamin D was low, recommend OTC supplement for Vitamin D3. CBC stable without worsening anemia or infection. Kidney function slightly worse since she took quad the amount of lasix.  TSH marginally elevated, will send results to her PCP, not sure they will want to do anything different.   She needs to return to see Dr. Kirtland Bouchard in 1 week, not with me in two weeks--so please cancel her appt with me in two weeks.

## 2022-07-31 NOTE — Telephone Encounter (Signed)
Spoke with pt and let her know about lab results. Got her scheduled with Dr. Bing Matter per Wallis Bamberg. Pt verbalized understanding and had no further questions

## 2022-08-04 DIAGNOSIS — E1122 Type 2 diabetes mellitus with diabetic chronic kidney disease: Secondary | ICD-10-CM | POA: Diagnosis not present

## 2022-08-04 DIAGNOSIS — D631 Anemia in chronic kidney disease: Secondary | ICD-10-CM | POA: Diagnosis not present

## 2022-08-04 DIAGNOSIS — N184 Chronic kidney disease, stage 4 (severe): Secondary | ICD-10-CM | POA: Diagnosis not present

## 2022-08-04 DIAGNOSIS — I4811 Longstanding persistent atrial fibrillation: Secondary | ICD-10-CM | POA: Diagnosis not present

## 2022-08-07 ENCOUNTER — Ambulatory Visit: Payer: Medicare Other | Attending: Cardiology | Admitting: Cardiology

## 2022-08-07 ENCOUNTER — Encounter: Payer: Self-pay | Admitting: Cardiology

## 2022-08-07 VITALS — BP 132/78 | HR 101 | Ht 63.0 in | Wt 184.4 lb

## 2022-08-07 DIAGNOSIS — I10 Essential (primary) hypertension: Secondary | ICD-10-CM | POA: Diagnosis not present

## 2022-08-07 DIAGNOSIS — I340 Nonrheumatic mitral (valve) insufficiency: Secondary | ICD-10-CM | POA: Diagnosis not present

## 2022-08-07 DIAGNOSIS — I4892 Unspecified atrial flutter: Secondary | ICD-10-CM | POA: Diagnosis not present

## 2022-08-07 DIAGNOSIS — I2510 Atherosclerotic heart disease of native coronary artery without angina pectoris: Secondary | ICD-10-CM

## 2022-08-07 NOTE — Progress Notes (Signed)
Remote pacemaker transmission.   

## 2022-08-07 NOTE — H&P (View-Only) (Signed)
Cardiology Office Note:    Date:  08/07/2022   ID:  Amanda [name], DOB 01-Nov-1940, MRN [id number]  PCP:  Street, Stephanie Coup, MD  Cardiologist:  Gypsy Balsam, MD    Referring MD: Street, Stephanie Coup, *   Chief Complaint  Patient presents with   Follow-up  Not doing well  History of Present Illness:    Amanda [name] is a 82 y.o. female past medical history significant for paroxysmal atrial flutter/fibrillation, coronary artery disease, status post PTCA and stenting of the mid RCA in 2018, history of CVA, she is not on anticoagulation because of frequent GI bleed, eventually she end up having Watchman device, also chronic kidney failure with creatinine neighborhood of 1.7, diabetes, essential hypertension, obstructive sleep apnea.  She showed up in my office about a month ago complaining not feeling well, she was found to be in atrial flutter.  Will try to increase dose of amiodarone hoping that she flipped back to normal rhythm but that is not the case she still feeling weak tired exhausted she also noted some swelling of lower extremities.  No dizziness no passing out but does have some shortness of breath  Past Medical History:  Diagnosis Date   Acute ischemic stroke (HCC)    Acute metabolic encephalopathy    Acute on chronic systolic (congestive) heart failure (HCC)    Acute renal insufficiency 09/02/2016   Anemia due to stage 4 chronic kidney disease (HCC) 04/04/2015   Atrial fibrillation (HCC)    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) 04/04/2015   Bradycardia 09/17/2014   Chest pain 06/23/2016   Overview:  Added automatically from request for surgery [id number]   Chronic anemia    Chronic kidney disease (CKD), stage III (moderate) (HCC)    Coronary artery disease 10/12/2016   Non drug-eluting stent implanted in June 2018 to mid RCA   08/08/2016 10:37  Angiographic Findings  Cardiac Arteries and Lesion Findings LMCA: 0% and Normal. LAD: 0%  and Normal. RCA:   Lesion on Mid RCA: Mid subsection.95% stenosis reduced to 0%. Pre procedure   TIMI II flow was noted. Post Procedure TIMI III flow was present. Poor run   off was present. The lesion was diagnosed as High Risk (C).    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   History of cardiac monitoring 02/23/2018   monitor inserted   History of iron deficiency anemia 12/28/2017   Hyperlipidemia    Hypertensive heart disease with heart failure (HCC)    Iron deficiency anemia 12/28/2017   LV dysfunction 09/02/2016   Myocardial infarction [facility])    NSTEMI (non-ST elevated myocardial infarction) (HCC) 08/06/2016   Obstructive sleep apnea 09/17/2014   Orthostatic hypotension 12/28/2017   OSA on CPAP    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   Postural dizziness with presyncope 09/23/2017   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/24/2017   Recurrent left pleural effusion 03/14/2015   Renal failure, chronic, stage 3 (moderate) (HCC)    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   Syncope 11/04/2017   Systolic heart failure (HCC)    Thyroid disease    Vitamin D deficiency 04/04/2015    Past Surgical History:  Procedure Laterality Date   BUBBLE STUDY  05/16/2020   Procedure: BUBBLE STUDY;  Surgeon: Parke Poisson, MD;  Location: [facility] ENDOSCOPY;  Service: Cardiovascular;;   CARDIAC CATHETERIZATION     CARDIOVERSION N/A 05/16/2020   Procedure: CARDIOVERSION;  Surgeon: Parke Poisson, MD;  Location: [facility] ENDOSCOPY;  Service: Cardiovascular;  Laterality: N/A;   CARDIOVERSION N/A 09/06/2020   Procedure: CARDIOVERSION;  Surgeon: Little Ishikawa, MD;  Location: [facility] ENDOSCOPY;  Service: Cardiovascular;  Laterality: N/A;   CARDIOVERSION N/A  05/16/2021   Procedure: CARDIOVERSION;  Surgeon: Quintella Reichert, MD;  Location: [facility] ENDOSCOPY;  Service: Cardiovascular;  Laterality: N/A;   COLONOSCOPY  11/21/2014   Moderate predominantly sigmoid diverticulosis. Otherwise noraml collonscopy to TI.    CORONARY ANGIOPLASTY WITH STENT PLACEMENT  08/2016   EXCISIONAL HEMORRHOIDECTOMY     LEFT ATRIAL APPENDAGE OCCLUSION  11/2016   in [place]   LOOP RECORDER INSERTION N/A 02/23/2018   Procedure: LOOP RECORDER INSERTION;  Surgeon: Regan Lemming, MD;  Location: [facility] INVASIVE CV LAB;  Service: Cardiovascular;  Laterality: N/A;   LOOP RECORDER REMOVAL N/A 04/24/2019   Procedure: LOOP RECORDER REMOVAL;  Surgeon: Regan Lemming, MD;  Location: [facility] INVASIVE CV LAB;  Service: Cardiovascular;  Laterality: N/A;   NOSE SURGERY     PACEMAKER IMPLANT N/A 04/24/2019   Procedure: PACEMAKER IMPLANT;  Surgeon: Regan Lemming, MD;  Location: [facility] INVASIVE CV LAB;  Service: Cardiovascular;  Laterality: N/A;   TEE WITHOUT CARDIOVERSION N/A 05/16/2020   Procedure: TRANSESOPHAGEAL ECHOCARDIOGRAM (TEE);  Surgeon: Parke Poisson, MD;  Location: [facility] ENDOSCOPY;  Service: Cardiovascular;  Laterality: N/A;   TOTAL HIP REVISION Left 03/25/2021   Procedure: LEFT POSTERIOR TOTAL HIP  ZIMMER CABLES;  Surgeon: Durene Romans, MD;  Location: [facility] ORS;  Service: Orthopedics;  Laterality: Left;    Current Medications: Current Meds  Medication Sig   albuterol (VENTOLIN HFA) 108 (90 Base) MCG/ACT inhaler Inhale 2 puffs into the lungs every 6 (six) hours as needed for wheezing or shortness of breath.   amiodarone (PACERONE) 200 MG tablet Take 1 tablet (200 mg total) by mouth 2 (two) times daily.   amLODipine (NORVASC) 5 MG tablet TAKE 1 TABLET BY MOUTH DAILY   BIOTIN PO Take 1,000 mcg by mouth 2 (two) times daily.   blood glucose meter kit and supplies KIT Dispense based on patient and insurance preference. Use up to four times daily as directed. (Patient taking  differently: Inject 1 each into the skin as directed. Dispense based on patient and insurance preference. Use up to four times daily as directed.)   carboxymethylcellul-glycerin (REFRESH RELIEVA) 0.5-0.9 % ophthalmic solution Place 1 drop into both eyes daily as needed for dry eyes. VE   Cholecalciferol (VITAMIN D3) 50 MCG (2000 UT) TABS Take 2,000 Units by mouth daily at 12 noon.   Coenzyme Q10 10 MG capsule Take 30 mg by mouth 2 (two) times daily.   colchicine 0.6 MG tablet Take 0.6 mg by mouth daily as needed (gout). For gout flare ups   denosumab (PROLIA) 60 MG/ML SOSY injection Inject 60 mg into the skin every 6 (six) months.   docusate sodium (COLACE) 100 MG capsule Take 100 mg by mouth 2 (two) times daily.   estradiol (ESTRACE) 0.1 MG/GM vaginal cream Place 1 Applicatorful vaginally every Monday, Wednesday, and Friday.    Evolocumab (REPATHA SURECLICK) 140 MG/ML SOAJ Inject 140 mg into the skin every 14 (fourteen) days.   famotidine (PEPCID) 40 MG tablet Take 40 mg by mouth daily.  febuxostat (ULORIC) 40 MG tablet Take 40 mg by mouth at bedtime.   feeding supplement, GLUCERNA SHAKE, (GLUCERNA SHAKE) LIQD Take 237 mLs by mouth 3 (three) times daily between meals.   ferrous sulfate 325 (65 FE) MG EC tablet Take 325 mg by mouth 2 (two) times daily with breakfast and lunch.   folic acid (FOLVITE) 800 MCG tablet Take 800 mcg by mouth daily at 12 noon.   Glucosamine-Chondroitin (OSTEO BI-FLEX REGULAR STRENGTH PO) Take 25 mcg by mouth daily at 12 noon. Plus D3   hydrALAZINE (APRESOLINE) 50 MG tablet Take 1 tablet (50 mg total) by mouth every 6 (six) hours.   isosorbide mononitrate (IMDUR) 60 MG 24 hr tablet Take 60 mg by mouth daily.   Lactobacillus (FLORAJEN ACIDOPHILUS) CAPS Take 1 capsule by mouth daily at 12 noon. 390 mg each   levothyroxine (SYNTHROID, LEVOTHROID) 50 MCG tablet Take 50 mcg by mouth daily before breakfast.   Multiple Vitamins-Minerals (PRESERVISION AREDS 2) CAPS Take 1  capsule by mouth 2 (two) times daily.   NITROGLYCERIN PO Take 0.4 mg by mouth daily as needed (Chest pain). For chest pain   omega-3 acid ethyl esters (LOVAZA) 1 g capsule Take 2 capsules (2 g total) by mouth daily.   pantoprazole (PROTONIX) 40 MG tablet Take 1 tablet (40 mg total) by mouth daily.   polyethylene glycol powder (GLYCOLAX/MIRALAX) powder Take 17 g by mouth every other day. Alternating between miralax and benefiber   pravastatin (PRAVACHOL) 40 MG tablet Take 40 mg by mouth at bedtime.   ranolazine (RANEXA) 500 MG 12 hr tablet Take 1 tablet (500 mg total) by mouth 2 (two) times daily.   torsemide (DEMADEX) 10 MG tablet Take 1 tablet (10 mg total) by mouth 2 (two) times daily.   vitamin B-12 (CYANOCOBALAMIN) 1000 MCG tablet Take 1,000 mcg by mouth daily at 12 noon.   Wheat Dextrin (BENEFIBER DRINK MIX PO) Take 1 Scoop by mouth daily. Mix 1 capful with beverage and drink every other day     Allergies:   Atorvastatin, Ciprofloxacin, Contrast media [iodinated contrast media], Nsaids, Rosuvastatin, and Statins   Social History   Socioeconomic History   Marital status: Married    Spouse name: Not on file   Number of children: 2   Years of education: Not on file   Highest education level: Not on file  Occupational History   Occupation: Retired   Tobacco Use   Smoking status: Former   Smokeless tobacco: Former    Quit date: 1997   Tobacco comments:    Former smoker 05/22/21  Vaping Use   Vaping Use: Never used  Substance and Sexual Activity   Alcohol use: Not Currently    Alcohol/week: 2.0 standard drinks of alcohol    Types: 2 Standard drinks or equivalent per week   Drug use: No   Sexual activity: Not on file  Other Topics Concern   Not on file  Social History Narrative   Not on file   Social Determinants of Health   Financial Resource Strain: Not on file  Food Insecurity: Not on file  Transportation Needs: Not on file  Physical Activity: Not on file  Stress:  Not on file  Social Connections: Not on file     Family History: The patient's family history includes Breast cancer in her mother; Cancer in her maternal grandfather; Diabetes in her sister; Heart attack in her brother, maternal uncle, and sister; Hypertension in her father; Prostate cancer in her father;  Stroke in her father. There is no history of Esophageal cancer. ROS:   Please see the history of present illness.    All 14 point review of systems negative except as described per history of present illness  EKGs/Labs/Other Studies Reviewed:    EKG Interpretation Date/Time:  Friday August 07 2022 13:26:16 EDT Ventricular Rate:  101 PR Interval:    QRS Duration:  106 QT Interval:  374 QTC Calculation: 484 R Axis:   129  Text Interpretation: Atrial flutter with rapid ventricular response with occasional ventricular-paced complexes Right axis deviation Abnormal ECG When compared with ECG of 30-Jul-2022 11:06, Vent. rate has increased BY  17 BPM Confirmed by Gypsy Balsam [phone]) on 08/07/2022 1:36:39 PM    Recent Labs: 01/07/2022: NT-Pro BNP 1,181 07/22/2022: Magnesium 2.1 07/30/2022: ALT 17; BUN 45; Creatinine, Ser 2.12; Hemoglobin 9.2; Platelets 326; Potassium 4.7; Sodium 142; TSH 5.330  Recent Lipid Panel    Component Value Date/Time   CHOL 125 09/05/2019 0842   TRIG 162 (H) 09/05/2019 0842   HDL 59 09/05/2019 0842   CHOLHDL 2.1 09/05/2019 0842   LDLCALC 39 09/05/2019 0842    Physical Exam:    VS:  BP 132/78 (BP Location: Left Arm, Patient Position: Sitting)   Pulse (!) 101   Ht 5\' 3"  (1.6 m)   Wt 184 lb 6.4 oz (83.6 kg)   SpO2 93%   BMI 32.66 kg/m     Wt Readings from Last 3 Encounters:  08/07/22 184 lb 6.4 oz (83.6 kg)  07/30/22 179 lb 9.6 oz (81.5 kg)  07/22/22 186 lb (84.4 kg)     GEN:  Well nourished, well developed in no acute distress HEENT: Normal NECK: No JVD; No carotid bruits LYMPHATICS: No lymphadenopathy CARDIAC: RRR, no murmurs, no rubs, no  gallops RESPIRATORY:  Clear to auscultation without rales, wheezing or rhonchi  ABDOMEN: Soft, non-tender, non-distended MUSCULOSKELETAL:  No edema; No deformity  SKIN: Warm and dry LOWER EXTREMITIES: no swelling NEUROLOGIC:  Alert and oriented x 3 PSYCHIATRIC:  Normal affect   ASSESSMENT:    1. Paroxysmal atrial flutter (HCC)   2. Nonrheumatic mitral valve regurgitation   3. Primary hypertension   4. Coronary artery disease involving native coronary artery of native heart without angina pectoris    PLAN:    In order of problems listed above:  Paroxysmal and now persistent atrial flutter, increasing dose of amiodarone did not improve her rhythm still in it.  I think we need to start on templating transesophageal echocardiogram with cardioversion.  I explained procedure to her again and she is willing to proceed we will try to make arrangements for next week.  In the meantime we will continue present dose of amiodarone.  Her TSH appears to be high which indicate hypothyroidism but I prefer not to touch her thyroid medication until we get her heart rate under control. Nonrheumatic mitral valve regurgitation, last echocardiogram done in March of this year showed moderate mitral regurgitation, again she will have TEE done which allowed [place] to better assess the degree of mitral regurgitation. Essential hypertension blood pressure seems to be well-controlled right now continue present management. Dyslipidemia I did review K PN which show me total cholesterol 129 HDL 47 LDL 39.  Will continue present management. Patient will be scheduled to have elective transesophageal echocardiogram with cardioversion at [facility].  Explained procedure to her.  She does have Watchman device but at the same time she is very high risk for potential stroke, that  is the reason why want her to have TEE done before cardioversion.  Her CHADS2 Vascor equals 6   Medication Adjustments/Labs and Tests Ordered: Current  medicines are reviewed at length with the patient today.  Concerns regarding medicines are outlined above.  Orders Placed This Encounter  Procedures   EKG 12-Lead   Medication changes: No orders of the defined types were placed in this encounter.   Signed, Georgeanna Lea, MD, [facility] 08/07/2022 1:46 PM    [facility]

## 2022-08-07 NOTE — Patient Instructions (Signed)
Medication Instructions:  Your physician recommends that you continue on your current medications as directed. Please refer to the Current Medication list given to you today.  *If you need a refill on your cardiac medications before your next appointment, please call your pharmacy*   Lab Work: None Ordered If you have labs (blood work) drawn today and your tests are completely normal, you will receive your results only by: MyChart Message (if you have MyChart) OR A paper copy in the mail If you have any lab test that is abnormal or we need to change your treatment, we will call you to review the results.   Testing/Procedures:     Dear Amanda [name]  You are scheduled for a TEE (Transesophageal Echocardiogram) Guided Cardioversion on Wednesday, July 10 with Dr. Servando Salina.  Please arrive at the [facility] (Main Entrance A) at [facility]: [address] [place], [place] [address] at 9:30 AM (This time is 1.5 hour(s) before your procedure to ensure your preparation). Free valet parking service is available. You will check in at ADMITTING. The support person will be asked to wait in the waiting room.  It is OK to have someone drop you off and come back when you are ready to be discharged.      DIET:  Nothing to eat or drink after midnight except a sip of water with medications (see medication instructions below)  MEDICATION INSTRUCTIONS: Hold fluid pill morning of procedure  LABS: Completed CMP, CBC on 07-30-22   FYI:  For your safety, and to allow [place] to monitor your vital signs accurately during the surgery/procedure we request: If you have artificial nails, gel coating, SNS etc, please have those removed prior to your surgery/procedure. Not having the nail coverings /polish removed may result in cancellation or delay of your surgery/procedure.  You must have a responsible person to drive you home and stay in the waiting area during your procedure. Failure to do so could result in  cancellation.  Bring your insurance cards.  *Special Note: Every effort is made to have your procedure done on time. Occasionally there are emergencies that occur at the hospital that may cause delays. Please be patient if a delay does occur.       Follow-Up: At [facility], you and your health needs are our priority.  As part of our continuing mission to provide you with exceptional heart care, we have created designated Provider Care Teams.  These Care Teams include your primary Cardiologist (physician) and Advanced Practice Providers (APPs -  Physician Assistants and Nurse Practitioners) who all work together to provide you with the care you need, when you need it.  We recommend signing up for the patient portal called "MyChart".  Sign up information is provided on this After Visit Summary.  MyChart is used to connect with patients for Virtual Visits (Telemedicine).  Patients are able to view lab/test results, encounter notes, upcoming appointments, etc.  Non-urgent messages can be sent to your provider as well.   To learn more about what you can do with MyChart, go to [email or web address].    Your next appointment:   6 week(s)  The format for your next appointment:   In Person  Provider:   Gypsy Balsam, MD    Other Instructions NA

## 2022-08-07 NOTE — Progress Notes (Signed)
Cardiology Office Note:    Date:  08/07/2022   ID:  Amanda [name], DOB 11/09/1940, MRN [id number]  PCP:  Street, Christopher M, MD  Cardiologist:  Edwardo Wojnarowski, MD    Referring MD: Street, Christopher M, *   Chief Complaint  Patient presents with   Follow-up  Not doing well  History of Present Illness:    Amanda [name] is a 82 y.o. female past medical history significant for paroxysmal atrial flutter/fibrillation, coronary artery disease, status post PTCA and stenting of the mid RCA in 2018, history of CVA, she is not on anticoagulation because of frequent GI bleed, eventually she end up having Watchman device, also chronic kidney failure with creatinine neighborhood of 1.7, diabetes, essential hypertension, obstructive sleep apnea.  She showed up in my office about a month ago complaining not feeling well, she was found to be in atrial flutter.  Will try to increase dose of amiodarone hoping that she flipped back to normal rhythm but that is not the case she still feeling weak tired exhausted she also noted some swelling of lower extremities.  No dizziness no passing out but does have some shortness of breath  Past Medical History:  Diagnosis Date   Acute ischemic stroke (HCC)    Acute metabolic encephalopathy    Acute on chronic systolic (congestive) heart failure (HCC)    Acute renal insufficiency 09/02/2016   Anemia due to stage 4 chronic kidney disease (HCC) 04/04/2015   Atrial fibrillation (HCC)    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) 04/04/2015   Bradycardia 09/17/2014   Chest pain 06/23/2016   Overview:  Added automatically from request for surgery [id number]   Chronic anemia    Chronic kidney disease (CKD), stage III (moderate) (HCC)    Coronary artery disease 10/12/2016   Non drug-eluting stent implanted in June 2018 to mid RCA   08/08/2016 10:37  Angiographic Findings  Cardiac Arteries and Lesion Findings LMCA: 0% and Normal. LAD: 0%  and Normal. RCA:   Lesion on Mid RCA: Mid subsection.95% stenosis reduced to 0%. Pre procedure   TIMI II flow was noted. Post Procedure TIMI III flow was present. Poor run   off was present. The lesion was diagnosed as High Risk (C).    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   History of cardiac monitoring 02/23/2018   monitor inserted   History of iron deficiency anemia 12/28/2017   Hyperlipidemia    Hypertensive heart disease with heart failure (HCC)    Iron deficiency anemia 12/28/2017   LV dysfunction 09/02/2016   Myocardial infarction (HCC)    NSTEMI (non-ST elevated myocardial infarction) (HCC) 08/06/2016   Obstructive sleep apnea 09/17/2014   Orthostatic hypotension 12/28/2017   OSA on CPAP    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   Postural dizziness with presyncope 09/23/2017   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/24/2017   Recurrent left pleural effusion 03/14/2015   Renal failure, chronic, stage 3 (moderate) (HCC)    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   Syncope 11/04/2017   Systolic heart failure (HCC)    Thyroid disease    Vitamin D deficiency 04/04/2015    Past Surgical History:  Procedure Laterality Date   BUBBLE STUDY    05/16/2020   Procedure: BUBBLE STUDY;  Surgeon: Acharya, Gayatri A, MD;  Location: [facility];  Service: Cardiovascular;;   CARDIAC CATHETERIZATION     CARDIOVERSION N/A 05/16/2020   Procedure: CARDIOVERSION;  Surgeon: Acharya, Gayatri A, MD;  Location: [facility];  Service: Cardiovascular;  Laterality: N/A;   CARDIOVERSION N/A 09/06/2020   Procedure: CARDIOVERSION;  Surgeon: Schumann, Christopher L, MD;  Location: [facility];  Service: Cardiovascular;  Laterality: N/A;   CARDIOVERSION N/A  05/16/2021   Procedure: CARDIOVERSION;  Surgeon: Turner, Traci R, MD;  Location: [facility];  Service: Cardiovascular;  Laterality: N/A;   COLONOSCOPY  11/21/2014   Moderate predominantly sigmoid diverticulosis. Otherwise noraml collonscopy to TI.    CORONARY ANGIOPLASTY WITH STENT PLACEMENT  08/2016   EXCISIONAL HEMORRHOIDECTOMY     LEFT ATRIAL APPENDAGE OCCLUSION  11/2016   in [place]   LOOP RECORDER INSERTION N/A 02/23/2018   Procedure: LOOP RECORDER INSERTION;  Surgeon: Camnitz, Will Martin, MD;  [facility]: [facility];  Service: Cardiovascular;  Laterality: N/A;   LOOP RECORDER REMOVAL N/A 04/24/2019   Procedure: LOOP RECORDER REMOVAL;  Surgeon: Camnitz, Will Martin, MD;  [facility];  Service: Cardiovascular;  Laterality: N/A;   NOSE SURGERY     PACEMAKER IMPLANT N/A 04/24/2019   Procedure: PACEMAKER IMPLANT;  Surgeon: Camnitz, Will Martin, MD;  [facility];  Service: Cardiovascular;  Laterality: N/A;   TEE WITHOUT CARDIOVERSION N/A 05/16/2020   Procedure: TRANSESOPHAGEAL ECHOCARDIOGRAM (TEE);  Surgeon: Acharya, Gayatri A, MD;  Location: [facility];  Service: Cardiovascular;  Laterality: N/A;   TOTAL HIP REVISION Left 03/25/2021   Procedure: LEFT POSTERIOR TOTAL HIP  ZIMMER CABLES;  Surgeon: Olin, Matthew, MD;  Location: [facility];  Service: Orthopedics;  Laterality: Left;    Current Medications: Current Meds  Medication Sig   albuterol (VENTOLIN HFA) 108 (90 Base) MCG/ACT inhaler Inhale 2 puffs into the lungs every 6 (six) hours as needed for wheezing or shortness of breath.   amiodarone (PACERONE) 200 MG tablet Take 1 tablet (200 mg total) by mouth 2 (two) times daily.   amLODipine (NORVASC) 5 MG tablet TAKE 1 TABLET BY MOUTH DAILY   BIOTIN PO Take 1,000 mcg by mouth 2 (two) times daily.   blood glucose meter kit and supplies KIT Dispense based on patient and insurance preference. Use up to four times daily as directed. (Patient taking  differently: Inject 1 each into the skin as directed. Dispense based on patient and insurance preference. Use up to four times daily as directed.)   carboxymethylcellul-glycerin (REFRESH RELIEVA) 0.5-0.9 % ophthalmic solution Place 1 drop into both eyes daily as needed for dry eyes. VE   Cholecalciferol (VITAMIN D3) 50 MCG (2000 UT) TABS Take 2,000 Units by mouth daily at 12 noon.   Coenzyme Q10 10 MG capsule Take 30 mg by mouth 2 (two) times daily.   colchicine 0.6 MG tablet Take 0.6 mg by mouth daily as needed (gout). For gout flare ups   denosumab (PROLIA) 60 MG/ML SOSY injection Inject 60 mg into the skin every 6 (six) months.   docusate sodium (COLACE) 100 MG capsule Take 100 mg by mouth 2 (two) times daily.   estradiol (ESTRACE) 0.1 MG/GM vaginal cream Place 1 Applicatorful vaginally every Monday, Wednesday, and Friday.    Evolocumab (REPATHA SURECLICK) 140 MG/ML SOAJ Inject 140 mg into the skin every 14 (fourteen) days.   famotidine (PEPCID) 40 MG tablet Take 40 mg by mouth daily.     febuxostat (ULORIC) 40 MG tablet Take 40 mg by mouth at bedtime.   feeding supplement, GLUCERNA SHAKE, (GLUCERNA SHAKE) LIQD Take 237 mLs by mouth 3 (three) times daily between meals.   ferrous sulfate 325 (65 FE) MG EC tablet Take 325 mg by mouth 2 (two) times daily with breakfast and lunch.   folic acid (FOLVITE) 800 MCG tablet Take 800 mcg by mouth daily at 12 noon.   Glucosamine-Chondroitin (OSTEO BI-FLEX REGULAR STRENGTH PO) Take 25 mcg by mouth daily at 12 noon. Plus D3   hydrALAZINE (APRESOLINE) 50 MG tablet Take 1 tablet (50 mg total) by mouth every 6 (six) hours.   isosorbide mononitrate (IMDUR) 60 MG 24 hr tablet Take 60 mg by mouth daily.   Lactobacillus (FLORAJEN ACIDOPHILUS) CAPS Take 1 capsule by mouth daily at 12 noon. 390 mg each   levothyroxine (SYNTHROID, LEVOTHROID) 50 MCG tablet Take 50 mcg by mouth daily before breakfast.   Multiple Vitamins-Minerals (PRESERVISION AREDS 2) CAPS Take 1  capsule by mouth 2 (two) times daily.   NITROGLYCERIN PO Take 0.4 mg by mouth daily as needed (Chest pain). For chest pain   omega-3 acid ethyl esters (LOVAZA) 1 g capsule Take 2 capsules (2 g total) by mouth daily.   pantoprazole (PROTONIX) 40 MG tablet Take 1 tablet (40 mg total) by mouth daily.   polyethylene glycol powder (GLYCOLAX/MIRALAX) powder Take 17 g by mouth every other day. Alternating between miralax and benefiber   pravastatin (PRAVACHOL) 40 MG tablet Take 40 mg by mouth at bedtime.   ranolazine (RANEXA) 500 MG 12 hr tablet Take 1 tablet (500 mg total) by mouth 2 (two) times daily.   torsemide (DEMADEX) 10 MG tablet Take 1 tablet (10 mg total) by mouth 2 (two) times daily.   vitamin B-12 (CYANOCOBALAMIN) 1000 MCG tablet Take 1,000 mcg by mouth daily at 12 noon.   Wheat Dextrin (BENEFIBER DRINK MIX PO) Take 1 Scoop by mouth daily. Mix 1 capful with beverage and drink every other day     Allergies:   Atorvastatin, Ciprofloxacin, Contrast media [iodinated contrast media], Nsaids, Rosuvastatin, and Statins   Social History   Socioeconomic History   Marital status: Married    Spouse name: Not on file   Number of children: 2   Years of education: Not on file   Highest education level: Not on file  Occupational History   Occupation: Retired   Tobacco Use   Smoking status: Former   Smokeless tobacco: Former    Quit date: 1997   Tobacco comments:    Former smoker 05/22/21  Vaping Use   Vaping Use: Never used  Substance and Sexual Activity   Alcohol use: Not Currently    Alcohol/week: 2.0 standard drinks of alcohol    Types: 2 Standard drinks or equivalent per week   Drug use: No   Sexual activity: Not on file  Other Topics Concern   Not on file  Social History Narrative   Not on file   Social Determinants of Health   Financial Resource Strain: Not on file  Food Insecurity: Not on file  Transportation Needs: Not on file  Physical Activity: Not on file  Stress:  Not on file  Social Connections: Not on file     Family History: The patient's family history includes Breast cancer in her mother; Cancer in her maternal grandfather; Diabetes in her sister; Heart attack in her brother, maternal uncle, and sister; Hypertension in her father; Prostate cancer in her father;   Stroke in her father. There is no history of Esophageal cancer. ROS:   Please see the history of present illness.    All 14 point review of systems negative except as described per history of present illness  EKGs/Labs/Other Studies Reviewed:    EKG Interpretation Date/Time:  Friday August 07 2022 13:26:16 EDT Ventricular Rate:  101 PR Interval:    QRS Duration:  106 QT Interval:  374 QTC Calculation: 484 R Axis:   129  Text Interpretation: Atrial flutter with rapid ventricular response with occasional ventricular-paced complexes Right axis deviation Abnormal ECG When compared with ECG of 30-Jul-2022 11:06, Vent. rate has increased BY  17 BPM Confirmed by Debbe Crumble ([id number]) on 08/07/2022 1:36:39 PM    Recent Labs: 01/07/2022: NT-Pro BNP 1,181 07/22/2022: Magnesium 2.1 07/30/2022: ALT 17; BUN 45; Creatinine, Ser 2.12; Hemoglobin 9.2; Platelets 326; Potassium 4.7; Sodium 142; TSH 5.330  Recent Lipid Panel    Component Value Date/Time   CHOL 125 09/05/2019 0842   TRIG 162 (H) 09/05/2019 0842   HDL 59 09/05/2019 0842   CHOLHDL 2.1 09/05/2019 0842   LDLCALC 39 09/05/2019 0842    Physical Exam:    VS:  BP 132/78 (BP Location: Left Arm, Patient Position: Sitting)   Pulse (!) 101   Ht 5' 3" (1.6 m)   Wt 184 lb 6.4 oz (83.6 kg)   SpO2 93%   BMI 32.66 kg/m     Wt Readings from Last 3 Encounters:  08/07/22 184 lb 6.4 oz (83.6 kg)  07/30/22 179 lb 9.6 oz (81.5 kg)  07/22/22 186 lb (84.4 kg)     GEN:  Well nourished, well developed in no acute distress HEENT: Normal NECK: No JVD; No carotid bruits LYMPHATICS: No lymphadenopathy CARDIAC: RRR, no murmurs, no rubs, no  gallops RESPIRATORY:  Clear to auscultation without rales, wheezing or rhonchi  ABDOMEN: Soft, non-tender, non-distended MUSCULOSKELETAL:  No edema; No deformity  SKIN: Warm and dry LOWER EXTREMITIES: no swelling NEUROLOGIC:  Alert and oriented x 3 PSYCHIATRIC:  Normal affect   ASSESSMENT:    1. Paroxysmal atrial flutter (HCC)   2. Nonrheumatic mitral valve regurgitation   3. Primary hypertension   4. Coronary artery disease involving native coronary artery of native heart without angina pectoris    PLAN:    In order of problems listed above:  Paroxysmal and now persistent atrial flutter, increasing dose of amiodarone did not improve her rhythm still in it.  I think we need to start on templating transesophageal echocardiogram with cardioversion.  I explained procedure to her again and she is willing to proceed we will try to make arrangements for next week.  In the meantime we will continue present dose of amiodarone.  Her TSH appears to be high which indicate hypothyroidism but I prefer not to touch her thyroid medication until we get her heart rate under control. Nonrheumatic mitral valve regurgitation, last echocardiogram done in March of this year showed moderate mitral regurgitation, again she will have TEE done which allowed us to better assess the degree of mitral regurgitation. Essential hypertension blood pressure seems to be well-controlled right now continue present management. Dyslipidemia I did review K PN which show me total cholesterol 129 HDL 47 LDL 39.  Will continue present management. Patient will be scheduled to have elective transesophageal echocardiogram with cardioversion at [place].  Explained procedure to her.  She does have Watchman device but at the same time she is very high risk for potential stroke, that   is the reason why want her to have TEE done before cardioversion.  Her CHADS2 Vascor equals 6   Medication Adjustments/Labs and Tests Ordered: Current  medicines are reviewed at length with the patient today.  Concerns regarding medicines are outlined above.  Orders Placed This Encounter  Procedures   EKG 12-Lead   Medication changes: No orders of the defined types were placed in this encounter.   Signed, Idolina Mantell J. Byard Carranza, MD, FACC 08/07/2022 1:46 PM    [facility] 

## 2022-08-11 NOTE — Progress Notes (Signed)
Spoke to pt and instructed them to come at 1030 and to be NPO after 0000.   Confirmed that pt will have a ride home and someone to stay with them for 24 hours after the procedure.

## 2022-08-12 ENCOUNTER — Ambulatory Visit (HOSPITAL_COMMUNITY)
Admission: RE | Disposition: A | Discharge status: Home / Self Care | Payer: Self-pay | Source: Home / Self Care | Attending: Cardiology

## 2022-08-12 ENCOUNTER — Ambulatory Visit (HOSPITAL_BASED_OUTPATIENT_CLINIC_OR_DEPARTMENT_OTHER): Payer: Medicare Other | Admitting: Anesthesiology

## 2022-08-12 ENCOUNTER — Other Ambulatory Visit: Payer: Self-pay

## 2022-08-12 ENCOUNTER — Ambulatory Visit (HOSPITAL_COMMUNITY)
Admission: RE | Admit: 2022-08-12 | Discharge: 2022-08-12 | Disposition: A | Discharge status: Home / Self Care | Payer: Medicare Other | Attending: Cardiology | Admitting: Cardiology

## 2022-08-12 ENCOUNTER — Ambulatory Visit (HOSPITAL_BASED_OUTPATIENT_CLINIC_OR_DEPARTMENT_OTHER): Payer: Medicare Other

## 2022-08-12 ENCOUNTER — Ambulatory Visit (HOSPITAL_COMMUNITY): Payer: Medicare Other | Admitting: Anesthesiology

## 2022-08-12 DIAGNOSIS — Z8673 Personal history of transient ischemic attack (TIA), and cerebral infarction without residual deficits: Secondary | ICD-10-CM | POA: Insufficient documentation

## 2022-08-12 DIAGNOSIS — I4892 Unspecified atrial flutter: Secondary | ICD-10-CM

## 2022-08-12 DIAGNOSIS — I10 Essential (primary) hypertension: Secondary | ICD-10-CM

## 2022-08-12 DIAGNOSIS — I130 Hypertensive heart and chronic kidney disease with heart failure and stage 1 through stage 4 chronic kidney disease, or unspecified chronic kidney disease: Secondary | ICD-10-CM | POA: Diagnosis not present

## 2022-08-12 DIAGNOSIS — E785 Hyperlipidemia, unspecified: Secondary | ICD-10-CM | POA: Insufficient documentation

## 2022-08-12 DIAGNOSIS — I4819 Other persistent atrial fibrillation: Secondary | ICD-10-CM | POA: Diagnosis not present

## 2022-08-12 DIAGNOSIS — I5022 Chronic systolic (congestive) heart failure: Secondary | ICD-10-CM | POA: Insufficient documentation

## 2022-08-12 DIAGNOSIS — E1122 Type 2 diabetes mellitus with diabetic chronic kidney disease: Secondary | ICD-10-CM | POA: Diagnosis not present

## 2022-08-12 DIAGNOSIS — N184 Chronic kidney disease, stage 4 (severe): Secondary | ICD-10-CM | POA: Diagnosis not present

## 2022-08-12 DIAGNOSIS — I081 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: Secondary | ICD-10-CM | POA: Insufficient documentation

## 2022-08-12 DIAGNOSIS — I340 Nonrheumatic mitral (valve) insufficiency: Secondary | ICD-10-CM | POA: Diagnosis not present

## 2022-08-12 DIAGNOSIS — Z955 Presence of coronary angioplasty implant and graft: Secondary | ICD-10-CM | POA: Insufficient documentation

## 2022-08-12 DIAGNOSIS — G4733 Obstructive sleep apnea (adult) (pediatric): Secondary | ICD-10-CM | POA: Diagnosis not present

## 2022-08-12 DIAGNOSIS — I2510 Atherosclerotic heart disease of native coronary artery without angina pectoris: Secondary | ICD-10-CM | POA: Insufficient documentation

## 2022-08-12 DIAGNOSIS — Z87891 Personal history of nicotine dependence: Secondary | ICD-10-CM | POA: Diagnosis not present

## 2022-08-12 HISTORY — PX: TEE WITHOUT CARDIOVERSION: SHX5443

## 2022-08-12 HISTORY — PX: CARDIOVERSION: SHX1299

## 2022-08-12 LAB — ECHO TEE
MV M vel: 5.41 m/s
MV Peak grad: 117.1 mmHg
Radius: 0.6 cm

## 2022-08-12 SURGERY — CARDIOVERSION
Anesthesia: Monitor Anesthesia Care

## 2022-08-12 MED ORDER — SODIUM CHLORIDE 0.9 % IV SOLN: INTRAVENOUS | Status: DC

## 2022-08-12 MED ORDER — LIDOCAINE VISCOUS HCL 2 % MT SOLN
OROMUCOSAL | Status: AC
  Filled 2022-08-12: qty 15

## 2022-08-12 MED ORDER — PROPOFOL 10 MG/ML IV BOLUS
INTRAVENOUS | Status: DC | PRN
  Administered 2022-08-12: 80 mg via INTRAVENOUS

## 2022-08-12 MED ORDER — PROPOFOL 500 MG/50ML IV EMUL
INTRAVENOUS | Status: DC | PRN
  Administered 2022-08-12: 150 ug/kg/min via INTRAVENOUS

## 2022-08-12 SURGICAL SUPPLY — 1 items: ELECT DEFIB PAD ADLT CADENCE (PAD) ×1 IMPLANT

## 2022-08-12 NOTE — CV Procedure (Signed)
    TRANSESOPHAGEAL ECHOCARDIOGRAM   NAME:  Amanda [name]    MRN: [id number] DOB:  December 14, 1940    ADMIT DATE: 08/12/2022  INDICATIONS: Atrial flutter  PROCEDURE:   Informed consent was obtained prior to the procedure. The risks, benefits and alternatives for the procedure were discussed and the patient comprehended these risks.  Risks include, but are not limited to, cough, sore throat, vomiting, nausea, somnolence, esophageal and stomach trauma or perforation, bleeding, low blood pressure, aspiration, pneumonia, infection, trauma to the teeth and death.    Procedural time out performed. The oropharynx was anesthetized with viscous lidocaine.  Anesthesia was administered by the anaesthesilogy team to achieve and maintain moderate to deep conscious sedation.  The patient's heart rate, blood pressure, and oxygen saturation were monitored continuously during the procedure.  The transesophageal probe was inserted in the esophagus and stomach without difficulty and multiple views were obtained.   The patient tolerated the procedure well.  COMPLICATIONS:    There were no immediate complications.  KEY FINDINGS:  Normal left ventricular systolic function.  Moderate mitral regurgitation, mild tricuspid regurgitation.  Watchman in situ to in place.  Right atrial lead with notable mobile mass suspected to be a thrombus/clot. Full report to follow. Further management per primary team.    Recommendation: Will not proceed with cardioversion due to findings on the right atrial lead.  Primary cardiologist notified.  The patient was also made aware.  Thomasene Ripple, DO [facility]  [facility]  12:09 PM

## 2022-08-12 NOTE — Anesthesia Preprocedure Evaluation (Addendum)
Anesthesia Evaluation  Patient identified by MRN, date of birth, ID band Patient awake    Reviewed: Allergy & Precautions, NPO status , Patient's Chart, lab work & pertinent test results  Airway Mallampati: III  TM Distance: >3 FB Neck ROM: Full    Dental  (+) Dental Advisory Given, Teeth Intact   Pulmonary sleep apnea (noncompliant w/ cpap) , former smoker   Pulmonary exam normal breath sounds clear to auscultation       Cardiovascular hypertension, Pt. on medications pulmonary hypertension (mild pHTN)+ CAD, + Past MI and +CHF  + dysrhythmias Atrial Fibrillation + pacemaker + Valvular Problems/Murmurs (mild MS) MR and AS  Rhythm:Irregular Rate:Normal + Systolic murmurs Pacemaker interrogation AP VP Percent:0.01 % AP VS Percent:1.82 % AS VP Percent:0.04 % AS VS Percent:98.13 % RA Percent Paced:1.81 % RV Percent Paced:0.05 %   Echo 04/2022  1. Left ventricular ejection fraction, by estimation, is 70 to 75%. The left ventricle has hyperdynamic function. The left ventricle has no regional wall motion abnormalities. There is moderate left ventricular hypertrophy. Left ventricular diastolic parameters are consistent with Grade II diastolic dysfunction (pseudonormalization). The average left ventricular global longitudinal strain is 16.4 %. The global longitudinal strain is abnormal.   2. Right ventricular systolic function is normal. The right ventricular size is normal. There is normal pulmonary artery systolic pressure.   3. Left atrial size was mildly dilated.   4. The mitral valve is normal in structure. Moderate mitral valve regurgitation. No evidence of mitral stenosis. Moderate mitral annular calcification.   5. The aortic valve is calcified. There is mild calcification of the aortic valve. There is mild thickening of the aortic valve. Aortic valve regurgitation is not visualized. Mild aortic valve stenosis. Aortic valve mean  gradient measures 8.4 mmHg.   6. The inferior vena cava is normal in size with greater than 50% respiratory variability, suggesting right atrial pressure of 3 mmHg.     Echo 2023 1. Left ventricular ejection fraction, by estimation, is 60 to 65%. The  left ventricle has normal function. The left ventricle has no regional  wall motion abnormalities. There is severe concentric left ventricular  hypertrophy. Left ventricular diastolic  parameters are indeterminate.  2. Right ventricular systolic function is mildly reduced. The right  ventricular size is normal. There is mildly elevated pulmonary artery  systolic pressure.  3. Mean gradient 2 mm Hg at 57 BPM. The mitral valve is degenerative.  Moderate mitral valve regurgitation. Mild mitral stenosis. Moderate mitral  annular calcification.  4. The aortic valve is normal in structure. Aortic valve regurgitation is  not visualized. Aortic valve sclerosis is present, with no evidence of  aortic valve stenosis.  5. The inferior vena cava is normal in size with greater than 50%  respiratory variability, suggesting right atrial pressure of 3 mmHg.   S/p watchman 2021   Neuro/Psych CVA  negative psych ROS   GI/Hepatic Neg liver ROS,GERD  Controlled and Medicated,,  Endo/Other  diabetes, Well Controlled, Type 2, Insulin Dependent  a1c 7.3  Renal/GU CRFRenal diseaseCKD 3     Musculoskeletal negative musculoskeletal ROS (+)    Abdominal  (+) + obese  Peds  Hematology  (+) Blood dyscrasia, anemia   Anesthesia Other Findings   Reproductive/Obstetrics negative OB ROS                             Anesthesia Physical Anesthesia Plan  ASA: 3  Anesthesia Plan:  MAC   Post-op Pain Management: Minimal or no pain anticipated   Induction: Intravenous  PONV Risk Score and Plan: 2 and TIVA, Treatment may vary due to age or medical condition and Propofol infusion  Airway Management Planned:    Additional Equipment: None  Intra-op Plan:   Post-operative Plan:   Informed Consent: I have reviewed the patients History and Physical, chart, labs and discussed the procedure including the risks, benefits and alternatives for the proposed anesthesia with the patient or authorized representative who has indicated his/her understanding and acceptance.     Dental advisory given  Plan Discussed with: CRNA  Anesthesia Plan Comments:         Anesthesia Quick Evaluation

## 2022-08-12 NOTE — Transfer of Care (Signed)
Immediate Anesthesia Transfer of Care Note  Patient: Amanda [name]  Procedure(s) Performed: CARDIOVERSION TRANSESOPHAGEAL ECHOCARDIOGRAM  Patient Location: Cath Lab  Anesthesia Type:MAC  Level of Consciousness: drowsy  Airway & Oxygen Therapy: Patient Spontanous Breathing and Patient connected to nasal cannula oxygen  Post-op Assessment: Report given to RN and Post -op Vital signs reviewed and stable  Post vital signs: Reviewed and stable  Last Vitals:  Vitals Value Taken Time  BP 99/51 08/12/22 1202  Temp 36.7 C 08/12/22 1202  Pulse 69 08/12/22 1203  Resp 13 08/12/22 1203  SpO2 96 % 08/12/22 1203  Vitals shown include unvalidated device data.  Last Pain:  Vitals:   08/12/22 1202  TempSrc: Temporal  PainSc: 0-No pain         Complications: No notable events documented.

## 2022-08-12 NOTE — Interval H&P Note (Signed)
History and Physical Interval Note:  08/12/2022 11:25 AM  Amanda [name]  has presented today for surgery, with the diagnosis of persistent aflutter.  The various methods of treatment have been discussed with the patient and family. After consideration of risks, benefits and other options for treatment, the patient has consented to  Procedure(s): CARDIOVERSION (N/A) TRANSESOPHAGEAL ECHOCARDIOGRAM (N/A) as a surgical intervention.  The patient's history has been reviewed, patient examined, no change in status, stable for surgery.  I have reviewed the patient's chart and labs.  Questions were answered to the patient's satisfaction.     Ryken Paschal

## 2022-08-13 ENCOUNTER — Encounter (HOSPITAL_COMMUNITY): Payer: Self-pay | Admitting: Cardiology

## 2022-08-13 DIAGNOSIS — H353211 Exudative age-related macular degeneration, right eye, with active choroidal neovascularization: Secondary | ICD-10-CM | POA: Diagnosis not present

## 2022-08-13 DIAGNOSIS — H43813 Vitreous degeneration, bilateral: Secondary | ICD-10-CM | POA: Diagnosis not present

## 2022-08-13 DIAGNOSIS — H353122 Nonexudative age-related macular degeneration, left eye, intermediate dry stage: Secondary | ICD-10-CM | POA: Diagnosis not present

## 2022-08-13 DIAGNOSIS — E113213 Type 2 diabetes mellitus with mild nonproliferative diabetic retinopathy with macular edema, bilateral: Secondary | ICD-10-CM | POA: Diagnosis not present

## 2022-08-13 MED FILL — Lidocaine HCl Viscous Soln 2%: OROMUCOSAL | Qty: 15 | Status: AC

## 2022-08-14 ENCOUNTER — Ambulatory Visit: Payer: Medicare Other | Admitting: Cardiology

## 2022-08-14 ENCOUNTER — Telehealth: Payer: Self-pay | Admitting: Cardiology

## 2022-08-14 NOTE — Anesthesia Postprocedure Evaluation (Signed)
Anesthesia Post Note  Patient: Amanda [name]  Procedure(s) Performed: CARDIOVERSION TRANSESOPHAGEAL ECHOCARDIOGRAM     Patient location during evaluation: PACU Anesthesia Type: MAC Level of consciousness: awake and alert Pain management: pain level controlled Vital Signs Assessment: post-procedure vital signs reviewed and stable Respiratory status: spontaneous breathing Cardiovascular status: stable Anesthetic complications: no   No notable events documented.  Last Vitals:  Vitals:   08/12/22 1215 08/12/22 1230  BP: 124/81 139/63  Pulse: 81 74  Resp: 19 15  Temp: 36.8 C   SpO2: 98% 99%    Last Pain:  Vitals:   08/12/22 1230  TempSrc:   PainSc: 0-No pain                 Lewie Loron

## 2022-08-14 NOTE — Telephone Encounter (Signed)
Patient calling to follow up on any recommendations after her TEE.

## 2022-08-17 ENCOUNTER — Telehealth: Payer: Self-pay

## 2022-08-17 DIAGNOSIS — I4892 Unspecified atrial flutter: Secondary | ICD-10-CM

## 2022-08-17 NOTE — Telephone Encounter (Signed)
Spoke with pt regarding Dr. Vanetta Shawl note. Advised to get CBC, stool for blood, Anticoagulate for 4 weeks then do TEE and if everything ok convert. Pt Agreed and verbalized understanding.

## 2022-08-18 ENCOUNTER — Other Ambulatory Visit: Payer: Self-pay | Admitting: Cardiology

## 2022-08-18 DIAGNOSIS — I4892 Unspecified atrial flutter: Secondary | ICD-10-CM | POA: Diagnosis not present

## 2022-08-19 LAB — CBC
Hematocrit: 32.4 % — ABNORMAL LOW (ref 34.0–46.6)
Hemoglobin: 9.8 g/dL — ABNORMAL LOW (ref 11.1–15.9)
MCH: 28.6 pg (ref 26.6–33.0)
MCHC: 30.2 g/dL — ABNORMAL LOW (ref 31.5–35.7)
MCV: 95 fL (ref 79–97)
Platelets: 234 10*3/uL (ref 150–450)
RBC: 3.43 x10E6/uL — ABNORMAL LOW (ref 3.77–5.28)
RDW: 13.1 % (ref 11.7–15.4)
WBC: 4.3 10*3/uL (ref 3.4–10.8)

## 2022-08-20 ENCOUNTER — Other Ambulatory Visit: Payer: Self-pay | Admitting: Cardiology

## 2022-08-20 LAB — FECAL OCCULT BLOOD, IMMUNOCHEMICAL: Fecal Occult Bld: POSITIVE — AB

## 2022-08-25 ENCOUNTER — Telehealth: Payer: Self-pay | Admitting: Cardiology

## 2022-08-25 NOTE — Telephone Encounter (Signed)
Results reviewed with pt as per Dr. Vanetta Shawl note.  Pt verbalized understanding and had no additional questions. Routed to PCP  Advised to see PCP for positive occult

## 2022-08-25 NOTE — Telephone Encounter (Signed)
New Message:      Patient says her stool culture was positive. She wants to know wht does she need to do next?

## 2022-08-26 ENCOUNTER — Other Ambulatory Visit (INDEPENDENT_AMBULATORY_CARE_PROVIDER_SITE_OTHER): Payer: Medicare Other

## 2022-08-26 ENCOUNTER — Encounter: Payer: Self-pay | Admitting: Nurse Practitioner

## 2022-08-26 ENCOUNTER — Ambulatory Visit: Payer: Medicare Other | Admitting: Nurse Practitioner

## 2022-08-26 VITALS — BP 148/72 | HR 66 | Ht 63.0 in | Wt 182.0 lb

## 2022-08-26 DIAGNOSIS — D649 Anemia, unspecified: Secondary | ICD-10-CM

## 2022-08-26 DIAGNOSIS — R195 Other fecal abnormalities: Secondary | ICD-10-CM

## 2022-08-26 DIAGNOSIS — I480 Paroxysmal atrial fibrillation: Secondary | ICD-10-CM

## 2022-08-26 DIAGNOSIS — R1310 Dysphagia, unspecified: Secondary | ICD-10-CM | POA: Diagnosis not present

## 2022-08-26 LAB — BASIC METABOLIC PANEL
BUN: 39 mg/dL — ABNORMAL HIGH (ref 6–23)
CO2: 25 mEq/L (ref 19–32)
Calcium: 10.3 mg/dL (ref 8.4–10.5)
Chloride: 106 mEq/L (ref 96–112)
Creatinine, Ser: 2.16 mg/dL — ABNORMAL HIGH (ref 0.40–1.20)
GFR: 20.95 mL/min — ABNORMAL LOW (ref >60.00)
Glucose, Bld: 162 mg/dL — ABNORMAL HIGH (ref 70–99)
Potassium: 4.8 mEq/L (ref 3.5–5.1)
Sodium: 139 mEq/L (ref 135–145)

## 2022-08-26 LAB — B12 AND FOLATE PANEL
Folate: >23.8 ng/mL (ref >5.9)
Vitamin B-12: >1501 pg/mL — ABNORMAL HIGH (ref 211–911)

## 2022-08-26 LAB — IBC + FERRITIN
Ferritin: 216.7 ng/mL (ref 10.0–291.0)
Iron: 41 ug/dL — ABNORMAL LOW (ref 42–145)
Saturation Ratios: 15 % — ABNORMAL LOW (ref 20.0–50.0)
TIBC: 273 ug/dL (ref 250.0–450.0)
Transferrin: 195 mg/dL — ABNORMAL LOW (ref 212.0–360.0)

## 2022-08-26 LAB — HEMOGLOBIN AND HEMATOCRIT, BLOOD
HCT: 34 % — ABNORMAL LOW (ref 36.0–46.0)
Hemoglobin: 10.6 g/dL — ABNORMAL LOW (ref 12.0–15.0)

## 2022-08-26 NOTE — Patient Instructions (Addendum)
If your blood pressure at your visit was 140/90 or greater, please contact your primary care physician to follow up on this. ______________________________________________________ If you are age 82 or older, your body mass index should be between 23-30. Your Body mass index is 32.24 kg/m. If this is out of the aforementioned range listed, please consider follow up with your Primary Care Provider.  If you are age 23 or younger, your body mass index should be between 19-25. Your Body mass index is 32.24 kg/m. If this is out of the aformentioned range listed, please consider follow up with your Primary Care Provider. __________________________________________________  The [facility] providers would like to encourage you to use [facility] to communicate with providers for non-urgent requests or questions.  Due to long hold times on the telephone, sending your provider a message by [facility] may be a faster and more efficient way to get a response.  Please allow 48 business hours for a response.  Please remember that this is for non-urgent requests.  _______________________________________________________ Your provider has requested that you go to the basement level for lab work before leaving today. Press "B" on the elevator. The lab is located at the first door on the left as you exit the elevator.  Ok to initiate Anticoagulation ie: Eliquis if recommend by your cardiologist with close CBC monitoring   Stay on Pantoprazole 40mg  once daily and Famotidine 40mg  one tab at bed time.  Due to recent changes in healthcare laws, you may see the results of your imaging and laboratory studies on MyChart before your provider has had a chance to review them.  We understand that in some cases there may be results that are confusing or concerning to you. Not all laboratory results come back in the same time frame and the provider may be waiting for multiple results in order to interpret others.  Please give [place] 48 hours  in order for your provider to thoroughly review all the results before contacting the office for clarification of your results.   Thank you for trusting me with your gastrointestinal care!   Alcide Evener, CRNP

## 2022-08-26 NOTE — Progress Notes (Signed)
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 She is known by Dr. Chales Abrahams.   She presents to our office today as recommended by her cardiologist Dr. Bing Matter evaluation regarding a recent positive FOBT in setting of chronic anemia and TEE ECHO showed 2 clots in the right atrium. Anticoagulation not initiated, awaiting GI recommendations. She denies having any nausea or vomiting. No heartburn. She has intermittent dysphagia, food briefly gets stuck to the upper esophagus once or twice weekly x 6 months. Last episode occurred one week ago, she ate potatoes which briefly got stuck in her upper esophagus and passed after she drank a few sips of water. No upper abdominal pain. She remains on Pantoprazole 40 mg daily and Famotidine 40 mg at bedtime. She has mild RLQ discomfort which occurs prior to passing a BM and abates after defecation completed. She is passing a normal dark brown formed stool daily.  Stools are darker since she previously started Ferrous Sulfate daily.  No rectal bleeding. She is no longer on Eliquis. However, she has recurrent A-fib with RVR and was Amiodarone dose was recently increased.  She underwent a TEE  08/12/2018 (prior to consideration for cardioversion) which identified 2 highly mobile thrombus in the right atrium, moderate to severe MR and LVEF 55 to 60%.  She has a significant cardiac history including STEMI with cardiogenic shock s/p DES to the RCA 08/2016.   She also had a GI bleed during this hospitalization with profound anemia.  Admission hemoglobin level 5.9 with positive FOBT on Xarelto for A-fib.  She received 4 units of PRBCs. She underwent  an EGD 08/12/2016 by Dr. Rocky Crafts which showed a small hiatal hernia, mild gastritis. Colonoscopy 08/12/2016 showed moderate pancolonic diverticulosis, small ascending colonic tubular adenoma, ischemic colitis and mod internal hemorrhoids.  Etiology for her GI bleed was not identified.  Hemoglobin was 8.3 at time of discharge.  She underwent a small bowel capsule endoscopy 10/28/2017 which showed a small gastric polyp, poorly visualized lesion in the proximal small bowel with erythema, friability 35 minutes and small erosions at 1 hour and 15 minutes.  She underwent an EGD by Dr. Chales Abrahams 12/03/2017 which showed a small hiatal hernia otherwise was normal.  She has CKD stage III/IV followed by nephrologist at [facility].  She last saw her nephrologist Dr. Lequita Halt on 08/04/2022.  At that time, her creatinine level was 2.1 with a GFR 23.  Hemoglobin downward at 9.2 despite oral iron supplementation.  She received EPO 20,000 units x 1.  She received IV iron infusions in the past, not recently.      Latest Ref Rng & Units 08/18/2022   10:55 AM 07/30/2022    1:23 PM 07/22/2022   11:49 AM  CBC  WBC 3.4 - 10.8 x10E3/uL 4.3  6.2  6.9   Hemoglobin 11.1 - 15.9 g/dL 9.8  9.2  9.1   Hematocrit 34.0 - 46.6 % 32.4  29.4  29.3   Platelets 150 - 450 x10E3/uL 234  326  272  Latest Ref Rng & Units 07/30/2022    1:23 PM 07/22/2022   11:49 AM 06/01/2022    4:18 PM  CMP  Glucose 70 - 99 mg/dL 440  347    BUN 8 - 27 mg/dL 45  38    Creatinine 4.25 - 1.00 mg/dL 9.56  3.87    Sodium 564 - 144 mmol/L 142  145    Potassium 3.5 - 5.2 mmol/L 4.7  4.3    Chloride 96 - 106 mmol/L 105  111    CO2 20 - 29 mmol/L 23  19    Calcium 8.7 - 10.3 mg/dL 33.2  95.1    Total Protein 6.0 - 8.5 g/dL 6.5   6.1   Total Bilirubin 0.0 -  1.2 mg/dL <8.8   0.3   Alkaline Phos 44 - 121 IU/L 88   86   AST 0 - 40 IU/L 15   18   ALT 0 - 32 IU/L 17   13     ECHO 08/12/2022: IMPRESSIONS Left ventricular ejection fraction, by estimation, is 55 to 60%. The left ventricle has normal function. Left ventricular diastolic function could not be evaluated. 1. 2. Right ventricular systolic function is normal. The right ventricular size is normal. Watchman device well seated in the left atrial appendage. No clots. No noted residual flow/leak noted on color doppler. No left atrial/left atrial appendage thrombus was detected. 3. Device lead noted in the right atrium. There are two highly mobile echogenic material off the device lead consistent with a clot/thrombus. 4. The mitral valve is abnormal. Moderate to severe mitral valve regurgitation. No evidence of mitral stenosis. 5. 6. The aortic valve is calcified. Aortic valve regurgitation is not visualized.  EGD 08/12/2016 by Dr. Rocky Crafts done during hospitalization with STEMI and GI bleed: Small hiatal hernia, mild gastritis.    Colonoscopy 08/12/2016 done during hospitalization with STEMI and GI bleed: Moderate pancolonic diverticulosis, small ascending colonic tubular adenoma, ischemic colitis and mod internal hemorrhoids. .  EGD 12/03/2017 by Dr. Chales Abrahams: - Small hiatal hernia.  - Normal examined duodenum.  - No specimens collected.  Small bowel capsule endoscopy 10/28/2017: Complete capsule study, fairly good prep Small gastric polyp Poorly visualized lesion in proximal small bowel with erythema, friability at 35 minutes  Small erosions at 1 hour 50 minutes  11/21/2014 by Dr. Leona Singleton: Moderate predominantly sigmoid diverticulosis Otherwise normal colonoscopy to terminal ileum  Past Medical History:  Diagnosis Date   Acute ischemic stroke (HCC)    Acute metabolic encephalopathy    Acute on chronic systolic (congestive) heart failure (HCC)    Acute renal insufficiency 09/02/2016   Anemia due to  stage 4 chronic kidney disease (HCC) 04/04/2015   Atrial fibrillation (HCC)    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) 04/04/2015   Bradycardia 09/17/2014   Chest pain 06/23/2016   Overview:  Added automatically from request for surgery [id number]   Chronic anemia    Chronic kidney disease (CKD), stage III (moderate) (HCC)    Coronary artery disease 10/12/2016   Non drug-eluting stent implanted in June 2018 to mid RCA   08/08/2016 10:37  Angiographic Findings  Cardiac Arteries and Lesion Findings LMCA: 0% and Normal. LAD: 0% and Normal. RCA:   Lesion on Mid RCA: Mid subsection.95% stenosis reduced to 0%. Pre procedure   TIMI II flow was noted. Post Procedure TIMI III flow was present. Poor run   off was present. The lesion was diagnosed as High Risk (  C).    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   History of cardiac monitoring 02/23/2018   monitor inserted   History of iron deficiency anemia 12/28/2017   Hyperlipidemia    Hypertensive heart disease with heart failure (HCC)    Iron deficiency anemia 12/28/2017   LV dysfunction 09/02/2016   Myocardial infarction [facility])    NSTEMI (non-ST elevated myocardial infarction) (HCC) 08/06/2016   Obstructive sleep apnea 09/17/2014   Orthostatic hypotension 12/28/2017   OSA on CPAP    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   Postural dizziness with presyncope 09/23/2017   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/24/2017   Recurrent left pleural effusion 03/14/2015   Renal failure, chronic, stage 3 (moderate) (HCC)    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 ENDOSCOPY;  Service: Cardiovascular;;   CARDIAC CATHETERIZATION     CARDIOVERSION N/A 05/16/2020   Procedure: CARDIOVERSION;  Surgeon: Parke Poisson, MD;  Location: [facility] ENDOSCOPY;  Service: Cardiovascular;  Laterality: N/A;   CARDIOVERSION N/A 09/06/2020   Procedure: CARDIOVERSION;  Surgeon: Little Ishikawa, MD;  Location: [facility] ENDOSCOPY;  Service: Cardiovascular;  Laterality: N/A;   CARDIOVERSION N/A 05/16/2021   Procedure: CARDIOVERSION;  Surgeon: Quintella Reichert, MD;  Location: [facility] ENDOSCOPY;  Service: Cardiovascular;  Laterality: N/A;   CARDIOVERSION N/A 08/12/2022   Procedure: CARDIOVERSION;  Surgeon: Thomasene Ripple, DO;  Location: [facility] INVASIVE CV LAB;  Service: Cardiovascular;  Laterality: N/A;   COLONOSCOPY  11/21/2014   Moderate predominantly sigmoid diverticulosis. Otherwise noraml collonscopy to TI.    CORONARY ANGIOPLASTY WITH STENT PLACEMENT  08/2016   EXCISIONAL HEMORRHOIDECTOMY     LEFT ATRIAL APPENDAGE OCCLUSION  11/2016   in [place]   LOOP RECORDER INSERTION N/A 02/23/2018   Procedure: LOOP RECORDER INSERTION;  Surgeon: Regan Lemming, MD;  Location: [facility] INVASIVE CV LAB;  Service: Cardiovascular;  Laterality: N/A;   LOOP RECORDER REMOVAL N/A 04/24/2019   Procedure: LOOP RECORDER REMOVAL;  Surgeon: Regan Lemming, MD;  Location: [facility] INVASIVE CV LAB;  Service: Cardiovascular;  Laterality: N/A;   NOSE SURGERY     PACEMAKER IMPLANT N/A 04/24/2019   Procedure: PACEMAKER IMPLANT;  Surgeon: Regan Lemming, MD;  Location: [facility] INVASIVE CV LAB;  Service: Cardiovascular;  Laterality: N/A;   TEE WITHOUT CARDIOVERSION N/A 05/16/2020   Procedure: TRANSESOPHAGEAL ECHOCARDIOGRAM (TEE);  Surgeon: Parke Poisson, MD;  Location: [facility] ENDOSCOPY;  Service: Cardiovascular;  Laterality: N/A;   TEE WITHOUT CARDIOVERSION N/A 08/12/2022    Procedure: TRANSESOPHAGEAL ECHOCARDIOGRAM;  Surgeon: Thomasene Ripple, DO;  Location: [facility] INVASIVE CV LAB;  Service: Cardiovascular;  Laterality: N/A;   TOTAL HIP REVISION Left 03/25/2021   Procedure: LEFT POSTERIOR TOTAL HIP  ZIMMER CABLES;  Surgeon: Durene Romans, MD;  Location: [facility] ORS;  Service: Orthopedics;  Laterality: Left;   Social History: She is married.  She has 2 daughters.  Retired.  Past smoker, quit smoking cigarettes 27 years ago.  She drinks 2 alcoholic beverages  weekly.  No drug use.  Family History: family history includes Breast cancer in her mother; Cancer in her maternal grandfather; Diabetes in her sister; Heart attack in her brother, maternal uncle, and sister; Hypertension in her father; Prostate cancer in her father; Stroke in her father.No known family history of esophageal, gastric or colon cancer   Allergies  Allergen Reactions   Ciprofloxacin Other (See Comments)    Achilles tendon pain   Contrast Media [Iodinated Contrast Media]     Avoid due to stage 4 kidney disease    Crestor [Rosuvastatin]     Joint pain   Lipitor [Atorvastatin]     Joint pain   Nsaids     Avoid due to stage 4 kidney disease    Statins Other (See Comments)    Joint pain, tolerates pravastatin      Outpatient Encounter Medications as of 08/26/2022  Medication Sig   albuterol (VENTOLIN HFA) 108 (90 Base) MCG/ACT inhaler Inhale 2 puffs into the lungs every 6 (six) hours as needed for wheezing or shortness of breath.   amiodarone (PACERONE) 200 MG tablet Take 1 tablet (200 mg total) by mouth 2 (two) times daily.   amLODipine (NORVASC) 5 MG tablet TAKE 1 TABLET BY MOUTH DAILY   BIOTIN PO Take 1,000 mcg by mouth daily.   blood glucose meter kit and supplies KIT Dispense based on patient and insurance preference. Use up to four times daily as directed. (Patient taking differently: Inject 1 each into the skin as directed. Dispense based on patient and insurance preference. Use up to four times  daily as directed.)   carboxymethylcellul-glycerin (REFRESH RELIEVA) 0.5-0.9 % ophthalmic solution Place 1 drop into both eyes daily as needed for dry eyes. VE   Cholecalciferol (VITAMIN D3) 50 MCG (2000 UT) TABS Take 2,000 Units by mouth daily at 12 noon.   Coenzyme Q10 100 MG capsule Take 100 mg by mouth 2 (two) times daily.   colchicine 0.6 MG tablet Take 0.6 mg by mouth daily as needed (gout). For gout flare ups   denosumab (PROLIA) 60 MG/ML SOSY injection Inject 60 mg into the skin every 6 (six) months.   docusate sodium (COLACE) 100 MG capsule Take 100 mg by mouth daily.   estradiol (ESTRACE) 0.1 MG/GM vaginal cream Place 1 Applicatorful vaginally every Monday, Wednesday, and Friday.    Evolocumab (REPATHA SURECLICK) 140 MG/ML SOAJ Inject 140 mg into the skin every 14 (fourteen) days.   famotidine (PEPCID) 40 MG tablet Take 40 mg by mouth every evening.   febuxostat (ULORIC) 40 MG tablet Take 40 mg by mouth at bedtime.   feeding supplement, GLUCERNA SHAKE, (GLUCERNA SHAKE) LIQD Take 237 mLs by mouth 3 (three) times daily between meals. (Patient taking differently: Take 237 mLs by mouth 2 (two) times daily between meals.)   ferrous sulfate 325 (65 FE) MG EC tablet Take 325 mg by mouth 2 (two) times daily with breakfast and lunch.   folic acid (FOLVITE) 800 MCG tablet Take 800 mcg by mouth daily at 12 noon.   Glucosamine-Chondroitin (OSTEO BI-FLEX REGULAR STRENGTH PO) Take 25 mcg by mouth daily at 12 noon. Plus D3   hydrALAZINE (APRESOLINE) 50 MG tablet Take 1 tablet (50 mg total) by mouth every 6 (six) hours.   isosorbide mononitrate (IMDUR) 60 MG 24 hr tablet Take 60 mg by mouth daily.   Lactobacillus (FLORAJEN ACIDOPHILUS) CAPS Take 1 capsule by mouth daily at 12 noon. 390 mg each   levothyroxine (SYNTHROID, LEVOTHROID) 50  MCG tablet Take 50 mcg by mouth daily before breakfast.   Multiple Vitamins-Minerals (PRESERVISION AREDS 2) CAPS Take 1 capsule by mouth 2 (two) times daily.    nitroGLYCERIN (NITROSTAT) 0.4 MG SL tablet Place 0.4 mg under the tongue every 5 (five) minutes as needed for chest pain.   omega-3 acid ethyl esters (LOVAZA) 1 g capsule Take 2 capsules (2 g total) by mouth daily.   pantoprazole (PROTONIX) 40 MG tablet Take 1 tablet (40 mg total) by mouth daily.   polyethylene glycol powder (GLYCOLAX/MIRALAX) powder Take 17 g by mouth every other day. Alternating between miralax and benefiber   pravastatin (PRAVACHOL) 40 MG tablet Take 40 mg by mouth at bedtime.   ranolazine (RANEXA) 500 MG 12 hr tablet Take 1 tablet (500 mg total) by mouth 2 (two) times daily.   torsemide (DEMADEX) 10 MG tablet Take 1 tablet (10 mg total) by mouth 2 (two) times daily.   vitamin B-12 (CYANOCOBALAMIN) 1000 MCG tablet Take 1,000 mcg by mouth daily at 12 noon.   Wheat Dextrin (BENEFIBER DRINK MIX PO) Take 1 Scoop by mouth every other day. Mix 1 capful with beverage and drink every other day   No facility-administered encounter medications on file as of 08/26/2022.    REVIEW OF SYSTEMS:  Gen: Denies fever, sweats or chills. No weight loss.  CV: Denies chest pain, palpitations or edema. Resp: Denies cough, shortness of breath of hemoptysis.  GI:See HPI. GU : Denies urinary burning, blood in urine, increased urinary frequency or incontinence. MS: Denies joint pain, muscles aches or weakness. Derm: Denies rash, itchiness, skin lesions or unhealing ulcers. Psych: Denies depression, anxiety, memory loss or confusion. Heme: Denies bruising, easy bleeding. Neuro:  Denies headaches, dizziness or paresthesias. Endo:  + DM type II.  PHYSICAL EXAM: BP (!) 148/72   Pulse 66   Ht 5\' 3"  (1.6 m)   Wt 182 lb (82.6 kg)   SpO2 99%   BMI 32.24 kg/m   General: 82 year old female in no acute distress. Head: Normocephalic and atraumatic. Eyes:  Sclerae non-icteric, conjunctive pink. Ears: Normal auditory acuity. Mouth: Dentition intact. No ulcers or lesions.  Neck: Supple, no  lymphadenopathy or thyromegaly.  Lungs: Clear bilaterally to auscultation without wheezes, crackles or rhonchi. Heart: Regular rate and rhythm. No murmur, rub or gallop appreciated.  Abdomen: Soft, nontender, nondistended. No masses. No hepatosplenomegaly. Normoactive bowel sounds x 4 quadrants.  Rectal: Deferred.  Musculoskeletal: Symmetrical with no gross deformities. Skin: Warm and dry. No rash or lesions on visible extremities. Extremities: Bilateral LE edema, LLE > RLE with negative Homan's sign. Patient  Neurological: Alert oriented x 4, no focal deficits.  Psychological:  Alert and cooperative. Normal mood and affect.  ASSESSMENT AND PLAN:  82 year old female with chronic iron anemia with + FOBT. Hg 9.8. No obvious rectal bleeding or melena. History of GI 08/2016.  EGD 08/12/2016 showed a small hiatal hernia, mild gastritis. Colonoscopy 08/12/2016 showed moderate pancolonic diverticulosis, small ascending colonic tubular adenoma, ischemic colitis and mod internal hemorrhoids. Small bowel capsule endoscopy 10/28/2017 showed a small gastric polyp, poorly visualized lesion in the proximal small bowel with erythema, friability 35 minutes and small erosions at 1 hour and 15 minutes. She EGD 12/03/2017 which showed a small hiatal hernia otherwise was normal.  -Patient is very high risk for sedation/procedure complications. Endoscopic evaluation deferred for now.  -CTAP with oral contrast only asap -Await repeat H/H results  -Case reviewed by Dr. Chales Abrahams, ok for cardiology  to start anticoagulation for right atrial thrombus  -If patient develops active GI bleeding, patient to present to [facility] ED for admission  CKD stage III/IV contributing to anemia. Received EPO x 1 per nephrology.   History of GERD. Dysphagia x 6 months. -Barium swallow study, to be scheduled one week after CTAP completed  -Endoscopic evaluation deferred for now   Atrial fibrillation, not on anticoagulation.    TEE showed 2 clots in the right atrium.  -Patient to follow up with cardiology -See GI recommendations and noted above   Bilateral LE edema, LLE > RLE. Patient endorsed falling/injuring her left knee 3 months ago, since then LLE edema > RLE -Follow up with cardiologist, recommend LLE doppler to rule out DVT  History of GI a tubular adenomatous polyp removed from the colon per colonoscopy 08/2016  History of ischemic colitis per colonoscopy 08/2016    CC:  Street, [place], *

## 2022-08-27 ENCOUNTER — Telehealth: Payer: Self-pay

## 2022-08-27 DIAGNOSIS — M7989 Other specified soft tissue disorders: Secondary | ICD-10-CM

## 2022-08-27 DIAGNOSIS — I480 Paroxysmal atrial fibrillation: Secondary | ICD-10-CM

## 2022-08-27 MED ORDER — APIXABAN 2.5 MG PO TABS: 2.5000 mg | ORAL_TABLET | Freq: Two times a day (BID) | ORAL | 6 refills | Status: DC

## 2022-08-27 NOTE — Progress Notes (Signed)
Agree with assessment/plan. Endoscopic evaluation only if active bleeding or slow bleed which requires transfusions From cardiac standpoint, resume anticoagulation  Edman Circle, MD [facility] [phone]

## 2022-08-27 NOTE — Telephone Encounter (Signed)
Per Dr. Lucrezia Europe note. Pt to start Eliquis 2.5mg  Bid, H& H next week, DVT study ordered.

## 2022-08-27 NOTE — Addendum Note (Signed)
Addended by: Baldo Ash D on: 08/27/2022 11:01 AM   Modules accepted: Orders

## 2022-08-27 NOTE — Telephone Encounter (Signed)
Spoke with pt. Sent Eliquis to [facility] per Pt request. Pt agreed to come for H& H and will await call to schedule DVT study. She had no further questions.

## 2022-08-28 DIAGNOSIS — N3090 Cystitis, unspecified without hematuria: Secondary | ICD-10-CM | POA: Diagnosis not present

## 2022-08-28 DIAGNOSIS — N3001 Acute cystitis with hematuria: Secondary | ICD-10-CM | POA: Diagnosis not present

## 2022-08-31 ENCOUNTER — Ambulatory Visit (HOSPITAL_COMMUNITY): Admission: RE | Admit: 2022-08-31 | Payer: Medicare Other | Source: Ambulatory Visit

## 2022-09-02 ENCOUNTER — Ambulatory Visit (HOSPITAL_COMMUNITY)
Admission: RE | Admit: 2022-09-02 | Discharge: 2022-09-02 | Disposition: A | Discharge status: Home / Self Care | Payer: Medicare Other | Source: Ambulatory Visit | Attending: Nurse Practitioner | Admitting: Nurse Practitioner

## 2022-09-02 DIAGNOSIS — I480 Paroxysmal atrial fibrillation: Secondary | ICD-10-CM | POA: Diagnosis not present

## 2022-09-02 DIAGNOSIS — I2510 Atherosclerotic heart disease of native coronary artery without angina pectoris: Secondary | ICD-10-CM | POA: Diagnosis not present

## 2022-09-02 DIAGNOSIS — J90 Pleural effusion, not elsewhere classified: Secondary | ICD-10-CM | POA: Insufficient documentation

## 2022-09-02 DIAGNOSIS — R195 Other fecal abnormalities: Secondary | ICD-10-CM | POA: Diagnosis not present

## 2022-09-02 DIAGNOSIS — K449 Diaphragmatic hernia without obstruction or gangrene: Secondary | ICD-10-CM | POA: Insufficient documentation

## 2022-09-02 DIAGNOSIS — D649 Anemia, unspecified: Secondary | ICD-10-CM | POA: Insufficient documentation

## 2022-09-02 DIAGNOSIS — N289 Disorder of kidney and ureter, unspecified: Secondary | ICD-10-CM | POA: Diagnosis not present

## 2022-09-02 DIAGNOSIS — R1310 Dysphagia, unspecified: Secondary | ICD-10-CM | POA: Insufficient documentation

## 2022-09-02 DIAGNOSIS — K224 Dyskinesia of esophagus: Secondary | ICD-10-CM | POA: Insufficient documentation

## 2022-09-02 DIAGNOSIS — I700 Atherosclerosis of aorta: Secondary | ICD-10-CM | POA: Insufficient documentation

## 2022-09-02 DIAGNOSIS — K5730 Diverticulosis of large intestine without perforation or abscess without bleeding: Secondary | ICD-10-CM | POA: Diagnosis not present

## 2022-09-02 DIAGNOSIS — K219 Gastro-esophageal reflux disease without esophagitis: Secondary | ICD-10-CM | POA: Diagnosis not present

## 2022-09-15 ENCOUNTER — Ambulatory Visit: Payer: Medicare Other | Attending: Cardiology

## 2022-09-15 DIAGNOSIS — M79669 Pain in unspecified lower leg: Secondary | ICD-10-CM | POA: Diagnosis not present

## 2022-09-15 DIAGNOSIS — M7989 Other specified soft tissue disorders: Secondary | ICD-10-CM | POA: Diagnosis not present

## 2022-09-17 ENCOUNTER — Other Ambulatory Visit: Payer: Self-pay

## 2022-09-17 ENCOUNTER — Other Ambulatory Visit (HOSPITAL_COMMUNITY): Payer: Self-pay | Admitting: *Deleted

## 2022-09-17 ENCOUNTER — Telehealth: Payer: Self-pay

## 2022-09-17 DIAGNOSIS — R1310 Dysphagia, unspecified: Secondary | ICD-10-CM

## 2022-09-17 DIAGNOSIS — K224 Dyskinesia of esophagus: Secondary | ICD-10-CM

## 2022-09-17 NOTE — Progress Notes (Signed)
Cardiology Office Note:  .   Date:  09/18/2022  ID:  [name], DOB 1940-08-14, MRN [id number] PCP: Street, Stephanie Coup, MD  [facility] Providers Cardiologist:  Gypsy Balsam, MD Electrophysiologist:  Will Jorja Loa, MD    History of Present Illness: .   Amanda [name] is a 82 y.o. female with a history of atrial fibrillation presence of Watchman device 2018, history of frequent GI bleeds not on anticoagulation, CKD stage IV, CAD s/p PTCA DES mid RCA 2018, history of CVA, complete heart block/PPM, systolic heart failure, severe MR, OSA on CPAP, DM2, anemia due to CKD, dyslipidemia.  09/15/2022 lower extremity DVT ultrasound-chronic DVT noted 08/12/2022 TEE revealed right atrial lead with notable mass suspected to be thrombus/clot 05/01/2022 echo EF 70 to 75%, moderate LVH, grade 2 DD, LA mildly dilated, aortic valve calcified, mild calcification and mild aortic valve stenosis  Evaluated by Dr. Bing Matter on 07/22/2022, she came in with complaints of feeling short of breath and tachycardia.  She had been experiencing URI symptoms for the last few days and noticed her heart was beating and having palpitations.  She was noted to be in paroxysmal atrial flutter tachycardia, her amiodarone was increased to 200 mg twice daily.  Lab work revealed creatinine 1.95, sodium 145, potassium 4.3, stable H&H, magnesium 2.1.  Evaluated on 07/30/22 feeling poorly, new pedal edema. She followed back up with Dr. Bing Matter a week later, TEE/DCCV arranged.  TEE revealed right atrial lead with notable mass suspected to be thrombus/clot. Gu[place] was positive for stool so she could not be anticoagulated--which was going to be the plan to anticoagulate and then attempt DCCV again.  It appears she was evaluated by GI and cleared from their perspective, she was then started on Eliquis 2.5 mg twice daily on 08/27/2022.  She presents today for follow-up of her atrial fibrillation, was restarted on  Eliquis--denies hematochezia, hematuria, hemoptysis.  She continues to feel poorly in atrial fibrillation, is eager to undergo DCCV. She denies chest pain, palpitations, dyspnea, pnd, orthopnea, n, v, dizziness, syncope, weight gain, or early satiety.   ROS: Review of Systems  Constitutional: Negative.   HENT: Negative.    Eyes: Negative.   Respiratory: Negative.    Cardiovascular:  Positive for leg swelling.  Gastrointestinal: Negative.   Genitourinary: Negative.   Musculoskeletal: Negative.   Skin: Negative.   Neurological: Negative.   Endo/Heme/Allergies:  Bruises/bleeds easily.  Psychiatric/Behavioral: Negative.       Studies Reviewed: .        Cardiac Studies & Procedures       ECHOCARDIOGRAM  ECHOCARDIOGRAM COMPLETE 05/01/2022  Narrative ECHOCARDIOGRAM REPORT    Patient Name:   Amanda [name] Date of Exam: 05/01/2022 Medical Rec #:  [id number]       Height:       62.0 in Accession #:    [id number]      Weight:       178.2 lb Date of Birth:  06-21-1940      BSA:          1.820 m Patient Age:    81 years        BP:           126/70 mmHg Patient Gender: F               HR:           63 bpm. Exam Location:  [place]  Procedure: 2D Echo, Cardiac Doppler, Color Doppler and Strain  Analysis  Indications:    Paroxysmal atrial fibrillation (HCC) [I48.0 (ICD-10-CM)]; Chronic systolic CHF (congestive heart failure) (HCC) [I50.22 (ICD-10-CM)]; Coronary artery disease involving native coronary artery of native heart without angina pectoris [I25.10 (ICD-10-CM)]  History:        Patient has prior history of Echocardiogram examinations, most recent 12/23/2020. Previous Myocardial Infarction, Pacemaker, Mitral Valve Disease, Arrythmias:Atrial Fibrillation; Risk Factors:Hypertension.  Sonographer:    Margreta Journey RDCS Referring Phys: [id number] ROBERT J KRASOWSKI  IMPRESSIONS   1. Left ventricular ejection fraction, by estimation, is 70 to 75%. The left ventricle has  hyperdynamic function. The left ventricle has no regional wall motion abnormalities. There is moderate left ventricular hypertrophy. Left ventricular diastolic parameters are consistent with Grade II diastolic dysfunction (pseudonormalization). The average left ventricular global longitudinal strain is 16.4 %. The global longitudinal strain is abnormal. 2. Right ventricular systolic function is normal. The right ventricular size is normal. There is normal pulmonary artery systolic pressure. 3. Left atrial size was mildly dilated. 4. The mitral valve is normal in structure. Moderate mitral valve regurgitation. No evidence of mitral stenosis. Moderate mitral annular calcification. 5. The aortic valve is calcified. There is mild calcification of the aortic valve. There is mild thickening of the aortic valve. Aortic valve regurgitation is not visualized. Mild aortic valve stenosis. Aortic valve mean gradient measures 8.4 mmHg. 6. The inferior vena cava is normal in size with greater than 50% respiratory variability, suggesting right atrial pressure of 3 mmHg.  FINDINGS Left Ventricle: Left ventricular ejection fraction, by estimation, is 70 to 75%. The left ventricle has hyperdynamic function. The left ventricle has no regional wall motion abnormalities. The average left ventricular global longitudinal strain is 16.4 %. The global longitudinal strain is abnormal. The left ventricular internal cavity size was normal in size. There is moderate left ventricular hypertrophy. Left ventricular diastolic parameters are consistent with Grade II diastolic dysfunction (pseudonormalization).  Right Ventricle: The right ventricular size is normal. No increase in right ventricular wall thickness. Right ventricular systolic function is normal. There is normal pulmonary artery systolic pressure. The tricuspid regurgitant velocity is 2.78 m/s, and with an assumed right atrial pressure of 3 mmHg, the estimated right  ventricular systolic pressure is 33.9 mmHg.  Left Atrium: Left atrial size was mildly dilated.  Right Atrium: Right atrial size was normal in size.  Pericardium: There is no evidence of pericardial effusion.  Mitral Valve: The mitral valve is normal in structure. Moderate mitral annular calcification. Moderate mitral valve regurgitation. No evidence of mitral valve stenosis. MV peak gradient, 6.7 mmHg. The mean mitral valve gradient is 2.0 mmHg.  Tricuspid Valve: The tricuspid valve is normal in structure. Tricuspid valve regurgitation is mild . No evidence of tricuspid stenosis.  Aortic Valve: The aortic valve is calcified. There is mild calcification of the aortic valve. There is mild thickening of the aortic valve. Aortic valve regurgitation is not visualized. Mild aortic stenosis is present. Aortic valve mean gradient measures 8.4 mmHg. Aortic valve peak gradient measures 14.9 mmHg. Aortic valve area, by VTI measures 1.42 cm.  Pulmonic Valve: The pulmonic valve was normal in structure. Pulmonic valve regurgitation is not visualized. No evidence of pulmonic stenosis.  Aorta: The aortic root is normal in size and structure.  Venous: The inferior vena cava is normal in size with greater than 50% respiratory variability, suggesting right atrial pressure of 3 mmHg.  IAS/Shunts: No atrial level shunt detected by color flow Doppler.  Additional Comments: A device lead is visualized  in the right atrium and right ventricle.   LEFT VENTRICLE PLAX 2D LVIDd:         3.50 cm   Diastology LVIDs:         1.70 cm   LV e' medial:    5.73 cm/s LV PW:         1.70 cm   LV E/e' medial:  18.2 LV IVS:        1.80 cm   LV e' lateral:   7.58 cm/s LVOT diam:     1.90 cm   LV E/e' lateral: 13.8 LV SV:         64 LV SV Index:   35        2D Longitudinal Strain LVOT Area:     2.84 cm  2D Strain GLS Avg:     16.4 %   RIGHT VENTRICLE             IVC RV Basal diam:  3.00 cm     IVC diam: 1.20 cm RV  Mid diam:    2.30 cm RV S prime:     12.53 cm/s TAPSE (M-mode): 2.0 cm  LEFT ATRIUM             Index        RIGHT ATRIUM           Index LA diam:        4.00 cm 2.20 cm/m   RA Area:     11.90 cm LA Vol (A2C):   65.7 ml 36.10 ml/m  RA Volume:   24.50 ml  13.46 ml/m LA Vol (A4C):   50.0 ml 27.47 ml/m LA Biplane Vol: 60.2 ml 33.07 ml/m AORTIC VALVE AV Area (Vmax):    1.42 cm AV Area (Vmean):   1.34 cm AV Area (VTI):     1.42 cm AV Vmax:           193.20 cm/s AV Vmean:          132.000 cm/s AV VTI:            0.451 m AV Peak Grad:      14.9 mmHg AV Mean Grad:      8.4 mmHg LVOT Vmax:         96.93 cm/s LVOT Vmean:        62.600 cm/s LVOT VTI:          0.226 m LVOT/AV VTI ratio: 0.50  AORTA Ao Root diam: 2.90 cm Ao Asc diam:  3.50 cm Ao Desc diam: 1.80 cm  MITRAL VALVE                  TRICUSPID VALVE MV Area (PHT): 3.02 cm       TR Peak grad:   30.9 mmHg MV Area VTI:   1.52 cm       TR Vmax:        278.00 cm/s MV Peak grad:  6.7 mmHg MV Mean grad:  2.0 mmHg       SHUNTS MV Vmax:       1.29 m/s       Systemic VTI:  0.23 m MV Vmean:      74.1 cm/s      Systemic Diam: 1.90 cm MV Decel Time: 251 msec MR Peak grad:    161.8 mmHg MR Mean grad:    102.0 mmHg MR Vmax:         636.00 cm/s MR Vmean:  475.5 cm/s MR PISA:         6.28 cm MR PISA Eff ROA: 38 mm MR PISA Radius:  1.00 cm MV E velocity: 104.50 cm/s MV A velocity: 75.45 cm/s MV E/A ratio:  1.39  Gypsy Balsam MD Electronically signed by Gypsy Balsam MD Signature Date/Time: 05/01/2022/12:13:11 PM    Final   TEE  ECHO TEE 08/12/2022  Narrative TRANSESOPHOGEAL ECHO REPORT    Patient Name:   KANYIAH STEFANEK Date of Exam: 08/12/2022 Medical Rec #:  [id number]       Height:       63.0 in Accession #:    [id number]      Weight:       184.4 lb Date of Birth:  01/25/1941      BSA:          1.868 m Patient Age:    81 years        BP:           152/97 mmHg Patient Gender: F                HR:           85 bpm. Exam Location:  Inpatient  Procedure: Transesophageal Echo, Cardiac Doppler and Color Doppler  MODIFIED REPORT: This report was modified by Thomasene Ripple DO on 08/12/2022 due to add decending aorta information. Indications:     I48.91  History:         Patient has prior history of Echocardiogram examinations. CHF, Arrythmias:Atrial Fibrillation; Risk Factors:Diabetes and Hypertension.  Sonographer:     Dondra Prader RVT Referring Phys:  [id number] KARDIE TOBB Diagnosing Phys: Lavona Mound Tobb DO  PROCEDURE: After discussion of the risks and benefits of a TEE, an informed consent was obtained from the patient. The transesophogeal probe was passed without difficulty through the esophogus of the patient. Local oropharyngeal anesthetic was provided with viscous lidocaine. Sedation performed by different physician. The patient developed no complications during the procedure.  IMPRESSIONS   1. Left ventricular ejection fraction, by estimation, is 55 to 60%. The left ventricle has normal function. Left ventricular diastolic function could not be evaluated. 2. Right ventricular systolic function is normal. The right ventricular size is normal. 3. Watchman device well seated in the left atrial appendage. No clots. No noted residual flow/leak noted on color doppler. No left atrial/left atrial appendage thrombus was detected. 4. Device lead noted in the right atrium. There are two highly mobile echogenic material off the device lead consistent with a clot/thrombus. 5. The mitral valve is abnormal. Moderate to severe mitral valve regurgitation. No evidence of mitral stenosis. 6. The aortic valve is calcified. Aortic valve regurgitation is not visualized.  FINDINGS Left Ventricle: Left ventricular ejection fraction, by estimation, is 55 to 60%. The left ventricle has normal function. The left ventricular internal cavity size was normal in size. Left ventricular diastolic function could  not be evaluated.  Right Ventricle: The right ventricular size is normal. No increase in right ventricular wall thickness. Right ventricular systolic function is normal.  Left Atrium: Watchman device well seated in the left atrial appendage. No clots. No noted residual flow/leak noted on color doppler. Left atrial size was normal in size. No left atrial/left atrial appendage thrombus was detected.  Right Atrium: Device lead noted in the right atrium. There are two highly mobile echogenic material off the device lead consistent with a clot/thrombus. Right atrial size was normal in size.  Pericardium: There is no evidence of  pericardial effusion.  Mitral Valve: The mitral valve is abnormal. Moderate to severe mitral valve regurgitation. No evidence of mitral valve stenosis.  Tricuspid Valve: The tricuspid valve is normal in structure. Tricuspid valve regurgitation is trivial. No evidence of tricuspid stenosis.  Aortic Valve: The aortic valve is calcified. Aortic valve regurgitation is not visualized.  Pulmonic Valve: The pulmonic valve was not well visualized. Pulmonic valve regurgitation is not visualized. No evidence of pulmonic stenosis.  Aorta: The aortic root and ascending aorta are structurally normal, with no evidence of dilitation. There is minimal (Grade I) layered plaque involving the descending aorta.  Venous: The right upper pulmonary vein, right lower pulmonary vein, left upper pulmonary vein and left lower pulmonary vein are normal. A pattern of systolic flow reversal, suggestive of severe mitral regurgitation is recorded from the left upper pulmonary vein.  IAS/Shunts: No atrial level shunt detected by color flow Doppler.    AORTA Ao Root diam: 2.90 cm Ao Asc diam:  3.00 cm  MR Peak grad:    117.1 mmHg MR Mean grad:    73.0 mmHg MR Vmax:         541.00 cm/s MR Vmean:        391.0 cm/s MR PISA:         2.26 cm MR PISA Eff ROA: 16 mm MR PISA Radius:  0.60  cm  Kardie Tobb DO Electronically signed by Thomasene Ripple DO Signature Date/Time: 08/12/2022/1:36:26 PM    Final (Updated)   MONITORS  CARDIAC EVENT MONITOR 09/01/2017  Narrative Aleda Grana, DOB [birth year], MRN [id number]  EVENT MONITOR REPORT:   Patient was monitored from 09/01/2017 to 09/30/2017. Indication:                    Paroxysmal atrial fibrillation Ordering physician:  Georgeanna Lea, MD Referring physician:  Georgeanna Lea MD  Patient wear event recorder for 26 days.  The eighth trigger events.   Baseline rhythm: Sinus rhythm   Atrial arrhythmia:.  Of APCs felt as fluttering or palpitations.  No clear-cut sustained arrhythmia.  No clear-cut atrial fibrillation identified  Ventricular arrhythmia: None  Conduction abnormality: None  Symptoms: A trigger events some because of palpitations patient got some APCs during those episodes.   Conclusion: APCs felt as palpitations. No recurrence of atrial fibrillation noted.  Interpreting  cardiologist: Gypsy Balsam, MD Date: 10/11/2017 10:12 AM           Risk Assessment/Calculations:    CHA2DS2-VASc Score = 9   This indicates a 12.2% annual risk of stroke. The patient's score is based upon: CHF History: 1 HTN History: 1 Diabetes History: 1 Stroke History: 2 Vascular Disease History: 1 Age Score: 2 Gender Score: 1            Physical Exam:   VS:  BP 120/70 (BP Location: Right Arm, Patient Position: Sitting, Cuff Size: Normal)   Pulse 80   Ht 5\' 3"  (1.6 m)   Wt 180 lb (81.6 kg)   SpO2 98%   BMI 31.89 kg/m    Wt Readings from Last 3 Encounters:  09/18/22 180 lb (81.6 kg)  08/26/22 182 lb (82.6 kg)  08/07/22 184 lb 6.4 oz (83.6 kg)    GEN: Well nourished, well developed in no acute distress NECK: No JVD; No carotid bruits CARDIAC: irregularly irregular, no murmurs, rubs, gallops RESPIRATORY:  Clear to auscultation without rales, wheezing or rhonchi  ABDOMEN: Soft, non-tender,  non-distended EXTREMITIES: +1 pitting edema; No deformity  ASSESSMENT AND PLAN: .   Paroxysmal atrial flutter/hypercoagulable state/presence of Watchman device - s/p DCCV x 3; unable to tolerate anticoagulation secondary to GI bleed; presence of Watchman device 2018. Presented for TEE/DCCV on 08/12/22, during TEE found intracardiac thrombus.  Eventually cleared to start low dose Eliquis by GI. Today, she is in AF rate controlled. Continue amiodarone 200 mg twice daily.  Continue Eliquis 2.5 mg twice daily. Will repeat CBC for worsening anemia, BMET. Will arrange for TEE/DCCV--we discussed potential complications and indications, she is well versed as she has undergone several cardioversions, she has no questions and this was discussed to her satisfaction.  CAD - PTCA DES mid RCA 2018; Stable with no anginal symptoms. No indication for ischemic evaluation.  Is not on aspirin secondary to GI bleed.  Previously was on metoprolol however this was discontinued in 2023 secondary to a syncopal episode.  Continue Imdur 30 mg daily, continue nitroglycerin as needed--has not needed, continue Ranexa 500 mg twice daily.  Continue Repatha continue Pravachol.   Hypertension-blood pressure is controlled at 120/70, continue Norvasc 5 mg daily, continue hydralazine 50 mg every 6 hours.  Pedal edema-continue Demadex 10 mg BID. Repeat BMET today.   CKD stage IV - follows with nephrology, saw a few weeks ago, stable per her report.        Dispo: TEE/DCCV, repeat CBC and BMET today. Return ~ 10/14/22 with Dr. Bing Matter.   Informed Consent   Shared Decision Making/Informed Consent The risks [stroke, cardiac arrhythmias rarely resulting in the need for a temporary or permanent pacemaker, skin irritation or burns, esophageal damage, perforation (1:10,000 risk), bleeding, pharyngeal hematoma as well as other potential complications associated with conscious sedation including aspiration, arrhythmia, respiratory failure and  death], benefits (treatment guidance, restoration of normal sinus rhythm, diagnostic support) and alternatives of a transesophageal echocardiogram guided cardioversion were discussed in detail with Ms. Rosalio Macadamia and she is willing to proceed.     Signed, Flossie Dibble, NP

## 2022-09-17 NOTE — Telephone Encounter (Signed)
Left message on My Chart with [place] results per Dr. Vanetta Shawl note. Routed to PCP.

## 2022-09-17 NOTE — H&P (View-Only) (Signed)
 Cardiology Office Note:  .   Date:  09/18/2022  ID:  Amanda [name], DOB 1940-08-14, MRN [id number] PCP: Street, Stephanie Coup, MD  [facility] Providers Cardiologist:  Gypsy Balsam, MD Electrophysiologist:  Will Jorja Loa, MD    History of Present Illness: .   Amanda [name] is a 82 y.o. female with a history of atrial fibrillation presence of Watchman device 2018, history of frequent GI bleeds not on anticoagulation, CKD stage IV, CAD s/p PTCA DES mid RCA 2018, history of CVA, complete heart block/PPM, systolic heart failure, severe MR, OSA on CPAP, DM2, anemia due to CKD, dyslipidemia.  09/15/2022 lower extremity DVT ultrasound-chronic DVT noted 08/12/2022 TEE revealed right atrial lead with notable mass suspected to be thrombus/clot 05/01/2022 echo EF 70 to 75%, moderate LVH, grade 2 DD, LA mildly dilated, aortic valve calcified, mild calcification and mild aortic valve stenosis  Evaluated by Dr. Bing Matter on 07/22/2022, she came in with complaints of feeling short of breath and tachycardia.  She had been experiencing URI symptoms for the last few days and noticed her heart was beating and having palpitations.  She was noted to be in paroxysmal atrial flutter tachycardia, her amiodarone was increased to 200 mg twice daily.  Lab work revealed creatinine 1.95, sodium 145, potassium 4.3, stable H&H, magnesium 2.1.  Evaluated on 07/30/22 feeling poorly, new pedal edema. She followed back up with Dr. Bing Matter a week later, TEE/DCCV arranged.  TEE revealed right atrial lead with notable mass suspected to be thrombus/clot. Guaic was positive for stool so she could not be anticoagulated--which was going to be the plan to anticoagulate and then attempt DCCV again.  It appears she was evaluated by GI and cleared from their perspective, she was then started on Eliquis 2.5 mg twice daily on 08/27/2022.  She presents today for follow-up of her atrial fibrillation, was restarted on  Eliquis--denies hematochezia, hematuria, hemoptysis.  She continues to feel poorly in atrial fibrillation, is eager to undergo DCCV. She denies chest pain, palpitations, dyspnea, pnd, orthopnea, n, v, dizziness, syncope, weight gain, or early satiety.   ROS: Review of Systems  Constitutional: Negative.   HENT: Negative.    Eyes: Negative.   Respiratory: Negative.    Cardiovascular:  Positive for leg swelling.  Gastrointestinal: Negative.   Genitourinary: Negative.   Musculoskeletal: Negative.   Skin: Negative.   Neurological: Negative.   Endo/Heme/Allergies:  Bruises/bleeds easily.  Psychiatric/Behavioral: Negative.       Studies Reviewed: .        Cardiac Studies & Procedures       ECHOCARDIOGRAM  ECHOCARDIOGRAM COMPLETE 05/01/2022  Narrative ECHOCARDIOGRAM REPORT    Patient Name:   Amanda [name] Date of Exam: 05/01/2022 Medical Rec #:  [id number]       Height:       62.0 in Accession #:    [id number]      Weight:       178.2 lb Date of Birth:  06-21-1940      BSA:          1.820 m Patient Age:    81 years        BP:           126/70 mmHg Patient Gender: F               HR:           63 bpm. Exam Location:  [place]  Procedure: 2D Echo, Cardiac Doppler, Color Doppler and Strain  Analysis  Indications:    Paroxysmal atrial fibrillation (HCC) [I48.0 (ICD-10-CM)]; Chronic systolic CHF (congestive heart failure) (HCC) [I50.22 (ICD-10-CM)]; Coronary artery disease involving native coronary artery of native heart without angina pectoris [I25.10 (ICD-10-CM)]  History:        Patient has prior history of Echocardiogram examinations, most recent 12/23/2020. Previous Myocardial Infarction, Pacemaker, Mitral Valve Disease, Arrythmias:Atrial Fibrillation; Risk Factors:Hypertension.  Sonographer:    Margreta Journey RDCS Referring Phys: [id number] ROBERT J KRASOWSKI  IMPRESSIONS   1. Left ventricular ejection fraction, by estimation, is 70 to 75%. The left ventricle has  hyperdynamic function. The left ventricle has no regional wall motion abnormalities. There is moderate left ventricular hypertrophy. Left ventricular diastolic parameters are consistent with Grade II diastolic dysfunction (pseudonormalization). The average left ventricular global longitudinal strain is 16.4 %. The global longitudinal strain is abnormal. 2. Right ventricular systolic function is normal. The right ventricular size is normal. There is normal pulmonary artery systolic pressure. 3. Left atrial size was mildly dilated. 4. The mitral valve is normal in structure. Moderate mitral valve regurgitation. No evidence of mitral stenosis. Moderate mitral annular calcification. 5. The aortic valve is calcified. There is mild calcification of the aortic valve. There is mild thickening of the aortic valve. Aortic valve regurgitation is not visualized. Mild aortic valve stenosis. Aortic valve mean gradient measures 8.4 mmHg. 6. The inferior vena cava is normal in size with greater than 50% respiratory variability, suggesting right atrial pressure of 3 mmHg.  FINDINGS Left Ventricle: Left ventricular ejection fraction, by estimation, is 70 to 75%. The left ventricle has hyperdynamic function. The left ventricle has no regional wall motion abnormalities. The average left ventricular global longitudinal strain is 16.4 %. The global longitudinal strain is abnormal. The left ventricular internal cavity size was normal in size. There is moderate left ventricular hypertrophy. Left ventricular diastolic parameters are consistent with Grade II diastolic dysfunction (pseudonormalization).  Right Ventricle: The right ventricular size is normal. No increase in right ventricular wall thickness. Right ventricular systolic function is normal. There is normal pulmonary artery systolic pressure. The tricuspid regurgitant velocity is 2.78 m/s, and with an assumed right atrial pressure of 3 mmHg, the estimated right  ventricular systolic pressure is 33.9 mmHg.  Left Atrium: Left atrial size was mildly dilated.  Right Atrium: Right atrial size was normal in size.  Pericardium: There is no evidence of pericardial effusion.  Mitral Valve: The mitral valve is normal in structure. Moderate mitral annular calcification. Moderate mitral valve regurgitation. No evidence of mitral valve stenosis. MV peak gradient, 6.7 mmHg. The mean mitral valve gradient is 2.0 mmHg.  Tricuspid Valve: The tricuspid valve is normal in structure. Tricuspid valve regurgitation is mild . No evidence of tricuspid stenosis.  Aortic Valve: The aortic valve is calcified. There is mild calcification of the aortic valve. There is mild thickening of the aortic valve. Aortic valve regurgitation is not visualized. Mild aortic stenosis is present. Aortic valve mean gradient measures 8.4 mmHg. Aortic valve peak gradient measures 14.9 mmHg. Aortic valve area, by VTI measures 1.42 cm.  Pulmonic Valve: The pulmonic valve was normal in structure. Pulmonic valve regurgitation is not visualized. No evidence of pulmonic stenosis.  Aorta: The aortic root is normal in size and structure.  Venous: The inferior vena cava is normal in size with greater than 50% respiratory variability, suggesting right atrial pressure of 3 mmHg.  IAS/Shunts: No atrial level shunt detected by color flow Doppler.  Additional Comments: A device lead is visualized  in the right atrium and right ventricle.   LEFT VENTRICLE PLAX 2D LVIDd:         3.50 cm   Diastology LVIDs:         1.70 cm   LV e' medial:    5.73 cm/s LV PW:         1.70 cm   LV E/e' medial:  18.2 LV IVS:        1.80 cm   LV e' lateral:   7.58 cm/s LVOT diam:     1.90 cm   LV E/e' lateral: 13.8 LV SV:         64 LV SV Index:   35        2D Longitudinal Strain LVOT Area:     2.84 cm  2D Strain GLS Avg:     16.4 %   RIGHT VENTRICLE             IVC RV Basal diam:  3.00 cm     IVC diam: 1.20 cm RV  Mid diam:    2.30 cm RV S prime:     12.53 cm/s TAPSE (M-mode): 2.0 cm  LEFT ATRIUM             Index        RIGHT ATRIUM           Index LA diam:        4.00 cm 2.20 cm/m   RA Area:     11.90 cm LA Vol (A2C):   65.7 ml 36.10 ml/m  RA Volume:   24.50 ml  13.46 ml/m LA Vol (A4C):   50.0 ml 27.47 ml/m LA Biplane Vol: 60.2 ml 33.07 ml/m AORTIC VALVE AV Area (Vmax):    1.42 cm AV Area (Vmean):   1.34 cm AV Area (VTI):     1.42 cm AV Vmax:           193.20 cm/s AV Vmean:          132.000 cm/s AV VTI:            0.451 m AV Peak Grad:      14.9 mmHg AV Mean Grad:      8.4 mmHg LVOT Vmax:         96.93 cm/s LVOT Vmean:        62.600 cm/s LVOT VTI:          0.226 m LVOT/AV VTI ratio: 0.50  AORTA Ao Root diam: 2.90 cm Ao Asc diam:  3.50 cm Ao Desc diam: 1.80 cm  MITRAL VALVE                  TRICUSPID VALVE MV Area (PHT): 3.02 cm       TR Peak grad:   30.9 mmHg MV Area VTI:   1.52 cm       TR Vmax:        278.00 cm/s MV Peak grad:  6.7 mmHg MV Mean grad:  2.0 mmHg       SHUNTS MV Vmax:       1.29 m/s       Systemic VTI:  0.23 m MV Vmean:      74.1 cm/s      Systemic Diam: 1.90 cm MV Decel Time: 251 msec MR Peak grad:    161.8 mmHg MR Mean grad:    102.0 mmHg MR Vmax:         636.00 cm/s MR Vmean:  475.5 cm/s MR PISA:         6.28 cm MR PISA Eff ROA: 38 mm MR PISA Radius:  1.00 cm MV E velocity: 104.50 cm/s MV A velocity: 75.45 cm/s MV E/A ratio:  1.39  Gypsy Balsam MD Electronically signed by Gypsy Balsam MD Signature Date/Time: 05/01/2022/12:13:11 PM    Final   TEE  ECHO TEE 08/12/2022  Narrative TRANSESOPHOGEAL ECHO REPORT    Patient Name:   Amanda [name] Date of Exam: 08/12/2022 Medical Rec #:  [id number]       Height:       63.0 in Accession #:    [id number]      Weight:       184.4 lb Date of Birth:  01/25/1941      BSA:          1.868 m Patient Age:    81 years        BP:           152/97 mmHg Patient Gender: F                HR:           85 bpm. Exam Location:  Inpatient  Procedure: Transesophageal Echo, Cardiac Doppler and Color Doppler  MODIFIED REPORT: This report was modified by Thomasene Ripple DO on 08/12/2022 due to add decending aorta information. Indications:     I48.91  History:         Patient has prior history of Echocardiogram examinations. CHF, Arrythmias:Atrial Fibrillation; Risk Factors:Diabetes and Hypertension.  Sonographer:     Dondra Prader RVT Referring Phys:  [id number] KARDIE TOBB Diagnosing Phys: Lavona Mound Tobb DO  PROCEDURE: After discussion of the risks and benefits of a TEE, an informed consent was obtained from the patient. The transesophogeal probe was passed without difficulty through the esophogus of the patient. Local oropharyngeal anesthetic was provided with viscous lidocaine. Sedation performed by different physician. The patient developed no complications during the procedure.  IMPRESSIONS   1. Left ventricular ejection fraction, by estimation, is 55 to 60%. The left ventricle has normal function. Left ventricular diastolic function could not be evaluated. 2. Right ventricular systolic function is normal. The right ventricular size is normal. 3. Watchman device well seated in the left atrial appendage. No clots. No noted residual flow/leak noted on color doppler. No left atrial/left atrial appendage thrombus was detected. 4. Device lead noted in the right atrium. There are two highly mobile echogenic material off the device lead consistent with a clot/thrombus. 5. The mitral valve is abnormal. Moderate to severe mitral valve regurgitation. No evidence of mitral stenosis. 6. The aortic valve is calcified. Aortic valve regurgitation is not visualized.  FINDINGS Left Ventricle: Left ventricular ejection fraction, by estimation, is 55 to 60%. The left ventricle has normal function. The left ventricular internal cavity size was normal in size. Left ventricular diastolic function could  not be evaluated.  Right Ventricle: The right ventricular size is normal. No increase in right ventricular wall thickness. Right ventricular systolic function is normal.  Left Atrium: Watchman device well seated in the left atrial appendage. No clots. No noted residual flow/leak noted on color doppler. Left atrial size was normal in size. No left atrial/left atrial appendage thrombus was detected.  Right Atrium: Device lead noted in the right atrium. There are two highly mobile echogenic material off the device lead consistent with a clot/thrombus. Right atrial size was normal in size.  Pericardium: There is no evidence of  pericardial effusion.  Mitral Valve: The mitral valve is abnormal. Moderate to severe mitral valve regurgitation. No evidence of mitral valve stenosis.  Tricuspid Valve: The tricuspid valve is normal in structure. Tricuspid valve regurgitation is trivial. No evidence of tricuspid stenosis.  Aortic Valve: The aortic valve is calcified. Aortic valve regurgitation is not visualized.  Pulmonic Valve: The pulmonic valve was not well visualized. Pulmonic valve regurgitation is not visualized. No evidence of pulmonic stenosis.  Aorta: The aortic root and ascending aorta are structurally normal, with no evidence of dilitation. There is minimal (Grade I) layered plaque involving the descending aorta.  Venous: The right upper pulmonary vein, right lower pulmonary vein, left upper pulmonary vein and left lower pulmonary vein are normal. A pattern of systolic flow reversal, suggestive of severe mitral regurgitation is recorded from the left upper pulmonary vein.  IAS/Shunts: No atrial level shunt detected by color flow Doppler.    AORTA Ao Root diam: 2.90 cm Ao Asc diam:  3.00 cm  MR Peak grad:    117.1 mmHg MR Mean grad:    73.0 mmHg MR Vmax:         541.00 cm/s MR Vmean:        391.0 cm/s MR PISA:         2.26 cm MR PISA Eff ROA: 16 mm MR PISA Radius:  0.60  cm  Kardie Tobb DO Electronically signed by Thomasene Ripple DO Signature Date/Time: 08/12/2022/1:36:26 PM    Final (Updated)   MONITORS  CARDIAC EVENT MONITOR 09/01/2017  Narrative Amanda [name], DOB [birth year], MRN [id number]  EVENT MONITOR REPORT:   Patient was monitored from 09/01/2017 to 09/30/2017. Indication:                    Paroxysmal atrial fibrillation Ordering physician:  Georgeanna Lea, MD Referring physician:  Georgeanna Lea MD  Patient wear event recorder for 26 days.  The eighth trigger events.   Baseline rhythm: Sinus rhythm   Atrial arrhythmia:.  Of APCs felt as fluttering or palpitations.  No clear-cut sustained arrhythmia.  No clear-cut atrial fibrillation identified  Ventricular arrhythmia: None  Conduction abnormality: None  Symptoms: A trigger events some because of palpitations patient got some APCs during those episodes.   Conclusion: APCs felt as palpitations. No recurrence of atrial fibrillation noted.  Interpreting  cardiologist: Gypsy Balsam, MD Date: 10/11/2017 10:12 AM           Risk Assessment/Calculations:    CHA2DS2-VASc Score = 9   This indicates a 12.2% annual risk of stroke. The patient's score is based upon: CHF History: 1 HTN History: 1 Diabetes History: 1 Stroke History: 2 Vascular Disease History: 1 Age Score: 2 Gender Score: 1            Physical Exam:   VS:  BP 120/70 (BP Location: Right Arm, Patient Position: Sitting, Cuff Size: Normal)   Pulse 80   Ht 5\' 3"  (1.6 m)   Wt 180 lb (81.6 kg)   SpO2 98%   BMI 31.89 kg/m    Wt Readings from Last 3 Encounters:  09/18/22 180 lb (81.6 kg)  08/26/22 182 lb (82.6 kg)  08/07/22 184 lb 6.4 oz (83.6 kg)    GEN: Well nourished, well developed in no acute distress NECK: No JVD; No carotid bruits CARDIAC: irregularly irregular, no murmurs, rubs, gallops RESPIRATORY:  Clear to auscultation without rales, wheezing or rhonchi  ABDOMEN: Soft, non-tender,  non-distended EXTREMITIES: +1 pitting edema; No deformity  ASSESSMENT AND PLAN: .   Paroxysmal atrial flutter/hypercoagulable state/presence of Watchman device - s/p DCCV x 3; unable to tolerate anticoagulation secondary to GI bleed; presence of Watchman device 2018. Presented for TEE/DCCV on 08/12/22, during TEE found intracardiac thrombus.  Eventually cleared to start low dose Eliquis by GI. Today, she is in AF rate controlled. Continue amiodarone 200 mg twice daily.  Continue Eliquis 2.5 mg twice daily. Will repeat CBC for worsening anemia, BMET. Will arrange for TEE/DCCV--we discussed potential complications and indications, she is well versed as she has undergone several cardioversions, she has no questions and this was discussed to her satisfaction.  CAD - PTCA DES mid RCA 2018; Stable with no anginal symptoms. No indication for ischemic evaluation.  Is not on aspirin secondary to GI bleed.  Previously was on metoprolol however this was discontinued in 2023 secondary to a syncopal episode.  Continue Imdur 30 mg daily, continue nitroglycerin as needed--has not needed, continue Ranexa 500 mg twice daily.  Continue Repatha continue Pravachol.   Hypertension-blood pressure is controlled at 120/70, continue Norvasc 5 mg daily, continue hydralazine 50 mg every 6 hours.  Pedal edema-continue Demadex 10 mg BID. Repeat BMET today.   CKD stage IV - follows with nephrology, saw a few weeks ago, stable per her report.        Dispo: TEE/DCCV, repeat CBC and BMET today. Return ~ 10/14/22 with Dr. Bing Matter.   Informed Consent   Shared Decision Making/Informed Consent The risks [stroke, cardiac arrhythmias rarely resulting in the need for a temporary or permanent pacemaker, skin irritation or burns, esophageal damage, perforation (1:10,000 risk), bleeding, pharyngeal hematoma as well as other potential complications associated with conscious sedation including aspiration, arrhythmia, respiratory failure and  death], benefits (treatment guidance, restoration of normal sinus rhythm, diagnostic support) and alternatives of a transesophageal echocardiogram guided cardioversion were discussed in detail with Amanda [name] and she is willing to proceed.     Signed, Flossie Dibble, NP

## 2022-09-18 ENCOUNTER — Telehealth: Payer: Self-pay

## 2022-09-18 ENCOUNTER — Encounter: Payer: Self-pay | Admitting: Cardiology

## 2022-09-18 ENCOUNTER — Ambulatory Visit: Payer: Medicare Other | Attending: Cardiology | Admitting: Cardiology

## 2022-09-18 VITALS — BP 120/70 | HR 80 | Ht 63.0 in | Wt 180.0 lb

## 2022-09-18 DIAGNOSIS — Z01812 Encounter for preprocedural laboratory examination: Secondary | ICD-10-CM

## 2022-09-18 DIAGNOSIS — I480 Paroxysmal atrial fibrillation: Secondary | ICD-10-CM

## 2022-09-18 DIAGNOSIS — I2510 Atherosclerotic heart disease of native coronary artery without angina pectoris: Secondary | ICD-10-CM

## 2022-09-18 DIAGNOSIS — I10 Essential (primary) hypertension: Secondary | ICD-10-CM | POA: Diagnosis not present

## 2022-09-18 NOTE — Telephone Encounter (Signed)
Pt viewed results on My Chart per Dr. Krasowski's note. Routed to PCP.  

## 2022-09-18 NOTE — Patient Instructions (Signed)
Medication Instructions:  Your physician recommends that you continue on your current medications as directed. Please refer to the Current Medication list given to you today.  *If you need a refill on your cardiac medications before your next appointment, please call your pharmacy*   Lab Work: Your physician recommends that you return for lab work in: Today for CBC and BMP  If you have labs (blood work) drawn today and your tests are completely normal, you will receive your results only by: MyChart Message (if you have MyChart) OR A paper copy in the mail If you have any lab test that is abnormal or we need to change your treatment, we will call you to review the results.   Testing/Procedures:     Dear Amanda [name]  You are scheduled for a TEE (Transesophageal Echocardiogram) Guided Cardioversion on Wednesday, August 28 with Dr. Cristal Deer.  Please arrive at the [facility] (Main Entrance A) at [facility]: [address] [place], [place] [address] at 9:30 AM (This time is 1/1/2 hour(s) before your procedure to ensure your preparation). Free valet parking service is available. You will check in at ADMITTING. The support person will be asked to wait in the waiting room.  It is OK to have someone drop you off and come back when you are ready to be discharged.     DIET:  Nothing to eat or drink after midnight except a sip of water with medications (see medication instructions below)  MEDICATION INSTRUCTIONS: !!IF ANY NEW MEDICATIONS ARE STARTED AFTER TODAY, PLEASE NOTIFY YOUR PROVIDER AS SOON AS POSSIBLE!!  FYI: Medications such as Semaglutide (Ozempic, [place]), Tirzepatide (Mounjaro, Zepbound), Dulaglutide (Trulicity), etc ("GLP1 agonists") AND Canagliflozin (Invokana), Dapagliflozin (Farxiga), Empagliflozin (Jardiance), Ertugliflozin (Steglatro), Bexagliflozin [employer]) or any combination with one of these drugs such as Invokamet (Canagliflozin/Metformin), Synjardy  (Empagliflozin/Metformin), etc ("SGLT2 inhibitors") must be held around the time of a procedure. This is not a comprehensive list of all of these drugs. Please review all of your medications and talk to your provider if you take any one of these. If you are not sure, ask your provider.  { Continue taking your anticoagulant (blood thinner): Apixaban (Eliquis).  You will need to continue this after your procedure until you are told by your provider that it is safe to stop.    Do not take the Torsemide day of procedure   FYI:  For your safety, and to allow [place] to monitor your vital signs accurately during the surgery/procedure we request: If you have artificial nails, gel coating, SNS etc, please have those removed prior to your surgery/procedure. Not having the nail coverings /polish removed may result in cancellation or delay of your surgery/procedure.  You must have a responsible person to drive you home and stay in the waiting area during your procedure. Failure to do so could result in cancellation.  Bring your insurance cards.  *Special Note: Every effort is made to have your procedure done on time. Occasionally there are emergencies that occur at the hospital that may cause delays. Please be patient if a delay does occur.       Follow-Up: At [facility], you and your health needs are our priority.  As part of our continuing mission to provide you with exceptional heart care, we have created designated Provider Care Teams.  These Care Teams include your primary Cardiologist (physician) and Advanced Practice Providers (APPs -  Physician Assistants and Nurse Practitioners) who all work together to provide you  with the care you need, when you need it.  We recommend signing up for the patient portal called "MyChart".  Sign up information is provided on this After Visit Summary.  MyChart is used to connect with patients for Virtual Visits (Telemedicine).  Patients are able to view  lab/test results, encounter notes, upcoming appointments, etc.  Non-urgent messages can be sent to your provider as well.   To learn more about what you can do with MyChart, go to [email or web address].    Your next appointment:   3 week(s)  Provider:   Gypsy Balsam, MD    Other Instructions

## 2022-09-19 LAB — CBC WITH DIFFERENTIAL/PLATELET
Basophils Absolute: 0 x10E3/uL (ref 0.0–0.2)
Basos: 1 %
EOS (ABSOLUTE): 0.3 x10E3/uL (ref 0.0–0.4)
Eos: 6 %
Hematocrit: 30 % — ABNORMAL LOW (ref 34.0–46.6)
Hemoglobin: 9.6 g/dL — ABNORMAL LOW (ref 11.1–15.9)
Immature Grans (Abs): 0 x10E3/uL (ref 0.0–0.1)
Immature Granulocytes: 0 %
Lymphocytes Absolute: 0.5 x10E3/uL — ABNORMAL LOW (ref 0.7–3.1)
Lymphs: 12 %
MCH: 30.2 pg (ref 26.6–33.0)
MCHC: 32 g/dL (ref 31.5–35.7)
MCV: 94 fL (ref 79–97)
Monocytes Absolute: 0.5 x10E3/uL (ref 0.1–0.9)
Monocytes: 11 %
Neutrophils Absolute: 3 x10E3/uL (ref 1.4–7.0)
Neutrophils: 70 %
Platelets: 212 x10E3/uL (ref 150–450)
RBC: 3.18 x10E6/uL — ABNORMAL LOW (ref 3.77–5.28)
RDW: 13.7 % (ref 11.7–15.4)
WBC: 4.3 x10E3/uL (ref 3.4–10.8)

## 2022-09-19 LAB — BASIC METABOLIC PANEL WITH GFR
BUN/Creatinine Ratio: 18 (ref 12–28)
BUN: 36 mg/dL — ABNORMAL HIGH (ref 8–27)
CO2: 23 mmol/L (ref 20–29)
Calcium: 10.4 mg/dL — ABNORMAL HIGH (ref 8.7–10.3)
Chloride: 108 mmol/L — ABNORMAL HIGH (ref 96–106)
Creatinine, Ser: 1.95 mg/dL — ABNORMAL HIGH (ref 0.57–1.00)
Glucose: 166 mg/dL — ABNORMAL HIGH (ref 70–99)
Potassium: 5.1 mmol/L (ref 3.5–5.2)
Sodium: 144 mmol/L (ref 134–144)
eGFR: 25 mL/min/1.73 — ABNORMAL LOW

## 2022-09-21 ENCOUNTER — Telehealth: Payer: Self-pay | Admitting: *Deleted

## 2022-09-21 NOTE — Telephone Encounter (Signed)
-----   Message from Flossie Dibble sent at 09/21/2022  7:27 AM EDT ----- Labs are stable, abnormal but stable.  Kidney function is slightly better. Stable hemoglobin and hematocrit, although slight drop--discussed with Dr. Bing Matter and he wants her to continue Eliquis in preparation for upcoming DCCV.

## 2022-09-21 NOTE — Telephone Encounter (Signed)
 Informed pt of lab results. Pt verbalized understanding and had no further questions.

## 2022-09-22 DIAGNOSIS — E1122 Type 2 diabetes mellitus with diabetic chronic kidney disease: Secondary | ICD-10-CM | POA: Diagnosis not present

## 2022-09-22 DIAGNOSIS — Z6831 Body mass index (BMI) 31.0-31.9, adult: Secondary | ICD-10-CM | POA: Diagnosis not present

## 2022-09-22 DIAGNOSIS — N184 Chronic kidney disease, stage 4 (severe): Secondary | ICD-10-CM | POA: Diagnosis not present

## 2022-09-22 DIAGNOSIS — I129 Hypertensive chronic kidney disease with stage 1 through stage 4 chronic kidney disease, or unspecified chronic kidney disease: Secondary | ICD-10-CM | POA: Diagnosis not present

## 2022-09-24 ENCOUNTER — Ambulatory Visit (HOSPITAL_COMMUNITY)
Admission: RE | Admit: 2022-09-24 | Discharge: 2022-09-24 | Disposition: A | Discharge status: Home / Self Care | Payer: Medicare Other | Source: Ambulatory Visit | Attending: Family Medicine | Admitting: Family Medicine

## 2022-09-24 DIAGNOSIS — N184 Chronic kidney disease, stage 4 (severe): Secondary | ICD-10-CM | POA: Insufficient documentation

## 2022-09-24 DIAGNOSIS — R053 Chronic cough: Secondary | ICD-10-CM | POA: Diagnosis not present

## 2022-09-24 DIAGNOSIS — I5022 Chronic systolic (congestive) heart failure: Secondary | ICD-10-CM | POA: Diagnosis not present

## 2022-09-24 DIAGNOSIS — K219 Gastro-esophageal reflux disease without esophagitis: Secondary | ICD-10-CM | POA: Insufficient documentation

## 2022-09-24 DIAGNOSIS — E079 Disorder of thyroid, unspecified: Secondary | ICD-10-CM | POA: Diagnosis not present

## 2022-09-24 DIAGNOSIS — I2510 Atherosclerotic heart disease of native coronary artery without angina pectoris: Secondary | ICD-10-CM | POA: Diagnosis not present

## 2022-09-24 DIAGNOSIS — E1122 Type 2 diabetes mellitus with diabetic chronic kidney disease: Secondary | ICD-10-CM | POA: Insufficient documentation

## 2022-09-24 DIAGNOSIS — I130 Hypertensive heart and chronic kidney disease with heart failure and stage 1 through stage 4 chronic kidney disease, or unspecified chronic kidney disease: Secondary | ICD-10-CM | POA: Insufficient documentation

## 2022-09-24 DIAGNOSIS — Z8673 Personal history of transient ischemic attack (TIA), and cerebral infarction without residual deficits: Secondary | ICD-10-CM | POA: Insufficient documentation

## 2022-09-24 DIAGNOSIS — R1310 Dysphagia, unspecified: Secondary | ICD-10-CM | POA: Insufficient documentation

## 2022-09-24 DIAGNOSIS — K449 Diaphragmatic hernia without obstruction or gangrene: Secondary | ICD-10-CM | POA: Insufficient documentation

## 2022-09-24 DIAGNOSIS — I4891 Unspecified atrial fibrillation: Secondary | ICD-10-CM | POA: Diagnosis not present

## 2022-09-24 DIAGNOSIS — G4733 Obstructive sleep apnea (adult) (pediatric): Secondary | ICD-10-CM | POA: Diagnosis not present

## 2022-09-24 DIAGNOSIS — K224 Dyskinesia of esophagus: Secondary | ICD-10-CM

## 2022-09-24 DIAGNOSIS — I252 Old myocardial infarction: Secondary | ICD-10-CM | POA: Insufficient documentation

## 2022-09-24 NOTE — Evaluation (Signed)
Modified Barium Swallow Study  Patient Details  Name: Amanda [name] MRN: [id number] Date of Birth: 02-Jul-1940  Today's Date: 09/24/2022  Modified Barium Swallow completed.  Full report located under Chart Review in the Imaging Section.  History of Present Illness Pt is an 82 yo female who presents for OP MBS after recent esophagram 09/02/22 that revealed probably small volume aspiration with prone positioning. 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 Esophagram was completed due to pt's report of foods feeling stuck. PMH also includes: GERD, stroke, OSA, CKD, CAD, afib, DM, thyroid disease, NSTEMI   Clinical Impression Pt's oropharyngeal swallow is grossly functional. She does have reduced PES opening that intermittently leads to small amounts of residue in her pyriform sinuses, but she clears her pharynx well upon subsequent swallows. She had trace, transient penetration (PAS 2) with thin liquids, but this is considered to be normal. Suspect that aspiration observed on esophagram may have been positional in nature, given that she was in the prone position when it occurred. Pt denies eating and drinking in this position at home. Pt's subjective symptoms are less likely to be explained by oropharyngeal findings and are suspected to be related to esophageal findings noted on esophagram. No further SLP f/u indicated at this time.  Factors that may increase risk of adverse event in presence of aspiration Rubye Oaks & Clearance Coots 2021): Respiratory or GI disease  Swallow Evaluation Recommendations Recommendations: PO diet PO Diet Recommendation: Regular;Thin liquids (Level 0) Liquid Administration via: Cup;Straw Medication Administration: Whole meds with liquid Supervision: Patient able to self-feed Swallowing strategies  : Slow rate;Small bites/sips;Follow solids with  liquids Postural changes: Position pt fully upright for meals;Stay upright 30-60 min after meals Oral care recommendations: Oral care BID (2x/day)      Mahala Menghini., M.A. CCC-SLP Acute Rehabilitation Services Office [phone]  Secure chat preferred  09/24/2022,1:18 PM

## 2022-09-29 NOTE — Progress Notes (Signed)
Spoke with patient, Procedure scheduled for  0830, Please arrive at the hospital at  0730, NPO after midnight on Tuesday, May take meds with sips of water in the AM, please have transportation for home post procedure, and someone to stay with pt for approximately 24 hours after  Pt states she has been taking eliquis regularly with no missed doses

## 2022-09-30 ENCOUNTER — Ambulatory Visit (HOSPITAL_BASED_OUTPATIENT_CLINIC_OR_DEPARTMENT_OTHER)
Admission: RE | Admit: 2022-09-30 | Discharge: 2022-09-30 | Disposition: A | Discharge status: Home / Self Care | Payer: Medicare Other | Source: Ambulatory Visit | Attending: Cardiology | Admitting: Cardiology

## 2022-09-30 ENCOUNTER — Ambulatory Visit (HOSPITAL_BASED_OUTPATIENT_CLINIC_OR_DEPARTMENT_OTHER): Payer: Medicare Other | Admitting: Registered Nurse

## 2022-09-30 ENCOUNTER — Encounter (HOSPITAL_COMMUNITY): Payer: Self-pay | Admitting: Cardiology

## 2022-09-30 ENCOUNTER — Ambulatory Visit (HOSPITAL_COMMUNITY)
Admission: RE | Admit: 2022-09-30 | Discharge: 2022-09-30 | Disposition: A | Discharge status: Home / Self Care | Payer: Medicare Other | Attending: Cardiology | Admitting: Cardiology

## 2022-09-30 ENCOUNTER — Ambulatory Visit (HOSPITAL_COMMUNITY): Payer: Medicare Other | Admitting: Registered Nurse

## 2022-09-30 ENCOUNTER — Other Ambulatory Visit: Payer: Self-pay

## 2022-09-30 ENCOUNTER — Encounter (HOSPITAL_COMMUNITY)
Admission: RE | Disposition: A | Discharge status: Home / Self Care | Payer: Self-pay | Source: Home / Self Care | Attending: Cardiology

## 2022-09-30 DIAGNOSIS — I083 Combined rheumatic disorders of mitral, aortic and tricuspid valves: Secondary | ICD-10-CM | POA: Insufficient documentation

## 2022-09-30 DIAGNOSIS — N184 Chronic kidney disease, stage 4 (severe): Secondary | ICD-10-CM

## 2022-09-30 DIAGNOSIS — I4892 Unspecified atrial flutter: Secondary | ICD-10-CM | POA: Insufficient documentation

## 2022-09-30 DIAGNOSIS — I5022 Chronic systolic (congestive) heart failure: Secondary | ICD-10-CM | POA: Diagnosis not present

## 2022-09-30 DIAGNOSIS — I700 Atherosclerosis of aorta: Secondary | ICD-10-CM | POA: Insufficient documentation

## 2022-09-30 DIAGNOSIS — I361 Nonrheumatic tricuspid (valve) insufficiency: Secondary | ICD-10-CM | POA: Diagnosis not present

## 2022-09-30 DIAGNOSIS — D631 Anemia in chronic kidney disease: Secondary | ICD-10-CM | POA: Diagnosis not present

## 2022-09-30 DIAGNOSIS — I2510 Atherosclerotic heart disease of native coronary artery without angina pectoris: Secondary | ICD-10-CM | POA: Diagnosis not present

## 2022-09-30 DIAGNOSIS — I130 Hypertensive heart and chronic kidney disease with heart failure and stage 1 through stage 4 chronic kidney disease, or unspecified chronic kidney disease: Secondary | ICD-10-CM

## 2022-09-30 DIAGNOSIS — E1122 Type 2 diabetes mellitus with diabetic chronic kidney disease: Secondary | ICD-10-CM | POA: Diagnosis not present

## 2022-09-30 DIAGNOSIS — Z95818 Presence of other cardiac implants and grafts: Secondary | ICD-10-CM | POA: Insufficient documentation

## 2022-09-30 DIAGNOSIS — I480 Paroxysmal atrial fibrillation: Secondary | ICD-10-CM | POA: Insufficient documentation

## 2022-09-30 DIAGNOSIS — D6859 Other primary thrombophilia: Secondary | ICD-10-CM | POA: Diagnosis not present

## 2022-09-30 DIAGNOSIS — Z8673 Personal history of transient ischemic attack (TIA), and cerebral infarction without residual deficits: Secondary | ICD-10-CM | POA: Diagnosis not present

## 2022-09-30 DIAGNOSIS — G4733 Obstructive sleep apnea (adult) (pediatric): Secondary | ICD-10-CM | POA: Diagnosis not present

## 2022-09-30 DIAGNOSIS — Z79899 Other long term (current) drug therapy: Secondary | ICD-10-CM | POA: Diagnosis not present

## 2022-09-30 DIAGNOSIS — I4891 Unspecified atrial fibrillation: Secondary | ICD-10-CM

## 2022-09-30 DIAGNOSIS — Z7901 Long term (current) use of anticoagulants: Secondary | ICD-10-CM | POA: Insufficient documentation

## 2022-09-30 DIAGNOSIS — Z955 Presence of coronary angioplasty implant and graft: Secondary | ICD-10-CM | POA: Diagnosis not present

## 2022-09-30 DIAGNOSIS — E785 Hyperlipidemia, unspecified: Secondary | ICD-10-CM | POA: Insufficient documentation

## 2022-09-30 DIAGNOSIS — R600 Localized edema: Secondary | ICD-10-CM | POA: Insufficient documentation

## 2022-09-30 DIAGNOSIS — I442 Atrioventricular block, complete: Secondary | ICD-10-CM | POA: Insufficient documentation

## 2022-09-30 DIAGNOSIS — I340 Nonrheumatic mitral (valve) insufficiency: Secondary | ICD-10-CM | POA: Diagnosis not present

## 2022-09-30 HISTORY — PX: CARDIOVERSION: SHX1299

## 2022-09-30 HISTORY — PX: TEE WITHOUT CARDIOVERSION: SHX5443

## 2022-09-30 LAB — GLUCOSE, CAPILLARY: Glucose-Capillary: 162 mg/dL — ABNORMAL HIGH (ref 70–99)

## 2022-09-30 SURGERY — ECHOCARDIOGRAM, TRANSESOPHAGEAL
Anesthesia: General

## 2022-09-30 MED ORDER — LIDOCAINE 2% (20 MG/ML) 5 ML SYRINGE
INTRAMUSCULAR | Status: DC | PRN
  Administered 2022-09-30: 80 mg via INTRAVENOUS

## 2022-09-30 MED ORDER — PROPOFOL 10 MG/ML IV BOLUS
INTRAVENOUS | Status: DC | PRN
  Administered 2022-09-30: 100 ug/kg/min via INTRAVENOUS
  Administered 2022-09-30: 50 mg via INTRAVENOUS

## 2022-09-30 MED ORDER — PHENYLEPHRINE HCL (PRESSORS) 10 MG/ML IV SOLN
INTRAVENOUS | Status: DC | PRN
  Administered 2022-09-30 (×2): 160 ug via INTRAVENOUS

## 2022-09-30 MED ORDER — SODIUM CHLORIDE 0.9 % IV SOLN: INTRAVENOUS | Status: DC

## 2022-09-30 SURGICAL SUPPLY — 1 items: ELECT DEFIB PAD ADLT CADENCE (PAD) ×1 IMPLANT

## 2022-09-30 NOTE — Transfer of Care (Signed)
Immediate Anesthesia Transfer of Care Note  Patient: Amanda [name]  Procedure(s) Performed: TRANSESOPHAGEAL ECHOCARDIOGRAM CARDIOVERSION  Patient Location: PACU  Anesthesia Type:MAC  Level of Consciousness: drowsy  Airway & Oxygen Therapy: Patient Spontanous Breathing  Post-op Assessment: Report given to RN and Post -op Vital signs reviewed and stable  Post vital signs: Reviewed and stable  Last Vitals:  Vitals Value Taken Time  BP 121/48   Temp    Pulse    Resp    SpO2 97     Last Pain:  Vitals:   09/30/22 0750  TempSrc:   PainSc: 0-No pain         Complications: There were no known notable events for this encounter.

## 2022-09-30 NOTE — CV Procedure (Addendum)
   TRANSESOPHAGEAL ECHOCARDIOGRAM GUIDED DIRECT CURRENT CARDIOVERSION  NAME:  Amanda [name]   MRN: [id number] DOB:  1940-11-24   ADMIT DATE: 09/30/2022  INDICATIONS: Symptomatic atrial fibrillation, concern for thrombus on pacer leads  PROCEDURE:   Informed consent was obtained prior to the procedure. The risks, benefits and alternatives for the procedure were discussed and the patient comprehended these risks.  Risks include, but are not limited to, cough, sore throat, vomiting, nausea, somnolence, esophageal and stomach trauma or perforation, bleeding, low blood pressure, aspiration, pneumonia, infection, trauma to the teeth and death.    After a procedural time-out, the oropharynx was anesthetized and the patient was sedated by the anesthesia service. The transesophageal probe was inserted in the esophagus and stomach without difficulty and multiple views were obtained. Anesthesia was monitored by Dr. Maple Hudson. Patient received 80 mg IV lidocaine and 270 mg IV propofol.  COMPLICATIONS:    Complications: No complications Patient tolerated procedure well.  FINDINGS:  LEFT VENTRICLE: EF = 55-60% No regional wall motion abnormalities.  RIGHT VENTRICLE: Normal size and function.   LEFT ATRIUM: No thrombus/mass.  LEFT ATRIAL APPENDAGE: Watchman in place, well seated, without flow noted.  RIGHT ATRIUM: There are two small fibrinous strands, one on the RA lead and one on the RV lead. These appear similar to prior.  AORTIC VALVE:  Trileaflet. No regurgitation. No vegetation.  MITRAL VALVE:    Normal structure. Moderate to severe regurgitation. No vegetation.  TRICUSPID VALVE: Normal structure. Severe regurgitation. No vegetation.  PULMONIC VALVE: Grossly normal structure. Trivial regurgitation. No apparent vegetation.  INTERATRIAL SEPTUM: No PFO or ASD seen by color Doppler. Negative right to left shunt using agitated saline.  PERICARDIUM: No effusion noted.  DESCENDING AORTA:  Moderate to severe diffuse plaque seen   CARDIOVERSION:     Indications:  Symptomatic Atrial Fibrillation  Procedure Details:  Once the TEE was complete, the patient had the defibrillator pads placed in the anterior and posterior position. Once an appropriate level of sedation was confirmed, the patient was cardioverted x 1 with 120J of biphasic synchronized energy.  The patient converted to atrial paced, ventricular paced rhythm.  There were no apparent complications.  The patient had normal neuro status and respiratory status post procedure with vitals stable as recorded elsewhere.  Adequate airway was maintained throughout and vital signs monitored per protocol.  Jodelle Red, MD, PhD, [facility] [place]  [facility]  [place]  Heart & Vascular at [facility] at [facility] [address], [address] [place], [place] [address] [phone]   9:30 AM

## 2022-09-30 NOTE — Interval H&P Note (Signed)
History and Physical Interval Note:  09/30/2022 8:32 AM  Amanda [name]  has presented today for surgery, with the diagnosis of AFIB.  The various methods of treatment have been discussed with the patient and family. After consideration of risks, benefits and other options for treatment, the patient has consented to  Procedure(s): TRANSESOPHAGEAL ECHOCARDIOGRAM (N/A) CARDIOVERSION (N/A) as a surgical intervention.  The patient's history has been reviewed, patient examined, no change in status, stable for surgery.  I have reviewed the patient's chart and labs.  Questions were answered to the patient's satisfaction.     [name] Cristal Deer

## 2022-09-30 NOTE — Anesthesia Preprocedure Evaluation (Signed)
Anesthesia Evaluation  Patient identified by MRN, date of birth, ID band Patient awake    Reviewed: Allergy & Precautions, NPO status , Patient's Chart, lab work & pertinent test results  Airway Mallampati: III  TM Distance: >3 FB Neck ROM: Full    Dental  (+) Dental Advisory Given, Teeth Intact   Pulmonary sleep apnea (noncompliant w/ cpap) , former smoker   breath sounds clear to auscultation       Cardiovascular hypertension, Pt. on medications pulmonary hypertension (mild pHTN)+ CAD, + Past MI and +CHF  + dysrhythmias Atrial Fibrillation + pacemaker + Valvular Problems/Murmurs (mild MS) MR and AS  Rhythm:Irregular Rate:Normal + Systolic murmurs Pacemaker interrogation AP VP Percent:0.01 % AP VS Percent:1.82 % AS VP Percent:0.04 % AS VS Percent:98.13 % RA Percent Paced:1.81 % RV Percent Paced:0.05 %   Echo 04/2022  1. Left ventricular ejection fraction, by estimation, is 70 to 75%. The left ventricle has hyperdynamic function. The left ventricle has no regional wall motion abnormalities. There is moderate left ventricular hypertrophy. Left ventricular diastolic parameters are consistent with Grade II diastolic dysfunction (pseudonormalization). The average left ventricular global longitudinal strain is 16.4 %. The global longitudinal strain is abnormal.   2. Right ventricular systolic function is normal. The right ventricular size is normal. There is normal pulmonary artery systolic pressure.   3. Left atrial size was mildly dilated.   4. The mitral valve is normal in structure. Moderate mitral valve regurgitation. No evidence of mitral stenosis. Moderate mitral annular calcification.   5. The aortic valve is calcified. There is mild calcification of the aortic valve. There is mild thickening of the aortic valve. Aortic valve regurgitation is not visualized. Mild aortic valve stenosis. Aortic valve mean gradient measures 8.4 mmHg.    6. The inferior vena cava is normal in size with greater than 50% respiratory variability, suggesting right atrial pressure of 3 mmHg.     Echo 2023  1. Left ventricular ejection fraction, by estimation, is 60 to 65%. The  left ventricle has normal function. The left ventricle has no regional  wall motion abnormalities. There is severe concentric left ventricular  hypertrophy. Left ventricular diastolic   parameters are indeterminate.   2. Right ventricular systolic function is mildly reduced. The right  ventricular size is normal. There is mildly elevated pulmonary artery  systolic pressure.   3. Mean gradient 2 mm Hg at 57 BPM. The mitral valve is degenerative.  Moderate mitral valve regurgitation. Mild mitral stenosis. Moderate mitral  annular calcification.   4. The aortic valve is normal in structure. Aortic valve regurgitation is  not visualized. Aortic valve sclerosis is present, with no evidence of  aortic valve stenosis.   5. The inferior vena cava is normal in size with greater than 50%  respiratory variability, suggesting right atrial pressure of 3 mmHg.   S/p watchman 2021   Neuro/Psych CVA  negative psych ROS   GI/Hepatic Neg liver ROS,GERD  Controlled and Medicated,,  Endo/Other  diabetes, Well Controlled, Type 2, Insulin Dependent    Renal/GU CRFRenal diseaseLab Results      Component                Value               Date                      NA  144                 09/18/2022                K                        5.1                 09/18/2022                CO2                      23                  09/18/2022                GLUCOSE                  166 (H)             09/18/2022                BUN                      36 (H)              09/18/2022                CREATININE               1.95 (H)            09/18/2022                CALCIUM                  10.4 (H)            09/18/2022                GFR                       20.95 (L)           08/26/2022                EGFR                     25 (L)              09/18/2022                GFRNONAA                 26 (L)              05/16/2021                Musculoskeletal negative musculoskeletal ROS (+)    Abdominal   Peds  Hematology  (+) Blood dyscrasia, anemia   Anesthesia Other Findings   Reproductive/Obstetrics negative OB ROS                              Anesthesia Physical Anesthesia Plan  ASA: 3  Anesthesia Plan: MAC and General   Post-op Pain Management: Minimal or no pain anticipated   Induction: Intravenous  PONV Risk Score and Plan: 2 and TIVA, Treatment may vary due to age or medical condition and Propofol infusion  Airway Management Planned:  Additional Equipment: None  Intra-op Plan:   Post-operative Plan:   Informed Consent: I have reviewed the patients History and Physical, chart, labs and discussed the procedure including the risks, benefits and alternatives for the proposed anesthesia with the patient or authorized representative who has indicated his/her understanding and acceptance.     Dental advisory given  Plan Discussed with: CRNA  Anesthesia Plan Comments:          Anesthesia Quick Evaluation

## 2022-09-30 NOTE — Anesthesia Postprocedure Evaluation (Signed)
Anesthesia Post Note  Patient: Amanda [name]  Procedure(s) Performed: TRANSESOPHAGEAL ECHOCARDIOGRAM CARDIOVERSION     Patient location during evaluation: Cath Lab Anesthesia Type: General Level of consciousness: awake and alert Pain management: pain level controlled Vital Signs Assessment: post-procedure vital signs reviewed and stable Respiratory status: spontaneous breathing, nonlabored ventilation and respiratory function stable Cardiovascular status: blood pressure returned to baseline and stable Postop Assessment: no apparent nausea or vomiting Anesthetic complications: no   There were no known notable events for this encounter.  Last Vitals:  Vitals:   09/30/22 0938 09/30/22 0945  BP: (!) 121/48 116/63  Pulse: (!) 50 (!) 50  Resp: 16 19  Temp: (!) 36.4 C 36.6 C  SpO2: 97% 96%    Last Pain:  Vitals:   09/30/22 0945  TempSrc: Temporal  PainSc: 0-No pain                 Ambers Iyengar

## 2022-09-30 NOTE — Progress Notes (Signed)
  Echocardiogram Echocardiogram Transesophageal has been performed.  Delcie Roch 09/30/2022, 9:45 AM

## 2022-10-01 ENCOUNTER — Encounter (HOSPITAL_COMMUNITY): Payer: Self-pay | Admitting: Cardiology

## 2022-10-01 LAB — ECHO TEE
MV M vel: 5.71 m/s
MV Peak grad: 130.4 mmHg
Radius: 0.55 cm

## 2022-10-02 ENCOUNTER — Other Ambulatory Visit: Payer: Self-pay

## 2022-10-02 DIAGNOSIS — R195 Other fecal abnormalities: Secondary | ICD-10-CM

## 2022-10-02 DIAGNOSIS — D649 Anemia, unspecified: Secondary | ICD-10-CM

## 2022-10-06 ENCOUNTER — Telehealth: Payer: Self-pay | Admitting: Cardiology

## 2022-10-06 NOTE — Telephone Encounter (Signed)
Pt states she had a cardioversion last week but she is back in afib. She denies any SOB, or CP. She wants to know what she should do, please advise.

## 2022-10-06 NOTE — Telephone Encounter (Signed)
Spoke with pt she stated that she is back in Afib after her Cardioversion. Advised per Dr. Bing Matter to come in for an EKG to confirm Afib and will refer to ER team. Pt agreed and verbalized understanding. Sent to front desk for scheduling.

## 2022-10-07 ENCOUNTER — Ambulatory Visit: Payer: Medicare Other | Attending: Cardiology

## 2022-10-07 VITALS — BP 100/70 | HR 84 | Ht 63.0 in | Wt 177.0 lb

## 2022-10-07 DIAGNOSIS — I4892 Unspecified atrial flutter: Secondary | ICD-10-CM | POA: Diagnosis not present

## 2022-10-07 NOTE — Progress Notes (Signed)
   Nurse Visit   Date of Encounter: 10/07/2022 ID: Amanda [name], DOB 1940-11-21, MRN [id number]  PCP:  Street, Stephanie Coup, MD   [facility] Providers Cardiologist:  Gypsy Balsam, MD Electrophysiologist:  Regan Lemming, MD      Visit Details   VS:  BP 100/70 (BP Location: Left Arm, Patient Position: Sitting, Cuff Size: Normal)   Pulse 84   Ht 5\' 3"  (1.6 m)   Wt 177 lb (80.3 kg)   SpO2 97%   BMI 31.35 kg/m  , BMI Body mass index is 31.35 kg/m.  Wt Readings from Last 3 Encounters:  10/07/22 177 lb (80.3 kg)  09/30/22 179 lb 14.3 oz (81.6 kg)  09/18/22 180 lb (81.6 kg)     Reason for visit: EKG to verify AFib Performed today: Vitals, EKG Changes (medications, testing, etc.) : No changes. Refer to Dr. Elberta Fortis for eval of ablation Length of Visit: 15 minutes    Medications Adjustments/Labs and Tests Ordered: Orders Placed This Encounter  Procedures   EKG 12-Lead   No orders of the defined types were placed in this encounter.    Signed, Neena Rhymes, RN  10/07/2022 9:30 AM

## 2022-10-15 ENCOUNTER — Ambulatory Visit: Payer: Medicare Other | Attending: Cardiology | Admitting: Cardiology

## 2022-10-15 ENCOUNTER — Encounter: Payer: Self-pay | Admitting: Cardiology

## 2022-10-15 VITALS — BP 130/78 | HR 103 | Ht 63.0 in | Wt 183.0 lb

## 2022-10-15 DIAGNOSIS — I480 Paroxysmal atrial fibrillation: Secondary | ICD-10-CM

## 2022-10-15 DIAGNOSIS — I5022 Chronic systolic (congestive) heart failure: Secondary | ICD-10-CM | POA: Diagnosis not present

## 2022-10-15 DIAGNOSIS — I4892 Unspecified atrial flutter: Secondary | ICD-10-CM

## 2022-10-15 DIAGNOSIS — N184 Chronic kidney disease, stage 4 (severe): Secondary | ICD-10-CM

## 2022-10-15 DIAGNOSIS — I2510 Atherosclerotic heart disease of native coronary artery without angina pectoris: Secondary | ICD-10-CM | POA: Diagnosis not present

## 2022-10-15 DIAGNOSIS — E1122 Type 2 diabetes mellitus with diabetic chronic kidney disease: Secondary | ICD-10-CM

## 2022-10-15 DIAGNOSIS — R0609 Other forms of dyspnea: Secondary | ICD-10-CM

## 2022-10-15 DIAGNOSIS — Z950 Presence of cardiac pacemaker: Secondary | ICD-10-CM

## 2022-10-15 MED ORDER — AMIODARONE HCL 200 MG PO TABS: 200.0000 mg | ORAL_TABLET | Freq: Every day | ORAL | 3 refills | Status: DC

## 2022-10-15 NOTE — Patient Instructions (Signed)
Medication Instructions:   DECREASE: Amiodarone to 200mg  daily   Lab Work:  CBC, ProBNP, BMP- today  If you have labs (blood work) drawn today and your tests are completely normal, you will receive your results only by: MyChart Message (if you have MyChart) OR A paper copy in the mail If you have any lab test that is abnormal or we need to change your treatment, we will call you to review the results.   Testing/Procedures: None Ordered   Follow-Up: At [facility], you and your health needs are our priority.  As part of our continuing mission to provide you with exceptional heart care, we have created designated Provider Care Teams.  These Care Teams include your primary Cardiologist (physician) and Advanced Practice Providers (APPs -  Physician Assistants and Nurse Practitioners) who all work together to provide you with the care you need, when you need it.  We recommend signing up for the patient portal called "MyChart".  Sign up information is provided on this After Visit Summary.  MyChart is used to connect with patients for Virtual Visits (Telemedicine).  Patients are able to view lab/test results, encounter notes, upcoming appointments, etc.  Non-urgent messages can be sent to your provider as well.   To learn more about what you can do with MyChart, go to [email or web address].    Your next appointment:   2 month(s)  The format for your next appointment:   In Person  Provider:   Gypsy Balsam, MD    Other Instructions NA

## 2022-10-15 NOTE — Progress Notes (Signed)
Cardiology Office Note:    Date:  10/15/2022   ID:  Amanda [name], DOB 1940-12-26, MRN [id number]  PCP:  Street, Stephanie Coup, MD  Cardiologist:  Gypsy Balsam, MD    Referring MD: Street, Stephanie Coup, *   Chief Complaint  Patient presents with   Results    History of Present Illness:    Amanda [name] is a 82 y.o. female  past medical history significant for paroxysmal atrial flutter/fibrillation, coronary artery disease, status post PTCA and stenting of the mid RCA in 2018, history of CVA, she is not on anticoagulation because of frequent GI bleed, eventually she end up having Watchman device, also chronic kidney failure with creatinine neighborhood of 1.7, diabetes, essential hypertension, obstructive sleep apnea, moderate to severe mitral regurgitation, severe tricuspid regurgitation.  We have been doing well recently with proximal's of atrial fibrillation.  She did have TEE done with intention to cardiovert her to sinus rhythm however there was assessment of her having thrombosis after that she was put on anticoagulation after 4 weeks TEE has been repeated and then cardioversion has been performed.  She converted to sinus rhythm she felt much better but flipped back to atrial fibrillation within next 4 days.  She is coming today to months to talk about this obviously she is very frustrated so am I.  We talked at length about what can be done in this situation but should be referred back to aortic electrophysiologist colleague to discuss additional options may be dofetilide will be more effective done her amiodarone that she is on right now.  Additional issue is significant mitral regurgitation as well as tricuspid agitation I hope we will be able to improve the situation with cardioversion to sinus rhythm but the dose not appears to be a case.  Past Medical History:  Diagnosis Date   Acute ischemic stroke (HCC)    Acute metabolic encephalopathy    Acute on chronic systolic  (congestive) heart failure (HCC)    Acute renal insufficiency 09/02/2016   Anemia due to stage 4 chronic kidney disease (HCC) 04/04/2015   Atrial fibrillation (HCC)    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) 04/04/2015   Bradycardia 09/17/2014   Chest pain 06/23/2016   Overview:  Added automatically from request for surgery [id number]   Chronic anemia    Chronic kidney disease (CKD), stage III (moderate) (HCC)    Coronary artery disease 10/12/2016   Non drug-eluting stent implanted in June 2018 to mid RCA   08/08/2016 10:37  Angiographic Findings  Cardiac Arteries and Lesion Findings LMCA: 0% and Normal. LAD: 0% and Normal. RCA:   Lesion on Mid RCA: Mid subsection.95% stenosis reduced to 0%. Pre procedure   TIMI II flow was noted. Post Procedure TIMI III flow was present. Poor run   off was present. The lesion was diagnosed as High Risk (C).    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   History of cardiac monitoring 02/23/2018   monitor inserted   History of iron deficiency anemia 12/28/2017   History of transesophageal echocardiography (TEE)    Hyperlipidemia    Hypertensive heart disease with heart failure (HCC)    Iron deficiency anemia 12/28/2017   LV dysfunction 09/02/2016   Myocardial  infarction [facility])    NSTEMI (non-ST elevated myocardial infarction) (HCC) 08/06/2016   Obstructive sleep apnea 09/17/2014   Orthostatic hypotension 12/28/2017   OSA on CPAP    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   Postural dizziness with presyncope 09/23/2017   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/24/2017   Recurrent left pleural effusion 03/14/2015   Renal failure, chronic, stage 3 (moderate) (HCC)    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   Syncope 11/04/2017   Systolic heart failure (HCC)    Thyroid disease    Vitamin D deficiency 04/04/2015    Past Surgical History:  Procedure Laterality Date   BUBBLE STUDY  05/16/2020   Procedure: BUBBLE STUDY;  Surgeon: Parke Poisson, MD;  Location: [facility] ENDOSCOPY;  Service: Cardiovascular;;   CARDIAC CATHETERIZATION     CARDIOVERSION N/A 05/16/2020   Procedure: CARDIOVERSION;  Surgeon: Parke Poisson, MD;  Location: [facility] ENDOSCOPY;  Service: Cardiovascular;  Laterality: N/A;   CARDIOVERSION N/A 09/06/2020   Procedure: CARDIOVERSION;  Surgeon: Little Ishikawa, MD;  Location: [facility] ENDOSCOPY;  Service: Cardiovascular;  Laterality: N/A;   CARDIOVERSION N/A 05/16/2021   Procedure: CARDIOVERSION;  Surgeon: Quintella Reichert, MD;  Location: [facility] ENDOSCOPY;  Service: Cardiovascular;  Laterality: N/A;   CARDIOVERSION N/A 08/12/2022   Procedure: CARDIOVERSION;  Surgeon: Thomasene Ripple, DO;  Location: [facility] INVASIVE CV LAB;  Service: Cardiovascular;  Laterality: N/A;   CARDIOVERSION N/A 09/30/2022   Procedure: CARDIOVERSION;  Surgeon: Jodelle Red, MD;  Location: [facility] INVASIVE CV LAB;  Service: Cardiovascular;  Laterality: N/A;   COLONOSCOPY  11/21/2014   Moderate predominantly sigmoid diverticulosis. Otherwise noraml collonscopy to TI.    CORONARY ANGIOPLASTY WITH STENT PLACEMENT  08/2016   EXCISIONAL HEMORRHOIDECTOMY     LEFT ATRIAL APPENDAGE OCCLUSION  11/2016   in [place]   LOOP RECORDER INSERTION N/A 02/23/2018   Procedure: LOOP RECORDER INSERTION;  Surgeon: Regan Lemming, MD;  Location: [facility] INVASIVE CV LAB;  Service: Cardiovascular;  Laterality: N/A;   LOOP RECORDER REMOVAL N/A 04/24/2019   Procedure: LOOP RECORDER REMOVAL;  Surgeon: Regan Lemming, MD;  Location: [facility] INVASIVE CV LAB;  Service: Cardiovascular;  Laterality: N/A;   NOSE SURGERY     PACEMAKER IMPLANT N/A 04/24/2019   Procedure: PACEMAKER IMPLANT;  Surgeon: Regan Lemming, MD;  Location: [facility] INVASIVE CV LAB;  Service: Cardiovascular;  Laterality: N/A;   TEE WITH CARDIOVERSION     TEE WITHOUT CARDIOVERSION N/A 05/16/2020   Procedure: TRANSESOPHAGEAL ECHOCARDIOGRAM (TEE);  Surgeon: Parke Poisson, MD;  Location: [facility] ENDOSCOPY;  Service: Cardiovascular;  Laterality: N/A;   TEE WITHOUT CARDIOVERSION N/A 08/12/2022   Procedure: TRANSESOPHAGEAL ECHOCARDIOGRAM;  Surgeon: Thomasene Ripple, DO;  Location: [facility] INVASIVE CV LAB;  Service: Cardiovascular;  Laterality: N/A;   TEE WITHOUT CARDIOVERSION N/A 09/30/2022   Procedure: TRANSESOPHAGEAL ECHOCARDIOGRAM;  Surgeon: Jodelle Red, MD;  Location: [facility] INVASIVE CV LAB;  Service: Cardiovascular;  Laterality: N/A;   TOTAL HIP REVISION Left 03/25/2021   Procedure: LEFT POSTERIOR TOTAL HIP  ZIMMER CABLES;  Surgeon: Durene Romans, MD;  Location: [facility] ORS;  Service: Orthopedics;  Laterality: Left;    Current Medications: Current Meds  Medication Sig   amiodarone (PACERONE) 200 MG tablet Take 1 tablet (200 mg total) by mouth 2 (two) times daily.   amLODipine (NORVASC) 5 MG tablet TAKE 1 TABLET BY MOUTH DAILY   apixaban (ELIQUIS) 2.5 MG TABS tablet Take 1 tablet (2.5 mg total) by mouth 2 (two) times daily.  Biotin 1000 MCG tablet Take 1,000 mcg by mouth daily.   blood glucose meter kit and supplies KIT Dispense based on patient and insurance preference. Use up to four times daily as directed. (Patient taking differently: Inject 1 each into the skin as directed. Dispense based on patient and insurance preference. Use up to four times daily as directed.)   carboxymethylcellul-glycerin (REFRESH RELIEVA) 0.5-0.9 % ophthalmic solution Place 1 drop into both eyes 4 (four) times daily as needed for dry eyes. VE   Cholecalciferol (VITAMIN D3) 50 MCG (2000 UT) TABS Take 2,000 Units by mouth daily at 12 noon.   Coenzyme Q10 100 MG capsule Take 100 mg by mouth 2 (two) times daily.   colchicine 0.6 MG tablet Take 0.6 mg by mouth  daily as needed (gout). For gout flare ups   denosumab (PROLIA) 60 MG/ML SOSY injection Inject 60 mg into the skin every 6 (six) months.   docusate sodium (COLACE) 100 MG capsule Take 100 mg by mouth daily.   estradiol (ESTRACE) 0.1 MG/GM vaginal cream Place 1 Applicatorful vaginally every Monday, Wednesday, and Friday.    Evolocumab (REPATHA SURECLICK) 140 MG/ML SOAJ Inject 140 mg into the skin every 14 (fourteen) days.   famotidine (PEPCID) 40 MG tablet Take 40 mg by mouth every evening.   febuxostat (ULORIC) 40 MG tablet Take 40 mg by mouth at bedtime.   feeding supplement, GLUCERNA SHAKE, (GLUCERNA SHAKE) LIQD Take 237 mLs by mouth 3 (three) times daily between meals.   ferrous sulfate 325 (65 FE) MG EC tablet Take 325 mg by mouth 2 (two) times daily with breakfast and lunch.   folic acid (FOLVITE) 800 MCG tablet Take 800 mcg by mouth daily at 12 noon.   Glucosamine-Chondroitin (OSTEO BI-FLEX REGULAR STRENGTH PO) Take 25 mcg by mouth daily at 12 noon. Plus D3   hydrALAZINE (APRESOLINE) 50 MG tablet Take 1 tablet (50 mg total) by mouth every 6 (six) hours.   isosorbide mononitrate (IMDUR) 60 MG 24 hr tablet Take 60 mg by mouth every morning.   Lactobacillus (FLORAJEN ACIDOPHILUS) CAPS Take 1 capsule by mouth daily at 12 noon. 390 mg each   levothyroxine (SYNTHROID, LEVOTHROID) 50 MCG tablet Take 50 mcg by mouth daily before breakfast.   Multiple Vitamins-Minerals (PRESERVISION AREDS 2) CAPS Take 1 capsule by mouth 2 (two) times daily.   nitroGLYCERIN (NITROSTAT) 0.4 MG SL tablet Place 0.4 mg under the tongue every 5 (five) minutes as needed for chest pain.   omega-3 acid ethyl esters (LOVAZA) 1 g capsule Take 2 capsules (2 g total) by mouth daily.   pantoprazole (PROTONIX) 40 MG tablet Take 1 tablet (40 mg total) by mouth daily.   polyethylene glycol powder (GLYCOLAX/MIRALAX) powder Take 17 g by mouth every other day. Alternating between miralax and benefiber   pravastatin (PRAVACHOL) 40 MG  tablet Take 40 mg by mouth at bedtime.   ranolazine (RANEXA) 500 MG 12 hr tablet Take 1 tablet (500 mg total) by mouth 2 (two) times daily.   torsemide (DEMADEX) 10 MG tablet Take 1 tablet (10 mg total) by mouth 2 (two) times daily.   vitamin B-12 (CYANOCOBALAMIN) 1000 MCG tablet Take 1,000 mcg by mouth in the morning and at bedtime.   Wheat Dextrin (BENEFIBER DRINK MIX PO) Take 1 Scoop by mouth every other day. Mix 1 capful with beverage and drink every other day   [DISCONTINUED] albuterol (VENTOLIN HFA) 108 (90 Base) MCG/ACT inhaler Inhale 2 puffs into the lungs every 6 (six)  hours as needed for wheezing or shortness of breath.     Allergies:   Ciprofloxacin, Contrast media [iodinated contrast media], Crestor [rosuvastatin], Lipitor [atorvastatin], Nsaids, and Statins   Social History   Socioeconomic History   Marital status: Married    Spouse name: Not on file   Number of children: 2   Years of education: Not on file   Highest education level: Not on file  Occupational History   Occupation: Retired   Tobacco Use   Smoking status: Former   Smokeless tobacco: Former    Quit date: 1997   Tobacco comments:    Former smoker 05/22/21  Vaping Use   Vaping status: Never Used  Substance and Sexual Activity   Alcohol use: Yes    Alcohol/week: 2.0 standard drinks of alcohol    Types: 2 Standard drinks or equivalent per week    Comment: Occ   Drug use: No   Sexual activity: Not on file  Other Topics Concern   Not on file  Social History Narrative   Watchman device 2018   Social Determinants of Health   Financial Resource Strain: Not on file  Food Insecurity: Not on file  Transportation Needs: Not on file  Physical Activity: Not on file  Stress: Not on file  Social Connections: Not on file     Family History: The patient's family history includes Breast cancer in her mother; Cancer in her maternal grandfather; Diabetes in her sister; Heart attack in her brother, maternal  uncle, and sister; Hypertension in her father; Prostate cancer in her father; Rectal cancer in her maternal uncle; Stroke in her father. There is no history of Esophageal cancer, Pancreatic cancer, or Stomach cancer. ROS:   Please see the history of present illness.    All 14 point review of systems negative except as described per history of present illness  EKGs/Labs/Other Studies Reviewed:         Recent Labs: 01/07/2022: NT-Pro BNP 1,181 07/22/2022: Magnesium 2.1 07/30/2022: ALT 17; TSH 5.330 09/18/2022: BUN 36; Creatinine, Ser 1.95; Hemoglobin 9.6; Platelets 212; Potassium 5.1; Sodium 144  Recent Lipid Panel    Component Value Date/Time   CHOL 125 09/05/2019 0842   TRIG 162 (H) 09/05/2019 0842   HDL 59 09/05/2019 0842   CHOLHDL 2.1 09/05/2019 0842   LDLCALC 39 09/05/2019 0842    Physical Exam:    VS:  BP 130/78 (BP Location: Left Arm, Patient Position: Sitting)   Pulse (!) 103   Ht 5\' 3"  (1.6 m)   Wt 183 lb (83 kg)   SpO2 98%   BMI 32.42 kg/m     Wt Readings from Last 3 Encounters:  10/15/22 183 lb (83 kg)  10/07/22 177 lb (80.3 kg)  09/30/22 179 lb 14.3 oz (81.6 kg)     GEN:  Well nourished, well developed in no acute distress HEENT: Normal NECK: No JVD; No carotid bruits LYMPHATICS: No lymphadenopathy CARDIAC: Irregularly regular holosystolic murmur 2/6 basilar border sternum, no rubs, no gallops RESPIRATORY:  Clear to auscultation without rales, wheezing or rhonchi  ABDOMEN: Soft, non-tender, non-distended MUSCULOSKELETAL:  No edema; No deformity  SKIN: Warm and dry LOWER EXTREMITIES: no swelling NEUROLOGIC:  Alert and oriented x 3 PSYCHIATRIC:  Normal affect   ASSESSMENT:    1. Paroxysmal atrial flutter (HCC)   2. Paroxysmal atrial fibrillation (HCC)   3. Chronic systolic CHF (congestive heart failure) (HCC)   4. Coronary artery disease involving native coronary artery of native heart without angina  pectoris   5. 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 Pacemaker Medtronic device    PLAN:    In order of problems listed above:  Paroxysmal atrial flutter/fibrillation.  She is in abnormal rhythm again today I will reduce dose of amiodarone to 200 mg daily, will continue anticoagulation but I will check her CBC if her CBC showed decrease in H&H then we will stop the medication if her H&H is stable we will continue with anticipation that we may consider another cardioversion if we gave up on the rhythm control strategy then anticoagulation will be withdrawn.  She does have a Watchman device. Mitral regurgitation which appears to be moderate to severe obviously concerning she is a very poor candidate for open surgery to fix it I doubt that she would be candidate for clip.  I will do Chem-7 and proBNP today to see if I can improve degree of leak from the mitral valve with dropping blood pressure a little bit and diuresis. Pacemaker present follow-up by our EP team. Coronary artery disease stable.   Medication Adjustments/Labs and Tests Ordered: Current medicines are reviewed at length with the patient today.  Concerns regarding medicines are outlined above.  Orders Placed This Encounter  Procedures   EKG 12-Lead   Medication changes: No orders of the defined types were placed in this encounter.   Signed, Georgeanna Lea, MD, [facility] 10/15/2022 2:21 PM    [facility]

## 2022-10-15 NOTE — Addendum Note (Signed)
Addended by: Baldo Ash D on: 10/15/2022 02:31 PM   Modules accepted: Orders

## 2022-10-16 ENCOUNTER — Telehealth: Payer: Self-pay

## 2022-10-16 DIAGNOSIS — I5022 Chronic systolic (congestive) heart failure: Secondary | ICD-10-CM

## 2022-10-16 DIAGNOSIS — N184 Chronic kidney disease, stage 4 (severe): Secondary | ICD-10-CM

## 2022-10-16 LAB — BASIC METABOLIC PANEL
BUN/Creatinine Ratio: 15 (ref 12–28)
BUN: 40 mg/dL — ABNORMAL HIGH (ref 8–27)
CO2: 23 mmol/L (ref 20–29)
Calcium: 10 mg/dL (ref 8.7–10.3)
Chloride: 104 mmol/L (ref 96–106)
Creatinine, Ser: 2.65 mg/dL — ABNORMAL HIGH (ref 0.57–1.00)
Glucose: 266 mg/dL — ABNORMAL HIGH (ref 70–99)
Potassium: 4.6 mmol/L (ref 3.5–5.2)
Sodium: 143 mmol/L (ref 134–144)
eGFR: 18 mL/min/{1.73_m2} — ABNORMAL LOW (ref >59)

## 2022-10-16 LAB — CBC
Hematocrit: 30.4 % — ABNORMAL LOW (ref 34.0–46.6)
Hemoglobin: 9.2 g/dL — ABNORMAL LOW (ref 11.1–15.9)
MCH: 30 pg (ref 26.6–33.0)
MCHC: 30.3 g/dL — ABNORMAL LOW (ref 31.5–35.7)
MCV: 99 fL — ABNORMAL HIGH (ref 79–97)
Platelets: 204 10*3/uL (ref 150–450)
RBC: 3.07 x10E6/uL — ABNORMAL LOW (ref 3.77–5.28)
RDW: 14.1 % (ref 11.7–15.4)
WBC: 5.2 10*3/uL (ref 3.4–10.8)

## 2022-10-16 LAB — PRO B NATRIURETIC PEPTIDE: NT-Pro BNP: 4009 pg/mL — ABNORMAL HIGH (ref 0–738)

## 2022-10-16 NOTE — Telephone Encounter (Signed)
-----   Message from Gypsy Balsam sent at 10/16/2022 12:31 PM EDT ----- Anemia slightly worse, stop Eliquis, kidney function slightly worse, lets check Chem-7 next week

## 2022-10-16 NOTE — Telephone Encounter (Signed)
Patient notified of results and recommendations, agreed with plan. Instructed to stop Eliquis per Dr.K(see message below), Aware of labs, instructed to come by Thursday or Friday, she verbalized understanding of the following instructions. I reconciled her med list. Lab order on file.

## 2022-10-19 ENCOUNTER — Ambulatory Visit (INDEPENDENT_AMBULATORY_CARE_PROVIDER_SITE_OTHER): Payer: Medicare Other

## 2022-10-19 DIAGNOSIS — I442 Atrioventricular block, complete: Secondary | ICD-10-CM | POA: Diagnosis not present

## 2022-10-19 LAB — CUP PACEART REMOTE DEVICE CHECK
Battery Remaining Longevity: 138 mo
Battery Voltage: 3.02 V
Brady Statistic AP VP Percent: 0.01 %
Brady Statistic AP VS Percent: 5.03 %
Brady Statistic AS VP Percent: 0.08 %
Brady Statistic AS VS Percent: 94.98 %
Brady Statistic RA Percent Paced: 1.19 %
Brady Statistic RV Percent Paced: 13.67 %
Date Time Interrogation Session: 20240915211702
Implantable Lead Connection Status: 753985
Implantable Lead Connection Status: 753985
Implantable Lead Implant Date: 20210322
Implantable Lead Implant Date: 20210322
Implantable Lead Location: 753859
Implantable Lead Location: 753860
Implantable Lead Model: 5076
Implantable Lead Model: 5076
Implantable Pulse Generator Implant Date: 20210322
Lead Channel Impedance Value: 266 Ohm
Lead Channel Impedance Value: 304 Ohm
Lead Channel Impedance Value: 342 Ohm
Lead Channel Impedance Value: 418 Ohm
Lead Channel Pacing Threshold Amplitude: 0.625 V
Lead Channel Pacing Threshold Amplitude: 0.875 V
Lead Channel Pacing Threshold Pulse Width: 0.4 ms
Lead Channel Pacing Threshold Pulse Width: 0.4 ms
Lead Channel Sensing Intrinsic Amplitude: 4.25 mV
Lead Channel Sensing Intrinsic Amplitude: 4.25 mV
Lead Channel Sensing Intrinsic Amplitude: 7.875 mV
Lead Channel Sensing Intrinsic Amplitude: 7.875 mV
Lead Channel Setting Pacing Amplitude: 1.5 V
Lead Channel Setting Pacing Amplitude: 2.5 V
Lead Channel Setting Pacing Pulse Width: 0.4 ms
Lead Channel Setting Sensing Sensitivity: 2 mV
Zone Setting Status: 755011
Zone Setting Status: 755011

## 2022-10-23 DIAGNOSIS — N184 Chronic kidney disease, stage 4 (severe): Secondary | ICD-10-CM | POA: Diagnosis not present

## 2022-10-23 DIAGNOSIS — I480 Paroxysmal atrial fibrillation: Secondary | ICD-10-CM | POA: Diagnosis not present

## 2022-10-23 DIAGNOSIS — I5022 Chronic systolic (congestive) heart failure: Secondary | ICD-10-CM | POA: Diagnosis not present

## 2022-10-24 LAB — BASIC METABOLIC PANEL
BUN/Creatinine Ratio: 17 (ref 12–28)
BUN: 38 mg/dL — ABNORMAL HIGH (ref 8–27)
CO2: 24 mmol/L (ref 20–29)
Calcium: 9.7 mg/dL (ref 8.7–10.3)
Chloride: 108 mmol/L — ABNORMAL HIGH (ref 96–106)
Creatinine, Ser: 2.22 mg/dL — ABNORMAL HIGH (ref 0.57–1.00)
Glucose: 187 mg/dL — ABNORMAL HIGH (ref 70–99)
Potassium: 4.6 mmol/L (ref 3.5–5.2)
Sodium: 143 mmol/L (ref 134–144)
eGFR: 22 mL/min/{1.73_m2} — ABNORMAL LOW (ref >59)

## 2022-10-24 LAB — HEMOGLOBIN AND HEMATOCRIT, BLOOD
Hematocrit: 29 % — ABNORMAL LOW (ref 34.0–46.6)
Hemoglobin: 8.9 g/dL — ABNORMAL LOW (ref 11.1–15.9)

## 2022-10-29 ENCOUNTER — Telehealth: Payer: Self-pay | Admitting: Nurse Practitioner

## 2022-10-29 ENCOUNTER — Telehealth: Payer: Self-pay

## 2022-10-29 DIAGNOSIS — D631 Anemia in chronic kidney disease: Secondary | ICD-10-CM

## 2022-10-29 NOTE — Telephone Encounter (Signed)
Amanda [name], our office does not need to duplicate her labs. Patient to proceed with CBC per cardiology tomorrow and can contact her office if her hemoglobin level has dropped.  Thank you

## 2022-10-29 NOTE — Telephone Encounter (Signed)
Spoke with patient & made her aware that labs do not need to be duplicated. No further questions.

## 2022-10-29 NOTE — Telephone Encounter (Signed)
Spoke with pt regarding lab results. Advised per Dr. Vanetta Shawl note to have labs this week for H&H. Pt agreed and reported she had been exposed to Covid but has no symptoms. Encouraged to wear a mask in public for now and to see her PCP if she develops symptoms.

## 2022-10-29 NOTE — Telephone Encounter (Signed)
Inbound call from patient would like to know if lab orders can be sent to Dr. Vanetta Shawl office in [place] due to being close in location. Please advise.

## 2022-10-30 DIAGNOSIS — D631 Anemia in chronic kidney disease: Secondary | ICD-10-CM | POA: Diagnosis not present

## 2022-10-30 DIAGNOSIS — N184 Chronic kidney disease, stage 4 (severe): Secondary | ICD-10-CM | POA: Diagnosis not present

## 2022-10-30 NOTE — Addendum Note (Signed)
Addended by: Baldo Ash D on: 10/30/2022 10:37 AM   Modules accepted: Orders

## 2022-10-31 LAB — HEMOGLOBIN AND HEMATOCRIT, BLOOD
Hematocrit: 31.9 % — ABNORMAL LOW (ref 34.0–46.6)
Hemoglobin: 9.8 g/dL — ABNORMAL LOW (ref 11.1–15.9)

## 2022-11-02 NOTE — Progress Notes (Signed)
Remote pacemaker transmission.   

## 2022-11-06 DIAGNOSIS — E785 Hyperlipidemia, unspecified: Secondary | ICD-10-CM | POA: Diagnosis not present

## 2022-11-06 DIAGNOSIS — I482 Chronic atrial fibrillation, unspecified: Secondary | ICD-10-CM | POA: Diagnosis not present

## 2022-11-06 DIAGNOSIS — Z79899 Other long term (current) drug therapy: Secondary | ICD-10-CM | POA: Diagnosis not present

## 2022-11-06 DIAGNOSIS — E559 Vitamin D deficiency, unspecified: Secondary | ICD-10-CM | POA: Diagnosis not present

## 2022-11-06 DIAGNOSIS — D500 Iron deficiency anemia secondary to blood loss (chronic): Secondary | ICD-10-CM | POA: Diagnosis not present

## 2022-11-06 DIAGNOSIS — Z23 Encounter for immunization: Secondary | ICD-10-CM | POA: Diagnosis not present

## 2022-11-06 DIAGNOSIS — E1122 Type 2 diabetes mellitus with diabetic chronic kidney disease: Secondary | ICD-10-CM | POA: Diagnosis not present

## 2022-11-06 DIAGNOSIS — E039 Hypothyroidism, unspecified: Secondary | ICD-10-CM | POA: Diagnosis not present

## 2022-11-06 DIAGNOSIS — N184 Chronic kidney disease, stage 4 (severe): Secondary | ICD-10-CM | POA: Diagnosis not present

## 2022-11-06 DIAGNOSIS — Z0000 Encounter for general adult medical examination without abnormal findings: Secondary | ICD-10-CM | POA: Diagnosis not present

## 2022-11-06 LAB — LAB REPORT - SCANNED
A1c: 6.8
Albumin, Urine POC: 419.4
Creatinine, POC: 50.1 mg/dL
EGFR: 24
Free T4: 1.24 ng/dL
Microalb Creat Ratio: 837
TSH: 4.76 (ref 0.41–5.90)

## 2022-11-12 DIAGNOSIS — E113213 Type 2 diabetes mellitus with mild nonproliferative diabetic retinopathy with macular edema, bilateral: Secondary | ICD-10-CM | POA: Diagnosis not present

## 2022-11-12 DIAGNOSIS — H43813 Vitreous degeneration, bilateral: Secondary | ICD-10-CM | POA: Diagnosis not present

## 2022-11-12 DIAGNOSIS — H353231 Exudative age-related macular degeneration, bilateral, with active choroidal neovascularization: Secondary | ICD-10-CM | POA: Diagnosis not present

## 2022-11-16 ENCOUNTER — Encounter: Payer: Self-pay | Admitting: *Deleted

## 2022-11-16 ENCOUNTER — Ambulatory Visit: Payer: Medicare Other | Attending: Cardiology | Admitting: Cardiology

## 2022-11-16 ENCOUNTER — Encounter: Payer: Self-pay | Admitting: Cardiology

## 2022-11-16 VITALS — BP 132/70 | HR 86 | Ht 63.0 in | Wt 181.1 lb

## 2022-11-16 DIAGNOSIS — G4733 Obstructive sleep apnea (adult) (pediatric): Secondary | ICD-10-CM | POA: Diagnosis not present

## 2022-11-16 DIAGNOSIS — D6869 Other thrombophilia: Secondary | ICD-10-CM | POA: Diagnosis not present

## 2022-11-16 DIAGNOSIS — I4819 Other persistent atrial fibrillation: Secondary | ICD-10-CM

## 2022-11-16 DIAGNOSIS — I442 Atrioventricular block, complete: Secondary | ICD-10-CM

## 2022-11-16 DIAGNOSIS — I10 Essential (primary) hypertension: Secondary | ICD-10-CM | POA: Diagnosis not present

## 2022-11-16 DIAGNOSIS — Z01812 Encounter for preprocedural laboratory examination: Secondary | ICD-10-CM

## 2022-11-16 LAB — CUP PACEART INCLINIC DEVICE CHECK
Date Time Interrogation Session: 20241014165653
Implantable Lead Connection Status: 753985
Implantable Lead Connection Status: 753985
Implantable Lead Implant Date: 20210322
Implantable Lead Implant Date: 20210322
Implantable Lead Location: 753859
Implantable Lead Location: 753860
Implantable Lead Model: 5076
Implantable Lead Model: 5076
Implantable Pulse Generator Implant Date: 20210322

## 2022-11-16 MED ORDER — PREDNISONE 50 MG PO TABS: ORAL_TABLET | ORAL | 0 refills | Status: DC

## 2022-11-16 MED ORDER — METOPROLOL TARTRATE 100 MG PO TABS: ORAL_TABLET | ORAL | 0 refills | Status: DC

## 2022-11-16 NOTE — Patient Instructions (Addendum)
Medication Instructions:  Your physician recommends that you continue on your current medications as directed. Please refer to the Current Medication list given to you today.  *If you need a refill on your cardiac medications before your next appointment, please call your pharmacy*   Lab Work: Pre procedure labs in [place], stop by there this week, you do NOT need to be fasting:  BMP & CBC  If you have a lab test that is abnormal and we need to change your treatment, we will call you to review the results -- otherwise no news is good news.    Testing/Procedures: Your physician has requested that you have cardiac CT 1 month PRIOR to your ablation. Cardiac computed tomography (CT) is a painless test that uses an x-ray machine to take clear, detailed pictures of your heart.  Please follow instruction below located under "other instructions". You are scheduled for 10/25, please see instruction letter given to you today.  Your physician has recommended that you have an ablation. Catheter ablation is a medical procedure used to treat some cardiac arrhythmias (irregular heartbeats). During catheter ablation, a long, thin, flexible tube is put into a blood vessel in your groin (upper thigh), or neck. This tube is called an ablation catheter. It is then guided to your heart through the blood vessel. Radio frequency waves destroy small areas of heart tissue where abnormal heartbeats may cause an arrhythmia to start.   Please see the instruction letter given to you today.   Follow-Up: At [facility], you and your health needs are our priority.  As part of our continuing mission to provide you with exceptional heart care, we have created designated Provider Care Teams.  These Care Teams include your primary Cardiologist (physician) and Advanced Practice Providers (APPs -  Physician Assistants and Nurse Practitioners) who all work together to provide you with the care you need, when you need it.  Your  next appointment:   1 month(s) after your ablation  The format for your next appointment:   In Person  Provider:   AFib clinic   Thank you for choosing [facility]!!   Dory Horn, RN [phone]    Other Instructions   Cardiac Ablation Cardiac ablation is a procedure to destroy (ablate) some heart tissue that is sending bad signals. These bad signals cause problems in heart rhythm. The heart has many areas that make these signals. If there are problems in these areas, they can make the heart beat in a way that is not normal. Destroying some tissues can help make the heart rhythm normal. Tell your doctor about: Any allergies you have. All medicines you are taking. These include vitamins, herbs, eye drops, creams, and over-the-counter medicines. Any problems you or family members have had with medicines that make you fall asleep (anesthetics). Any blood disorders you have. Any surgeries you have had. Any medical conditions you have, such as kidney failure. Whether you are pregnant or may be pregnant. What are the risks? This is a safe procedure. But problems may occur, including: Infection. Bruising and bleeding. Bleeding into the chest. Stroke or blood clots. Damage to nearby areas of your body. Allergies to medicines or dyes. The need for a pacemaker if the normal system is damaged. Failure of the procedure to treat the problem. What happens before the procedure? Medicines Ask your doctor about: Changing or stopping your normal medicines. This is important. Taking aspirin and ibuprofen. Do not take these medicines unless your doctor tells you to  take them. Taking other medicines, vitamins, herbs, and supplements. General instructions Follow instructions from your doctor about what you cannot eat or drink. Plan to have someone take you home from the hospital or clinic. If you will be going home right after the procedure, plan to have someone with you for 24  hours. Ask your doctor what steps will be taken to prevent infection. What happens during the procedure?  An IV tube will be put into one of your veins. You will be given a medicine to help you relax. The skin on your neck or groin will be numbed. A cut (incision) will be made in your neck or groin. A needle will be put through your cut and into a large vein. A tube (catheter) will be put into the needle. The tube will be moved to your heart. Dye may be put through the tube. This helps your doctor see your heart. Small devices (electrodes) on the tube will send out signals. A type of energy will be used to destroy some heart tissue. The tube will be taken out. Pressure will be held on your cut. This helps stop bleeding. A bandage will be put over your cut. The exact procedure may vary among doctors and hospitals. What happens after the procedure? You will be watched until you leave the hospital or clinic. This includes checking your heart rate, breathing rate, oxygen, and blood pressure. Your cut will be watched for bleeding. You will need to lie still for a few hours. Do not drive for 24 hours or as long as your doctor tells you. Summary Cardiac ablation is a procedure to destroy some heart tissue. This is done to treat heart rhythm problems. Tell your doctor about any medical conditions you may have. Tell him or her about all medicines you are taking to treat them. This is a safe procedure. But problems may occur. These include infection, bruising, bleeding, and damage to nearby areas of your body. Follow what your doctor tells you about food and drink. You may also be told to change or stop some of your medicines. After the procedure, do not drive for 24 hours or as long as your doctor tells you. This information is not intended to replace advice given to you by your health care provider. Make sure you discuss any questions you have with your health care provider. Document Revised:  04/11/2021 Document Reviewed: 12/22/2018 Elsevier Patient Education  2023 [employer]   Cardiac Ablation, Care After  This sheet gives you information about how to care for yourself after your procedure. Your health care provider may also give you more specific instructions. If you have problems or questions, contact your health care provider. What can I expect after the procedure? After the procedure, it is common to have: Bruising around your puncture site. Tenderness around your puncture site. Skipped heartbeats. If you had an atrial fibrillation ablation, you may have atrial fibrillation during the first several months after your procedure.  Tiredness (fatigue).  Follow these instructions at home: Puncture site care  Follow instructions from your health care provider about how to take care of your puncture site. Make sure you: If present, leave stitches (sutures), skin glue, or adhesive strips in place. These skin closures may need to stay in place for up to 2 weeks. If adhesive strip edges start to loosen and curl up, you may trim the loose edges. Do not remove adhesive strips completely unless your health care provider tells you to do that.  If a large square bandage is present, this may be removed 24 hours after surgery.  Check your puncture site every day for signs of infection. Check for: Redness, swelling, or pain. Fluid or blood. If your puncture site starts to bleed, lie down on your back, apply firm pressure to the area, and contact your health care provider. Warmth. Pus or a bad smell. A pea or small marble sized lump at the site is normal and can take up to three months to resolve.  Driving Do not drive for at least 4 days after your procedure or however long your health care provider recommends. (Do not resume driving if you have previously been instructed not to drive for other health reasons.) Do not drive or use heavy machinery while taking prescription pain  medicine. Activity Avoid activities that take a lot of effort for at least 7 days after your procedure. Do not lift anything that is heavier than 5 lb (4.5 kg) for one week.  No sexual activity for 1 week.  Return to your normal activities as told by your health care provider. Ask your health care provider what activities are safe for you. General instructions Take over-the-counter and prescription medicines only as told by your health care provider. Do not use any products that contain nicotine or tobacco, such as cigarettes and e-cigarettes. If you need help quitting, ask your health care provider. You may shower after 24 hours, but Do not take baths, swim, or use a hot tub for 1 week.  Do not drink alcohol for 24 hours after your procedure. Keep all follow-up visits as told by your health care provider. This is important. Contact a health care provider if: You have redness, mild swelling, or pain around your puncture site. You have fluid or blood coming from your puncture site that stops after applying firm pressure to the area. Your puncture site feels warm to the touch. You have pus or a bad smell coming from your puncture site. You have a fever. You have chest pain or discomfort that spreads to your neck, jaw, or arm. You have chest pain that is worse with lying on your back or taking a deep breath. You are sweating a lot. You feel nauseous. You have a fast or irregular heartbeat. You have shortness of breath. You are dizzy or light-headed and feel the need to lie down. You have pain or numbness in the arm or leg closest to your puncture site. Get help right away if: Your puncture site suddenly swells. Your puncture site is bleeding and the bleeding does not stop after applying firm pressure to the area. These symptoms may represent a serious problem that is an emergency. Do not wait to see if the symptoms will go away. Get medical help right away. Call your local emergency  services (911 in the [place] Do not drive yourself to the hospital. Summary After the procedure, it is normal to have bruising and tenderness at the puncture site in your groin, neck, or forearm. Check your puncture site every day for signs of infection. Get help right away if your puncture site is bleeding and the bleeding does not stop after applying firm pressure to the area. This is a medical emergency. This information is not intended to replace advice given to you by your health care provider. Make sure you discuss any questions you have with your health care provider.

## 2022-11-16 NOTE — Addendum Note (Signed)
Addended by: Baird Lyons on: 11/16/2022 05:52 PM   Modules accepted: Orders

## 2022-11-16 NOTE — Progress Notes (Signed)
Electrophysiology Office Note:   Date:  11/16/2022  ID:  Amanda [name], DOB Jun 02, 1940, MRN [id number]  Primary Cardiologist: Gypsy Balsam, MD Electrophysiologist: Regan Lemming, MD      History of Present Illness:   Amanda [name] is a 82 y.o. female with h/o ischemic stroke, atrial fibrillation, CKD stage III, diabetes, hypertension, OSA on CPAP, watchman, syncope with intermittent heart block seen today for routine electrophysiology followup.   Since last being seen in our clinic the patient reports increasing shortness of breath and fatigue.  She is unfortunately back in atrial fibrillation.  She did have a cardioversion, but this only lasted 4 days.  She feels quite short of breath and fatigue.  She is using a cane due to weakness in her legs which was not there when she was in normal rhythm.  she denies chest pain, palpitations, dyspnea, PND, orthopnea, nausea, vomiting, dizziness, syncope, edema, weight gain, or early satiety.   Review of systems complete and found to be negative unless listed in HPI.      EP Information / Studies Reviewed:    EKG is ordered today. Personal review as below.  EKG Interpretation Date/Time:  Monday November 16 2022 16:04:54 EDT Ventricular Rate:  86 PR Interval:    QRS Duration:  104 QT Interval:  374 QTC Calculation: 447 R Axis:   184  Text Interpretation: Atrial flutter with variable A-V block with premature ventricular or aberrantly conducted complexes Right superior axis deviation When compared with ECG of 15-Oct-2022 14:04, No significant change since last tracing Confirmed by Micahel Omlor ([id number]) on 11/16/2022 4:25:08 PM   PPM Interrogation-  reviewed in detail today,  See PACEART report.  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 The patient's score is based  upon: CHF History: 1 HTN History: 1 Diabetes History: 1 Stroke History: 2 Vascular Disease History: 1 Age Score: 2 Gender Score: 1             Physical Exam:   VS:  BP 132/70   Pulse 86   Ht 5\' 3"  (1.6 m)   Wt 181 lb 1.3 oz (82.1 kg)   SpO2 91%   BMI 32.08 kg/m    Wt Readings from Last 3 Encounters:  11/16/22 181 lb 1.3 oz (82.1 kg)  10/15/22 183 lb (83 kg)  10/07/22 177 lb (80.3 kg)     GEN: Well nourished, well developed in no acute distress NECK: No JVD; No carotid bruits CARDIAC: Irregularly irregular rate and rhythm, no murmurs, rubs, gallops RESPIRATORY:  Clear to auscultation without rales, wheezing or rhonchi  ABDOMEN: Soft, non-tender, non-distended EXTREMITIES:  No edema; No deformity   ASSESSMENT AND PLAN:    Intermittent heart block  s/p Medtronic PPM  Normal PPM function See Pace Art report No changes today  2.  Persistent atrial fibrillation/flutter: Post watchman and thus not anticoagulated.  Currently on amiodarone.  Post recent cardioversion 09/30/2022.  Unfortunately, she is back in atrial fibrillation with significant fatigue and shortness of breath.  She would prefer a rhythm control strategy and thus we Ainsleigh Kakos plan for ablation.  Risks and benefits have been discussed.  She understands the risks and is agreed to the procedure.  Risk, benefits, and alternatives to EP study and radiofrequency/pulse field ablation for afib were also discussed in detail today. These risks include but are not  limited to stroke, bleeding, vascular damage, tamponade, perforation, damage to the esophagus, lungs, and other structures, pulmonary vein stenosis, worsening renal function, and death. The patient understands these risk and wishes to proceed.  We Anala Whisenant therefore proceed with catheter ablation at the next available time.  Carto, ICE, anesthesia are requested for the procedure.  Martin Smeal also obtain CT PV protocol prior to the procedure to exclude LAA thrombus and further  evaluate atrial anatomy.  3.  Secondary hypercoagulable state: Post watchman for atrial fibrillation  4.  Hypertension: Currently well-controlled  5.  Obstructive sleep apnea: CPAP compliance encouraged  Disposition:   Follow up with Dr. Elberta Fortis as usual post procedure  Signed, Findlay Dagher Jorja Loa, MD

## 2022-11-17 ENCOUNTER — Encounter: Payer: Self-pay | Admitting: Cardiology

## 2022-11-19 DIAGNOSIS — Z01812 Encounter for preprocedural laboratory examination: Secondary | ICD-10-CM | POA: Diagnosis not present

## 2022-11-19 DIAGNOSIS — I4819 Other persistent atrial fibrillation: Secondary | ICD-10-CM | POA: Diagnosis not present

## 2022-11-19 LAB — CBC
Hematocrit: 31.6 % — ABNORMAL LOW (ref 34.0–46.6)
Hemoglobin: 10 g/dL — ABNORMAL LOW (ref 11.1–15.9)
MCH: 28.8 pg (ref 26.6–33.0)
MCHC: 31.6 g/dL (ref 31.5–35.7)
MCV: 91 fL (ref 79–97)
Platelets: 300 10*3/uL (ref 150–450)
RBC: 3.47 x10E6/uL — ABNORMAL LOW (ref 3.77–5.28)
RDW: 15.7 % — ABNORMAL HIGH (ref 11.7–15.4)
WBC: 6.2 10*3/uL (ref 3.4–10.8)

## 2022-11-19 LAB — BASIC METABOLIC PANEL
BUN/Creatinine Ratio: 18 (ref 12–28)
BUN: 39 mg/dL — ABNORMAL HIGH (ref 8–27)
CO2: 23 mmol/L (ref 20–29)
Calcium: 10.4 mg/dL — ABNORMAL HIGH (ref 8.7–10.3)
Chloride: 105 mmol/L (ref 96–106)
Creatinine, Ser: 2.22 mg/dL — ABNORMAL HIGH (ref 0.57–1.00)
Glucose: 152 mg/dL — ABNORMAL HIGH (ref 70–99)
Potassium: 4.4 mmol/L (ref 3.5–5.2)
Sodium: 140 mmol/L (ref 134–144)
eGFR: 22 mL/min/{1.73_m2} — ABNORMAL LOW (ref >59)

## 2022-11-19 NOTE — Addendum Note (Signed)
Addended by: Baird Lyons on: 11/19/2022 11:37 AM   Modules accepted: Orders

## 2022-11-19 NOTE — Telephone Encounter (Signed)
Spoke to pt. Question answered. However, pt aware that CT will be cancelled d/t kidney fxn/GFR.  Aware I will not cancel it until pre procedure blood work comes back (I changed it to STAT today and pt going by [facility] office now for it). She has not been taking her Eliquis as Dr. Bing Matter told her to stop it last month d/t anemia. She also reports that she is in afib. Aware I will discuss things with Dr. Elberta Fortis once blood work comes in and will let her know plan by tomorrow. Patient verbalized understanding and agreeable to plan.

## 2022-11-20 NOTE — Telephone Encounter (Signed)
Pt aware that Hgb improved, but informed that we are concerned w/ recent positive hemocult in July, stopping OAC and numbers keep improving. Pt r/s for possible ablation on 12/16, and will call Dr. Chales Abrahams next week to discuss and schedule OV for further evaluation.

## 2022-11-23 ENCOUNTER — Telehealth: Payer: Self-pay | Admitting: Nurse Practitioner

## 2022-11-23 NOTE — Telephone Encounter (Signed)
Inbound call from patient, states her cardiologist advised her she would need to schedule an endoscopy prior to her ablation. Patient advised soonest appointment was in Jan. Patient would like to be seen sooner.

## 2022-11-23 NOTE — Telephone Encounter (Signed)
Pt stated that her Cardiologist is requesting her to have an endoscopy prior to her ablation. Chart reviewed. Pt was scheduled to see Dr. Chales Abrahams on 11/30/2022 at 11:00 AM. Pt made aware.  Pt verbalized understanding with all questions answered.

## 2022-11-27 ENCOUNTER — Ambulatory Visit (HOSPITAL_COMMUNITY): Payer: Medicare Other

## 2022-11-30 ENCOUNTER — Other Ambulatory Visit (INDEPENDENT_AMBULATORY_CARE_PROVIDER_SITE_OTHER): Payer: Medicare Other

## 2022-11-30 ENCOUNTER — Ambulatory Visit: Payer: Medicare Other | Admitting: Gastroenterology

## 2022-11-30 ENCOUNTER — Encounter: Payer: Self-pay | Admitting: Gastroenterology

## 2022-11-30 ENCOUNTER — Telehealth: Payer: Self-pay | Admitting: *Deleted

## 2022-11-30 VITALS — BP 134/86 | HR 106 | Ht 63.0 in | Wt 185.0 lb

## 2022-11-30 DIAGNOSIS — R195 Other fecal abnormalities: Secondary | ICD-10-CM | POA: Diagnosis not present

## 2022-11-30 DIAGNOSIS — R932 Abnormal findings on diagnostic imaging of liver and biliary tract: Secondary | ICD-10-CM

## 2022-11-30 LAB — COMPREHENSIVE METABOLIC PANEL
ALT: 33 U/L (ref 0–35)
AST: 32 U/L (ref 0–37)
Albumin: 3.3 g/dL — ABNORMAL LOW (ref 3.5–5.2)
Alkaline Phosphatase: 104 U/L (ref 39–117)
BUN: 48 mg/dL — ABNORMAL HIGH (ref 6–23)
CO2: 21 meq/L (ref 19–32)
Calcium: 9.8 mg/dL (ref 8.4–10.5)
Chloride: 105 meq/L (ref 96–112)
Creatinine, Ser: 2.37 mg/dL — ABNORMAL HIGH (ref 0.40–1.20)
GFR: 18.71 mL/min — ABNORMAL LOW (ref >60.00)
Glucose, Bld: 211 mg/dL — ABNORMAL HIGH (ref 70–99)
Potassium: 4.5 meq/L (ref 3.5–5.1)
Sodium: 136 meq/L (ref 135–145)
Total Bilirubin: 0.4 mg/dL (ref 0.2–1.2)
Total Protein: 6.4 g/dL (ref 6.0–8.3)

## 2022-11-30 LAB — CBC WITH DIFFERENTIAL/PLATELET
Basophils Absolute: 0 10*3/uL (ref 0.0–0.1)
Basophils Relative: 0.5 % (ref 0.0–3.0)
Eosinophils Absolute: 0.4 10*3/uL (ref 0.0–0.7)
Eosinophils Relative: 5.4 % — ABNORMAL HIGH (ref 0.0–5.0)
HCT: 26.3 % — ABNORMAL LOW (ref 36.0–46.0)
Hemoglobin: 8.3 g/dL — ABNORMAL LOW (ref 12.0–15.0)
Lymphocytes Relative: 5.2 % — ABNORMAL LOW (ref 12.0–46.0)
Lymphs Abs: 0.4 10*3/uL — ABNORMAL LOW (ref 0.7–4.0)
MCHC: 31.6 g/dL (ref 30.0–36.0)
MCV: 89.7 fL (ref 78.0–100.0)
Monocytes Absolute: 0.7 10*3/uL (ref 0.1–1.0)
Monocytes Relative: 10.1 % (ref 3.0–12.0)
Neutro Abs: 5.6 10*3/uL (ref 1.4–7.7)
Neutrophils Relative %: 78.8 % — ABNORMAL HIGH (ref 43.0–77.0)
Platelets: 314 10*3/uL (ref 150.0–400.0)
RBC: 2.93 Mil/uL — ABNORMAL LOW (ref 3.87–5.11)
RDW: 16.9 % — ABNORMAL HIGH (ref 11.5–15.5)
WBC: 7.1 10*3/uL (ref 4.0–10.5)

## 2022-11-30 LAB — PROTIME-INR
INR: 1.2 {ratio} — ABNORMAL HIGH (ref 0.8–1.0)
Prothrombin Time: 12.8 s (ref 9.6–13.1)

## 2022-11-30 NOTE — Progress Notes (Signed)
08/26/2022 Amanda [name] [id number] 04-08-1940     CHIEF COMPLAINT: Stool test showed blood in stool   HISTORY OF PRESENT ILLNESS: Amanda [name] is an 82 year old female with a past medical history of hypertension, hyperlipidemia, coronary artery disease s/p STEMI/cardiogenic shock and RCA DES 08/2016, CHF, ischemic cardiomyopathy, atrial fibrillation s/p watchmans procedure 2018 off Eliquis, moderate to severe MR, syncope s/p dual-chamber pacemaker 04/2019, CVA 10/2016, DM type II, CKD stage III/IV, chronic anemia, OSA (does not use CPAP), GERD and ischemic colitis. She is known by Dr. Chales Abrahams.    She presents to our office today as recommended by her cardiologist Dr. Bing Matter evaluation regarding a recent positive FOBT in setting of chronic anemia and TEE ECHO showed 2 clots in the right atrium. Anticoagulation not initiated, awaiting GI recommendations. She denies having any nausea or vomiting. No heartburn. She has intermittent dysphagia, food briefly gets stuck to the upper esophagus once or twice weekly x 6 months. Last episode occurred one week ago, she ate potatoes which briefly got stuck in her upper esophagus and passed after she drank a few sips of water. No upper abdominal pain. She remains on Pantoprazole 40 mg daily and Famotidine 40 mg at bedtime. She has mild RLQ discomfort which occurs prior to passing a BM and abates after defecation completed. She is passing a normal dark brown formed stool daily.  Stools are darker since she previously started Ferrous Sulfate daily.  No rectal bleeding. She is no longer on Eliquis. However, she has recurrent A-fib with RVR and was Amiodarone dose was recently increased.  She underwent a TEE  08/12/2018 (prior to consideration for cardioversion) which identified 2 highly mobile thrombus in the right atrium, moderate to severe MR and LVEF 55 to 60%.   She has a significant cardiac history including STEMI with cardiogenic shock s/p DES to the RCA  08/2016.  She also had a GI bleed during this hospitalization with profound anemia.  Admission hemoglobin level 5.9 with positive FOBT on Xarelto for A-fib.  She received 4 units of PRBCs. She underwent  an EGD 08/12/2016 by Dr. Rocky Crafts which showed a small hiatal hernia, mild gastritis. Colonoscopy 08/12/2016 showed moderate pancolonic diverticulosis, small ascending colonic tubular adenoma, ischemic colitis and mod internal hemorrhoids.  Etiology for her GI bleed was not identified.  Hemoglobin was 8.3 at time of discharge.   She underwent a small bowel capsule endoscopy 10/28/2017 which showed a small gastric polyp, poorly visualized lesion in the proximal small bowel with erythema, friability 35 minutes and small erosions at 1 hour and 15 minutes.  She underwent an EGD by Dr. Chales Abrahams 12/03/2017 which showed a small hiatal hernia otherwise was normal.   She has CKD stage III/IV followed by nephrologist at [facility].  She last saw her nephrologist Dr. Lequita Halt on 08/04/2022.  At that time, her creatinine level was 2.1 with a GFR 23.  Hemoglobin downward at 9.2 despite oral iron supplementation.  She received EPO 20,000 units x 1.  She received IV iron infusions in the past, not recently.         Latest Ref Rng & Units 08/18/2022   10:55 AM 07/30/2022    1:23 PM 07/22/2022   11:49 AM  CBC  WBC 3.4 - 10.8 x10E3/uL 4.3  6.2  6.9   Hemoglobin 11.1 - 15.9 g/dL 9.8  9.2  9.1   Hematocrit 34.0 - 46.6 % 32.4  29.4  29.3  Platelets 150 - 450 x10E3/uL 234  326  272           Latest Ref Rng & Units 07/30/2022    1:23 PM 07/22/2022   11:49 AM 06/01/2022    4:18 PM  CMP  Glucose 70 - 99 mg/dL 782  956     BUN 8 - 27 mg/dL 45  38     Creatinine 2.13 - 1.00 mg/dL 0.86  5.78     Sodium 469 - 144 mmol/L 142  145     Potassium 3.5 - 5.2 mmol/L 4.7  4.3     Chloride 96 - 106 mmol/L 105  111     CO2 20 - 29 mmol/L 23  19     Calcium 8.7 - 10.3 mg/dL 62.9  52.8     Total Protein 6.0 - 8.5 g/dL 6.5    6.1    Total Bilirubin 0.0 - 1.2 mg/dL <4.1    0.3   Alkaline Phos 44 - 121 IU/L 88    86   AST 0 - 40 IU/L 15    18   ALT 0 - 32 IU/L 17    13     ECHO 08/12/2022: IMPRESSIONS Left ventricular ejection fraction, by estimation, is 55 to 60%. The left ventricle has normal function. Left ventricular diastolic function could not be evaluated. 1. 2. Right ventricular systolic function is normal. The right ventricular size is normal. Watchman device well seated in the left atrial appendage. No clots. No noted residual flow/leak noted on color doppler. No left atrial/left atrial appendage thrombus was detected. 3. Device lead noted in the right atrium. There are two highly mobile echogenic material off the device lead consistent with a clot/thrombus. 4. The mitral valve is abnormal. Moderate to severe mitral valve regurgitation. No evidence of mitral stenosis. 5. 6. The aortic valve is calcified. Aortic valve regurgitation is not visualized.   EGD 08/12/2016 by Dr. Rocky Crafts done during hospitalization with STEMI and GI bleed: Small hiatal hernia, mild gastritis.     Colonoscopy 08/12/2016 done during hospitalization with STEMI and GI bleed: Moderate pancolonic diverticulosis, small ascending colonic tubular adenoma, ischemic colitis and mod internal hemorrhoids. .   EGD 12/03/2017 by Dr. Chales Abrahams: - Small hiatal hernia.  - Normal examined duodenum.  - No specimens collected.   Small bowel capsule endoscopy 10/28/2017: Complete capsule study, fairly good prep Small gastric polyp Poorly visualized lesion in proximal small bowel with erythema, friability at 35 minutes  Small erosions at 1 hour 50 minutes   11/21/2014 by Dr. Leona Singleton: Moderate predominantly sigmoid diverticulosis Otherwise normal colonoscopy to terminal ileum       Past Medical History:  Diagnosis Date   Acute ischemic stroke (HCC)     Acute metabolic encephalopathy     Acute on chronic systolic (congestive) heart failure (HCC)     Acute renal  insufficiency 09/02/2016   Anemia due to stage 4 chronic kidney disease (HCC) 04/04/2015   Atrial fibrillation (HCC)     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) 04/04/2015   Bradycardia 09/17/2014   Chest pain 06/23/2016    Overview:  Added automatically from request for surgery [id number]   Chronic anemia     Chronic kidney disease (CKD), stage III (moderate) (HCC)     Coronary artery disease 10/12/2016    Non drug-eluting stent implanted in June 2018 to mid RCA   08/08/2016 10:37  Angiographic Findings  Cardiac Arteries and Lesion Findings  LMCA: 0% and Normal. LAD: 0% and Normal. RCA:   Lesion on Mid RCA: Mid subsection.95% stenosis reduced to 0%. Pre procedure   TIMI II flow was noted. Post Procedure TIMI III flow was present. Poor run   off was present. The lesion was diagnosed as High Risk (C).    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) 04/04/2015   Diabetes mellitus without complication (HCC)     Diverticulosis     Dyslipidemia 09/17/2014   Gastroesophageal reflux     GI bleed 08/06/2016   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   History of cardiac monitoring 02/23/2018    monitor inserted   History of iron deficiency anemia 12/28/2017   Hyperlipidemia     Hypertensive heart disease with heart failure (HCC)     Iron deficiency anemia 12/28/2017   LV dysfunction 09/02/2016   Myocardial infarction [facility])     NSTEMI (non-ST elevated myocardial infarction) (HCC) 08/06/2016   Obstructive sleep apnea 09/17/2014   Orthostatic hypotension 12/28/2017   OSA on CPAP     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   Postural dizziness with presyncope 09/23/2017   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/24/2017   Recurrent left pleural effusion 03/14/2015   Renal failure, chronic, stage 3 (moderate) (HCC)     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   Syncope 11/04/2017   Systolic heart failure (HCC)     Thyroid  disease     Vitamin D deficiency 04/04/2015             Past Surgical History:  Procedure Laterality Date   BUBBLE STUDY   05/16/2020    Procedure: BUBBLE STUDY;  Surgeon: Parke Poisson, MD;  Location: [facility] ENDOSCOPY;  Service: Cardiovascular;;   CARDIAC CATHETERIZATION       CARDIOVERSION N/A 05/16/2020    Procedure: CARDIOVERSION;  Surgeon: Parke Poisson, MD;  Location: [facility] ENDOSCOPY;  Service: Cardiovascular;  Laterality: N/A;   CARDIOVERSION N/A 09/06/2020    Procedure: CARDIOVERSION;  Surgeon: Little Ishikawa, MD;  Location: [facility] ENDOSCOPY;  Service: Cardiovascular;  Laterality: N/A;   CARDIOVERSION N/A 05/16/2021    Procedure: CARDIOVERSION;  Surgeon: Quintella Reichert, MD;  Location: [facility] ENDOSCOPY;  Service: Cardiovascular;  Laterality: N/A;   CARDIOVERSION N/A 08/12/2022    Procedure: CARDIOVERSION;  Surgeon: Thomasene Ripple, DO;  Location: [facility] INVASIVE CV LAB;  Service: Cardiovascular;  Laterality: N/A;   COLONOSCOPY   11/21/2014    Moderate predominantly sigmoid diverticulosis. Otherwise noraml collonscopy to TI.    CORONARY ANGIOPLASTY WITH STENT PLACEMENT   08/2016   EXCISIONAL HEMORRHOIDECTOMY       LEFT ATRIAL APPENDAGE OCCLUSION   11/2016    in [place]   LOOP RECORDER INSERTION N/A 02/23/2018    Procedure: LOOP RECORDER INSERTION;  Surgeon: Regan Lemming, MD;  Location: [facility] INVASIVE CV LAB;  Service: Cardiovascular;  Laterality: N/A;   LOOP RECORDER REMOVAL N/A 04/24/2019    Procedure: LOOP RECORDER REMOVAL;  Surgeon: Regan Lemming, MD;  Location: [facility] INVASIVE CV LAB;  Service: Cardiovascular;  Laterality: N/A;   NOSE SURGERY       PACEMAKER IMPLANT N/A 04/24/2019    Procedure: PACEMAKER IMPLANT;  Surgeon: Regan Lemming, MD;  Location: [facility] INVASIVE CV LAB;  Service: Cardiovascular;  Laterality: N/A;   TEE WITHOUT CARDIOVERSION N/A 05/16/2020    Procedure: TRANSESOPHAGEAL ECHOCARDIOGRAM (TEE);  Surgeon: Parke Poisson, MD;  Location: [facility] ENDOSCOPY;   Service: Cardiovascular;  Laterality: N/A;   TEE WITHOUT CARDIOVERSION N/A 08/12/2022    Procedure: TRANSESOPHAGEAL ECHOCARDIOGRAM;  Surgeon: Thomasene Ripple, DO;  Location: [facility] INVASIVE CV LAB;  Service: Cardiovascular;  Laterality: N/A;   TOTAL HIP REVISION Left 03/25/2021    Procedure: LEFT POSTERIOR TOTAL HIP  ZIMMER CABLES;  Surgeon: Durene Romans, MD;  Location: [facility] ORS;  Service: Orthopedics;  Laterality: Left;        Social History: She is married.  She has 2 daughters.  Retired.  Past smoker, quit smoking cigarettes 27 years ago.  She drinks 2 alcoholic beverages weekly.  No drug use.   Family History: family history includes Breast cancer in her mother; Cancer in her maternal grandfather; Diabetes in her sister; Heart attack in her brother, maternal uncle, and sister; Hypertension in her father; Prostate cancer in her father; Stroke in her father.No known family history of esophageal, gastric or colon cancer     Allergies       Allergies  Allergen Reactions   Ciprofloxacin Other (See Comments)      Achilles tendon pain   Contrast Media [Iodinated Contrast Media]        Avoid due to stage 4 kidney disease    Crestor [Rosuvastatin]        Joint pain   Lipitor [Atorvastatin]        Joint pain   Nsaids        Avoid due to stage 4 kidney disease    Statins Other (See Comments)      Joint pain, tolerates pravastatin             Outpatient Encounter Medications as of 08/26/2022  Medication Sig   albuterol (VENTOLIN HFA) 108 (90 Base) MCG/ACT inhaler Inhale 2 puffs into the lungs every 6 (six) hours as needed for wheezing or shortness of breath.   amiodarone (PACERONE) 200 MG tablet Take 1 tablet (200 mg total) by mouth 2 (two) times daily.   amLODipine (NORVASC) 5 MG tablet TAKE 1 TABLET BY MOUTH DAILY   BIOTIN PO Take 1,000 mcg by mouth daily.   blood glucose meter kit and supplies KIT Dispense based on patient and insurance preference. Use up to four times daily as directed.  (Patient taking differently: Inject 1 each into the skin as directed. Dispense based on patient and insurance preference. Use up to four times daily as directed.)   carboxymethylcellul-glycerin (REFRESH RELIEVA) 0.5-0.9 % ophthalmic solution Place 1 drop into both eyes daily as needed for dry eyes. VE   Cholecalciferol (VITAMIN D3) 50 MCG (2000 UT) TABS Take 2,000 Units by mouth daily at 12 noon.   Coenzyme Q10 100 MG capsule Take 100 mg by mouth 2 (two) times daily.   colchicine 0.6 MG tablet Take 0.6 mg by mouth daily as needed (gout). For gout flare ups   denosumab (PROLIA) 60 MG/ML SOSY injection Inject 60 mg into the skin every 6 (six) months.   docusate sodium (COLACE) 100 MG capsule Take 100 mg by mouth daily.   estradiol (ESTRACE) 0.1 MG/GM vaginal cream Place 1 Applicatorful vaginally every Monday, Wednesday, and Friday.    Evolocumab (REPATHA SURECLICK) 140 MG/ML SOAJ Inject 140 mg into the skin every 14 (fourteen) days.   famotidine (PEPCID) 40 MG tablet Take 40 mg by mouth every evening.   febuxostat (ULORIC) 40 MG tablet Take 40 mg by mouth at bedtime.   feeding supplement, GLUCERNA SHAKE, (GLUCERNA SHAKE) LIQD Take 237 mLs by mouth 3 (three) times daily between meals. (  Patient taking differently: Take 237 mLs by mouth 2 (two) times daily between meals.)   ferrous sulfate 325 (65 FE) MG EC tablet Take 325 mg by mouth 2 (two) times daily with breakfast and lunch.   folic acid (FOLVITE) 800 MCG tablet Take 800 mcg by mouth daily at 12 noon.   Glucosamine-Chondroitin (OSTEO BI-FLEX REGULAR STRENGTH PO) Take 25 mcg by mouth daily at 12 noon. Plus D3   hydrALAZINE (APRESOLINE) 50 MG tablet Take 1 tablet (50 mg total) by mouth every 6 (six) hours.   isosorbide mononitrate (IMDUR) 60 MG 24 hr tablet Take 60 mg by mouth daily.   Lactobacillus (FLORAJEN ACIDOPHILUS) CAPS Take 1 capsule by mouth daily at 12 noon. 390 mg each   levothyroxine (SYNTHROID, LEVOTHROID) 50 MCG tablet Take 50 mcg by  mouth daily before breakfast.   Multiple Vitamins-Minerals (PRESERVISION AREDS 2) CAPS Take 1 capsule by mouth 2 (two) times daily.   nitroGLYCERIN (NITROSTAT) 0.4 MG SL tablet Place 0.4 mg under the tongue every 5 (five) minutes as needed for chest pain.   omega-3 acid ethyl esters (LOVAZA) 1 g capsule Take 2 capsules (2 g total) by mouth daily.   pantoprazole (PROTONIX) 40 MG tablet Take 1 tablet (40 mg total) by mouth daily.   polyethylene glycol powder (GLYCOLAX/MIRALAX) powder Take 17 g by mouth every other day. Alternating between miralax and benefiber   pravastatin (PRAVACHOL) 40 MG tablet Take 40 mg by mouth at bedtime.   ranolazine (RANEXA) 500 MG 12 hr tablet Take 1 tablet (500 mg total) by mouth 2 (two) times daily.   torsemide (DEMADEX) 10 MG tablet Take 1 tablet (10 mg total) by mouth 2 (two) times daily.   vitamin B-12 (CYANOCOBALAMIN) 1000 MCG tablet Take 1,000 mcg by mouth daily at 12 noon.   Wheat Dextrin (BENEFIBER DRINK MIX PO) Take 1 Scoop by mouth every other day. Mix 1 capful with beverage and drink every other day      No facility-administered encounter medications on file as of 08/26/2022.        REVIEW OF SYSTEMS:  Gen: Denies fever, sweats or chills. No weight loss.  CV: Denies chest pain, palpitations or edema. Resp: Denies cough, shortness of breath of hemoptysis.  GI:See HPI. GU : Denies urinary burning, blood in urine, increased urinary frequency or incontinence. MS: Denies joint pain, muscles aches or weakness. Derm: Denies rash, itchiness, skin lesions or unhealing ulcers. Psych: Denies depression, anxiety, memory loss or confusion. Heme: Denies bruising, easy bleeding. Neuro:  Denies headaches, dizziness or paresthesias. Endo:  + DM type II.   PHYSICAL EXAM: BP (!) 148/72   Pulse 66   Ht 5\' 3"  (1.6 m)   Wt 182 lb (82.6 kg)   SpO2 99%   BMI 32.24 kg/m    General: 82 year old female in no acute distress. Head: Normocephalic and atraumatic. Eyes:   Sclerae non-icteric, conjunctive pink. Ears: Normal auditory acuity. Mouth: Dentition intact. No ulcers or lesions.  Neck: Supple, no lymphadenopathy or thyromegaly.  Lungs: Clear bilaterally to auscultation without wheezes, crackles or rhonchi. Heart: Regular rate and rhythm. No murmur, rub or gallop appreciated.  Abdomen: Soft, nontender, nondistended. No masses. No hepatosplenomegaly. Normoactive bowel sounds x 4 quadrants.  Rectal: Deferred.  Musculoskeletal: Symmetrical with no gross deformities. Skin: Warm and dry. No rash or lesions on visible extremities. Extremities: Bilateral LE edema, LLE > RLE with negative Homan's sign. Patient  Neurological: Alert oriented x 4, no focal deficits.  Psychological:  Alert and cooperative. Normal mood and affect.   ASSESSMENT AND PLAN:   82 year old female with chronic iron anemia with + FOBT. Hg 9.8. No obvious rectal bleeding or melena. History of GI 08/2016.  EGD 08/12/2016 showed a small hiatal hernia, mild gastritis. Colonoscopy 08/12/2016 showed moderate pancolonic diverticulosis, small ascending colonic tubular adenoma, ischemic colitis and mod internal hemorrhoids. Small bowel capsule endoscopy 10/28/2017 showed a small gastric polyp, poorly visualized lesion in the proximal small bowel with erythema, friability 35 minutes and small erosions at 1 hour and 15 minutes. She EGD 12/03/2017 which showed a small hiatal hernia otherwise was normal.  -Patient is very high risk for sedation/procedure complications. Endoscopic evaluation deferred for now.  -CTAP with oral contrast only asap -Await repeat H/H results  -Case reviewed by Dr. Chales Abrahams, ok for cardiology to start anticoagulation for right atrial thrombus  -If patient develops active GI bleeding, patient to present to [facility] ED for admission   CKD stage III/IV contributing to anemia. Received EPO x 1 per nephrology.    History of GERD. Dysphagia x 6 months. -Barium swallow study, to  be scheduled one week after CTAP completed  -Endoscopic evaluation deferred for now    Atrial fibrillation, not on anticoagulation.    TEE showed 2 clots in the right atrium.  -Patient to follow up with cardiology -See GI recommendations and noted above    Bilateral LE edema, LLE > RLE. Patient endorsed falling/injuring her left knee 3 months ago, since then LLE edema > RLE -Follow up with cardiologist, recommend LLE doppler to rule out DVT   History of GI a tubular adenomatous polyp removed from the colon per colonoscopy 08/2016   History of ischemic colitis per colonoscopy 08/2016       CC:  Street, Stephanie Coup, *     Attending physician's note   I have taken history, reviewed the chart and examined the patient. I performed a substantive portion of this encounter, including complete performance of at least one of the key components, in conjunction with the APP. I agree with the Advanced Practitioner's note, impression and recommendations.   No more clots right atrium after eliquis. Stopped Eliquis per cardiology  Cardioversion done but not successful Ablation schedulted scheduled for 01/18/2023 Cardiology would like to have EGD done prior for heme positive stools to rule out any peptic ulcer disease or any etiology of upper GI bleeding.  Hb 10.0 10/17//24  No NSAIDs  CTAP: 09/08/2022 IMPRESSION: 1. No acute abnormality. No evidence of bowel obstruction or acute bowel inflammation. Marked sigmoid diverticulosis, with no evidence of acute diverticulitis. 2. Moderate diffuse colonic stool, suggesting constipation. 3. Small posterior right pleural effusion. 4. Coronary atherosclerosis. 5. Small hiatal hernia. 6. Mildly diffusely dense liver parenchyma, nonspecific, correlate for history of amiodarone therapy. 7.  Aortic Atherosclerosis (ICD10-I70.0).  Her LFTs were normal.   IMP: H+ stools. Abn CT liver with normal LFTs as above (pt on amio) A-fib s/p watchman's  procedure of Eliquis  Plan: -EGD at [facility] (dec 2) per cardiology. Nevertherless, lets gets cardio clearence. -Continue protonix 40mg  po every day -CBC, CMP, INR,  -USE (pt on amiodarone)   Edman Circle, MD [facility] [phone]

## 2022-11-30 NOTE — Telephone Encounter (Signed)
[facility] Pre-operative Risk Assessment     Request for surgical clearance:     Endoscopy Procedure  What type of surgery is being performed?     EGD  When is this surgery scheduled?     01/04/23  What type of clearance is required ?   Cardiac   Practice name and name of physician performing surgery?       Gastroenterology  What is your office phone and fax number?      Phone- [phone]  Fax- [phone]  Anesthesia type (None, local, MAC, general) ?       MAC

## 2022-11-30 NOTE — Telephone Encounter (Signed)
Name: Amanda [name]  DOB: 1940/11/23  MRN: [id number]  Primary Cardiologist: Gypsy Balsam, MD  Chart reviewed as part of pre-operative protocol coverage. The patient has an upcoming visit scheduled with Dr. Bing Matter on 12/18/2022 at which time clearance can be addressed in case there are any issues that would impact surgical recommendations.   I added preop FYI to appointment note so that provider is aware to address at time of outpatient visit.  Per office protocol the cardiology provider should forward their finalized clearance decision and recommendations regarding antiplatelet therapy to the requesting party below.      I will route this message as FYI to requesting party and remove this message from the preop box as separate preop APP input not needed at this time.   Please call with any questions.  Napoleon Form, Leodis Rains, NP  11/30/2022, 11:59 AM

## 2022-11-30 NOTE — Patient Instructions (Signed)
Your provider has requested that you go to the basement level for lab work before leaving today. Press "B" on the elevator. The lab is located at the first door on the left as you exit the elevator.  Continue Protonix 40 mg daily.   You have been scheduled for an endoscopy. Please follow written instructions given to you at your visit today.  If you use inhalers (even only as needed), please bring them with you on the day of your procedure.  If you take any of the following medications, they will need to be adjusted prior to your procedure:   DO NOT TAKE 7 DAYS PRIOR TO TEST- Trulicity (dulaglutide) Ozempic, Wegovy (semaglutide) Mounjaro (tirzepatide) Bydureon Bcise (exanatide extended release)  DO NOT TAKE 1 DAY PRIOR TO YOUR TEST Rybelsus (semaglutide) Adlyxin (lixisenatide) Victoza (liraglutide) Byetta (exanatide) __________________________________________________________________  _______________________________________________________  If your blood pressure at your visit was 140/90 or greater, please contact your primary care physician to follow up on this.  _______________________________________________________  If you are age 27 or older, your body mass index should be between 23-30. Your Body mass index is 32.77 kg/m. If this is out of the aforementioned range listed, please consider follow up with your Primary Care Provider.  If you are age 13 or younger, your body mass index should be between 19-25. Your Body mass index is 32.77 kg/m. If this is out of the aformentioned range listed, please consider follow up with your Primary Care Provider.   ________________________________________________________  The [facility] providers would like to encourage you to use [facility] to communicate with providers for non-urgent requests or questions.  Due to long hold times on the telephone, sending your provider a message by [facility] may be a faster and more efficient way to get a  response.  Please allow 48 business hours for a response.  Please remember that this is for non-urgent requests.  _______________________________________________________

## 2022-12-02 ENCOUNTER — Other Ambulatory Visit (INDEPENDENT_AMBULATORY_CARE_PROVIDER_SITE_OTHER): Payer: Medicare Other

## 2022-12-02 ENCOUNTER — Other Ambulatory Visit: Payer: Self-pay

## 2022-12-02 DIAGNOSIS — R195 Other fecal abnormalities: Secondary | ICD-10-CM

## 2022-12-02 LAB — CBC WITH DIFFERENTIAL/PLATELET
Basophils Absolute: 0 10*3/uL (ref 0.0–0.1)
Basophils Relative: 0.3 % (ref 0.0–3.0)
Eosinophils Absolute: 0.6 10*3/uL (ref 0.0–0.7)
Eosinophils Relative: 7.5 % — ABNORMAL HIGH (ref 0.0–5.0)
HCT: 30.1 % — ABNORMAL LOW (ref 36.0–46.0)
Hemoglobin: 9.4 g/dL — ABNORMAL LOW (ref 12.0–15.0)
Lymphocytes Relative: 5.4 % — ABNORMAL LOW (ref 12.0–46.0)
Lymphs Abs: 0.5 10*3/uL — ABNORMAL LOW (ref 0.7–4.0)
MCHC: 31.3 g/dL (ref 30.0–36.0)
MCV: 89.2 fL (ref 78.0–100.0)
Monocytes Absolute: 0.7 10*3/uL (ref 0.1–1.0)
Monocytes Relative: 8.7 % (ref 3.0–12.0)
Neutro Abs: 6.6 10*3/uL (ref 1.4–7.7)
Neutrophils Relative %: 78.1 % — ABNORMAL HIGH (ref 43.0–77.0)
Platelets: 394 10*3/uL (ref 150.0–400.0)
RBC: 3.37 Mil/uL — ABNORMAL LOW (ref 3.87–5.11)
RDW: 17.6 % — ABNORMAL HIGH (ref 11.5–15.5)
WBC: 8.5 10*3/uL (ref 4.0–10.5)

## 2022-12-06 ENCOUNTER — Emergency Department (HOSPITAL_COMMUNITY): Payer: Medicare Other

## 2022-12-06 ENCOUNTER — Inpatient Hospital Stay (HOSPITAL_COMMUNITY)
Admission: EM | Admit: 2022-12-06 | Discharge: 2022-12-15 | DRG: 308 | Disposition: A | Discharge status: Home Health | Payer: Medicare Other | Attending: Internal Medicine | Admitting: Internal Medicine

## 2022-12-06 ENCOUNTER — Other Ambulatory Visit: Payer: Self-pay

## 2022-12-06 ENCOUNTER — Encounter (HOSPITAL_COMMUNITY): Payer: Self-pay

## 2022-12-06 DIAGNOSIS — D631 Anemia in chronic kidney disease: Secondary | ICD-10-CM | POA: Diagnosis present

## 2022-12-06 DIAGNOSIS — J810 Acute pulmonary edema: Principal | ICD-10-CM

## 2022-12-06 DIAGNOSIS — Z888 Allergy status to other drugs, medicaments and biological substances status: Secondary | ICD-10-CM

## 2022-12-06 DIAGNOSIS — I4892 Unspecified atrial flutter: Secondary | ICD-10-CM | POA: Diagnosis not present

## 2022-12-06 DIAGNOSIS — I4891 Unspecified atrial fibrillation: Secondary | ICD-10-CM

## 2022-12-06 DIAGNOSIS — I509 Heart failure, unspecified: Secondary | ICD-10-CM | POA: Diagnosis not present

## 2022-12-06 DIAGNOSIS — G9389 Other specified disorders of brain: Secondary | ICD-10-CM | POA: Diagnosis not present

## 2022-12-06 DIAGNOSIS — E039 Hypothyroidism, unspecified: Secondary | ICD-10-CM | POA: Diagnosis not present

## 2022-12-06 DIAGNOSIS — Z881 Allergy status to other antibiotic agents status: Secondary | ICD-10-CM

## 2022-12-06 DIAGNOSIS — E1165 Type 2 diabetes mellitus with hyperglycemia: Secondary | ICD-10-CM | POA: Diagnosis not present

## 2022-12-06 DIAGNOSIS — I130 Hypertensive heart and chronic kidney disease with heart failure and stage 1 through stage 4 chronic kidney disease, or unspecified chronic kidney disease: Secondary | ICD-10-CM | POA: Diagnosis not present

## 2022-12-06 DIAGNOSIS — Z950 Presence of cardiac pacemaker: Secondary | ICD-10-CM | POA: Diagnosis not present

## 2022-12-06 DIAGNOSIS — Z8042 Family history of malignant neoplasm of prostate: Secondary | ICD-10-CM

## 2022-12-06 DIAGNOSIS — Z87891 Personal history of nicotine dependence: Secondary | ICD-10-CM

## 2022-12-06 DIAGNOSIS — D509 Iron deficiency anemia, unspecified: Secondary | ICD-10-CM | POA: Diagnosis present

## 2022-12-06 DIAGNOSIS — J9621 Acute and chronic respiratory failure with hypoxia: Secondary | ICD-10-CM | POA: Diagnosis present

## 2022-12-06 DIAGNOSIS — G4733 Obstructive sleep apnea (adult) (pediatric): Secondary | ICD-10-CM | POA: Diagnosis present

## 2022-12-06 DIAGNOSIS — Z8249 Family history of ischemic heart disease and other diseases of the circulatory system: Secondary | ICD-10-CM

## 2022-12-06 DIAGNOSIS — J90 Pleural effusion, not elsewhere classified: Secondary | ICD-10-CM | POA: Diagnosis not present

## 2022-12-06 DIAGNOSIS — I517 Cardiomegaly: Secondary | ICD-10-CM | POA: Diagnosis not present

## 2022-12-06 DIAGNOSIS — A419 Sepsis, unspecified organism: Secondary | ICD-10-CM | POA: Diagnosis not present

## 2022-12-06 DIAGNOSIS — I2510 Atherosclerotic heart disease of native coronary artery without angina pectoris: Secondary | ICD-10-CM | POA: Diagnosis present

## 2022-12-06 DIAGNOSIS — Z79890 Hormone replacement therapy (postmenopausal): Secondary | ICD-10-CM | POA: Diagnosis not present

## 2022-12-06 DIAGNOSIS — I255 Ischemic cardiomyopathy: Secondary | ICD-10-CM | POA: Diagnosis not present

## 2022-12-06 DIAGNOSIS — Z91041 Radiographic dye allergy status: Secondary | ICD-10-CM

## 2022-12-06 DIAGNOSIS — I252 Old myocardial infarction: Secondary | ICD-10-CM

## 2022-12-06 DIAGNOSIS — E1122 Type 2 diabetes mellitus with diabetic chronic kidney disease: Secondary | ICD-10-CM | POA: Diagnosis present

## 2022-12-06 DIAGNOSIS — Z96642 Presence of left artificial hip joint: Secondary | ICD-10-CM | POA: Diagnosis present

## 2022-12-06 DIAGNOSIS — I499 Cardiac arrhythmia, unspecified: Secondary | ICD-10-CM | POA: Diagnosis not present

## 2022-12-06 DIAGNOSIS — M109 Gout, unspecified: Secondary | ICD-10-CM | POA: Diagnosis not present

## 2022-12-06 DIAGNOSIS — Z7901 Long term (current) use of anticoagulants: Secondary | ICD-10-CM

## 2022-12-06 DIAGNOSIS — D6869 Other thrombophilia: Secondary | ICD-10-CM | POA: Diagnosis present

## 2022-12-06 DIAGNOSIS — K5900 Constipation, unspecified: Secondary | ICD-10-CM | POA: Diagnosis present

## 2022-12-06 DIAGNOSIS — Z800 Family history of malignant neoplasm of digestive organs: Secondary | ICD-10-CM

## 2022-12-06 DIAGNOSIS — N184 Chronic kidney disease, stage 4 (severe): Secondary | ICD-10-CM | POA: Diagnosis present

## 2022-12-06 DIAGNOSIS — R0902 Hypoxemia: Secondary | ICD-10-CM | POA: Diagnosis not present

## 2022-12-06 DIAGNOSIS — E875 Hyperkalemia: Secondary | ICD-10-CM | POA: Diagnosis not present

## 2022-12-06 DIAGNOSIS — Z79899 Other long term (current) drug therapy: Secondary | ICD-10-CM | POA: Diagnosis not present

## 2022-12-06 DIAGNOSIS — I498 Other specified cardiac arrhythmias: Secondary | ICD-10-CM | POA: Diagnosis not present

## 2022-12-06 DIAGNOSIS — M810 Age-related osteoporosis without current pathological fracture: Secondary | ICD-10-CM | POA: Diagnosis present

## 2022-12-06 DIAGNOSIS — R739 Hyperglycemia, unspecified: Secondary | ICD-10-CM | POA: Diagnosis not present

## 2022-12-06 DIAGNOSIS — I5033 Acute on chronic diastolic (congestive) heart failure: Secondary | ICD-10-CM | POA: Diagnosis present

## 2022-12-06 DIAGNOSIS — N179 Acute kidney failure, unspecified: Secondary | ICD-10-CM | POA: Diagnosis not present

## 2022-12-06 DIAGNOSIS — Z833 Family history of diabetes mellitus: Secondary | ICD-10-CM

## 2022-12-06 DIAGNOSIS — I5083 High output heart failure: Secondary | ICD-10-CM | POA: Diagnosis not present

## 2022-12-06 DIAGNOSIS — I110 Hypertensive heart disease with heart failure: Secondary | ICD-10-CM | POA: Diagnosis not present

## 2022-12-06 DIAGNOSIS — R8271 Bacteriuria: Secondary | ICD-10-CM | POA: Diagnosis not present

## 2022-12-06 DIAGNOSIS — M793 Panniculitis, unspecified: Secondary | ICD-10-CM | POA: Diagnosis present

## 2022-12-06 DIAGNOSIS — E785 Hyperlipidemia, unspecified: Secondary | ICD-10-CM | POA: Diagnosis present

## 2022-12-06 DIAGNOSIS — I5030 Unspecified diastolic (congestive) heart failure: Secondary | ICD-10-CM | POA: Insufficient documentation

## 2022-12-06 DIAGNOSIS — I4819 Other persistent atrial fibrillation: Principal | ICD-10-CM | POA: Diagnosis present

## 2022-12-06 DIAGNOSIS — Z955 Presence of coronary angioplasty implant and graft: Secondary | ICD-10-CM

## 2022-12-06 DIAGNOSIS — I081 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: Secondary | ICD-10-CM | POA: Diagnosis present

## 2022-12-06 DIAGNOSIS — Z823 Family history of stroke: Secondary | ICD-10-CM

## 2022-12-06 DIAGNOSIS — M79661 Pain in right lower leg: Secondary | ICD-10-CM | POA: Diagnosis not present

## 2022-12-06 DIAGNOSIS — I493 Ventricular premature depolarization: Secondary | ICD-10-CM | POA: Diagnosis present

## 2022-12-06 DIAGNOSIS — K219 Gastro-esophageal reflux disease without esophagitis: Secondary | ICD-10-CM | POA: Diagnosis present

## 2022-12-06 DIAGNOSIS — Z803 Family history of malignant neoplasm of breast: Secondary | ICD-10-CM

## 2022-12-06 DIAGNOSIS — M79662 Pain in left lower leg: Secondary | ICD-10-CM | POA: Diagnosis not present

## 2022-12-06 DIAGNOSIS — Z8673 Personal history of transient ischemic attack (TIA), and cerebral infarction without residual deficits: Secondary | ICD-10-CM

## 2022-12-06 HISTORY — DX: Unspecified atrial fibrillation: I48.91

## 2022-12-06 LAB — COMPREHENSIVE METABOLIC PANEL
ALT: 29 U/L (ref 0–44)
AST: 39 U/L (ref 15–41)
Albumin: 2.3 g/dL — ABNORMAL LOW (ref 3.5–5.0)
Alkaline Phosphatase: 119 U/L (ref 38–126)
Anion gap: 11 (ref 5–15)
BUN: 46 mg/dL — ABNORMAL HIGH (ref 8–23)
CO2: 19 mmol/L — ABNORMAL LOW (ref 22–32)
Calcium: 10.5 mg/dL — ABNORMAL HIGH (ref 8.9–10.3)
Chloride: 108 mmol/L (ref 98–111)
Creatinine, Ser: 2.17 mg/dL — ABNORMAL HIGH (ref 0.44–1.00)
GFR, Estimated: 22 mL/min — ABNORMAL LOW (ref >60)
Glucose, Bld: 230 mg/dL — ABNORMAL HIGH (ref 70–99)
Potassium: 5.2 mmol/L — ABNORMAL HIGH (ref 3.5–5.1)
Sodium: 138 mmol/L (ref 135–145)
Total Bilirubin: 1.1 mg/dL (ref 0.3–1.2)
Total Protein: 6.2 g/dL — ABNORMAL LOW (ref 6.5–8.1)

## 2022-12-06 LAB — URINALYSIS, W/ REFLEX TO CULTURE (INFECTION SUSPECTED)
Bilirubin Urine: NEGATIVE
Glucose, UA: NEGATIVE mg/dL
Ketones, ur: NEGATIVE mg/dL
Leukocytes,Ua: NEGATIVE
Nitrite: NEGATIVE
Protein, ur: 100 mg/dL — AB
Specific Gravity, Urine: 1.025 (ref 1.005–1.030)
pH: 5.5 (ref 5.0–8.0)

## 2022-12-06 LAB — CBC WITH DIFFERENTIAL/PLATELET
Abs Immature Granulocytes: 0.07 10*3/uL (ref 0.00–0.07)
Basophils Absolute: 0 10*3/uL (ref 0.0–0.1)
Basophils Relative: 0 %
Eosinophils Absolute: 0.5 10*3/uL (ref 0.0–0.5)
Eosinophils Relative: 5 %
HCT: 28.1 % — ABNORMAL LOW (ref 36.0–46.0)
Hemoglobin: 8.4 g/dL — ABNORMAL LOW (ref 12.0–15.0)
Immature Granulocytes: 1 %
Lymphocytes Relative: 4 %
Lymphs Abs: 0.3 10*3/uL — ABNORMAL LOW (ref 0.7–4.0)
MCH: 27.5 pg (ref 26.0–34.0)
MCHC: 29.9 g/dL — ABNORMAL LOW (ref 30.0–36.0)
MCV: 91.8 fL (ref 80.0–100.0)
Monocytes Absolute: 0.7 10*3/uL (ref 0.1–1.0)
Monocytes Relative: 8 %
Neutro Abs: 7.1 10*3/uL (ref 1.7–7.7)
Neutrophils Relative %: 82 %
Platelets: 374 10*3/uL (ref 150–400)
RBC: 3.06 MIL/uL — ABNORMAL LOW (ref 3.87–5.11)
RDW: 16.6 % — ABNORMAL HIGH (ref 11.5–15.5)
WBC: 8.6 10*3/uL (ref 4.0–10.5)
nRBC: 0.5 % — ABNORMAL HIGH (ref 0.0–0.2)

## 2022-12-06 LAB — TROPONIN I (HIGH SENSITIVITY): Troponin I (High Sensitivity): 41 ng/L — ABNORMAL HIGH (ref <18)

## 2022-12-06 LAB — RESP PANEL BY RT-PCR (RSV, FLU A&B, COVID)  RVPGX2
Influenza A by PCR: NEGATIVE
Influenza B by PCR: NEGATIVE
Resp Syncytial Virus by PCR: NEGATIVE
SARS Coronavirus 2 by RT PCR: NEGATIVE

## 2022-12-06 LAB — HEMOGLOBIN A1C
Hgb A1c MFr Bld: 7.7 % — ABNORMAL HIGH (ref 4.8–5.6)
Mean Plasma Glucose: 174.29 mg/dL

## 2022-12-06 LAB — BRAIN NATRIURETIC PEPTIDE: B Natriuretic Peptide: 1011 pg/mL — ABNORMAL HIGH (ref 0.0–100.0)

## 2022-12-06 LAB — APTT: aPTT: 29 s (ref 24–36)

## 2022-12-06 LAB — PROTIME-INR
INR: 1 (ref 0.8–1.2)
Prothrombin Time: 13.4 s (ref 11.4–15.2)

## 2022-12-06 LAB — I-STAT CG4 LACTIC ACID, ED: Lactic Acid, Venous: 0.6 mmol/L (ref 0.5–1.9)

## 2022-12-06 LAB — T4, FREE: Free T4: 1.33 ng/dL — ABNORMAL HIGH (ref 0.61–1.12)

## 2022-12-06 LAB — TSH: TSH: 2.51 u[IU]/mL (ref 0.350–4.500)

## 2022-12-06 MED ORDER — GLUCERNA SHAKE PO LIQD
237.0000 mL | Freq: Three times a day (TID) | ORAL | Status: DC
  Administered 2022-12-07 – 2022-12-15 (×18): 237 mL via ORAL
  Filled 2022-12-06: qty 237

## 2022-12-06 MED ORDER — DILTIAZEM HCL 25 MG/5ML IV SOLN
15.0000 mg | Freq: Once | INTRAVENOUS | Status: AC
  Administered 2022-12-06: 15 mg via INTRAVENOUS
  Filled 2022-12-06: qty 5

## 2022-12-06 MED ORDER — VITAMIN D 25 MCG (1000 UNIT) PO TABS
2000.0000 [IU] | ORAL_TABLET | Freq: Every day | ORAL | Status: DC
  Administered 2022-12-07 – 2022-12-15 (×9): 2000 [IU] via ORAL
  Filled 2022-12-06 (×9): qty 2

## 2022-12-06 MED ORDER — FERROUS SULFATE 325 (65 FE) MG PO TABS
325.0000 mg | ORAL_TABLET | Freq: Every day | ORAL | Status: DC
  Administered 2022-12-07 – 2022-12-15 (×9): 325 mg via ORAL
  Filled 2022-12-06 (×9): qty 1

## 2022-12-06 MED ORDER — AMIODARONE HCL 200 MG PO TABS
200.0000 mg | ORAL_TABLET | Freq: Every day | ORAL | Status: DC
  Administered 2022-12-07: 200 mg via ORAL
  Filled 2022-12-06: qty 1

## 2022-12-06 MED ORDER — AMLODIPINE BESYLATE 5 MG PO TABS
5.0000 mg | ORAL_TABLET | Freq: Every day | ORAL | Status: DC
  Administered 2022-12-07 – 2022-12-15 (×9): 5 mg via ORAL
  Filled 2022-12-06 (×4): qty 1
  Filled 2022-12-06: qty 2
  Filled 2022-12-06 (×4): qty 1

## 2022-12-06 MED ORDER — OMEGA-3-ACID ETHYL ESTERS 1 G PO CAPS
2.0000 | ORAL_CAPSULE | Freq: Every day | ORAL | Status: DC
  Administered 2022-12-06 – 2022-12-15 (×10): 2 g via ORAL
  Filled 2022-12-06 (×12): qty 2

## 2022-12-06 MED ORDER — ESTRADIOL 0.1 MG/GM VA CREA
1.0000 | TOPICAL_CREAM | VAGINAL | Status: DC
  Administered 2022-12-07: 1 via VAGINAL
  Filled 2022-12-06 (×2): qty 42.5

## 2022-12-06 MED ORDER — FAMOTIDINE 20 MG PO TABS
10.0000 mg | ORAL_TABLET | Freq: Every day | ORAL | Status: DC
  Administered 2022-12-06 – 2022-12-15 (×10): 10 mg via ORAL
  Filled 2022-12-06 (×10): qty 1

## 2022-12-06 MED ORDER — PRAVASTATIN SODIUM 40 MG PO TABS
40.0000 mg | ORAL_TABLET | Freq: Every day | ORAL | Status: DC
  Administered 2022-12-06 – 2022-12-14 (×9): 40 mg via ORAL
  Filled 2022-12-06 (×9): qty 1

## 2022-12-06 MED ORDER — ISOSORBIDE MONONITRATE ER 60 MG PO TB24
60.0000 mg | ORAL_TABLET | Freq: Every morning | ORAL | Status: DC
  Administered 2022-12-07 – 2022-12-15 (×9): 60 mg via ORAL
  Filled 2022-12-06: qty 2
  Filled 2022-12-06 (×8): qty 1

## 2022-12-06 MED ORDER — LEVOTHYROXINE SODIUM 50 MCG PO TABS
50.0000 ug | ORAL_TABLET | Freq: Every day | ORAL | Status: DC
  Administered 2022-12-07 – 2022-12-15 (×9): 50 ug via ORAL
  Filled 2022-12-06 (×9): qty 1

## 2022-12-06 MED ORDER — FUROSEMIDE 10 MG/ML IJ SOLN
40.0000 mg | Freq: Once | INTRAMUSCULAR | Status: AC
Start: 2022-12-06 — End: 2022-12-06
  Administered 2022-12-06: 40 mg via INTRAVENOUS
  Filled 2022-12-06: qty 4

## 2022-12-06 MED ORDER — INSULIN ASPART 100 UNIT/ML IJ SOLN
0.0000 [IU] | Freq: Three times a day (TID) | INTRAMUSCULAR | Status: DC
  Administered 2022-12-07 (×2): 2 [IU] via SUBCUTANEOUS
  Administered 2022-12-07: 3 [IU] via SUBCUTANEOUS
  Administered 2022-12-08 (×3): 2 [IU] via SUBCUTANEOUS
  Administered 2022-12-09: 1 [IU] via SUBCUTANEOUS
  Administered 2022-12-09: 3 [IU] via SUBCUTANEOUS
  Administered 2022-12-09 – 2022-12-10 (×3): 2 [IU] via SUBCUTANEOUS
  Administered 2022-12-10: 3 [IU] via SUBCUTANEOUS
  Administered 2022-12-11 – 2022-12-12 (×5): 2 [IU] via SUBCUTANEOUS
  Administered 2022-12-12: 3 [IU] via SUBCUTANEOUS
  Administered 2022-12-13: 2 [IU] via SUBCUTANEOUS
  Administered 2022-12-13: 3 [IU] via SUBCUTANEOUS
  Administered 2022-12-13: 2 [IU] via SUBCUTANEOUS
  Administered 2022-12-14: 3 [IU] via SUBCUTANEOUS
  Administered 2022-12-14: 1 [IU] via SUBCUTANEOUS
  Administered 2022-12-15: 5 [IU] via SUBCUTANEOUS
  Administered 2022-12-15: 1 [IU] via SUBCUTANEOUS

## 2022-12-06 MED ORDER — FOLIC ACID 1 MG PO TABS
1.0000 mg | ORAL_TABLET | Freq: Every day | ORAL | Status: DC
  Administered 2022-12-07 – 2022-12-15 (×9): 1 mg via ORAL
  Filled 2022-12-06 (×9): qty 1

## 2022-12-06 MED ORDER — APIXABAN 2.5 MG PO TABS
2.5000 mg | ORAL_TABLET | Freq: Two times a day (BID) | ORAL | Status: DC
  Administered 2022-12-06 – 2022-12-15 (×18): 2.5 mg via ORAL
  Filled 2022-12-06 (×17): qty 1

## 2022-12-06 MED ORDER — HYDRALAZINE HCL 50 MG PO TABS
50.0000 mg | ORAL_TABLET | Freq: Four times a day (QID) | ORAL | Status: DC
  Administered 2022-12-06 – 2022-12-15 (×35): 50 mg via ORAL
  Filled 2022-12-06 (×9): qty 1
  Filled 2022-12-06: qty 2
  Filled 2022-12-06 (×16): qty 1
  Filled 2022-12-06: qty 2
  Filled 2022-12-06 (×8): qty 1

## 2022-12-06 MED ORDER — FUROSEMIDE 10 MG/ML IJ SOLN
40.0000 mg | Freq: Two times a day (BID) | INTRAMUSCULAR | Status: DC
  Administered 2022-12-07 – 2022-12-08 (×4): 40 mg via INTRAVENOUS
  Filled 2022-12-06 (×4): qty 4

## 2022-12-06 MED ORDER — FEBUXOSTAT 40 MG PO TABS
40.0000 mg | ORAL_TABLET | Freq: Every day | ORAL | Status: DC
  Administered 2022-12-06: 40 mg via ORAL
  Filled 2022-12-06 (×2): qty 1

## 2022-12-06 MED ORDER — VITAMIN B-12 1000 MCG PO TABS
1000.0000 ug | ORAL_TABLET | Freq: Every day | ORAL | Status: DC
Start: 2022-12-06 — End: 2022-12-15
  Administered 2022-12-06 – 2022-12-15 (×10): 1000 ug via ORAL
  Filled 2022-12-06 (×10): qty 1

## 2022-12-06 NOTE — H&P (Cosign Needed Addendum)
Date: 12/06/2022               Patient Name:  Amanda [name] MRN: [id number]  DOB: 1940/06/20 Age / Sex: 82 y.o., female   PCP: Street, Stephanie Coup, MD         Medical Service: Internal Medicine Teaching Service         Attending Physician: Dr. Earl Lagos, MD      First Contact: Dr. Kathleen Lime, MD Pager [phone]    Second Contact: Dr. [name], MD Pager [phone]         After Hours (After 5p/  First Contact Pager: [phone]  weekends / holidays): Second Contact Pager: [phone]   SUBJECTIVE   Chief Complaint: rapid heart rate  History of Present Illness: Ms. Amanda [name] is an 82 yo female with a pertinent PMH of atrial fibrillation s/p cardioversion x 4 and watchman placement (2018) not on [facility] due to GI bleeds, CVA (2018), CAD s/p STEMI/cardiogenic shock with RCA drug eluting stent placement (2018), moderate-to-severe MR, severe TR, syncope s/p biventricular pacemaker placement (2021), T2DM, HTN, HLD, CKD stage III/IV, chronic anemia, GERD, and ischemic colitis who presented with shortness of breath and fatigue in the setting of atrial fibrillation.   She states that she has been having shortness of breath, tachycardia/palpitations, and LE weakness for over a month, but has noticed a worsening in the past few days. She states she previously was independent and able to walk around her house without any issues, but now states she has had to stop and catch her breath after less than 20 feet. She feels this has been worsening, so she decided to come to the hospital. She also feels like her legs are heavier than usual. She notes that she has had atrial fibrillation for some time and has had 4 prior cardioversions, most recently on 8/28, which converted her to sinus rhythm for a few days but she returned to Afib. She states she has not been taking eliquis for ~2 months due to GI bleeding and pending an endoscopy scheduled for 12/2. She does not use any O2 at home. She  denies missing any doses of her medications and states she took them this morning.   She also notes she has had a nighttime cough and feels like she has to sit up in bed to breathe better, but says this is a chronic problem and has not noted a sudden worsening recently, but does feel like she may have more fluid in her stomach and legs than usual. She denies any current chest pain, recent illnesses, active bleeding, recent falls, nausea/vomiting, fever/chills.   ED Course: Pt presented in Afib w/ RVR, HR in 120s on arrival, given Cardizem 15 mg.  Mild hyperkalemia with potassium of 5.2.  Chest x-ray showed mild pulmonary edema, cardiomegaly.  IV Lasix given.  CT head ordered in the setting of possible confusion, unremarkable.  Lactic 0.6.  Normocytic anemia with a hemoglobin of 8.4. BNP ordered.   Meds:  Amiodarone 200 mg daily Amlodipine 5 mg daily Biotin 1000 mcg daily Refresh relieva eye drops, 1 drop both eyes 4 times daily PRN dry eyes Cholecalciferol 2000 units daily Coenzyme Q10 100 mg BID Colchicine 0.6 mg daily PRN gout flare Denosumab 60 mg injection q6 months Colace 100 mg PRN  Estradiol 1 applicatorfull vaginally MWF Evolocumab 140 mg injection q14d Famotidine 40 mg daily Febuxostat 40 mg daily Glucerna shake TID between meals Ferrous sulfate 325 mg BID Folic acid 800 mcg  daily Osteo bi-flex 25 mcg daily Hydralazine 50 mg q6h Isosorbide mononitrate 60 mg daily Lactobacillus caps 1 capsule daily Levothyroxine 50 mcg daily X MV 1 capsule daily Nitroglycerin 0.4 mg PRN Omega-3 acid 2 capsules daily Protonix 40 mg daily X Miralax 17g every other day Pravastatin 40 mg daily Ranolazine 500 mg BID Torsemide 10 mg BID Vitamin B12 1000 mcg daily Benefiber 1 scoop every other day  Past Medical History: Atrial fibrillation s/p watchmans procedure 2018, not currently on anticoagulation, and multiple cardioversions CAD STEMI s/p RCA DES 08/2016 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 Moderate-severe mitral regurgitation Severe tricuspid regurgitation 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 Ischemic CVA 2018 HTN HLD CKD stage III/IV Anemia of chronic disease vs iron deficiency  OSA w/o CPAP use GERD Ischemic colitis T2DM Hypothyroidism    Past Surgical History:  Procedure Laterality Date   BUBBLE STUDY  05/16/2020   Procedure: BUBBLE STUDY;  Surgeon: Parke Poisson, MD;  Location: [facility] ENDOSCOPY;  Service: Cardiovascular;;   CARDIAC CATHETERIZATION     CARDIOVERSION N/A 05/16/2020   Procedure: CARDIOVERSION;  Surgeon: Parke Poisson, MD;  Location: [facility] ENDOSCOPY;  Service: Cardiovascular;  Laterality: N/A;   CARDIOVERSION N/A 09/06/2020   Procedure: CARDIOVERSION;  Surgeon: Little Ishikawa, MD;  Location: [facility] ENDOSCOPY;  Service: Cardiovascular;  Laterality: N/A;   CARDIOVERSION N/A 05/16/2021   Procedure: CARDIOVERSION;  Surgeon: Quintella Reichert, MD;  Location: [facility] ENDOSCOPY;  Service: Cardiovascular;  Laterality: N/A;   CARDIOVERSION N/A 08/12/2022   Procedure: CARDIOVERSION;  Surgeon: Thomasene Ripple, DO;  Location: [facility] INVASIVE CV LAB;  Service: Cardiovascular;  Laterality: N/A;   CARDIOVERSION N/A 09/30/2022   Procedure: CARDIOVERSION;  Surgeon: Jodelle Red, MD;  Location: [facility] INVASIVE CV LAB;  Service: Cardiovascular;  Laterality: N/A;   COLONOSCOPY  11/21/2014   Moderate predominantly sigmoid diverticulosis. Otherwise noraml collonscopy to TI.    CORONARY ANGIOPLASTY WITH STENT PLACEMENT  08/2016   EXCISIONAL HEMORRHOIDECTOMY     LEFT ATRIAL APPENDAGE OCCLUSION  11/2016   in [place]   LOOP RECORDER INSERTION N/A 02/23/2018   Procedure: LOOP RECORDER INSERTION;  Surgeon: Regan Lemming, MD;  Location: [facility] INVASIVE CV LAB;  Service: Cardiovascular;  Laterality: N/A;   LOOP RECORDER REMOVAL N/A 04/24/2019   Procedure: LOOP RECORDER REMOVAL;  Surgeon: Regan Lemming, MD;   Location: [facility] INVASIVE CV LAB;  Service: Cardiovascular;  Laterality: N/A;   NOSE SURGERY     PACEMAKER IMPLANT N/A 04/24/2019   Procedure: PACEMAKER IMPLANT;  Surgeon: Regan Lemming, MD;  Location: [facility] INVASIVE CV LAB;  Service: Cardiovascular;  Laterality: N/A;   TEE WITH CARDIOVERSION     TEE WITHOUT CARDIOVERSION N/A 05/16/2020   Procedure: TRANSESOPHAGEAL ECHOCARDIOGRAM (TEE);  Surgeon: Parke Poisson, MD;  Location: [facility] ENDOSCOPY;  Service: Cardiovascular;  Laterality: N/A;   TEE WITHOUT CARDIOVERSION N/A 08/12/2022   Procedure: TRANSESOPHAGEAL ECHOCARDIOGRAM;  Surgeon: Thomasene Ripple, DO;  Location: [facility] INVASIVE CV LAB;  Service: Cardiovascular;  Laterality: N/A;   TEE WITHOUT CARDIOVERSION N/A 09/30/2022   Procedure: TRANSESOPHAGEAL ECHOCARDIOGRAM;  Surgeon: Jodelle Red, MD;  Location: [facility] INVASIVE CV LAB;  Service: Cardiovascular;  Laterality: N/A;   TOTAL HIP REVISION Left 03/25/2021   Procedure: LEFT POSTERIOR TOTAL HIP  ZIMMER CABLES;  Surgeon: Durene Romans, MD;  Location: [facility] ORS;  Service: Orthopedics;  Laterality: Left;   Social:  Lives With: husband, daughter lives next door and helps out. No pets. Occupation: Retired, formerly worked in a Technical sales engineer  Support: Husband and daughter Level of Function: Previously independent in ADLs, iADLs; recently difficulty due to SOB and LE weakness PCP: Dr. Maryjean Ka at [facility] Substances: No current alcohol use, stopped about a month ago, not heavy user when she did drink--1 glass of wine dialy, sometimes liquor. No tobacco use. No illicit drugs.   Family History:  Family History  Problem Relation Age of Onset   Breast cancer Mother    Hypertension Father    Prostate cancer Father    Stroke Father    Diabetes Sister    Heart attack Sister    Heart attack Brother    Cancer Maternal Grandfather        not sure if it is colon or rectal    Heart attack Maternal Uncle    Rectal cancer Maternal Uncle     Stroke Maternal Uncle    Esophageal cancer Neg Hx    Pancreatic cancer Neg Hx    Stomach cancer Neg Hx     Allergies: Allergies as of 12/06/2022 - Review Complete 12/06/2022  Allergen Reaction Noted   Ciprofloxacin Other (See Comments) 06/22/2016   Contrast media [iodinated contrast media]  04/17/2019   Crestor [rosuvastatin]  06/01/2022   Lipitor [atorvastatin]  06/01/2022   Nsaids  04/17/2019   Statins Other (See Comments) 06/22/2016   Review of Systems: A complete ROS was negative except as per HPI.   OBJECTIVE:   Physical Exam: Blood pressure (!) 141/95, pulse (!) 143, temperature 99.3 F (37.4 C), temperature source Oral, resp. rate (!) 21, SpO2 93%.  Constitutional: laying in bed in NAD, stable, on 2L [place] HENT: normocephalic atraumatic, mucous membranes moist Eyes: conjunctiva non-erythematous Neck: supple Cardiovascular: tachycardic, murmur present but unable to characterize due to rate, JVD present, mild abdominal edema, 1+ LE pitting edema Pulmonary/Chest: bibasilar crackles, cough with deep inspiration, poor inspiratory effort, normal WOB and rate Abdominal: soft, non-tender, non-distended MSK: normal bulk and tone Neurological: alert & oriented x 4, 5/5 strength in bilateral upper and lower extremities, no sensory deficits, finger-to-nose normal, no focal deficits noted Skin: warm and dry; dusky induration of skin superior to the left ankle Psych: normal mood and affect  Labs: CBC    Component Value Date/Time   WBC 8.6 12/06/2022 1810   RBC 3.06 (L) 12/06/2022 1810   HGB 8.4 (L) 12/06/2022 1810   HGB 10.0 (L) 11/19/2022 1148   HCT 28.1 (L) 12/06/2022 1810   HCT 31.6 (L) 11/19/2022 1148   PLT 374 12/06/2022 1810   PLT 300 11/19/2022 1148   MCV 91.8 12/06/2022 1810   MCV 91 11/19/2022 1148   MCH 27.5 12/06/2022 1810   MCHC 29.9 (L) 12/06/2022 1810   RDW 16.6 (H) 12/06/2022 1810   RDW 15.7 (H) 11/19/2022 1148   LYMPHSABS 0.3 (L) 12/06/2022 1810    LYMPHSABS 0.5 (L) 09/18/2022 1602   MONOABS 0.7 12/06/2022 1810   EOSABS 0.5 12/06/2022 1810   EOSABS 0.3 09/18/2022 1602   BASOSABS 0.0 12/06/2022 1810   BASOSABS 0.0 09/18/2022 1602    CMP     Component Value Date/Time   NA 138 12/06/2022 1810   NA 140 11/19/2022 1148   K 5.2 (H) 12/06/2022 1810   CL 108 12/06/2022 1810   CO2 19 (L) 12/06/2022 1810   GLUCOSE 230 (H) 12/06/2022 1810   BUN 46 (H) 12/06/2022 1810   BUN 39 (H) 11/19/2022 1148   CREATININE 2.17 (H) 12/06/2022 1810   CALCIUM 10.5 (H) 12/06/2022  1810   PROT 6.2 (L) 12/06/2022 1810   PROT 6.5 07/30/2022 1323   ALBUMIN 2.3 (L) 12/06/2022 1810   ALBUMIN 4.1 07/30/2022 1323   AST 39 12/06/2022 1810   ALT 29 12/06/2022 1810   ALKPHOS 119 12/06/2022 1810   BILITOT 1.1 12/06/2022 1810   BILITOT <0.2 07/30/2022 1323   GFRNONAA 22 (L) 12/06/2022 1810   GFRAA 27 (L) 04/24/2019 0550   Urinalysis: No hematuria, pyuria, bacteriuria. Calcium oxylate crystals present.  Blood cultures: pending aPTT/INR: normal Respiratory panel: negative BNP: 1,011  Imaging: CT Head Wo Contrast CLINICAL DATA:  The lower in.  EXAM: CT HEAD WITHOUT CONTRAST  TECHNIQUE: Contiguous axial images were obtained from the base of the skull through the vertex without intravenous contrast.  RADIATION DOSE REDUCTION: This exam was performed according to the departmental dose-optimization program which includes automated exposure control, adjustment of the mA and/or kV according to patient size and/or use of iterative reconstruction technique.  COMPARISON:  Head CT dated 05/03/2020.  FINDINGS: Brain: Mild age-related atrophy and chronic microvascular ischemic changes. 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 There is no acute intracranial hemorrhage. No mass effect or midline shift. No extra-axial fluid collection.  Vascular: No hyperdense vessel or unexpected calcification.  Skull: Normal. Negative for fracture or focal  lesion.  Sinuses/Orbits: No acute finding.  Other: None  IMPRESSION: 1. No acute intracranial pathology. 2. Mild age-related atrophy and chronic microvascular ischemic changes. 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  Electronically Signed   By: Elgie Collard M.D.   On: 12/06/2022 17:54 DG Chest Port 1 View CLINICAL DATA:  Sepsis  EXAM: PORTABLE CHEST 1 VIEW  COMPARISON:  X-ray 03/30/2021  FINDINGS: Enlarged cardiopericardial silhouette. Calcified aorta. Increasing interstitial changes the lungs. Favor edema with vascular congestion rather than infiltrate. Recommend follow-up. Small pleural effusions. No pneumothorax. Left upper chest battery pack with leads along the right side of the heart for pacemaker. Overlapping cardiac leads. Atrial occlusion device.  IMPRESSION: Pacemaker.  Enlarged heart.  Developing interstitial changes seen of the lungs, favor edema. Small effusions. Recommend follow-up.  Electronically Signed   By: Karen Kays M.D.   On: 12/06/2022 17:37    EKG: personally reviewed my interpretation is atrial fibrillation. 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   ASSESSMENT & PLAN:   Assessment & Plan by Problem: Principal Problem:   Atrial fibrillation with RVR (HCC)  CHRISETTE MAN is a 82 y.o. person living with a pertinent history of atrial fibrillation s/p cardioversions and watchman placement, CAD, STEMI s/p DES placement, Hx of CVA (2018), and HFpEF (EF 55-60%) who presented with shortness of breath and tachycardia and is admitted for atrial fibrillation with RVR on hospital day 0  Atrial fibrillation with RVR, not on anticoagulation History of cardioversion x 4, watchman procedure History of CVA (2018) Presented in atrial fibrillation with a heart rate in the 120s, down to 90s a single dose of diltiazem.  Hemodynamically stable.  No neurological deficits on exam.  Per chart review, she had a recent cardioversion in August, but  returned to atrial fibrillation shortly thereafter and was evaluated by her cardiologist about a month ago. At that time, she was also experiencing her current symptoms including shortness of breath, tachycardia, and weakness. Her amiodarone dose was increased and an ablation was scheduled for December 16th.  She has not been taking anticoagulation due to prior positive FOBT testing, but her CHADSVASc score is 9, so we will start her on anticoagulation as her  stroke risk is quite high. Thrombi were previously noted on an echocardiogram, but her most recent echo on 8/28 did not demonstrate any thrombi.   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  BNP was elevated at 1,011.  She was given 40 mg IV Lasix in the ED.  Most recent echocardiogram was in August, which showed an ejection fraction of 55 to 60%, redemonstrated moderate to severe mitral regurgitation and severe tricuspid regurgitation, and mild dilation of the left atrium. She takes Torsemide 10 mg BID at home. Unclear etiology of her acute heart failure exacerbation, but it may be secondary to her poorly controlled atrial fibrillation.  Her current symptoms are most likely due to her atrial fibrillation and heart failure exacerbation, but if her dyspnea does not improve with rate control and  diuresis, would consider working up amiodarone toxicity.  Plan: - IV Lasix 40 mg IV BID, which should also help mild hyperkalemia - Strict I's and O's - Follow up troponin - Morning CBC, BMP - Consider repeat echo - Continue Hydralazine 50 mg q6h, Isosorbide mononitrate 60 mg daily  Chronic conditions: Anemia of chronic disease vs iron deficiency- Hgb 8.4 on admission, baseline ~9. History of GI bleeds, on Ferrous sulfate 325 mg BID at home, follows with GI. Normocytic anemia, likely secondary to CKD +/- iron deficiency. Iron panel 3 months ago showed iron 41, transferrin 195, Sat 15, ferritin 216, TIBC 273.  Hypercalcemia- known history of benign familial hypocalciuric hypercalcemia. Follows with nephrology. Parathyroid scan previously unremarkable. PTH historically wnl.  T2DM- Hemoglobin A1c pending. Blood glucose elevated at 230 on admission. Started SSI.  HTN- BP elevated at 149/81. Continued home amlodipine 5 mg daily.  HLD- Continue Pravastatin 40 mg daily. Evolocumab 140 mg injection q14d. CKD stage III/IV- Creatinine 2.17 on admission, around baseline.  Hypothyroidism- TSH pending. Last TSH 5.33 a few months ago. Continued home Levothyroxine 50 mcg daily.   GERD- Continued Famotidine 40 mg daily. Held home Protonix 40 mg daily.  OSA- No CPAP use.  Gout- Continued Febuxostat 40 mg daily; held Colchicine 0.6 mg daily PRN  CAD- Held Nitroglycerin 0.4 mg PRN, Ranolazine 500 mg BID; on aspirin at home.  Constipation- Colace 100 mg PRN Osteoporosis- Denosumab 60 mg injection q6 months  Diet: Heart Healthy VTE: Eliquis IVF: None Code: Full  Prior to Admission Living Arrangement: Home, living with husband Anticipated Discharge Location: Home Barriers to Discharge: rate control, improvement of dyspnea  Dispo: Admit patient to Observation with expected length of stay less than 2 midnights.  Signed: Annett Fabian, MD Internal Medicine Resident, PGY-1 [facility]  Pager: [phone]  12/06/2022, 10:46 PM

## 2022-12-06 NOTE — ED Notes (Addendum)
ED TO INPATIENT HANDOFF REPORT  ED Nurse Name and Phone #: Jeronimo Norma  ADDITIONAL NOTES ADDED: SCROLL DOWN S Name/Age/Gender Amanda [name] 82 y.o. female Room/Bed: 007C/007C  Code Status   Code Status: Full Code  Home/SNF/Other Home Patient oriented to: self, place, time, and situation Is this baseline? Yes   Triage Complete: Triage complete  Chief Complaint Atrial fibrillation with RVR (HCC) [I48.91]  Triage Note Pt BIB [facility] from home c/o not feeling well and being in a-fib x4 days. Pt denies any pain or SHOB.    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 LAD: 0% and Normal. RCA:   Lesion on Mid RCA: Mid subsection.95% stenosis reduced to 0%. Pre procedure   TIMI II flow was noted. Post Procedure TIMI III flow was present. Poor run   off was present. The lesion was diagnosed as High Risk (C).    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   History of cardiac monitoring 02/23/2018   monitor inserted   History of iron deficiency anemia 12/28/2017   History of transesophageal echocardiography (TEE)    Hyperlipidemia    Hypertensive heart disease with heart failure (HCC)    Iron deficiency anemia 12/28/2017   LV dysfunction 09/02/2016   Myocardial infarction [facility])    NSTEMI (non-ST elevated myocardial infarction) (HCC) 08/06/2016   Obstructive sleep apnea 09/17/2014   Orthostatic hypotension 12/28/2017   OSA on CPAP  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   Postural dizziness with presyncope 09/23/2017   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/24/2017   Recurrent left pleural effusion 03/14/2015   Renal failure, chronic, stage 3 (moderate) (HCC)    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   Syncope 11/04/2017   Systolic heart failure (HCC)    Thyroid disease    Vitamin D deficiency 04/04/2015   Past Surgical History:  Procedure Laterality Date   BUBBLE STUDY  05/16/2020   Procedure: BUBBLE STUDY;  Surgeon: Parke Poisson, MD;  Location: [facility]  ENDOSCOPY;  Service: Cardiovascular;;   CARDIAC CATHETERIZATION     CARDIOVERSION N/A 05/16/2020   Procedure: CARDIOVERSION;  Surgeon: Parke Poisson, MD;  Location: [facility] ENDOSCOPY;  Service: Cardiovascular;  Laterality: N/A;   CARDIOVERSION N/A 09/06/2020   Procedure: CARDIOVERSION;  Surgeon: Little Ishikawa, MD;  Location: [facility] ENDOSCOPY;  Service: Cardiovascular;  Laterality: N/A;   CARDIOVERSION N/A 05/16/2021   Procedure: CARDIOVERSION;  Surgeon: Quintella Reichert, MD;  Location: [facility] ENDOSCOPY;  Service: Cardiovascular;  Laterality: N/A;   CARDIOVERSION N/A 08/12/2022   Procedure: CARDIOVERSION;  Surgeon: Thomasene Ripple, DO;  Location: [facility] INVASIVE CV LAB;  Service: Cardiovascular;  Laterality: N/A;   CARDIOVERSION N/A 09/30/2022   Procedure: CARDIOVERSION;  Surgeon: Jodelle Red, MD;  Location: [facility] INVASIVE CV LAB;  Service: Cardiovascular;  Laterality: N/A;   COLONOSCOPY  11/21/2014   Moderate predominantly sigmoid diverticulosis. Otherwise noraml collonscopy to TI.    CORONARY ANGIOPLASTY WITH STENT PLACEMENT  08/2016   EXCISIONAL HEMORRHOIDECTOMY     LEFT ATRIAL APPENDAGE OCCLUSION  11/2016   in [place]   LOOP RECORDER INSERTION N/A 02/23/2018   Procedure: LOOP RECORDER INSERTION;  Surgeon: Regan Lemming, MD;  Location: [facility] INVASIVE CV LAB;  Service: Cardiovascular;  Laterality: N/A;   LOOP RECORDER REMOVAL N/A 04/24/2019   Procedure: LOOP RECORDER REMOVAL;  Surgeon: Regan Lemming, MD;  Location: [facility] INVASIVE CV LAB;  Service: Cardiovascular;  Laterality: N/A;   NOSE SURGERY     PACEMAKER IMPLANT N/A 04/24/2019   Procedure: PACEMAKER IMPLANT;  Surgeon: Regan Lemming, MD;  Location: [facility] INVASIVE CV LAB;  Service: Cardiovascular;  Laterality: N/A;   TEE WITH CARDIOVERSION     TEE WITHOUT CARDIOVERSION N/A 05/16/2020   Procedure: TRANSESOPHAGEAL ECHOCARDIOGRAM (TEE);  Surgeon: Parke Poisson, MD;  Location: [facility] ENDOSCOPY;  Service: Cardiovascular;   Laterality: N/A;   TEE WITHOUT CARDIOVERSION N/A 08/12/2022   Procedure: TRANSESOPHAGEAL ECHOCARDIOGRAM;  Surgeon: Thomasene Ripple, DO;  Location: [facility] INVASIVE CV LAB;  Service: Cardiovascular;  Laterality: N/A;   TEE WITHOUT CARDIOVERSION N/A 09/30/2022   Procedure: TRANSESOPHAGEAL ECHOCARDIOGRAM;  Surgeon: Jodelle Red, MD;  Location: [facility] INVASIVE CV LAB;  Service: Cardiovascular;  Laterality: N/A;   TOTAL HIP REVISION Left 03/25/2021   Procedure: LEFT POSTERIOR TOTAL HIP  ZIMMER CABLES;  Surgeon: Durene Romans, MD;  Location: [facility] ORS;  Service: Orthopedics;  Laterality: Left;     A IV Location/Drains/Wounds Patient Lines/Drains/Airways Status     Active Line/Drains/Airways     Name Placement date Placement time Site Days   Peripheral IV 03/25/21 20 G Right Hand 03/25/21  1409  Hand  621   Peripheral IV 12/06/22 20 G 1" Left;Posterior Hand 12/06/22  1803  Hand  less than 1   Wound / Incision (Open or Dehisced) 03/24/21 Skin tear Arm Lower;Posterior;Right red, bleeding 03/24/21  1930  Arm  622  Intake/Output Last 24 hours No intake or output data in the 24 hours ending 12/06/22 2124  Labs/Imaging Results for orders placed or performed during the hospital encounter of 12/06/22 (from the past 48 hour(s))  Resp panel by RT-PCR (RSV, Flu A&B, Covid) Anterior Nasal Swab     Status: None   Collection Time: 12/06/22  5:14 PM   Specimen: Anterior Nasal Swab  Result Value Ref Range   SARS Coronavirus 2 by RT PCR NEGATIVE NEGATIVE   Influenza A by PCR NEGATIVE NEGATIVE   Influenza B by PCR NEGATIVE NEGATIVE    Comment: (NOTE) The Xpert Xpress SARS-CoV-2/FLU/RSV plus assay is intended as an aid in the diagnosis of influenza from Nasopharyngeal swab specimens and should not be used as a sole basis for treatment. Nasal washings and aspirates are unacceptable for Xpert Xpress SARS-CoV-2/FLU/RSV testing.  Fact Sheet for  Patients: BloggerCourse.com  Fact Sheet for Healthcare Providers: SeriousBroker.it  This test is not yet approved or cleared by the [place] FDA and has been authorized for detection and/or diagnosis of SARS-CoV-2 by FDA under an Emergency Use Authorization (EUA). This EUA will remain in effect (meaning this test can be used) for the duration of the COVID-19 declaration under Section 564(b)(1) of the Act, 21 U.S.C. section 360bbb-3(b)(1), unless the authorization is terminated or revoked.     Resp Syncytial Virus by PCR NEGATIVE NEGATIVE    Comment: (NOTE) Fact Sheet for Patients: BloggerCourse.com  Fact Sheet for Healthcare Providers: SeriousBroker.it  This test is not yet approved or cleared by the [place] FDA and has been authorized for detection and/or diagnosis of SARS-CoV-2 by FDA under an Emergency Use Authorization (EUA). This EUA will remain in effect (meaning this test can be used) for the duration of the COVID-19 declaration under Section 564(b)(1) of the Act, 21 U.S.C. section 360bbb-3(b)(1), unless the authorization is terminated or revoked.  Performed at [facility], [address] [place], [place] [address]   Urinalysis, w/ Reflex to Culture (Infection Suspected) -Urine, Clean Catch     Status: Abnormal   Collection Time: 12/06/22  5:36 PM  Result Value Ref Range   Specimen Source URINE, CLEAN CATCH    Color, Urine YELLOW YELLOW   APPearance CLEAR CLEAR   Specific Gravity, Urine 1.025 1.005 - 1.030   pH 5.5 5.0 - 8.0   Glucose, UA NEGATIVE NEGATIVE mg/dL   Hgb urine dipstick TRACE (A) NEGATIVE   Bilirubin Urine NEGATIVE NEGATIVE   Ketones, ur NEGATIVE NEGATIVE mg/dL   Protein, ur 295 (A) NEGATIVE mg/dL   Nitrite NEGATIVE NEGATIVE   Leukocytes,Ua NEGATIVE NEGATIVE   Squamous Epithelial / HPF 0-5 0 - 5 /HPF   WBC, UA 0-5 0 - 5 WBC/hpf     Comment: Reflex urine culture not performed if WBC <=10, OR if Squamous epithelial cells >5. If Squamous epithelial cells >5, suggest recollection.   RBC / HPF 6-10 0 - 5 RBC/hpf   Bacteria, UA FEW (A) NONE SEEN   Ca Oxalate Crys, UA PRESENT     Comment: Performed at [facility], [address], [place], [place] [address]  Comprehensive metabolic panel     Status: Abnormal   Collection Time: 12/06/22  6:10 PM  Result Value Ref Range   Sodium 138 135 - 145 mmol/L   Potassium 5.2 (H) 3.5 - 5.1 mmol/L    Comment: HEMOLYSIS AT THIS LEVEL MAY AFFECT RESULT   Chloride 108 98 - 111 mmol/L   CO2  19 (L) 22 - 32 mmol/L   Glucose, Bld 230 (H) 70 - 99 mg/dL    Comment: Glucose reference range applies only to samples taken after fasting for at least 8 hours.   BUN 46 (H) 8 - 23 mg/dL   Creatinine, Ser 1.61 (H) 0.44 - 1.00 mg/dL   Calcium 09.6 (H) 8.9 - 10.3 mg/dL   Total Protein 6.2 (L) 6.5 - 8.1 g/dL   Albumin 2.3 (L) 3.5 - 5.0 g/dL   AST 39 15 - 41 U/L    Comment: HEMOLYSIS AT THIS LEVEL MAY AFFECT RESULT   ALT 29 0 - 44 U/L    Comment: HEMOLYSIS AT THIS LEVEL MAY AFFECT RESULT   Alkaline Phosphatase 119 38 - 126 U/L   Total Bilirubin 1.1 0.3 - 1.2 mg/dL    Comment: HEMOLYSIS AT THIS LEVEL MAY AFFECT RESULT   GFR, Estimated 22 (L) >60 mL/min    Comment: (NOTE) Calculated using the CKD-EPI Creatinine Equation (2021)    Anion gap 11 5 - 15    Comment: Performed at [facility], [address], [place], [place] [address]  CBC with Differential     Status: Abnormal   Collection Time: 12/06/22  6:10 PM  Result Value Ref Range   WBC 8.6 4.0 - 10.5 K/uL   RBC 3.06 (L) 3.87 - 5.11 MIL/uL   Hemoglobin 8.4 (L) 12.0 - 15.0 g/dL   HCT 98.1 (L) 19.1 - 47.8 %   MCV 91.8 80.0 - 100.0 fL   MCH 27.5 26.0 - 34.0 pg   MCHC 29.9 (L) 30.0 - 36.0 g/dL   RDW 29.5 (H) 62.1 - 30.8 %   Platelets 374 150 - 400 K/uL   nRBC 0.5 (H) 0.0 - 0.2 %   Neutrophils Relative % 82 %   Neutro Abs 7.1 1.7 -  7.7 K/uL   Lymphocytes Relative 4 %   Lymphs Abs 0.3 (L) 0.7 - 4.0 K/uL   Monocytes Relative 8 %   Monocytes Absolute 0.7 0.1 - 1.0 K/uL   Eosinophils Relative 5 %   Eosinophils Absolute 0.5 0.0 - 0.5 K/uL   Basophils Relative 0 %   Basophils Absolute 0.0 0.0 - 0.1 K/uL   Immature Granulocytes 1 %   Abs Immature Granulocytes 0.07 0.00 - 0.07 K/uL    Comment: Performed at [facility], [address] [place], [place] [address]  Protime-INR     Status: None   Collection Time: 12/06/22  6:10 PM  Result Value Ref Range   Prothrombin Time 13.4 11.4 - 15.2 seconds   INR 1.0 0.8 - 1.2    Comment: (NOTE) INR goal varies based on device and disease states. Performed at [facility], [address], [place], [place] [address]   APTT     Status: None   Collection Time: 12/06/22  6:10 PM  Result Value Ref Range   aPTT 29 24 - 36 seconds    Comment: Performed at [facility], [address], [place], [place] [address]  I-Stat Lactic Acid, ED     Status: None   Collection Time: 12/06/22  6:16 PM  Result Value Ref Range   Lactic Acid, Venous 0.6 0.5 - 1.9 mmol/L   CT Head Wo Contrast  Result Date: 12/06/2022 CLINICAL DATA:  The lower in. EXAM: CT HEAD WITHOUT CONTRAST TECHNIQUE: Contiguous axial images were obtained from the base of the skull through the vertex without intravenous contrast. RADIATION  DOSE REDUCTION: This exam was performed according to the departmental dose-optimization program which includes automated exposure control, adjustment of the mA and/or kV according to patient size and/or use of iterative reconstruction technique. COMPARISON:  Head CT dated 05/03/2020. FINDINGS: Brain: Mild age-related atrophy and chronic microvascular ischemic changes. 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 There is no acute intracranial hemorrhage. No mass effect or midline shift. No extra-axial fluid collection. Vascular: No hyperdense vessel or unexpected calcification.  Skull: Normal. Negative for fracture or focal lesion. Sinuses/Orbits: No acute finding. Other: None IMPRESSION: 1. No acute intracranial pathology. 2. Mild age-related atrophy and chronic microvascular ischemic changes. 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 Electronically Signed   By: Elgie Collard M.D.   On: 12/06/2022 17:54   DG Chest Port 1 View  Result Date: 12/06/2022 CLINICAL DATA:  Sepsis EXAM: PORTABLE CHEST 1 VIEW COMPARISON:  X-ray 03/30/2021 FINDINGS: Enlarged cardiopericardial silhouette. Calcified aorta. Increasing interstitial changes the lungs. Favor edema with vascular congestion rather than infiltrate. Recommend follow-up. Small pleural effusions. No pneumothorax. Left upper chest battery pack with leads along the right side of the heart for pacemaker. Overlapping cardiac leads. Atrial occlusion device. IMPRESSION: Pacemaker.  Enlarged heart. Developing interstitial changes seen of the lungs, favor edema. Small effusions. Recommend follow-up. Electronically Signed   By: Karen Kays M.D.   On: 12/06/2022 17:37    Pending Labs Unresulted Labs (From admission, onward)     Start     Ordered   12/07/22 0500  TSH  Tomorrow morning,   R        12/06/22 2112   12/07/22 0500  T4, free  Tomorrow morning,   R        12/06/22 2112   12/07/22 0500  Comprehensive metabolic panel  Tomorrow morning,   R        12/06/22 2112   12/07/22 0500  CBC  Tomorrow morning,   R        12/06/22 2112   12/06/22 1928  Brain natriuretic peptide  Once,   URGENT        12/06/22 1927   12/06/22 1714  Blood Culture (routine x 2)  (Undifferentiated presentation (screening labs and basic nursing orders))  BLOOD CULTURE X 2,   STAT      12/06/22 1714            Vitals/Pain Today's Vitals   12/06/22 2015 12/06/22 2027 12/06/22 2030 12/06/22 2045  BP: (!) 139/107     Pulse: 86  (!) 54 86  Resp: (!) 27  (!) 24 18  Temp:      TempSrc:      SpO2: 95%  91% 95%  PainSc:  3       Isolation  Precautions No active isolations  Medications Medications  apixaban (ELIQUIS) tablet 2.5 mg (has no administration in time range)  diltiazem (CARDIZEM) injection 15 mg (15 mg Intravenous Given 12/06/22 1824)  furosemide (LASIX) injection 40 mg (40 mg Intravenous Given 12/06/22 1820)    Mobility walks     Focused Assessments Cardiac Assessment Handoff:  Cardiac Rhythm: Atrial fibrillation No results found for: "CKTOTAL", "CKMB", "CKMBINDEX", "TROPONINI" No results found for: "DDIMER" Does the Patient currently have chest pain? No   , Neuro Assessment Handoff:  Swallow screen pass? Yes  Cardiac Rhythm: Atrial fibrillation       Neuro Assessment:   Neuro Checks:      Has TPA been given? No If patient is a Neuro Trauma and patient  is going to OR before floor call report to 4N Charge nurse: [phone] or [phone]  , Renal Assessment Handoff:  Hemodialysis Schedule:  Last Hemodialysis date and time:   Restricted appendage:   , Pulmonary Assessment Handoff:  Lung sounds:   O2 Device: Nasal Cannula O2 Flow Rate (L/min): 3 L/min  Mildly tachypneic, and has been handing the /O2 fine.   R Recommendations: See Admitting Provider Note  Report given to:   Additional Notes:  Came in via EMS for weakness/fatigue and SOB. HX of Afib with controlled rate. Pt reports symptoms appeared and have been gettng worse for the past 4 days. Pt is warm to touch with a mild fever. Has obvious edema to extremities. Pt A/O x4 and has been communicating appropriately with staff. Able to stand/pivot to bedside toilet.   Has 1 IV In left hand that flow appropriately, May need to have IV team for additional Mds have visited and spoken with Pt.  Was initally a possible sepsis, however labs came back otherwise.   Initial HR: in the 120-130 and has received 15 of Cardizem and 40 of lasix.  Received her Eliquis. Presently resting in bed.

## 2022-12-06 NOTE — ED Provider Notes (Signed)
[place] EMERGENCY DEPARTMENT AT [facility] Provider Note  CSN: [id number] Arrival date & time: 12/06/22 1701  Chief Complaint(s) No chief complaint on file.  HPI Amanda [name] is a 82 y.o. female here today for rapid heart rate.  Patient has a history of atrial fibrillation, feels as though she went into A-fib 4 days ago.  She says that she has felt like she has had a fever and cough the last week or so.  She is not sure if she has been taking her Eliquis.  She says that she was told to stop taking her Eliquis.  She previously had told EMS that she did take her Eliquis.   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 LAD: 0% and Normal. RCA:   Lesion on Mid RCA: Mid subsection.95% stenosis reduced to 0%. Pre procedure   TIMI II flow was noted. Post Procedure TIMI III flow was present. Poor run   off was present. The lesion was diagnosed as High Risk (C).    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   History of  cardiac monitoring 02/23/2018   monitor inserted   History of iron deficiency anemia 12/28/2017   History of transesophageal echocardiography (TEE)    Hyperlipidemia    Hypertensive heart disease with heart failure (HCC)    Iron deficiency anemia 12/28/2017   LV dysfunction 09/02/2016   Myocardial infarction [facility])    NSTEMI (non-ST elevated myocardial infarction) (HCC) 08/06/2016   Obstructive sleep apnea 09/17/2014   Orthostatic hypotension 12/28/2017   OSA on CPAP    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   Postural dizziness with presyncope 09/23/2017   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/24/2017   Recurrent left pleural effusion 03/14/2015   Renal failure, chronic, stage 3 (moderate) (HCC)    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   Syncope 11/04/2017   Systolic heart failure (HCC)    Thyroid disease    Vitamin D deficiency 04/04/2015   Patient Active Problem List   Diagnosis Date Noted   Paroxysmal atrial flutter (HCC) 07/22/2022   Secondary hypercoagulable state (HCC) 04/23/2021   Constipation 03/28/2021   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   Closed left hip fracture (HCC) 03/23/2021   Mitral regurgitation severe based on TEE from 2022 April 06/10/2020   Thyroid disease    Chronic systolic CHF (congestive heart failure) (HCC)    OSA on CPAP    Gastroesophageal reflux    Diverticulosis  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   History of cardiac monitoring 02/23/2018   History of iron deficiency anemia 12/28/2017   Kidney disease 07/21/2017   Hypertensive disorder 07/21/2017   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/24/2017   Coronary artery disease 10/12/2016   AKI (acute kidney injury) (HCC) 09/01/2016   Abnormal nuclear stress test 06/23/2016   Anemia due to stage 4 chronic kidney disease (HCC) 04/04/2015   Benign hypertension with CKD (chronic kidney disease)  stage IV (HCC) 04/04/2015   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 Authorizing Provider  amiodarone (PACERONE) 200 MG tablet Take 1 tablet (200 mg total) by mouth daily. 10/15/22   Georgeanna Lea, MD  amLODipine (NORVASC) 5 MG tablet TAKE 1 TABLET BY MOUTH DAILY 08/20/22   Camnitz, Andree Coss, MD  Biotin 1000 MCG tablet Take 1,000 mcg by mouth daily.    [provider]  blood glucose meter kit and supplies KIT Dispense based on patient and insurance preference. Use up to four times daily as directed. Patient taking differently: Inject 1 each into the skin as directed. Dispense based on patient and insurance preference. Use up to four times daily as directed. 03/31/21   Swayze, Ava, DO  carboxymethylcellul-glycerin (REFRESH RELIEVA) 0.5-0.9 % ophthalmic solution Place 1 drop into both eyes 4 (four) times daily as needed for dry eyes. VE    [provider]  Cholecalciferol (VITAMIN D3) 50 MCG (2000 UT) TABS Take 2,000 Units by mouth daily at 12 noon.    [provider]  Coenzyme Q10 100 MG capsule Take 100 mg by mouth 2 (two) times daily.    [provider]  colchicine 0.6 MG tablet Take 0.6 mg by mouth daily as needed (gout). For gout flare ups    [provider]  denosumab (PROLIA) 60 MG/ML SOSY injection Inject 60 mg into the skin every 6 (six) months.    [provider]  docusate sodium (COLACE) 100 MG capsule Take 100 mg by mouth daily.    [provider]  estradiol (ESTRACE) 0.1 MG/GM vaginal cream Place 1 Applicatorful vaginally every Monday, Wednesday, and Friday.     [provider]  Evolocumab (REPATHA SURECLICK) 140 MG/ML SOAJ Inject 140 mg into the  skin every 14 (fourteen) days. 06/03/22   Georgeanna Lea, MD  famotidine (PEPCID) 40 MG tablet Take 40 mg by mouth every evening. 05/06/20   [provider]  febuxostat (ULORIC) 40 MG tablet Take 40 mg by mouth at bedtime.    [provider]  feeding supplement, GLUCERNA SHAKE, (GLUCERNA SHAKE) LIQD Take 237 mLs by mouth 3 (three) times daily between meals. 03/31/21   Swayze, Ava, DO  ferrous sulfate 325 (65 FE) MG EC tablet Take 325 mg by mouth 2 (two) times daily with breakfast and lunch. 12/28/17   [provider]  folic acid (FOLVITE) 800 MCG tablet Take 800 mcg by mouth daily at 12 noon.    [provider]  Glucosamine-Chondroitin (OSTEO BI-FLEX REGULAR STRENGTH PO) Take 25 mcg by mouth daily at 12 noon. Plus D3    [provider]  hydrALAZINE (APRESOLINE) 50 MG tablet Take 1 tablet (50 mg total) by  mouth every 6 (six) hours. 01/05/22   Georgeanna Lea, MD  isosorbide mononitrate (IMDUR) 60 MG 24 hr tablet Take 60 mg by mouth every morning.    [provider]  Lactobacillus (FLORAJEN ACIDOPHILUS) CAPS Take 1 capsule by mouth daily at 12 noon. 390 mg each    [provider]  levothyroxine (SYNTHROID, LEVOTHROID) 50 MCG tablet Take 50 mcg by mouth daily before breakfast.    [provider]  Multiple Vitamins-Minerals (PRESERVISION AREDS 2) CAPS Take 1 capsule by mouth 2 (two) times daily.    [provider]  nitroGLYCERIN (NITROSTAT) 0.4 MG SL tablet Place 0.4 mg under the tongue every 5 (five) minutes as needed for chest pain.    [provider]  omega-3 acid ethyl esters (LOVAZA) 1 g capsule Take 2 capsules (2 g total) by mouth daily. 06/22/22   Georgeanna Lea, MD  pantoprazole (PROTONIX) 40 MG tablet Take 1 tablet (40 mg total) by mouth daily. 10/14/21   Georgeanna Lea, MD  polyethylene glycol powder (GLYCOLAX/MIRALAX) powder Take 17 g by mouth every other day. Alternating between miralax and  benefiber    [provider]  pravastatin (PRAVACHOL) 40 MG tablet Take 40 mg by mouth at bedtime.    [provider]  ranolazine (RANEXA) 500 MG 12 hr tablet Take 1 tablet (500 mg total) by mouth 2 (two) times daily. 04/27/22   Georgeanna Lea, MD  torsemide (DEMADEX) 10 MG tablet Take 1 tablet (10 mg total) by mouth 2 (two) times daily. 07/30/22   Flossie Dibble, NP  vitamin B-12 (CYANOCOBALAMIN) 1000 MCG tablet Take 1,000 mcg by mouth in the morning and at bedtime.    [provider]  Wheat Dextrin (BENEFIBER DRINK MIX PO) Take 1 Scoop by mouth every other day. Mix 1 capful with beverage and drink every other day    [provider]                                                                                                                                    Past Surgical History Past Surgical History:  Procedure Laterality Date   BUBBLE STUDY  05/16/2020   Procedure: BUBBLE STUDY;  Surgeon: Parke Poisson, MD;  Location: [facility] ENDOSCOPY;  Service: Cardiovascular;;   CARDIAC CATHETERIZATION     CARDIOVERSION N/A 05/16/2020   Procedure: CARDIOVERSION;  Surgeon: Parke Poisson, MD;  Location: [facility] ENDOSCOPY;  Service: Cardiovascular;  Laterality: N/A;   CARDIOVERSION N/A 09/06/2020   Procedure: CARDIOVERSION;  Surgeon: Little Ishikawa, MD;  Location: [facility] ENDOSCOPY;  Service: Cardiovascular;  Laterality: N/A;   CARDIOVERSION N/A 05/16/2021   Procedure: CARDIOVERSION;  Surgeon: Quintella Reichert, MD;  Location: [facility] ENDOSCOPY;  Service: Cardiovascular;  Laterality: N/A;   CARDIOVERSION N/A 08/12/2022   Procedure: CARDIOVERSION;  Surgeon: Thomasene Ripple, DO;  Location: [facility] INVASIVE CV LAB;  Service: Cardiovascular;  Laterality: N/A;  CARDIOVERSION N/A 09/30/2022   Procedure: CARDIOVERSION;  Surgeon: Jodelle Red, MD;  Location: [facility] INVASIVE CV LAB;  Service: Cardiovascular;  Laterality: N/A;   COLONOSCOPY  11/21/2014   Moderate  predominantly sigmoid diverticulosis. Otherwise noraml collonscopy to TI.    CORONARY ANGIOPLASTY WITH STENT PLACEMENT  08/2016   EXCISIONAL HEMORRHOIDECTOMY     LEFT ATRIAL APPENDAGE OCCLUSION  11/2016   in [place]   LOOP RECORDER INSERTION N/A 02/23/2018   Procedure: LOOP RECORDER INSERTION;  Surgeon: Regan Lemming, MD;  Location: [facility] INVASIVE CV LAB;  Service: Cardiovascular;  Laterality: N/A;   LOOP RECORDER REMOVAL N/A 04/24/2019   Procedure: LOOP RECORDER REMOVAL;  Surgeon: Regan Lemming, MD;  Location: [facility] INVASIVE CV LAB;  Service: Cardiovascular;  Laterality: N/A;   NOSE SURGERY     PACEMAKER IMPLANT N/A 04/24/2019   Procedure: PACEMAKER IMPLANT;  Surgeon: Regan Lemming, MD;  Location: [facility] INVASIVE CV LAB;  Service: Cardiovascular;  Laterality: N/A;   TEE WITH CARDIOVERSION     TEE WITHOUT CARDIOVERSION N/A 05/16/2020   Procedure: TRANSESOPHAGEAL ECHOCARDIOGRAM (TEE);  Surgeon: Parke Poisson, MD;  Location: [facility] ENDOSCOPY;  Service: Cardiovascular;  Laterality: N/A;   TEE WITHOUT CARDIOVERSION N/A 08/12/2022   Procedure: TRANSESOPHAGEAL ECHOCARDIOGRAM;  Surgeon: Thomasene Ripple, DO;  Location: [facility] INVASIVE CV LAB;  Service: Cardiovascular;  Laterality: N/A;   TEE WITHOUT CARDIOVERSION N/A 09/30/2022   Procedure: TRANSESOPHAGEAL ECHOCARDIOGRAM;  Surgeon: Jodelle Red, MD;  Location: [facility] INVASIVE CV LAB;  Service: Cardiovascular;  Laterality: N/A;   TOTAL HIP REVISION Left 03/25/2021   Procedure: LEFT POSTERIOR TOTAL HIP  ZIMMER CABLES;  Surgeon: Durene Romans, MD;  Location: [facility] ORS;  Service: Orthopedics;  Laterality: Left;   Family History Family History  Problem Relation Age of Onset   Breast cancer Mother    Hypertension Father    Prostate cancer Father    Stroke Father    Diabetes Sister    Heart attack Sister    Heart attack Brother    Cancer Maternal Grandfather        not sure if it is colon or rectal    Heart attack Maternal Uncle     Rectal cancer Maternal Uncle    Esophageal cancer Neg Hx    Pancreatic cancer Neg Hx    Stomach cancer Neg Hx     Social History Social History   Tobacco Use   Smoking status: Former   Smokeless tobacco: Former    Quit date: 1997   Tobacco comments:    Former smoker 05/22/21  Vaping Use   Vaping status: Never Used  Substance Use Topics   Alcohol use: Yes    Alcohol/week: 2.0 standard drinks of alcohol    Types: 2 Standard drinks or equivalent per week    Comment: Occ   Drug use: No   Allergies Ciprofloxacin, Contrast media [iodinated contrast media], Crestor [rosuvastatin], Lipitor [atorvastatin], Nsaids, and Statins  Review of Systems Review of Systems  Physical Exam Vital Signs  I have reviewed the triage vital signs Temp 98.7 F (37.1 C) (Oral)   Physical Exam Constitutional:      Appearance: She is not toxic-appearing.  Cardiovascular:     Rate and Rhythm: Tachycardia present. Rhythm irregular.     Pulses: Normal pulses.  Pulmonary:     Effort: Pulmonary effort is normal.  Abdominal:     General: Abdomen is flat. There is no distension.     Palpations: Abdomen is soft.  Tenderness: There is no abdominal tenderness.  Musculoskeletal:        General: Normal range of motion.  Skin:    General: Skin is warm and dry.     Findings: No rash.  Neurological:     General: No focal deficit present.     Mental Status: She is alert.     Comments: Patient does seem a bit confused to me.  She is unable to recall why exactly she has not been taking Eliquis.  Patient has a difficult time telling a clear, linear story.  Unclear if this is baseline.     ED Results and Treatments Labs (all labs ordered are listed, but only abnormal results are displayed) Labs Reviewed  CULTURE, BLOOD (ROUTINE X 2)  CULTURE, BLOOD (ROUTINE X 2)  RESP PANEL BY RT-PCR (RSV, FLU A&B, COVID)  RVPGX2  COMPREHENSIVE METABOLIC PANEL  CBC WITH DIFFERENTIAL/PLATELET  PROTIME-INR  APTT   URINALYSIS, W/ REFLEX TO CULTURE (INFECTION SUSPECTED)  I-STAT CG4 LACTIC ACID, ED                                                                                                                          Radiology No results found.  Pertinent labs & imaging results that were available during my care of the patient were reviewed by me and considered in my medical decision making (see MDM for details).  Medications Ordered in ED Medications  diltiazem (CARDIZEM) injection 15 mg (has no administration in time range)                                                                                                                                     Procedures .Critical Care  Performed by: Arletha Pili, DO Authorized by: Arletha Pili, DO   Critical care provider statement:    Critical care time (minutes):  30   Critical care was necessary to treat or prevent imminent or life-threatening deterioration of the following conditions:  Circulatory failure and cardiac failure   Critical care was time spent personally by me on the following activities:  Development of treatment plan with patient or surrogate, discussions with consultants, evaluation of patient's response to treatment, examination of patient, ordering and review of laboratory studies, ordering and review of radiographic studies, ordering and performing treatments and interventions, pulse oximetry, re-evaluation of patient's condition and review of old charts   (  including critical care time)  Medical Decision Making / ED Course   This patient presents to the ED for concern of rapid heart rate, this involves an extensive number of treatment options, and is a complaint that carries with it a high risk of complications and morbidity.  The differential diagnosis includes atrial fibrillation, underlying infection, provoked A-fib, sepsis, CVA.  MDM: Patient not a candidate for cardioversion, as I do not know if she has been  taking her anticoagulation.  She has a previous ventricular thrombus.  Patient has a pacemaker.  Looking through her GI notes, does appear that she has had GI bleed in the past.  Patient seems a bit confused to me, not sure if this is her baseline, or if this is contributing to her atrial fibrillation.  Will check for underlying signs of infection.  Will obtain CT imaging of the head to assess for possibility of thrombus.  Patient's last known well was several days ago.  Reassessment 7:20 PM-patient CT head, per my independent review shows no acute ischemia.  Patient's chest x-ray, per my independent review shows pulmonary edema.  Have given the patient some Lasix.  Believe this is related to her recent atrial fibrillation.  Rate is now controlled after receiving IV diltiazem.  Looking at the rest of the patient's labs, mild hyperkalemia, should correct with furosemide.  Will admit to hospitalist for heart failure, atrial fibrillation.   Additional history obtained: -Additional history obtained from EMS -External records from outside source obtained and reviewed including: Chart review including previous notes, labs, imaging, consultation notes   Lab Tests: -I ordered, reviewed, and interpreted labs.   The pertinent results include:   Labs Reviewed  CULTURE, BLOOD (ROUTINE X 2)  CULTURE, BLOOD (ROUTINE X 2)  RESP PANEL BY RT-PCR (RSV, FLU A&B, COVID)  RVPGX2  COMPREHENSIVE METABOLIC PANEL  CBC WITH DIFFERENTIAL/PLATELET  PROTIME-INR  APTT  URINALYSIS, W/ REFLEX TO CULTURE (INFECTION SUSPECTED)  I-STAT CG4 LACTIC ACID, ED      EKG atrial fibrillation, rate in the 90s.  No acute ischemia.  EKG Interpretation Date/Time:    Ventricular Rate:    PR Interval:    QRS Duration:    QT Interval:    QTC Calculation:   R Axis:      Text Interpretation:           Imaging Studies ordered: I ordered imaging studies including chest x-ray I independently visualized and interpreted  imaging. I agree with the radiologist interpretation   Medicines ordered and prescription drug management: Meds ordered this encounter  Medications   diltiazem (CARDIZEM) injection 15 mg    -I have reviewed the patients home medicines and have made adjustments as needed  Critical interventions Rate control, atrial fibrillation with rapid ventricular rate  Consultations Obtained: I requested consultation with the hospitalist,  and discussed lab and imaging findings as well as pertinent plan - they recommend: Admission   Cardiac Monitoring: The patient was maintained on a cardiac monitor.  I personally viewed and interpreted the cardiac monitored which showed an underlying rhythm of: Atrial fibrillation  Social Determinants of Health:  Factors impacting patients care include:    Reevaluation: After the interventions noted above, I reevaluated the patient and found that they have :improved  Co morbidities that complicate the patient evaluation  Past Medical History:  Diagnosis Date   Acute ischemic stroke (HCC)    Acute metabolic encephalopathy    Acute on chronic systolic (congestive) heart failure (HCC)  Acute renal insufficiency 09/02/2016   Anemia due to stage 4 chronic kidney disease (HCC) 04/04/2015   Atrial fibrillation (HCC)    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 LAD: 0% and Normal. RCA:   Lesion on Mid RCA: Mid subsection.95% stenosis reduced to 0%. Pre procedure   TIMI II flow was noted. Post Procedure TIMI III flow was present. Poor run    off was present. The lesion was diagnosed as High Risk (C).    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   History of cardiac monitoring 02/23/2018   monitor inserted   History of iron deficiency anemia 12/28/2017   History of transesophageal echocardiography (TEE)    Hyperlipidemia    Hypertensive heart disease with heart failure (HCC)    Iron deficiency anemia 12/28/2017   LV dysfunction 09/02/2016   Myocardial infarction [facility])    NSTEMI (non-ST elevated myocardial infarction) (HCC) 08/06/2016   Obstructive sleep apnea 09/17/2014   Orthostatic hypotension 12/28/2017   OSA on CPAP    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    Final Clinical Impression(s) / ED Diagnoses Final diagnoses:  None     @PCDICTATION @

## 2022-12-06 NOTE — ED Notes (Signed)
URINARY OUTPUT APPROX. 

## 2022-12-06 NOTE — ED Notes (Signed)
WILL NEED BLADDER SCANNED WHEN ARRIVED IN UPSTAIRS BED. ED SCANNER NOT FUNCTION AT THIS TIME

## 2022-12-06 NOTE — Hospital Course (Addendum)
  Atrial fibrillation with RVR, not on anticoagulation History of cardioversion x 4, watchman procedure History of CVA (2018) Amanda [name] is a 82 y.o. 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 Found to have AFwRVR,got DCCV on 12/08/2022 and placed on Amiodarone 400 mg BID which is supposed to be continued until her AF ablation on December 16th. Patient converted back to Afib on 11/8 and had another cardioversion on 11/11.    Acute on chronic heart failure exacerbation (EF 55-60% on 8/28) CAD s/p STEMI with RCA DES placement (2018) Moderate-severe mitral regurgitation Severe tricuspid regurgitation Syncope w/ intermittent heart block s/p dual-chamber pacemaker (2021) Patient has a history of heart failure with preserved ejection fraction ,came in due to concerns for shortness of breath and found to have acute on chronic heart failure exacerbation.  Cardiology was consulted who recommended diuresing the patient on IV Lasix.  Patient received multiple doses of IV Lasix during this hospitalization and his urine output and kidney function was closely monitored.Pateint was switched to Torsemide 10 mg TID on 12/13/2022.  Hypercalcemia  Patient was found to be hypercalcemic during this admission. The gamma globulin regionon protein electrophoresis is  unremarkable and evidence of monoclonal protein is not apparent.  UPEP/UIFE/Light Chains/TP without M protein. PTH elevated. 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

## 2022-12-06 NOTE — ED Notes (Signed)
Doctors arrived at bedside

## 2022-12-06 NOTE — ED Triage Notes (Signed)
Pt BIB [facility] from home c/o not feeling well and being in a-fib x4 days. Pt denies any pain or SHOB.

## 2022-12-07 ENCOUNTER — Other Ambulatory Visit (HOSPITAL_COMMUNITY): Payer: Self-pay

## 2022-12-07 DIAGNOSIS — I4819 Other persistent atrial fibrillation: Secondary | ICD-10-CM | POA: Diagnosis present

## 2022-12-07 DIAGNOSIS — Z87891 Personal history of nicotine dependence: Secondary | ICD-10-CM | POA: Diagnosis not present

## 2022-12-07 DIAGNOSIS — Z881 Allergy status to other antibiotic agents status: Secondary | ICD-10-CM | POA: Diagnosis not present

## 2022-12-07 DIAGNOSIS — Z79899 Other long term (current) drug therapy: Secondary | ICD-10-CM | POA: Diagnosis not present

## 2022-12-07 DIAGNOSIS — I081 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: Secondary | ICD-10-CM | POA: Diagnosis present

## 2022-12-07 DIAGNOSIS — M109 Gout, unspecified: Secondary | ICD-10-CM | POA: Diagnosis present

## 2022-12-07 DIAGNOSIS — I5083 High output heart failure: Secondary | ICD-10-CM | POA: Diagnosis present

## 2022-12-07 DIAGNOSIS — I4891 Unspecified atrial fibrillation: Secondary | ICD-10-CM | POA: Diagnosis not present

## 2022-12-07 DIAGNOSIS — D6869 Other thrombophilia: Secondary | ICD-10-CM | POA: Diagnosis present

## 2022-12-07 DIAGNOSIS — E039 Hypothyroidism, unspecified: Secondary | ICD-10-CM | POA: Diagnosis present

## 2022-12-07 DIAGNOSIS — I4892 Unspecified atrial flutter: Secondary | ICD-10-CM | POA: Diagnosis not present

## 2022-12-07 DIAGNOSIS — E1122 Type 2 diabetes mellitus with diabetic chronic kidney disease: Secondary | ICD-10-CM | POA: Diagnosis present

## 2022-12-07 DIAGNOSIS — Z7901 Long term (current) use of anticoagulants: Secondary | ICD-10-CM | POA: Diagnosis not present

## 2022-12-07 DIAGNOSIS — N184 Chronic kidney disease, stage 4 (severe): Secondary | ICD-10-CM | POA: Diagnosis present

## 2022-12-07 DIAGNOSIS — E1165 Type 2 diabetes mellitus with hyperglycemia: Secondary | ICD-10-CM | POA: Diagnosis present

## 2022-12-07 DIAGNOSIS — I5033 Acute on chronic diastolic (congestive) heart failure: Secondary | ICD-10-CM | POA: Diagnosis present

## 2022-12-07 DIAGNOSIS — E875 Hyperkalemia: Secondary | ICD-10-CM | POA: Diagnosis present

## 2022-12-07 DIAGNOSIS — N179 Acute kidney failure, unspecified: Secondary | ICD-10-CM | POA: Diagnosis not present

## 2022-12-07 DIAGNOSIS — D509 Iron deficiency anemia, unspecified: Secondary | ICD-10-CM | POA: Diagnosis present

## 2022-12-07 DIAGNOSIS — E785 Hyperlipidemia, unspecified: Secondary | ICD-10-CM | POA: Diagnosis present

## 2022-12-07 DIAGNOSIS — I130 Hypertensive heart and chronic kidney disease with heart failure and stage 1 through stage 4 chronic kidney disease, or unspecified chronic kidney disease: Secondary | ICD-10-CM | POA: Diagnosis present

## 2022-12-07 DIAGNOSIS — I498 Other specified cardiac arrhythmias: Secondary | ICD-10-CM | POA: Diagnosis not present

## 2022-12-07 DIAGNOSIS — D631 Anemia in chronic kidney disease: Secondary | ICD-10-CM | POA: Diagnosis present

## 2022-12-07 DIAGNOSIS — I255 Ischemic cardiomyopathy: Secondary | ICD-10-CM | POA: Diagnosis present

## 2022-12-07 DIAGNOSIS — J9621 Acute and chronic respiratory failure with hypoxia: Secondary | ICD-10-CM | POA: Diagnosis present

## 2022-12-07 DIAGNOSIS — Z79890 Hormone replacement therapy (postmenopausal): Secondary | ICD-10-CM | POA: Diagnosis not present

## 2022-12-07 LAB — CBC
HCT: 26.8 % — ABNORMAL LOW (ref 36.0–46.0)
Hemoglobin: 8.2 g/dL — ABNORMAL LOW (ref 12.0–15.0)
MCH: 28 pg (ref 26.0–34.0)
MCHC: 30.6 g/dL (ref 30.0–36.0)
MCV: 91.5 fL (ref 80.0–100.0)
Platelets: 365 10*3/uL (ref 150–400)
RBC: 2.93 MIL/uL — ABNORMAL LOW (ref 3.87–5.11)
RDW: 16.5 % — ABNORMAL HIGH (ref 11.5–15.5)
WBC: 8.5 10*3/uL (ref 4.0–10.5)
nRBC: 0.2 % (ref 0.0–0.2)

## 2022-12-07 LAB — TROPONIN I (HIGH SENSITIVITY): Troponin I (High Sensitivity): 42 ng/L — ABNORMAL HIGH (ref <18)

## 2022-12-07 LAB — CBG MONITORING, ED
Glucose-Capillary: 163 mg/dL — ABNORMAL HIGH (ref 70–99)
Glucose-Capillary: 191 mg/dL — ABNORMAL HIGH (ref 70–99)

## 2022-12-07 LAB — COMPREHENSIVE METABOLIC PANEL
ALT: 32 U/L (ref 0–44)
AST: 24 U/L (ref 15–41)
Albumin: 2.3 g/dL — ABNORMAL LOW (ref 3.5–5.0)
Alkaline Phosphatase: 112 U/L (ref 38–126)
Anion gap: 9 (ref 5–15)
BUN: 43 mg/dL — ABNORMAL HIGH (ref 8–23)
CO2: 21 mmol/L — ABNORMAL LOW (ref 22–32)
Calcium: 10.4 mg/dL — ABNORMAL HIGH (ref 8.9–10.3)
Chloride: 107 mmol/L (ref 98–111)
Creatinine, Ser: 2.2 mg/dL — ABNORMAL HIGH (ref 0.44–1.00)
GFR, Estimated: 22 mL/min — ABNORMAL LOW (ref >60)
Glucose, Bld: 168 mg/dL — ABNORMAL HIGH (ref 70–99)
Potassium: 4.1 mmol/L (ref 3.5–5.1)
Sodium: 137 mmol/L (ref 135–145)
Total Bilirubin: 0.6 mg/dL (ref <1.2)
Total Protein: 6.1 g/dL — ABNORMAL LOW (ref 6.5–8.1)

## 2022-12-07 LAB — GLUCOSE, CAPILLARY
Glucose-Capillary: 178 mg/dL — ABNORMAL HIGH (ref 70–99)
Glucose-Capillary: 176 mg/dL — ABNORMAL HIGH (ref 70–99)
Glucose-Capillary: 213 mg/dL — ABNORMAL HIGH (ref 70–99)

## 2022-12-07 MED ORDER — SODIUM CHLORIDE 0.9 % IV SOLN: INTRAVENOUS | Status: DC

## 2022-12-07 MED ORDER — AMIODARONE HCL 200 MG PO TABS
400.0000 mg | ORAL_TABLET | Freq: Two times a day (BID) | ORAL | Status: DC
  Administered 2022-12-07 – 2022-12-15 (×17): 400 mg via ORAL
  Filled 2022-12-07 (×17): qty 2

## 2022-12-07 MED ORDER — ORAL CARE MOUTH RINSE: 15.0000 mL | OROMUCOSAL | Status: DC | PRN

## 2022-12-07 NOTE — H&P (View-Only) (Signed)
HD#0 SUBJECTIVE:  Patient Summary: Amanda [name] is a 82 y.o. with a pertinent PMH of A-fib status post cardioversion, CVA, CAD status post STEMI, not on any anticoagulation, who presented with shortness of breath and fatigue and admitted for atrial fibrillation with RVR.   Overnight Events: Admitted  Interim History: Patient was seen at bedside this morning is laying in bed comfortably with no acute distress, says she is doing much better compared to when she first came.  OBJECTIVE:  Vital Signs: Vitals:   12/07/22 0636 12/07/22 0700 12/07/22 1025 12/07/22 1037  BP:  136/87 (!) 156/123   Pulse: (!) 49 (!) 112 (!) 103   Resp: (!) 24 (!) 22 (!) 42   Temp:    97.8 F (36.6 C)  TempSrc:    Oral  SpO2: 95% 97% 98%    Supplemental O2: Room Air SpO2: 98 % O2 Flow Rate (L/min): 2 L/min  There were no vitals filed for this visit.  No intake or output data in the 24 hours ending 12/07/22 1237 Net IO Since Admission: No IO data has been entered for this period [12/07/22 1237]  Physical Exam: Physical Exam  Patient Lines/Drains/Airways Status     Active Line/Drains/Airways     Name Placement date Placement time Site Days   Peripheral IV 03/25/21 20 G Right Hand 03/25/21  1409  Hand  622   Peripheral IV 12/06/22 20 G 1" Left;Posterior Hand 12/06/22  1803  Hand  1   Wound / Incision (Open or Dehisced) 03/24/21 Skin tear Arm Lower;Posterior;Right red, bleeding 03/24/21  1930  Arm  623             ASSESSMENT/PLAN:  Assessment: Principal Problem:   Atrial fibrillation with RVR (HCC)   Plan: # Atrial fibrillation with RVR not on AC #History of CVA 718 #History of cardioversion x4 Patient has a history of atrial fibrillation on no anticoagulation, on home amiodarone 200 mg daily, found to have AFwRVR in the 120s on presentation.  Remains tachycardic.  Patient received Cardizem bolus and the emergency department when she initially came in. TSH is normal. CHADVASC score of  12.2%.  History of chronic kidney disease makes patient a bad candidate for Tikosyn. Cardiology consulted and they recommended increasing amiodarone to 400 mg twice daily for 2 weeks and then taper. Plan for DCCV tomorrow when volume status gets better. 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 Imaging, labs and exam consistent with heart failure, had 40 mg of Lasix. Will continue to gently diurese while monitoring urinary output and electrolytes. Will follow up on pending ECHO.  - Diuresis with IV laxis - F/U ECHO - Monitor in/out  - Daily weight - Continue Hydralazine 50 mg q6h and Imdur 60 mg daily  - Follow up outpatient on mitral regurgitation and severe tricuspid regurgitation per Cardiology  # Chronic conditions  . Type 2 diabetes - Continue on SSI, Daily CGM .  Hypertension -Continue on home amlodipine 5 mg .  Hyperlipidemia -Continue pravastatin 40 mg and Evocolcumab 140 mg injection  .  GERD - Continue Protonix 40 mg daily  and famotidine 40 mg daily .  Gout - Continue home Febuxostat 40 mg as needed .  CKD stage IV -Trend creatinine, avoid nephrotoxic agents .  Constipation - Continue Colace 100 mg as needed .  Osteoporosis - Patient takes denosumab 60 mg injection every 6 months, no changes needed at this time .  Anemia of chronic disease versus iron deficiency anemia - Trend CBC,continue iron sulphate    Best Practice: Diet: Cardiac diet VTE: apixaban (ELIQUIS) tablet 2.5 mg Start: 12/06/22 2200 Code: Full DISPO: Anticipated discharge in 3 days to Home pending   Medical work up and DCCV .  Signature: Kathleen Lime , MD Internal Medicine Resident, PGY-1 [facility]  Pager: [phone] 12:37 PM, 12/07/2022   Please contact the on call pager after 5 pm and on weekends at [phone].

## 2022-12-07 NOTE — TOC Benefit Eligibility Note (Signed)
Patient Product/process development scientist completed.    The patient is insured through [facility]. Patient has Medicare and is not eligible for a copay card, but may be able to apply for patient assistance, if available.    Ran test claim for Eliquis 2.5 mg and the current 30 day co-pay is $140.96 due to being in Coverage Gap (donut hole).   This test claim was processed through [facility]- copay amounts may vary at other pharmacies due to pharmacy/plan contracts, or as the patient moves through the different stages of their insurance plan.     Roland Earl, CPHT Pharmacy Technician III Certified Patient Advocate [facility] Patient Advocate Team Direct Number: [phone]  Fax: [phone]

## 2022-12-07 NOTE — Anesthesia Preprocedure Evaluation (Signed)
Anesthesia Evaluation  Patient identified by MRN, date of birth, ID band Patient awake    Reviewed: Allergy & Precautions, NPO status , Patient's Chart, lab work & pertinent test results  Airway Mallampati: II  TM Distance: >3 FB Neck ROM: Full    Dental no notable dental hx.    Pulmonary sleep apnea and Continuous Positive Airway Pressure Ventilation , former smoker   Pulmonary exam normal        Cardiovascular hypertension, Pt. on medications + CAD, + Past MI, + Cardiac Stents and +CHF  + dysrhythmias Atrial Fibrillation + pacemaker  Rhythm:Irregular Rate:Normal     Neuro/Psych CVA  negative psych ROS   GI/Hepatic Neg liver ROS,GERD  Medicated,,  Endo/Other  diabetes    Renal/GU CRFRenal disease     Musculoskeletal negative musculoskeletal ROS (+)    Abdominal Normal abdominal exam  (+)   Peds  Hematology  (+) Blood dyscrasia, anemia   Anesthesia Other Findings   Reproductive/Obstetrics                             Anesthesia Physical Anesthesia Plan  ASA: 3  Anesthesia Plan: General   Post-op Pain Management:    Induction: Intravenous  PONV Risk Score and Plan: 3 and Propofol infusion and Treatment may vary due to age or medical condition  Airway Management Planned: Mask  Additional Equipment: None  Intra-op Plan:   Post-operative Plan:   Informed Consent: I have reviewed the patients History and Physical, chart, labs and discussed the procedure including the risks, benefits and alternatives for the proposed anesthesia with the patient or authorized representative who has indicated his/her understanding and acceptance.     Dental advisory given  Plan Discussed with: CRNA  Anesthesia Plan Comments:        Anesthesia Quick Evaluation

## 2022-12-07 NOTE — H&P (View-Only) (Signed)
ELECTROPHYSIOLOGY CONSULT NOTE    Patient ID: Amanda [name] MRN: [id number], DOB/AGE: Jun 16, 1940 82 y.o.  Admit date: 12/06/2022 Date of Consult: 12/07/2022  Primary Physician: Street, Stephanie Coup, MD Primary Cardiologist: Gypsy Balsam, MD  Electrophysiologist: Dr. Elberta Fortis   Referring Provider: Dr. Heide Spark  Patient Profile: Amanda [name] is a 82 y.o. 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 Heide Spark.  HPI:  Amanda [name] is a 82 y.o. female who presented to [facility] ER on 12/06/22 with reports of rapid heart rate.    She had a DCCV in 09/2022 but had ERAF after 4 days. She was seen by Dr. Bing Matter in 10/2022 and amiodarone was decreased to 200mg  daily. Last seen by EP 11/16/22 and was in AF at that time. She was on amiodarone 200 mg daily at EP visit and planned for ablation 01/2023.   She reported on presentation that she felt as though she had a cough with "fever of 99" on Friday on over the last week but didn't feel particularly bad.  She was not taking Eliquis prior to admission.  She reports night time cough, feels as though she has to sit up to breath which is not new/worse.  She has noted increased fluid in her legs / abdomen recently.   She had worsening of SOB and presented 11/3 to the ER for evaluation. She was found to be in [facility] 120's on arrival. She was given 15mg  IV cardizem, 40 mg IV lasix. CXR images reviewed with increased interstitial edema, small pleural effusions, right diaphragmatic tenting, pacemaker in place. Initial labs notable for Sr Cr 2.2 (baseline ~1.8-2.2), Hgb 8.2.  She states she is aware of her AF when it is present, is fatigued when in AF.  CT head negative (ordered in setting of possible confusion). She was placed on 2L in ER for saturations 89% on arrival.  She denies chest pain, orthopnea, nausea, vomiting, dizziness, syncope, weight gain, or early satiety.   Labs Potassium4.1 (11/04 0019)   Creatinine, ser  2.20* (11/04 0019) PLT  365 (11/04 0019) HGB  8.2* (11/04 0019) WBC 8.5 (11/04 0019) Troponin I (High Sensitivity)42* (11/04 0019).    Past Medical History:  Diagnosis Date   Acute ischemic stroke (HCC)    Acute metabolic encephalopathy    Acute on chronic systolic (congestive) heart failure (HCC)    Acute renal insufficiency 09/02/2016   Anemia due to stage 4 chronic kidney disease (HCC) 04/04/2015   Atrial fibrillation (HCC)    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) 04/04/2015   Bradycardia 09/17/2014   Chest pain 06/23/2016   Overview:  Added automatically from request for surgery [id number]   Chronic anemia    Chronic kidney disease (CKD), stage III (moderate) (HCC)    Coronary artery disease 10/12/2016   Non drug-eluting stent implanted in June 2018 to mid RCA   08/08/2016 10:37  Angiographic Findings  Cardiac Arteries and Lesion Findings LMCA: 0% and Normal. LAD: 0% and Normal. RCA:   Lesion on Mid RCA: Mid subsection.95% stenosis reduced to 0%. Pre procedure   TIMI  II flow was noted. Post Procedure TIMI III flow was present. Poor run   off was present. The lesion was diagnosed as High Risk (C).    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   History of cardiac monitoring 02/23/2018   monitor inserted   History of iron deficiency anemia 12/28/2017   History of transesophageal  echocardiography (TEE)    Hyperlipidemia    Hypertensive heart disease with heart failure (HCC)    Iron deficiency anemia 12/28/2017   LV dysfunction 09/02/2016   Myocardial infarction [facility])    NSTEMI (non-ST elevated myocardial infarction) (HCC) 08/06/2016   Obstructive sleep apnea 09/17/2014   Orthostatic hypotension 12/28/2017   OSA on CPAP    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   Postural dizziness with presyncope 09/23/2017   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/24/2017   Recurrent left pleural effusion 03/14/2015   Renal failure, chronic, stage 3 (moderate) (HCC)    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   Syncope 11/04/2017   Systolic heart failure (HCC)    Thyroid disease    Vitamin D deficiency 04/04/2015     Surgical History:  Past Surgical History:  Procedure Laterality Date   BUBBLE STUDY  05/16/2020   Procedure: BUBBLE STUDY;  Surgeon: Parke Poisson, MD;  Location: [facility] ENDOSCOPY;  Service: Cardiovascular;;   CARDIAC CATHETERIZATION     CARDIOVERSION N/A 05/16/2020   Procedure: CARDIOVERSION;  Surgeon: Parke Poisson, MD;  Location: [facility] ENDOSCOPY;  Service: Cardiovascular;  Laterality: N/A;   CARDIOVERSION N/A 09/06/2020   Procedure: CARDIOVERSION;  Surgeon: Little Ishikawa, MD;  Location: [facility] ENDOSCOPY;  Service: Cardiovascular;  Laterality: N/A;   CARDIOVERSION N/A 05/16/2021   Procedure: CARDIOVERSION;  Surgeon: Quintella Reichert, MD;  Location: [facility] ENDOSCOPY;  Service: Cardiovascular;  Laterality: N/A;   CARDIOVERSION N/A 08/12/2022   Procedure: CARDIOVERSION;  Surgeon: Thomasene Ripple, DO;  Location: [facility] INVASIVE CV LAB;  Service: Cardiovascular;  Laterality: N/A;   CARDIOVERSION N/A 09/30/2022   Procedure: CARDIOVERSION;  Surgeon: Jodelle Red, MD;  Location: [facility] INVASIVE CV LAB;  Service: Cardiovascular;  Laterality: N/A;   COLONOSCOPY  11/21/2014   Moderate predominantly sigmoid diverticulosis. Otherwise noraml  collonscopy to TI.    CORONARY ANGIOPLASTY WITH STENT PLACEMENT  08/2016   EXCISIONAL HEMORRHOIDECTOMY     LEFT ATRIAL APPENDAGE OCCLUSION  11/2016   in [place]   LOOP RECORDER INSERTION N/A 02/23/2018   Procedure: LOOP RECORDER INSERTION;  Surgeon: Regan Lemming, MD;  Location: [facility] INVASIVE CV LAB;  Service: Cardiovascular;  Laterality: N/A;   LOOP RECORDER REMOVAL N/A 04/24/2019   Procedure: LOOP RECORDER REMOVAL;  Surgeon: Regan Lemming, MD;  Location: [facility] INVASIVE CV LAB;  Service: Cardiovascular;  Laterality: N/A;   NOSE SURGERY     PACEMAKER IMPLANT N/A 04/24/2019   Procedure: PACEMAKER IMPLANT;  Surgeon: Regan Lemming, MD;  Location: [facility] INVASIVE CV LAB;  Service: Cardiovascular;  Laterality: N/A;   TEE WITH CARDIOVERSION     TEE WITHOUT CARDIOVERSION N/A 05/16/2020   Procedure: TRANSESOPHAGEAL ECHOCARDIOGRAM (TEE);  Surgeon: Parke Poisson, MD;  Location: [facility] ENDOSCOPY;  Service: Cardiovascular;  Laterality: N/A;   TEE WITHOUT CARDIOVERSION N/A 08/12/2022   Procedure: TRANSESOPHAGEAL ECHOCARDIOGRAM;  Surgeon: Thomasene Ripple, DO;  Location:  [facility] INVASIVE CV LAB;  Service: Cardiovascular;  Laterality: N/A;   TEE WITHOUT CARDIOVERSION N/A 09/30/2022   Procedure: TRANSESOPHAGEAL ECHOCARDIOGRAM;  Surgeon: Jodelle Red, MD;  Location: [facility] INVASIVE CV LAB;  Service: Cardiovascular;  Laterality: N/A;   TOTAL HIP REVISION Left 03/25/2021   Procedure: LEFT POSTERIOR TOTAL HIP  ZIMMER CABLES;  Surgeon: Durene Romans, MD;  Location: [facility] ORS;  Service: Orthopedics;  Laterality: Left;     (Not in a hospital admission)   Inpatient Medications:   amiodarone  200 mg Oral Daily   amLODipine  5 mg Oral Daily   apixaban  2.5 mg Oral BID   cholecalciferol  2,000 Units Oral Q1200   cyanocobalamin  1,000 mcg Oral Daily   estradiol  1 Applicatorful Vaginal Q M,W,F   famotidine  10 mg Oral Daily   febuxostat  40 mg Oral QHS   feeding supplement (GLUCERNA SHAKE)  237 mL  Oral TID BM   ferrous sulfate  325 mg Oral Q breakfast   folic acid  1 mg Oral Q1200   furosemide  40 mg Intravenous BID   hydrALAZINE  50 mg Oral Q6H   insulin aspart  0-9 Units Subcutaneous TID WC   isosorbide mononitrate  60 mg Oral q morning   levothyroxine  50 mcg Oral QAC breakfast   omega-3 acid ethyl esters  2 capsule Oral Daily   pravastatin  40 mg Oral QHS    Allergies:  Allergies  Allergen Reactions   Ciprofloxacin Other (See Comments)    Achilles tendon pain   Contrast Media [Iodinated Contrast Media]     Avoid due to stage 4 kidney disease    Crestor [Rosuvastatin]     Joint pain   Lipitor [Atorvastatin]     Joint pain   Nsaids     Avoid due to stage 4 kidney disease    Statins Other (See Comments)    Joint pain, tolerates pravastatin     Family History  Problem Relation Age of Onset   Breast cancer Mother    Hypertension Father    Prostate cancer Father    Stroke Father    Diabetes Sister    Heart attack Sister    Heart attack Brother    Cancer Maternal Grandfather        not sure if it is colon or rectal    Heart attack Maternal Uncle    Rectal cancer Maternal Uncle    Stroke Maternal Uncle    Esophageal cancer Neg Hx    Pancreatic cancer Neg Hx    Stomach cancer Neg Hx      Physical Exam: Vitals:   12/07/22 0633 12/07/22 0636 12/07/22 0700 12/07/22 1025  BP:   136/87 (!) 156/123  Pulse: (!) 122 (!) 49 (!) 112 (!) 103  Resp: (!) 23 (!) 24 (!) 22 (!) 42  Temp:      TempSrc:      SpO2: (!) 83% 95% 97% 98%    GEN- pleasant elderly female in NAD, A&O x 3, normal affect HEENT: Normocephalic, atraumatic Lungs- normal effort at rest, lungs with bibasilar crackles  Heart- Irr Irr, AF on monitor 90-100's, SEM.  GI- Soft, NT, ND.  Extremities- No clubbing, cyanosis.  Trace to 1+ edema BLE, pigment changes to LLE   Radiology/Studies: CT Head Wo Contrast  Result Date: 12/06/2022 CLINICAL DATA:  The lower in. EXAM: CT HEAD WITHOUT CONTRAST  TECHNIQUE: Contiguous axial images were obtained from the base of the skull through  the vertex without intravenous contrast. RADIATION DOSE REDUCTION: This exam was performed according to the departmental dose-optimization program which includes automated exposure control, adjustment of the mA and/or kV according to patient size and/or use of iterative reconstruction technique. COMPARISON:  Head CT dated 05/03/2020. FINDINGS: Brain: Mild age-related atrophy and chronic microvascular ischemic changes. 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 There is no acute intracranial hemorrhage. No mass effect or midline shift. No extra-axial fluid collection. Vascular: No hyperdense vessel or unexpected calcification. Skull: Normal. Negative for fracture or focal lesion. Sinuses/Orbits: No acute finding. Other: None IMPRESSION: 1. No acute intracranial pathology. 2. Mild age-related atrophy and chronic microvascular ischemic changes. 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 Electronically Signed   By: Elgie Collard M.D.   On: 12/06/2022 17:54   DG Chest Port 1 View  Result Date: 12/06/2022 CLINICAL DATA:  Sepsis EXAM: PORTABLE CHEST 1 VIEW COMPARISON:  X-ray 03/30/2021 FINDINGS: Enlarged cardiopericardial silhouette. Calcified aorta. Increasing interstitial changes the lungs. Favor edema with vascular congestion rather than infiltrate. Recommend follow-up. Small pleural effusions. No pneumothorax. Left upper chest battery pack with leads along the right side of the heart for pacemaker. Overlapping cardiac leads. Atrial occlusion device. IMPRESSION: Pacemaker.  Enlarged heart. Developing interstitial changes seen of the lungs, favor edema. Small effusions. Recommend follow-up. Electronically Signed   By: Karen Kays M.D.   On: 12/06/2022 17:37   CUP PACEART INCLINIC DEVICE CHECK  Result Date: 11/16/2022 Full check not provided. See attachment for arrhythmia information.Jeannine Kitten, RN   EKG: AF  94 bpm (personally reviewed)  TELEMETRY: AF 90-100's currently, highest rates 120-130's (personally reviewed)  DEVICE HISTORY:  MDT PPM 04/2019 implanted for intermittent heart block  Assessment/Plan:  Atrial Fibrillation  CHA2DS2-VASc 9 / 12.2% annual CVA risk -limited options for rhythm control > her CrCl is 20 which would rule her out as a candidate for Tikosyn.  She has a hx of CAD ruling out flecainide. Post cardizem bolus in ER.  -she was on amiodarone prior to admission > increase to 400mg  BID x2 weeks, then taper -plan for DCCV when volume status improved, tentatively plan for 11/5  -NPO after MN 11/5 -AF ablation planned for 01/18/23  -lasix per primary   MDT PPM In Situ  Implanted 04/2019 for intermittent heart block.    Secondary Hypercoagulable State  -continue eliquis, started on admission  -monitor for bleeding with GIB hx   Moderate-Severe MR Severe TR  -volume status as above  -outpatient follow up  HTN  -well controlled   CKD III/IV -per primary   OSA  -CPAP at bedtime   For questions or updates, please contact [facility] Please consult [email or web address] for contact info under Cardiology/STEMI.  Signed, Canary Brim, MSN, APRN, NP-C, AGACNP-BC [facility] - Electrophysiology  12/07/2022, 10:34 AM  I have seen and examined this patient with Canary Brim.  Agree with above, note added to reflect my findings.  Patient presented to the hospital in atrial fibrillation with increasing shortness of breath and fatigue.  She has been in atrial fibrillation for some time and has upcoming ablation scheduled.  She has been more short of breath over the last few days and thus presented to the hospital.  She has been giving Lasix for diuresis.  GEN: Well nourished, well developed, in no acute distress  HEENT: normal  Neck: no JVD, carotid bruits, or masses Cardiac: irregular; no murmurs, rubs, or gallops,no edema  Respiratory:  crackles GI: soft,  nontender, nondistended, + BS MS:  no deformity or atrophy  Skin: warm and dry Neuro:  Strength and sensation are intact Psych: euthymic mood, full affect   1.  Persistent atrial fibrillation: Currently on amiodarone.  She is continuing to feel poorly with increasing shortness of breath.  Jeana Kersting increase her amiodarone to 400 mg twice daily and plan for cardioversion.  Lamyra Malcolm keep her on a higher dose of amiodarone until her ablation.  Marsean Elkhatib keep her n.p.o. after midnight for possible cardioversion if volume status is improved. Acute on chronic diastolic heart failure: Has significant volume overload.  Likely a combination of valvular heart disease and atrial fibrillation.  Gentle diuresis with CKD. Moderate to severe MR with TR: Likely contributing to volume overload.  Odaliz Mcqueary need to be reassessed once in sinus rhythm. CKD stage III-IV: Plan per primary team  Myrtle Haller M. Corayma Cashatt MD 12/07/2022 12:32 PM

## 2022-12-07 NOTE — ED Notes (Signed)
Patient assisted to the bedside commode.

## 2022-12-07 NOTE — Progress Notes (Signed)
Heart Failure Navigator Progress Note  Assessed for Heart & Vascular TOC clinic readiness.  Patient does not meet criteria due to [facility] appointment on 11/15.   Navigator will sign off at this time.  Roxy Horseman, RN, BSN [facility] Heart Failure Navigator Secure Chat Only

## 2022-12-07 NOTE — ED Notes (Signed)
Pt assisted to bedside commode and back to bed. No needs expressed at this time. Pt updated on plan of care.

## 2022-12-07 NOTE — Plan of Care (Signed)

## 2022-12-07 NOTE — Consult Note (Addendum)
ELECTROPHYSIOLOGY CONSULT NOTE    Patient ID: Amanda [name] MRN: [id number], DOB/AGE: Jun 16, 1940 82 y.o.  Admit date: 12/06/2022 Date of Consult: 12/07/2022  Primary Physician: Street, Stephanie Coup, MD Primary Cardiologist: Gypsy Balsam, MD  Electrophysiologist: Dr. Elberta Fortis   Referring Provider: Dr. Heide Spark  Patient Profile: Amanda [name] is a 82 y.o. 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 Heide Spark.  HPI:  Amanda [name] is a 82 y.o. female who presented to [facility] ER on 12/06/22 with reports of rapid heart rate.    She had a DCCV in 09/2022 but had ERAF after 4 days. She was seen by Dr. Bing Matter in 10/2022 and amiodarone was decreased to 200mg  daily. Last seen by EP 11/16/22 and was in AF at that time. She was on amiodarone 200 mg daily at EP visit and planned for ablation 01/2023.   She reported on presentation that she felt as though she had a cough with "fever of 99" on Friday on over the last week but didn't feel particularly bad.  She was not taking Eliquis prior to admission.  She reports night time cough, feels as though she has to sit up to breath which is not new/worse.  She has noted increased fluid in her legs / abdomen recently.   She had worsening of SOB and presented 11/3 to the ER for evaluation. She was found to be in [facility] 120's on arrival. She was given 15mg  IV cardizem, 40 mg IV lasix. CXR images reviewed with increased interstitial edema, small pleural effusions, right diaphragmatic tenting, pacemaker in place. Initial labs notable for Sr Cr 2.2 (baseline ~1.8-2.2), Hgb 8.2.  She states she is aware of her AF when it is present, is fatigued when in AF.  CT head negative (ordered in setting of possible confusion). She was placed on 2L in ER for saturations 89% on arrival.  She denies chest pain, orthopnea, nausea, vomiting, dizziness, syncope, weight gain, or early satiety.   Labs Potassium4.1 (11/04 0019)   Creatinine, ser  2.20* (11/04 0019) PLT  365 (11/04 0019) HGB  8.2* (11/04 0019) WBC 8.5 (11/04 0019) Troponin I (High Sensitivity)42* (11/04 0019).    Past Medical History:  Diagnosis Date   Acute ischemic stroke (HCC)    Acute metabolic encephalopathy    Acute on chronic systolic (congestive) heart failure (HCC)    Acute renal insufficiency 09/02/2016   Anemia due to stage 4 chronic kidney disease (HCC) 04/04/2015   Atrial fibrillation (HCC)    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) 04/04/2015   Bradycardia 09/17/2014   Chest pain 06/23/2016   Overview:  Added automatically from request for surgery [id number]   Chronic anemia    Chronic kidney disease (CKD), stage III (moderate) (HCC)    Coronary artery disease 10/12/2016   Non drug-eluting stent implanted in June 2018 to mid RCA   08/08/2016 10:37  Angiographic Findings  Cardiac Arteries and Lesion Findings LMCA: 0% and Normal. LAD: 0% and Normal. RCA:   Lesion on Mid RCA: Mid subsection.95% stenosis reduced to 0%. Pre procedure   TIMI  II flow was noted. Post Procedure TIMI III flow was present. Poor run   off was present. The lesion was diagnosed as High Risk (C).    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   History of cardiac monitoring 02/23/2018   monitor inserted   History of iron deficiency anemia 12/28/2017   History of transesophageal  echocardiography (TEE)    Hyperlipidemia    Hypertensive heart disease with heart failure (HCC)    Iron deficiency anemia 12/28/2017   LV dysfunction 09/02/2016   Myocardial infarction [facility])    NSTEMI (non-ST elevated myocardial infarction) (HCC) 08/06/2016   Obstructive sleep apnea 09/17/2014   Orthostatic hypotension 12/28/2017   OSA on CPAP    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   Postural dizziness with presyncope 09/23/2017   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/24/2017   Recurrent left pleural effusion 03/14/2015   Renal failure, chronic, stage 3 (moderate) (HCC)    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   Syncope 11/04/2017   Systolic heart failure (HCC)    Thyroid disease    Vitamin D deficiency 04/04/2015     Surgical History:  Past Surgical History:  Procedure Laterality Date   BUBBLE STUDY  05/16/2020   Procedure: BUBBLE STUDY;  Surgeon: Parke Poisson, MD;  Location: [facility] ENDOSCOPY;  Service: Cardiovascular;;   CARDIAC CATHETERIZATION     CARDIOVERSION N/A 05/16/2020   Procedure: CARDIOVERSION;  Surgeon: Parke Poisson, MD;  Location: [facility] ENDOSCOPY;  Service: Cardiovascular;  Laterality: N/A;   CARDIOVERSION N/A 09/06/2020   Procedure: CARDIOVERSION;  Surgeon: Little Ishikawa, MD;  Location: [facility] ENDOSCOPY;  Service: Cardiovascular;  Laterality: N/A;   CARDIOVERSION N/A 05/16/2021   Procedure: CARDIOVERSION;  Surgeon: Quintella Reichert, MD;  Location: [facility] ENDOSCOPY;  Service: Cardiovascular;  Laterality: N/A;   CARDIOVERSION N/A 08/12/2022   Procedure: CARDIOVERSION;  Surgeon: Thomasene Ripple, DO;  Location: [facility] INVASIVE CV LAB;  Service: Cardiovascular;  Laterality: N/A;   CARDIOVERSION N/A 09/30/2022   Procedure: CARDIOVERSION;  Surgeon: Jodelle Red, MD;  Location: [facility] INVASIVE CV LAB;  Service: Cardiovascular;  Laterality: N/A;   COLONOSCOPY  11/21/2014   Moderate predominantly sigmoid diverticulosis. Otherwise noraml  collonscopy to TI.    CORONARY ANGIOPLASTY WITH STENT PLACEMENT  08/2016   EXCISIONAL HEMORRHOIDECTOMY     LEFT ATRIAL APPENDAGE OCCLUSION  11/2016   in [place]   LOOP RECORDER INSERTION N/A 02/23/2018   Procedure: LOOP RECORDER INSERTION;  Surgeon: Regan Lemming, MD;  Location: [facility] INVASIVE CV LAB;  Service: Cardiovascular;  Laterality: N/A;   LOOP RECORDER REMOVAL N/A 04/24/2019   Procedure: LOOP RECORDER REMOVAL;  Surgeon: Regan Lemming, MD;  Location: [facility] INVASIVE CV LAB;  Service: Cardiovascular;  Laterality: N/A;   NOSE SURGERY     PACEMAKER IMPLANT N/A 04/24/2019   Procedure: PACEMAKER IMPLANT;  Surgeon: Regan Lemming, MD;  Location: [facility] INVASIVE CV LAB;  Service: Cardiovascular;  Laterality: N/A;   TEE WITH CARDIOVERSION     TEE WITHOUT CARDIOVERSION N/A 05/16/2020   Procedure: TRANSESOPHAGEAL ECHOCARDIOGRAM (TEE);  Surgeon: Parke Poisson, MD;  Location: [facility] ENDOSCOPY;  Service: Cardiovascular;  Laterality: N/A;   TEE WITHOUT CARDIOVERSION N/A 08/12/2022   Procedure: TRANSESOPHAGEAL ECHOCARDIOGRAM;  Surgeon: Thomasene Ripple, DO;  Location:  [facility] INVASIVE CV LAB;  Service: Cardiovascular;  Laterality: N/A;   TEE WITHOUT CARDIOVERSION N/A 09/30/2022   Procedure: TRANSESOPHAGEAL ECHOCARDIOGRAM;  Surgeon: Jodelle Red, MD;  Location: [facility] INVASIVE CV LAB;  Service: Cardiovascular;  Laterality: N/A;   TOTAL HIP REVISION Left 03/25/2021   Procedure: LEFT POSTERIOR TOTAL HIP  ZIMMER CABLES;  Surgeon: Durene Romans, MD;  Location: [facility] ORS;  Service: Orthopedics;  Laterality: Left;     (Not in a hospital admission)   Inpatient Medications:   amiodarone  200 mg Oral Daily   amLODipine  5 mg Oral Daily   apixaban  2.5 mg Oral BID   cholecalciferol  2,000 Units Oral Q1200   cyanocobalamin  1,000 mcg Oral Daily   estradiol  1 Applicatorful Vaginal Q M,W,F   famotidine  10 mg Oral Daily   febuxostat  40 mg Oral QHS   feeding supplement (GLUCERNA SHAKE)  237 mL  Oral TID BM   ferrous sulfate  325 mg Oral Q breakfast   folic acid  1 mg Oral Q1200   furosemide  40 mg Intravenous BID   hydrALAZINE  50 mg Oral Q6H   insulin aspart  0-9 Units Subcutaneous TID WC   isosorbide mononitrate  60 mg Oral q morning   levothyroxine  50 mcg Oral QAC breakfast   omega-3 acid ethyl esters  2 capsule Oral Daily   pravastatin  40 mg Oral QHS    Allergies:  Allergies  Allergen Reactions   Ciprofloxacin Other (See Comments)    Achilles tendon pain   Contrast Media [Iodinated Contrast Media]     Avoid due to stage 4 kidney disease    Crestor [Rosuvastatin]     Joint pain   Lipitor [Atorvastatin]     Joint pain   Nsaids     Avoid due to stage 4 kidney disease    Statins Other (See Comments)    Joint pain, tolerates pravastatin     Family History  Problem Relation Age of Onset   Breast cancer Mother    Hypertension Father    Prostate cancer Father    Stroke Father    Diabetes Sister    Heart attack Sister    Heart attack Brother    Cancer Maternal Grandfather        not sure if it is colon or rectal    Heart attack Maternal Uncle    Rectal cancer Maternal Uncle    Stroke Maternal Uncle    Esophageal cancer Neg Hx    Pancreatic cancer Neg Hx    Stomach cancer Neg Hx      Physical Exam: Vitals:   12/07/22 0633 12/07/22 0636 12/07/22 0700 12/07/22 1025  BP:   136/87 (!) 156/123  Pulse: (!) 122 (!) 49 (!) 112 (!) 103  Resp: (!) 23 (!) 24 (!) 22 (!) 42  Temp:      TempSrc:      SpO2: (!) 83% 95% 97% 98%    GEN- pleasant elderly female in NAD, A&O x 3, normal affect HEENT: Normocephalic, atraumatic Lungs- normal effort at rest, lungs with bibasilar crackles  Heart- Irr Irr, AF on monitor 90-100's, SEM.  GI- Soft, NT, ND.  Extremities- No clubbing, cyanosis.  Trace to 1+ edema BLE, pigment changes to LLE   Radiology/Studies: CT Head Wo Contrast  Result Date: 12/06/2022 CLINICAL DATA:  The lower in. EXAM: CT HEAD WITHOUT CONTRAST  TECHNIQUE: Contiguous axial images were obtained from the base of the skull through  the vertex without intravenous contrast. RADIATION DOSE REDUCTION: This exam was performed according to the departmental dose-optimization program which includes automated exposure control, adjustment of the mA and/or kV according to patient size and/or use of iterative reconstruction technique. COMPARISON:  Head CT dated 05/03/2020. FINDINGS: Brain: Mild age-related atrophy and chronic microvascular ischemic changes. 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 There is no acute intracranial hemorrhage. No mass effect or midline shift. No extra-axial fluid collection. Vascular: No hyperdense vessel or unexpected calcification. Skull: Normal. Negative for fracture or focal lesion. Sinuses/Orbits: No acute finding. Other: None IMPRESSION: 1. No acute intracranial pathology. 2. Mild age-related atrophy and chronic microvascular ischemic changes. 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 Electronically Signed   By: Elgie Collard M.D.   On: 12/06/2022 17:54   DG Chest Port 1 View  Result Date: 12/06/2022 CLINICAL DATA:  Sepsis EXAM: PORTABLE CHEST 1 VIEW COMPARISON:  X-ray 03/30/2021 FINDINGS: Enlarged cardiopericardial silhouette. Calcified aorta. Increasing interstitial changes the lungs. Favor edema with vascular congestion rather than infiltrate. Recommend follow-up. Small pleural effusions. No pneumothorax. Left upper chest battery pack with leads along the right side of the heart for pacemaker. Overlapping cardiac leads. Atrial occlusion device. IMPRESSION: Pacemaker.  Enlarged heart. Developing interstitial changes seen of the lungs, favor edema. Small effusions. Recommend follow-up. Electronically Signed   By: Karen Kays M.D.   On: 12/06/2022 17:37   CUP PACEART INCLINIC DEVICE CHECK  Result Date: 11/16/2022 Full check not provided. See attachment for arrhythmia information.Jeannine Kitten, RN   EKG: AF  94 bpm (personally reviewed)  TELEMETRY: AF 90-100's currently, highest rates 120-130's (personally reviewed)  DEVICE HISTORY:  MDT PPM 04/2019 implanted for intermittent heart block  Assessment/Plan:  Atrial Fibrillation  CHA2DS2-VASc 9 / 12.2% annual CVA risk -limited options for rhythm control > her CrCl is 20 which would rule her out as a candidate for Tikosyn.  She has a hx of CAD ruling out flecainide. Post cardizem bolus in ER.  -she was on amiodarone prior to admission > increase to 400mg  BID x2 weeks, then taper -plan for DCCV when volume status improved, tentatively plan for 11/5  -NPO after MN 11/5 -AF ablation planned for 01/18/23  -lasix per primary   MDT PPM In Situ  Implanted 04/2019 for intermittent heart block.    Secondary Hypercoagulable State  -continue eliquis, started on admission  -monitor for bleeding with GIB hx   Moderate-Severe MR Severe TR  -volume status as above  -outpatient follow up  HTN  -well controlled   CKD III/IV -per primary   OSA  -CPAP at bedtime   For questions or updates, please contact [facility] Please consult [email or web address] for contact info under Cardiology/STEMI.  Signed, Canary Brim, MSN, APRN, NP-C, AGACNP-BC [facility] - Electrophysiology  12/07/2022, 10:34 AM  I have seen and examined this patient with Canary Brim.  Agree with above, note added to reflect my findings.  Patient presented to the hospital in atrial fibrillation with increasing shortness of breath and fatigue.  She has been in atrial fibrillation for some time and has upcoming ablation scheduled.  She has been more short of breath over the last few days and thus presented to the hospital.  She has been giving Lasix for diuresis.  GEN: Well nourished, well developed, in no acute distress  HEENT: normal  Neck: no JVD, carotid bruits, or masses Cardiac: irregular; no murmurs, rubs, or gallops,no edema  Respiratory:  crackles GI: soft,  nontender, nondistended, + BS MS:  no deformity or atrophy  Skin: warm and dry Neuro:  Strength and sensation are intact Psych: euthymic mood, full affect   1.  Persistent atrial fibrillation: Currently on amiodarone.  She is continuing to feel poorly with increasing shortness of breath.  Jeana Kersting increase her amiodarone to 400 mg twice daily and plan for cardioversion.  Lamyra Malcolm keep her on a higher dose of amiodarone until her ablation.  Marsean Elkhatib keep her n.p.o. after midnight for possible cardioversion if volume status is improved. Acute on chronic diastolic heart failure: Has significant volume overload.  Likely a combination of valvular heart disease and atrial fibrillation.  Gentle diuresis with CKD. Moderate to severe MR with TR: Likely contributing to volume overload.  Odaliz Mcqueary need to be reassessed once in sinus rhythm. CKD stage III-IV: Plan per primary team  Myrtle Haller M. Corayma Cashatt MD 12/07/2022 12:32 PM

## 2022-12-07 NOTE — Progress Notes (Addendum)
HD#0 SUBJECTIVE:  Patient Summary: Amanda [name] is a 82 y.o. with a pertinent PMH of A-fib status post cardioversion, CVA, CAD status post STEMI, not on any anticoagulation, who presented with shortness of breath and fatigue and admitted for atrial fibrillation with RVR.   Overnight Events: Admitted  Interim History: Patient was seen at bedside this morning is laying in bed comfortably with no acute distress, says she is doing much better compared to when she first came.  OBJECTIVE:  Vital Signs: Vitals:   12/07/22 0636 12/07/22 0700 12/07/22 1025 12/07/22 1037  BP:  136/87 (!) 156/123   Pulse: (!) 49 (!) 112 (!) 103   Resp: (!) 24 (!) 22 (!) 42   Temp:    97.8 F (36.6 C)  TempSrc:    Oral  SpO2: 95% 97% 98%    Supplemental O2: Room Air SpO2: 98 % O2 Flow Rate (L/min): 2 L/min  There were no vitals filed for this visit.  No intake or output data in the 24 hours ending 12/07/22 1237 Net IO Since Admission: No IO data has been entered for this period [12/07/22 1237]  Physical Exam: Physical Exam  Patient Lines/Drains/Airways Status     Active Line/Drains/Airways     Name Placement date Placement time Site Days   Peripheral IV 03/25/21 20 G Right Hand 03/25/21  1409  Hand  622   Peripheral IV 12/06/22 20 G 1" Left;Posterior Hand 12/06/22  1803  Hand  1   Wound / Incision (Open or Dehisced) 03/24/21 Skin tear Arm Lower;Posterior;Right red, bleeding 03/24/21  1930  Arm  623             ASSESSMENT/PLAN:  Assessment: Principal Problem:   Atrial fibrillation with RVR (HCC)   Plan: # Atrial fibrillation with RVR not on AC #History of CVA 718 #History of cardioversion x4 Patient has a history of atrial fibrillation on no anticoagulation, on home amiodarone 200 mg daily, found to have AFwRVR in the 120s on presentation.  Remains tachycardic.  Patient received Cardizem bolus and the emergency department when she initially came in. TSH is normal. CHADVASC score of  12.2%.  History of chronic kidney disease makes patient a bad candidate for Tikosyn. Cardiology consulted and they recommended increasing amiodarone to 400 mg twice daily for 2 weeks and then taper. Plan for DCCV tomorrow when volume status gets better. 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 Imaging, labs and exam consistent with heart failure, had 40 mg of Lasix. Will continue to gently diurese while monitoring urinary output and electrolytes. Will follow up on pending ECHO.  - Diuresis with IV laxis - F/U ECHO - Monitor in/out  - Daily weight - Continue Hydralazine 50 mg q6h and Imdur 60 mg daily  - Follow up outpatient on mitral regurgitation and severe tricuspid regurgitation per Cardiology  # Chronic conditions  . Type 2 diabetes - Continue on SSI, Daily CGM .  Hypertension -Continue on home amlodipine 5 mg .  Hyperlipidemia -Continue pravastatin 40 mg and Evocolcumab 140 mg injection  .  GERD - Continue Protonix 40 mg daily  and famotidine 40 mg daily .  Gout - Continue home Febuxostat 40 mg as needed .  CKD stage IV -Trend creatinine, avoid nephrotoxic agents .  Constipation - Continue Colace 100 mg as needed .  Osteoporosis - Patient takes denosumab 60 mg injection every 6 months, no changes needed at this time .  Anemia of chronic disease versus iron deficiency anemia - Trend CBC,continue iron sulphate    Best Practice: Diet: Cardiac diet VTE: apixaban (ELIQUIS) tablet 2.5 mg Start: 12/06/22 2200 Code: Full DISPO: Anticipated discharge in 3 days to Home pending   Medical work up and DCCV .  Signature: Kathleen Lime , MD Internal Medicine Resident, PGY-1 [facility]  Pager: [phone] 12:37 PM, 12/07/2022   Please contact the on call pager after 5 pm and on weekends at [phone].

## 2022-12-07 NOTE — ED Notes (Signed)
Attempted to wean patient to room air, pts SpO2 noted to be 83% on room air while awake and resting in bed. Pt placed back on 2L [place].

## 2022-12-07 NOTE — Evaluation (Signed)
Physical Therapy Evaluation Patient Details Name: Amanda [name] MRN: [id number] DOB: 05/29/1940 Today's Date: 12/07/2022  History of Present Illness  82 y.o. female presents to [facility] hospital on 12/06/2022 with SOB in the setting of afib. PMH includes afib, GIB, CVA, STEMI, severe TR, biventricular PPM, DMII, HTN, HL, GERD, ischemic colitis.  Clinical Impression  Pt presents to PT with deficits in endurance, power, cardiopulmonary function. Pt fatigues quickly, tolerating short household distances of ambulation and reporting DOE when mobilizing on room air. Pt will benefit from frequent mobilization in an effort to improve activity tolerance. If endurance deficits persist the pt may benefit from assessment of mobility with a rollator to allow for energy conservation. PT will continue to follow.        If plan is discharge home, recommend the following: Assistance with cooking/housework;Help with stairs or ramp for entrance   Can travel by private vehicle        Equipment Recommendations None recommended by PT (may benefit from rollator if endurance deficits persist)  Recommendations for Other Services       Functional Status Assessment Patient has had a recent decline in their functional status and demonstrates the ability to make significant improvements in function in a reasonable and predictable amount of time.     Precautions / Restrictions Precautions Precautions: Fall Precaution Comments: monitor sats Restrictions Weight Bearing Restrictions: No      Mobility  Bed Mobility Overal bed mobility: Modified Independent                  Transfers Overall transfer level: Needs assistance Equipment used: Straight cane Transfers: Sit to/from Stand Sit to Stand: Supervision                Ambulation/Gait Ambulation/Gait assistance: Supervision Gait Distance (Feet): 100 Feet Assistive device: Straight cane Gait Pattern/deviations: Step-through pattern Gait  velocity: reduced Gait velocity interpretation: <1.8 ft/sec, indicate of risk for recurrent falls   General Gait Details: slowed step-through gait  Stairs            Wheelchair Mobility     Tilt Bed    Modified Rankin (Stroke Patients Only)       Balance Overall balance assessment: Needs assistance Sitting-balance support: No upper extremity supported, Feet supported Sitting balance-Leahy Scale: Good     Standing balance support: Single extremity supported, Reliant on assistive device for balance Standing balance-Leahy Scale: Poor                               Pertinent Vitals/Pain Pain Assessment Pain Assessment: No/denies pain    Home Living Family/patient expects to be discharged to:: Private residence Living Arrangements: Spouse/significant other (daughter lives next door) Available Help at Discharge: Family;Available 24 hours/day Type of Home: House Home Access: Stairs to enter Entrance Stairs-Rails: Right Entrance Stairs-Number of Steps: 4   Home Layout: Able to live on main level with bedroom/bathroom Home Equipment: Rolling Walker (2 wheels);Cane - single point;Shower seat      Prior Function Prior Level of Function : Independent/Modified Independent             Mobility Comments: ambulating with support of SPC recently       Extremity/Trunk Assessment   Upper Extremity Assessment Upper Extremity Assessment: Overall WFL for tasks assessed    Lower Extremity Assessment Lower Extremity Assessment: Generalized weakness    Cervical / Trunk Assessment Cervical / Trunk Assessment: Kyphotic  Communication  Communication Communication: No apparent difficulties Cueing Techniques: Verbal cues  Cognition Arousal: Alert Behavior During Therapy: WFL for tasks assessed/performed Overall Cognitive Status: Within Functional Limits for tasks assessed                                          General Comments  General comments (skin integrity, edema, etc.): pt on 2L [place] upon PT arrival, PT weans to room air for mobility. Pt desats to low 80s when mobilizing on room air, reporting. PT places pt back on 2L [place] with quick recovery to sats in mid 90s. Pt noted to be in afib, rate in low 100s at rest and up to 130 with activity.    Exercises     Assessment/Plan    PT Assessment Patient needs continued PT services  PT Problem List Decreased activity tolerance;Decreased mobility;Cardiopulmonary status limiting activity       PT Treatment Interventions DME instruction;Gait training;Stair training;Functional mobility training;Therapeutic activities;Therapeutic exercise;Balance training;Neuromuscular re-education;Patient/family education    PT Goals (Current goals can be found in the Care Plan section)  Acute Rehab PT Goals Patient Stated Goal: to improve activity tolerance PT Goal Formulation: With patient Time For Goal Achievement: 12/21/22 Potential to Achieve Goals: Good Additional Goals Additional Goal #1: Pt will report 1/4 or less DOE when ambulating for >100' to demonstrate improved activity tolerance    Frequency Min 1X/week     Co-evaluation               AM-PAC PT "6 Clicks" Mobility  Outcome Measure Help needed turning from your back to your side while in a flat bed without using bedrails?: None Help needed moving from lying on your back to sitting on the side of a flat bed without using bedrails?: None Help needed moving to and from a bed to a chair (including a wheelchair)?: A Little Help needed standing up from a chair using your arms (e.g., wheelchair or bedside chair)?: A Little Help needed to walk in hospital room?: A Little Help needed climbing 3-5 steps with a railing? : A Little 6 Click Score: 20    End of Session   Activity Tolerance: Patient limited by fatigue Patient left: in bed;with call bell/phone within reach Nurse Communication: Mobility status PT Visit  Diagnosis: Other abnormalities of gait and mobility (R26.89)    Time: 5784-6962 PT Time Calculation (min) (ACUTE ONLY): 16 min   Charges:   PT Evaluation $PT Eval Low Complexity: 1 Low   PT General Charges $$ ACUTE PT VISIT: 1 Visit         Arlyss Gandy, PT, DPT Acute Rehabilitation Office [phone]   Arlyss Gandy 12/07/2022, 3:26 PM

## 2022-12-08 ENCOUNTER — Other Ambulatory Visit: Payer: Self-pay | Admitting: Cardiology

## 2022-12-08 ENCOUNTER — Inpatient Hospital Stay (HOSPITAL_COMMUNITY): Payer: Medicare Other | Admitting: Anesthesiology

## 2022-12-08 ENCOUNTER — Inpatient Hospital Stay (HOSPITAL_COMMUNITY)
Admission: EM | Disposition: A | Discharge status: Home Health | Payer: Self-pay | Source: Home / Self Care | Attending: Internal Medicine

## 2022-12-08 ENCOUNTER — Other Ambulatory Visit (HOSPITAL_COMMUNITY): Payer: Self-pay

## 2022-12-08 ENCOUNTER — Encounter (HOSPITAL_COMMUNITY): Payer: Self-pay | Admitting: Internal Medicine

## 2022-12-08 DIAGNOSIS — I4891 Unspecified atrial fibrillation: Secondary | ICD-10-CM

## 2022-12-08 HISTORY — PX: CARDIOVERSION: EP1203

## 2022-12-08 LAB — COMPREHENSIVE METABOLIC PANEL
ALT: 29 U/L (ref 0–44)
AST: 26 U/L (ref 15–41)
Albumin: 2.1 g/dL — ABNORMAL LOW (ref 3.5–5.0)
Alkaline Phosphatase: 94 U/L (ref 38–126)
Anion gap: 11 (ref 5–15)
BUN: 41 mg/dL — ABNORMAL HIGH (ref 8–23)
CO2: 21 mmol/L — ABNORMAL LOW (ref 22–32)
Calcium: 10.5 mg/dL — ABNORMAL HIGH (ref 8.9–10.3)
Chloride: 108 mmol/L (ref 98–111)
Creatinine, Ser: 2.1 mg/dL — ABNORMAL HIGH (ref 0.44–1.00)
GFR, Estimated: 23 mL/min — ABNORMAL LOW (ref >60)
Glucose, Bld: 168 mg/dL — ABNORMAL HIGH (ref 70–99)
Potassium: 4.1 mmol/L (ref 3.5–5.1)
Sodium: 140 mmol/L (ref 135–145)
Total Bilirubin: 0.7 mg/dL (ref <1.2)
Total Protein: 5.8 g/dL — ABNORMAL LOW (ref 6.5–8.1)

## 2022-12-08 LAB — GLUCOSE, CAPILLARY
Glucose-Capillary: 161 mg/dL — ABNORMAL HIGH (ref 70–99)
Glucose-Capillary: 157 mg/dL — ABNORMAL HIGH (ref 70–99)
Glucose-Capillary: 164 mg/dL — ABNORMAL HIGH (ref 70–99)
Glucose-Capillary: 175 mg/dL — ABNORMAL HIGH (ref 70–99)
Glucose-Capillary: 159 mg/dL — ABNORMAL HIGH (ref 70–99)

## 2022-12-08 LAB — CBC
HCT: 26.4 % — ABNORMAL LOW (ref 36.0–46.0)
Hemoglobin: 8 g/dL — ABNORMAL LOW (ref 12.0–15.0)
MCH: 27.8 pg (ref 26.0–34.0)
MCHC: 30.3 g/dL (ref 30.0–36.0)
MCV: 91.7 fL (ref 80.0–100.0)
Platelets: 386 10*3/uL (ref 150–400)
RBC: 2.88 MIL/uL — ABNORMAL LOW (ref 3.87–5.11)
RDW: 16.5 % — ABNORMAL HIGH (ref 11.5–15.5)
WBC: 6.7 10*3/uL (ref 4.0–10.5)
nRBC: 0.5 % — ABNORMAL HIGH (ref 0.0–0.2)

## 2022-12-08 SURGERY — CARDIOVERSION (CATH LAB)
Anesthesia: General

## 2022-12-08 MED ORDER — PROPOFOL 10 MG/ML IV BOLUS
INTRAVENOUS | Status: DC | PRN
  Administered 2022-12-08: 60 mg via INTRAVENOUS

## 2022-12-08 MED ORDER — LIDOCAINE 2% (20 MG/ML) 5 ML SYRINGE
INTRAMUSCULAR | Status: DC | PRN
  Administered 2022-12-08: 60 mg via INTRAVENOUS

## 2022-12-08 MED ORDER — APIXABAN 5 MG PO TABS
ORAL_TABLET | ORAL | Status: AC
  Filled 2022-12-08: qty 1

## 2022-12-08 SURGICAL SUPPLY — 1 items: PAD DEFIB RADIO PHYSIO CONN (PAD) ×1 IMPLANT

## 2022-12-08 NOTE — Interval H&P Note (Signed)
History and Physical Interval Note:  12/08/2022 7:44 AM  Amanda [name]  has presented today for surgery, with the diagnosis of atrial fibrillation.  The various methods of treatment have been discussed with the patient and family. After consideration of risks, benefits and other options for treatment, the patient has consented to  Procedure(s): CARDIOVERSION (CATH LAB) (N/A) as a surgical intervention.  The patient's history has been reviewed, patient examined, no change in status, stable for surgery.  I have reviewed the patient's chart and labs.  Questions were answered to the patient's satisfaction.     Jordanne Elsbury A Harshith Pursell

## 2022-12-08 NOTE — Plan of Care (Signed)

## 2022-12-08 NOTE — Progress Notes (Addendum)
HD#1 SUBJECTIVE:  Patient Summary: Amanda [name] is a 82 y.o. with a pertinent PMH of A-fib status post cardioversion, CVA, CAD status post STEMI, not on any anticoagulation, who presented with shortness of breath and fatigue and admitted for atrial fibrillation with RVR.   Overnight Events: Patient was electrically cardioverted to sinus rhythm by cardiology.  Interim History: Patient was seen at bedside after electrical cardioversion.  She was laying in bed eating her breakfast with daughter at bedside.  Patient says she feels much better after the procedure and did not have any complaints at this time.  Daughter's questions and concerns adequately addressed.   OBJECTIVE:  Vital Signs: Vitals:   12/08/22 0820 12/08/22 0830 12/08/22 1136 12/08/22 1159  BP: (!) 124/58 (!) 138/55 (!) 145/57 (!) 145/57  Pulse: (!) 57 (!) 57 63   Resp: 19 (!) 21 16   Temp:   97.7 F (36.5 C)   TempSrc:   Oral   SpO2: 92% 97% 100%   Weight:      Height:       Supplemental O2: Room Air SpO2: 100 % O2 Flow Rate (L/min): 2 L/min  Filed Weights   12/07/22 2228 12/08/22 0450 12/08/22 0452  Weight: 83.9 kg 81.6 kg 81.6 kg     Intake/Output Summary (Last 24 hours) at 12/08/2022 1339 Last data filed at 12/08/2022 1238 Gross per 24 hour  Intake 470 ml  Output 305 ml  Net 165 ml   Net IO Since Admission: 165 mL [12/08/22 1339]  Physical Exam: Physical Exam  Patient Lines/Drains/Airways Status     Active Line/Drains/Airways     Name Placement date Placement time Site Days   Peripheral IV 03/25/21 20 G Right Hand 03/25/21  1409  Hand  622   Peripheral IV 12/06/22 20 G 1" Left;Posterior Hand 12/06/22  1803  Hand  1   Wound / Incision (Open or Dehisced) 03/24/21 Skin tear Arm Lower;Posterior;Right red, bleeding 03/24/21  1930  Arm  623             ASSESSMENT/PLAN:  Assessment: Principal Problem:   Atrial fibrillation with RVR (HCC)   Plan: # Atrial fibrillation with RVR not on  AC #History of CVA 718 #History of cardioversion x4 Patient had electrical cardioversion today and converted to sinus rhythm. Patient says she is feeling much better after the procedure. Cardiology continue to follow. 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 Patient have improved significantly from Lasix.  - Will continue to diuresis with IV Laxis today - Continue to monitor in/out  - Continue to trend daily weights - Continue Hydralazine 50 mg q6h and Imdur 60 mg daily  - Morning BMP   # Chronic conditions  . Type 2 diabetes - Continue on SSI, Daily CGM .  Hypertension -Continue on home amlodipine 5 mg .  Hyperlipidemia -Continue pravastatin 40 mg and Evocolcumab 140 mg injection  .  GERD - Continue Protonix 40 mg daily and famotidine 40 mg daily .  Gout -  Discontinue Febuxostat 40  .  CKD stage IV -Trend creatinine, avoid nephrotoxic agents .  Constipation - Continue Colace 100 mg as needed .  Osteoporosis - Patient takes denosumab 60 mg injection every 6 months, no changes needed at this time .  Anemia of chronic disease versus iron deficiency anemia - Trend CBC,continue iron sulphate    Best Practice: Diet: Cardiac diet VTE: apixaban (ELIQUIS) tablet 2.5 mg Start: 12/06/22 2200 Code: Full DISPO: Anticipated discharge in 3 days to Home pending  Medical work up and DCCV .  Signature: Kathleen Lime , MD Internal Medicine Resident, PGY-1 [facility]  Pager: [phone] 1:39 PM,  12/08/2022   Please contact the on call pager after 5 pm and on weekends at [phone].

## 2022-12-08 NOTE — Interval H&P Note (Signed)
History and Physical Interval Note:  12/08/2022 7:37 AM  Amanda [name]  has presented today for surgery, with the diagnosis of atrial fibrillation.  The various methods of treatment have been discussed with the patient and family. After consideration of risks, benefits and other options for treatment, the patient has consented to  Procedure(s): CARDIOVERSION (CATH LAB) (N/A) as a surgical intervention.  The patient's history has been reviewed, patient examined, no change in status, stable for surgery.  I have reviewed the patient's chart and labs.  Questions were answered to the patient's satisfaction.     Milessa Hogan A Lonnetta Kniskern

## 2022-12-08 NOTE — Transfer of Care (Signed)
Immediate Anesthesia Transfer of Care Note  Patient: Amanda [name]  Procedure(s) Performed: CARDIOVERSION (CATH LAB)  Patient Location: PACU  Anesthesia Type:General  Level of Consciousness: drowsy  Airway & Oxygen Therapy: Patient connected to face mask oxygen  Post-op Assessment: Report given to RN and Post -op Vital signs reviewed and stable  Post vital signs: Reviewed and stable  Last Vitals:  Vitals Value Taken Time  BP 125/55 12/08/22 0806  Temp    Pulse 59 12/08/22 0807  Resp 19 12/08/22 0807  SpO2 99 % 12/08/22 0807  Vitals shown include unfiled device data.  Last Pain:  Vitals:   12/08/22 0701  TempSrc:   PainSc: 0-No pain         Complications: No notable events documented.

## 2022-12-08 NOTE — Progress Notes (Signed)
Dr. Elberta Fortis in to talk w/patient

## 2022-12-08 NOTE — Progress Notes (Addendum)
Patient Name: Amanda [name] Date of Encounter: 12/08/2022  Primary Cardiologist: Gypsy Balsam, MD Electrophysiologist: Regan Lemming, MD  Interval Summary   Pt reports she urinated several times overnight. No I/O's documented.   At this time, the patient denies chest pain, shortness of breath, or any new concerns.  Vital Signs    Vitals:   12/08/22 0450 12/08/22 0452 12/08/22 0454 12/08/22 0513  BP:   (!) 161/93 (!) 161/93  Pulse:  (!) 107    Resp:   20   Temp:   98.6 F (37 C)   TempSrc:   Oral   SpO2:  97%    Weight: 81.6 kg 81.6 kg    Height:        Intake/Output Summary (Last 24 hours) at 12/08/2022 0630 Last data filed at 12/08/2022 0456 Gross per 24 hour  Intake 240 ml  Output 5 ml  Net 235 ml   Filed Weights   12/07/22 2228 12/08/22 0450 12/08/22 0452  Weight: 83.9 kg 81.6 kg 81.6 kg    Physical Exam    GEN- The patient is well appearing, alert and oriented x 3 today.   Lungs- Clear to ausculation bilaterally, normal work of breathing Cardiac- Irregularly irregular rate and rhythm, no murmurs, rubs or gallops GI- soft, NT, ND, + BS Extremities- no clubbing or cyanosis. No edema  Telemetry    AF 90-110's, rare PVC (personally reviewed)  DEVICE HISTORY:  MDT PPM 04/2019 implanted for intermittent heart block  Hospital Course    Amanda [name] is a 82 y.o. 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  Found to have AFwRVR.   She had a DCCV in 09/2022 but had ERAF after 4 days. She was seen by Dr. Bing Matter in 10/2022 and amiodarone was decreased to 200mg  daily. Last seen by EP 11/16/22 and was in AF at  that time. She was on amiodarone 200 mg daily at EP visit and planned for ablation 01/2023   Assessment & Plan    Atrial Fibrillation  CHA2DS2-VASc 9 / 12.2% annual CVA risk -continue amiodarone 400 mg BID x14 days, then taper to 200 mg BID x14 d, then 200mg  daily  -not a candidate for Tikosyn given Cr Cl -pending DCCV -AF ablation planned for 01/18/23  -lasix per primary  -follow I/O's    MDT PPM In Situ  Implanted 04/2019 for intermittent heart block.     Secondary Hypercoagulable State  -continue eliquis  -hx of GIB, monitor for bleeding    Moderate-Severe MR Severe TR  -volume status as above  -outpt follow up    HTN  -per primary   CKD III/IV -per primary    OSA  -CPAP at bedtime         For questions or updates, please contact [facility] Please consult [email or web address] for contact info under Cardiology/STEMI.  Signed, Canary Brim, MSN, APRN, NP-C, AGACNP-BC [facility] - Electrophysiology  12/08/2022, 10:03 AM  I have seen and examined this patient with Canary Brim.  Agree with above, note added to reflect my findings.  Patient is planned for cardioversion today.  I's and O's were not collected overnight,  but patient reports good response to Lasix.  GEN: Well nourished, well developed, in no acute distress  HEENT: normal  Neck: no JVD, carotid bruits, or masses Cardiac: irregular; no murmurs, rubs, or gallops,no edema  Respiratory: Crackles throughout  GI: soft, nontender, nondistended, + BS MS: no deformity or atrophy  Skin: warm and dry, device site well healed Neuro:  Strength and sensation are intact Psych: euthymic mood, full affect   Persistent atrial fibrillation: Currently on amiodarone.  Dose increased this admission.  Cardioversion today.  Has plans for upcoming ablation. Acute on chronic diastolic heart failure: Patient remains volume overloaded.  Continue diuresis.  Amanda [name] need accurate I&O. Moderate to severe mitral and tricuspid  regurgitation: Likely Amanda [name] improve with maintenance of sinus rhythm and diuresis CKD stage III-IV: Plan per primary team  Amanda [name] M. Umi Mainor MD 12/08/2022 10:43 AM

## 2022-12-08 NOTE — CV Procedure (Signed)
    Electrical Cardioversion Procedure Note Amanda [name] [id number] 1940-03-15  Procedure: Electrical Cardioversion Indications:  Atrial Fibrillation  Time Out: Verified patient identification, verified procedure,medications/allergies/relevent history reviewed, required imaging and test results available.  Performed  Procedure Details  The patient was NPO after midnight. Anesthesia was administered at the beside  by [facility] with 60 mg of lidocaine and 60 mg propofol.  Cardioversion was done with synchronized biphasic defibrillation with AP pads with 200 Joules.  The patient converted to an atrially paced rhythm.  At time of post procedural evaluation, in sinus rhythm. The patient tolerated the procedure well.  IMPRESSION:  Successful cardioversion of atrial fibrillation.   Riley Lam, MD FASE [facility] Cardiologist [facility]  [address] [place], [address] [place], [place] [address] [phone]  8:03 AM

## 2022-12-08 NOTE — Progress Notes (Signed)
Report given to cath lab for pts cardioversion this AM. Pt transported to procedure area via wheelchair.

## 2022-12-08 NOTE — Anesthesia Postprocedure Evaluation (Signed)
Anesthesia Post Note  Patient: Amanda [name]  Procedure(s) Performed: CARDIOVERSION (CATH LAB)     Patient location during evaluation: PACU Anesthesia Type: General Level of consciousness: awake and alert Pain management: pain level controlled Vital Signs Assessment: post-procedure vital signs reviewed and stable Respiratory status: spontaneous breathing, nonlabored ventilation, respiratory function stable and patient connected to nasal cannula oxygen Cardiovascular status: blood pressure returned to baseline and stable Postop Assessment: no apparent nausea or vomiting Anesthetic complications: no   No notable events documented.  Last Vitals:  Vitals:   12/08/22 0820 12/08/22 0830  BP: (!) 124/58 (!) 138/55  Pulse: (!) 57 (!) 57  Resp: 19 (!) 21  Temp:    SpO2: 92% 97%    Last Pain:  Vitals:   12/08/22 0812  TempSrc: Temporal  PainSc: 0-No pain                 Nelle Don Cordarius Benning

## 2022-12-09 ENCOUNTER — Encounter (HOSPITAL_COMMUNITY): Payer: Self-pay | Admitting: Internal Medicine

## 2022-12-09 DIAGNOSIS — I4891 Unspecified atrial fibrillation: Secondary | ICD-10-CM | POA: Diagnosis not present

## 2022-12-09 LAB — URINALYSIS, W/ REFLEX TO CULTURE (INFECTION SUSPECTED)
Bilirubin Urine: NEGATIVE
Glucose, UA: NEGATIVE mg/dL
Hgb urine dipstick: NEGATIVE
Ketones, ur: NEGATIVE mg/dL
Nitrite: NEGATIVE
Protein, ur: NEGATIVE mg/dL
Specific Gravity, Urine: 1.009 (ref 1.005–1.030)
pH: 5 (ref 5.0–8.0)

## 2022-12-09 LAB — RENAL FUNCTION PANEL
Albumin: 2.2 g/dL — ABNORMAL LOW (ref 3.5–5.0)
Anion gap: 9 (ref 5–15)
BUN: 53 mg/dL — ABNORMAL HIGH (ref 8–23)
CO2: 21 mmol/L — ABNORMAL LOW (ref 22–32)
Calcium: 10.9 mg/dL — ABNORMAL HIGH (ref 8.9–10.3)
Chloride: 105 mmol/L (ref 98–111)
Creatinine, Ser: 2.4 mg/dL — ABNORMAL HIGH (ref 0.44–1.00)
GFR, Estimated: 20 mL/min — ABNORMAL LOW (ref >60)
Glucose, Bld: 151 mg/dL — ABNORMAL HIGH (ref 70–99)
Phosphorus: 4 mg/dL (ref 2.5–4.6)
Potassium: 4.6 mmol/L (ref 3.5–5.1)
Sodium: 135 mmol/L (ref 135–145)

## 2022-12-09 LAB — GLUCOSE, CAPILLARY
Glucose-Capillary: 137 mg/dL — ABNORMAL HIGH (ref 70–99)
Glucose-Capillary: 173 mg/dL — ABNORMAL HIGH (ref 70–99)
Glucose-Capillary: 203 mg/dL — ABNORMAL HIGH (ref 70–99)

## 2022-12-09 LAB — PROTEIN ELECTROPHORESIS, SERUM
A/G Ratio: 0.8 (ref 0.7–1.7)
Albumin ELP: 2.3 g/dL — ABNORMAL LOW (ref 2.9–4.4)
Alpha-1-Globulin: 0.4 g/dL (ref 0.0–0.4)
Alpha-2-Globulin: 1.2 g/dL — ABNORMAL HIGH (ref 0.4–1.0)
Beta Globulin: 0.8 g/dL (ref 0.7–1.3)
Gamma Globulin: 0.6 g/dL (ref 0.4–1.8)
Globulin, Total: 3 g/dL (ref 2.2–3.9)
Total Protein ELP: 5.3 g/dL — ABNORMAL LOW (ref 6.0–8.5)

## 2022-12-09 LAB — CBC
HCT: 25.6 % — ABNORMAL LOW (ref 36.0–46.0)
Hemoglobin: 7.8 g/dL — ABNORMAL LOW (ref 12.0–15.0)
MCH: 28.1 pg (ref 26.0–34.0)
MCHC: 30.5 g/dL (ref 30.0–36.0)
MCV: 92.1 fL (ref 80.0–100.0)
Platelets: 359 10*3/uL (ref 150–400)
RBC: 2.78 MIL/uL — ABNORMAL LOW (ref 3.87–5.11)
RDW: 16.4 % — ABNORMAL HIGH (ref 11.5–15.5)
WBC: 7.2 10*3/uL (ref 4.0–10.5)
nRBC: 0.3 % — ABNORMAL HIGH (ref 0.0–0.2)

## 2022-12-09 LAB — MAGNESIUM: Magnesium: 2.2 mg/dL (ref 1.7–2.4)

## 2022-12-09 MED ORDER — SODIUM CHLORIDE 0.9 % IV SOLN: 1.0000 g | INTRAVENOUS | Status: DC

## 2022-12-09 MED ORDER — FUROSEMIDE 10 MG/ML IJ SOLN
40.0000 mg | Freq: Once | INTRAMUSCULAR | Status: AC
  Administered 2022-12-09: 40 mg via INTRAVENOUS
  Filled 2022-12-09: qty 4

## 2022-12-09 MED ORDER — CAMPHOR-MENTHOL 0.5-0.5 % EX LOTN
TOPICAL_LOTION | CUTANEOUS | Status: DC | PRN
  Administered 2022-12-11: 1 via TOPICAL
  Filled 2022-12-09: qty 222

## 2022-12-09 MED ORDER — TORSEMIDE 20 MG PO TABS: 20.0000 mg | ORAL_TABLET | Freq: Every day | ORAL | Status: DC

## 2022-12-09 MED ORDER — SODIUM CHLORIDE 0.9 % IV SOLN
1.0000 g | INTRAVENOUS | Status: AC
  Administered 2022-12-09 – 2022-12-11 (×3): 1 g via INTRAVENOUS
  Filled 2022-12-09 (×3): qty 10

## 2022-12-09 MED FILL — Apixaban Tab 5 MG: ORAL | Qty: 1 | Status: AC

## 2022-12-09 NOTE — TOC Initial Note (Signed)
Transition of Care [facility]) - Initial/Assessment Note    Patient Details  Name: Amanda [name] MRN: [id number] Date of Birth: 1940-10-24  [facility] CM/SW Contact:    Lawerance Sabal, RN Phone Number: 12/09/2022, 3:00 PM  Clinical Narrative:                  Spoke w patient over the phone. She is agreeable to [facility] services, she would like to use who she has had in the past, [facility], they were able to accept.  Patient states she will need rollator and this has been ordered through [employer] who will deliver it to her room today Expected Discharge Plan: Home w Home Health Services Barriers to Discharge: Continued Medical Work up   Patient Goals and CMS Choice Patient states their goals for this hospitalization and ongoing recovery are:: to go home CMS Medicare.gov Compare Post Acute Care list provided to:: Patient Choice offered to / list presented to : Patient      Expected Discharge Plan and Services   Discharge Planning Services: CM Consult Post Acute Care Choice: Home Health, Durable Medical Equipment Living arrangements for the past 2 months: Single Family Home                 DME Arranged: Walker rolling with seat DME Agency: [employer] Date DME Agency Contacted: 12/09/22 Time DME Agency Contacted: 1459 Representative spoke with at DME Agency: Vaughan Basta HH Arranged: PT HH Agency: [facility] Date [facility] Agency Contacted: 12/09/22 Time HH Agency Contacted: 1500 Representative spoke with at [facility] Agency: [name]  Prior Living Arrangements/Services Living arrangements for the past 2 months: Single Family Home Lives with:: Significant Other   Do you feel safe going back to the place where you live?: Yes               Activities of Daily Living   ADL Screening (condition at time of admission) Independently performs ADLs?: Yes (appropriate for developmental age) Is the patient deaf or have difficulty hearing?: Yes Does the patient have difficulty  seeing, even when wearing glasses/contacts?: No Does the patient have difficulty concentrating, remembering, or making decisions?: No  Permission Sought/Granted                  Emotional Assessment              Admission diagnosis:  High output heart failure (HCC) [I50.83] Acute pulmonary edema (HCC) [J81.0] Atrial fibrillation with RVR (HCC) [I48.91] Patient Active Problem List   Diagnosis Date Noted   Atrial fibrillation with RVR (HCC) 12/06/2022   Paroxysmal atrial flutter (HCC) 07/22/2022   Secondary hypercoagulable state (HCC) 04/23/2021   Constipation 03/28/2021   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   Closed left hip fracture (HCC) 03/23/2021   Mitral regurgitation severe based on TEE from 2022 April 06/10/2020   Thyroid disease    Chronic systolic CHF (congestive heart failure) (HCC)    OSA on CPAP    Gastroesophageal reflux    Diverticulosis    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   History of cardiac monitoring 02/23/2018   History of iron deficiency anemia 12/28/2017   Kidney disease 07/21/2017   Hypertensive disorder 07/21/2017   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/24/2017   Coronary artery disease 10/12/2016   AKI (acute kidney injury) (HCC) 09/01/2016   Abnormal nuclear stress test 06/23/2016   Anemia due to stage 4  chronic kidney disease (HCC) 04/04/2015   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) 04/04/2015   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) 04/04/2015   Vitamin D deficiency 04/04/2015   Hypercalcemia 04/04/2015   Atrial fibrillation (HCC) 09/17/2014   Dyslipidemia 09/17/2014   PCP:  Street, Stephanie Coup, MD Pharmacy:   [facility] - [place], [place] - [address] [place] [address] Phone: [phone] Fax: [phone]     Social Determinants of Health  (SDOH) Social History: SDOH Screenings   Food Insecurity: No Food Insecurity (12/07/2022)  Housing: Low Risk  (12/07/2022)  Transportation Needs: No Transportation Needs (12/07/2022)  Utilities: Not At Risk (12/07/2022)  Tobacco Use: Medium Risk (12/08/2022)   SDOH Interventions:     Readmission Risk Interventions     No data to display

## 2022-12-09 NOTE — Progress Notes (Addendum)
Mobility Specialist Progress Note;   12/09/22 1530  Mobility  Activity Ambulated with assistance in hallway  Level of Assistance Standby assist, set-up cues, supervision of patient - no hands on  Assistive Device Four wheel walker  Distance Ambulated (ft) 175 ft  Activity Response Tolerated well  Mobility Referral Yes  $Mobility charge 1 Mobility  Mobility Specialist Start Time (ACUTE ONLY) 1530  Mobility Specialist Stop Time (ACUTE ONLY) 1545  Mobility Specialist Time Calculation (min) (ACUTE ONLY) 15 min   Pt agreeable to mobility. Required no physical assistance during ambulation, SV. Took 1x sitting rest break d/t fatigue. C/o shoulders getting tired during ambulation d/t rollator. HR stable in 70s throughout ambulation. Pt back sitting on EOB with all needs met. Daughter in room.   Caesar Bookman Mobility Specialist Please contact via SecureChat or Rehab Office [phone]

## 2022-12-09 NOTE — Plan of Care (Signed)

## 2022-12-09 NOTE — Progress Notes (Addendum)
HD#2 SUBJECTIVE:  Patient Summary: Amanda [name] is a 82 y.o. with a pertinent PMH of A-fib status post cardioversion, CVA, CAD status post STEMI, not on any anticoagulation, who presented with shortness of breath and fatigue and admitted for atrial fibrillation with RVR s/p DCCV 12/08/2022.  Overnight Events:  No events overnight    Interim History:  Patient seen at bedside, in no acute distress. Patient reports "feeling well " and didn't have any complaints. Patient was weaned off 2 L  to room air with good saturations this morning.   OBJECTIVE:  Vital Signs: Vitals:   12/09/22 0300 12/09/22 0400 12/09/22 0517 12/09/22 0729  BP: (!) 143/46  (!) 144/62   Pulse: 61     Resp: 20     Temp: 97.6 F (36.4 C)   97.7 F (36.5 C)  TempSrc: Oral     SpO2:  97%    Weight: 82.2 kg     Height:       Supplemental O2: Room Air SpO2: 97 % O2 Flow Rate (L/min): 2 L/min  Filed Weights   12/08/22 0450 12/08/22 0452 12/09/22 0300  Weight: 81.6 kg 81.6 kg 82.2 kg     Intake/Output Summary (Last 24 hours) at 12/09/2022 1129 Last data filed at 12/09/2022 0440 Gross per 24 hour  Intake 230 ml  Output 1300 ml  Net -1070 ml   Net IO Since Admission: -835 mL [12/09/22 1129]  Physical Exam:   Physical Exam Constitutional:      General: She is not in acute distress.    Appearance: She is not ill-appearing, toxic-appearing or diaphoretic.     Comments: Laying comfortably in bed   Pulmonary:     Effort: Pulmonary effort is normal.     Comments: Weaned from 2L to RA with good saturations Abdominal:     Palpations: Abdomen is soft. There is no mass.     Tenderness: There is no abdominal tenderness. There is no guarding or rebound.     Comments: Normoactive Bowel Sounds   Musculoskeletal:     Comments: Lower extremity still mildly tender with +1 pitting edema bilaterally  Neurological:     Mental Status: She is alert.  Psychiatric:        Mood and Affect: Mood normal.         Behavior: Behavior normal.        Thought Content: Thought content normal.     Patient Lines/Drains/Airways Status     Active Line/Drains/Airways     Name Placement date Placement time Site Days   Peripheral IV 03/25/21 20 G Right Hand 03/25/21  1409  Hand  622   Peripheral IV 12/06/22 20 G 1" Left;Posterior Hand 12/06/22  1803  Hand  1   Wound / Incision (Open or Dehisced) 03/24/21 Skin tear Arm Lower;Posterior;Right red, bleeding 03/24/21  1930  Arm  623             ASSESSMENT/PLAN:  Assessment: Principal Problem:   Atrial fibrillation with RVR (HCC)  Plan: # Atrial fibrillation with RVR not on AC  #History of CVA 718 #History of cardioversion x4 S/p DCCV. Patient continue to be in sinus rhythm. Denies any chest pain or shortness of breath or any palpitations at this time. - Taper Amiodarone to 200 mg BID for 14 days  - Continue Telemetry  - Continue Eliquis 2.5 mg BID for stroke prevention  - Monitor for bleeding due to hx of GI bleed  - AF ablation planned  for December 16th   # Acute on chronic heart failure with preserved ejection fraction exacerbation  #CAD status post STEMI with RCA DES placement #Mitral regurgitation #Severe tricuspid regurgitation Had 2 doses  of Laxis yesterday  with good urine output,net negative 1070 . Patient still has crackles on exam and seems to be still volume overloaded. Will give another dose of IV Lasix 40 mg today to diurese further and reassess tomorrow. Will monitor Kidney function closely   - Encourage use of IS  - IV Lasix 40 mg  - Monitor in/out  - Daily weights  - Continue Hydralazine 50 mg qh6 and Imdur 60 mg daily  - Repeat BMP  # Chronic conditions  . Type 2 diabetes - Continue on SSI, Daily CGM .  Hypertension -Continue on home amlodipine 5 mg .  Hyperlipidemia -Continue pravastatin 40 mg and Evocolcumab 140 mg injection  .  GERD - Continue Protonix 40 mg daily and famotidine 40 mg daily .  Gout -  Discontinue  Febuxostat 40  .  CKD stage IV -Trend creatinine, avoid nephrotoxic agents .  Constipation - Continue Colace 100 mg as needed .  Osteoporosis - Patient takes denosumab 60 mg injection every 6 months, no changes needed at this time .  Anemia of chronic disease versus iron deficiency anemia - Trend CBC,continue iron sulphate 325 mg daily with breakfast   Best Practice: Diet: Cardiac diet VTE: apixaban (ELIQUIS) tablet 2.5 mg Start: 12/06/22 2200 Code: Full DISPO: Anticipated discharge in 1 days to Home pending  Medical work up .  Signature: Kathleen Lime , MD Internal Medicine Resident, PGY-1 [facility]  Pager: [phone] 11:29 AM, 12/09/2022   Please contact the on call pager after 5 pm and on weekends at [phone].

## 2022-12-09 NOTE — Progress Notes (Addendum)
Patient Name: Amanda [name] Date of Encounter: 12/09/2022  Primary Cardiologist: Gypsy Balsam, MD Electrophysiologist: Hershal Eriksson Jorja Loa, MD  Interval Summary   The patient is doing well today.  Can tell she is back in SR. At this time, the patient denies chest pain, shortness of breath, or any new concerns.  Vital Signs    Vitals:   12/09/22 0100 12/09/22 0300 12/09/22 0400 12/09/22 0517  BP: (!) 143/46 (!) 143/46  (!) 144/62  Pulse: 73 61    Resp: 20 20    Temp: 97.6 F (36.4 C) 97.6 F (36.4 C)    TempSrc: Oral Oral    SpO2: 98%  97%   Weight:  82.2 kg    Height:        Intake/Output Summary (Last 24 hours) at 12/09/2022 0730 Last data filed at 12/09/2022 0440 Gross per 24 hour  Intake 230 ml  Output 1300 ml  Net -1070 ml   Filed Weights   12/08/22 0450 12/08/22 0452 12/09/22 0300  Weight: 81.6 kg 81.6 kg 82.2 kg    Physical Exam    GEN- elderly female in NAD, eating breakfast, alert and oriented x 3 today.   Lungs- Clear to ausculation bilaterally, normal work of breathing Cardiac- Regular rate and rhythm, no murmurs, rubs or gallops GI- soft, NT, ND, + BS Extremities- no clubbing or cyanosis. No edema  Telemetry    SR with 1AVB in 60's, no AF overnight (personally reviewed)  Hospital Course    Amanda [name] is a 82 y.o. 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  Found to have AFwRVR.    She had a DCCV in 09/2022 but had ERAF after 4 days. She was seen by Dr. Bing Matter in 10/2022 and amiodarone was decreased to 200mg  daily. Last seen by EP 11/16/22 and was in AF at that time. 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     Secondary Hypercoagulable State  -Eliquis 2.5mg  BID for stroke prevention / appropriate dose with Cr / age -monitor for bleeding, hx GIB    Moderate-Severe MR Severe TR  -volume as above  -outpatient follow up    HTN  -per primary   CKD III/IV -per primary    OSA  -CPAP at bedtime       For questions or updates, please contact [facility] Please consult [email or web address] for contact info under Cardiology/STEMI.  Signed, Canary Brim, MSN, APRN, NP-C, AGACNP-BC [facility] - Electrophysiology  12/09/2022, 7:30 AM  I have seen and examined this patient with Canary Brim.  Agree with above, note added to reflect my findings.  Remains in sinus rhythm, continued SOB but improved.  GEN: Well nourished, well developed, in no acute distress  HEENT: normal  Neck: no JVD, carotid bruits, or masses Cardiac:  RRR; no murmurs, rubs, or gallops,no edema  Respiratory:  crackles GI: soft, nontender, nondistended, + BS MS: no deformity or atrophy  Skin: warm and dry, device site well healed Neuro:  Strength and sensation are intact Psych: euthymic mood, full affect   Persistent atrial fibrillation: continue current amiodarone. Remains in sinus. Continue eliquis.  Acute on chronic diastolic heart failure: creatinine stable. Continue diuresis. Likely due to AF. CKD III/IV: plan per primary team. Needs daily BMP for monitoring during diuresis MR/TR: reassess post ablation. Was moderate when in sinus previously  Filippo Puls M. Francena Zender MD 12/09/2022 11:54 AM

## 2022-12-09 NOTE — Plan of Care (Signed)
  Problem: Education: Goal: Ability to describe self-care measures that may prevent or decrease complications (Diabetes Survival Skills Education) will improve Outcome: Progressing Goal: Individualized Educational Video(s) Outcome: Progressing   Problem: Coping: Goal: Ability to adjust to condition or change in health will improve Outcome: Progressing   Problem: Fluid Volume: Goal: Ability to maintain a balanced intake and output will improve Outcome: Progressing   Problem: Health Behavior/Discharge Planning: Goal: Ability to identify and utilize available resources and services will improve Outcome: Progressing Goal: Ability to manage health-related needs will improve Outcome: Progressing   Problem: Health Behavior/Discharge Planning: Goal: Ability to manage health-related needs will improve Outcome: Progressing   Problem: Metabolic: Goal: Ability to maintain appropriate glucose levels will improve Outcome: Progressing   Problem: Nutritional: Goal: Maintenance of adequate nutrition will improve Outcome: Progressing Goal: Progress toward achieving an optimal weight will improve Outcome: Progressing   Problem: Skin Integrity: Goal: Risk for impaired skin integrity will decrease Outcome: Progressing   Problem: Education: Goal: Knowledge of General Education information will improve Description: Including pain rating scale, medication(s)/side effects and non-pharmacologic comfort measures Outcome: Progressing   Problem: Clinical Measurements: Goal: Ability to maintain clinical measurements within normal limits will improve Outcome: Progressing Goal: Will remain free from infection Outcome: Progressing Goal: Diagnostic test results will improve Outcome: Progressing Goal: Respiratory complications will improve Outcome: Progressing Goal: Cardiovascular complication will be avoided Outcome: Progressing   Problem: Nutrition: Goal: Adequate nutrition will be  maintained Outcome: Progressing   Problem: Coping: Goal: Level of anxiety will decrease Outcome: Progressing   Problem: Elimination: Goal: Will not experience complications related to bowel motility Outcome: Progressing Goal: Will not experience complications related to urinary retention Outcome: Progressing   Problem: Pain Management: Goal: General experience of comfort will improve Outcome: Progressing   Problem: Safety: Goal: Ability to remain free from injury will improve Outcome: Progressing   Problem: Skin Integrity: Goal: Risk for impaired skin integrity will decrease Outcome: Progressing

## 2022-12-09 NOTE — Progress Notes (Signed)
Physical Therapy Treatment Patient Details Name: Amanda [name] MRN: [id number] DOB: August 28, 1940 Today's Date: 12/09/2022   History of Present Illness 82 y.o. female presents to [facility] hospital on 12/06/2022 with SOB in the setting of afib. PMH includes afib, GIB, CVA, STEMI, severe TR, biventricular PPM, DMII, HTN, HL, GERD, ischemic colitis.    PT Comments  Pt admitted with above diagnosis. Pt was able to ambulate with rollator  with 2 seated rest breaks during  gait.  Pt prefers rollator for stability and for energy conservation therefore recommend she use one at home. Pt agrees.  Pt should progress well and will continue PT.  Pt currently with functional limitations due to the deficits listed below (see PT Problem List). Pt will benefit from acute skilled PT to increase their independence and safety with mobility to allow discharge.       If plan is discharge home, recommend the following: Assistance with cooking/housework;Help with stairs or ramp for entrance   Can travel by private vehicle        Equipment Recommendations  Rollator (4 wheels)    Recommendations for Other Services       Precautions / Restrictions Precautions Precautions: Fall Precaution Comments: monitor sats Restrictions Weight Bearing Restrictions: No     Mobility  Bed Mobility Overal bed mobility: Independent                  Transfers Overall transfer level: Needs assistance Equipment used: Rollator (4 wheels) Transfers: Sit to/from Stand Sit to Stand: Modified independent (Device/Increase time)           General transfer comment: Educated pt how to use rollator to rest by locking and unlocking brakes. Pt took 2 sitting rest breaks.    Ambulation/Gait Ambulation/Gait assistance: Supervision Gait Distance (Feet): 125 Feet Assistive device: Rollator (4 wheels) Gait Pattern/deviations: Step-through pattern, Antalgic Gait velocity: reduced     General Gait Details: slowed step-through  gait as one leg is shorter than the other and pt states this is premorbid. Pt was able to use rollator and feels more confident with this.   Stairs             Wheelchair Mobility     Tilt Bed    Modified Rankin (Stroke Patients Only)       Balance Overall balance assessment: Needs assistance Sitting-balance support: No upper extremity supported, Feet supported Sitting balance-Leahy Scale: Good     Standing balance support: Single extremity supported, Reliant on assistive device for balance, Bilateral upper extremity supported, During functional activity Standing balance-Leahy Scale: Poor Standing balance comment: relies on at least 1 UE support                            Cognition Arousal: Alert Behavior During Therapy: WFL for tasks assessed/performed Overall Cognitive Status: Within Functional Limits for tasks assessed                                          Exercises General Exercises - Lower Extremity Ankle Circles/Pumps: AROM, Both, 10 reps, Supine Long Arc Quad: AROM, Both, 10 reps, Seated Hip Flexion/Marching: AROM, Both, 10 reps, Seated    General Comments General comments (skin integrity, edema, etc.): Sats >88% on RA, other VSS      Pertinent Vitals/Pain Pain Assessment Pain Assessment: No/denies pain    Home  Living                          Prior Function            PT Goals (current goals can now be found in the care plan section) Acute Rehab PT Goals Patient Stated Goal: to improve activity tolerance Progress towards PT goals: Progressing toward goals    Frequency    Min 1X/week      PT Plan      Co-evaluation              AM-PAC PT "6 Clicks" Mobility   Outcome Measure  Help needed turning from your back to your side while in a flat bed without using bedrails?: None Help needed moving from lying on your back to sitting on the side of a flat bed without using bedrails?:  None Help needed moving to and from a bed to a chair (including a wheelchair)?: A Little Help needed standing up from a chair using your arms (e.g., wheelchair or bedside chair)?: A Little Help needed to walk in hospital room?: A Little Help needed climbing 3-5 steps with a railing? : A Little 6 Click Score: 20    End of Session Equipment Utilized During Treatment: Gait belt Activity Tolerance: Patient limited by fatigue Patient left: in bed;with call bell/phone within reach (sitting EOB) Nurse Communication: Mobility status PT Visit Diagnosis: Other abnormalities of gait and mobility (R26.89)     Time: 1132-1150 PT Time Calculation (min) (ACUTE ONLY): 18 min  Charges:    $Gait Training: 8-22 mins PT General Charges $$ ACUTE PT VISIT: 1 Visit                     [name] M,PT Acute Rehab Services [phone]    [name] 12/09/2022, 1:42 PM

## 2022-12-10 DIAGNOSIS — I4891 Unspecified atrial fibrillation: Secondary | ICD-10-CM | POA: Diagnosis not present

## 2022-12-10 LAB — GLUCOSE, CAPILLARY
Glucose-Capillary: 152 mg/dL — ABNORMAL HIGH (ref 70–99)
Glucose-Capillary: 202 mg/dL — ABNORMAL HIGH (ref 70–99)
Glucose-Capillary: 172 mg/dL — ABNORMAL HIGH (ref 70–99)
Glucose-Capillary: 203 mg/dL — ABNORMAL HIGH (ref 70–99)

## 2022-12-10 LAB — RENAL FUNCTION PANEL
Albumin: 2.2 g/dL — ABNORMAL LOW (ref 3.5–5.0)
Anion gap: 12 (ref 5–15)
BUN: 58 mg/dL — ABNORMAL HIGH (ref 8–23)
CO2: 23 mmol/L (ref 22–32)
Calcium: 11.1 mg/dL — ABNORMAL HIGH (ref 8.9–10.3)
Chloride: 99 mmol/L (ref 98–111)
Creatinine, Ser: 2.38 mg/dL — ABNORMAL HIGH (ref 0.44–1.00)
GFR, Estimated: 20 mL/min — ABNORMAL LOW (ref >60)
Glucose, Bld: 186 mg/dL — ABNORMAL HIGH (ref 70–99)
Phosphorus: 3.8 mg/dL (ref 2.5–4.6)
Potassium: 4.4 mmol/L (ref 3.5–5.1)
Sodium: 134 mmol/L — ABNORMAL LOW (ref 135–145)

## 2022-12-10 LAB — CBC
HCT: 25.2 % — ABNORMAL LOW (ref 36.0–46.0)
Hemoglobin: 7.6 g/dL — ABNORMAL LOW (ref 12.0–15.0)
MCH: 26.7 pg (ref 26.0–34.0)
MCHC: 30.2 g/dL (ref 30.0–36.0)
MCV: 88.4 fL (ref 80.0–100.0)
Platelets: 416 10*3/uL — ABNORMAL HIGH (ref 150–400)
RBC: 2.85 MIL/uL — ABNORMAL LOW (ref 3.87–5.11)
RDW: 16.5 % — ABNORMAL HIGH (ref 11.5–15.5)
WBC: 7.5 10*3/uL (ref 4.0–10.5)
nRBC: 0 % (ref 0.0–0.2)

## 2022-12-10 MED ORDER — HYDROXYZINE HCL 25 MG PO TABS
25.0000 mg | ORAL_TABLET | Freq: Once | ORAL | Status: AC
  Administered 2022-12-11: 25 mg via ORAL
  Filled 2022-12-10: qty 1

## 2022-12-10 MED ORDER — FUROSEMIDE 10 MG/ML IJ SOLN
40.0000 mg | Freq: Three times a day (TID) | INTRAMUSCULAR | Status: AC
  Administered 2022-12-10: 40 mg via INTRAVENOUS
  Filled 2022-12-10: qty 4

## 2022-12-10 MED ORDER — ACETAMINOPHEN 325 MG PO TABS
650.0000 mg | ORAL_TABLET | Freq: Four times a day (QID) | ORAL | Status: DC | PRN
  Administered 2022-12-11 – 2022-12-12 (×2): 650 mg via ORAL
  Filled 2022-12-10 (×2): qty 2

## 2022-12-10 MED ORDER — FUROSEMIDE 10 MG/ML IJ SOLN
40.0000 mg | Freq: Three times a day (TID) | INTRAMUSCULAR | Status: DC
  Administered 2022-12-10: 40 mg via INTRAVENOUS
  Filled 2022-12-10: qty 4

## 2022-12-10 NOTE — Plan of Care (Signed)

## 2022-12-10 NOTE — Progress Notes (Addendum)
HD#3 SUBJECTIVE:  Patient Summary: Amanda [name] is a 82 y.o. with a pertinent PMH of A-fib status post cardioversion, CVA, CAD status post STEMI, not on any anticoagulation, who presented with shortness of breath and fatigue and admitted for atrial fibrillation with RVR s/p DCCV 12/08/2022.Hospital course complicated by bacteruria and started on ceftriaxone. End date is 12/10/2022   Overnight Events: No overnight event   Interim History:  Patient was laying in bed with daughter at bedside this morning.She reports no chest pain, shortness of breath or any abdominal pain. Denies any pain with urination.   OBJECTIVE:  Vital Signs: Vitals:   12/10/22 0742 12/10/22 1015 12/10/22 1200 12/10/22 1217  BP:   (!) 170/64 (!) 170/64  Pulse: 65 65  (!) 59  Resp:  20  20  Temp: 98.8 F (37.1 C) 97.7 F (36.5 C)  99 F (37.2 C)  TempSrc: Oral Oral  Oral  SpO2: 92% 91%  95%  Weight:      Height:       Supplemental O2: Room Air  Filed Weights   12/08/22 0452 12/09/22 0300 12/10/22 0414  Weight: 81.6 kg 82.2 kg 83.4 kg     Intake/Output Summary (Last 24 hours) at 12/10/2022 1341 Last data filed at 12/10/2022 1100 Gross per 24 hour  Intake 220 ml  Output 2500 ml  Net -2280 ml   Net IO Since Admission: -3,115 mL [12/10/22 1341]  Physical Exam:   Physical Exam Constitutional:      General: She is not in acute distress.    Appearance: She is not ill-appearing, toxic-appearing or diaphoretic.     Comments: Laying in bed   Pulmonary:     Effort: Pulmonary effort is normal. No respiratory distress.     Breath sounds: No stridor. No wheezing.     Comments: On RA Abdominal:     Palpations: Abdomen is soft. There is no mass.     Tenderness: There is no abdominal tenderness. There is no guarding or rebound.     Comments: Normoactive Bowel Sounds   Musculoskeletal:     Right lower leg: Edema present.     Left lower leg: Edema present.     Comments: Lower extremities still edematous  +1  Neurological:     Mental Status: She is alert.  Psychiatric:        Mood and Affect: Mood normal.        Behavior: Behavior normal.        Thought Content: Thought content normal.     Patient Lines/Drains/Airways Status     Active Line/Drains/Airways     Name Placement date Placement time Site Days   Peripheral IV 03/25/21 20 G Right Hand 03/25/21  1409  Hand  622   Peripheral IV 12/06/22 20 G 1" Left;Posterior Hand 12/06/22  1803  Hand  1   Wound / Incision (Open or Dehisced) 03/24/21 Skin tear Arm Lower;Posterior;Right red, bleeding 03/24/21  1930  Arm  623             ASSESSMENT/PLAN:  Assessment: Principal Problem:   Atrial fibrillation with RVR (HCC)  Plan: # Atrial fibrillation with RVR not on [facility]  #History of CVA 718 #History of cardioversion x4 S/p DCCV 12/10/2022. Remains in SR  - Appreciate cardiology recs - Amiodarone 400 mg Daily  - Telemetry  - Continue Eliquis   # Acute on chronic heart failure with preserved ejection fraction exacerbation  #CAD status post STEMI with RCA DES placement #  Mitral regurgitation #Severe tricuspid regurgitation On exam today, pt is still volume overloaded but is otherwise asymptomatic. She is on room air with good saturations and denies any shortness of breath or chest pain .Cardiology continues to follow. - Continue  IV Lasix 40 mg  BID  - Monitor in/out  - Daily weights  - Continue Hydralazine 50 mg qh6 and Imdur 60 mg daily  - Monitor kidney function /Repeat BMP  - Encourage OOB   #Hypercalcemia  Patient is hypercalcemic, corrected Ca this morning is 12.5. She however denies any nausea or vomiting, constipation at this time.The gamma globulin regionon protein electrophoresis is  unremarkable and evidence of monoclonal protein is not apparent.  UPEP/UIFE/Light Chains/TP is pending. Will order a PTH and PTHrP  levels to further evaluate this hypercalcemia  #Bacteruria Reported mild pain on urination. UA showed small  leucocytes esterase and rare bacteria.Started on [employer] End date 12/11/22.Patient doesn't have any urinary symptoms at this time.     # Chronic conditions  . Type 2 diabetes - Continue on SSI, Daily CGM .  Hypertension -Continue on home amlodipine 5 mg .  Hyperlipidemia -Continue pravastatin 40 mg and Evocolcumab 140 mg injection  .  GERD - Continue Protonix 40 mg daily and famotidine 40 mg daily .  Gout -  Discontinue Febuxostat 40  .  CKD stage IV -Trend creatinine, avoid nephrotoxic agents .  Constipation - Continue Colace 100 mg as needed .  Osteoporosis - Patient takes denosumab 60 mg injection every 6 months, no changes needed at this time .  Anemia of chronic disease versus iron deficiency anemia - Trend CBC,continue iron sulphate 325 mg daily with breakfast   Best Practice: Diet: Cardiac diet VTE: apixaban (ELIQUIS) tablet 2.5 mg Start: 12/06/22 2200 Code: Full DISPO: Anticipated discharge in 1 days to Home pending  Medical work up .  Signature: Kathleen Lime , MD Internal Medicine Resident, PGY-1 [facility]  Pager: [phone] 1:41 PM, 12/10/2022   Please contact the on call pager after 5 pm and on weekends at [phone].

## 2022-12-10 NOTE — Progress Notes (Addendum)
Patient Name: Amanda [name] Date of Encounter: 12/10/2022  Primary Cardiologist: Gypsy Balsam, MD Electrophysiologist: Amanda Zinni Jorja Loa, MD  Interval Summary   The patient is doing well today.  At this time, the patient denies chest pain, shortness of breath, or any new concerns.  Vital Signs    Vitals:   12/10/22 0414 12/10/22 0535 12/10/22 0616 12/10/22 0742  BP: (!) 171/91 (!) 171/91 (!) 163/63   Pulse: 60  66 65  Resp: 18  18   Temp: 98 F (36.7 C)   98.8 F (37.1 C)  TempSrc: Oral   Oral  SpO2: 94%   92%  Weight: 83.4 kg     Height:        Intake/Output Summary (Last 24 hours) at 12/10/2022 1016 Last data filed at 12/10/2022 0538 Gross per 24 hour  Intake 220 ml  Output 1600 ml  Net -1380 ml   Filed Weights   12/08/22 0452 12/09/22 0300 12/10/22 0414  Weight: 81.6 kg 82.2 kg 83.4 kg    Physical Exam    GEN- elderly female lying in bed in NAD, patient is well appearing, alert and oriented x 3 today.   Lungs- normal work of breathing with ongoing bibasilar crackles  Cardiac- Regular rate and rhythm, no murmurs, rubs or gallops GI- soft, NT, ND, + BS Extremities- no clubbing or cyanosis. No edema  Telemetry    SB 50-60 with 1AVB (personally reviewed)  Hospital Course    Amanda [name] is a 82 y.o. 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  Found to have AFwRVR.    She had a DCCV in 09/2022 but had ERAF after 4 days. She was seen by Dr. Bing [name] in 10/2022 and amiodarone was decreased to 200mg  daily. Last seen by EP 11/16/22 and was in AF at that time. She was on amiodarone 200 mg daily at EP visit and  planned for ablation 01/2023   Assessment & Plan    Atrial Fibrillation  CHA2DS2-VASc 9 / 12.2% annual CVA risk. Not a tikosyn candidate with renal function.  -continue amiodarone 400mg  BID until ablation (no taper) -AF ablation planned for 12/16 with Dr. Elberta [name]  -increase lasix dosing, 40 mg IV x2 doses for 11/7 -follow strict I/O's   MDT PPM In Situ  Implanted 04/2019 for intermittent heart block.     Secondary Hypercoagulable State  -eliquis  -monitor for bleeding with hx of GIB    Moderate-Severe MR Severe TR  -outpt follow up    HTN  -per primary   CKD III/IV -per primary    OSA  -CPAP at bedtime       For questions or updates, please contact [name] Please consult [email or web address] for contact info under Cardiology/STEMI.  Signed, Amanda Brim, MSN, APRN, NP-C, AGACNP-BC Amanda [name] - Electrophysiology  12/10/2022, 10:16 AM  I have seen and examined this patient with Amanda [name].  Agree with above, note added to reflect my findings.  Remains in sinus rhythm.  No acute complaints.  GEN: Well nourished, well developed, in no acute distress  HEENT: normal  Neck: no JVD, carotid bruits, or masses Cardiac: RRR; no murmurs,  rubs, or gallops, 1+ edema  Respiratory:   crackles at the bases  GI: soft, nontender, nondistended, + BS MS: no deformity or atrophy  Skin: warm and dry Neuro:  Strength and sensation are intact Psych: euthymic mood, full affect   Persistent atrial fibrillation: Post cardioversion.  Patient on amiodarone.  Amanda [name] continue at current dose until ablation later this month.  Continue anticoagulation. Acute on chronic diastolic heart failure: Remains volume overloaded.  Continue diuresis.  Creatinine remains overall stable. MR/TR: Amanda [name] need repeat echo post ablation.  Has been moderate in the past.  Amanda Thien M. [name] MD 12/10/2022 10:23 AM

## 2022-12-10 NOTE — Care Management Important Message (Signed)
Important Message  Patient Details  Name: Amanda [name] MRN: [id number] Date of Birth: November 05, 1940   Important Message Given:  Yes - Medicare IM     Dorena Bodo 12/10/2022, 2:58 PM

## 2022-12-11 DIAGNOSIS — I4891 Unspecified atrial fibrillation: Secondary | ICD-10-CM | POA: Diagnosis not present

## 2022-12-11 LAB — RENAL FUNCTION PANEL
Albumin: 2.1 g/dL — ABNORMAL LOW (ref 3.5–5.0)
Anion gap: 8 (ref 5–15)
BUN: 58 mg/dL — ABNORMAL HIGH (ref 8–23)
CO2: 22 mmol/L (ref 22–32)
Calcium: 11 mg/dL — ABNORMAL HIGH (ref 8.9–10.3)
Chloride: 107 mmol/L (ref 98–111)
Creatinine, Ser: 2.41 mg/dL — ABNORMAL HIGH (ref 0.44–1.00)
GFR, Estimated: 20 mL/min — ABNORMAL LOW (ref >60)
Glucose, Bld: 199 mg/dL — ABNORMAL HIGH (ref 70–99)
Phosphorus: 3.7 mg/dL (ref 2.5–4.6)
Potassium: 4.4 mmol/L (ref 3.5–5.1)
Sodium: 137 mmol/L (ref 135–145)

## 2022-12-11 LAB — CULTURE, BLOOD (ROUTINE X 2)
Culture: NO GROWTH
Culture: NO GROWTH
Special Requests: ADEQUATE
Special Requests: ADEQUATE

## 2022-12-11 LAB — GLUCOSE, CAPILLARY
Glucose-Capillary: 158 mg/dL — ABNORMAL HIGH (ref 70–99)
Glucose-Capillary: 159 mg/dL — ABNORMAL HIGH (ref 70–99)
Glucose-Capillary: 169 mg/dL — ABNORMAL HIGH (ref 70–99)
Glucose-Capillary: 161 mg/dL — ABNORMAL HIGH (ref 70–99)

## 2022-12-11 LAB — CBC
HCT: 27.6 % — ABNORMAL LOW (ref 36.0–46.0)
Hemoglobin: 8.6 g/dL — ABNORMAL LOW (ref 12.0–15.0)
MCH: 27.6 pg (ref 26.0–34.0)
MCHC: 31.2 g/dL (ref 30.0–36.0)
MCV: 88.5 fL (ref 80.0–100.0)
Platelets: 422 10*3/uL — ABNORMAL HIGH (ref 150–400)
RBC: 3.12 MIL/uL — ABNORMAL LOW (ref 3.87–5.11)
RDW: 16.4 % — ABNORMAL HIGH (ref 11.5–15.5)
WBC: 8.1 10*3/uL (ref 4.0–10.5)
nRBC: 0.2 % (ref 0.0–0.2)

## 2022-12-11 LAB — PARATHYROID HORMONE, INTACT (NO CA): PTH: 84 pg/mL — ABNORMAL HIGH (ref 15–65)

## 2022-12-11 MED ORDER — FUROSEMIDE 10 MG/ML IJ SOLN
40.0000 mg | Freq: Once | INTRAMUSCULAR | Status: AC
  Administered 2022-12-11: 40 mg via INTRAVENOUS
  Filled 2022-12-11: qty 4

## 2022-12-11 MED ORDER — FUROSEMIDE 10 MG/ML IJ SOLN: 40.0000 mg | Freq: Three times a day (TID) | INTRAMUSCULAR | Status: DC

## 2022-12-11 NOTE — Progress Notes (Addendum)
HD#4 SUBJECTIVE:  Patient Summary: Amanda [name] is a 82 y.o. with a pertinent PMH of A-fib status post cardioversion, CVA, CAD status post STEMI, not on any anticoagulation, who presented with shortness of breath and fatigue and admitted for atrial fibrillation with RVR s/p DCCV 12/08/2022.Hospital course complicated by bacteruria and started on ceftriaxone. End date is 12/10/2022   Overnight Events:  Patient reverted to A-flutter from SR. She was however stable and didn't have chest pain pain or SOB  Interim History:  Saw patient at bedside today , she was sitting in bed eating her breakfast. Denied any chest pain or shortness of breath. She said she worked with the mobility specialist and she did great. She however reported that she felt her heart skip when she converted to A-flutter  OBJECTIVE:  Vital Signs: Vitals:   12/10/22 1400 12/10/22 1947 12/11/22 0333 12/11/22 0745  BP: (!) 158/51 (!) 164/96 (!) 153/78 (!) 156/92  Pulse: 62 71 97 98  Resp:  20 16 20   Temp: 97.9 F (36.6 C) 97.8 F (36.6 C) 99.5 F (37.5 C) 98.2 F (36.8 C)  TempSrc: Oral Oral Oral Oral  SpO2: 95% 95% 94%   Weight:   83.9 kg   Height:       Supplemental O2: Room Air  Filed Weights   12/09/22 0300 12/10/22 0414 12/11/22 0333  Weight: 82.2 kg 83.4 kg 83.9 kg     Intake/Output Summary (Last 24 hours) at 12/11/2022 1051 Last data filed at 12/11/2022 0900 Gross per 24 hour  Intake 940.33 ml  Output 3550 ml  Net -2609.67 ml   Net IO Since Admission: -5,224.67 mL [12/11/22 1051]  Physical Exam:   Physical Exam Constitutional:      General: She is not in acute distress.    Appearance: She is not ill-appearing, toxic-appearing or diaphoretic.     Comments: Sitting in bed , eating her breakfast ,not in any acute distress  Pulmonary:     Effort: Pulmonary effort is normal. No respiratory distress.     Breath sounds: No stridor. No wheezing.     Comments: Diffuse wet crackles appreciated lung exam  - Improved from before  Abdominal:     Palpations: Abdomen is soft. There is no mass.     Tenderness: There is no abdominal tenderness. There is no guarding or rebound.     Comments: Normoactive Bowel Sounds   Musculoskeletal:     Right lower leg: Edema present.     Left lower leg: Edema present.     Comments: Bilateral lower extremities pitting edema present. Fairly unchanged from yesterday  Neurological:     Mental Status: She is alert.  Psychiatric:        Mood and Affect: Mood normal.        Behavior: Behavior normal.        Thought Content: Thought content normal.     Patient Lines/Drains/Airways Status     Active Line/Drains/Airways     Name Placement date Placement time Site Days   Peripheral IV 03/25/21 20 G Right Hand 03/25/21  1409  Hand  622   Peripheral IV 12/06/22 20 G 1" Left;Posterior Hand 12/06/22  1803  Hand  1   Wound / Incision (Open or Dehisced) 03/24/21 Skin tear Arm Lower;Posterior;Right red, bleeding 03/24/21  1930  Arm  623             ASSESSMENT/PLAN:  Assessment: Principal Problem:   Atrial fibrillation with RVR (HCC)  Plan: #  Atrial fibrillation with RVR not on [facility]   #History of CVA 718 #History of cardioversion x4 Patient presented due to concerns for weakness and shortness of breath, found to be in A-fib with RVR S/p DCCV 12/10/2022 and has been in normal sinus ever since. Patient converted to a-flutter overnight, she however remained asymptomatic. Dr Camnitz,cardiology notified and recommends continuing diuresis at this time, potential plan of cardioversion again if patient continues to remain in A-flutter. Patient's pending AF ablation cannot be done inpatient per cardiology and is still scheduled for December 16th, 2024.If patient doesn't stay in SR after a potential 2nd DVVC during this admission, Cardiology will try to reschedule pt's outpatient AF ablation sooner.  -Cardiology recs appreciated -Continue amiodarone 400 mg daily -Continue  Eliquis -Telemetry  # Acute on chronic heart failure with preserved ejection fraction exacerbation   #CAD status post STEMI with RCA DES placement +2 pitting edema noted on lower extremities bilaterally today.  She however continues to be on room air with good saturations and denies any shortness of breath or chest pain.Kidney function remains stable and continues to have great urine output. - Will continue to diurese with IV Lasix 40 mg twice daily - Continue to monitor ins and out - Daily weight - Continue hydralazine 50 mg qh6 and Imdur 60 mg daily - Repeat BMP  #Mitral regurgitation #Severe tricuspid regurgitation Chronically moderate.Cardiology will repeat ECHO post ablation.  #Hypercalcemia  Patient is hypercalcemic,which seems to be chronic. Continues to deny any N/V, constipation at this time.No M-Spike noted on protein electrophoresis.  - F/U on PTH/ PTHrP    #UTI - Discontinue Ceftriaxone today - WCTM   # Chronic conditions  . Type 2 diabetes        - Continue on SSI, Blood glucose is been at goal so far,Daily CGM .  Hypertension        -Continue on home amlodipine 5 mg .  Hyperlipidemia        -Continue pravastatin 40 mg and Evocolcumab 140 mg injection  .  GERD        - Continue Protonix 40 mg daily and famotidine 40 mg daily .  Gout          -  Discontinue Febuxostat given history of CAD and risk for sudden cardiac death .  CKD stage IV         -Trend creatinine, avoid nephrotoxic agents .  Constipation          - Continue Colace 100 mg as needed .  Osteoporosis          - Patient takes denosumab 60 mg injection every 6 months, no changes needed at this time .  Anemia of chronic disease versus iron deficiency anemia        - Trend CBC,continue iron sulphate 325 mg daily with breakfast  Best Practice: Diet: Cardiac diet VTE: Place TED hose Start: 12/11/22 0944 apixaban (ELIQUIS) tablet 2.5 mg Start: 12/06/22 2200 Code: Full DISPO: Anticipated discharge  in  2 days to Home pending  Medical work up .  Signature: Kathleen Lime , MD Internal Medicine Resident, PGY-1 [facility]  Pager: [phone] 10:51 AM, 12/11/2022   Please contact the on call pager after 5 pm and on weekends at [phone].

## 2022-12-11 NOTE — Progress Notes (Addendum)
Patient Name: Amanda [name] Date of Encounter: 12/11/2022  Primary Cardiologist: Amanda Balsam, MD Electrophysiologist: Amanda Roskelley Jorja Loa, MD  Interval Summary   The patient is doing well today.  At this time, the patient denies chest pain, shortness of breath, or any new concerns.  Vital Signs    Vitals:   12/10/22 1217 12/10/22 1400 12/10/22 1947 12/11/22 0333  BP: (!) 170/64 (!) 158/51 (!) 164/96 (!) 153/78  Pulse: (!) 59 62 71 97  Resp: 20  20 16   Temp: 99 F (37.2 C) 97.9 F (36.6 C) 97.8 F (36.6 C) 99.5 F (37.5 C)  TempSrc: Oral Oral Oral Oral  SpO2: 95% 95% 95% 94%  Weight:    83.9 kg  Height:        Intake/Output Summary (Last 24 hours) at 12/11/2022 0638 Last data filed at 12/11/2022 0241 Gross per 24 hour  Intake 220.33 ml  Output 3450 ml  Net -3229.67 ml   Filed Weights   12/09/22 0300 12/10/22 0414 12/11/22 0333  Weight: 82.2 kg 83.4 kg 83.9 kg    Physical Exam    GEN- The patient is well appearing, alert and oriented x 3 today.   Lungs- Clear to ausculation bilaterally, normal work of breathing Cardiac- Irregularly irregular rate and rhythm, no murmurs, rubs or gallops GI- soft, NT, ND, + BS Extremities- no clubbing or cyanosis. No edema  Telemetry    SR 60's then ~1530 on 11/7, pt went back in to AF 90-110's (personally reviewed)  Hospital Course    Amanda [name] is a 82 y.o. 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  Found to have AFwRVR.    She had a DCCV in 09/2022 but had ERAF after 4 days. She was seen by Dr. Bing Matter in 10/2022 and amiodarone was decreased to 200mg  daily. Last seen by EP  11/16/22 and was in AF at that time. She was on amiodarone 200 mg daily at EP visit and planned for ablation 01/2023.  She had DCCV on 11/5 with return to AF on 11/7 am.   Assessment & Plan    Atrial Fibrillation  CHA2DS2-VASc 9 / 12.2% annual CVA risk. Not a tikosyn candidate with renal function.  -continue amiodarone 400mg  BID until ablation, no taper  -lasix 40mg  Q8 x2 doses 11/8 -needs ongoing diuresis for volume removal then repeat DCCV once euvolemic  -add TED hose  -follow I/O's, daily weight -ablation planned for 12/16   MDT PPM In Situ  Implanted 04/2019 for intermittent heart block.    Secondary Hypercoagulable State  Hx GIB  -continue Eliquis for stroke prophylaxis  -monitor for bleeding    Moderate-Severe MR Severe TR  -follow up as outpt   HTN  -per primary   CKD III/IV -per primary    OSA  -CPAP at bedtime       For questions or updates, please contact [facility] Please consult [email or web address] for contact info under Cardiology/STEMI.  Signed, Amanda Brim, MSN, APRN, NP-C, AGACNP-BC [facility] - Electrophysiology  12/11/2022, 10:33 AM  I have seen and examined this patient with Amanda [name].  Agree with above, note added to reflect my  findings.  Patient unfortunately went back into atrial fibrillation yesterday.  Shortness of breath mildly worse.  GEN: Well nourished, well developed, in no acute distress  HEENT: normal  Neck: no JVD, carotid bruits, or masses Cardiac: Irregular, 2+ edema Respiratory: Crackles  GI: soft, nontender, nondistended, + BS MS: no deformity or atrophy  Skin: warm and dry Neuro:  Strength and sensation are intact Psych: euthymic mood, full affect   Persistent atrial fibrillation: Patient is post cardioversion.  Reloading with amiodarone.  Unfortunately back in atrial fibrillation today.  Amanda [name] continue with diuresis and once better optimized, could consider repeat cardioversion prior to planned ablation. Acute  on chronic diastolic heart failure: Continues with volume overload.  Would continue IV Lasix for now.  Amanda [name] M. Amanda Soderberg MD 12/11/2022 5:00 PM

## 2022-12-11 NOTE — Progress Notes (Signed)
   12/11/22 0927  Mobility  Activity Ambulated independently in hallway  Level of Assistance Standby assist, set-up cues, supervision of patient - no hands on  Assistive Device Front wheel walker  Distance Ambulated (ft) 150 ft  Activity Response Tolerated fair  Mobility Referral Yes  $Mobility charge 1 Mobility  Mobility Specialist Start Time (ACUTE ONLY) 0907  Mobility Specialist Stop Time (ACUTE ONLY) 0927  Mobility Specialist Time Calculation (min) (ACUTE ONLY) 20 min   Mobility Specialist: Progress Note  Pre-Mobility: HR 95-111 Post-Mobility: HR 103-114   Pt agreeable to mobility session - received in EOB. Required SV using Rollator and one standing rest break. Fatigue EOS. Pt did not require oxygen during ambulation. Returned to EOB with all needs met - call bell within reach.  Physical Therapist requested Mobility Specialist to perform oxygen saturation test with pt which includes removing pt from oxygen both at rest and while ambulating. Below are the results from that testing.     Patient Saturations on Room Air at Rest = spO2 95%  Patient Saturations on Room Air while Ambulating = spO2 94% .    At end of testing pt left in room on RA.   Reported results to physical therapist.   Barnie Mort, BS Mobility Specialist Please contact via [employer] or Rehab office at [phone].

## 2022-12-11 NOTE — Progress Notes (Signed)
Physical Therapy Treatment Patient Details Name: Amanda [name] MRN: [id number] DOB: April 10, 1940 Today's Date: 12/11/2022   History of Present Illness 82 y.o. female presents to [facility] hospital on 12/06/2022 with SOB in the setting of afib. PMH includes afib, GIB, CVA, STEMI, severe TR, biventricular PPM, DMII, HTN, HL, GERD, ischemic colitis.    PT Comments  Pt admitted with above diagnosis. Pt ambulated with mobility this am.  This pm, fatigued however was able to be educated regarding HEP from [employer] program. Pt verbalizes understanding of exercises and can return demonstration. Continue to follow acutely.  Pt currently with functional limitations due to the deficits listed below (see PT Problem List). Pt will benefit from acute skilled PT to increase their independence and safety with mobility to allow discharge.       If plan is discharge home, recommend the following: Assistance with cooking/housework;Help with stairs or ramp for entrance   Can travel by private vehicle        Equipment Recommendations  Rollator (4 wheels)    Recommendations for Other Services       Precautions / Restrictions Precautions Precautions: Fall Precaution Comments: monitor sats Restrictions Weight Bearing Restrictions: No     Mobility  Bed Mobility Overal bed mobility: Independent                  Transfers Overall transfer level: Needs assistance Equipment used: Rollator (4 wheels) Transfers: Sit to/from Stand Sit to Stand: Modified independent (Device/Increase time)                Ambulation/Gait                   Stairs             Wheelchair Mobility     Tilt Bed    Modified Rankin (Stroke Patients Only)       Balance Overall balance assessment: Needs assistance Sitting-balance support: No upper extremity supported, Feet supported Sitting balance-Leahy Scale: Good     Standing balance support: During functional activity, Bilateral upper  extremity supported Standing balance-Leahy Scale: Poor Standing balance comment: relies on at least 1 UE support                            Cognition Arousal: Alert Behavior During Therapy: WFL for tasks assessed/performed Overall Cognitive Status: Within Functional Limits for tasks assessed                                          Exercises General Exercises - Lower Extremity Ankle Circles/Pumps: AROM, Both, 10 reps, Supine Long Arc Quad: AROM, Both, 10 reps, Seated Heel Slides: AROM, Both, 10 reps, Supine Hip ABduction/ADduction: AROM, Both, 10 reps, Supine Heel Raises: AROM, Both, 10 reps, Standing Mini-Sqauts: AROM, Both, 10 reps, Standing Other Exercises Other Exercises: March in place x 10 Other Exercises: Gave pt [employer] HEP handout [id number]    General Comments General comments (skin integrity, edema, etc.): Sats >90% on RA      Pertinent Vitals/Pain Pain Assessment Pain Assessment: No/denies pain    Home Living                          Prior Function            PT Goals (current goals can now  be found in the care plan section) Acute Rehab PT Goals Patient Stated Goal: to improve activity tolerance Progress towards PT goals: Progressing toward goals    Frequency    Min 1X/week      PT Plan      Co-evaluation              AM-PAC PT "6 Clicks" Mobility   Outcome Measure  Help needed turning from your back to your side while in a flat bed without using bedrails?: None Help needed moving from lying on your back to sitting on the side of a flat bed without using bedrails?: None Help needed moving to and from a bed to a chair (including a wheelchair)?: A Little Help needed standing up from a chair using your arms (e.g., wheelchair or bedside chair)?: A Little Help needed to walk in hospital room?: A Little Help needed climbing 3-5 steps with a railing? : A Little 6 Click Score: 20    End of Session  Equipment Utilized During Treatment: Gait belt Activity Tolerance: Patient limited by fatigue Patient left: in bed;with call bell/phone within reach (sitting EOB) Nurse Communication: Mobility status PT Visit Diagnosis: Other abnormalities of gait and mobility (R26.89)     Time: 1610-9604 PT Time Calculation (min) (ACUTE ONLY): 16 min  Charges:    $Therapeutic Exercise: 8-22 mins PT General Charges $$ ACUTE PT VISIT: 1 Visit                     [facility] M,PT Acute Rehab Services [phone]    [name] 12/11/2022, 2:30 PM

## 2022-12-11 NOTE — Plan of Care (Signed)

## 2022-12-12 DIAGNOSIS — I4891 Unspecified atrial fibrillation: Secondary | ICD-10-CM | POA: Diagnosis not present

## 2022-12-12 LAB — CBC
HCT: 29 % — ABNORMAL LOW (ref 36.0–46.0)
Hemoglobin: 8.9 g/dL — ABNORMAL LOW (ref 12.0–15.0)
MCH: 27.4 pg (ref 26.0–34.0)
MCHC: 30.7 g/dL (ref 30.0–36.0)
MCV: 89.2 fL (ref 80.0–100.0)
Platelets: 476 10*3/uL — ABNORMAL HIGH (ref 150–400)
RBC: 3.25 MIL/uL — ABNORMAL LOW (ref 3.87–5.11)
RDW: 16.6 % — ABNORMAL HIGH (ref 11.5–15.5)
WBC: 9 10*3/uL (ref 4.0–10.5)
nRBC: 0 % (ref 0.0–0.2)

## 2022-12-12 LAB — COMPREHENSIVE METABOLIC PANEL
ALT: 43 U/L (ref 0–44)
AST: 39 U/L (ref 15–41)
Albumin: 2.4 g/dL — ABNORMAL LOW (ref 3.5–5.0)
Alkaline Phosphatase: 122 U/L (ref 38–126)
Anion gap: 12 (ref 5–15)
BUN: 52 mg/dL — ABNORMAL HIGH (ref 8–23)
CO2: 23 mmol/L (ref 22–32)
Calcium: 12.2 mg/dL — ABNORMAL HIGH (ref 8.9–10.3)
Chloride: 103 mmol/L (ref 98–111)
Creatinine, Ser: 2.47 mg/dL — ABNORMAL HIGH (ref 0.44–1.00)
GFR, Estimated: 19 mL/min — ABNORMAL LOW (ref >60)
Glucose, Bld: 196 mg/dL — ABNORMAL HIGH (ref 70–99)
Potassium: 4.2 mmol/L (ref 3.5–5.1)
Sodium: 138 mmol/L (ref 135–145)
Total Bilirubin: 0.5 mg/dL (ref <1.2)
Total Protein: 6.6 g/dL (ref 6.5–8.1)

## 2022-12-12 LAB — GLUCOSE, CAPILLARY
Glucose-Capillary: 171 mg/dL — ABNORMAL HIGH (ref 70–99)
Glucose-Capillary: 211 mg/dL — ABNORMAL HIGH (ref 70–99)
Glucose-Capillary: 186 mg/dL — ABNORMAL HIGH (ref 70–99)
Glucose-Capillary: 204 mg/dL — ABNORMAL HIGH (ref 70–99)

## 2022-12-12 MED ORDER — FUROSEMIDE 10 MG/ML IJ SOLN
40.0000 mg | Freq: Once | INTRAMUSCULAR | Status: AC
  Administered 2022-12-12: 40 mg via INTRAVENOUS
  Filled 2022-12-12: qty 4

## 2022-12-12 NOTE — Progress Notes (Signed)
Mobility Specialist Progress Note:   12/12/22 1300  Mobility  Activity Ambulated with assistance in hallway  Level of Assistance  (MinG)  Assistive Device Four wheel walker  Distance Ambulated (ft) 225 ft  Activity Response Tolerated well  Mobility Referral Yes  $Mobility charge 1 Mobility  Mobility Specialist Start Time (ACUTE ONLY) 1208  Mobility Specialist Stop Time (ACUTE ONLY) 1225  Mobility Specialist Time Calculation (min) (ACUTE ONLY) 17 min   Pre Mobility: 104 HR , 100% SpO2 During Mobility: 116 HR , 86%-91% SpO2 Post Mobility: 105 HR , 97% SpO2  Pt received EOB, agreeable to mobility. 2x seated rest breaks during ambulation d/t leg weakness. Pt c/o slight leg and back pain, otherwise asymptomatic throughout. Pt desat to 86% on RA but with pursed lip breathing quickly elevated to 91%. Pt returned to bed with call bell in reach and all needs met. Family present.  Leory Plowman  Mobility Specialist Please contact via [employer] office at [phone]

## 2022-12-12 NOTE — Progress Notes (Addendum)
HD#5 SUBJECTIVE:  Patient Summary: Amanda [name] is a 82 y.o. with a pertinent PMH of A-fib status post cardioversion, CVA, CAD status post STEMI, not on any anticoagulation, who presented with shortness of breath and fatigue and admitted for atrial fibrillation with RVR s/p DCCV 12/08/2022.Hospital course complicated by bacteruria and started on ceftriaxone. End date is 12/10/2022   Overnight Events:  No events overnight  Interim History:  Patient is seen at bed side , sitting on bed , in no acute distress. She reported calf pain more on the left than the right. She thinks the pain is due her compression stockings. Other than she didn't have any other complaints. Denies any chest pain or shortness of breath.   OBJECTIVE:  Vital Signs: Vitals:   12/11/22 2107 12/12/22 0338 12/12/22 0536 12/12/22 0845  BP: (!) 152/87 (!) 144/55 (!) 161/77 (!) 166/103  Pulse:  87  (!) 104  Resp:  20  18  Temp:  99.9 F (37.7 C)  97.7 F (36.5 C)  TempSrc:  Oral  Oral  SpO2:  95%  96%  Weight:  81.4 kg    Height:       Supplemental O2: Room Air  Filed Weights   12/10/22 0414 12/11/22 0333 12/12/22 0338  Weight: 83.4 kg 83.9 kg 81.4 kg     Intake/Output Summary (Last 24 hours) at 12/12/2022 1023 Last data filed at 12/11/2022 1900 Gross per 24 hour  Intake --  Output 1950 ml  Net -1950 ml   Net IO Since Admission: -7,174.67 mL [12/12/22 1023]  Physical Exam:    Physical Exam Constitutional:      General: She is not in acute distress.    Appearance: She is not ill-appearing, toxic-appearing or diaphoretic.     Comments: Sitting on her bed  ,not in any acute distress  Pulmonary:     Effort: Pulmonary effort is normal. No respiratory distress.     Breath sounds: No stridor. No wheezing.     Comments: Crackles still noted at lower lobes bilaterally.  Abdominal:     Palpations: Abdomen is soft. There is no mass.     Tenderness: There is no abdominal tenderness. There is no guarding or  rebound.  Musculoskeletal:     Right lower leg: Edema present.     Left lower leg: Edema present.     Comments: Bilateral lower extremities pitting edema present.  Neurological:     Mental Status: She is alert.  Psychiatric:        Mood and Affect: Mood normal.        Behavior: Behavior normal.        Thought Content: Thought content normal.     Patient Lines/Drains/Airways Status     Active Line/Drains/Airways     Name Placement date Placement time Site Days   Peripheral IV 03/25/21 20 G Right Hand 03/25/21  1409  Hand  622   Peripheral IV 12/06/22 20 G 1" Left;Posterior Hand 12/06/22  1803  Hand  1   Wound / Incision (Open or Dehisced) 03/24/21 Skin tear Arm Lower;Posterior;Right red, bleeding 03/24/21  1930  Arm  623             ASSESSMENT/PLAN:  Assessment: Principal Problem:   Atrial fibrillation with RVR (HCC)  Plan: # Atrial fibrillation with RVR not on AC    #History of CVA 718 #History of cardioversion x4 Patient remains in Afib this morning but asymptomatic,getting her Eliquis as scheduled. Cardiology plan is  to cardiovert again if she doesn't revert to SR after adequate diuresing. Will continue to monitor .  - F/U on cradiology recs - Continue diuresis below  - Continue amiodarone 400 mg BID  - Continue telemetry   - Continue Eliquis   # Acute on chronic heart failure with preserved ejection fraction exacerbation  #CAD status post STEMI with RCA DES placement Patient continues to have good urine output, still volume overloaded with 2+ edema. She however remains asymptomatic. Will continue to diurese and closely monitor kidney function - Continue IV LASIX 40 mg BID - Strict ins and outs  - Daily weights  - Hydralazine  50 mg qh6 and Imdur 60 mg daily - AM BMPs  #Bilateral calf pain Patient reports bilateral calf pain, worse on the left calf. She also had compression socks for the fist time and I suspect her calf pain is due to the socks. Patient has  been on Eliquis during this hospitalization and has been in and out of bed working with the mobility specialist every day.Patient denies chest pain and is not dyspneic at this time, low suspicions for a DVT at this time. Will discontinue the compression stocking today and reassess her calf pain again.   #Mitral regurgitation  #Severe tricuspid regurgitation Chronically moderate.Cardiology will repeat ECHO post ablation.  #Hypercalcemia   Patient is hypercalcemic,which seems to be chronic. No M-Spike noted on protein electrophoresis. PTH is elevated at 84 which is concerning for primary hyperparathyroidism.  Wondering if this patient could benefit from Cinacalcet but  she is asymptomatic at this time.Patient does take Prolia shot every 6 months .Will continue to monitor .   # Chronic conditions  . Type 2 diabetes        - Continue on SSI, Blood glucose is been at goal so far,Daily CGM .  Hypertension        -Continue on home amlodipine 5 mg .  Hyperlipidemia        -Continue pravastatin 40 mg and Evocolcumab 140 mg injection  .  GERD        - Continue Protonix 40 mg daily and famotidine 40 mg daily .  Gout          -  Discontinue Febuxostat given history of CAD and risk for sudden cardiac death .  CKD stage IV         -Trend creatinine, avoid nephrotoxic agents .  Constipation          - Continue Colace 100 mg as needed .  Osteoporosis          - Patient takes denosumab 60 mg injection every 6 months, no changes needed at this time .  Anemia of chronic disease versus iron deficiency anemia        - Trend CBC,continue iron sulphate 325 mg daily with breakfast  Best Practice: Diet: Cardiac diet VTE: Place TED hose Start: 12/11/22 0944 apixaban (ELIQUIS) tablet 2.5 mg Start: 12/06/22 2200 Code: Full DISPO: Anticipated discharge in  2 days to Home pending  Medical work up .  Signature: Kathleen Lime , MD Internal Medicine Resident, PGY-1 [facility]   Pager: [phone] 10:23 AM, 12/12/2022   Please contact the on call pager after 5 pm and on weekends at [phone].

## 2022-12-12 NOTE — Progress Notes (Signed)
   Patient Name: Amanda [name] Date of Encounter: 12/12/2022 [facility] Cardiologist: Gypsy Balsam, MD   Interval Summary  .    Early arrhythmia recurrence after cardioversion.  Remains in atrial fibrillation with mediocre ventricular rate control (ventricular rates 90-110 bpm).  On IV amiodarone. Good diuresis with net fluid loss of 1.7 L in last 24 hours and 7 L since admission.  Weight down 2.5 kg since admission (suspect that yesterday's weight was erroneous).  Vital Signs .    Vitals:   12/11/22 2107 12/12/22 0338 12/12/22 0536 12/12/22 0845  BP: (!) 152/87 (!) 144/55 (!) 161/77 (!) 166/103  Pulse:  87  (!) 104  Resp:  20  18  Temp:  99.9 F (37.7 C)  97.7 F (36.5 C)  TempSrc:  Oral  Oral  SpO2:  95%  96%  Weight:  81.4 kg    Height:        Intake/Output Summary (Last 24 hours) at 12/12/2022 1105 Last data filed at 12/11/2022 1900 Gross per 24 hour  Intake --  Output 1950 ml  Net -1950 ml      12/12/2022    3:38 AM 12/11/2022    3:33 AM 12/10/2022    4:14 AM  Last 3 Weights  Weight (lbs) 179 lb 8 oz 185 lb 183 lb 14.4 oz  Weight (kg) 81.421 kg 83.915 kg 83.416 kg      Telemetry/ECG    A-fib with RVR- Personally Reviewed  Physical Exam .   GEN: No acute distress.   Neck: No JVD Cardiac: Irregular, no murmurs, rubs, or gallops.  Respiratory: Clear to auscultation bilaterally. GI: Soft, nontender, non-distended  MS: No edema  Assessment & Plan .     82 year old woman presenting with acute exacerbation of chronic diastolic heart failure in the setting of persistent atrial fibrillation, early recurrence of arrhythmia following cardioversion.  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  In the setting of severe hypervolemia she has moderate-severe MR and severe TR.  Has chronic kidney disease stage IV with a baseline creatinine  around 2.0-2.2 (GFR 20-25), current slight AKI with active diuresis with a creatinine that has been steady around 2.4-2.5 for the last several days.  Has OSA, on CPAP.  Plan is for continued diuresis and loading with intravenous amiodarone to optimize chances of successful maintenance of sinus rhythm after another cardioversion next week, with a long-term intention to perform A-fib ablation in December.  For questions or updates, please contact [facility] Please consult [email or web address] for contact info under        Signed, Thurmon Fair, MD

## 2022-12-13 DIAGNOSIS — I4891 Unspecified atrial fibrillation: Secondary | ICD-10-CM | POA: Diagnosis not present

## 2022-12-13 DIAGNOSIS — I5030 Unspecified diastolic (congestive) heart failure: Secondary | ICD-10-CM | POA: Insufficient documentation

## 2022-12-13 HISTORY — DX: Unspecified diastolic (congestive) heart failure: I50.30

## 2022-12-13 LAB — RENAL FUNCTION PANEL
Albumin: 2.3 g/dL — ABNORMAL LOW (ref 3.5–5.0)
Anion gap: 10 (ref 5–15)
BUN: 53 mg/dL — ABNORMAL HIGH (ref 8–23)
CO2: 25 mmol/L (ref 22–32)
Calcium: 11.8 mg/dL — ABNORMAL HIGH (ref 8.9–10.3)
Chloride: 105 mmol/L (ref 98–111)
Creatinine, Ser: 2.61 mg/dL — ABNORMAL HIGH (ref 0.44–1.00)
GFR, Estimated: 18 mL/min — ABNORMAL LOW (ref >60)
Glucose, Bld: 167 mg/dL — ABNORMAL HIGH (ref 70–99)
Phosphorus: 4.3 mg/dL (ref 2.5–4.6)
Potassium: 4.4 mmol/L (ref 3.5–5.1)
Sodium: 140 mmol/L (ref 135–145)

## 2022-12-13 LAB — MAGNESIUM: Magnesium: 2.4 mg/dL (ref 1.7–2.4)

## 2022-12-13 LAB — CBC
HCT: 27.8 % — ABNORMAL LOW (ref 36.0–46.0)
Hemoglobin: 8.3 g/dL — ABNORMAL LOW (ref 12.0–15.0)
MCH: 26.8 pg (ref 26.0–34.0)
MCHC: 29.9 g/dL — ABNORMAL LOW (ref 30.0–36.0)
MCV: 89.7 fL (ref 80.0–100.0)
Platelets: 422 10*3/uL — ABNORMAL HIGH (ref 150–400)
RBC: 3.1 MIL/uL — ABNORMAL LOW (ref 3.87–5.11)
RDW: 16.6 % — ABNORMAL HIGH (ref 11.5–15.5)
WBC: 6.8 10*3/uL (ref 4.0–10.5)
nRBC: 0 % (ref 0.0–0.2)

## 2022-12-13 LAB — GLUCOSE, CAPILLARY
Glucose-Capillary: 174 mg/dL — ABNORMAL HIGH (ref 70–99)
Glucose-Capillary: 222 mg/dL — ABNORMAL HIGH (ref 70–99)
Glucose-Capillary: 159 mg/dL — ABNORMAL HIGH (ref 70–99)
Glucose-Capillary: 162 mg/dL — ABNORMAL HIGH (ref 70–99)

## 2022-12-13 MED ORDER — SODIUM CHLORIDE 0.9 % IV SOLN: INTRAVENOUS | Status: DC

## 2022-12-13 MED ORDER — TORSEMIDE 20 MG PO TABS
10.0000 mg | ORAL_TABLET | Freq: Two times a day (BID) | ORAL | Status: DC
  Administered 2022-12-14 – 2022-12-15 (×3): 10 mg via ORAL
  Filled 2022-12-13 (×3): qty 1

## 2022-12-13 MED ORDER — SENNA 8.6 MG PO TABS
1.0000 | ORAL_TABLET | Freq: Every day | ORAL | Status: DC
  Administered 2022-12-13 – 2022-12-15 (×2): 8.6 mg via ORAL
  Filled 2022-12-13 (×2): qty 1

## 2022-12-13 MED ORDER — PANTOPRAZOLE SODIUM 40 MG PO TBEC
40.0000 mg | DELAYED_RELEASE_TABLET | Freq: Every day | ORAL | Status: DC
  Administered 2022-12-13 – 2022-12-15 (×3): 40 mg via ORAL
  Filled 2022-12-13 (×3): qty 1

## 2022-12-13 MED ORDER — FUROSEMIDE 10 MG/ML IJ SOLN
40.0000 mg | Freq: Once | INTRAMUSCULAR | Status: AC
  Administered 2022-12-13: 40 mg via INTRAVENOUS
  Filled 2022-12-13: qty 4

## 2022-12-13 NOTE — H&P (View-Only) (Signed)
   Patient Name: Amanda [name] Date of Encounter: 12/13/2022 [facility] Cardiologist: Gypsy Balsam, MD   Interval Summary  .    No major complaints at rest, but does not feel great (this is always the case when she has persistent atrial fibrillation).  Ventricular rate mostly in the 80s-90s at rest. Little change in BUN and creatinine, but creatinine very slightly worse. Net diuresis almost 8 L since admission, net -600 mL in last 24 hours.  Vital Signs .    Vitals:   12/13/22 0251 12/13/22 0258 12/13/22 0604 12/13/22 0802  BP:  (!) 164/72 (!) 159/82   Pulse:  100  (!) 106  Resp:  20  20  Temp:  97.8 F (36.6 C)  99.8 F (37.7 C)  TempSrc:  Oral  Oral  SpO2:    97%  Weight: 81.1 kg     Height:        Intake/Output Summary (Last 24 hours) at 12/13/2022 1149 Last data filed at 12/13/2022 0610 Gross per 24 hour  Intake 720 ml  Output 650 ml  Net 70 ml      12/13/2022    2:51 AM 12/12/2022    3:38 AM 12/11/2022    3:33 AM  Last 3 Weights  Weight (lbs) 178 lb 14.4 oz 179 lb 8 oz 185 lb  Weight (kg) 81.149 kg 81.421 kg 83.915 kg      Telemetry/ECG    Atrial Fibrillation with fair ventricular rate control- Personally Reviewed  Physical Exam .   GEN: No acute distress.   Neck: No JVD Cardiac: Irregular , no murmurs, rubs, or gallops.  Respiratory: Clear to auscultation bilaterally. GI: Soft, nontender, non-distended  MS: No edema  Assessment & Plan .    82 year old woman presenting with acute exacerbation of chronic diastolic heart failure in the setting of persistent atrial fibrillation, early recurrence of arrhythmia following cardioversion.  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  In the setting of severe hypervolemia she has moderate-severe MR and severe TR.  Has chronic kidney disease stage IV with a baseline creatinine  around 2.0-2.2 (GFR 20-25), current slight AKI with active diuresis with a creatinine..  Has OSA, on CPAP.    She appears to be close to euvolemia.  I do not think we can push diuretics any further without causing significant worsening of kidney function.  Adequate rate control on current amiodarone dose.  Still symptomatic.  Will try to get her scheduled for cardioversion next week, if the schedule allows as early as tomorrow morning.  I asked her not to eat anything after midnight tonight, until we find out whether there is a chance for cardioversion tomorrow.  This procedure has been fully reviewed with the patient and informed consent has been obtained.   Long-term plan is A-fib ablation in December.  For questions or updates, please contact [facility] Please consult [email or web address] for contact info under        Signed, Thurmon Fair, MD

## 2022-12-13 NOTE — Progress Notes (Addendum)
   Patient Name: Amanda [name] Date of Encounter: 12/13/2022 [facility] Cardiologist: Gypsy Balsam, MD   Interval Summary  .    No major complaints at rest, but does not feel great (this is always the case when she has persistent atrial fibrillation).  Ventricular rate mostly in the 80s-90s at rest. Little change in BUN and creatinine, but creatinine very slightly worse. Net diuresis almost 8 L since admission, net -600 mL in last 24 hours.  Vital Signs .    Vitals:   12/13/22 0251 12/13/22 0258 12/13/22 0604 12/13/22 0802  BP:  (!) 164/72 (!) 159/82   Pulse:  100  (!) 106  Resp:  20  20  Temp:  97.8 F (36.6 C)  99.8 F (37.7 C)  TempSrc:  Oral  Oral  SpO2:    97%  Weight: 81.1 kg     Height:        Intake/Output Summary (Last 24 hours) at 12/13/2022 1149 Last data filed at 12/13/2022 0610 Gross per 24 hour  Intake 720 ml  Output 650 ml  Net 70 ml      12/13/2022    2:51 AM 12/12/2022    3:38 AM 12/11/2022    3:33 AM  Last 3 Weights  Weight (lbs) 178 lb 14.4 oz 179 lb 8 oz 185 lb  Weight (kg) 81.149 kg 81.421 kg 83.915 kg      Telemetry/ECG    Atrial Fibrillation with fair ventricular rate control- Personally Reviewed  Physical Exam .   GEN: No acute distress.   Neck: No JVD Cardiac: Irregular , no murmurs, rubs, or gallops.  Respiratory: Clear to auscultation bilaterally. GI: Soft, nontender, non-distended  MS: No edema  Assessment & Plan .    82 year old woman presenting with acute exacerbation of chronic diastolic heart failure in the setting of persistent atrial fibrillation, early recurrence of arrhythmia following cardioversion.  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  In the setting of severe hypervolemia she has moderate-severe MR and severe TR.  Has chronic kidney disease stage IV with a baseline creatinine  around 2.0-2.2 (GFR 20-25), current slight AKI with active diuresis with a creatinine..  Has OSA, on CPAP.    She appears to be close to euvolemia.  I do not think we can push diuretics any further without causing significant worsening of kidney function.  Adequate rate control on current amiodarone dose.  Still symptomatic.  Will try to get her scheduled for cardioversion next week, if the schedule allows as early as tomorrow morning.  I asked her not to eat anything after midnight tonight, until we find out whether there is a chance for cardioversion tomorrow.  This procedure has been fully reviewed with the patient and informed consent has been obtained.   Long-term plan is A-fib ablation in December.  For questions or updates, please contact [facility] Please consult [email or web address] for contact info under        Signed, Thurmon Fair, MD

## 2022-12-13 NOTE — Progress Notes (Signed)
Hospital day#6 Subjective:   Summary: Amanda [name] is an 82 yo person with a complex history of atrial fibrillation s/p multiple cardioversions, ablation and on AC,  CAD s/p STEMI, who was admitted for AonCHF and atrial fibrillation with RVR s/p cardioversion during this hospitalization with return to atrial fibrillation, currently being diureses prior to second attempt to cardioversion during this admission  Overnight Events: None  Patient reports worsening of lower extremity swelling bilaterally, and shortness of breath requiring continuous supplemental O2 at rest and with ambulation. The pain in her lower extremities has improved otherwise. No chest pain or palpitation she can feel. Tolerating PO intake. Still is ambulating to bathroom. Hat for urine output measuring in bathroom. No nausea or vomiting.  Objective:  Vital signs in last 24 hours: Vitals:   12/13/22 0251 12/13/22 0258 12/13/22 0604 12/13/22 0802  BP:  (!) 164/72 (!) 159/82   Pulse:  100  (!) 106  Resp:  20  20  Temp:  97.8 F (36.6 C)  99.8 F (37.7 C)  TempSrc:  Oral  Oral  SpO2:    97%  Weight: 81.1 kg     Height:       Supplemental O2: Nasal Cannula SpO2: 97 % O2 Flow Rate (L/min): 2 L/min Filed Weights   12/11/22 0333 12/12/22 0338 12/13/22 0251  Weight: 83.9 kg 81.4 kg 81.1 kg    Physical Exam:  Constitutional:Elderly woman wearing [place] in no distress HENT: normocephalic atraumatic, Moist mucous membranes Neck: supple, no JVD Cardiovascular: rirregular rate, no m/r/g Pulmonary/Chest: Mild increased in WOB, speaking in full sentences. Bilateral crackles no wheezing.  Abdominal: Present BS, soft, non-tender, non-distended.  MSK: normal bulk and tone. 1+ pitting edema Neurological: alert & oriented x 3, moving all extremities Skin: warm and dry Psych: Pleasant mood and affect    Intake/Output Summary (Last 24 hours) at 12/13/2022 0950 Last data filed at 12/13/2022 0610 Gross per 24 hour   Intake 720 ml  Output 1350 ml  Net -630 ml   Net IO Since Admission: -7,804.67 mL [12/13/22 0950]  Pertinent Labs:    Latest Ref Rng & Units 12/13/2022    4:18 AM 12/12/2022    3:31 AM 12/11/2022    4:04 AM  CBC  WBC 4.0 - 10.5 K/uL 6.8  9.0  8.1   Hemoglobin 12.0 - 15.0 g/dL 8.3  8.9  8.6   Hematocrit 36.0 - 46.0 % 27.8  29.0  27.6   Platelets 150 - 400 K/uL 422  476  422        Latest Ref Rng & Units 12/13/2022    4:18 AM 12/12/2022    3:31 AM 12/11/2022    4:04 AM  CMP  Glucose 70 - 99 mg/dL 161  096  045   BUN 8 - 23 mg/dL 53  52  58   Creatinine 0.44 - 1.00 mg/dL 4.09  8.11  9.14   Sodium 135 - 145 mmol/L 140  138  137   Potassium 3.5 - 5.1 mmol/L 4.4  4.2  4.4   Chloride 98 - 111 mmol/L 105  103  107   CO2 22 - 32 mmol/L 25  23  22    Calcium 8.9 - 10.3 mg/dL 78.2  95.6  21.3   Total Protein 6.5 - 8.1 g/dL  6.6    Total Bilirubin <1.2 mg/dL  0.5    Alkaline Phos 38 - 126 U/L  122    AST 15 - 41  U/L  39    ALT 0 - 44 U/L  43      Imaging: No results found.  Assessment/Plan:   Principal Problem:   Atrial fibrillation with RVR (HCC) Active Problems:   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)   (HFpEF) heart failure with preserved ejection fraction (HCC)  Atrial Fibrillation Acute on Chronic HFpEF High grade Heartblock Dual chamber Medtronic  S/p cardioversion during this admission with recurrence of atrial fibrillation thought to be in setting of volume overload. Patient has been on lasix 40 mg BID. 178 lb today with mildly decreased urine output ; however, patient with continued signs of volume overload on exam today and still in atrial fibrillation. Will add one dose of IV lasix this AM and monitor for signs of volume contraction with following RFPs. Her Cr today has increased, see below. Appreciate cardiology's assistance with this case -Amiodarone 400 mg BID -Hydralazine 50 mg q6 hours -Imdur 60 mg daily -IV lasix 40 mg this  AM -Continue Eliquis 2.5 mg BID -Strict ins/outs -Daily standing weights -O2 saturation with ambulation  CKD4 Increase in Cr 2.61 from 2.47 yesterday and GFR 18 now from 22 on admission. No overt signs of alkalosis today. Likely in the setting of continued diuresis for 5 days with BID IV lasix. Will continue diuresis now with close monitoring -RFP this afternoon prior to second dose of lasix -Monitor in/out -Strict ins/outs  Hypothyroidism -Levothyroxine 50 mcg daily  HTN HLD -Pravastatin, amlodipine, and meds above.  Hx of GI bleed GERD Hgb stable since admission. Dr. Chales Abrahams aware of re initiation of anticoagulation -added back Protonix  Diet: Heart Healthy VTE: NOAC Code: Full PT/OT recs: Home health PT   Dispo: Anticipated discharge to Home in 2 days pending clinical improvement.   [name], MD Internal Medicine Resident PGY-2 Please contact the on call pager after 5 pm and on weekends at [phone].

## 2022-12-13 NOTE — Progress Notes (Signed)
Mobility Specialist Progress Note:   12/13/22 1315  Mobility  Activity Ambulated with assistance in hallway;Ambulated with assistance to bathroom  Level of Assistance  (MinG)  Assistive Device Four wheel walker  Distance Ambulated (ft) 200 ft  Activity Response Tolerated well  Mobility Referral Yes  $Mobility charge 1 Mobility  Mobility Specialist Start Time (ACUTE ONLY) 1230  Mobility Specialist Stop Time (ACUTE ONLY) 1250  Mobility Specialist Time Calculation (min) (ACUTE ONLY) 20 min   Pre Mobility: 91 HR , 100% SpO2 RA During Mobility: 119 HR , 92%-95% SpO2 RA Post Mobility: 102 HR   Pt received in bed, c/o feeling tired today but agreeable to mobility. 2x standing rest break d/t fatigue and L shoulder pain. Ambulated on RA, SpO2 stayed >90%. When returning to room pt requesting to use BR. Void Successful. Pt returned to bed with call bell in reach and all needs met.    Leory Plowman  Mobility Specialist Please contact via [employer] office at [phone]

## 2022-12-14 ENCOUNTER — Encounter (HOSPITAL_COMMUNITY)
Admission: EM | Disposition: A | Discharge status: Home Health | Payer: Self-pay | Source: Home / Self Care | Attending: Internal Medicine

## 2022-12-14 ENCOUNTER — Inpatient Hospital Stay (HOSPITAL_COMMUNITY): Payer: Medicare Other | Admitting: Certified Registered"

## 2022-12-14 ENCOUNTER — Encounter (HOSPITAL_COMMUNITY): Payer: Self-pay | Admitting: Internal Medicine

## 2022-12-14 ENCOUNTER — Other Ambulatory Visit: Payer: Self-pay | Admitting: *Deleted

## 2022-12-14 ENCOUNTER — Telehealth: Payer: Self-pay

## 2022-12-14 DIAGNOSIS — I4891 Unspecified atrial fibrillation: Secondary | ICD-10-CM

## 2022-12-14 DIAGNOSIS — R932 Abnormal findings on diagnostic imaging of liver and biliary tract: Secondary | ICD-10-CM

## 2022-12-14 HISTORY — PX: CARDIOVERSION: EP1203

## 2022-12-14 LAB — UPEP/UIFE/LIGHT CHAINS/TP, 24-HR UR
% BETA, Urine: 9.7 %
ALPHA 1 URINE: 1.9 %
Albumin, U: 74.4 %
Alpha 2, Urine: 8.1 %
Free Kappa Lt Chains,Ur: 37.97 mg/L (ref 1.17–86.46)
Free Kappa/Lambda Ratio: 3.61 (ref 1.83–14.26)
Free Lambda Lt Chains,Ur: 10.51 mg/L (ref 0.27–15.21)
GAMMA GLOBULIN URINE: 6 %
Total Protein, Urine-Ur/day: 396 mg/(24.h) — ABNORMAL HIGH (ref 30–150)
Total Protein, Urine: 19.8 mg/dL
Total Volume: 2000

## 2022-12-14 LAB — RENAL FUNCTION PANEL
Albumin: 2.1 g/dL — ABNORMAL LOW (ref 3.5–5.0)
Anion gap: 12 (ref 5–15)
BUN: 48 mg/dL — ABNORMAL HIGH (ref 8–23)
CO2: 22 mmol/L (ref 22–32)
Calcium: 11.1 mg/dL — ABNORMAL HIGH (ref 8.9–10.3)
Chloride: 104 mmol/L (ref 98–111)
Creatinine, Ser: 2.26 mg/dL — ABNORMAL HIGH (ref 0.44–1.00)
GFR, Estimated: 21 mL/min — ABNORMAL LOW (ref >60)
Glucose, Bld: 116 mg/dL — ABNORMAL HIGH (ref 70–99)
Phosphorus: 3.5 mg/dL (ref 2.5–4.6)
Potassium: 4.2 mmol/L (ref 3.5–5.1)
Sodium: 138 mmol/L (ref 135–145)

## 2022-12-14 LAB — CBC
HCT: 25 % — ABNORMAL LOW (ref 36.0–46.0)
Hemoglobin: 7.7 g/dL — ABNORMAL LOW (ref 12.0–15.0)
MCH: 27.4 pg (ref 26.0–34.0)
MCHC: 30.8 g/dL (ref 30.0–36.0)
MCV: 89 fL (ref 80.0–100.0)
Platelets: 348 10*3/uL (ref 150–400)
RBC: 2.81 MIL/uL — ABNORMAL LOW (ref 3.87–5.11)
RDW: 16.5 % — ABNORMAL HIGH (ref 11.5–15.5)
WBC: 7 10*3/uL (ref 4.0–10.5)
nRBC: 0 % (ref 0.0–0.2)

## 2022-12-14 LAB — GLUCOSE, CAPILLARY
Glucose-Capillary: 127 mg/dL — ABNORMAL HIGH (ref 70–99)
Glucose-Capillary: 134 mg/dL — ABNORMAL HIGH (ref 70–99)
Glucose-Capillary: 205 mg/dL — ABNORMAL HIGH (ref 70–99)
Glucose-Capillary: 202 mg/dL — ABNORMAL HIGH (ref 70–99)

## 2022-12-14 LAB — MAGNESIUM: Magnesium: 2.3 mg/dL (ref 1.7–2.4)

## 2022-12-14 SURGERY — CARDIOVERSION (CATH LAB)
Anesthesia: General

## 2022-12-14 MED ORDER — PROPOFOL 10 MG/ML IV BOLUS
INTRAVENOUS | Status: DC | PRN
  Administered 2022-12-14: 60 mg via INTRAVENOUS

## 2022-12-14 MED ORDER — DIPHENHYDRAMINE HCL 25 MG PO CAPS
25.0000 mg | ORAL_CAPSULE | Freq: Once | ORAL | Status: AC
  Administered 2022-12-14: 25 mg via ORAL
  Filled 2022-12-14: qty 1

## 2022-12-14 MED ORDER — HYDROXYZINE HCL 10 MG PO TABS: 10.0000 mg | ORAL_TABLET | Freq: Three times a day (TID) | ORAL | Status: DC | PRN

## 2022-12-14 MED ORDER — HYDROCORTISONE 1 % EX CREA
1.0000 | TOPICAL_CREAM | Freq: Three times a day (TID) | CUTANEOUS | Status: DC | PRN
  Administered 2022-12-15: 1 via TOPICAL
  Filled 2022-12-14: qty 28

## 2022-12-14 MED ORDER — LIDOCAINE 2% (20 MG/ML) 5 ML SYRINGE
INTRAMUSCULAR | Status: DC | PRN
  Administered 2022-12-14: 40 mg via INTRAVENOUS

## 2022-12-14 SURGICAL SUPPLY — 1 items: PAD DEFIB RADIO PHYSIO CONN (PAD) ×1 IMPLANT

## 2022-12-14 NOTE — Anesthesia Postprocedure Evaluation (Signed)
Anesthesia Post Note  Patient: Unknown Kuschel Lekas  Procedure(s) Performed: CARDIOVERSION (CATH LAB)     Patient location during evaluation: PACU Anesthesia Type: General Level of consciousness: awake and alert Pain management: pain level controlled Vital Signs Assessment: post-procedure vital signs reviewed and stable Respiratory status: spontaneous breathing, nonlabored ventilation, respiratory function stable and patient connected to nasal cannula oxygen Cardiovascular status: blood pressure returned to baseline and stable Postop Assessment: no apparent nausea or vomiting Anesthetic complications: no   No notable events documented.  Last Vitals:  Vitals:   12/14/22 1055 12/14/22 1102  BP: (!) 126/49 (!) 132/49  Pulse: (!) 56 (!) 54  Resp: 15 18  Temp:    SpO2: 94% 94%    Last Pain:  Vitals:   12/14/22 1034  TempSrc: Temporal  PainSc: 0-No pain                 Earl Lites P Cynde Menard

## 2022-12-14 NOTE — Progress Notes (Signed)
PT Cancellation Note  Patient Details Name: Amanda [name] MRN: [id number] DOB: 25-Jul-1940   Cancelled Treatment:    Reason Eval/Treat Not Completed: (P) Patient at procedure or test/unavailable Pt is off floor for cardioversion. PT will follow back this afternoon as able.  Reyaan Thoma B. Beverely Risen PT, DPT Acute Rehabilitation Services Please use secure chat or  Call Office [phone]   Elon Alas [facility] 12/14/2022, 10:13 AM

## 2022-12-14 NOTE — Progress Notes (Addendum)
Physical Therapy Treatment Patient Details Name: Amanda [name] MRN: [id number] DOB: 07-01-1940 Today's Date: 12/14/2022   History of Present Illness 81 y.o. female presents to [facility] hospital on 12/06/2022 with SOB in the setting of afib. s/p 11/11 cardioversion PMH includes afib, GIB, CVA, STEMI, severe TR, biventricular PPM, DMII, HTN, HL, GERD, ischemic colitis.    PT Comments  Pt up in bathroom on her own, as no one came in time, small amount of stool on the floor. Assisted pt in rear pericare, pt able to perform front pericare while holding onto grab bar in the bathroom. Afterward ambulated to bed with Rollator, where RN took her vital signs. Pt agreeable to short bout of ambulation as she is very tired after her procedure this morning. Pt able to ambulate with Rollator with supervision in hallway including 1x seated rest break with good technique for turning and sitting and getting back up again. Pt left in bed with bed alarm on and request to wait for assistance with going to the bathroom in the future. D/c plan is appropriate. PT will continue to follow acutely.     If plan is discharge home, recommend the following: Assistance with cooking/housework;Help with stairs or ramp for entrance   Can travel by private vehicle      Yes  Equipment Recommendations  Rollator (4 wheels)       Precautions / Restrictions Precautions Precautions: Fall Precaution Comments: monitor sats Restrictions Weight Bearing Restrictions: No     Mobility  Bed Mobility Overal bed mobility: Independent             General bed mobility comments: assisted with pulling pt up in bed and for propping feet on pillow    Transfers Overall transfer level: Needs assistance Equipment used: Rollator (4 wheels) Transfers: Sit to/from Stand Sit to Stand: Modified independent (Device/Increase time)           General transfer comment: good recall of need to engage breaks before standing, also good technique  for coming to standing and turning after seated rest break    Ambulation/Gait Ambulation/Gait assistance: Supervision Gait Distance (Feet): 125 Feet Assistive device: Rollator (4 wheels) Gait Pattern/deviations: Step-through pattern, Antalgic Gait velocity: reduced Gait velocity interpretation: <1.8 ft/sec, indicate of risk for recurrent falls   General Gait Details: slowed step-through gait as one leg is shorter than the other and pt states this is premorbid. Pt was able to use rollator and feels more confident with this.         Balance Overall balance assessment: Needs assistance Sitting-balance support: No upper extremity supported, Feet supported Sitting balance-Leahy Scale: Good     Standing balance support: During functional activity, Bilateral upper extremity supported Standing balance-Leahy Scale: Poor Standing balance comment: relies on at least 1 UE support                            Cognition Arousal: Alert Behavior During Therapy: WFL for tasks assessed/performed Overall Cognitive Status: Within Functional Limits for tasks assessed                                             General Comments General comments (skin integrity, edema, etc.): VSS on RA max noted HR with ambulation 69bpm      Pertinent Vitals/Pain Pain Assessment Pain Assessment: No/denies pain  PT Goals (current goals can now be found in the care plan section) Acute Rehab PT Goals Patient Stated Goal: to improve activity tolerance PT Goal Formulation: With patient Time For Goal Achievement: 12/21/22 Potential to Achieve Goals: Good Progress towards PT goals: Progressing toward goals    Frequency    Min 1X/week       AM-PAC PT "6 Clicks" Mobility   Outcome Measure  Help needed turning from your back to your side while in a flat bed without using bedrails?: None Help needed moving from lying on your back to sitting on the side of a flat bed  without using bedrails?: None Help needed moving to and from a bed to a chair (including a wheelchair)?: A Little Help needed standing up from a chair using your arms (e.g., wheelchair or bedside chair)?: A Little Help needed to walk in hospital room?: A Little Help needed climbing 3-5 steps with a railing? : A Little 6 Click Score: 20    End of Session Equipment Utilized During Treatment: Gait belt Activity Tolerance: Patient limited by fatigue Patient left: in bed;with call bell/phone within reach Nurse Communication: Mobility status PT Visit Diagnosis: Other abnormalities of gait and mobility (R26.89)     Time: 8315-1761 PT Time Calculation (min) (ACUTE ONLY): 25 min  Charges:    $Gait Training: 8-22 mins $Therapeutic Activity: 8-22 mins PT General Charges $$ ACUTE PT VISIT: 1 Visit                     Latrica Clowers B. Beverely Risen PT, DPT Acute Rehabilitation Services Please use secure chat or  Call Office [phone]    Elon Alas [facility] 12/14/2022, 4:59 PM

## 2022-12-14 NOTE — Progress Notes (Addendum)
HD#7 SUBJECTIVE:  Patient Summary: Amanda [name] is a 82 y.o. with a pertinent PMH of A-fib status post multiple cardioversion,  HFpEF,CVA, CAD status post STEMI, not on any anticoagulation, who presented with shortness of breath and fatigue and admitted for atrial fibrillation with RVR s/p DCCV 12/08/2022 AND 12/14/2022, on amiodarone 400 mg BID.  Overnight Events: No events overnight. Patient was made NPO in anticipation for a DCCV   Interim History: Patient seen at bedside after Cardioversion, back to sinus rhythm.Patient says she " feels better "and is asymptomatic at this time.  She denies any palpitation, chest pain or shortness of breath   OBJECTIVE:  Vital Signs: Vitals:   12/13/22 1526 12/13/22 1956 12/13/22 2349 12/14/22 0315  BP:  (!) 156/76 (!) 156/76 (!) 149/85  Pulse: (!) 101 78  94  Resp: 18 16  16   Temp: 97.8 F (36.6 C) 98.8 F (37.1 C)  (!) 97.5 F (36.4 C)  TempSrc: Oral Oral  Oral  SpO2:  94%  94%  Weight:    80.2 kg  Height:       Supplemental O2: Room Air  Filed Weights   12/12/22 0338 12/13/22 0251 12/14/22 0315  Weight: 81.4 kg 81.1 kg 80.2 kg     Intake/Output Summary (Last 24 hours) at 12/14/2022 0506 Last data filed at 12/14/2022 0322 Gross per 24 hour  Intake 1437 ml  Output 1950 ml  Net -513 ml   Net IO Since Admission: -2,841.67 mL [12/14/22 0506]  Physical Exam:    Physical Exam Constitutional:      General: She is not in acute distress.    Appearance: She is not ill-appearing, toxic-appearing or diaphoretic.     Comments: Laying in bed ,not in any acute distress  Pulmonary:     Effort: Pulmonary effort is normal. No respiratory distress.     Breath sounds: No stridor. No wheezing.     Comments: Crackles improved  Abdominal:     Palpations: Abdomen is soft. There is no mass.     Tenderness: There is no abdominal tenderness. There is no guarding or rebound.  Musculoskeletal:     Comments: Bilateral lower extremities edema  significantly improved   Neurological:     Mental Status: She is alert.  Psychiatric:        Mood and Affect: Mood normal.        Behavior: Behavior normal.        Thought Content: Thought content normal.     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 Her procedure went well and no apparent complications noted. AF Ablation rescheduled for 11/20 by Dr Elberta Fortis, Andree Coss, MD . -  Appreciate cardiology recs  - Continue amiodarone 400 mg BID  - Continue monitoring telemetry   - Continue Eliquis   # Acute on chronic heart failure with preserved ejection fraction exacerbation  #CAD status post STEMI with RCA DES placement 717 in and 1600 out = - 883. Restarted on Torsemide 10 mg  BID lower extremities .LE edema significantly improved. - Continue Torsemide 10 mg BID - Daily weights  - Strict in/out  - Amlodipine 5 mg  - Imdur 60 mg  - Hydralazine 50 mg   #Mitral regurgitation  #Severe tricuspid regurgitation Chronically moderate.Cardiology will repeat ECHO post ablation.  #Hypercalcemia   Patient is hypercalcemic,which seems to be chronic. No M-Spike noted on protein electrophoresis. PTH is elevated at 84 which is concerning for primary hyperparathyroidism.  Wondering if  this patient could benefit from Cinacalcet but  she is asymptomatic at this time.Patient does take Prolia shot every 6 months .Will continue to monitor .  # Chronic conditions . Type 2 diabetes        - Continue on SSI, Blood glucose is been at goal so far,daily CGM .  Hypertension        -Continue on home amlodipine 5 mg .  Hyperlipidemia        -Continue pravastatin 40 mg and Evocolcumab 140 mg injection  .  GERD        - Continue Protonix 40 mg daily and famotidine 40 mg daily .  Gout          -  Discontinue Febuxostat given history of CAD and risk for sudden cardiac death .  CKD stage IV         -Trend creatinine, avoid nephrotoxic agents .  Constipation          - Continue Colace 100 mg as needed .  Osteoporosis          - Patient takes denosumab 60 mg injection every 6 months, no changes needed at this time .  Anemia of chronic disease versus iron deficiency anemia        - Trend CBC,continue iron sulphate 325 mg daily with breakfast   Best Practice: Diet: Cardiac diet VTE: Place TED hose Start: 12/11/22 0944 apixaban (ELIQUIS) tablet 2.5 mg Start: 12/06/22 2200 Code: Full DISPO: Anticipated discharge in 1 days to Home pending  Medical work up .  Signature: Kathleen Lime , MD Internal Medicine Resident, PGY-1 [facility]  Pager: [phone] 5:06 AM, 12/14/2022   Please contact the on call pager after 5 pm and on weekends at [phone].

## 2022-12-14 NOTE — Transfer of Care (Signed)
Immediate Anesthesia Transfer of Care Note  Patient: Amanda [name]  Procedure(s) Performed: CARDIOVERSION (CATH LAB)  Patient Location: PACU and Cath Lab  Anesthesia Type:General  Level of Consciousness: drowsy, patient cooperative, and responds to stimulation  Airway & Oxygen Therapy: Patient Spontanous Breathing and Patient connected to face mask oxygen  Post-op Assessment: Report given to RN and Post -op Vital signs reviewed and stable  Post vital signs: Reviewed and stable  Last Vitals:  Vitals Value Taken Time  BP    Temp    Pulse    Resp    SpO2      Last Pain:  Vitals:   12/14/22 0947  TempSrc: Temporal  PainSc: 0-No pain      Patients Stated Pain Goal: 0 (12/11/22 0840)  Complications: No notable events documented.

## 2022-12-14 NOTE — Progress Notes (Signed)
   Patient Name: Amanda [name] Date of Encounter: 12/14/2022 [facility] Cardiologist: Gypsy Balsam, MD   Interval Summary  .    No major events of night.  Down to the Cath Lab.  Reported ablation with rate control  Vital Signs .    Vitals:   12/14/22 0955 12/14/22 1000 12/14/22 1005 12/14/22 1010  BP: (!) 160/69 (!) 140/69  130/75  Pulse: (!) 103 83 95 88  Resp: 18 13 (!) 9 15  Temp:      TempSrc:      SpO2: (!) 85% 95% 96% 97%  Weight:      Height:        Intake/Output Summary (Last 24 hours) at 12/14/2022 1015 Last data filed at 12/14/2022 0322 Gross per 24 hour  Intake 717 ml  Output 1600 ml  Net -883 ml      12/14/2022    3:15 AM 12/13/2022    2:51 AM 12/12/2022    3:38 AM  Last 3 Weights  Weight (lbs) 176 lb 14.4 oz 178 lb 14.4 oz 179 lb 8 oz  Weight (kg) 80.241 kg 81.149 kg 81.421 kg      Telemetry/ECG    Atrial fibrillation ventricular rates 80-100 beats per- Personally Reviewed  Physical Exam .   GEN: No acute distress.   Neck: No JVD Cardiac: irregular , no murmurs, rubs, or gallops.  Respiratory: Clear to auscultation bilaterally. GI: Soft, nontender, non-distended  MS: No edema  Assessment & Plan .     82 year old woman presenting with acute exacerbation of chronic diastolic heart failure in the setting of persistent atrial fibrillation, early recurrence of arrhythmia following cardioversion.  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  In the setting of severe hypervolemia she has moderate-severe MR and severe TR.  Has chronic kidney disease stage IV with a baseline creatinine around 2.0-2.2 (GFR 20-25).  Has OSA, on CPAP.   Remains in atrial fibrillation: Has been loading on amiodarone and attempt at electrical cardioversion this morning.  Creatinine has returned to her baseline.  Appears to be clinically  euvolemic.  Long-term plan is A-fib ablation in December.   For questions or updates, please contact [facility] Please consult [email or web address] for contact info under        Signed, Thurmon Fair, MD

## 2022-12-14 NOTE — CV Procedure (Signed)
Procedure:   DCCV  Indication:  Symptomatic atrial fibrillation  Procedure Note:  The patient signed informed consent.  They have had had therapeutic anticoagulation with apixaban greater than 3 weeks.  Anesthesia was administered by Dr. Nance Pew.  Patient received 40 mg IV lidocaine and 60 mg IV propofol.Adequate airway was maintained throughout and vital followed per protocol.  They were cardioverted x 1 with 200J of biphasic synchronized energy.  They converted initially to a paced, v paced and then they were a sensed, v sensed.  There were no apparent complications.  The patient had normal neuro status and respiratory status post procedure with vitals stable as recorded elsewhere.    Follow up:  They will continue on current medical therapy and follow up with cardiology as scheduled.  Jodelle Red, MD PhD 12/14/2022 10:27 AM

## 2022-12-14 NOTE — Anesthesia Preprocedure Evaluation (Addendum)
Anesthesia Evaluation  Patient identified by MRN, date of birth, ID band Patient awake    Reviewed: Allergy & Precautions, NPO status , Patient's Chart, lab work & pertinent test results  Airway Mallampati: II  TM Distance: >3 FB Neck ROM: Full    Dental no notable dental hx.    Pulmonary sleep apnea , former smoker   Pulmonary exam normal        Cardiovascular hypertension, + CAD, + Past MI and +CHF  + dysrhythmias + pacemaker  Rhythm:Regular Rate:Normal     Neuro/Psych CVA  negative psych ROS   GI/Hepatic   Endo/Other  diabetes    Renal/GU      Musculoskeletal   Abdominal Normal abdominal exam  (+)   Peds  Hematology   Anesthesia Other Findings   Reproductive/Obstetrics                             Anesthesia Physical Anesthesia Plan  ASA: 3  Anesthesia Plan: General   Post-op Pain Management:    Induction: Intravenous  PONV Risk Score and Plan: 3 and Ondansetron, Dexamethasone and Treatment may vary due to age or medical condition  Airway Management Planned: Mask  Additional Equipment: None  Intra-op Plan:   Post-operative Plan:   Informed Consent: I have reviewed the patients History and Physical, chart, labs and discussed the procedure including the risks, benefits and alternatives for the proposed anesthesia with the patient or authorized representative who has indicated his/her understanding and acceptance.     Dental advisory given  Plan Discussed with: CRNA  Anesthesia Plan Comments:        Anesthesia Quick Evaluation

## 2022-12-14 NOTE — Telephone Encounter (Signed)
Per Dr. Elberta Fortis request I called pt and offered to move her Afib Ablation up from December 16 to November 20. She ask me to call her Daughter to confirm with her that this date was ok.  I called her Daughter Amanda [name]) and she confirmed that 11/20 is ok.  I went over detailed instructions with her Daughter and sent letter to pt's MyChart.

## 2022-12-14 NOTE — Interval H&P Note (Signed)
History and Physical Interval Note:  12/14/2022 9:47 AM  Amanda [name]  has presented today for surgery, with the diagnosis of afib.  The various methods of treatment have been discussed with the patient and family. After consideration of risks, benefits and other options for treatment, the patient has consented to  Procedure(s): CARDIOVERSION (CATH LAB) (N/A) as a surgical intervention.  The patient's history has been reviewed, patient examined, no change in status, stable for surgery.  I have reviewed the patient's chart and labs.  Questions were answered to the patient's satisfaction.     Prince Couey Cristal Deer

## 2022-12-15 ENCOUNTER — Other Ambulatory Visit (HOSPITAL_COMMUNITY): Payer: Self-pay

## 2022-12-15 ENCOUNTER — Encounter (HOSPITAL_COMMUNITY): Payer: Self-pay | Admitting: Cardiology

## 2022-12-15 DIAGNOSIS — I498 Other specified cardiac arrhythmias: Secondary | ICD-10-CM

## 2022-12-15 LAB — GLUCOSE, CAPILLARY
Glucose-Capillary: 141 mg/dL — ABNORMAL HIGH (ref 70–99)
Glucose-Capillary: 280 mg/dL — ABNORMAL HIGH (ref 70–99)

## 2022-12-15 LAB — RENAL FUNCTION PANEL
Albumin: 2.5 g/dL — ABNORMAL LOW (ref 3.5–5.0)
Anion gap: 10 (ref 5–15)
BUN: 48 mg/dL — ABNORMAL HIGH (ref 8–23)
CO2: 27 mmol/L (ref 22–32)
Calcium: 11.3 mg/dL — ABNORMAL HIGH (ref 8.9–10.3)
Chloride: 103 mmol/L (ref 98–111)
Creatinine, Ser: 2.48 mg/dL — ABNORMAL HIGH (ref 0.44–1.00)
GFR, Estimated: 19 mL/min — ABNORMAL LOW (ref >60)
Glucose, Bld: 127 mg/dL — ABNORMAL HIGH (ref 70–99)
Phosphorus: 3.3 mg/dL (ref 2.5–4.6)
Potassium: 4.2 mmol/L (ref 3.5–5.1)
Sodium: 140 mmol/L (ref 135–145)

## 2022-12-15 LAB — CBC
HCT: 27.9 % — ABNORMAL LOW (ref 36.0–46.0)
Hemoglobin: 8.4 g/dL — ABNORMAL LOW (ref 12.0–15.0)
MCH: 27.1 pg (ref 26.0–34.0)
MCHC: 30.1 g/dL (ref 30.0–36.0)
MCV: 90 fL (ref 80.0–100.0)
Platelets: 399 10*3/uL (ref 150–400)
RBC: 3.1 MIL/uL — ABNORMAL LOW (ref 3.87–5.11)
RDW: 16.8 % — ABNORMAL HIGH (ref 11.5–15.5)
WBC: 7.6 10*3/uL (ref 4.0–10.5)
nRBC: 0 % (ref 0.0–0.2)

## 2022-12-15 MED ORDER — AMIODARONE HCL 200 MG PO TABS
400.0000 mg | ORAL_TABLET | Freq: Two times a day (BID) | ORAL | 0 refills | Status: DC
  Filled 2022-12-15: qty 36, 9d supply, fill #0

## 2022-12-15 MED ORDER — APIXABAN 2.5 MG PO TABS
2.5000 mg | ORAL_TABLET | Freq: Two times a day (BID) | ORAL | 0 refills | Status: DC
  Filled 2022-12-15 (×2): qty 60, 30d supply, fill #0

## 2022-12-15 MED ORDER — FAMOTIDINE 10 MG PO TABS
10.0000 mg | ORAL_TABLET | Freq: Every day | ORAL | 0 refills | Status: DC
  Filled 2022-12-15: qty 30, 30d supply, fill #0

## 2022-12-15 NOTE — TOC Progression Note (Signed)
Transition of Care (TOC) - Progression Note   [name] with [facility] aware of discharge today  Patient Details  Name: Amanda [name] MRN: [id number] Date of Birth: 1940/12/12  Transition of Care [facility]) CM/SW Contact  Deidre Carino, Adria Devon, RN Phone Number: 12/15/2022, 3:10 PM  Clinical Narrative:       Expected Discharge Plan: Home w Home Health Services Barriers to Discharge: Continued Medical Work up  Expected Discharge Plan and Services   Discharge Planning Services: CM Consult Post Acute Care Choice: Home Health, Durable Medical Equipment Living arrangements for the past 2 months: Single Family Home Expected Discharge Date: 12/15/22               DME Arranged: Dan Humphreys rolling with seat DME Agency: [employer] Date DME Agency Contacted: 12/09/22 Time DME Agency Contacted: 1459 Representative spoke with at DME Agency: Vaughan Basta HH Arranged: PT HH Agency: [facility] Date [facility] Agency Contacted: 12/09/22 Time HH Agency Contacted: 1500 Representative spoke with at [facility] Agency: [name]   Social Determinants of Health (SDOH) Interventions SDOH Screenings   Food Insecurity: No Food Insecurity (12/07/2022)  Housing: Low Risk  (12/07/2022)  Transportation Needs: No Transportation Needs (12/07/2022)  Utilities: Not At Risk (12/07/2022)  Tobacco Use: Medium Risk (12/14/2022)    Readmission Risk Interventions     No data to display

## 2022-12-15 NOTE — Discharge Instructions (Addendum)
Dear Ms Amanda [name], It was a pleasure taking care of you at [facility]. You were admitted for shortness of breath and weakness and found to be in Afib with RVR for which you were cardioverted 2 times. We are discharging you home now that you are doing better. Please follow the following instructions.   1) You have a history of Afib that brought you to the hospital.You were cardioverted and given  Amiodarone 400 mg twice daily to stabilize your heart rhythm. You were also started on a  small dose of Eliquis to prevent blood clots. You have been scheduled for an ablation on NOVEMBER 20th 2024. It is very important that you attend your scheduled appointment.You will continue taking Amiodarone 400 mg 2 times daily until your ablation on November 20th. We are also sending you home on Eliquis 2.5 mg daily. Please continuing taking this until your ablation .  2) You have a history of heart failure.During this hospitalization, we gave you some Laxis to help you get rid of all the fluid that your body was holding on to.Your total body fluid has improved significantly. To keep your heart functioning better and to prevent your body from retaining too much fluid, PLEASE take these medications at home until  you go in for your ablation or see your primary care doctor. - Torsemide 10 mg twice daily - Imdur 60 mg Daily - Hydralazine 50 mg daily   3) Please continue to take all your home medications as prescribed. No other changes is required until you see your primary care doctor.   4) Please call your doctor or go to the nearest ED when you have any of these symptoms  - Fast, irregular heartbeat   -Heart palpitations (rapid "flopping" or "fluttering" feeling in the chest)  - Feeling lightheaded or faint  - Chest pain or pressure  -Shortness of breath, especially when lying down  -Tiring more easily   Please follow up with your primary care doctor for medication changes and follow up on your chronic  conditions.

## 2022-12-15 NOTE — Discharge Summary (Signed)
Name: Amanda [name] MRN: [id number] DOB: Jul 26, 1940 82 y.o. PCP: Street, Stephanie Coup, MD  Date of Admission: 12/06/2022  5:01 PM Date of Discharge: 12/15/2022 2:04 PM Attending Physician: Dr. 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)   (HFpEF) heart failure with preserved ejection fraction [facility])    Discharge Medications: Allergies as of 12/15/2022       Reactions   Ciprofloxacin Other (See Comments)   Achilles tendon pain   Contrast Media [iodinated Contrast Media]    Avoid due to stage 4 kidney disease    Crestor [rosuvastatin]    Joint pain   Lipitor [atorvastatin]    Joint pain   Nsaids    Avoid due to stage 4 kidney disease    Statins Other (See Comments)   Joint pain, tolerates pravastatin         Medication List     STOP taking these medications    febuxostat 40 MG tablet Commonly known as: ULORIC       TAKE these medications    amiodarone 200 MG tablet Commonly known as: PACERONE Take 2 tablets (400 mg total) by mouth 2 (two) times daily for 9 days. What changed:  how much to take when to take this   amLODipine 5 MG tablet Commonly known as: NORVASC TAKE 1 TABLET BY MOUTH DAILY   apixaban 2.5 MG Tabs tablet Commonly known as: ELIQUIS Take 1 tablet (2.5 mg total) by mouth 2 (two) times daily.   BENEFIBER PO Take 3 tablets by mouth daily.   Biotin 1000 MCG tablet Take 1,000 mcg by mouth daily.   blood glucose meter kit and supplies Kit Dispense based on patient and insurance preference. Use up to four times daily as directed. What changed:  how much to take how to take this when to take this   Coenzyme Q10 100 MG capsule Take 100 mg by mouth 2 (two) times daily.   cyanocobalamin 1000 MCG tablet Commonly known as: VITAMIN B12 Take 1,000 mcg by mouth in the morning and at bedtime.   denosumab  60 MG/ML Sosy injection Commonly known as: PROLIA Inject 60 mg into the skin every 6 (six) months.   estradiol 0.1 MG/GM vaginal cream Commonly known as: ESTRACE Place 1 Applicatorful vaginally every Monday, Wednesday, and Friday.   famotidine 40 MG tablet Commonly known as: PEPCID Take 40 mg by mouth every evening. What changed: Another medication with the same name was added. Make sure you understand how and when to take each.   famotidine 10 MG tablet Commonly known as: PEPCID Take 1 tablet (10 mg total) by mouth daily. Start taking on: December 16, 2022 What changed: You were already taking a medication with the same name, and this prescription was added. Make sure you understand how and when to take each.   feeding supplement (GLUCERNA SHAKE) Liqd Take 237 mLs by mouth 3 (three) times daily between meals. What changed: when to take this   ferrous sulfate 325 (65 FE) MG EC tablet Take 325 mg by mouth 2 (two) times daily with breakfast and lunch.   Florajen Acidophilus Caps Take 1 capsule by mouth daily at 12 noon. 390 mg each   folic acid 800 MCG tablet Commonly known as: FOLVITE Take 800 mcg by mouth daily at 12 noon.   hydrALAZINE 50 MG tablet Commonly known as: APRESOLINE Take 1  tablet (50 mg total) by mouth every 6 (six) hours.   isosorbide mononitrate 60 MG 24 hr tablet Commonly known as: IMDUR Take 60 mg by mouth every morning.   levothyroxine 50 MCG tablet Commonly known as: SYNTHROID Take 50 mcg by mouth daily before breakfast.   nitroGLYCERIN 0.4 MG SL tablet Commonly known as: NITROSTAT Place 0.4 mg under the tongue every 5 (five) minutes as needed for chest pain.   omega-3 acid ethyl esters 1 g capsule Commonly known as: LOVAZA Take 2 capsules (2 g total) by mouth daily.   OSTEO BI-FLEX REGULAR STRENGTH PO Take 25 mcg by mouth daily at 12 noon. Plus D3   pantoprazole 40 MG tablet Commonly known as: PROTONIX Take 1 tablet (40 mg total) by mouth  daily.   pravastatin 40 MG tablet Commonly known as: PRAVACHOL Take 40 mg by mouth at bedtime.   PreserVision AREDS 2 Caps Take 1 capsule by mouth 2 (two) times daily.   ranolazine 500 MG 12 hr tablet Commonly known as: RANEXA Take 1 tablet (500 mg total) by mouth 2 (two) times daily.   Refresh Relieva 0.5-0.9 % ophthalmic solution Generic drug: carboxymethylcellul-glycerin Place 1 drop into both eyes 4 (four) times daily as needed for dry eyes. VE   Repatha SureClick 140 MG/ML Soaj Generic drug: Evolocumab Inject 140 mg into the skin every 14 (fourteen) days.   torsemide 10 MG tablet Commonly known as: DEMADEX Take 1 tablet (10 mg total) by mouth 2 (two) times daily.   Vitamin D3 50 MCG (2000 UT) Tabs Take 2,000 Units by mouth daily at 12 noon.               Durable Medical Equipment  (From admission, onward)           Start     Ordered   12/09/22 1341  For home use only DME 4 wheeled rolling walker with seat  Once       Question:  Patient needs a walker to treat with the following condition  Answer:  Dependent on walker for ambulation   12/09/22 1341            Disposition and follow-up:   Amanda [name] was discharged from [facility] in Stable condition.  At the hospital follow up visit please address:  1.  Follow-up:  Atrial fribrillation, now NSR S/p 2x cardioversions during 11/3 hospitalization OSA, not on CPAP - Improvement in symptoms at discharge - Now on anticoagulation with Eliquis 2.5 mg BID - Has a follow up with Dr. Bing Matter 12/18/2022 and is scheduled for A-fib ablation 12/23/2022.  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) Moderate-severe mitral regurgitation Severe tricuspid regurgitation Syncope w/  intermittent heart block s/p dual-chamber pacemaker (2021) - S/p IV diuresis and improvement if volume status -  Discharged on torsemide 10 mg twice daily, Imdur 60 mg, hydralazine 50 mg -  Pending repeat 2D echocardiogram  after Ablation  for MR /TR  - Monitor RFP, magnesium at follow up  CKD4 -Elevated Cr during this admission -Ensure follow up with Nephrology at follow up  Hypercalcemia Osteoporosis -Hypercalcemia likely due secondary/tertiary hyperparathyroidism of renal disease -Monitor Ca at follow up  - On Prolia twice yearly  - Monitor Ca at follow up - Will  likely benefit from therapeutic regimen  Gout - Febuxostat stopped during this admission given risk of heart disease -Consider Allopurinol at follow up  History of GI bleeding -Restarted on anticoagulation; Dr. Chales Abrahams notified -Hgb stable at discharge -Monitor Hgb at discharge  T2DM A1c 7.7  -Monitor cBGs as this patient may benefit from GLP-1 agonist vs insulin use  Abnormal Ft4 -Follow up TSH and FT4 in the outpatient setting  2.  Labs / imaging /Test needed at time of follow-up: BMET , hepatic function, CBC, cBG, TSH, FT4  3.  Pending labs/ test needing follow-up: N/A  4.  Medication Changes  ADDED  - Eliquis 2.5 mg Daily    MODIFIED  - Amiodarone dose increased from 200mg  BID to 400 mg BID   STOPPED - Febuxostat  Follow-up Appointments:  Follow-up Information     [facility], [facility] Follow up.   Specialty: Home Health Services Why: for home health services Contact information: [address] [place] [address] [phone]                 Hospital Course by problem list:  Hx of persistent atrial fibrillation with RVR, now NSR History of cardioversion x 4, watchman procedure History of CVA (2018) Amanda [name] is a 82 y.o. female with a history of persistent atrial fibrillation s/p DCCV x4, Watchman placement 2018, not on AC on admission due to prior GIB  (pending endoscopy 01/04/23), syncope due to intermittent heart block s/p BiV PPM 2021, HTN, HLD, ischemic CVA 2018, CAD s/p MI with RCA DES, moderate to severe MR, severe TR, CKD III/IV, chronic anemia, GERD & ischemic colitis admitted on 12/07/22 with shortness of breath,found to have AFwRVR, got DCCV on 12/08/2022 and placed on Amiodarone 400 mg BID. Patient converted back to Afib on 11/8 thought to be secondary to fluid overload s/p IV diuresis without improvement. On 11/11, patient underwent a second cardioversion during this hospitalization with success, remaining in sinus rhythm through  the rest of this admission. Patient was started on Amiodarone load with 400 mg BID until 11/20, will follow up with Dr. Bing Matter 12/18/2022, and will undergo ablation on 12/23/2022. She was discharged with 30-day supply of Eliquis. Of note, recommend sleep/titration study for cPAP in this patient.   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 Cardiology was consulted who recommended IV Lasix BID. On 11/10,she was discontinued on IV lasix and switched to Torsemide 10 mg TID on 12/13/2022. Patient was discharged with oxygen saturations above 92% on RA and improve on pulmonary auscultation on examination. Reassess volume status at follow up and need for further diuresis.  CKD stage IV Initially with Cr 2.17 on admission, around her baseline. However, given need for IV diuresis, patient's Cr with elevations up to 2.48. She was eventually transitioned to PO torsemide with stable Cr. Will need close follow up with nephrology and RFP at follow up.  Hypercalcemia  Patient was found to be  hypercalcemic during this admission. SPEP, UPEP/UIFE/Light Chains unremarkable during this admission.  PTH elevated. 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 Recommend close follow up by [facility] nephrologist for mineral bone disease management and further monitoring   Anemia of chronic diease  Hx of GI bleed Stable during this admission. Iron panel 3 months ago showed iron 41, transferrin 195, Sat 15, ferritin 216, TIBC 273. Discussed with Dr. Chales Abrahams reinitiation of Eliquis. Hgb 8.4 at discharge.  T2Diabetes: A1c 7.7 during this admission. Minimal use of insulin during this admission HTN- continued on home medications Gout- Discontinued Febuxostat 40 mg daily given risk of heart disease; held Colchicine 0.6 mg daily PRN  CAD- Held Nitroglycerin 0.4 mg PRN, Ranolazine 500 mg BID  Constipation- Colace 100 mg PRN HLD- Continue Pravastatin 40 mg daily. Evolocumab 140 mg injection q14d.  Hypothyroidism- TSH normal. Last TSH 5.33 a few months ago. FT4 elevated. Continued home Levothyroxine 50 mcg daily.  Will need follow up of TFT in the outpatient setting  Discharge Subjective: Patient is seen at bedside. She was laying in bed in no acute distress. Reports mild itching on her chest around where the cardioversion pads were placed but is otherwise doing well and has no other complains. She is agreeable to the plans for discharge and following up for her upcoming ablation on November 20th.  Discharge Exam:   Blood pressure (!) 144/45, pulse 65, temperature 97.9 F (36.6 C), temperature source Oral, resp. rate 16, height 5\' 3"  (1.6 m), weight 79.5 kg, SpO2 91%.  Constitutional:well-appearing woman ,laying in bed , in no acute distress HENT: normocephalic atraumatic, mucous membranes moist Cardiovascular: regular rate and rhythm, no m/r/g Pulmonary/Chest: normal work of breathing on room air, lungs clear to auscultation bilaterally. Mild crackles   Abdominal: soft, non-tender, non-distended. Neurological: alert & oriented x 3 MSK: no gross abnormalities. Trace pitting edema Skin: warm and dry Psych: Normal mood and affect  Pertinent Labs, Studies, and Procedures:     Latest Ref Rng & Units 12/15/2022    8:57 AM 12/14/2022    5:56 AM 12/13/2022    4:18 AM  CBC  WBC 4.0 - 10.5 K/uL 7.6  7.0  6.8   Hemoglobin 12.0 - 15.0 g/dL 8.4  7.7  8.3   Hematocrit 36.0 - 46.0 % 27.9  25.0  27.8   Platelets 150 - 400 K/uL 399  348  422        Latest Ref Rng & Units 12/15/2022    8:57 AM 12/14/2022    5:56 AM 12/13/2022    4:18 AM  CMP  Glucose 70 - 99 mg/dL 478  295  621   BUN 8 - 23 mg/dL 48  48  53   Creatinine 0.44 - 1.00 mg/dL 3.08  6.57  8.46   Sodium 135 - 145 mmol/L 140  138  140   Potassium 3.5 - 5.1 mmol/L 4.2  4.2  4.4   Chloride 98 - 111 mmol/L 103  104  105   CO2 22 - 32 mmol/L 27  22  25    Calcium 8.9 - 10.3 mg/dL 96.2  95.2  84.1     CT Head Wo Contrast  Result Date: 12/06/2022 CLINICAL DATA:  The lower in. EXAM: CT HEAD WITHOUT CONTRAST TECHNIQUE: Contiguous axial images were obtained from the base of the skull through the vertex without intravenous contrast. RADIATION DOSE REDUCTION: This exam was performed according to the departmental dose-optimization program which includes automated exposure control, adjustment of the mA and/or kV according to patient size and/or use of iterative reconstruction technique. COMPARISON:  Head CT dated 05/03/2020. FINDINGS: Brain: Mild age-related atrophy and chronic microvascular ischemic changes. 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 There is no acute intracranial hemorrhage. No mass  effect or midline shift. No extra-axial fluid collection. Vascular: No hyperdense vessel or unexpected calcification. Skull: Normal. Negative for fracture or focal lesion. Sinuses/Orbits: No acute finding. Other: None IMPRESSION: 1. No acute intracranial pathology. 2. Mild age-related atrophy and  chronic microvascular ischemic changes. 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 Electronically Signed   By: Elgie Collard M.D.   On: 12/06/2022 17:54   DG Chest Port 1 View  Result Date: 12/06/2022 CLINICAL DATA:  Sepsis EXAM: PORTABLE CHEST 1 VIEW COMPARISON:  X-ray 03/30/2021 FINDINGS: Enlarged cardiopericardial silhouette. Calcified aorta. Increasing interstitial changes the lungs. Favor edema with vascular congestion rather than infiltrate. Recommend follow-up. Small pleural effusions. No pneumothorax. Left upper chest battery pack with leads along the right side of the heart for pacemaker. Overlapping cardiac leads. Atrial occlusion device. IMPRESSION: Pacemaker.  Enlarged heart. Developing interstitial changes seen of the lungs, favor edema. Small effusions. Recommend follow-up. Electronically Signed   By: Karen Kays M.D.   On: 12/06/2022 17:37     Discharge Instructions: ear Ms Montijo, It was a pleasure taking care of you at [facility]. You were admitted for shortness of breath and weakness and found to be in Afib with RVR for which you were cardioverted 2 times. We are discharging you home now that you are doing better. Please follow the following instructions.   1) You have a history of Afib that brought you to the hospital.You were cardioverted  twice until the rhythm in your heart normalized. We INCREASED your AMIODARONE to  400 MG every 12 hours until you go for your ablation. You were also re-started on a  small dose of Eliquis to prevent blood clots. You have been scheduled for an ablation on NOVEMBER 20th 2024. It is very important that you attend your scheduled appointment.You will continue taking Amiodarone 400 mg 2 times daily until your ablation on November 20th. We are also sending you home on Eliquis 2.5 mg daily. Please continuing taking this until your ablation .  2) You have a history of heart failure.During this hospitalization, we gave you some Lasix to  help decrease the fluids in your lungs and in your legs. The excess body fluid has improved significantly. To keep your heart functioning better and to prevent your body from retaining too much fluid, PLEASE take these medications at home until  you follow up with your heart doctor or primary care doctor. - Torsemide 10 mg every 12 hours every day - Imdur 60 mg Daily - Hydralazine 50 mg daily   3) Please continue to take all your home medications as prescribed. No other changes is required until you see your primary care doctor.   4) Please call your doctor or go to the nearest ED when you have any of these symptoms  - Fast, irregular heartbeat   -Heart palpitations (rapid "flopping" or "fluttering" feeling in the chest)  - Feeling lightheaded or faint  - Chest pain or pressure  -Shortness of breath, especially when lying down  -Tiring more easily   5) We have stopped your Febuxostat as it can increase risk of heart disease. Please discuss with your primary care provider to alternatives like Allopurinol.  6) Please follow up with your nephrologist as you will need careful monitoring of your kidney function and electrolytes in your blood  Appointments:  Cardiology: Dr. Bing Matter 12/18/2022 - you will need repeat blood work at this appointment Atrial fibrillation ablation: 12/23/2022.    Please follow up with your primary care  doctor for medication changes and follow up on your chronic conditions.  Take care,  Dr. Kathleen Lime, MD   Dr. [name], MD  Signed: Kathleen Lime, MD PGY1 [name], MD PGY2 Pager: [phone] 12/15/2022, 2:04 PM

## 2022-12-15 NOTE — Care Management Important Message (Signed)
Important Message  Patient Details  Name: Amanda [name] MRN: [id number] Date of Birth: 1940/04/12   Important Message Given:  Yes - Medicare IM     Sherilyn Banker 12/15/2022, 2:35 PM

## 2022-12-15 NOTE — Progress Notes (Addendum)
Patient Name: Amanda [name] Date of Encounter: 12/15/2022 [facility] Cardiologist: Gypsy Balsam, MD   Interval Summary  .    Cardioverted successfully yesterday.  Maintaining normal sinus rhythm.  Has a cough today.  Vital Signs .    Vitals:   12/14/22 1923 12/14/22 2358 12/15/22 0300 12/15/22 0623  BP: (!) 150/57 (!) 150/57 (!) 168/66 (!) 168/66  Pulse: 60  65   Resp: 16  16   Temp: 97.9 F (36.6 C)  97.9 F (36.6 C)   TempSrc: Oral  Oral   SpO2: 93%  91%   Weight:   79.5 kg   Height:        Intake/Output Summary (Last 24 hours) at 12/15/2022 0846 Last data filed at 12/15/2022 0800 Gross per 24 hour  Intake 360 ml  Output 700 ml  Net -340 ml      12/15/2022    3:00 AM 12/14/2022    3:15 AM 12/13/2022    2:51 AM  Last 3 Weights  Weight (lbs) 175 lb 4.3 oz 176 lb 14.4 oz 178 lb 14.4 oz  Weight (kg) 79.5 kg 80.241 kg 81.149 kg      Telemetry/ECG    Sinus rhythm heart rates in the 50s to 60s- Personally Reviewed  CV Studies    Echocardiogram 05/01/2022 1. Left ventricular ejection fraction, by estimation, is 70 to 75% . The left ventricle has hyperdynamic function. The left ventricle has no regional wall motion abnormalities. There is moderate left ventricular hypertrophy. Left ventricular diastolic parameters are consistent with Grade II diastolic dysfunction ( pseudonormalization) . The average left ventricular global longitudinal strain is 16. 4 % . The global longitudinal strain is abnormal. 2. Right ventricular systolic function is normal. The right ventricular size is normal. There is normal pulmonary artery systolic pressure. 3. Left atrial size was mildly dilated. 4. The mitral valve is normal in structure. Moderate mitral valve regurgitation. No evidence of mitral stenosis. Moderate mitral annular calcification. 5. The aortic valve is calcified. There is mild calcification of the aortic valve. There is mild thickening of the aortic valve.  Aortic valve regurgitation is not visualized. Mild aortic valve stenosis. Aortic valve mean gradient measures 8. 4 mmHg. 6. The inferior vena cava is normal in size with greater than 50% respiratory variability, suggesting right atrial pressure of 3 mmHg.  Physical Exam .   GEN: No acute distress.  Cough Neck: + L JVD Cardiac: RRR, no murmurs, rubs, or gallops.  Respiratory: crackles GI: Soft, nontender, non-distended  MS: No edema  Patient Profile    Amanda [name] is a 82 y.o. female has hx of persistent atrial fibrillation status post multiple cardioversions, watchman's device 2018, intermittent heart block status post PPM 2021, chronic HFpEF, CVA 2018, CAD status post RCA DES, moderate to severe TR, CKD, chronic anemia and admitted on 12/06/2022 for the evaluation of A-fib RVR.  Assessment & Plan .     Persistent A-fib RVR status post multiple cardioversions Watchman's device 2018 [employer] PPM for intermittent heart block 2021 Cardioverted yesterday on 12/14/2022 and maintaining normal sinus rhythm with heart rates in the 50s to 60s.  She has an AF ablation scheduled with Dr. Elberta Fortis. Will discuss with MD loading dose of amiodarone.  Continue 400 mg twice daily for now.  Continue Eliquis reduced dosing 2.5 mg twice daily for age and renal function  Acute on chronic HFpEF Still appears to be volume up with JVD and crackles, she also has a  cough.  She already got her morning dose of torsemide.  May consider giving spot dose of IV Lasix before DC, but now that she is back in rhythm she may auto diurese.  Will discuss with MD On torsemide 10 mg twice daily, Imdur 60 mg, hydralazine 50 mg.    CAD status post DES to RCA Stable, no chest pain No aspirin with Eliquis.  Continue pravastatin 40 mg, Imdur  Moderate to severe MR Continue to follow  CKD Stable, creatinine 2.48  OSA on CPAP  Anemia Chronically low.  7.7.  Appears to be around baseline.  Monitor for any signs of  bleeding and trend.  Already has a appointment with her primary cardiologist 12/18/2022.   For questions or updates, please contact [facility] Please consult [email or web address] for contact info under        Signed, Abagail Kitchens, PA-C    I have seen and examined the patient along with Abagail Kitchens, PA-C  .  I have reviewed the chart, notes and new data.  I agree with PA/NP's note.  Key new complaints: Feels better, walked in hallway with only mild shortness of breath.  Mostly feels weak and deconditioned from lying in bed for several days. Key examination changes: Mild ankle edema, 4-6 cm JVD, clear lungs, regular rate and rhythm Key new findings / data: Has maintained normal rhythm ever since her cardioversion  PLAN: Okay for discharge today. Has an appointment with Dr. Bing Matter 12/18/2022 and is scheduled for A-fib ablation 12/23/2022. Continue amiodarone 400 mg twice daily until her ablation procedure scheduled 12/23/2022, then adjust antiarrhythmics per EP Continue Eliquis 2.5 mg twice daily Continue torsemide 10 mg twice daily. Recheck BMET this Friday after office visit.   Thurmon Fair, MD, [facility] [phone] 12/15/2022, 12:27 PM

## 2022-12-15 NOTE — Progress Notes (Signed)
Physical Therapy Treatment Patient Details Name: Amanda [name] MRN: [id number] DOB: 01/25/1941 Today's Date: 12/15/2022   History of Present Illness 82 y.o. female presents to [facility] on 12/06/2022 with SOB in the setting of afib. s/p 11/11 cardioversion PMH includes afib, GIB, CVA, STEMI, severe TR, biventricular PPM, DMII, HTN, HL, GERD, ischemic colitis.    PT Comments  Pt and daughter in room getting ready for discharge on entry. Pt's daughter is concerned about pt safety at discharge, and she is afraid if a fall happens mother will not ask for help. Discussed Med Alert, or Apple watch (or similar) that would inform daughter. Pt agreeable to something like that. Pt requires cuing for applying breaks before getting up to Rollator and for turning with Rollator instead of trying to back up. Pt to receive [facility] when she gets home.     If plan is discharge home, recommend the following: Assistance with cooking/housework;Help with stairs or ramp for entrance   Can travel by private vehicle      Yes   Equipment Recommendations  Rollator (4 wheels)       Precautions / Restrictions Precautions Precautions: Fall Precaution Comments: monitor sats Restrictions Weight Bearing Restrictions: No     Mobility  Bed Mobility               General bed mobility comments: sitting EoB on entry    Transfers Overall transfer level: Needs assistance Equipment used: Rollator (4 wheels) Transfers: Sit to/from Stand Sit to Stand: Modified independent (Device/Increase time)           General transfer comment: had to remind pt to use brakes on Rollator before powering up    Ambulation/Gait Ambulation/Gait assistance: Supervision Gait Distance (Feet): 4 Feet Assistive device: Rollator (4 wheels) Gait Pattern/deviations: Step-through pattern, Antalgic Gait velocity: reduced Gait velocity interpretation: <1.31 ft/sec, indicative of household ambulator   General Gait Details:  pt ambulated 4 feet forward when RN walked into room for discharge. instead of turning around pt steps backwards to bed, recommended that pt turn around to walk back to bed         Balance Overall balance assessment: Needs assistance Sitting-balance support: No upper extremity supported, Feet supported Sitting balance-Leahy Scale: Good     Standing balance support: During functional activity, Bilateral upper extremity supported Standing balance-Leahy Scale: Poor Standing balance comment: relies on at least 1 UE support                            Cognition Arousal: Alert Behavior During Therapy: WFL for tasks assessed/performed Overall Cognitive Status: Within Functional Limits for tasks assessed                                             General Comments General comments (skin integrity, edema, etc.): pt's daughter in room on entry, concerned about pt going home, afraid that pt might fall and would not call her. Discussed services like Life Alert and Apple watch, RN        PT Goals (current goals can now be found in the care plan section) Acute Rehab PT Goals Patient Stated Goal: to improve activity tolerance PT Goal Formulation: With patient Time For Goal Achievement: 12/21/22 Potential to Achieve Goals: Good Progress towards PT goals: Progressing toward goals    Frequency  Min 1X/week       AM-PAC PT "6 Clicks" Mobility   Outcome Measure  Help needed turning from your back to your side while in a flat bed without using bedrails?: None Help needed moving from lying on your back to sitting on the side of a flat bed without using bedrails?: None Help needed moving to and from a bed to a chair (including a wheelchair)?: A Little Help needed standing up from a chair using your arms (e.g., wheelchair or bedside chair)?: A Little Help needed to walk in hospital room?: A Little Help needed climbing 3-5 steps with a railing? : A  Little 6 Click Score: 20    End of Session Equipment Utilized During Treatment: Gait belt Activity Tolerance: Patient tolerated treatment well Patient left: in bed;with call bell/phone within reach Nurse Communication: Mobility status PT Visit Diagnosis: Other abnormalities of gait and mobility (R26.89)     Time: 8119-1478 PT Time Calculation (min) (ACUTE ONLY): 10 min  Charges:    $Self Care/Home Management: 8-22 PT General Charges $$ ACUTE PT VISIT: 1 Visit                     Vera Furniss B. Beverely Risen PT, DPT Acute Rehabilitation Services Please use secure chat or  Call Office [phone]    Elon Alas [facility] 12/15/2022, 4:27 PM

## 2022-12-15 NOTE — Progress Notes (Signed)
Mobility Specialist Progress Note:   12/15/22 1200  Mobility  Activity Ambulated with assistance in hallway  Level of Assistance Contact guard assist, steadying assist  Assistive Device Four wheel walker  Distance Ambulated (ft) 200 ft  Activity Response Tolerated fair  Mobility Referral Yes  $Mobility charge 1 Mobility  Mobility Specialist Start Time (ACUTE ONLY) 1129  Mobility Specialist Stop Time (ACUTE ONLY) 1141  Mobility Specialist Time Calculation (min) (ACUTE ONLY) 12 min    Pre Mobility: 64 HR During Mobility: 69 HR Post Mobility:  65 HR  Pt received in bed, agreeable to mobility. C/o fatigue and not getting a lot of sleep, taking x2 seated rest breaks. VSS throughout and returned to room w/o fault. Pt left on EOB with call bell and all needs met.  D'Vante Earlene Plater Mobility Specialist Please contact via Special educational needs teacher or Rehab office at [phone]

## 2022-12-16 DIAGNOSIS — I110 Hypertensive heart disease with heart failure: Secondary | ICD-10-CM | POA: Insufficient documentation

## 2022-12-16 DIAGNOSIS — I4891 Unspecified atrial fibrillation: Secondary | ICD-10-CM | POA: Insufficient documentation

## 2022-12-16 DIAGNOSIS — J9601 Acute respiratory failure with hypoxia: Secondary | ICD-10-CM

## 2022-12-16 DIAGNOSIS — I5043 Acute on chronic combined systolic (congestive) and diastolic (congestive) heart failure: Secondary | ICD-10-CM

## 2022-12-16 HISTORY — DX: Acute respiratory failure with hypoxia: J96.01

## 2022-12-16 HISTORY — DX: Acute on chronic combined systolic (congestive) and diastolic (congestive) heart failure: I50.43

## 2022-12-16 HISTORY — DX: Unspecified atrial fibrillation: I48.91

## 2022-12-17 DIAGNOSIS — I272 Pulmonary hypertension, unspecified: Secondary | ICD-10-CM | POA: Diagnosis not present

## 2022-12-17 DIAGNOSIS — R9431 Abnormal electrocardiogram [ECG] [EKG]: Secondary | ICD-10-CM | POA: Diagnosis not present

## 2022-12-17 DIAGNOSIS — Z888 Allergy status to other drugs, medicaments and biological substances status: Secondary | ICD-10-CM | POA: Diagnosis not present

## 2022-12-17 DIAGNOSIS — I2722 Pulmonary hypertension due to left heart disease: Secondary | ICD-10-CM | POA: Diagnosis not present

## 2022-12-17 DIAGNOSIS — Z8673 Personal history of transient ischemic attack (TIA), and cerebral infarction without residual deficits: Secondary | ICD-10-CM | POA: Diagnosis not present

## 2022-12-17 DIAGNOSIS — Z8701 Personal history of pneumonia (recurrent): Secondary | ICD-10-CM | POA: Diagnosis not present

## 2022-12-17 DIAGNOSIS — Z87440 Personal history of urinary (tract) infections: Secondary | ICD-10-CM | POA: Diagnosis not present

## 2022-12-17 DIAGNOSIS — Z881 Allergy status to other antibiotic agents status: Secondary | ICD-10-CM | POA: Diagnosis not present

## 2022-12-17 DIAGNOSIS — I130 Hypertensive heart and chronic kidney disease with heart failure and stage 1 through stage 4 chronic kidney disease, or unspecified chronic kidney disease: Secondary | ICD-10-CM | POA: Diagnosis not present

## 2022-12-17 DIAGNOSIS — I4891 Unspecified atrial fibrillation: Secondary | ICD-10-CM | POA: Diagnosis not present

## 2022-12-17 DIAGNOSIS — M109 Gout, unspecified: Secondary | ICD-10-CM | POA: Diagnosis not present

## 2022-12-17 DIAGNOSIS — E7800 Pure hypercholesterolemia, unspecified: Secondary | ICD-10-CM | POA: Diagnosis not present

## 2022-12-17 DIAGNOSIS — Z1152 Encounter for screening for COVID-19: Secondary | ICD-10-CM | POA: Diagnosis not present

## 2022-12-17 DIAGNOSIS — D638 Anemia in other chronic diseases classified elsewhere: Secondary | ICD-10-CM | POA: Diagnosis not present

## 2022-12-17 DIAGNOSIS — R0902 Hypoxemia: Secondary | ICD-10-CM | POA: Diagnosis not present

## 2022-12-17 DIAGNOSIS — I444 Left anterior fascicular block: Secondary | ICD-10-CM | POA: Diagnosis not present

## 2022-12-17 DIAGNOSIS — I2510 Atherosclerotic heart disease of native coronary artery without angina pectoris: Secondary | ICD-10-CM | POA: Diagnosis not present

## 2022-12-17 DIAGNOSIS — E1122 Type 2 diabetes mellitus with diabetic chronic kidney disease: Secondary | ICD-10-CM | POA: Diagnosis not present

## 2022-12-17 DIAGNOSIS — N184 Chronic kidney disease, stage 4 (severe): Secondary | ICD-10-CM | POA: Diagnosis not present

## 2022-12-17 DIAGNOSIS — I4430 Unspecified atrioventricular block: Secondary | ICD-10-CM | POA: Diagnosis not present

## 2022-12-17 DIAGNOSIS — R069 Unspecified abnormalities of breathing: Secondary | ICD-10-CM | POA: Diagnosis not present

## 2022-12-17 DIAGNOSIS — I340 Nonrheumatic mitral (valve) insufficiency: Secondary | ICD-10-CM | POA: Diagnosis not present

## 2022-12-17 DIAGNOSIS — I440 Atrioventricular block, first degree: Secondary | ICD-10-CM | POA: Diagnosis not present

## 2022-12-17 DIAGNOSIS — E039 Hypothyroidism, unspecified: Secondary | ICD-10-CM | POA: Diagnosis not present

## 2022-12-17 DIAGNOSIS — I509 Heart failure, unspecified: Secondary | ICD-10-CM | POA: Diagnosis not present

## 2022-12-17 DIAGNOSIS — J9601 Acute respiratory failure with hypoxia: Secondary | ICD-10-CM | POA: Diagnosis not present

## 2022-12-17 DIAGNOSIS — K219 Gastro-esophageal reflux disease without esophagitis: Secondary | ICD-10-CM | POA: Diagnosis not present

## 2022-12-17 DIAGNOSIS — N179 Acute kidney failure, unspecified: Secondary | ICD-10-CM | POA: Diagnosis not present

## 2022-12-17 DIAGNOSIS — M1990 Unspecified osteoarthritis, unspecified site: Secondary | ICD-10-CM | POA: Diagnosis not present

## 2022-12-17 DIAGNOSIS — D631 Anemia in chronic kidney disease: Secondary | ICD-10-CM | POA: Diagnosis not present

## 2022-12-17 DIAGNOSIS — I480 Paroxysmal atrial fibrillation: Secondary | ICD-10-CM | POA: Diagnosis not present

## 2022-12-17 DIAGNOSIS — D509 Iron deficiency anemia, unspecified: Secondary | ICD-10-CM | POA: Diagnosis not present

## 2022-12-17 DIAGNOSIS — Z79899 Other long term (current) drug therapy: Secondary | ICD-10-CM | POA: Diagnosis not present

## 2022-12-17 DIAGNOSIS — I5023 Acute on chronic systolic (congestive) heart failure: Secondary | ICD-10-CM | POA: Diagnosis not present

## 2022-12-17 DIAGNOSIS — I252 Old myocardial infarction: Secondary | ICD-10-CM | POA: Diagnosis not present

## 2022-12-18 ENCOUNTER — Ambulatory Visit: Payer: Medicare Other | Admitting: Cardiology

## 2022-12-18 ENCOUNTER — Telehealth: Payer: Self-pay | Admitting: Gastroenterology

## 2022-12-18 DIAGNOSIS — I509 Heart failure, unspecified: Secondary | ICD-10-CM | POA: Diagnosis not present

## 2022-12-18 DIAGNOSIS — N184 Chronic kidney disease, stage 4 (severe): Secondary | ICD-10-CM | POA: Diagnosis not present

## 2022-12-18 DIAGNOSIS — I480 Paroxysmal atrial fibrillation: Secondary | ICD-10-CM | POA: Diagnosis not present

## 2022-12-18 DIAGNOSIS — I340 Nonrheumatic mitral (valve) insufficiency: Secondary | ICD-10-CM | POA: Diagnosis not present

## 2022-12-18 DIAGNOSIS — I2510 Atherosclerotic heart disease of native coronary artery without angina pectoris: Secondary | ICD-10-CM | POA: Diagnosis not present

## 2022-12-18 NOTE — Telephone Encounter (Signed)
Inbound call from patient's daughter requesting to cancel 11/18 [place] due to patient currently being at the ED. States they will rescheduled when patient is released. Please advise, thank you.

## 2022-12-18 NOTE — Telephone Encounter (Signed)
Mychart message sent to patient.

## 2022-12-18 NOTE — Telephone Encounter (Signed)
U/S has been cancelled

## 2022-12-19 DIAGNOSIS — I2510 Atherosclerotic heart disease of native coronary artery without angina pectoris: Secondary | ICD-10-CM | POA: Diagnosis not present

## 2022-12-19 DIAGNOSIS — N184 Chronic kidney disease, stage 4 (severe): Secondary | ICD-10-CM | POA: Diagnosis not present

## 2022-12-19 DIAGNOSIS — I509 Heart failure, unspecified: Secondary | ICD-10-CM | POA: Diagnosis not present

## 2022-12-19 DIAGNOSIS — I480 Paroxysmal atrial fibrillation: Secondary | ICD-10-CM | POA: Diagnosis not present

## 2022-12-19 LAB — PTH-RELATED PEPTIDE: PTH-related peptide: <2 pmol/L

## 2022-12-20 DIAGNOSIS — N184 Chronic kidney disease, stage 4 (severe): Secondary | ICD-10-CM | POA: Diagnosis not present

## 2022-12-20 DIAGNOSIS — I2510 Atherosclerotic heart disease of native coronary artery without angina pectoris: Secondary | ICD-10-CM | POA: Diagnosis not present

## 2022-12-20 DIAGNOSIS — I509 Heart failure, unspecified: Secondary | ICD-10-CM | POA: Diagnosis not present

## 2022-12-20 DIAGNOSIS — I480 Paroxysmal atrial fibrillation: Secondary | ICD-10-CM | POA: Diagnosis not present

## 2022-12-21 ENCOUNTER — Other Ambulatory Visit (HOSPITAL_COMMUNITY): Payer: Medicare Other

## 2022-12-21 DIAGNOSIS — I2510 Atherosclerotic heart disease of native coronary artery without angina pectoris: Secondary | ICD-10-CM | POA: Diagnosis not present

## 2022-12-21 DIAGNOSIS — I480 Paroxysmal atrial fibrillation: Secondary | ICD-10-CM | POA: Diagnosis not present

## 2022-12-21 DIAGNOSIS — I340 Nonrheumatic mitral (valve) insufficiency: Secondary | ICD-10-CM

## 2022-12-21 DIAGNOSIS — I272 Pulmonary hypertension, unspecified: Secondary | ICD-10-CM

## 2022-12-21 DIAGNOSIS — I509 Heart failure, unspecified: Secondary | ICD-10-CM | POA: Diagnosis not present

## 2022-12-21 DIAGNOSIS — N184 Chronic kidney disease, stage 4 (severe): Secondary | ICD-10-CM | POA: Diagnosis not present

## 2022-12-21 NOTE — Telephone Encounter (Signed)
Callback team please contact requesting provider and advised that patient was cardioverted on 12/08/2022 and is currently on Eliquis 2.5 mg daily.  Patient was not on Eliquis when initial request was obtained and will need updated request as well as adjustment to procedure date due to recent cardioversion.  Current procedure scheduled for 01/04/2023 and patient will require 4 weeks of uninterrupted AC post DCCV.  Thanks, Alden Server

## 2022-12-21 NOTE — Telephone Encounter (Signed)
I will fax notes to the surgeon's office to see notes from pre op APP.   Per [name], NP:  Callback team please contact requesting provider and advised that patient was cardioverted on 12/08/2022 and is currently on Eliquis 2.5 mg daily.  Patient was not on Eliquis when initial request was obtained and will need updated request as well as adjustment to procedure date due to recent cardioversion.  Current procedure scheduled for 01/04/2023 and patient will require 4 weeks of uninterrupted AC post DCCV.   Thanks, Alden Server

## 2022-12-21 NOTE — Telephone Encounter (Signed)
Please update clearance request.

## 2022-12-21 NOTE — Telephone Encounter (Signed)
Please see previous messages specially from cardiology Please reschedule EGD (at [facility]) And obtain cardiology clearance/hold Eliquis recommendations again for the new date. RG

## 2022-12-22 DIAGNOSIS — N184 Chronic kidney disease, stage 4 (severe): Secondary | ICD-10-CM | POA: Diagnosis not present

## 2022-12-22 DIAGNOSIS — I2510 Atherosclerotic heart disease of native coronary artery without angina pectoris: Secondary | ICD-10-CM | POA: Diagnosis not present

## 2022-12-22 DIAGNOSIS — I480 Paroxysmal atrial fibrillation: Secondary | ICD-10-CM | POA: Diagnosis not present

## 2022-12-22 DIAGNOSIS — I509 Heart failure, unspecified: Secondary | ICD-10-CM | POA: Diagnosis not present

## 2022-12-23 ENCOUNTER — Encounter (HOSPITAL_COMMUNITY): Admission: RE | Payer: Self-pay | Source: Home / Self Care

## 2022-12-23 ENCOUNTER — Ambulatory Visit (HOSPITAL_COMMUNITY): Admission: RE | Admit: 2022-12-23 | Payer: Medicare Other | Source: Home / Self Care | Admitting: Cardiology

## 2022-12-23 DIAGNOSIS — I509 Heart failure, unspecified: Secondary | ICD-10-CM | POA: Diagnosis not present

## 2022-12-23 DIAGNOSIS — N184 Chronic kidney disease, stage 4 (severe): Secondary | ICD-10-CM | POA: Diagnosis not present

## 2022-12-23 DIAGNOSIS — I480 Paroxysmal atrial fibrillation: Secondary | ICD-10-CM | POA: Diagnosis not present

## 2022-12-23 DIAGNOSIS — I2510 Atherosclerotic heart disease of native coronary artery without angina pectoris: Secondary | ICD-10-CM | POA: Diagnosis not present

## 2022-12-23 SURGERY — ATRIAL FIBRILLATION ABLATION
Anesthesia: General

## 2022-12-23 NOTE — Telephone Encounter (Signed)
Pt made aware of Cardiologist recommendation. Procedure on 01/04/2023 canceled. Pt made aware.  Pt was notified that we would contact her at a later date to reschedule her procedure.  Pt verbalized understanding with all questions answered.

## 2022-12-24 DIAGNOSIS — I509 Heart failure, unspecified: Secondary | ICD-10-CM | POA: Diagnosis not present

## 2022-12-24 DIAGNOSIS — N184 Chronic kidney disease, stage 4 (severe): Secondary | ICD-10-CM | POA: Diagnosis not present

## 2022-12-24 DIAGNOSIS — I2510 Atherosclerotic heart disease of native coronary artery without angina pectoris: Secondary | ICD-10-CM | POA: Diagnosis not present

## 2022-12-24 DIAGNOSIS — I480 Paroxysmal atrial fibrillation: Secondary | ICD-10-CM | POA: Diagnosis not present

## 2022-12-25 DIAGNOSIS — I2510 Atherosclerotic heart disease of native coronary artery without angina pectoris: Secondary | ICD-10-CM | POA: Diagnosis not present

## 2022-12-25 DIAGNOSIS — I480 Paroxysmal atrial fibrillation: Secondary | ICD-10-CM | POA: Diagnosis not present

## 2022-12-25 DIAGNOSIS — I509 Heart failure, unspecified: Secondary | ICD-10-CM | POA: Diagnosis not present

## 2022-12-25 DIAGNOSIS — N184 Chronic kidney disease, stage 4 (severe): Secondary | ICD-10-CM | POA: Diagnosis not present

## 2022-12-28 DIAGNOSIS — I110 Hypertensive heart disease with heart failure: Secondary | ICD-10-CM | POA: Diagnosis not present

## 2022-12-28 DIAGNOSIS — G4733 Obstructive sleep apnea (adult) (pediatric): Secondary | ICD-10-CM | POA: Diagnosis not present

## 2022-12-28 DIAGNOSIS — I252 Old myocardial infarction: Secondary | ICD-10-CM | POA: Diagnosis not present

## 2022-12-28 DIAGNOSIS — E785 Hyperlipidemia, unspecified: Secondary | ICD-10-CM | POA: Diagnosis not present

## 2022-12-28 DIAGNOSIS — I700 Atherosclerosis of aorta: Secondary | ICD-10-CM | POA: Diagnosis not present

## 2022-12-28 DIAGNOSIS — I455 Other specified heart block: Secondary | ICD-10-CM | POA: Diagnosis not present

## 2022-12-28 DIAGNOSIS — I5043 Acute on chronic combined systolic (congestive) and diastolic (congestive) heart failure: Secondary | ICD-10-CM | POA: Diagnosis not present

## 2022-12-28 DIAGNOSIS — I255 Ischemic cardiomyopathy: Secondary | ICD-10-CM | POA: Diagnosis not present

## 2022-12-28 DIAGNOSIS — M810 Age-related osteoporosis without current pathological fracture: Secondary | ICD-10-CM | POA: Diagnosis not present

## 2022-12-28 DIAGNOSIS — D509 Iron deficiency anemia, unspecified: Secondary | ICD-10-CM | POA: Diagnosis not present

## 2022-12-28 DIAGNOSIS — K219 Gastro-esophageal reflux disease without esophagitis: Secondary | ICD-10-CM | POA: Diagnosis not present

## 2022-12-28 DIAGNOSIS — E039 Hypothyroidism, unspecified: Secondary | ICD-10-CM | POA: Diagnosis not present

## 2022-12-28 DIAGNOSIS — K559 Vascular disorder of intestine, unspecified: Secondary | ICD-10-CM | POA: Diagnosis not present

## 2022-12-28 DIAGNOSIS — N179 Acute kidney failure, unspecified: Secondary | ICD-10-CM | POA: Diagnosis not present

## 2022-12-28 DIAGNOSIS — I4891 Unspecified atrial fibrillation: Secondary | ICD-10-CM | POA: Diagnosis not present

## 2022-12-28 DIAGNOSIS — I2510 Atherosclerotic heart disease of native coronary artery without angina pectoris: Secondary | ICD-10-CM | POA: Diagnosis not present

## 2022-12-28 DIAGNOSIS — J9601 Acute respiratory failure with hypoxia: Secondary | ICD-10-CM | POA: Diagnosis not present

## 2022-12-28 DIAGNOSIS — N184 Chronic kidney disease, stage 4 (severe): Secondary | ICD-10-CM | POA: Diagnosis not present

## 2022-12-28 DIAGNOSIS — I081 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: Secondary | ICD-10-CM | POA: Diagnosis not present

## 2022-12-28 DIAGNOSIS — D631 Anemia in chronic kidney disease: Secondary | ICD-10-CM | POA: Diagnosis not present

## 2022-12-28 DIAGNOSIS — Z950 Presence of cardiac pacemaker: Secondary | ICD-10-CM | POA: Diagnosis not present

## 2022-12-28 DIAGNOSIS — E1122 Type 2 diabetes mellitus with diabetic chronic kidney disease: Secondary | ICD-10-CM | POA: Diagnosis not present

## 2022-12-28 DIAGNOSIS — M109 Gout, unspecified: Secondary | ICD-10-CM | POA: Diagnosis not present

## 2022-12-28 DIAGNOSIS — K5900 Constipation, unspecified: Secondary | ICD-10-CM | POA: Diagnosis not present

## 2022-12-29 DIAGNOSIS — I2510 Atherosclerotic heart disease of native coronary artery without angina pectoris: Secondary | ICD-10-CM | POA: Diagnosis not present

## 2022-12-29 DIAGNOSIS — D631 Anemia in chronic kidney disease: Secondary | ICD-10-CM | POA: Diagnosis not present

## 2022-12-29 DIAGNOSIS — I5043 Acute on chronic combined systolic (congestive) and diastolic (congestive) heart failure: Secondary | ICD-10-CM | POA: Diagnosis not present

## 2022-12-29 DIAGNOSIS — J9601 Acute respiratory failure with hypoxia: Secondary | ICD-10-CM | POA: Diagnosis not present

## 2022-12-29 DIAGNOSIS — I110 Hypertensive heart disease with heart failure: Secondary | ICD-10-CM | POA: Diagnosis not present

## 2022-12-29 DIAGNOSIS — N179 Acute kidney failure, unspecified: Secondary | ICD-10-CM | POA: Diagnosis not present

## 2022-12-29 DIAGNOSIS — M810 Age-related osteoporosis without current pathological fracture: Secondary | ICD-10-CM | POA: Diagnosis not present

## 2022-12-29 DIAGNOSIS — I081 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: Secondary | ICD-10-CM | POA: Diagnosis not present

## 2022-12-29 DIAGNOSIS — I4891 Unspecified atrial fibrillation: Secondary | ICD-10-CM | POA: Diagnosis not present

## 2022-12-29 DIAGNOSIS — G4733 Obstructive sleep apnea (adult) (pediatric): Secondary | ICD-10-CM | POA: Diagnosis not present

## 2022-12-29 DIAGNOSIS — I700 Atherosclerosis of aorta: Secondary | ICD-10-CM | POA: Diagnosis not present

## 2022-12-29 DIAGNOSIS — N184 Chronic kidney disease, stage 4 (severe): Secondary | ICD-10-CM | POA: Diagnosis not present

## 2022-12-29 DIAGNOSIS — I252 Old myocardial infarction: Secondary | ICD-10-CM | POA: Diagnosis not present

## 2022-12-29 DIAGNOSIS — E1122 Type 2 diabetes mellitus with diabetic chronic kidney disease: Secondary | ICD-10-CM | POA: Diagnosis not present

## 2022-12-29 DIAGNOSIS — I455 Other specified heart block: Secondary | ICD-10-CM | POA: Diagnosis not present

## 2022-12-30 ENCOUNTER — Telehealth: Payer: Self-pay | Admitting: Cardiology

## 2022-12-30 NOTE — Telephone Encounter (Signed)
Left message for [facility], informed her Dr. Gershon Crane nurse and our procedure scheduler are not in the office this week. Will forward note to [facility] and April to follow-up next week to reschedule ablation.

## 2022-12-30 NOTE — Telephone Encounter (Signed)
Pt is calling to r/s Ablation

## 2023-01-04 ENCOUNTER — Encounter (HOSPITAL_COMMUNITY): Payer: Self-pay

## 2023-01-04 ENCOUNTER — Ambulatory Visit (HOSPITAL_COMMUNITY): Admit: 2023-01-04 | Payer: Medicare Other | Admitting: Gastroenterology

## 2023-01-04 DIAGNOSIS — I110 Hypertensive heart disease with heart failure: Secondary | ICD-10-CM | POA: Diagnosis not present

## 2023-01-04 DIAGNOSIS — J9601 Acute respiratory failure with hypoxia: Secondary | ICD-10-CM | POA: Diagnosis not present

## 2023-01-04 DIAGNOSIS — I4891 Unspecified atrial fibrillation: Secondary | ICD-10-CM | POA: Diagnosis not present

## 2023-01-04 SURGERY — ESOPHAGOGASTRODUODENOSCOPY (EGD) WITH PROPOFOL
Anesthesia: Monitor Anesthesia Care

## 2023-01-05 DIAGNOSIS — N179 Acute kidney failure, unspecified: Secondary | ICD-10-CM | POA: Diagnosis not present

## 2023-01-05 DIAGNOSIS — I110 Hypertensive heart disease with heart failure: Secondary | ICD-10-CM | POA: Diagnosis not present

## 2023-01-05 DIAGNOSIS — I2510 Atherosclerotic heart disease of native coronary artery without angina pectoris: Secondary | ICD-10-CM | POA: Diagnosis not present

## 2023-01-05 DIAGNOSIS — D631 Anemia in chronic kidney disease: Secondary | ICD-10-CM | POA: Diagnosis not present

## 2023-01-05 DIAGNOSIS — M810 Age-related osteoporosis without current pathological fracture: Secondary | ICD-10-CM | POA: Diagnosis not present

## 2023-01-05 DIAGNOSIS — E1122 Type 2 diabetes mellitus with diabetic chronic kidney disease: Secondary | ICD-10-CM | POA: Diagnosis not present

## 2023-01-05 DIAGNOSIS — I700 Atherosclerosis of aorta: Secondary | ICD-10-CM | POA: Diagnosis not present

## 2023-01-05 DIAGNOSIS — G4733 Obstructive sleep apnea (adult) (pediatric): Secondary | ICD-10-CM | POA: Diagnosis not present

## 2023-01-05 DIAGNOSIS — I5043 Acute on chronic combined systolic (congestive) and diastolic (congestive) heart failure: Secondary | ICD-10-CM | POA: Diagnosis not present

## 2023-01-05 DIAGNOSIS — J9601 Acute respiratory failure with hypoxia: Secondary | ICD-10-CM | POA: Diagnosis not present

## 2023-01-05 DIAGNOSIS — I081 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: Secondary | ICD-10-CM | POA: Diagnosis not present

## 2023-01-05 DIAGNOSIS — I4891 Unspecified atrial fibrillation: Secondary | ICD-10-CM | POA: Diagnosis not present

## 2023-01-05 DIAGNOSIS — I252 Old myocardial infarction: Secondary | ICD-10-CM | POA: Diagnosis not present

## 2023-01-05 DIAGNOSIS — N184 Chronic kidney disease, stage 4 (severe): Secondary | ICD-10-CM | POA: Diagnosis not present

## 2023-01-05 DIAGNOSIS — I455 Other specified heart block: Secondary | ICD-10-CM | POA: Diagnosis not present

## 2023-01-06 DIAGNOSIS — J9601 Acute respiratory failure with hypoxia: Secondary | ICD-10-CM | POA: Diagnosis not present

## 2023-01-06 DIAGNOSIS — I5043 Acute on chronic combined systolic (congestive) and diastolic (congestive) heart failure: Secondary | ICD-10-CM | POA: Diagnosis not present

## 2023-01-06 DIAGNOSIS — N184 Chronic kidney disease, stage 4 (severe): Secondary | ICD-10-CM | POA: Diagnosis not present

## 2023-01-06 DIAGNOSIS — N179 Acute kidney failure, unspecified: Secondary | ICD-10-CM | POA: Diagnosis not present

## 2023-01-06 DIAGNOSIS — I4891 Unspecified atrial fibrillation: Secondary | ICD-10-CM | POA: Diagnosis not present

## 2023-01-06 DIAGNOSIS — I700 Atherosclerosis of aorta: Secondary | ICD-10-CM | POA: Diagnosis not present

## 2023-01-06 DIAGNOSIS — G4733 Obstructive sleep apnea (adult) (pediatric): Secondary | ICD-10-CM | POA: Diagnosis not present

## 2023-01-06 DIAGNOSIS — M810 Age-related osteoporosis without current pathological fracture: Secondary | ICD-10-CM | POA: Diagnosis not present

## 2023-01-06 DIAGNOSIS — D631 Anemia in chronic kidney disease: Secondary | ICD-10-CM | POA: Diagnosis not present

## 2023-01-06 DIAGNOSIS — I110 Hypertensive heart disease with heart failure: Secondary | ICD-10-CM | POA: Diagnosis not present

## 2023-01-06 DIAGNOSIS — I455 Other specified heart block: Secondary | ICD-10-CM | POA: Diagnosis not present

## 2023-01-06 DIAGNOSIS — I2510 Atherosclerotic heart disease of native coronary artery without angina pectoris: Secondary | ICD-10-CM | POA: Diagnosis not present

## 2023-01-06 DIAGNOSIS — I252 Old myocardial infarction: Secondary | ICD-10-CM | POA: Diagnosis not present

## 2023-01-06 DIAGNOSIS — E1122 Type 2 diabetes mellitus with diabetic chronic kidney disease: Secondary | ICD-10-CM | POA: Diagnosis not present

## 2023-01-06 DIAGNOSIS — I081 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: Secondary | ICD-10-CM | POA: Diagnosis not present

## 2023-01-06 NOTE — Telephone Encounter (Signed)
Pt aware I would discuss things further with Dr. Elberta Fortis as her last Hgb on 11/12 was still on lower side at 8.4.  informed that  he would move forward with ablation at this time.   Advised to call Dr Urban Gibson (GI) office to schedule an appt for follow up/evaluation. Pt is not taking blood thinner at this time. Pt sees Wallis Bamberg, NP on 12/10 and will let her know when she is scheduled to see GI, so they can let me know. Informed that once I know when she is scheduled to see GI that I will discuss plan of care with Dr. Elberta Fortis and next steps. Patient verbalized understanding and agreeable to plan.

## 2023-01-11 DIAGNOSIS — J9601 Acute respiratory failure with hypoxia: Secondary | ICD-10-CM | POA: Diagnosis not present

## 2023-01-11 DIAGNOSIS — N184 Chronic kidney disease, stage 4 (severe): Secondary | ICD-10-CM | POA: Diagnosis not present

## 2023-01-11 DIAGNOSIS — N179 Acute kidney failure, unspecified: Secondary | ICD-10-CM | POA: Diagnosis not present

## 2023-01-11 DIAGNOSIS — I2510 Atherosclerotic heart disease of native coronary artery without angina pectoris: Secondary | ICD-10-CM | POA: Diagnosis not present

## 2023-01-11 DIAGNOSIS — E1122 Type 2 diabetes mellitus with diabetic chronic kidney disease: Secondary | ICD-10-CM | POA: Diagnosis not present

## 2023-01-11 DIAGNOSIS — I700 Atherosclerosis of aorta: Secondary | ICD-10-CM | POA: Diagnosis not present

## 2023-01-11 DIAGNOSIS — I5043 Acute on chronic combined systolic (congestive) and diastolic (congestive) heart failure: Secondary | ICD-10-CM | POA: Diagnosis not present

## 2023-01-11 DIAGNOSIS — I252 Old myocardial infarction: Secondary | ICD-10-CM | POA: Diagnosis not present

## 2023-01-11 DIAGNOSIS — I455 Other specified heart block: Secondary | ICD-10-CM | POA: Diagnosis not present

## 2023-01-11 DIAGNOSIS — I4891 Unspecified atrial fibrillation: Secondary | ICD-10-CM | POA: Diagnosis not present

## 2023-01-11 DIAGNOSIS — I081 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: Secondary | ICD-10-CM | POA: Diagnosis not present

## 2023-01-11 DIAGNOSIS — G4733 Obstructive sleep apnea (adult) (pediatric): Secondary | ICD-10-CM | POA: Diagnosis not present

## 2023-01-11 DIAGNOSIS — D631 Anemia in chronic kidney disease: Secondary | ICD-10-CM | POA: Diagnosis not present

## 2023-01-11 DIAGNOSIS — M810 Age-related osteoporosis without current pathological fracture: Secondary | ICD-10-CM | POA: Diagnosis not present

## 2023-01-11 DIAGNOSIS — I110 Hypertensive heart disease with heart failure: Secondary | ICD-10-CM | POA: Diagnosis not present

## 2023-01-11 NOTE — Telephone Encounter (Signed)
PT is calling to have [place] rescheduled. Please advise.

## 2023-01-11 NOTE — Progress Notes (Signed)
 " Cardiology Office Note:  .   Date:  01/12/2023  ID:  Amanda [name], DOB 04-18-1940, MRN [id number] PCP: Street, Lonni HERO, MD  [facility] Providers Cardiologist:  Lamar Fitch, MD Electrophysiologist:  Will Gladis Norton, MD    History of Present Illness: .   Amanda [name] is a 82 y.o. female with a past medical history of atrial fibrillation s/p Watchman, CKD stage IV, CAD, CHB s/p PPM, HFpEF, OSA on CPAP, DM 2, dyslipidemia, stroke.  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 Norton on 11/16/2022 with concerns of increasing shortness of breath, unfortunately she was back in atrial fibrillation with significant fatigue.  Plans were made to proceed with an ablation.  Most recently admitted on 12/06/2022 to 12/15/2022 for atrial fibrillation RVR.  She underwent DCCV x 2, plans were made for atrial fibrillation ablation-however this had to be rescheduled secondary to anemia, she was started on amiodarone  400 mg twice daily.  Following her discharge from [facility], she was admitted to [facility] on 12/17/2022 - 12/25/2022 with acute hypoxic respiratory failure, heart failure exacerbation.  VQ scan was indeterminate for PE, very elevated BNP of 9260.  Nephrology was consulted and renal ultrasound was obtained which revealed no significant abnormalities.  Cardiology was consulted, calcium was elevated she was diuresed however her creatinine continued to rise and her Lasix  was ultimately discontinued.  Ranexa  was  discontinued, amiodarone  dose was decreased to 200 mg twice daily, she maintained sinus rhythm, cardiology noted that they would have her pacemaker reprogrammed for a base rate of 60 bpm.  She presents today for follow-up after repeat hospitalizations as outlined above.  She is feeling okay, not quite back to herself yet, still has home health nursing working with her in the home.  She plans to see Dr. Charlanne on 01/18/2023.  She is still hopeful to proceed with an ablation with Dr. Norton however she needs to be evaluated by GI first.  She has pedal edema however she states is at her baseline.  She states she went back into atrial fibrillation this morning.  She denies chest pain, palpitations, dyspnea, pnd, orthopnea, n, v, dizziness, syncope,  weight gain, or early satiety.   ROS: Review of Systems  Constitutional:  Positive for malaise/fatigue.  Cardiovascular:  Positive for palpitations.     Studies Reviewed: .        Cardiac Studies & Procedures       ECHOCARDIOGRAM  ECHOCARDIOGRAM COMPLETE 05/01/2022  Narrative ECHOCARDIOGRAM REPORT    Patient Name:   Amanda [name] Date of Exam: 05/01/2022 Medical Rec #:  [id number]       Height:       62.0 in Accession #:    [id number]      Weight:       178.2 lb Date of Birth:  [birth year]      BSA:          1.820 m Patient Age:    81 years        BP:  126/70 mmHg Patient Gender: F               HR:           63 bpm. Exam Location:  [place]  Procedure: 2D Echo, Cardiac Doppler, Color Doppler and Strain Analysis  Indications:    Paroxysmal atrial fibrillation (HCC) [I48.0 (ICD-10-CM)]; Chronic systolic CHF (congestive heart failure) (HCC) [I50.22 (ICD-10-CM)]; Coronary artery disease involving native coronary artery of native heart without angina pectoris [I25.10 (ICD-10-CM)]  History:        Patient has prior history of Echocardiogram examinations, most recent 12/23/2020. Previous Myocardial Infarction, Pacemaker, Mitral  Valve Disease, Arrythmias:Atrial Fibrillation; Risk Factors:Hypertension.  Sonographer:    Charlie Jointer RDCS Referring Phys: [id number] ROBERT J KRASOWSKI  IMPRESSIONS   1. Left ventricular ejection fraction, by estimation, is 70 to 75%. The left ventricle has hyperdynamic function. The left ventricle has no regional wall motion abnormalities. There is moderate left ventricular hypertrophy. Left ventricular diastolic parameters are consistent with Grade II diastolic dysfunction (pseudonormalization). The average left ventricular global longitudinal strain is 16.4 %. The global longitudinal strain is abnormal. 2. Right ventricular systolic function is normal. The right ventricular size is normal. There is normal pulmonary artery systolic pressure. 3. Left atrial size was mildly dilated. 4. The mitral valve is normal in structure. Moderate mitral valve regurgitation. No evidence of mitral stenosis. Moderate mitral annular calcification. 5. The aortic valve is calcified. There is mild calcification of the aortic valve. There is mild thickening of the aortic valve. Aortic valve regurgitation is not visualized. Mild aortic valve stenosis. Aortic valve mean gradient measures 8.4 mmHg. 6. The inferior vena cava is normal in size with greater than 50% respiratory variability, suggesting right atrial pressure of 3 mmHg.  FINDINGS Left Ventricle: Left ventricular ejection fraction, by estimation, is 70 to 75%. The left ventricle has hyperdynamic function. The left ventricle has no regional wall motion abnormalities. The average left ventricular global longitudinal strain is 16.4 %. The global longitudinal strain is abnormal. The left ventricular internal cavity size was normal in size. There is moderate left ventricular hypertrophy. Left ventricular diastolic parameters are consistent with Grade II diastolic dysfunction (pseudonormalization).  Right Ventricle: The right ventricular size is normal. No  increase in right ventricular wall thickness. Right ventricular systolic function is normal. There is normal pulmonary artery systolic pressure. The tricuspid regurgitant velocity is 2.78 m/s, and with an assumed right atrial pressure of 3 mmHg, the estimated right ventricular systolic pressure is 33.9 mmHg.  Left Atrium: Left atrial size was mildly dilated.  Right Atrium: Right atrial size was normal in size.  Pericardium: There is no evidence of pericardial effusion.  Mitral Valve: The mitral valve is normal in structure. Moderate mitral annular calcification. Moderate mitral valve regurgitation. No evidence of mitral valve stenosis. MV peak gradient, 6.7 mmHg. The mean mitral valve gradient is 2.0 mmHg.  Tricuspid Valve: The tricuspid valve is normal in structure. Tricuspid valve regurgitation is mild . No evidence of tricuspid stenosis.  Aortic Valve: The aortic valve is calcified. There is mild calcification of the aortic valve. There is mild thickening of the aortic valve. Aortic valve regurgitation is not visualized. Mild aortic stenosis is present. Aortic valve mean gradient measures 8.4 mmHg. Aortic valve peak gradient measures 14.9 mmHg. Aortic valve area, by VTI measures 1.42 cm.  Pulmonic Valve: The pulmonic valve was normal in structure. Pulmonic valve regurgitation is not visualized. No evidence of pulmonic stenosis.  Aorta: The aortic root is normal  in size and structure.  Venous: The inferior vena cava is normal in size with greater than 50% respiratory variability, suggesting right atrial pressure of 3 mmHg.  IAS/Shunts: No atrial level shunt detected by color flow Doppler.  Additional Comments: A device lead is visualized in the right atrium and right ventricle.   LEFT VENTRICLE PLAX 2D LVIDd:         3.50 cm   Diastology LVIDs:         1.70 cm   LV e' medial:    5.73 cm/s LV PW:         1.70 cm   LV E/e' medial:  18.2 LV IVS:        1.80 cm   LV e' lateral:   7.58  cm/s LVOT diam:     1.90 cm   LV E/e' lateral: 13.8 LV SV:         64 LV SV Index:   35        2D Longitudinal Strain LVOT Area:     2.84 cm  2D Strain GLS Avg:     16.4 %   RIGHT VENTRICLE             IVC RV Basal diam:  3.00 cm     IVC diam: 1.20 cm RV Mid diam:    2.30 cm RV S prime:     12.53 cm/s TAPSE (M-mode): 2.0 cm  LEFT ATRIUM             Index        RIGHT ATRIUM           Index LA diam:        4.00 cm 2.20 cm/m   RA Area:     11.90 cm LA Vol (A2C):   65.7 ml 36.10 ml/m  RA Volume:   24.50 ml  13.46 ml/m LA Vol (A4C):   50.0 ml 27.47 ml/m LA Biplane Vol: 60.2 ml 33.07 ml/m AORTIC VALVE AV Area (Vmax):    1.42 cm AV Area (Vmean):   1.34 cm AV Area (VTI):     1.42 cm AV Vmax:           193.20 cm/s AV Vmean:          132.000 cm/s AV VTI:            0.451 m AV Peak Grad:      14.9 mmHg AV Mean Grad:      8.4 mmHg LVOT Vmax:         96.93 cm/s LVOT Vmean:        62.600 cm/s LVOT VTI:          0.226 m LVOT/AV VTI ratio: 0.50  AORTA Ao Root diam: 2.90 cm Ao Asc diam:  3.50 cm Ao Desc diam: 1.80 cm  MITRAL VALVE                  TRICUSPID VALVE MV Area (PHT): 3.02 cm       TR Peak grad:   30.9 mmHg MV Area VTI:   1.52 cm       TR Vmax:        278.00 cm/s MV Peak grad:  6.7 mmHg MV Mean grad:  2.0 mmHg       SHUNTS MV Vmax:       1.29 m/s       Systemic VTI:  0.23 m MV Vmean:      74.1 cm/s  Systemic Diam: 1.90 cm MV Decel Time: 251 msec MR Peak grad:    161.8 mmHg MR Mean grad:    102.0 mmHg MR Vmax:         636.00 cm/s MR Vmean:        475.5 cm/s MR PISA:         6.28 cm MR PISA Eff ROA: 38 mm MR PISA Radius:  1.00 cm MV E velocity: 104.50 cm/s MV A velocity: 75.45 cm/s MV E/A ratio:  1.39  Lamar Fitch MD Electronically signed by Lamar Fitch MD Signature Date/Time: 05/01/2022/12:13:11 PM    Final   TEE  ECHO TEE 09/30/2022  Narrative TRANSESOPHOGEAL ECHO REPORT    Patient Name:   Amanda [name] Date of Exam:  09/30/2022 Medical Rec #:  [id number]       Height:       63.0 in Accession #:    [id number]      Weight:       179.9 lb Date of Birth:  April 18, 1940      BSA:          1.849 m Patient Age:    81 years        BP:           151/100 mmHg Patient Gender: F               HR:           74 bpm. Exam Location:  Outpatient  Procedure: Transesophageal Echo, 3D Echo, Color Doppler and Cardiac Doppler  Indications:     thrombus on pacemaker leads  History:         Patient has prior history of Echocardiogram examinations, most recent 08/12/2022. CAD, Pacemaker, chronic kidney disease, Arrythmias:Atrial Fibrillation; Risk Factors:Diabetes, Dyslipidemia, Sleep Apnea and Hypertension.  Sonographer:     Tinnie Barefoot RDCS Referring Phys:  [id number] BRIDGETTE CHRISTOPHER Diagnosing Phys: Shelda Bruckner MD  PROCEDURE: After discussion of the risks and benefits of a TEE, an informed consent was obtained from the patient. The transesophogeal probe was passed without difficulty through the esophogus of the patient. Imaged were obtained with the patient in a left lateral decubitus position. Sedation performed by different physician. The patient was monitored while under deep sedation. Anesthestetic sedation was provided intravenously by Anesthesiology: 270mg  of Propofol , 80mg  of Lidocaine . The patient developed no complications during the procedure. A successful direct current cardioversion was performed at 120 joules with 1 attempt.  IMPRESSIONS   1. Left ventricular ejection fraction, by estimation, is 55 to 60%. The left ventricle has normal function. The left ventricle has no regional wall motion abnormalities. 2. Right ventricular systolic function is normal. The right ventricular size is normal. 3. Watchman device present in LAA, well seated without residual flow. Left atrial size was mildly dilated. No left atrial/left atrial appendage thrombus was detected. 4. The mitral valve is degenerative.  Moderate to severe mitral valve regurgitation. No evidence of mitral stenosis. 5. Tricuspid valve regurgitation is severe. 6. The aortic valve is tricuspid. There is mild calcification of the aortic valve. Aortic valve regurgitation is not visualized. No aortic stenosis is present. 7. There is Severe (Grade IV) atheroma plaque involving the descending aorta. 8. Agitated saline contrast bubble study was negative, with no evidence of any interatrial shunt.  Comparison(s): Fibrinous strands on pacer leads not consistent with thrombus. No right to left shunt. Successful cardioversion.  FINDINGS Left Ventricle: Left ventricular ejection fraction, by estimation, is 55 to 60%. The left  ventricle has normal function. The left ventricle has no regional wall motion abnormalities. The left ventricular internal cavity size was normal in size.  Right Ventricle: There are two small fibrinous strands, one on the RA lead and one on the RV lead. These appear similar to prior. The right ventricular size is normal. No increase in right ventricular wall thickness. Right ventricular systolic function is normal.  Left Atrium: Watchman device present in LAA, well seated without residual flow. Left atrial size was mildly dilated. No left atrial/left atrial appendage thrombus was detected.  Right Atrium: Right atrial size was normal in size.  Pericardium: There is no evidence of pericardial effusion.  Mitral Valve: The mitral valve is degenerative in appearance. Moderate to severe mitral valve regurgitation. No evidence of mitral valve stenosis. There is no evidence of mitral valve vegetation.  Tricuspid Valve: The tricuspid valve is normal in structure. Tricuspid valve regurgitation is severe. No evidence of tricuspid stenosis. There is no evidence of tricuspid valve vegetation.  Aortic Valve: The aortic valve is tricuspid. There is mild calcification of the aortic valve. Aortic valve regurgitation is not  visualized. No aortic stenosis is present. There is no evidence of aortic valve vegetation.  Pulmonic Valve: The pulmonic valve was grossly normal. Pulmonic valve regurgitation is trivial. No evidence of pulmonic stenosis. There is no evidence of pulmonic valve vegetation.  Aorta: The aortic root and ascending aorta are structurally normal, with no evidence of dilitation. 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  IAS/Shunts: No atrial level shunt detected by color flow Doppler. Agitated saline contrast was given intravenously to evaluate for intracardiac shunting. Agitated saline contrast bubble study was negative, with no evidence of any interatrial shunt.  Additional Comments: A device lead is visualized in the right atrium and right ventricle. Spectral Doppler performed.  MR Peak grad:    130.4 mmHg MR Mean grad:    91.0 mmHg MR Vmax:         571.00 cm/s MR Vmean:        459.0 cm/s MR PISA:         1.90 cm MR PISA Eff ROA: 13 mm MR PISA Radius:  0.55 cm  Shelda Bruckner MD Electronically signed by Shelda Bruckner MD Signature Date/Time: 10/01/2022/12:50:36 PM    Final   MONITORS  CARDIAC EVENT MONITOR 09/01/2017  Narrative Niels Bar, DOB 1940-05-20, MRN [id number]  EVENT MONITOR REPORT:   Patient was monitored from 09/01/2017 to 09/30/2017. Indication:                    Paroxysmal atrial fibrillation Ordering physician:  Lamar JINNY Fitch, MD Referring physician:  Lamar JINNY Fitch MD  Patient wear event recorder for 26 days.  The eighth trigger events.   Baseline rhythm: Sinus rhythm   Atrial arrhythmia:.  Of APCs felt as fluttering or palpitations.  No clear-cut sustained arrhythmia.  No clear-cut atrial fibrillation identified  Ventricular arrhythmia: None  Conduction abnormality: None  Symptoms: A trigger events some because of palpitations patient got some APCs during those episodes.   Conclusion: APCs  felt as palpitations. No recurrence of atrial fibrillation noted.  Interpreting  cardiologist: Lamar Fitch, MD Date: 10/11/2017 10:12 AM           Risk Assessment/Calculations:    CHA2DS2-VASc Score = 9   This indicates a 12.2% annual risk of stroke. The patient's score is based upon: CHF History: 1 HTN History: 1 Diabetes History: 1 Stroke History:  2 Vascular Disease History: 1 Age Score: 2 Gender Score: 1    HYPERTENSION CONTROL Vitals:   01/12/23 1053 01/12/23 1547  BP: (!) 150/80 (!) 150/80    The patient's blood pressure is elevated above target today.  In order to address the patient's elevated BP: Blood pressure will be monitored at home to determine if medication changes need to be made.          Physical Exam:   VS:  BP (!) 150/80   Pulse 99   Ht 5' 3 (1.6 m)   Wt 167 lb (75.8 kg)   SpO2 97%   BMI 29.58 kg/m    Wt Readings from Last 3 Encounters:  01/12/23 167 lb (75.8 kg)  12/15/22 175 lb 4.3 oz (79.5 kg)  11/30/22 185 lb (83.9 kg)    GEN: Well nourished, well developed in no acute distress NECK: No JVD; No carotid bruits CARDIAC: Irregularly irregular, no murmurs, rubs, gallops RESPIRATORY:  Clear to auscultation without rales, wheezing or rhonchi  ABDOMEN: Soft, non-tender, non-distended EXTREMITIES: +1 pitting edema; No deformity   ASSESSMENT AND PLAN: .   PAF/hypercoagulable state/on amiodarone  therapy - s/p Watchman Device in 2018--not anticoagulated secondary to frequent GI bleeds however she was retried with low-dose Eliquis  during her most recent hospitalization but was unable to tolerate this, complex course of PAF as outlined above in the HPI. States she went into AF this morning. Plans to have an ablation after evaluated by Dr. Charlanne for recurrent GI bleeds.  Currently on amiodarone  200 mg twice daily--TSH on 12/06/2022 was 2.51, LFTs were WNL on 12/12/2022.   CHB/presence of PPM-follows with EP, during her most recent hospitalization at  [facility] her base rate was increased to 60 bpm.  HFpEF-NYHA class II, euvolemic.  GDMT is evident secondary to kidney dysfunction.  Continue Demadex  20 mg twice daily.  CAD-s/p PTCA and DES of mRCA in 2018, Stable with no anginal symptoms. No indication for ischemic evaluation.    CKD stage IV -follows with nephrology, most recent far was 19. Will see nephrology in January.        Dispo: Follow up in 3 weeks with Dr. Krasowski. CBC, BMET.   Signed, Delon JAYSON Hoover, NP  "

## 2023-01-11 NOTE — Telephone Encounter (Signed)
Pt has missed several of her recent test that was scheduled by Dr. Chales Abrahams. Recommendations from Cardiology office was to scheduled an office visit with Dr. Chales Abrahams. Pt was scheduled for an office visit with Dr. Chales Abrahams on 01/18/2023 at 11:00 AM. Pt made aware. Pt verbalized understanding with all questions answered.

## 2023-01-12 ENCOUNTER — Ambulatory Visit: Payer: Medicare Other | Attending: Cardiology | Admitting: Cardiology

## 2023-01-12 ENCOUNTER — Encounter: Payer: Self-pay | Admitting: Cardiology

## 2023-01-12 VITALS — BP 150/80 | HR 99 | Ht 63.0 in | Wt 167.0 lb

## 2023-01-12 DIAGNOSIS — I4819 Other persistent atrial fibrillation: Secondary | ICD-10-CM | POA: Diagnosis not present

## 2023-01-12 DIAGNOSIS — I5030 Unspecified diastolic (congestive) heart failure: Secondary | ICD-10-CM | POA: Diagnosis not present

## 2023-01-12 DIAGNOSIS — I442 Atrioventricular block, complete: Secondary | ICD-10-CM | POA: Diagnosis not present

## 2023-01-12 DIAGNOSIS — Z950 Presence of cardiac pacemaker: Secondary | ICD-10-CM | POA: Diagnosis not present

## 2023-01-12 DIAGNOSIS — E782 Mixed hyperlipidemia: Secondary | ICD-10-CM | POA: Diagnosis not present

## 2023-01-12 DIAGNOSIS — Z79899 Other long term (current) drug therapy: Secondary | ICD-10-CM

## 2023-01-12 DIAGNOSIS — Z95818 Presence of other cardiac implants and grafts: Secondary | ICD-10-CM

## 2023-01-12 NOTE — Patient Instructions (Addendum)
Please keep a BP log for 2 weeks and send by MyChart or mail.                         Name and DOB__________________________   Blood Pressure Record Sheet To take your blood pressure, you will need a blood pressure machine. You can buy a blood pressure machine (blood pressure monitor) at your clinic, drug store, or online. When choosing one, consider: An automatic monitor that has an arm cuff. A cuff that wraps snugly around your upper arm. You should be able to fit only one finger between your arm and the cuff. A device that stores blood pressure reading results. Do not choose a monitor that measures your blood pressure from your wrist or finger. Follow your health care provider's instructions for how to take your blood pressure. To use this form: Get one reading in the morning (a.m.) 1-2 hours after you take any medicines. Get one reading in the evening (p.m.) before supper.   Blood pressure log Date: _______________________  a.m. _____________________(1st reading) HR___________            p.m. _____________________(2nd reading) HR__________  Date: _______________________  a.m. _____________________(1st reading) HR___________            p.m. _____________________(2nd reading) HR__________  Date: _______________________  a.m. _____________________(1st reading) HR___________            p.m. _____________________(2nd reading) HR__________  Date: _______________________  a.m. _____________________(1st reading) HR___________            p.m. _____________________(2nd reading) HR__________  Date: _______________________  a.m. _____________________(1st reading) HR___________            p.m. _____________________(2nd reading) HR__________  Date: _______________________  a.m. _____________________(1st reading) HR___________            p.m. _____________________(2nd reading) HR__________  Date: _______________________  a.m. _____________________(1st reading)  HR___________            p.m. _____________________(2nd reading) HR__________   This information is not intended to replace advice given to you by your health care provider. Make sure you discuss any questions you have with your health care provider. Document Revised: 05/10/2019 Document Reviewed: 05/10/2019 Elsevier Patient Education  2021 Elsevier Inc.  Medication Instructions:  Your physician recommends that you continue on your current medications as directed. Please refer to the Current Medication list given to you today.  *If you need a refill on your cardiac medications before your next appointment, please call your pharmacy*   Lab Work: Your physician recommends that you have labs done in the office today. Your test included  basic metabolic panel, and complete blood count. If you have labs (blood work) drawn today and your tests are completely normal, you will receive your results only by: MyChart Message (if you have MyChart) OR A paper copy in the mail If you have any lab test that is abnormal or we need to change your treatment, we will call you to review the results.   Testing/Procedures: None Ordered   Follow-Up: At [facility], you and your health needs are our priority.  As part of our continuing mission to provide you with exceptional heart care, we have created designated Provider Care Teams.  These Care Teams include your primary Cardiologist (physician) and Advanced Practice Providers (APPs -  Physician Assistants and Nurse Practitioners) who all work together to provide you with the care you need, when you need it.  We recommend signing up for the patient  portal called "MyChart".  Sign up information is provided on this After Visit Summary.  MyChart is used to connect with patients for Virtual Visits (Telemedicine).  Patients are able to view lab/test results, encounter notes, upcoming appointments, etc.  Non-urgent messages can be sent to your provider as well.    To learn more about what you can do with MyChart, go to [email or web address].    Your next appointment:   4 week(s) with Dr. Bing Matter

## 2023-01-12 NOTE — Telephone Encounter (Addendum)
Pt was contacted to follow up on scheduling procedure. Pt stated that she had an episode of Afibb this AM at 1:30 AM and she is scheduled to see her Cardiologist today at 11:00 AM. Pt has previoulsy scheudled office visit to see Dr. Chales Abrahams on 01/18/2023 at 11:00 AM. Pt reminded of appointment and encouraged to be sure that she keeps it. Reminder placed in Epic to follow up after Office Visit with Dr. Chales Abrahams.

## 2023-01-13 DIAGNOSIS — I455 Other specified heart block: Secondary | ICD-10-CM | POA: Diagnosis not present

## 2023-01-13 DIAGNOSIS — E1122 Type 2 diabetes mellitus with diabetic chronic kidney disease: Secondary | ICD-10-CM | POA: Diagnosis not present

## 2023-01-13 DIAGNOSIS — I081 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: Secondary | ICD-10-CM | POA: Diagnosis not present

## 2023-01-13 DIAGNOSIS — G4733 Obstructive sleep apnea (adult) (pediatric): Secondary | ICD-10-CM | POA: Diagnosis not present

## 2023-01-13 DIAGNOSIS — M810 Age-related osteoporosis without current pathological fracture: Secondary | ICD-10-CM | POA: Diagnosis not present

## 2023-01-13 DIAGNOSIS — I252 Old myocardial infarction: Secondary | ICD-10-CM | POA: Diagnosis not present

## 2023-01-13 DIAGNOSIS — N184 Chronic kidney disease, stage 4 (severe): Secondary | ICD-10-CM | POA: Diagnosis not present

## 2023-01-13 DIAGNOSIS — N179 Acute kidney failure, unspecified: Secondary | ICD-10-CM | POA: Diagnosis not present

## 2023-01-13 DIAGNOSIS — I700 Atherosclerosis of aorta: Secondary | ICD-10-CM | POA: Diagnosis not present

## 2023-01-13 DIAGNOSIS — I4891 Unspecified atrial fibrillation: Secondary | ICD-10-CM | POA: Diagnosis not present

## 2023-01-13 DIAGNOSIS — I110 Hypertensive heart disease with heart failure: Secondary | ICD-10-CM | POA: Diagnosis not present

## 2023-01-13 DIAGNOSIS — I5043 Acute on chronic combined systolic (congestive) and diastolic (congestive) heart failure: Secondary | ICD-10-CM | POA: Diagnosis not present

## 2023-01-13 DIAGNOSIS — I2510 Atherosclerotic heart disease of native coronary artery without angina pectoris: Secondary | ICD-10-CM | POA: Diagnosis not present

## 2023-01-13 DIAGNOSIS — D631 Anemia in chronic kidney disease: Secondary | ICD-10-CM | POA: Diagnosis not present

## 2023-01-13 DIAGNOSIS — J9601 Acute respiratory failure with hypoxia: Secondary | ICD-10-CM | POA: Diagnosis not present

## 2023-01-13 LAB — BASIC METABOLIC PANEL WITH GFR
BUN/Creatinine Ratio: 17 (ref 12–28)
BUN: 53 mg/dL — ABNORMAL HIGH (ref 8–27)
CO2: 24 mmol/L (ref 20–29)
Calcium: 11.3 mg/dL — ABNORMAL HIGH (ref 8.7–10.3)
Chloride: 100 mmol/L (ref 96–106)
Creatinine, Ser: 3.08 mg/dL — ABNORMAL HIGH (ref 0.57–1.00)
Glucose: 150 mg/dL — ABNORMAL HIGH (ref 70–99)
Potassium: 4.4 mmol/L (ref 3.5–5.2)
Sodium: 141 mmol/L (ref 134–144)
eGFR: 15 mL/min/1.73 — ABNORMAL LOW

## 2023-01-13 LAB — CBC
Hematocrit: 36.9 % (ref 34.0–46.6)
Hemoglobin: 11 g/dL — ABNORMAL LOW (ref 11.1–15.9)
MCH: 27.1 pg (ref 26.6–33.0)
MCHC: 29.8 g/dL — ABNORMAL LOW (ref 31.5–35.7)
MCV: 91 fL (ref 79–97)
Platelets: 181 x10E3/uL (ref 150–450)
RBC: 4.06 x10E6/uL (ref 3.77–5.28)
RDW: 16.2 % — ABNORMAL HIGH (ref 11.7–15.4)
WBC: 4.5 x10E3/uL (ref 3.4–10.8)

## 2023-01-14 ENCOUNTER — Telehealth: Payer: Self-pay | Admitting: Emergency Medicine

## 2023-01-14 NOTE — Telephone Encounter (Signed)
-----   Message from Amanda [name] sent at 01/13/2023 11:42 AM EST ----- Kidney function slightly worse.  Improved anemia.  Dr. Elberta Fortis recommends increasing the dose of amiodarone but her GFR is now worse. I am going to reach out to her nephrologist.   Work her in with Dr. Bing Matter next week or one of the other Mds.

## 2023-01-14 NOTE — Telephone Encounter (Signed)
Results reviewed with pt as per Wallis Bamberg NP's note.  Pt verbalized understanding and had no additional questions. Routed to PCP.

## 2023-01-18 ENCOUNTER — Telehealth: Payer: Self-pay | Admitting: Gastroenterology

## 2023-01-18 ENCOUNTER — Telehealth: Payer: Self-pay

## 2023-01-18 ENCOUNTER — Ambulatory Visit (INDEPENDENT_AMBULATORY_CARE_PROVIDER_SITE_OTHER): Payer: Medicare Other

## 2023-01-18 ENCOUNTER — Other Ambulatory Visit (INDEPENDENT_AMBULATORY_CARE_PROVIDER_SITE_OTHER): Payer: Medicare Other

## 2023-01-18 ENCOUNTER — Ambulatory Visit: Payer: Medicare Other | Admitting: Gastroenterology

## 2023-01-18 ENCOUNTER — Encounter: Payer: Self-pay | Admitting: Gastroenterology

## 2023-01-18 VITALS — BP 120/68 | HR 80 | Ht 63.0 in | Wt 168.0 lb

## 2023-01-18 DIAGNOSIS — R195 Other fecal abnormalities: Secondary | ICD-10-CM | POA: Diagnosis not present

## 2023-01-18 DIAGNOSIS — I442 Atrioventricular block, complete: Secondary | ICD-10-CM | POA: Diagnosis not present

## 2023-01-18 DIAGNOSIS — R932 Abnormal findings on diagnostic imaging of liver and biliary tract: Secondary | ICD-10-CM

## 2023-01-18 LAB — CUP PACEART REMOTE DEVICE CHECK
Battery Remaining Longevity: 135 mo
Battery Voltage: 3.01 V
Brady Statistic AP VP Percent: 0.05 %
Brady Statistic AP VS Percent: 93.03 %
Brady Statistic AS VP Percent: 0.01 %
Brady Statistic AS VS Percent: 6.89 %
Brady Statistic RA Percent Paced: 70.13 %
Brady Statistic RV Percent Paced: 13.06 %
Date Time Interrogation Session: 20241215230720
Implantable Lead Connection Status: 753985
Implantable Lead Connection Status: 753985
Implantable Lead Implant Date: 20210322
Implantable Lead Implant Date: 20210322
Implantable Lead Location: 753859
Implantable Lead Location: 753860
Implantable Lead Model: 5076
Implantable Lead Model: 5076
Implantable Pulse Generator Implant Date: 20210322
Lead Channel Impedance Value: 285 Ohm
Lead Channel Impedance Value: 304 Ohm
Lead Channel Impedance Value: 342 Ohm
Lead Channel Impedance Value: 399 Ohm
Lead Channel Pacing Threshold Amplitude: 0.625 V
Lead Channel Pacing Threshold Amplitude: 1 V
Lead Channel Pacing Threshold Pulse Width: 0.4 ms
Lead Channel Pacing Threshold Pulse Width: 0.4 ms
Lead Channel Sensing Intrinsic Amplitude: 4.5 mV
Lead Channel Sensing Intrinsic Amplitude: 4.5 mV
Lead Channel Sensing Intrinsic Amplitude: 9 mV
Lead Channel Sensing Intrinsic Amplitude: 9 mV
Lead Channel Setting Pacing Amplitude: 1.5 V
Lead Channel Setting Pacing Amplitude: 2.5 V
Lead Channel Setting Pacing Pulse Width: 0.4 ms
Lead Channel Setting Sensing Sensitivity: 2 mV
Zone Setting Status: 755011
Zone Setting Status: 755011

## 2023-01-18 LAB — CBC WITH DIFFERENTIAL/PLATELET
Basophils Absolute: 0 10*3/uL (ref 0.0–0.1)
Basophils Relative: 0.9 % (ref 0.0–3.0)
Eosinophils Absolute: 0.3 10*3/uL (ref 0.0–0.7)
Eosinophils Relative: 5.8 % — ABNORMAL HIGH (ref 0.0–5.0)
HCT: 31 % — ABNORMAL LOW (ref 36.0–46.0)
Hemoglobin: 9.8 g/dL — ABNORMAL LOW (ref 12.0–15.0)
Lymphocytes Relative: 13.8 % (ref 12.0–46.0)
Lymphs Abs: 0.7 10*3/uL (ref 0.7–4.0)
MCHC: 31.6 g/dL (ref 30.0–36.0)
MCV: 88.3 fL (ref 78.0–100.0)
Monocytes Absolute: 0.6 10*3/uL (ref 0.1–1.0)
Monocytes Relative: 11.6 % (ref 3.0–12.0)
Neutro Abs: 3.6 10*3/uL (ref 1.4–7.7)
Neutrophils Relative %: 67.9 % (ref 43.0–77.0)
Platelets: 187 10*3/uL (ref 150.0–400.0)
RBC: 3.51 Mil/uL — ABNORMAL LOW (ref 3.87–5.11)
RDW: 20.2 % — ABNORMAL HIGH (ref 11.5–15.5)
WBC: 5.3 10*3/uL (ref 4.0–10.5)

## 2023-01-18 LAB — COMPREHENSIVE METABOLIC PANEL
ALT: 27 U/L (ref 0–35)
AST: 26 U/L (ref 0–37)
Albumin: 4.2 g/dL (ref 3.5–5.2)
Alkaline Phosphatase: 91 U/L (ref 39–117)
BUN: 71 mg/dL — ABNORMAL HIGH (ref 6–23)
CO2: 29 meq/L (ref 19–32)
Calcium: 11 mg/dL — ABNORMAL HIGH (ref 8.4–10.5)
Chloride: 103 meq/L (ref 96–112)
Creatinine, Ser: 3.64 mg/dL — ABNORMAL HIGH (ref 0.40–1.20)
GFR: 11.17 mL/min — CL (ref >60.00)
Glucose, Bld: 214 mg/dL — ABNORMAL HIGH (ref 70–99)
Potassium: 4.2 meq/L (ref 3.5–5.1)
Sodium: 141 meq/L (ref 135–145)
Total Bilirubin: 0.4 mg/dL (ref 0.2–1.2)
Total Protein: 7 g/dL (ref 6.0–8.3)

## 2023-01-18 LAB — PROTIME-INR
INR: 1 {ratio} (ref 0.8–1.0)
Prothrombin Time: 10.5 s (ref 9.6–13.1)

## 2023-01-18 NOTE — Progress Notes (Signed)
I called pt regarding Cr 3.64 (GFR 11 ml/min) Hold torsemide tonight and tomorrow, then start 20 mg p.o. daily from Wednesday   Brooke Please call Dr. Lisette Abu (nephrology) for FU appointment as soon as possible   Send report to family physician

## 2023-01-18 NOTE — Telephone Encounter (Signed)
Patients daughter inquiring if patients apt today at 51 am can be tele visit instead. Please advise.   Thank you

## 2023-01-18 NOTE — Telephone Encounter (Signed)
Received critical lab result from lab downstairs.  GFR low at 11.17

## 2023-01-18 NOTE — Telephone Encounter (Signed)
Patient came in the office.  

## 2023-01-18 NOTE — Telephone Encounter (Signed)
PT is calling to ask if her appointment can be be a televisit. Please advise.

## 2023-01-18 NOTE — Progress Notes (Unsigned)
08/26/2022 NITIKA WANNINGER [id number] 24-Sep-1940     CHIEF COMPLAINT: Stool test showed blood in stool   HISTORY OF PRESENT ILLNESS: Gennesis Ciotti is an 82 year old female with a past medical history of hypertension, hyperlipidemia, coronary artery disease s/p STEMI/cardiogenic shock and RCA DES 08/2016, CHF, ischemic cardiomyopathy, atrial fibrillation s/p watchmans procedure 2018 off Eliquis, moderate to severe MR, syncope s/p dual-chamber pacemaker 04/2019, CVA 10/2016, DM type II, CKD stage III/IV, chronic anemia, OSA (does not use CPAP), GERD and ischemic colitis. She is known by Dr. Chales Abrahams.    She presents to our office today as recommended by her cardiologist Dr. Bing Matter evaluation regarding a recent positive FOBT in setting of chronic anemia and TEE ECHO showed 2 clots in the right atrium. Anticoagulation not initiated, awaiting GI recommendations. She denies having any nausea or vomiting. No heartburn. She has intermittent dysphagia, food briefly gets stuck to the upper esophagus once or twice weekly x 6 months. Last episode occurred one week ago, she ate potatoes which briefly got stuck in her upper esophagus and passed after she drank a few sips of water. No upper abdominal pain. She remains on Pantoprazole 40 mg daily and Famotidine 40 mg at bedtime. She has mild RLQ discomfort which occurs prior to passing a BM and abates after defecation completed. She is passing a normal dark brown formed stool daily.  Stools are darker since she previously started Ferrous Sulfate daily.  No rectal bleeding. She is no longer on Eliquis. However, she has recurrent A-fib with RVR and was Amiodarone dose was recently increased.  She underwent a TEE  08/12/2018 (prior to consideration for cardioversion) which identified 2 highly mobile thrombus in the right atrium, moderate to severe MR and LVEF 55 to 60%.   She has a significant cardiac history including STEMI with cardiogenic shock s/p DES to the RCA  08/2016.  She also had a GI bleed during this hospitalization with profound anemia.  Admission hemoglobin level 5.9 with positive FOBT on Xarelto for A-fib.  She received 4 units of PRBCs. She underwent  an EGD 08/12/2016 by Dr. Rocky Crafts which showed a small hiatal hernia, mild gastritis. Colonoscopy 08/12/2016 showed moderate pancolonic diverticulosis, small ascending colonic tubular adenoma, ischemic colitis and mod internal hemorrhoids.  Etiology for her GI bleed was not identified.  Hemoglobin was 8.3 at time of discharge.   She underwent a small bowel capsule endoscopy 10/28/2017 which showed a small gastric polyp, poorly visualized lesion in the proximal small bowel with erythema, friability 35 minutes and small erosions at 1 hour and 15 minutes.  She underwent an EGD by Dr. Chales Abrahams 12/03/2017 which showed a small hiatal hernia otherwise was normal.   She has CKD stage III/IV followed by nephrologist at [facility].  She last saw her nephrologist Dr. Lequita Halt on 08/04/2022.  At that time, her creatinine level was 2.1 with a GFR 23.  Hemoglobin downward at 9.2 despite oral iron supplementation.  She received EPO 20,000 units x 1.  She received IV iron infusions in the past, not recently.         Latest Ref Rng & Units 08/18/2022   10:55 AM 07/30/2022    1:23 PM 07/22/2022   11:49 AM  CBC  WBC 3.4 - 10.8 x10E3/uL 4.3  6.2  6.9   Hemoglobin 11.1 - 15.9 g/dL 9.8  9.2  9.1   Hematocrit 34.0 - 46.6 % 32.4  29.4  29.3  Platelets 150 - 450 x10E3/uL 234  326  272           Latest Ref Rng & Units 07/30/2022    1:23 PM 07/22/2022   11:49 AM 06/01/2022    4:18 PM  CMP  Glucose 70 - 99 mg/dL 409  811     BUN 8 - 27 mg/dL 45  38     Creatinine 9.14 - 1.00 mg/dL 7.82  9.56     Sodium 213 - 144 mmol/L 142  145     Potassium 3.5 - 5.2 mmol/L 4.7  4.3     Chloride 96 - 106 mmol/L 105  111     CO2 20 - 29 mmol/L 23  19     Calcium 8.7 - 10.3 mg/dL 08.6  57.8     Total Protein 6.0 - 8.5 g/dL 6.5    6.1    Total Bilirubin 0.0 - 1.2 mg/dL <4.6    0.3   Alkaline Phos 44 - 121 IU/L 88    86   AST 0 - 40 IU/L 15    18   ALT 0 - 32 IU/L 17    13     ECHO 08/12/2022: IMPRESSIONS Left ventricular ejection fraction, by estimation, is 55 to 60%. The left ventricle has normal function. Left ventricular diastolic function could not be evaluated. 1. 2. Right ventricular systolic function is normal. The right ventricular size is normal. Watchman device well seated in the left atrial appendage. No clots. No noted residual flow/leak noted on color doppler. No left atrial/left atrial appendage thrombus was detected. 3. Device lead noted in the right atrium. There are two highly mobile echogenic material off the device lead consistent with a clot/thrombus. 4. The mitral valve is abnormal. Moderate to severe mitral valve regurgitation. No evidence of mitral stenosis. 5. 6. The aortic valve is calcified. Aortic valve regurgitation is not visualized.   EGD 08/12/2016 by Dr. Rocky Crafts done during hospitalization with STEMI and GI bleed: Small hiatal hernia, mild gastritis.     Colonoscopy 08/12/2016 done during hospitalization with STEMI and GI bleed: Moderate pancolonic diverticulosis, small ascending colonic tubular adenoma, ischemic colitis and mod internal hemorrhoids. .   EGD 12/03/2017 by Dr. Chales Abrahams: - Small hiatal hernia.  - Normal examined duodenum.  - No specimens collected.   Small bowel capsule endoscopy 10/28/2017: Complete capsule study, fairly good prep Small gastric polyp Poorly visualized lesion in proximal small bowel with erythema, friability at 35 minutes  Small erosions at 1 hour 50 minutes   11/21/2014 by Dr. Leona Singleton: Moderate predominantly sigmoid diverticulosis Otherwise normal colonoscopy to terminal ileum       Past Medical History:  Diagnosis Date   Acute ischemic stroke (HCC)     Acute metabolic encephalopathy     Acute on chronic systolic (congestive) heart failure (HCC)     Acute renal  insufficiency 09/02/2016   Anemia due to stage 4 chronic kidney disease (HCC) 04/04/2015   Atrial fibrillation (HCC)     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) 04/04/2015   Bradycardia 09/17/2014   Chest pain 06/23/2016    Overview:  Added automatically from request for surgery [id number]   Chronic anemia     Chronic kidney disease (CKD), stage III (moderate) (HCC)     Coronary artery disease 10/12/2016    Non drug-eluting stent implanted in June 2018 to mid RCA   08/08/2016 10:37  Angiographic Findings  Cardiac Arteries and Lesion Findings  LMCA: 0% and Normal. LAD: 0% and Normal. RCA:   Lesion on Mid RCA: Mid subsection.95% stenosis reduced to 0%. Pre procedure   TIMI II flow was noted. Post Procedure TIMI III flow was present. Poor run   off was present. The lesion was diagnosed as High Risk (C).    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) 04/04/2015   Diabetes mellitus without complication (HCC)     Diverticulosis     Dyslipidemia 09/17/2014   Gastroesophageal reflux     GI bleed 08/06/2016   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   History of cardiac monitoring 02/23/2018    monitor inserted   History of iron deficiency anemia 12/28/2017   Hyperlipidemia     Hypertensive heart disease with heart failure (HCC)     Iron deficiency anemia 12/28/2017   LV dysfunction 09/02/2016   Myocardial infarction [facility])     NSTEMI (non-ST elevated myocardial infarction) (HCC) 08/06/2016   Obstructive sleep apnea 09/17/2014   Orthostatic hypotension 12/28/2017   OSA on CPAP     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   Postural dizziness with presyncope 09/23/2017   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/24/2017   Recurrent left pleural effusion 03/14/2015   Renal failure, chronic, stage 3 (moderate) (HCC)     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   Syncope 11/04/2017   Systolic heart failure (HCC)     Thyroid  disease     Vitamin D deficiency 04/04/2015             Past Surgical History:  Procedure Laterality Date   BUBBLE STUDY   05/16/2020    Procedure: BUBBLE STUDY;  Surgeon: Parke Poisson, MD;  Location: [facility] ENDOSCOPY;  Service: Cardiovascular;;   CARDIAC CATHETERIZATION       CARDIOVERSION N/A 05/16/2020    Procedure: CARDIOVERSION;  Surgeon: Parke Poisson, MD;  Location: [facility] ENDOSCOPY;  Service: Cardiovascular;  Laterality: N/A;   CARDIOVERSION N/A 09/06/2020    Procedure: CARDIOVERSION;  Surgeon: Little Ishikawa, MD;  Location: [facility] ENDOSCOPY;  Service: Cardiovascular;  Laterality: N/A;   CARDIOVERSION N/A 05/16/2021    Procedure: CARDIOVERSION;  Surgeon: Quintella Reichert, MD;  Location: [facility] ENDOSCOPY;  Service: Cardiovascular;  Laterality: N/A;   CARDIOVERSION N/A 08/12/2022    Procedure: CARDIOVERSION;  Surgeon: Thomasene Ripple, DO;  Location: [facility] INVASIVE CV LAB;  Service: Cardiovascular;  Laterality: N/A;   COLONOSCOPY   11/21/2014    Moderate predominantly sigmoid diverticulosis. Otherwise noraml collonscopy to TI.    CORONARY ANGIOPLASTY WITH STENT PLACEMENT   08/2016   EXCISIONAL HEMORRHOIDECTOMY       LEFT ATRIAL APPENDAGE OCCLUSION   11/2016    in [place]   LOOP RECORDER INSERTION N/A 02/23/2018    Procedure: LOOP RECORDER INSERTION;  Surgeon: Regan Lemming, MD;  Location: [facility] INVASIVE CV LAB;  Service: Cardiovascular;  Laterality: N/A;   LOOP RECORDER REMOVAL N/A 04/24/2019    Procedure: LOOP RECORDER REMOVAL;  Surgeon: Regan Lemming, MD;  Location: [facility] INVASIVE CV LAB;  Service: Cardiovascular;  Laterality: N/A;   NOSE SURGERY       PACEMAKER IMPLANT N/A 04/24/2019    Procedure: PACEMAKER IMPLANT;  Surgeon: Regan Lemming, MD;  Location: [facility] INVASIVE CV LAB;  Service: Cardiovascular;  Laterality: N/A;   TEE WITHOUT CARDIOVERSION N/A 05/16/2020    Procedure: TRANSESOPHAGEAL ECHOCARDIOGRAM (TEE);  Surgeon: Parke Poisson, MD;  Location: [facility] ENDOSCOPY;   Service: Cardiovascular;  Laterality: N/A;   TEE WITHOUT CARDIOVERSION N/A 08/12/2022    Procedure: TRANSESOPHAGEAL ECHOCARDIOGRAM;  Surgeon: Thomasene Ripple, DO;  Location: [facility] INVASIVE CV LAB;  Service: Cardiovascular;  Laterality: N/A;   TOTAL HIP REVISION Left 03/25/2021    Procedure: LEFT POSTERIOR TOTAL HIP  ZIMMER CABLES;  Surgeon: Durene Romans, MD;  Location: [facility] ORS;  Service: Orthopedics;  Laterality: Left;        Social History: She is married.  She has 2 daughters.  Retired.  Past smoker, quit smoking cigarettes 27 years ago.  She drinks 2 alcoholic beverages weekly.  No drug use.   Family History: family history includes Breast cancer in her mother; Cancer in her maternal grandfather; Diabetes in her sister; Heart attack in her brother, maternal uncle, and sister; Hypertension in her father; Prostate cancer in her father; Stroke in her father.No known family history of esophageal, gastric or colon cancer     Allergies       Allergies  Allergen Reactions   Ciprofloxacin Other (See Comments)      Achilles tendon pain   Contrast Media [Iodinated Contrast Media]        Avoid due to stage 4 kidney disease    Crestor [Rosuvastatin]        Joint pain   Lipitor [Atorvastatin]        Joint pain   Nsaids        Avoid due to stage 4 kidney disease    Statins Other (See Comments)      Joint pain, tolerates pravastatin             Outpatient Encounter Medications as of 08/26/2022  Medication Sig   albuterol (VENTOLIN HFA) 108 (90 Base) MCG/ACT inhaler Inhale 2 puffs into the lungs every 6 (six) hours as needed for wheezing or shortness of breath.   amiodarone (PACERONE) 200 MG tablet Take 1 tablet (200 mg total) by mouth 2 (two) times daily.   amLODipine (NORVASC) 5 MG tablet TAKE 1 TABLET BY MOUTH DAILY   BIOTIN PO Take 1,000 mcg by mouth daily.   blood glucose meter kit and supplies KIT Dispense based on patient and insurance preference. Use up to four times daily as directed.  (Patient taking differently: Inject 1 each into the skin as directed. Dispense based on patient and insurance preference. Use up to four times daily as directed.)   carboxymethylcellul-glycerin (REFRESH RELIEVA) 0.5-0.9 % ophthalmic solution Place 1 drop into both eyes daily as needed for dry eyes. VE   Cholecalciferol (VITAMIN D3) 50 MCG (2000 UT) TABS Take 2,000 Units by mouth daily at 12 noon.   Coenzyme Q10 100 MG capsule Take 100 mg by mouth 2 (two) times daily.   colchicine 0.6 MG tablet Take 0.6 mg by mouth daily as needed (gout). For gout flare ups   denosumab (PROLIA) 60 MG/ML SOSY injection Inject 60 mg into the skin every 6 (six) months.   docusate sodium (COLACE) 100 MG capsule Take 100 mg by mouth daily.   estradiol (ESTRACE) 0.1 MG/GM vaginal cream Place 1 Applicatorful vaginally every Monday, Wednesday, and Friday.    Evolocumab (REPATHA SURECLICK) 140 MG/ML SOAJ Inject 140 mg into the skin every 14 (fourteen) days.   famotidine (PEPCID) 40 MG tablet Take 40 mg by mouth every evening.   febuxostat (ULORIC) 40 MG tablet Take 40 mg by mouth at bedtime.   feeding supplement, GLUCERNA SHAKE, (GLUCERNA SHAKE) LIQD Take 237 mLs by mouth 3 (three) times daily between meals. (  Patient taking differently: Take 237 mLs by mouth 2 (two) times daily between meals.)   ferrous sulfate 325 (65 FE) MG EC tablet Take 325 mg by mouth 2 (two) times daily with breakfast and lunch.   folic acid (FOLVITE) 800 MCG tablet Take 800 mcg by mouth daily at 12 noon.   Glucosamine-Chondroitin (OSTEO BI-FLEX REGULAR STRENGTH PO) Take 25 mcg by mouth daily at 12 noon. Plus D3   hydrALAZINE (APRESOLINE) 50 MG tablet Take 1 tablet (50 mg total) by mouth every 6 (six) hours.   isosorbide mononitrate (IMDUR) 60 MG 24 hr tablet Take 60 mg by mouth daily.   Lactobacillus (FLORAJEN ACIDOPHILUS) CAPS Take 1 capsule by mouth daily at 12 noon. 390 mg each   levothyroxine (SYNTHROID, LEVOTHROID) 50 MCG tablet Take 50 mcg by  mouth daily before breakfast.   Multiple Vitamins-Minerals (PRESERVISION AREDS 2) CAPS Take 1 capsule by mouth 2 (two) times daily.   nitroGLYCERIN (NITROSTAT) 0.4 MG SL tablet Place 0.4 mg under the tongue every 5 (five) minutes as needed for chest pain.   omega-3 acid ethyl esters (LOVAZA) 1 g capsule Take 2 capsules (2 g total) by mouth daily.   pantoprazole (PROTONIX) 40 MG tablet Take 1 tablet (40 mg total) by mouth daily.   polyethylene glycol powder (GLYCOLAX/MIRALAX) powder Take 17 g by mouth every other day. Alternating between miralax and benefiber   pravastatin (PRAVACHOL) 40 MG tablet Take 40 mg by mouth at bedtime.   ranolazine (RANEXA) 500 MG 12 hr tablet Take 1 tablet (500 mg total) by mouth 2 (two) times daily.   torsemide (DEMADEX) 10 MG tablet Take 1 tablet (10 mg total) by mouth 2 (two) times daily.   vitamin B-12 (CYANOCOBALAMIN) 1000 MCG tablet Take 1,000 mcg by mouth daily at 12 noon.   Wheat Dextrin (BENEFIBER DRINK MIX PO) Take 1 Scoop by mouth every other day. Mix 1 capful with beverage and drink every other day      No facility-administered encounter medications on file as of 08/26/2022.        REVIEW OF SYSTEMS:  Gen: Denies fever, sweats or chills. No weight loss.  CV: Denies chest pain, palpitations or edema. Resp: Denies cough, shortness of breath of hemoptysis.  GI:See HPI. GU : Denies urinary burning, blood in urine, increased urinary frequency or incontinence. MS: Denies joint pain, muscles aches or weakness. Derm: Denies rash, itchiness, skin lesions or unhealing ulcers. Psych: Denies depression, anxiety, memory loss or confusion. Heme: Denies bruising, easy bleeding. Neuro:  Denies headaches, dizziness or paresthesias. Endo:  + DM type II.   PHYSICAL EXAM: BP (!) 148/72   Pulse 66   Ht 5\' 3"  (1.6 m)   Wt 182 lb (82.6 kg)   SpO2 99%   BMI 32.24 kg/m    General: 82 year old female in no acute distress. Head: Normocephalic and atraumatic. Eyes:   Sclerae non-icteric, conjunctive pink. Ears: Normal auditory acuity. Mouth: Dentition intact. No ulcers or lesions.  Neck: Supple, no lymphadenopathy or thyromegaly.  Lungs: Clear bilaterally to auscultation without wheezes, crackles or rhonchi. Heart: Regular rate and rhythm. No murmur, rub or gallop appreciated.  Abdomen: Soft, nontender, nondistended. No masses. No hepatosplenomegaly. Normoactive bowel sounds x 4 quadrants.  Rectal: Deferred.  Musculoskeletal: Symmetrical with no gross deformities. Skin: Warm and dry. No rash or lesions on visible extremities. Extremities: Bilateral LE edema, LLE > RLE with negative Homan's sign. Patient  Neurological: Alert oriented x 4, no focal deficits.  Psychological:  Alert and cooperative. Normal mood and affect.   ASSESSMENT AND PLAN:   82 year old female with chronic iron anemia with + FOBT. Hg 9.8. No obvious rectal bleeding or melena. History of GI 08/2016.  EGD 08/12/2016 showed a small hiatal hernia, mild gastritis. Colonoscopy 08/12/2016 showed moderate pancolonic diverticulosis, small ascending colonic tubular adenoma, ischemic colitis and mod internal hemorrhoids. Small bowel capsule endoscopy 10/28/2017 showed a small gastric polyp, poorly visualized lesion in the proximal small bowel with erythema, friability 35 minutes and small erosions at 1 hour and 15 minutes. She EGD 12/03/2017 which showed a small hiatal hernia otherwise was normal.  -Patient is very high risk for sedation/procedure complications. Endoscopic evaluation deferred for now.  -CTAP with oral contrast only asap -Await repeat H/H results  -Case reviewed by Dr. Chales Abrahams, ok for cardiology to start anticoagulation for right atrial thrombus  -If patient develops active GI bleeding, patient to present to [facility] ED for admission   CKD stage III/IV contributing to anemia. Received EPO x 1 per nephrology.    History of GERD. Dysphagia x 6 months. -Barium swallow study, to  be scheduled one week after CTAP completed  -Endoscopic evaluation deferred for now    Atrial fibrillation, not on anticoagulation.    TEE showed 2 clots in the right atrium.  -Patient to follow up with cardiology -See GI recommendations and noted above    Bilateral LE edema, LLE > RLE. Patient endorsed falling/injuring her left knee 3 months ago, since then LLE edema > RLE -Follow up with cardiologist, recommend LLE doppler to rule out DVT   History of GI a tubular adenomatous polyp removed from the colon per colonoscopy 08/2016   History of ischemic colitis per colonoscopy 08/2016       CC:  Street, Stephanie Coup, *     Attending physician's note   I have taken history, reviewed the chart and examined the patient. I performed a substantive portion of this encounter, including complete performance of at least one of the key components, in conjunction with the APP. I agree with the Advanced Practitioner's note, impression and recommendations.   No more clots right atrium after eliquis. Stopped Eliquis per cardiology  Cardioversion done but not successful Ablation schedulted scheduled for 01/18/2023 Cardiology would like to have EGD done prior for heme positive stools to rule out any peptic ulcer disease or any etiology of upper GI bleeding.  Hb 10.0 10/17//24  No NSAIDs  CTAP: 09/08/2022 IMPRESSION: 1. No acute abnormality. No evidence of bowel obstruction or acute bowel inflammation. Marked sigmoid diverticulosis, with no evidence of acute diverticulitis. 2. Moderate diffuse colonic stool, suggesting constipation. 3. Small posterior right pleural effusion. 4. Coronary atherosclerosis. 5. Small hiatal hernia. 6. Mildly diffusely dense liver parenchyma, nonspecific, correlate for history of amiodarone therapy. 7.  Aortic Atherosclerosis (ICD10-I70.0).  Her LFTs were normal.   IMP: H+ stools. Abn CT liver with normal LFTs as above (pt on amio) A-fib s/p watchman's  procedure   Plan: -EGD with dil at [facility]. Pt off eliquis ( 35month) -Continue protonix 40mg  po every day -CBC, CMP, INR,  -USE (pt on amiodarone)   Edman Circle, MD [facility] [phone]

## 2023-01-18 NOTE — Patient Instructions (Signed)
_______________________________________________________  If your blood pressure at your visit was 140/90 or greater, please contact your primary care physician to follow up on this.  _______________________________________________________  If you are age 82 or older, your body mass index should be between 23-30. Your Body mass index is 29.76 kg/m. If this is out of the aforementioned range listed, please consider follow up with your Primary Care Provider.  If you are age 20 or younger, your body mass index should be between 19-25. Your Body mass index is 29.76 kg/m. If this is out of the aformentioned range listed, please consider follow up with your Primary Care Provider.   ________________________________________________________  The [place] [facility] providers would like to encourage you to use [facility] to communicate with providers for non-urgent requests or questions.  Due to long hold times on the telephone, sending your provider a message by [facility] may be a faster and more efficient way to get a response.  Please allow 48 business hours for a response.  Please remember that this is for non-urgent requests.  _______________________________________________________  Continue Protonix daily  Your provider has requested that you go to the basement level for lab work before leaving today. Press "B" on the elevator. The lab is located at the first door on the left as you exit the elevator.  You have been scheduled for an abdominal ultrasound at [facility] (1st floor of hospital) on 01-22-2023 at 9am. Please arrive 30 minutes prior to your appointment for registration. Make certain not to have anything to eat or drink midnight prior to your appointment. Should you need to reschedule your appointment, please contact radiology at [phone]. This test typically takes about 30 minutes to perform.   Stop iron tablet one week prior to the procedure If you go back on your blood thinner please  tell us asap  You have been scheduled for an endoscopy. Please follow written instructions given to you at your visit today.  If you use inhalers (even only as needed), please bring them with you on the day of your procedure.  If you take any of the following medications, they will need to be adjusted prior to your procedure:   DO NOT TAKE 7 DAYS PRIOR TO TEST- Trulicity (dulaglutide) Ozempic, Wegovy (semaglutide) Mounjaro (tirzepatide) Bydureon Bcise (exanatide extended release)  DO NOT TAKE 1 DAY PRIOR TO YOUR TEST Rybelsus (semaglutide) Adlyxin (lixisenatide) Victoza (liraglutide) Byetta (exanatide) ___________________________________________________________________________

## 2023-01-19 DIAGNOSIS — N179 Acute kidney failure, unspecified: Secondary | ICD-10-CM | POA: Diagnosis not present

## 2023-01-19 DIAGNOSIS — D500 Iron deficiency anemia secondary to blood loss (chronic): Secondary | ICD-10-CM | POA: Diagnosis not present

## 2023-01-19 DIAGNOSIS — I4891 Unspecified atrial fibrillation: Secondary | ICD-10-CM | POA: Diagnosis not present

## 2023-01-19 DIAGNOSIS — I455 Other specified heart block: Secondary | ICD-10-CM | POA: Diagnosis not present

## 2023-01-19 DIAGNOSIS — I2510 Atherosclerotic heart disease of native coronary artery without angina pectoris: Secondary | ICD-10-CM | POA: Diagnosis not present

## 2023-01-19 DIAGNOSIS — J9601 Acute respiratory failure with hypoxia: Secondary | ICD-10-CM | POA: Diagnosis not present

## 2023-01-19 DIAGNOSIS — G4733 Obstructive sleep apnea (adult) (pediatric): Secondary | ICD-10-CM | POA: Diagnosis not present

## 2023-01-19 DIAGNOSIS — I5043 Acute on chronic combined systolic (congestive) and diastolic (congestive) heart failure: Secondary | ICD-10-CM | POA: Diagnosis not present

## 2023-01-19 DIAGNOSIS — I110 Hypertensive heart disease with heart failure: Secondary | ICD-10-CM | POA: Diagnosis not present

## 2023-01-19 DIAGNOSIS — N184 Chronic kidney disease, stage 4 (severe): Secondary | ICD-10-CM | POA: Diagnosis not present

## 2023-01-19 DIAGNOSIS — I700 Atherosclerosis of aorta: Secondary | ICD-10-CM | POA: Diagnosis not present

## 2023-01-19 DIAGNOSIS — I252 Old myocardial infarction: Secondary | ICD-10-CM | POA: Diagnosis not present

## 2023-01-19 DIAGNOSIS — I5022 Chronic systolic (congestive) heart failure: Secondary | ICD-10-CM | POA: Diagnosis not present

## 2023-01-19 DIAGNOSIS — N185 Chronic kidney disease, stage 5: Secondary | ICD-10-CM | POA: Diagnosis not present

## 2023-01-19 DIAGNOSIS — D631 Anemia in chronic kidney disease: Secondary | ICD-10-CM | POA: Diagnosis not present

## 2023-01-19 DIAGNOSIS — E1122 Type 2 diabetes mellitus with diabetic chronic kidney disease: Secondary | ICD-10-CM | POA: Diagnosis not present

## 2023-01-19 DIAGNOSIS — M810 Age-related osteoporosis without current pathological fracture: Secondary | ICD-10-CM | POA: Diagnosis not present

## 2023-01-19 DIAGNOSIS — I081 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: Secondary | ICD-10-CM | POA: Diagnosis not present

## 2023-01-20 ENCOUNTER — Ambulatory Visit (HOSPITAL_COMMUNITY): Payer: Medicare Other | Admitting: Internal Medicine

## 2023-01-20 NOTE — Telephone Encounter (Signed)
I had discussed with the patient when labs arrived She has follow-up appointment with nephrology.  She will call them to move it up RG

## 2023-01-20 NOTE — Telephone Encounter (Signed)
[facility] said she is scheduled for 01-21-2023

## 2023-01-21 ENCOUNTER — Encounter: Payer: Self-pay | Admitting: Cardiology

## 2023-01-21 ENCOUNTER — Ambulatory Visit: Payer: Medicare Other | Attending: Cardiology | Admitting: Cardiology

## 2023-01-21 VITALS — BP 120/52 | HR 90 | Ht 63.0 in | Wt 169.8 lb

## 2023-01-21 DIAGNOSIS — W19XXXA Unspecified fall, initial encounter: Secondary | ICD-10-CM | POA: Diagnosis not present

## 2023-01-21 DIAGNOSIS — Z951 Presence of aortocoronary bypass graft: Secondary | ICD-10-CM | POA: Diagnosis not present

## 2023-01-21 DIAGNOSIS — I2510 Atherosclerotic heart disease of native coronary artery without angina pectoris: Secondary | ICD-10-CM | POA: Diagnosis not present

## 2023-01-21 DIAGNOSIS — E785 Hyperlipidemia, unspecified: Secondary | ICD-10-CM

## 2023-01-21 DIAGNOSIS — I130 Hypertensive heart and chronic kidney disease with heart failure and stage 1 through stage 4 chronic kidney disease, or unspecified chronic kidney disease: Secondary | ICD-10-CM | POA: Diagnosis not present

## 2023-01-21 DIAGNOSIS — N184 Chronic kidney disease, stage 4 (severe): Secondary | ICD-10-CM

## 2023-01-21 DIAGNOSIS — I340 Nonrheumatic mitral (valve) insufficiency: Secondary | ICD-10-CM | POA: Diagnosis not present

## 2023-01-21 DIAGNOSIS — I5022 Chronic systolic (congestive) heart failure: Secondary | ICD-10-CM | POA: Diagnosis not present

## 2023-01-21 DIAGNOSIS — N289 Disorder of kidney and ureter, unspecified: Secondary | ICD-10-CM | POA: Diagnosis not present

## 2023-01-21 DIAGNOSIS — I5032 Chronic diastolic (congestive) heart failure: Secondary | ICD-10-CM | POA: Diagnosis not present

## 2023-01-21 DIAGNOSIS — Z950 Presence of cardiac pacemaker: Secondary | ICD-10-CM | POA: Diagnosis not present

## 2023-01-21 DIAGNOSIS — Z794 Long term (current) use of insulin: Secondary | ICD-10-CM | POA: Diagnosis not present

## 2023-01-21 DIAGNOSIS — E875 Hyperkalemia: Secondary | ICD-10-CM | POA: Diagnosis not present

## 2023-01-21 DIAGNOSIS — E7800 Pure hypercholesterolemia, unspecified: Secondary | ICD-10-CM | POA: Diagnosis not present

## 2023-01-21 DIAGNOSIS — R079 Chest pain, unspecified: Secondary | ICD-10-CM | POA: Diagnosis not present

## 2023-01-21 DIAGNOSIS — I4819 Other persistent atrial fibrillation: Secondary | ICD-10-CM

## 2023-01-21 DIAGNOSIS — Z955 Presence of coronary angioplasty implant and graft: Secondary | ICD-10-CM | POA: Diagnosis not present

## 2023-01-21 DIAGNOSIS — I509 Heart failure, unspecified: Secondary | ICD-10-CM | POA: Diagnosis not present

## 2023-01-21 DIAGNOSIS — I4891 Unspecified atrial fibrillation: Secondary | ICD-10-CM | POA: Diagnosis not present

## 2023-01-21 DIAGNOSIS — Z79899 Other long term (current) drug therapy: Secondary | ICD-10-CM | POA: Diagnosis not present

## 2023-01-21 DIAGNOSIS — E1122 Type 2 diabetes mellitus with diabetic chronic kidney disease: Secondary | ICD-10-CM | POA: Diagnosis not present

## 2023-01-21 DIAGNOSIS — I959 Hypotension, unspecified: Secondary | ICD-10-CM | POA: Diagnosis not present

## 2023-01-21 DIAGNOSIS — I4892 Unspecified atrial flutter: Secondary | ICD-10-CM | POA: Diagnosis not present

## 2023-01-21 NOTE — Progress Notes (Signed)
Cardiology Office Note:    Date:  01/21/2023   ID:  Amanda [name], DOB 18-Aug-1940, MRN [id number]  PCP:  Street, Stephanie Coup, MD  Cardiologist:  Gypsy Balsam, MD    Referring MD: [address], Stephanie Coup, *   No chief complaint on file.   History of Present Illness:    Amanda [name] is a 82 y.o. female very complex past medical history paroxysmal atrial fibrillation, status post Watchman device, chronic kidney failure with latest GFR only 11, coronary artery disease, complete heart block status post pacemaker, obstructive sleep apnea on CPAP, diabetes, dyslipidemia.  Recently I did see her in the hospital she was that because of decompensated CHF, while in the hospital she converted with amiodarone to sinus rhythm discharged home hospitalization was complicated because with pneumonia.  Eventually did quite well.  She comes here to follow-up interestingly while in the office to complain of any chest pain going to her jaw.  EKG did not show any acute changes however she is grating sensation 4-5 scale up to 10.  She said this is something new she did not have it before.  Past Medical History:  Diagnosis Date   Acute ischemic stroke (HCC)    Acute metabolic encephalopathy    Acute on chronic systolic (congestive) heart failure (HCC)    Acute renal insufficiency 09/02/2016   Anemia due to stage 4 chronic kidney disease (HCC) 04/04/2015   Atrial fibrillation (HCC)    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) 04/04/2015   Bradycardia 09/17/2014   Chest pain 06/23/2016   Overview:  Added automatically from request for surgery [id number]   Chronic anemia    Chronic kidney disease (CKD), stage III (moderate) (HCC)    Coronary artery disease 10/12/2016   Non drug-eluting stent implanted in June 2018 to mid RCA   08/08/2016 10:37  Angiographic Findings  Cardiac Arteries and Lesion Findings LMCA: 0% and Normal. LAD: 0% and Normal. RCA:   Lesion on Mid RCA: Mid  subsection.95% stenosis reduced to 0%. Pre procedure   TIMI II flow was noted. Post Procedure TIMI III flow was present. Poor run   off was present. The lesion was diagnosed as High Risk (C).    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   History of cardiac monitoring 02/23/2018   monitor inserted   History of iron deficiency anemia 12/28/2017   History of transesophageal echocardiography (TEE)    Hyperlipidemia    Hypertensive heart disease with heart failure (HCC)    Iron deficiency anemia 12/28/2017   LV dysfunction 09/02/2016   Myocardial infarction [facility])    NSTEMI (non-ST elevated myocardial infarction) (HCC) 08/06/2016   Obstructive sleep apnea 09/17/2014   Orthostatic hypotension 12/28/2017   OSA on CPAP    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   Postural dizziness with presyncope 09/23/2017   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/24/2017   Recurrent left pleural effusion 03/14/2015   Renal failure, chronic, stage 3 (moderate) (HCC)    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   Syncope 11/04/2017   Systolic heart failure (HCC)    Thyroid disease    Vitamin D deficiency 04/04/2015    Past Surgical History:  Procedure Laterality Date   BUBBLE STUDY  05/16/2020   Procedure: BUBBLE STUDY;  Surgeon: Parke Poisson, MD;  Location: [facility] ENDOSCOPY;  Service: Cardiovascular;;   CARDIAC CATHETERIZATION     CARDIOVERSION N/A 05/16/2020   Procedure: CARDIOVERSION;  Surgeon: Parke Poisson, MD;  Location: [facility] ENDOSCOPY;  Service: Cardiovascular;  Laterality: N/A;   CARDIOVERSION N/A 09/06/2020   Procedure: CARDIOVERSION;  Surgeon: Little Ishikawa, MD;  Location: [facility] ENDOSCOPY;  Service: Cardiovascular;  Laterality: N/A;    CARDIOVERSION N/A 05/16/2021   Procedure: CARDIOVERSION;  Surgeon: Quintella Reichert, MD;  Location: [facility] ENDOSCOPY;  Service: Cardiovascular;  Laterality: N/A;   CARDIOVERSION N/A 08/12/2022   Procedure: CARDIOVERSION;  Surgeon: Thomasene Ripple, DO;  Location: [facility] INVASIVE CV LAB;  Service: Cardiovascular;  Laterality: N/A;   CARDIOVERSION N/A 09/30/2022   Procedure: CARDIOVERSION;  Surgeon: Jodelle Red, MD;  Location: [facility] INVASIVE CV LAB;  Service: Cardiovascular;  Laterality: N/A;   CARDIOVERSION N/A 12/08/2022   Procedure: CARDIOVERSION (CATH LAB);  Surgeon: Christell Constant, MD;  Location: [facility] INVASIVE CV LAB;  Service: Cardiovascular;  Laterality: N/A;   CARDIOVERSION N/A 12/14/2022   Procedure: CARDIOVERSION (CATH LAB);  Surgeon: Jodelle Red, MD;  Location: [facility] INVASIVE CV LAB;  Service: Cardiovascular;  Laterality: N/A;   COLONOSCOPY  11/21/2014   Moderate predominantly sigmoid diverticulosis. Otherwise noraml collonscopy to TI.    CORONARY ANGIOPLASTY WITH STENT PLACEMENT  08/2016   EXCISIONAL HEMORRHOIDECTOMY     LEFT ATRIAL APPENDAGE OCCLUSION  11/2016   in [place]   LOOP RECORDER INSERTION N/A 02/23/2018   Procedure: LOOP RECORDER INSERTION;  Surgeon: Regan Lemming, MD;  Location: [facility] INVASIVE CV LAB;  Service: Cardiovascular;  Laterality: N/A;   LOOP RECORDER REMOVAL N/A 04/24/2019   Procedure: LOOP RECORDER REMOVAL;  Surgeon: Regan Lemming, MD;  Location: [facility] INVASIVE CV LAB;  Service: Cardiovascular;  Laterality: N/A;   NOSE SURGERY     PACEMAKER IMPLANT N/A 04/24/2019   Procedure: PACEMAKER IMPLANT;  Surgeon: Regan Lemming, MD;  Location: [facility] INVASIVE CV LAB;  Service: Cardiovascular;  Laterality: N/A;   TEE WITH CARDIOVERSION     TEE WITHOUT CARDIOVERSION N/A 05/16/2020   Procedure: TRANSESOPHAGEAL ECHOCARDIOGRAM (TEE);  Surgeon: Parke Poisson, MD;  Location: [facility] ENDOSCOPY;  Service: Cardiovascular;  Laterality: N/A;   TEE WITHOUT  CARDIOVERSION N/A 08/12/2022   Procedure: TRANSESOPHAGEAL ECHOCARDIOGRAM;  Surgeon: Thomasene Ripple, DO;  Location: [facility] INVASIVE CV LAB;  Service: Cardiovascular;  Laterality: N/A;   TEE WITHOUT CARDIOVERSION N/A 09/30/2022   Procedure: TRANSESOPHAGEAL ECHOCARDIOGRAM;  Surgeon: Jodelle Red, MD;  Location: [facility] INVASIVE CV LAB;  Service: Cardiovascular;  Laterality: N/A;   TOTAL HIP REVISION Left 03/25/2021   Procedure: LEFT POSTERIOR TOTAL HIP  ZIMMER CABLES;  Surgeon: Durene Romans, MD;  Location: [facility] ORS;  Service: Orthopedics;  Laterality: Left;    Current Medications: Current Meds  Medication Sig   amiodarone (PACERONE) 200 MG tablet Take 200 mg by mouth 2 (two) times daily.   amLODipine (NORVASC) 10 MG tablet Take by mouth daily.   Biotin 1000 MCG tablet Take 1,000 mcg by mouth daily.   blood glucose meter kit and supplies KIT Dispense based on patient and insurance preference. Use up to four times daily as directed. (Patient taking differently: Inject 1 each into the skin as directed. Dispense based on patient and insurance preference. Patient does this twice a day.)   carboxymethylcellul-glycerin (REFRESH RELIEVA) 0.5-0.9 % ophthalmic solution Place 1 drop into both eyes 4 (four) times daily as needed for dry eyes.  VE   Cholecalciferol (VITAMIN D3) 50 MCG (2000 UT) TABS Take 2,000 Units by mouth daily at 12 noon.   cloNIDine (CATAPRES) 0.1 MG tablet Take 0.1 mg by mouth 2 (two) times daily.   Coenzyme Q10 100 MG capsule Take 100 mg by mouth 2 (two) times daily.   estradiol (ESTRACE) 0.1 MG/GM vaginal cream Place 1 Applicatorful vaginally every Monday, Wednesday, and Friday.    Evolocumab (REPATHA SURECLICK) 140 MG/ML SOAJ Inject 140 mg into the skin every 14 (fourteen) days.   ferrous sulfate 325 (65 FE) MG EC tablet Take 325 mg by mouth 2 (two) times daily with breakfast and lunch.   folic acid (FOLVITE) 800 MCG tablet Take 800 mcg by mouth daily at 12 noon.    Glucosamine-Chondroitin (OSTEO BI-FLEX REGULAR STRENGTH PO) Take 25 mcg by mouth daily at 12 noon. Plus D3   HUMALOG KWIKPEN 100 UNIT/ML KwikPen Inject 2-6 Units into the skin as directed. Per sliding scale   hydrALAZINE (APRESOLINE) 50 MG tablet Take 1 tablet (50 mg total) by mouth every 6 (six) hours.   isosorbide mononitrate (IMDUR) 60 MG 24 hr tablet Take 180 mg by mouth daily.   Lactobacillus (FLORAJEN ACIDOPHILUS) CAPS Take 1 capsule by mouth daily at 12 noon. 390 mg each   levothyroxine (SYNTHROID, LEVOTHROID) 50 MCG tablet Take 50 mcg by mouth daily before breakfast.   Multiple Vitamins-Minerals (PRESERVISION AREDS 2) CAPS Take 1 capsule by mouth 2 (two) times daily.   nitroGLYCERIN (NITROSTAT) 0.4 MG SL tablet Place 0.4 mg under the tongue every 5 (five) minutes as needed for chest pain.   omega-3 acid ethyl esters (LOVAZA) 1 g capsule Take 2 capsules (2 g total) by mouth daily.   pantoprazole (PROTONIX) 40 MG tablet Take 1 tablet (40 mg total) by mouth daily.   pravastatin (PRAVACHOL) 40 MG tablet Take 40 mg by mouth at bedtime.   ranolazine (RANEXA) 500 MG 12 hr tablet Take 1 tablet (500 mg total) by mouth 2 (two) times daily.   torsemide (DEMADEX) 20 MG tablet Take 20 mg by mouth 2 (two) times daily.   vitamin B-12 (CYANOCOBALAMIN) 1000 MCG tablet Take 1,000 mcg by mouth in the morning and at bedtime.   Wheat Dextrin (BENEFIBER PO) Take 3 tablets by mouth daily.     Allergies:   Ciprofloxacin, Contrast media [iodinated contrast media], Crestor [rosuvastatin], Lipitor [atorvastatin], Nsaids, and Statins   Social History   Socioeconomic History   Marital status: Married    Spouse name: Not on file   Number of children: 2   Years of education: Not on file   Highest education level: Not on file  Occupational History   Occupation: Retired   Tobacco Use   Smoking status: Former   Smokeless tobacco: Former    Quit date: 1997   Tobacco comments:    Former smoker 05/22/21   Vaping Use   Vaping status: Never Used  Substance and Sexual Activity   Alcohol use: Yes    Alcohol/week: 2.0 standard drinks of alcohol    Types: 2 Standard drinks or equivalent per week    Comment: Occ   Drug use: No   Sexual activity: Not on file  Other Topics Concern   Not on file  Social History Narrative   Watchman device 2018   Social Drivers of Health   Financial Resource Strain: Not on file  Food Insecurity: No Food Insecurity (12/07/2022)   Hunger Vital Sign    Worried About Running Out  of Food in the Last Year: Never true    Ran Out of Food in the Last Year: Never true  Transportation Needs: No Transportation Needs (12/07/2022)   PRAPARE - Administrator, Civil Service (Medical): No    Lack of Transportation (Non-Medical): No  Physical Activity: Not on file  Stress: Not on file  Social Connections: Not on file     Family History: The patient's family history includes Breast cancer in her mother; Cancer in her maternal grandfather; Diabetes in her sister; Heart attack in her brother, maternal uncle, and sister; Hypertension in her father; Prostate cancer in her father; Rectal cancer in her maternal uncle; Stroke in her father and maternal uncle. There is no history of Esophageal cancer, Pancreatic cancer, or Stomach cancer. ROS:   Please see the history of present illness.    All 14 point review of systems negative except as described per history of present illness  EKGs/Labs/Other Studies Reviewed:         Recent Labs: 10/15/2022: NT-Pro BNP 4,009 12/06/2022: B Natriuretic Peptide 1,011.0; TSH 2.510 12/14/2022: Magnesium 2.3 01/18/2023: ALT 27; BUN 71; Creatinine, Ser 3.64; Hemoglobin 9.8; Platelets 187.0; Potassium 4.2; Sodium 141  Recent Lipid Panel    Component Value Date/Time   CHOL 125 09/05/2019 0842   TRIG 162 (H) 09/05/2019 0842   HDL 59 09/05/2019 0842   CHOLHDL 2.1 09/05/2019 0842   LDLCALC 39 09/05/2019 0842    Physical Exam:     VS:  BP (!) 120/52   Pulse 90   Ht 5\' 3"  (1.6 m)   Wt 169 lb 12.8 oz (77 kg)   SpO2 95%   BMI 30.08 kg/m     Wt Readings from Last 3 Encounters:  01/21/23 169 lb 12.8 oz (77 kg)  01/18/23 168 lb (76.2 kg)  01/12/23 167 lb (75.8 kg)     GEN:  Well nourished, well developed in no acute distress HEENT: Normal NECK: No JVD; No carotid bruits LYMPHATICS: No lymphadenopathy CARDIAC: Irregularly irregular, heart murmur grade 2/6, no rubs, no gallops RESPIRATORY:  Clear to auscultation without rales, wheezing or rhonchi  ABDOMEN: Soft, non-tender, non-distended MUSCULOSKELETAL:  No edema; No deformity  SKIN: Warm and dry LOWER EXTREMITIES: no swelling NEUROLOGIC:  Alert and oriented x 3 PSYCHIATRIC:  Normal affect   ASSESSMENT:    1. Persistent atrial fibrillation (HCC)   2. Chronic systolic CHF (congestive heart failure) (HCC)   3. Nonrheumatic mitral valve regurgitation   4. Chronic kidney disease, stage 4 (severe) (HCC)   5. Dyslipidemia    PLAN:    In order of problems listed above:  Chest pain this lady with multiple complex history.  I think she will be best served by being admitted to the hospital to rule out.  I be reluctant to put her to cardiac cath laboratory right away unless there is real need for that secondary to her kidney dysfunction her last GFR is only 11 sadly she does have appoint with nephrologist today which probably she will not be able to make it because of the situation.  Again should be admitted to the hospital she will rule out. Chronic diastolic and systolic congestive heart failure appears to be compensated on physical exam. Mitral regurgitation noted. Chronic kidney failure Chem-7 will be checked will avoid nephrotoxic medications. Atrial fibrillation today she is in atrial flutter.  Will try to increase dose of amiodarone to see if we can get her back to normal rhythm. Pacemaker present.  Noted.   Medication Adjustments/Labs and Tests  Ordered: Current medicines are reviewed at length with the patient today.  Concerns regarding medicines are outlined above.  Orders Placed This Encounter  Procedures   EKG 12-Lead   Medication changes: No orders of the defined types were placed in this encounter.   Signed, Georgeanna Lea, MD, [facility] 01/21/2023 8:41 AM    [facility]

## 2023-01-21 NOTE — Patient Instructions (Signed)
Medication Instructions:  Your physician recommends that you continue on your current medications as directed. Please refer to the Current Medication list given to you today.  *If you need a refill on your cardiac medications before your next appointment, please call your pharmacy*   Lab Work: None If you have labs (blood work) drawn today and your tests are completely normal, you will receive your results only by: MyChart Message (if you have MyChart) OR A paper copy in the mail If you have any lab test that is abnormal or we need to change your treatment, we will call you to review the results.   Testing/Procedures: None   Follow-Up: At [facility], you and your health needs are our priority.  As part of our continuing mission to provide you with exceptional heart care, we have created designated Provider Care Teams.  These Care Teams include your primary Cardiologist (physician) and Advanced Practice Providers (APPs -  Physician Assistants and Nurse Practitioners) who all work together to provide you with the care you need, when you need it.  We recommend signing up for the patient portal called "MyChart".  Sign up information is provided on this After Visit Summary.  MyChart is used to connect with patients for Virtual Visits (Telemedicine).  Patients are able to view lab/test results, encounter notes, upcoming appointments, etc.  Non-urgent messages can be sent to your provider as well.   To learn more about what you can do with MyChart, go to [email or web address].    Your next appointment:   Patient follow up to be determined after ER visit  Provider:   Gypsy Balsam, MD    Other Instructions Patient went to the ER via EMS

## 2023-01-22 ENCOUNTER — Ambulatory Visit (HOSPITAL_COMMUNITY): Payer: Medicare Other

## 2023-01-22 DIAGNOSIS — I2510 Atherosclerotic heart disease of native coronary artery without angina pectoris: Secondary | ICD-10-CM | POA: Diagnosis not present

## 2023-01-22 DIAGNOSIS — N179 Acute kidney failure, unspecified: Secondary | ICD-10-CM

## 2023-01-22 DIAGNOSIS — E875 Hyperkalemia: Secondary | ICD-10-CM | POA: Diagnosis not present

## 2023-01-22 DIAGNOSIS — I340 Nonrheumatic mitral (valve) insufficiency: Secondary | ICD-10-CM

## 2023-01-22 DIAGNOSIS — N184 Chronic kidney disease, stage 4 (severe): Secondary | ICD-10-CM

## 2023-01-22 DIAGNOSIS — R079 Chest pain, unspecified: Secondary | ICD-10-CM

## 2023-01-22 DIAGNOSIS — I4819 Other persistent atrial fibrillation: Secondary | ICD-10-CM

## 2023-01-22 DIAGNOSIS — I5032 Chronic diastolic (congestive) heart failure: Secondary | ICD-10-CM

## 2023-01-25 DIAGNOSIS — I455 Other specified heart block: Secondary | ICD-10-CM | POA: Diagnosis not present

## 2023-01-25 DIAGNOSIS — G4733 Obstructive sleep apnea (adult) (pediatric): Secondary | ICD-10-CM | POA: Diagnosis not present

## 2023-01-25 DIAGNOSIS — I252 Old myocardial infarction: Secondary | ICD-10-CM | POA: Diagnosis not present

## 2023-01-25 DIAGNOSIS — M810 Age-related osteoporosis without current pathological fracture: Secondary | ICD-10-CM | POA: Diagnosis not present

## 2023-01-25 DIAGNOSIS — I2510 Atherosclerotic heart disease of native coronary artery without angina pectoris: Secondary | ICD-10-CM | POA: Diagnosis not present

## 2023-01-25 DIAGNOSIS — I700 Atherosclerosis of aorta: Secondary | ICD-10-CM | POA: Diagnosis not present

## 2023-01-25 DIAGNOSIS — D631 Anemia in chronic kidney disease: Secondary | ICD-10-CM | POA: Diagnosis not present

## 2023-01-25 DIAGNOSIS — I4891 Unspecified atrial fibrillation: Secondary | ICD-10-CM | POA: Diagnosis not present

## 2023-01-25 DIAGNOSIS — I110 Hypertensive heart disease with heart failure: Secondary | ICD-10-CM | POA: Diagnosis not present

## 2023-01-25 DIAGNOSIS — I5043 Acute on chronic combined systolic (congestive) and diastolic (congestive) heart failure: Secondary | ICD-10-CM | POA: Diagnosis not present

## 2023-01-25 DIAGNOSIS — N179 Acute kidney failure, unspecified: Secondary | ICD-10-CM | POA: Diagnosis not present

## 2023-01-25 DIAGNOSIS — E1122 Type 2 diabetes mellitus with diabetic chronic kidney disease: Secondary | ICD-10-CM | POA: Diagnosis not present

## 2023-01-25 DIAGNOSIS — I081 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: Secondary | ICD-10-CM | POA: Diagnosis not present

## 2023-01-25 DIAGNOSIS — N184 Chronic kidney disease, stage 4 (severe): Secondary | ICD-10-CM | POA: Diagnosis not present

## 2023-01-25 DIAGNOSIS — J9601 Acute respiratory failure with hypoxia: Secondary | ICD-10-CM | POA: Diagnosis not present

## 2023-01-26 ENCOUNTER — Ambulatory Visit (HOSPITAL_COMMUNITY)
Admission: RE | Admit: 2023-01-26 | Discharge: 2023-01-26 | Disposition: A | Discharge status: Home / Self Care | Payer: Medicare Other | Source: Ambulatory Visit | Attending: Gastroenterology | Admitting: Gastroenterology

## 2023-01-26 DIAGNOSIS — R195 Other fecal abnormalities: Secondary | ICD-10-CM | POA: Diagnosis not present

## 2023-01-26 DIAGNOSIS — R932 Abnormal findings on diagnostic imaging of liver and biliary tract: Secondary | ICD-10-CM | POA: Insufficient documentation

## 2023-01-26 DIAGNOSIS — K828 Other specified diseases of gallbladder: Secondary | ICD-10-CM | POA: Diagnosis not present

## 2023-01-26 DIAGNOSIS — I77811 Abdominal aortic ectasia: Secondary | ICD-10-CM | POA: Diagnosis not present

## 2023-01-29 DIAGNOSIS — N185 Chronic kidney disease, stage 5: Secondary | ICD-10-CM | POA: Diagnosis not present

## 2023-01-29 DIAGNOSIS — E113213 Type 2 diabetes mellitus with mild nonproliferative diabetic retinopathy with macular edema, bilateral: Secondary | ICD-10-CM | POA: Diagnosis not present

## 2023-01-29 DIAGNOSIS — H353132 Nonexudative age-related macular degeneration, bilateral, intermediate dry stage: Secondary | ICD-10-CM | POA: Diagnosis not present

## 2023-01-29 DIAGNOSIS — H353231 Exudative age-related macular degeneration, bilateral, with active choroidal neovascularization: Secondary | ICD-10-CM | POA: Diagnosis not present

## 2023-01-29 DIAGNOSIS — H43813 Vitreous degeneration, bilateral: Secondary | ICD-10-CM | POA: Diagnosis not present

## 2023-02-01 DIAGNOSIS — I5043 Acute on chronic combined systolic (congestive) and diastolic (congestive) heart failure: Secondary | ICD-10-CM | POA: Diagnosis not present

## 2023-02-01 DIAGNOSIS — J9601 Acute respiratory failure with hypoxia: Secondary | ICD-10-CM | POA: Diagnosis not present

## 2023-02-01 DIAGNOSIS — I110 Hypertensive heart disease with heart failure: Secondary | ICD-10-CM | POA: Diagnosis not present

## 2023-02-02 DIAGNOSIS — I455 Other specified heart block: Secondary | ICD-10-CM | POA: Diagnosis not present

## 2023-02-02 DIAGNOSIS — N179 Acute kidney failure, unspecified: Secondary | ICD-10-CM | POA: Diagnosis not present

## 2023-02-02 DIAGNOSIS — I110 Hypertensive heart disease with heart failure: Secondary | ICD-10-CM | POA: Diagnosis not present

## 2023-02-02 DIAGNOSIS — I4891 Unspecified atrial fibrillation: Secondary | ICD-10-CM | POA: Diagnosis not present

## 2023-02-02 DIAGNOSIS — G4733 Obstructive sleep apnea (adult) (pediatric): Secondary | ICD-10-CM | POA: Diagnosis not present

## 2023-02-02 DIAGNOSIS — M109 Gout, unspecified: Secondary | ICD-10-CM | POA: Diagnosis not present

## 2023-02-02 DIAGNOSIS — I700 Atherosclerosis of aorta: Secondary | ICD-10-CM | POA: Diagnosis not present

## 2023-02-02 DIAGNOSIS — I255 Ischemic cardiomyopathy: Secondary | ICD-10-CM | POA: Diagnosis not present

## 2023-02-02 DIAGNOSIS — N184 Chronic kidney disease, stage 4 (severe): Secondary | ICD-10-CM | POA: Diagnosis not present

## 2023-02-02 DIAGNOSIS — I081 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: Secondary | ICD-10-CM | POA: Diagnosis not present

## 2023-02-02 DIAGNOSIS — D631 Anemia in chronic kidney disease: Secondary | ICD-10-CM | POA: Diagnosis not present

## 2023-02-02 DIAGNOSIS — E039 Hypothyroidism, unspecified: Secondary | ICD-10-CM | POA: Diagnosis not present

## 2023-02-02 DIAGNOSIS — J9601 Acute respiratory failure with hypoxia: Secondary | ICD-10-CM | POA: Diagnosis not present

## 2023-02-02 DIAGNOSIS — I252 Old myocardial infarction: Secondary | ICD-10-CM | POA: Diagnosis not present

## 2023-02-02 DIAGNOSIS — I2510 Atherosclerotic heart disease of native coronary artery without angina pectoris: Secondary | ICD-10-CM | POA: Diagnosis not present

## 2023-02-02 DIAGNOSIS — E1122 Type 2 diabetes mellitus with diabetic chronic kidney disease: Secondary | ICD-10-CM | POA: Diagnosis not present

## 2023-02-02 DIAGNOSIS — K219 Gastro-esophageal reflux disease without esophagitis: Secondary | ICD-10-CM | POA: Diagnosis not present

## 2023-02-02 DIAGNOSIS — K559 Vascular disorder of intestine, unspecified: Secondary | ICD-10-CM | POA: Diagnosis not present

## 2023-02-02 DIAGNOSIS — K5900 Constipation, unspecified: Secondary | ICD-10-CM | POA: Diagnosis not present

## 2023-02-02 DIAGNOSIS — D509 Iron deficiency anemia, unspecified: Secondary | ICD-10-CM | POA: Diagnosis not present

## 2023-02-02 DIAGNOSIS — E785 Hyperlipidemia, unspecified: Secondary | ICD-10-CM | POA: Diagnosis not present

## 2023-02-02 DIAGNOSIS — M810 Age-related osteoporosis without current pathological fracture: Secondary | ICD-10-CM | POA: Diagnosis not present

## 2023-02-02 DIAGNOSIS — I5043 Acute on chronic combined systolic (congestive) and diastolic (congestive) heart failure: Secondary | ICD-10-CM | POA: Diagnosis not present

## 2023-02-04 DIAGNOSIS — I1310 Hypertensive heart and chronic kidney disease without heart failure, with stage 1 through stage 4 chronic kidney disease, or unspecified chronic kidney disease: Secondary | ICD-10-CM | POA: Diagnosis not present

## 2023-02-04 DIAGNOSIS — N179 Acute kidney failure, unspecified: Secondary | ICD-10-CM | POA: Diagnosis not present

## 2023-02-04 DIAGNOSIS — N184 Chronic kidney disease, stage 4 (severe): Secondary | ICD-10-CM | POA: Diagnosis not present

## 2023-02-04 DIAGNOSIS — D631 Anemia in chronic kidney disease: Secondary | ICD-10-CM | POA: Diagnosis not present

## 2023-02-09 ENCOUNTER — Ambulatory Visit: Payer: Medicare Other | Attending: Cardiology | Admitting: Cardiology

## 2023-02-09 ENCOUNTER — Encounter: Payer: Self-pay | Admitting: Cardiology

## 2023-02-09 VITALS — BP 116/74 | HR 64 | Ht 63.0 in | Wt 169.0 lb

## 2023-02-09 DIAGNOSIS — I340 Nonrheumatic mitral (valve) insufficiency: Secondary | ICD-10-CM

## 2023-02-09 DIAGNOSIS — I129 Hypertensive chronic kidney disease with stage 1 through stage 4 chronic kidney disease, or unspecified chronic kidney disease: Secondary | ICD-10-CM | POA: Diagnosis not present

## 2023-02-09 DIAGNOSIS — I480 Paroxysmal atrial fibrillation: Secondary | ICD-10-CM

## 2023-02-09 DIAGNOSIS — I5022 Chronic systolic (congestive) heart failure: Secondary | ICD-10-CM | POA: Diagnosis not present

## 2023-02-09 DIAGNOSIS — N184 Chronic kidney disease, stage 4 (severe): Secondary | ICD-10-CM

## 2023-02-09 DIAGNOSIS — Z950 Presence of cardiac pacemaker: Secondary | ICD-10-CM | POA: Diagnosis not present

## 2023-02-09 NOTE — Patient Instructions (Addendum)
 Medication Instructions:  Your physician recommends that you continue on your current medications as directed. Please refer to the Current Medication list given to you today.  *If you need a refill on your cardiac medications before your next appointment, please call your pharmacy*   Lab Work: Your physician recommends that you return for lab work in: 2-3 weeks when come for Echo You need to have labs done when you are fasting.  You can come Monday through Friday 8:30 am to 12:00 pm and 1:15 to 4:30. You do not need to make an appointment as the order has already been placed. The labs you are going to have done are BMET, CBC.    Testing/Procedures: 2-3 weeks Your physician has requested that you have an echocardiogram. Echocardiography is a painless test that uses sound waves to create images of your heart. It provides your doctor with information about the size and shape of your heart and how well your heart's chambers and valves are working. This procedure takes approximately one hour. There are no restrictions for this procedure. Please do NOT wear cologne, perfume, aftershave, or lotions (deodorant is allowed). Please arrive 15 minutes prior to your appointment time.  Please note: We ask at that you not bring children with you during ultrasound (echo/ vascular) testing. Due to room size and safety concerns, children are not allowed in the ultrasound rooms during exams. Our front office staff cannot provide observation of children in our lobby area while testing is being conducted. An adult accompanying a patient to their appointment will only be allowed in the ultrasound room at the discretion of the ultrasound technician under special circumstances. We apologize for any inconvenience.    Follow-Up: At [facility], you and your health needs are our priority.  As part of our continuing mission to provide you with exceptional heart care, we have created designated Provider Care Teams.  These  Care Teams include your primary Cardiologist (physician) and Advanced Practice Providers (APPs -  Physician Assistants and Nurse Practitioners) who all work together to provide you with the care you need, when you need it.  We recommend signing up for the patient portal called MyChart.  Sign up information is provided on this After Visit Summary.  MyChart is used to connect with patients for Virtual Visits (Telemedicine).  Patients are able to view lab/test results, encounter notes, upcoming appointments, etc.  Non-urgent messages can be sent to your provider as well.   To learn more about what you can do with MyChart, go to [email or web address].    Your next appointment:   1 month(s)  The format for your next appointment:   In Person  Provider:   Lamar Fitch, MD    Other Instructions NA

## 2023-02-09 NOTE — Addendum Note (Signed)
 Addended by: Baldo Ash D on: 02/09/2023 03:44 PM   Modules accepted: Orders

## 2023-02-09 NOTE — Progress Notes (Signed)
 Cardiology Office Note:    Date:  02/09/2023   ID:  Amanda [name], DOB 1940-10-24, MRN [id number]  PCP:  Street, Lonni HERO, MD  Cardiologist:  Lamar Fitch, MD    Referring MD: [address], Lonni HERO, *   No chief complaint on file.   History of Present Illness:    Amanda [name] is a 83 y.o. 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  Last time of seeing her she was having chest pain she was sent to the emergency room likely workup was negative.  Today she says she is feeling fine.  She did see nephrologist in the meantime.  Denies have any palpitations is fatigue and tiredness is the leading complaint not much shortness of breath no swelling of lower extremities  Past Medical History:  Diagnosis Date   (HFpEF) heart failure with preserved ejection fraction (HCC) 12/13/2022   Abnormal nuclear stress test 06/23/2016   Overview:   Added automatically from request for surgery [id number]     Acute ischemic stroke (HCC)    Acute kidney failure, unspecified (HCC) 02/02/2022   Acute metabolic encephalopathy    Acute on chronic combined systolic and diastolic hrt fail (HCC) 12/16/2022   Acute on chronic systolic (congestive) heart failure (HCC)    Acute renal insufficiency 09/02/2016   Acute respiratory failure with hypoxia (HCC) 12/16/2022   Age-related osteoporosis without current pathological fracture 02/02/2022   AKI (acute kidney injury) (HCC) 09/01/2016   Anemia due to stage 4 chronic kidney disease (HCC) 04/04/2015   Anemia in chronic kidney disease 02/02/2022   Atherosclerosis of aorta (HCC) 02/02/2022   Athscl heart disease of native coronary artery w/o  ang pctrs 02/02/2022   Atrial fibrillation (HCC)    Atrial fibrillation with RVR (HCC) 12/06/2022   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) 04/04/2015   Bradycardia 09/17/2014   Chest pain 06/23/2016   Overview:  Added automatically from request for surgery [id number]   Chronic anemia    Chronic kidney disease (CKD), stage III (moderate) (HCC)    Chronic kidney disease, stage 4 (severe) (HCC) 02/02/2022   Chronic systolic CHF (congestive heart failure) (HCC)    Closed left hip fracture (HCC) 03/23/2021   Constipation 03/28/2021   Constipation, unspecified 02/02/2022   Coronary artery disease 10/12/2016   Non drug-eluting stent implanted in June 2018 to mid RCA   08/08/2016 10:37  Angiographic Findings  Cardiac Arteries and Lesion Findings LMCA: 0% and Normal. LAD: 0% and Normal. RCA:   Lesion on Mid RCA: Mid subsection.95% stenosis reduced to 0%. Pre procedure   TIMI II flow was noted. Post Procedure TIMI III flow was present. Poor run   off was present. The lesion was diagnosed as High Risk (C).    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) 04/04/2015   Diabetes mellitus without complication (HCC)    Diverticulosis    Dyslipidemia 09/17/2014   Gastro-esophageal reflux disease without esophagitis 02/02/2022   Gastroesophageal reflux    GI bleed 08/06/2016   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   History of cardiac monitoring 02/23/2018   monitor inserted   History of falling 02/02/2022   History of iron deficiency anemia 12/28/2017   History  of transesophageal echocardiography (TEE)    Hypercalcemia 04/04/2015   Hyperlipidemia    Hyperlipidemia, unspecified 02/02/2022   Hypertensive disorder 07/21/2017   Hypertensive heart disease with heart failure (HCC)    Hypothyroidism, unspecified 02/02/2022   Iron deficiency anemia 12/28/2017   Iron deficiency anemia, unspecified 02/02/2022   Ischemic cardiomyopathy 02/02/2022    Kidney disease 07/21/2017   Long term (current) use of insulin  (HCC) 02/02/2022   LV dysfunction 09/02/2016   Mitral regurgitation severe based on TEE from 2022 April 06/10/2020   Myocardial infarction [facility])    NSTEMI (non-ST elevated myocardial infarction) (HCC) 08/06/2016   Obstructive sleep apnea 09/17/2014   Obstructive sleep apnea (adult) (pediatric) 02/02/2022   Old myocardial infarction 02/02/2022   Orthostatic hypotension 12/28/2017   OSA on CPAP    Other specified heart block 02/02/2022   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   Paroxysmal atrial flutter (HCC) 07/22/2022   Personal history of other diseases of the digestive system 02/02/2022   Postural dizziness with presyncope 09/23/2017   Presence of cardiac pacemaker 02/02/2022   Presence of other cardiac implants and grafts 02/02/2022   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/24/2017   Prsnl hx of TIA (TIA), and cereb infrc w/o resid deficits 02/02/2022   Recurrent left pleural effusion 03/14/2015   Renal failure, chronic, stage 3 (moderate) (HCC)    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/2022   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   Secondary hypercoagulable state (HCC) 04/23/2021   Syncope 11/04/2017   Systolic heart failure (HCC)    Thyroid  disease    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   Unspecified atrial fibrillation (HCC) 12/16/2022   Vascular disorder of intestine, unspecified (HCC) 02/02/2022   Vitamin D  deficiency 04/04/2015    Past Surgical History:  Procedure Laterality Date   BUBBLE STUDY  05/16/2020   Procedure: BUBBLE STUDY;  Surgeon: Loni Soyla LABOR, MD;  Location: [facility] ENDOSCOPY;  Service: Cardiovascular;;   CARDIAC CATHETERIZATION     CARDIOVERSION N/A 05/16/2020   Procedure: CARDIOVERSION;  Surgeon: Loni Soyla LABOR, MD;  Location: [facility] ENDOSCOPY;  Service: Cardiovascular;  Laterality: N/A;    CARDIOVERSION N/A 09/06/2020   Procedure: CARDIOVERSION;  Surgeon: Kate Lonni CROME, MD;  Location: [facility] ENDOSCOPY;  Service: Cardiovascular;  Laterality: N/A;   CARDIOVERSION N/A 05/16/2021   Procedure: CARDIOVERSION;  Surgeon: Shlomo Wilbert SAUNDERS, MD;  Location: [facility] ENDOSCOPY;  Service: Cardiovascular;  Laterality: N/A;   CARDIOVERSION N/A 08/12/2022   Procedure: CARDIOVERSION;  Surgeon: Sheena Pugh, DO;  Location: [facility] INVASIVE CV LAB;  Service: Cardiovascular;  Laterality: N/A;   CARDIOVERSION N/A 09/30/2022   Procedure: CARDIOVERSION;  Surgeon: Lonni Slain, MD;  Location: [facility] INVASIVE CV LAB;  Service: Cardiovascular;  Laterality: N/A;   CARDIOVERSION N/A 12/08/2022   Procedure: CARDIOVERSION (CATH LAB);  Surgeon: Santo Stanly LABOR, MD;  Location: [facility] INVASIVE CV LAB;  Service: Cardiovascular;  Laterality: N/A;   CARDIOVERSION N/A 12/14/2022   Procedure: CARDIOVERSION (CATH LAB);  Surgeon: Lonni Slain, MD;  Location: [facility] INVASIVE CV LAB;  Service: Cardiovascular;  Laterality: N/A;   COLONOSCOPY  11/21/2014   Moderate predominantly sigmoid diverticulosis. Otherwise noraml collonscopy to TI.    CORONARY ANGIOPLASTY WITH STENT PLACEMENT  08/2016   EXCISIONAL HEMORRHOIDECTOMY     LEFT ATRIAL APPENDAGE OCCLUSION  11/2016   in [place]   LOOP RECORDER INSERTION N/A 02/23/2018   Procedure: LOOP RECORDER INSERTION;  Surgeon: Inocencio Soyla Lunger, MD;  Location: [facility] INVASIVE CV LAB;  Service: Cardiovascular;  Laterality: N/A;   LOOP RECORDER REMOVAL N/A 04/24/2019   Procedure: LOOP RECORDER REMOVAL;  Surgeon: Inocencio Soyla Lunger, MD;  Location: [facility] INVASIVE CV LAB;  Service: Cardiovascular;  Laterality: N/A;   NOSE SURGERY     PACEMAKER IMPLANT N/A 04/24/2019   Procedure: PACEMAKER IMPLANT;  Surgeon: Inocencio Soyla Lunger, MD;  Location: [facility] INVASIVE CV LAB;  Service: Cardiovascular;  Laterality: N/A;   TEE WITH CARDIOVERSION     TEE WITHOUT CARDIOVERSION N/A 05/16/2020    Procedure: TRANSESOPHAGEAL ECHOCARDIOGRAM (TEE);  Surgeon: Loni Soyla LABOR, MD;  Location: [facility] ENDOSCOPY;  Service: Cardiovascular;  Laterality: N/A;   TEE WITHOUT CARDIOVERSION N/A 08/12/2022   Procedure: TRANSESOPHAGEAL ECHOCARDIOGRAM;  Surgeon: Sheena Pugh, DO;  Location: [facility] INVASIVE CV LAB;  Service: Cardiovascular;  Laterality: N/A;   TEE WITHOUT CARDIOVERSION N/A 09/30/2022   Procedure: TRANSESOPHAGEAL ECHOCARDIOGRAM;  Surgeon: Lonni Slain, MD;  Location: [facility] INVASIVE CV LAB;  Service: Cardiovascular;  Laterality: N/A;   TOTAL HIP REVISION Left 03/25/2021   Procedure: LEFT POSTERIOR TOTAL HIP  ZIMMER CABLES;  Surgeon: Ernie Cough, MD;  Location: [facility] ORS;  Service: Orthopedics;  Laterality: Left;    Current Medications: Current Meds  Medication Sig   amiodarone  (PACERONE ) 200 MG tablet Take 200 mg by mouth 2 (two) times daily.   amLODipine  (NORVASC ) 10 MG tablet Take by mouth daily.   Biotin 1000 MCG tablet Take 1,000 mcg by mouth daily.   blood glucose meter kit and supplies KIT Dispense based on patient and insurance preference. Use up to four times daily as directed.   carboxymethylcellul-glycerin (REFRESH RELIEVA) 0.5-0.9 % ophthalmic solution Place 1 drop into both eyes 4 (four) times daily as needed for dry eyes. VE   Cholecalciferol  (VITAMIN D3) 50 MCG (2000 UT) TABS Take 2,000 Units by mouth daily at 12 noon.   cloNIDine  (CATAPRES ) 0.1 MG tablet Take 0.1 mg by mouth 2 (two) times daily.   Coenzyme Q10 100 MG capsule Take 100 mg by mouth 2 (two) times daily.   denosumab  (PROLIA ) 60 MG/ML SOSY injection Inject 60 mg into the skin every 6 (six) months.   estradiol  (ESTRACE ) 0.1 MG/GM vaginal cream Place 1 Applicatorful vaginally every Monday, Wednesday, and Friday.    Evolocumab  (REPATHA  SURECLICK) 140 MG/ML SOAJ Inject 140 mg into the skin every 14 (fourteen) days.   ferrous sulfate  325 (65 FE) MG EC tablet Take 325 mg by mouth 2 (two) times daily with breakfast and  lunch.   folic acid  (FOLVITE ) 800 MCG tablet Take 800 mcg by mouth daily at 12 noon.   Glucosamine-Chondroitin (OSTEO BI-FLEX REGULAR STRENGTH PO) Take 25 mcg by mouth daily at 12 noon. Plus D3   HUMALOG KWIKPEN 100 UNIT/ML KwikPen Inject 2-6 Units into the skin as directed. Per sliding scale   hydrALAZINE  (APRESOLINE ) 50 MG tablet Take 1 tablet (50 mg total) by mouth every 6 (six) hours.   isosorbide  mononitrate (IMDUR ) 60 MG 24 hr tablet Take 180 mg by mouth daily.   Lactobacillus (FLORAJEN ACIDOPHILUS) CAPS Take 1 capsule by mouth daily at 12 noon. 390 mg each   levothyroxine  (SYNTHROID , LEVOTHROID) 50 MCG tablet Take 50 mcg by mouth daily before breakfast.   metoprolol  tartrate (LOPRESSOR ) 25 MG tablet Take 25 mg by mouth 2 (two) times daily.   Multiple Vitamins-Minerals (PRESERVISION AREDS 2) CAPS Take 1 capsule by mouth 2 (two) times daily.   nitroGLYCERIN  (NITROSTAT ) 0.4 MG SL tablet Place 0.4 mg under  the tongue every 5 (five) minutes as needed for chest pain.   omega-3 acid ethyl esters (LOVAZA ) 1 g capsule Take 2 capsules (2 g total) by mouth daily.   pantoprazole  (PROTONIX ) 40 MG tablet Take 1 tablet (40 mg total) by mouth daily.   pravastatin  (PRAVACHOL ) 40 MG tablet Take 40 mg by mouth at bedtime.   ranolazine  (RANEXA ) 500 MG 12 hr tablet Take 1 tablet (500 mg total) by mouth 2 (two) times daily.   sulfamethoxazole-trimethoprim (BACTRIM DS) 800-160 MG tablet Take 1 tablet by mouth 2 (two) times daily.   torsemide  (DEMADEX ) 20 MG tablet Take 20 mg by mouth 2 (two) times daily.   vitamin B-12 (CYANOCOBALAMIN ) 1000 MCG tablet Take 1,000 mcg by mouth in the morning and at bedtime.   Wheat Dextrin (BENEFIBER PO) Take 3 tablets by mouth daily.     Allergies:   Ciprofloxacin, Contrast media [iodinated contrast media], Crestor [rosuvastatin], Lipitor [atorvastatin], Nsaids, and Statins   Social History   Socioeconomic History   Marital status: Married    Spouse name: Not on file    Number of children: 2   Years of education: Not on file   Highest education level: Not on file  Occupational History   Occupation: Retired   Tobacco Use   Smoking status: Former   Smokeless tobacco: Former    Quit date: 1997   Tobacco comments:    Former smoker 05/22/21  Vaping Use   Vaping status: Never Used  Substance and Sexual Activity   Alcohol use: Yes    Alcohol/week: 2.0 standard drinks of alcohol    Types: 2 Standard drinks or equivalent per week    Comment: Occ   Drug use: No   Sexual activity: Not on file  Other Topics Concern   Not on file  Social History Narrative   Watchman device 2018   Social Drivers of Health   Financial Resource Strain: Not on file  Food Insecurity: No Food Insecurity (12/07/2022)   Hunger Vital Sign    Worried About Running Out of Food in the Last Year: Never true    Ran Out of Food in the Last Year: Never true  Transportation Needs: No Transportation Needs (12/07/2022)   PRAPARE - Administrator, Civil Service (Medical): No    Lack of Transportation (Non-Medical): No  Physical Activity: Not on file  Stress: Not on file  Social Connections: Not on file     Family History: The patient's family history includes Breast cancer in her mother; Cancer in her maternal grandfather; Diabetes in her sister; Heart attack in her brother, maternal uncle, and sister; Hypertension in her father; Prostate cancer in her father; Rectal cancer in her maternal uncle; Stroke in her father and maternal uncle. There is no history of Esophageal cancer, Pancreatic cancer, or Stomach cancer. ROS:   Please see the history of present illness.    All 14 point review of systems negative except as described per history of present illness  EKGs/Labs/Other Studies Reviewed:    EKG Interpretation Date/Time:  Tuesday February 09 2023 15:10:28 EST Ventricular Rate:  64 PR Interval:    QRS Duration:  186 QT Interval:  502 QTC Calculation: 517 R  Axis:   176  Text Interpretation: Ventricular-paced rhythm with premature ventricular or aberrantly conducted complexes When compared with ECG of 21-Jan-2023 08:32, Vent. rate has decreased BY  11 BPM Confirmed by Bernie Charleston [phone]) on 02/09/2023 3:20:20 PM    Recent Labs: 10/15/2022:  NT-Pro BNP 4,009 12/06/2022: B Natriuretic Peptide 1,011.0; TSH 2.510 12/14/2022: Magnesium 2.3 01/18/2023: ALT 27; BUN 71; Creatinine, Ser 3.64; Hemoglobin 9.8; Platelets 187.0; Potassium 4.2; Sodium 141  Recent Lipid Panel    Component Value Date/Time   CHOL 125 09/05/2019 0842   TRIG 162 (H) 09/05/2019 0842   HDL 59 09/05/2019 0842   CHOLHDL 2.1 09/05/2019 0842   LDLCALC 39 09/05/2019 0842    Physical Exam:    VS:  BP 116/74   Pulse 64   Ht 5' 3 (1.6 m)   Wt 169 lb (76.7 kg)   SpO2 99%   BMI 29.94 kg/m     Wt Readings from Last 3 Encounters:  02/09/23 169 lb (76.7 kg)  01/21/23 169 lb 12.8 oz (77 kg)  01/18/23 168 lb (76.2 kg)     GEN:  Well nourished, well developed in no acute distress HEENT: Normal NECK: No JVD; No carotid bruits LYMPHATICS: No lymphadenopathy CARDIAC: RRR, no murmurs, no rubs, no gallops RESPIRATORY:  Clear to auscultation without rales, wheezing or rhonchi  ABDOMEN: Soft, non-tender, non-distended MUSCULOSKELETAL:  No edema; No deformity  SKIN: Warm and dry LOWER EXTREMITIES: no swelling NEUROLOGIC:  Alert and oriented x 3 PSYCHIATRIC:  Normal affect   ASSESSMENT:    1. Paroxysmal atrial fibrillation (HCC)   2. 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)   3. Chronic systolic CHF (congestive heart failure) (HCC)   4. Pacemaker Medtronic device   5. Nonrheumatic mitral valve regurgitation    PLAN:    In order of problems listed above:  Persistent atrial fibrillation.  Today it looks like she is in atrial flutter.  Will continue 200 amiodarone  twice daily with hope that she may be able to convert to sinus rhythm.  However I  will confer with EP team for consideration of different approaches.  She was scheduled to have atrial fibrillation ablation however she ended up having GI bleed and decompensated CHF and procedure has been canceled. Cardiomyopathy overall she is hemodynamically compensated today.  On medication that she is able to tolerate.  Will continue present management. Mitral regurgitation last assessment moderate to severe.  Will repeat limited echocardiogram to reassess the lesion and I see her back in about 1 month   Medication Adjustments/Labs and Tests Ordered: Current medicines are reviewed at length with the patient today.  Concerns regarding medicines are outlined above.  Orders Placed This Encounter  Procedures   EKG 12-Lead   Medication changes: No orders of the defined types were placed in this encounter.   Signed, Lamar DOROTHA Fitch, MD, [facility] 02/09/2023 3:37 PM    [facility]

## 2023-02-10 DIAGNOSIS — N184 Chronic kidney disease, stage 4 (severe): Secondary | ICD-10-CM | POA: Diagnosis not present

## 2023-02-10 DIAGNOSIS — I110 Hypertensive heart disease with heart failure: Secondary | ICD-10-CM | POA: Diagnosis not present

## 2023-02-10 DIAGNOSIS — I455 Other specified heart block: Secondary | ICD-10-CM | POA: Diagnosis not present

## 2023-02-10 DIAGNOSIS — I700 Atherosclerosis of aorta: Secondary | ICD-10-CM | POA: Diagnosis not present

## 2023-02-10 DIAGNOSIS — I2510 Atherosclerotic heart disease of native coronary artery without angina pectoris: Secondary | ICD-10-CM | POA: Diagnosis not present

## 2023-02-10 DIAGNOSIS — I4891 Unspecified atrial fibrillation: Secondary | ICD-10-CM | POA: Diagnosis not present

## 2023-02-10 DIAGNOSIS — G4733 Obstructive sleep apnea (adult) (pediatric): Secondary | ICD-10-CM | POA: Diagnosis not present

## 2023-02-10 DIAGNOSIS — J9601 Acute respiratory failure with hypoxia: Secondary | ICD-10-CM | POA: Diagnosis not present

## 2023-02-10 DIAGNOSIS — I252 Old myocardial infarction: Secondary | ICD-10-CM | POA: Diagnosis not present

## 2023-02-10 DIAGNOSIS — I5043 Acute on chronic combined systolic (congestive) and diastolic (congestive) heart failure: Secondary | ICD-10-CM | POA: Diagnosis not present

## 2023-02-10 DIAGNOSIS — M810 Age-related osteoporosis without current pathological fracture: Secondary | ICD-10-CM | POA: Diagnosis not present

## 2023-02-10 DIAGNOSIS — E1122 Type 2 diabetes mellitus with diabetic chronic kidney disease: Secondary | ICD-10-CM | POA: Diagnosis not present

## 2023-02-10 DIAGNOSIS — I081 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: Secondary | ICD-10-CM | POA: Diagnosis not present

## 2023-02-10 DIAGNOSIS — N179 Acute kidney failure, unspecified: Secondary | ICD-10-CM | POA: Diagnosis not present

## 2023-02-10 DIAGNOSIS — D631 Anemia in chronic kidney disease: Secondary | ICD-10-CM | POA: Diagnosis not present

## 2023-02-11 ENCOUNTER — Telehealth: Payer: Self-pay

## 2023-02-11 NOTE — Telephone Encounter (Signed)
 PT has some questions about the procedure. Please advise.

## 2023-02-11 NOTE — Telephone Encounter (Signed)
 Question answered about time

## 2023-02-11 NOTE — Telephone Encounter (Signed)
 Patient instructed with new instructions. She stated she is only doing her insulin , no diabetic pills and not on her blood thinner and to stop her iron supplement as of now. She voiced understanding and knows the hospital should be calling her. And she knows when to stop eating and drinking

## 2023-02-14 NOTE — Anesthesia Preprocedure Evaluation (Addendum)
 Anesthesia Evaluation  Patient identified by MRN, date of birth, ID band Patient awake    Reviewed: Allergy & Precautions, NPO status , Patient's Chart, lab work & pertinent test results  Airway Mallampati: II  TM Distance: >3 FB Neck ROM: Full    Dental no notable dental hx. (+) Teeth Intact, Dental Advisory Given   Pulmonary sleep apnea , former smoker   Pulmonary exam normal breath sounds clear to auscultation       Cardiovascular hypertension, + CAD, + Past MI and +CHF  Normal cardiovascular exam+ dysrhythmias Atrial Fibrillation + pacemaker  Rhythm:Regular Rate:Normal     Neuro/Psych    GI/Hepatic ,GERD  ,,  Endo/Other  diabetesHypothyroidism    Renal/GU CRFRenal disease     Musculoskeletal   Abdominal   Peds  Hematology   Anesthesia Other Findings All: Cipro, Crestor , Nsaids, Statins  Reproductive/Obstetrics                             Anesthesia Physical Anesthesia Plan  ASA: 4  Anesthesia Plan: MAC   Post-op Pain Management:    Induction:   PONV Risk Score and Plan: Propofol  infusion and Treatment may vary due to age or medical condition  Airway Management Planned:   Additional Equipment: None  Intra-op Plan:   Post-operative Plan:   Informed Consent: I have reviewed the patients History and Physical, chart, labs and discussed the procedure including the risks, benefits and alternatives for the proposed anesthesia with the patient or authorized representative who has indicated his/her understanding and acceptance.     Dental advisory given  Plan Discussed with: CRNA and Anesthesiologist  Anesthesia Plan Comments:         Anesthesia Quick Evaluation

## 2023-02-15 ENCOUNTER — Ambulatory Visit (HOSPITAL_COMMUNITY): Payer: Self-pay | Admitting: Anesthesiology

## 2023-02-15 ENCOUNTER — Ambulatory Visit (HOSPITAL_COMMUNITY)
Admission: RE | Admit: 2023-02-15 | Discharge: 2023-02-15 | Disposition: A | Discharge status: Home / Self Care | Payer: Medicare Other | Attending: Gastroenterology | Admitting: Gastroenterology

## 2023-02-15 ENCOUNTER — Ambulatory Visit (HOSPITAL_BASED_OUTPATIENT_CLINIC_OR_DEPARTMENT_OTHER): Payer: Medicare Other | Admitting: Anesthesiology

## 2023-02-15 ENCOUNTER — Other Ambulatory Visit: Payer: Self-pay

## 2023-02-15 ENCOUNTER — Encounter (HOSPITAL_COMMUNITY): Payer: Self-pay | Admitting: Gastroenterology

## 2023-02-15 ENCOUNTER — Encounter (HOSPITAL_COMMUNITY)
Admission: RE | Disposition: A | Discharge status: Home / Self Care | Payer: Self-pay | Source: Home / Self Care | Attending: Gastroenterology

## 2023-02-15 DIAGNOSIS — I2510 Atherosclerotic heart disease of native coronary artery without angina pectoris: Secondary | ICD-10-CM

## 2023-02-15 DIAGNOSIS — K3189 Other diseases of stomach and duodenum: Secondary | ICD-10-CM

## 2023-02-15 DIAGNOSIS — D509 Iron deficiency anemia, unspecified: Secondary | ICD-10-CM | POA: Diagnosis not present

## 2023-02-15 DIAGNOSIS — G4733 Obstructive sleep apnea (adult) (pediatric): Secondary | ICD-10-CM | POA: Insufficient documentation

## 2023-02-15 DIAGNOSIS — R932 Abnormal findings on diagnostic imaging of liver and biliary tract: Secondary | ICD-10-CM

## 2023-02-15 DIAGNOSIS — E1122 Type 2 diabetes mellitus with diabetic chronic kidney disease: Secondary | ICD-10-CM | POA: Insufficient documentation

## 2023-02-15 DIAGNOSIS — I4891 Unspecified atrial fibrillation: Secondary | ICD-10-CM | POA: Diagnosis not present

## 2023-02-15 DIAGNOSIS — K219 Gastro-esophageal reflux disease without esophagitis: Secondary | ICD-10-CM | POA: Diagnosis not present

## 2023-02-15 DIAGNOSIS — R933 Abnormal findings on diagnostic imaging of other parts of digestive tract: Secondary | ICD-10-CM

## 2023-02-15 DIAGNOSIS — D631 Anemia in chronic kidney disease: Secondary | ICD-10-CM | POA: Insufficient documentation

## 2023-02-15 DIAGNOSIS — N183 Chronic kidney disease, stage 3 unspecified: Secondary | ICD-10-CM | POA: Insufficient documentation

## 2023-02-15 DIAGNOSIS — K449 Diaphragmatic hernia without obstruction or gangrene: Secondary | ICD-10-CM | POA: Insufficient documentation

## 2023-02-15 DIAGNOSIS — I252 Old myocardial infarction: Secondary | ICD-10-CM | POA: Diagnosis not present

## 2023-02-15 DIAGNOSIS — N184 Chronic kidney disease, stage 4 (severe): Secondary | ICD-10-CM | POA: Insufficient documentation

## 2023-02-15 DIAGNOSIS — K2970 Gastritis, unspecified, without bleeding: Secondary | ICD-10-CM | POA: Diagnosis not present

## 2023-02-15 DIAGNOSIS — Z87891 Personal history of nicotine dependence: Secondary | ICD-10-CM | POA: Insufficient documentation

## 2023-02-15 DIAGNOSIS — T182XXA Foreign body in stomach, initial encounter: Secondary | ICD-10-CM | POA: Diagnosis not present

## 2023-02-15 DIAGNOSIS — Z794 Long term (current) use of insulin: Secondary | ICD-10-CM | POA: Insufficient documentation

## 2023-02-15 DIAGNOSIS — Z950 Presence of cardiac pacemaker: Secondary | ICD-10-CM | POA: Insufficient documentation

## 2023-02-15 DIAGNOSIS — I130 Hypertensive heart and chronic kidney disease with heart failure and stage 1 through stage 4 chronic kidney disease, or unspecified chronic kidney disease: Secondary | ICD-10-CM | POA: Insufficient documentation

## 2023-02-15 DIAGNOSIS — R1310 Dysphagia, unspecified: Secondary | ICD-10-CM | POA: Diagnosis not present

## 2023-02-15 DIAGNOSIS — I5020 Unspecified systolic (congestive) heart failure: Secondary | ICD-10-CM | POA: Insufficient documentation

## 2023-02-15 DIAGNOSIS — R195 Other fecal abnormalities: Secondary | ICD-10-CM

## 2023-02-15 DIAGNOSIS — R9389 Abnormal findings on diagnostic imaging of other specified body structures: Secondary | ICD-10-CM | POA: Diagnosis not present

## 2023-02-15 DIAGNOSIS — I4892 Unspecified atrial flutter: Secondary | ICD-10-CM | POA: Diagnosis not present

## 2023-02-15 DIAGNOSIS — I5043 Acute on chronic combined systolic (congestive) and diastolic (congestive) heart failure: Secondary | ICD-10-CM | POA: Diagnosis not present

## 2023-02-15 HISTORY — PX: BIOPSY: SHX5522

## 2023-02-15 HISTORY — PX: ESOPHAGOGASTRODUODENOSCOPY (EGD) WITH PROPOFOL: SHX5813

## 2023-02-15 HISTORY — PX: MALONEY DILATION: SHX5535

## 2023-02-15 LAB — BASIC METABOLIC PANEL
Anion gap: 9 (ref 5–15)
BUN: 70 mg/dL — ABNORMAL HIGH (ref 8–23)
CO2: 23 mmol/L (ref 22–32)
Calcium: 10 mg/dL (ref 8.9–10.3)
Chloride: 108 mmol/L (ref 98–111)
Creatinine, Ser: 3.77 mg/dL — ABNORMAL HIGH (ref 0.44–1.00)
GFR, Estimated: 11 mL/min — ABNORMAL LOW (ref >60)
Glucose, Bld: 180 mg/dL — ABNORMAL HIGH (ref 70–99)
Potassium: 3.8 mmol/L (ref 3.5–5.1)
Sodium: 140 mmol/L (ref 135–145)

## 2023-02-15 LAB — CBC
HCT: 27.5 % — ABNORMAL LOW (ref 36.0–46.0)
Hemoglobin: 8.3 g/dL — ABNORMAL LOW (ref 12.0–15.0)
MCH: 28.8 pg (ref 26.0–34.0)
MCHC: 30.2 g/dL (ref 30.0–36.0)
MCV: 95.5 fL (ref 80.0–100.0)
Platelets: 162 10*3/uL (ref 150–400)
RBC: 2.88 MIL/uL — ABNORMAL LOW (ref 3.87–5.11)
RDW: 22.5 % — ABNORMAL HIGH (ref 11.5–15.5)
WBC: 4.1 10*3/uL (ref 4.0–10.5)
nRBC: 0 % (ref 0.0–0.2)

## 2023-02-15 LAB — GLUCOSE, CAPILLARY: Glucose-Capillary: 152 mg/dL — ABNORMAL HIGH (ref 70–99)

## 2023-02-15 SURGERY — ESOPHAGOGASTRODUODENOSCOPY (EGD) WITH PROPOFOL
Anesthesia: Monitor Anesthesia Care

## 2023-02-15 MED ORDER — PROPOFOL 500 MG/50ML IV EMUL
INTRAVENOUS | Status: DC | PRN
  Administered 2023-02-15: 80 ug/kg/min via INTRAVENOUS

## 2023-02-15 MED ORDER — PROPOFOL 1000 MG/100ML IV EMUL
INTRAVENOUS | Status: AC
  Filled 2023-02-15: qty 100

## 2023-02-15 MED ORDER — LIDOCAINE HCL (CARDIAC) PF 100 MG/5ML IV SOSY
PREFILLED_SYRINGE | INTRAVENOUS | Status: DC | PRN
  Administered 2023-02-15: 80 mg via INTRATRACHEAL

## 2023-02-15 MED ORDER — PROPOFOL 10 MG/ML IV BOLUS
INTRAVENOUS | Status: DC | PRN
  Administered 2023-02-15: 70 mg via INTRAVENOUS

## 2023-02-15 MED ORDER — SODIUM CHLORIDE 0.9 % IV SOLN: INTRAVENOUS | Status: DC

## 2023-02-15 MED ORDER — SODIUM CHLORIDE 0.9 % IV SOLN: INTRAVENOUS | Status: DC | PRN

## 2023-02-15 SURGICAL SUPPLY — 14 items

## 2023-02-15 NOTE — Anesthesia Postprocedure Evaluation (Signed)
 Anesthesia Post Note  Patient: Amanda [name]  Procedure(s) Performed: ESOPHAGOGASTRODUODENOSCOPY (EGD) WITH PROPOFOL  MALONEY DILATION BIOPSY     Patient location during evaluation: PACU Anesthesia Type: MAC Level of consciousness: awake and alert Pain management: pain level controlled Vital Signs Assessment: post-procedure vital signs reviewed and stable Respiratory status: spontaneous breathing, nonlabored ventilation, respiratory function stable and patient connected to nasal cannula oxygen  Cardiovascular status: stable and blood pressure returned to baseline Postop Assessment: no apparent nausea or vomiting Anesthetic complications: no   No notable events documented.  Last Vitals:  Vitals:   02/15/23 0830 02/15/23 0840  BP: (!) 126/52   Pulse: 61 61  Resp: 18 15  Temp:    SpO2: 99% 96%    Last Pain:  Vitals:   02/15/23 0830  TempSrc:   PainSc: 0-No pain                 [name]

## 2023-02-15 NOTE — Interval H&P Note (Signed)
 History and Physical Interval Note:  02/15/2023 7:36 AM  Amanda [name]  has presented today for surgery, with the diagnosis of heme positive stool abnormal Ct scan.  The various methods of treatment have been discussed with the patient and family. After consideration of risks, benefits and other options for treatment, the patient has consented to  Procedure(s): ESOPHAGOGASTRODUODENOSCOPY (EGD) WITH PROPOFOL  (N/A) SAVORY DILATION (N/A) as a surgical intervention.  The patient's history has been reviewed, patient examined, no change in status, stable for surgery.  I have reviewed the patient's chart and labs.  Questions were answered to the patient's satisfaction.     [name]

## 2023-02-15 NOTE — Discharge Instructions (Signed)

## 2023-02-15 NOTE — Transfer of Care (Signed)
 Immediate Anesthesia Transfer of Care Note  Patient: Amanda [name]  Procedure(s) Performed: ESOPHAGOGASTRODUODENOSCOPY (EGD) WITH PROPOFOL  MALONEY DILATION BIOPSY  Patient Location: PACU  Anesthesia Type:MAC  Level of Consciousness: drowsy  Airway & Oxygen  Therapy: Patient Spontanous Breathing and Patient connected to face mask oxygen   Post-op Assessment: Report given to RN  Post vital signs: Reviewed and stable  Last Vitals:  Vitals Value Taken Time  BP 100/55 02/15/23 0804  Temp    Pulse 60 02/15/23 0804  Resp 16 02/15/23 0804  SpO2 100 % 02/15/23 0804    Last Pain:  Vitals:   02/15/23 9366  PainSc: 0-No pain         Complications: No notable events documented.

## 2023-02-16 LAB — SURGICAL PATHOLOGY

## 2023-02-16 NOTE — Op Note (Addendum)
 [facility] Patient Name: Amanda [name] Procedure Date: 02/15/2023 MRN: [id number] Attending MD: [name] , MD, [id number] Date of Birth: 1940-12-21 CSN: [id number] Age: 83 Admit Type: Outpatient Procedure:                Upper GI endoscopy Indications:              Chronic anemia -iron deficiency anemia and anemia                            of chronic disease d/t CKD. H+ stools, H/O                            dysphagia. Providers:                [name], MD, Burnard Fire RN, RN, Corene Southgate,                            Technician Referring MD:              Medicines:                Monitored Anesthesia Care Complications:            No immediate complications. Estimated Blood Loss:     Estimated blood loss: none. Procedure:                Pre-Anesthesia Assessment:                           - Prior to the procedure, a History and Physical                            was performed, and patient medications and                            allergies were reviewed. The patient's tolerance of                            previous anesthesia was also reviewed. The risks                            and benefits of the procedure and the sedation                            options and risks were discussed with the patient.                            All questions were answered, and informed consent                            was obtained. Prior Anticoagulants: The patient has                            taken no anticoagulant or antiplatelet agents (off  Eliquis  x 1 month). ASA Grade Assessment: IV - A                            patient with severe systemic disease that is a                            constant threat to life. After reviewing the risks                            and benefits, the patient was deemed in                            satisfactory condition to undergo the procedure.                           After obtaining informed  consent, the endoscope was                            passed under direct vision. Throughout the                            procedure, the patient's blood pressure, pulse, and                            oxygen  saturations were monitored continuously. The                            [id number]-[id number] ([id number]) Olympus endoscope was introduced                            through the mouth, and advanced to the second part                            of duodenum. The upper GI endoscopy was                            accomplished without difficulty. The patient                            tolerated the procedure well. Scope In: Scope Out: Findings:      The examined esophagus was normal. The scope was withdrawn. Dilation was       performed with a Maloney dilator with mild resistance at 50 Fr.      A small hiatal hernia was present.      Diffuse mild mucosal changes characterized by discoloration d/t iron       supplements were found in the entire examined stomach. Some retained       adherent food which did limit exam. No active bleeding. Biopsies were       taken with a cold forceps for histology.      The examined duodenum also showed dark discoloration (likely due to iron       supplements). No active bleeding. Deep duodenal intubation was       performed. Biopsies for histology were taken with a  cold forceps to r/o       celiac disease. Impression:               - Normal esophagus. Dilated.                           - Small hiatal hernia.                           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                           - No active bleeding. Moderate Sedation:      Not Applicable - Patient had care per Anesthesia. Recommendation:           - Patient has a contact number available for                            emergencies. The signs and symptoms of potential                            delayed complications were discussed with the                             patient. Return to normal activities tomorrow.                            Written discharge instructions were provided to the                            patient.                           - Resume previous diet.                           - Continue present medications.                           - Await pathology results.                           - Check CBC, BMP today, as planned.                           - The findings and recommendations were discussed                            with the patient's family. Procedure Code(s):        --- Professional ---                           [phone], Esophagogastroduodenoscopy, flexible,  transoral; with biopsy, single or multiple                           43450, Dilation of esophagus, by unguided sound or                            bougie, single or multiple passes Diagnosis Code(s):        --- Professional ---                           K44.9, Diaphragmatic hernia without obstruction or                            gangrene                           K31.89, Other diseases of stomach and duodenum                           D50.9, Iron deficiency anemia, unspecified CPT copyright 2022 American [employer] Association. All rights reserved. The codes documented in this report are preliminary and upon coder review may  be revised to meet current compliance requirements. [name], MD 02/15/2023 8:08:16 AM This report has been signed electronically. Number of Addenda: 0

## 2023-02-17 ENCOUNTER — Encounter: Payer: Self-pay | Admitting: Gastroenterology

## 2023-02-18 ENCOUNTER — Encounter (HOSPITAL_COMMUNITY): Payer: Self-pay | Admitting: Gastroenterology

## 2023-02-19 DIAGNOSIS — I4891 Unspecified atrial fibrillation: Secondary | ICD-10-CM | POA: Diagnosis not present

## 2023-02-19 DIAGNOSIS — I2510 Atherosclerotic heart disease of native coronary artery without angina pectoris: Secondary | ICD-10-CM | POA: Diagnosis not present

## 2023-02-19 DIAGNOSIS — I5043 Acute on chronic combined systolic (congestive) and diastolic (congestive) heart failure: Secondary | ICD-10-CM | POA: Diagnosis not present

## 2023-02-19 DIAGNOSIS — I700 Atherosclerosis of aorta: Secondary | ICD-10-CM | POA: Diagnosis not present

## 2023-02-19 DIAGNOSIS — D631 Anemia in chronic kidney disease: Secondary | ICD-10-CM | POA: Diagnosis not present

## 2023-02-19 DIAGNOSIS — I110 Hypertensive heart disease with heart failure: Secondary | ICD-10-CM | POA: Diagnosis not present

## 2023-02-19 DIAGNOSIS — M810 Age-related osteoporosis without current pathological fracture: Secondary | ICD-10-CM | POA: Diagnosis not present

## 2023-02-19 DIAGNOSIS — N179 Acute kidney failure, unspecified: Secondary | ICD-10-CM | POA: Diagnosis not present

## 2023-02-19 DIAGNOSIS — E1122 Type 2 diabetes mellitus with diabetic chronic kidney disease: Secondary | ICD-10-CM | POA: Diagnosis not present

## 2023-02-19 DIAGNOSIS — J9601 Acute respiratory failure with hypoxia: Secondary | ICD-10-CM | POA: Diagnosis not present

## 2023-02-19 DIAGNOSIS — I081 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: Secondary | ICD-10-CM | POA: Diagnosis not present

## 2023-02-19 DIAGNOSIS — I252 Old myocardial infarction: Secondary | ICD-10-CM | POA: Diagnosis not present

## 2023-02-19 DIAGNOSIS — N184 Chronic kidney disease, stage 4 (severe): Secondary | ICD-10-CM | POA: Diagnosis not present

## 2023-02-19 DIAGNOSIS — I455 Other specified heart block: Secondary | ICD-10-CM | POA: Diagnosis not present

## 2023-02-19 DIAGNOSIS — G4733 Obstructive sleep apnea (adult) (pediatric): Secondary | ICD-10-CM | POA: Diagnosis not present

## 2023-02-22 DIAGNOSIS — I5043 Acute on chronic combined systolic (congestive) and diastolic (congestive) heart failure: Secondary | ICD-10-CM | POA: Diagnosis not present

## 2023-02-22 DIAGNOSIS — N179 Acute kidney failure, unspecified: Secondary | ICD-10-CM | POA: Diagnosis not present

## 2023-02-22 DIAGNOSIS — G4733 Obstructive sleep apnea (adult) (pediatric): Secondary | ICD-10-CM | POA: Diagnosis not present

## 2023-02-22 DIAGNOSIS — M810 Age-related osteoporosis without current pathological fracture: Secondary | ICD-10-CM | POA: Diagnosis not present

## 2023-02-22 DIAGNOSIS — N184 Chronic kidney disease, stage 4 (severe): Secondary | ICD-10-CM | POA: Diagnosis not present

## 2023-02-22 DIAGNOSIS — I110 Hypertensive heart disease with heart failure: Secondary | ICD-10-CM | POA: Diagnosis not present

## 2023-02-22 DIAGNOSIS — E1122 Type 2 diabetes mellitus with diabetic chronic kidney disease: Secondary | ICD-10-CM | POA: Diagnosis not present

## 2023-02-22 DIAGNOSIS — I2510 Atherosclerotic heart disease of native coronary artery without angina pectoris: Secondary | ICD-10-CM | POA: Diagnosis not present

## 2023-02-22 DIAGNOSIS — I455 Other specified heart block: Secondary | ICD-10-CM | POA: Diagnosis not present

## 2023-02-22 DIAGNOSIS — I252 Old myocardial infarction: Secondary | ICD-10-CM | POA: Diagnosis not present

## 2023-02-22 DIAGNOSIS — I4891 Unspecified atrial fibrillation: Secondary | ICD-10-CM | POA: Diagnosis not present

## 2023-02-22 DIAGNOSIS — D631 Anemia in chronic kidney disease: Secondary | ICD-10-CM | POA: Diagnosis not present

## 2023-02-22 DIAGNOSIS — J9601 Acute respiratory failure with hypoxia: Secondary | ICD-10-CM | POA: Diagnosis not present

## 2023-02-22 DIAGNOSIS — I081 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: Secondary | ICD-10-CM | POA: Diagnosis not present

## 2023-02-22 DIAGNOSIS — I700 Atherosclerosis of aorta: Secondary | ICD-10-CM | POA: Diagnosis not present

## 2023-02-22 NOTE — Progress Notes (Signed)
Remote pacemaker transmission.   

## 2023-03-02 ENCOUNTER — Ambulatory Visit: Payer: Medicare Other | Attending: Cardiology

## 2023-03-02 ENCOUNTER — Telehealth: Payer: Self-pay | Admitting: Cardiology

## 2023-03-02 DIAGNOSIS — I340 Nonrheumatic mitral (valve) insufficiency: Secondary | ICD-10-CM | POA: Diagnosis not present

## 2023-03-02 LAB — ECHOCARDIOGRAM COMPLETE
Area-P 1/2: 3.87 cm2
MV M vel: 6.07 m/s
MV Peak grad: 147.4 mm[Hg]
Radius: 0.7 cm
S' Lateral: 2.6 cm

## 2023-03-02 NOTE — Telephone Encounter (Signed)
Medication updated and reconciled.

## 2023-03-02 NOTE — Telephone Encounter (Signed)
Patient came in office to update her medication list.   Imdur- pt states only taking 60 mg daily- not 120.  2.  Pravastatin 40 mg- only taking 1 every other day  3.  Eliquis- No longer taking

## 2023-03-03 ENCOUNTER — Telehealth: Payer: Self-pay

## 2023-03-03 DIAGNOSIS — N185 Chronic kidney disease, stage 5: Secondary | ICD-10-CM | POA: Diagnosis not present

## 2023-03-03 DIAGNOSIS — I110 Hypertensive heart disease with heart failure: Secondary | ICD-10-CM | POA: Diagnosis not present

## 2023-03-03 DIAGNOSIS — E1122 Type 2 diabetes mellitus with diabetic chronic kidney disease: Secondary | ICD-10-CM | POA: Diagnosis not present

## 2023-03-03 DIAGNOSIS — I340 Nonrheumatic mitral (valve) insufficiency: Secondary | ICD-10-CM | POA: Diagnosis not present

## 2023-03-03 NOTE — Telephone Encounter (Signed)
Left message on My Chart with Echo results per Dr. Vanetta Shawl note. Routed to PCP.

## 2023-03-04 ENCOUNTER — Telehealth: Payer: Self-pay

## 2023-03-04 NOTE — Telephone Encounter (Signed)
Pt viewed Echo results on My Chart per Dr. Vanetta Shawl note. Routed to PCP.

## 2023-03-11 ENCOUNTER — Other Ambulatory Visit: Payer: Self-pay | Admitting: Cardiology

## 2023-03-11 NOTE — Telephone Encounter (Signed)
 Medication sent using instructions on office visit 02/09/2023.

## 2023-03-12 ENCOUNTER — Ambulatory Visit: Payer: Medicare Other | Attending: Cardiology | Admitting: Cardiology

## 2023-03-12 ENCOUNTER — Encounter: Payer: Self-pay | Admitting: Cardiology

## 2023-03-12 VITALS — BP 142/92 | HR 90 | Ht 63.0 in | Wt 165.0 lb

## 2023-03-12 DIAGNOSIS — I5032 Chronic diastolic (congestive) heart failure: Secondary | ICD-10-CM

## 2023-03-12 DIAGNOSIS — I480 Paroxysmal atrial fibrillation: Secondary | ICD-10-CM | POA: Diagnosis not present

## 2023-03-12 DIAGNOSIS — E785 Hyperlipidemia, unspecified: Secondary | ICD-10-CM

## 2023-03-12 DIAGNOSIS — G4733 Obstructive sleep apnea (adult) (pediatric): Secondary | ICD-10-CM | POA: Diagnosis not present

## 2023-03-12 DIAGNOSIS — I340 Nonrheumatic mitral (valve) insufficiency: Secondary | ICD-10-CM

## 2023-03-12 DIAGNOSIS — M810 Age-related osteoporosis without current pathological fracture: Secondary | ICD-10-CM | POA: Diagnosis not present

## 2023-03-12 NOTE — Progress Notes (Signed)
 Cardiology Office Note:    Date:  03/12/2023   ID:  Amanda [name], DOB 1940/04/30, MRN [id number]  PCP:  Street, Lonni HERO, MD  Cardiologist:  Lamar Fitch, MD    Referring MD: Street, Lonni HERO, *   Chief Complaint  Patient presents with   Follow-up    History of Present Illness:    Amanda [name] is a 83 y.o. female past medical history significant for persistent atrial fibrillation now atrial flutter, status post Watchman device, chronic kidney failure with treated for slowest 11, coronary artery disease, complete heart block pacemaker, obstructive sleep apnea, diabetes, dyslipidemia, moderate to severe mitral regurgitation.  Comes today to months for follow-up issue was GI bleeding.  Gastroscopy colonoscopy done it looks like she told me that there were no source of bleeding identified.  But she still mildly anemic not a candidate for anticoagulation right now the idea was to anticoagulate her and then perform atrial fibrillation ablation but now with her anemia still happening I cannot do it we will check CBC today.  Also concerns about her mitral regurgitation she began getting better benefit from mitral valve clip at the same time she does have quite significant kidney dysfunction situation is very complicated  Past Medical History:  Diagnosis Date   (HFpEF) heart failure with preserved ejection fraction (HCC) 12/13/2022   Abnormal nuclear stress test 06/23/2016   Overview:   Added automatically from request for surgery [id number]     Acute ischemic stroke (HCC)    Acute kidney failure, unspecified (HCC) 02/02/2022   Acute metabolic encephalopathy    Acute on chronic combined systolic and diastolic hrt fail (HCC) 12/16/2022   Acute on chronic systolic (congestive) heart failure (HCC)    Acute renal insufficiency 09/02/2016   Acute respiratory failure with hypoxia (HCC) 12/16/2022   Age-related osteoporosis without current pathological fracture 02/02/2022   AKI  (acute kidney injury) (HCC) 09/01/2016   Anemia due to stage 4 chronic kidney disease (HCC) 04/04/2015   Anemia in chronic kidney disease 02/02/2022   Atherosclerosis of aorta (HCC) 02/02/2022   Athscl heart disease of native coronary artery w/o ang pctrs 02/02/2022   Atrial fibrillation (HCC)    Atrial fibrillation with RVR (HCC) 12/06/2022   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) 04/04/2015   Bradycardia 09/17/2014   Chest pain 06/23/2016   Overview:  Added automatically from request for surgery [id number]   Chronic anemia    Chronic kidney disease (CKD), stage III (moderate) (HCC)    Chronic kidney disease, stage 4 (severe) (HCC) 02/02/2022   Chronic systolic CHF (congestive heart failure) (HCC)    Closed left hip fracture (HCC) 03/23/2021   Constipation 03/28/2021   Constipation, unspecified 02/02/2022   Coronary artery disease 10/12/2016   Non drug-eluting stent implanted in June 2018 to mid RCA   08/08/2016 10:37  Angiographic Findings  Cardiac Arteries and Lesion Findings LMCA: 0% and Normal. LAD: 0% and Normal. RCA:   Lesion on Mid RCA: Mid subsection.95% stenosis reduced to 0%. Pre procedure   TIMI II flow was noted. Post Procedure TIMI III flow was present. Poor run   off was present. The lesion was diagnosed as High Risk (C).    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) 04/04/2015   Diabetes mellitus without complication (HCC)    Diverticulosis    Dyslipidemia 09/17/2014   Gastro-esophageal reflux disease without esophagitis 02/02/2022   Gastroesophageal reflux    GI bleed  08/06/2016   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   History of cardiac monitoring 02/23/2018   monitor inserted   History of falling 02/02/2022   History of iron deficiency anemia 12/28/2017   History of transesophageal echocardiography (TEE)    Hypercalcemia 04/04/2015   Hyperlipidemia    Hyperlipidemia, unspecified 02/02/2022   Hypertensive  disorder 07/21/2017   Hypertensive heart disease with heart failure (HCC)    Hypothyroidism, unspecified 02/02/2022   Iron deficiency anemia 12/28/2017   Iron deficiency anemia, unspecified 02/02/2022   Ischemic cardiomyopathy 02/02/2022   Kidney disease 07/21/2017   Long term (current) use of insulin  (HCC) 02/02/2022   LV dysfunction 09/02/2016   Mitral regurgitation severe based on TEE from 2022 April 06/10/2020   Myocardial infarction [facility])    NSTEMI (non-ST elevated myocardial infarction) (HCC) 08/06/2016   Obstructive sleep apnea 09/17/2014   Obstructive sleep apnea (adult) (pediatric) 02/02/2022   Old myocardial infarction 02/02/2022   Orthostatic hypotension 12/28/2017   OSA on CPAP    Other specified heart block 02/02/2022   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   Paroxysmal atrial flutter (HCC) 07/22/2022   Personal history of other diseases of the digestive system 02/02/2022   Postural dizziness with presyncope 09/23/2017   Presence of cardiac pacemaker 02/02/2022   Presence of other cardiac implants and grafts 02/02/2022   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/24/2017   Prsnl hx of TIA (TIA), and cereb infrc w/o resid deficits 02/02/2022   Recurrent left pleural effusion 03/14/2015   Renal failure, chronic, stage 3 (moderate) (HCC)    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/2022   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   Secondary hypercoagulable state (HCC) 04/23/2021   Syncope 11/04/2017   Systolic heart failure (HCC)    Thyroid  disease    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   Unspecified atrial fibrillation (HCC) 12/16/2022   Vascular disorder of intestine, unspecified (HCC) 02/02/2022   Vitamin D  deficiency 04/04/2015    Past Surgical History:  Procedure Laterality Date   BIOPSY  02/15/2023   Procedure: BIOPSY;  Surgeon: Charlanne Groom, MD;  Location: [facility];   Service: Gastroenterology;;   VASSIE STUDY  05/16/2020   Procedure: BUBBLE STUDY;  Surgeon: Loni Soyla LABOR, MD;  Location: [facility] ENDOSCOPY;  Service: Cardiovascular;;   CARDIAC CATHETERIZATION     CARDIOVERSION N/A 05/16/2020   Procedure: CARDIOVERSION;  Surgeon: Loni Soyla LABOR, MD;  Location: [facility] ENDOSCOPY;  Service: Cardiovascular;  Laterality: N/A;   CARDIOVERSION N/A 09/06/2020   Procedure: CARDIOVERSION;  Surgeon: Kate Lonni CROME, MD;  Location: [facility] ENDOSCOPY;  Service: Cardiovascular;  Laterality: N/A;   CARDIOVERSION N/A 05/16/2021   Procedure: CARDIOVERSION;  Surgeon: Shlomo Wilbert SAUNDERS, MD;  Location: [facility] ENDOSCOPY;  Service: Cardiovascular;  Laterality: N/A;   CARDIOVERSION N/A 08/12/2022   Procedure: CARDIOVERSION;  Surgeon: Sheena Pugh, DO;  Location: [facility] INVASIVE CV LAB;  Service: Cardiovascular;  Laterality: N/A;   CARDIOVERSION N/A 09/30/2022   Procedure: CARDIOVERSION;  Surgeon: Lonni Slain, MD;  Location: [facility] INVASIVE CV LAB;  Service: Cardiovascular;  Laterality: N/A;   CARDIOVERSION N/A 12/08/2022   Procedure: CARDIOVERSION (CATH LAB);  Surgeon: Santo Stanly LABOR, MD;  Location: [facility] INVASIVE CV LAB;  Service: Cardiovascular;  Laterality: N/A;   CARDIOVERSION N/A 12/14/2022   Procedure: CARDIOVERSION (CATH LAB);  Surgeon: Lonni Slain, MD;  Location: [facility] INVASIVE CV LAB;  Service: Cardiovascular;  Laterality: N/A;   COLONOSCOPY  11/21/2014  Moderate predominantly sigmoid diverticulosis. Otherwise noraml collonscopy to TI.    CORONARY ANGIOPLASTY WITH STENT PLACEMENT  08/2016   ESOPHAGOGASTRODUODENOSCOPY (EGD) WITH PROPOFOL  N/A 02/15/2023   Procedure: ESOPHAGOGASTRODUODENOSCOPY (EGD) WITH PROPOFOL ;  Surgeon: Charlanne Groom, MD;  Location: [facility] ENDOSCOPY;  Service: Gastroenterology;  Laterality: N/A;   EXCISIONAL HEMORRHOIDECTOMY     LEFT ATRIAL APPENDAGE OCCLUSION  11/2016   in [place]   LOOP RECORDER INSERTION N/A 02/23/2018   Procedure: LOOP  RECORDER INSERTION;  Surgeon: Inocencio Soyla Lunger, MD;  Location: [facility] INVASIVE CV LAB;  Service: Cardiovascular;  Laterality: N/A;   LOOP RECORDER REMOVAL N/A 04/24/2019   Procedure: LOOP RECORDER REMOVAL;  Surgeon: Inocencio Soyla Lunger, MD;  Location: [facility] INVASIVE CV LAB;  Service: Cardiovascular;  Laterality: N/A;   MALONEY DILATION N/A 02/15/2023   Procedure: AGAPITO DILATION;  Surgeon: Charlanne Groom, MD;  Location: [facility] ENDOSCOPY;  Service: Gastroenterology;  Laterality: N/A;   NOSE SURGERY     PACEMAKER IMPLANT N/A 04/24/2019   Procedure: PACEMAKER IMPLANT;  Surgeon: Inocencio Soyla Lunger, MD;  Location: [facility] INVASIVE CV LAB;  Service: Cardiovascular;  Laterality: N/A;   TEE WITH CARDIOVERSION     TEE WITHOUT CARDIOVERSION N/A 05/16/2020   Procedure: TRANSESOPHAGEAL ECHOCARDIOGRAM (TEE);  Surgeon: Loni Soyla LABOR, MD;  Location: [facility] ENDOSCOPY;  Service: Cardiovascular;  Laterality: N/A;   TEE WITHOUT CARDIOVERSION N/A 08/12/2022   Procedure: TRANSESOPHAGEAL ECHOCARDIOGRAM;  Surgeon: Sheena Pugh, DO;  Location: [facility] INVASIVE CV LAB;  Service: Cardiovascular;  Laterality: N/A;   TEE WITHOUT CARDIOVERSION N/A 09/30/2022   Procedure: TRANSESOPHAGEAL ECHOCARDIOGRAM;  Surgeon: Lonni Slain, MD;  Location: [facility] INVASIVE CV LAB;  Service: Cardiovascular;  Laterality: N/A;   TOTAL HIP REVISION Left 03/25/2021   Procedure: LEFT POSTERIOR TOTAL HIP  ZIMMER CABLES;  Surgeon: Ernie Cough, MD;  Location: [facility] ORS;  Service: Orthopedics;  Laterality: Left;    Current Medications: Current Meds  Medication Sig   amLODipine  (NORVASC ) 10 MG tablet Take 10 mg by mouth daily.   Biotin 1000 MCG tablet Take 1,000 mcg by mouth daily.   blood glucose meter kit and supplies KIT Dispense based on patient and insurance preference. Use up to four times daily as directed. (Patient taking differently: Inject 1 each into the skin See admin instructions. Dispense based on patient and insurance preference. Use up to  four times daily as directed.)   carboxymethylcellul-glycerin (REFRESH RELIEVA) 0.5-0.9 % ophthalmic solution Place 1 drop into both eyes 4 (four) times daily as needed for dry eyes. VE   Cholecalciferol  (VITAMIN D3) 50 MCG (2000 UT) TABS Take 2,000 Units by mouth daily at 12 noon.   cloNIDine  (CATAPRES ) 0.1 MG tablet Take 0.1 mg by mouth 2 (two) times daily.   Coenzyme Q10 100 MG capsule Take 100 mg by mouth 2 (two) times daily.   denosumab  (PROLIA ) 60 MG/ML SOSY injection Inject 60 mg into the skin every 6 (six) months.   estradiol  (ESTRACE ) 0.1 MG/GM vaginal cream Place 1 Applicatorful vaginally every Monday, Wednesday, and Friday.    Evolocumab  (REPATHA  SURECLICK) 140 MG/ML SOAJ Inject 140 mg into the skin every 14 (fourteen) days.   famotidine  (PEPCID ) 10 MG tablet Take 1 tablet (10 mg total) by mouth daily.   ferrous sulfate  325 (65 FE) MG EC tablet Take 325 mg by mouth 2 (two) times daily with breakfast and lunch.   folic acid  (FOLVITE ) 800 MCG tablet Take 800 mcg by mouth daily at 12 noon.   Glucosamine-Chondroitin (OSTEO BI-FLEX REGULAR STRENGTH PO) Take  25 mcg by mouth daily at 12 noon. Plus D3   HUMALOG KWIKPEN 100 UNIT/ML KwikPen Inject 2-6 Units into the skin as directed. Per sliding scale   hydrALAZINE  (APRESOLINE ) 50 MG tablet Take 1 tablet (50 mg total) by mouth every 6 (six) hours.   isosorbide  mononitrate (IMDUR ) 60 MG 24 hr tablet Take 60 mg by mouth daily.   Lactobacillus (FLORAJEN ACIDOPHILUS) CAPS Take 1 capsule by mouth daily at 12 noon. 390 mg each   levothyroxine  (SYNTHROID , LEVOTHROID) 50 MCG tablet Take 50 mcg by mouth daily before breakfast.   metoprolol  tartrate (LOPRESSOR ) 25 MG tablet Take 25 mg by mouth 2 (two) times daily.   Multiple Vitamins-Minerals (PRESERVISION AREDS 2) CAPS Take 1 capsule by mouth 2 (two) times daily.   nitroGLYCERIN  (NITROSTAT ) 0.4 MG SL tablet Place 0.4 mg under the tongue every 5 (five) minutes as needed for chest pain.   omega-3 acid  ethyl esters (LOVAZA ) 1 g capsule Take 2 capsules (2 g total) by mouth daily.   pantoprazole  (PROTONIX ) 40 MG tablet Take 1 tablet (40 mg total) by mouth daily.   pravastatin  (PRAVACHOL ) 40 MG tablet Take 40 mg by mouth every other day.   ranolazine  (RANEXA ) 500 MG 12 hr tablet Take 1 tablet (500 mg total) by mouth 2 (two) times daily.   torsemide  (DEMADEX ) 20 MG tablet Take 20 mg by mouth 2 (two) times daily.   vitamin B-12 (CYANOCOBALAMIN ) 1000 MCG tablet Take 1,000 mcg by mouth in the morning and at bedtime.   Wheat Dextrin (BENEFIBER PO) Take 3 tablets by mouth daily.   [DISCONTINUED] amiodarone  (PACERONE ) 200 MG tablet Take 200 mg by mouth 2 (two) times daily.   [DISCONTINUED] sulfamethoxazole-trimethoprim (BACTRIM DS) 800-160 MG tablet Take 1 tablet by mouth 2 (two) times daily.     Allergies:   Ciprofloxacin, Contrast media [iodinated contrast media], Crestor [rosuvastatin], Lipitor [atorvastatin], Nsaids, and Statins   Social History   Socioeconomic History   Marital status: Married    Spouse name: Not on file   Number of children: 2   Years of education: Not on file   Highest education level: Not on file  Occupational History   Occupation: Retired   Tobacco Use   Smoking status: Former   Smokeless tobacco: Former    Quit date: 1997   Tobacco comments:    Former smoker 05/22/21  Vaping Use   Vaping status: Never Used  Substance and Sexual Activity   Alcohol use: Yes    Alcohol/week: 2.0 standard drinks of alcohol    Types: 2 Standard drinks or equivalent per week    Comment: Occ   Drug use: No   Sexual activity: Not on file  Other Topics Concern   Not on file  Social History Narrative   Watchman device 2018   Social Drivers of Health   Financial Resource Strain: Not on file  Food Insecurity: No Food Insecurity (12/07/2022)   Hunger Vital Sign    Worried About Running Out of Food in the Last Year: Never true    Ran Out of Food in the Last Year: Never true   Transportation Needs: No Transportation Needs (12/07/2022)   PRAPARE - Administrator, Civil Service (Medical): No    Lack of Transportation (Non-Medical): No  Physical Activity: Not on file  Stress: Not on file  Social Connections: Not on file     Family History: The patient's family history includes Breast cancer in her mother; Cancer in  her maternal grandfather; Diabetes in her sister; Heart attack in her brother, maternal uncle, and sister; Hypertension in her father; Prostate cancer in her father; Rectal cancer in her maternal uncle; Stroke in her father and maternal uncle. There is no history of Esophageal cancer, Pancreatic cancer, or Stomach cancer. ROS:   Please see the history of present illness.    All 14 point review of systems negative except as described per history of present illness  EKGs/Labs/Other Studies Reviewed:    EKG Interpretation Date/Time:  Friday March 12 2023 12:45:17 EST Ventricular Rate:  89 PR Interval:    QRS Duration:  108 QT Interval:  386 QTC Calculation: 469 R Axis:   218  Text Interpretation: Atrial flutter with variable A-V block with frequent ventricular-paced complexes Right superior axis deviation Pulmonary disease pattern Nonspecific ST abnormality Abnormal ECG When compared with ECG of 09-Feb-2023 15:10, Vent. rate has increased BY  25 BPM Confirmed by Sonoma Firkus [phone]) on 03/12/2023 12:58:56 PM    Recent Labs: 10/15/2022: NT-Pro BNP 4,009 12/06/2022: B Natriuretic Peptide 1,011.0; TSH 2.510 12/14/2022: Magnesium 2.3 01/18/2023: ALT 27 02/15/2023: BUN 70; Creatinine, Ser 3.77; Hemoglobin 8.3; Platelets 162; Potassium 3.8; Sodium 140  Recent Lipid Panel    Component Value Date/Time   CHOL 125 09/05/2019 0842   TRIG 162 (H) 09/05/2019 0842   HDL 59 09/05/2019 0842   CHOLHDL 2.1 09/05/2019 0842   LDLCALC 39 09/05/2019 0842    Physical Exam:    VS:  BP (!) 142/92 (BP Location: Right Arm, Patient Position: Sitting)    Pulse 90   Ht 5' 3 (1.6 m)   Wt 165 lb (74.8 kg)   SpO2 97%   BMI 29.23 kg/m     Wt Readings from Last 3 Encounters:  03/12/23 165 lb (74.8 kg)  02/15/23 169 lb 1.5 oz (76.7 kg)  02/09/23 169 lb (76.7 kg)     GEN:  Well nourished, well developed in no acute distress HEENT: Normal NECK: No JVD; No carotid bruits LYMPHATICS: No lymphadenopathy CARDIAC: Irregular, no murmurs, no rubs, no gallops RESPIRATORY:  Clear to auscultation without rales, wheezing or rhonchi  ABDOMEN: Soft, non-tender, non-distended MUSCULOSKELETAL:  No edema; No deformity  SKIN: Warm and dry LOWER EXTREMITIES: no swelling NEUROLOGIC:  Alert and oriented x 3 PSYCHIATRIC:  Normal affect   ASSESSMENT:    1. Paroxysmal atrial fibrillation (HCC)   2. Chronic heart failure with preserved ejection fraction (HCC)   3. OSA on CPAP   4. Dyslipidemia   5. Nonrheumatic mitral valve regurgitation    PLAN:    In order of problems listed above:  Atrial fibrillation today she is in atrial flutter not anticoagulated but does have Watchman device. Congestive heart failure appears to be compensated we will continue present management. Obstructive sleep apnea on CPAP.  Continue. Dyslipidemia I did review K PN which show me total cholesterol of 132 HDL 53.  Will continue monitoring. Anemia I did review K PN which show me her hemoglobin of 8.3 from 13 of this month will recheck CBC today. Chronic kidney failure I see last creatinine 2.7 from 02/15/2023 will recheck  Overall complex situation I will see her back in my office in about 2 months if everything is stable will consider potentially mitral valve clip or potentially atrial fibrillation ablation   Medication Adjustments/Labs and Tests Ordered: Current medicines are reviewed at length with the patient today.  Concerns regarding medicines are outlined above.  Orders Placed This Encounter  Procedures  EKG 12-Lead   Medication changes: No orders of the  defined types were placed in this encounter.   Signed, Lamar DOROTHA Fitch, MD, [facility] 03/12/2023 1:09 PM    [facility]

## 2023-03-12 NOTE — Patient Instructions (Signed)
 Medication Instructions:  Your physician recommends that you continue on your current medications as directed. Please refer to the Current Medication list given to you today.  *If you need a refill on your cardiac medications before your next appointment, please call your pharmacy*   Lab Work: BMP, CBC- today If you have labs (blood work) drawn today and your tests are completely normal, you will receive your results only by: MyChart Message (if you have MyChart) OR A paper copy in the mail If you have any lab test that is abnormal or we need to change your treatment, we will call you to review the results.   Testing/Procedures: None Ordered   Follow-Up: At [facility], you and your health needs are our priority.  As part of our continuing mission to provide you with exceptional heart care, we have created designated Provider Care Teams.  These Care Teams include your primary Cardiologist (physician) and Advanced Practice Providers (APPs -  Physician Assistants and Nurse Practitioners) who all work together to provide you with the care you need, when you need it.  We recommend signing up for the patient portal called MyChart.  Sign up information is provided on this After Visit Summary.  MyChart is used to connect with patients for Virtual Visits (Telemedicine).  Patients are able to view lab/test results, encounter notes, upcoming appointments, etc.  Non-urgent messages can be sent to your provider as well.   To learn more about what you can do with MyChart, go to [email or web address].    Your next appointment:   2 month(s)  The format for your next appointment:   In Person  Provider:   Lamar Fitch, MD    Other Instructions NA

## 2023-03-12 NOTE — Addendum Note (Signed)
 Addended by: Shawnee Dellen D on: 03/12/2023 01:17 PM   Modules accepted: Orders

## 2023-03-13 LAB — CBC
Hematocrit: 34.7 % (ref 34.0–46.6)
Hemoglobin: 10.8 g/dL — ABNORMAL LOW (ref 11.1–15.9)
MCH: 29.8 pg (ref 26.6–33.0)
MCHC: 31.1 g/dL — ABNORMAL LOW (ref 31.5–35.7)
MCV: 96 fL (ref 79–97)
Platelets: 196 10*3/uL (ref 150–450)
RBC: 3.63 x10E6/uL — ABNORMAL LOW (ref 3.77–5.28)
RDW: 16.4 % — ABNORMAL HIGH (ref 11.7–15.4)
WBC: 6.4 10*3/uL (ref 3.4–10.8)

## 2023-03-13 LAB — BASIC METABOLIC PANEL
BUN/Creatinine Ratio: 16 (ref 12–28)
BUN: 40 mg/dL — ABNORMAL HIGH (ref 8–27)
CO2: 28 mmol/L (ref 20–29)
Calcium: 11.1 mg/dL — ABNORMAL HIGH (ref 8.7–10.3)
Chloride: 99 mmol/L (ref 96–106)
Creatinine, Ser: 2.5 mg/dL — ABNORMAL HIGH (ref 0.57–1.00)
Glucose: 152 mg/dL — ABNORMAL HIGH (ref 70–99)
Potassium: 3.8 mmol/L (ref 3.5–5.2)
Sodium: 145 mmol/L — ABNORMAL HIGH (ref 134–144)
eGFR: 19 mL/min/{1.73_m2} — ABNORMAL LOW (ref >59)

## 2023-03-16 ENCOUNTER — Telehealth: Payer: Self-pay | Admitting: Pharmacy Technician

## 2023-03-16 ENCOUNTER — Telehealth: Payer: Self-pay | Admitting: Cardiology

## 2023-03-16 ENCOUNTER — Other Ambulatory Visit (HOSPITAL_COMMUNITY): Payer: Self-pay

## 2023-03-16 NOTE — Telephone Encounter (Signed)
Sent information from Prior Auth team to pt

## 2023-03-16 NOTE — Telephone Encounter (Signed)
Pt called in stating she needs refill for Repatha but was told she needs PA before. Please advise.

## 2023-03-16 NOTE — Telephone Encounter (Signed)
     Per test claim it is too soon until 04/01/23 also

## 2023-03-17 ENCOUNTER — Telehealth: Payer: Self-pay

## 2023-03-17 NOTE — Telephone Encounter (Signed)
Left message on My Chart with normal lab results per Dr. Vanetta Shawl note. Routed to PCP.

## 2023-03-22 DIAGNOSIS — N3091 Cystitis, unspecified with hematuria: Secondary | ICD-10-CM | POA: Diagnosis not present

## 2023-03-22 DIAGNOSIS — N3000 Acute cystitis without hematuria: Secondary | ICD-10-CM | POA: Diagnosis not present

## 2023-03-29 ENCOUNTER — Ambulatory Visit: Payer: Medicare Other | Admitting: Cardiology

## 2023-03-29 DIAGNOSIS — N184 Chronic kidney disease, stage 4 (severe): Secondary | ICD-10-CM | POA: Diagnosis not present

## 2023-03-29 DIAGNOSIS — I129 Hypertensive chronic kidney disease with stage 1 through stage 4 chronic kidney disease, or unspecified chronic kidney disease: Secondary | ICD-10-CM | POA: Diagnosis not present

## 2023-03-31 DIAGNOSIS — N185 Chronic kidney disease, stage 5: Secondary | ICD-10-CM | POA: Diagnosis not present

## 2023-03-31 DIAGNOSIS — I120 Hypertensive chronic kidney disease with stage 5 chronic kidney disease or end stage renal disease: Secondary | ICD-10-CM | POA: Diagnosis not present

## 2023-03-31 DIAGNOSIS — E1122 Type 2 diabetes mellitus with diabetic chronic kidney disease: Secondary | ICD-10-CM | POA: Diagnosis not present

## 2023-03-31 DIAGNOSIS — M898X9 Other specified disorders of bone, unspecified site: Secondary | ICD-10-CM | POA: Diagnosis not present

## 2023-04-19 ENCOUNTER — Ambulatory Visit (INDEPENDENT_AMBULATORY_CARE_PROVIDER_SITE_OTHER): Payer: Medicare Other

## 2023-04-19 DIAGNOSIS — I480 Paroxysmal atrial fibrillation: Secondary | ICD-10-CM

## 2023-04-20 LAB — CUP PACEART REMOTE DEVICE CHECK
Battery Remaining Longevity: 130 mo
Battery Voltage: 3.01 V
Brady Statistic RA Percent Paced: 0.92 %
Brady Statistic RV Percent Paced: 49.14 %
Date Time Interrogation Session: 20250317101847
Implantable Lead Connection Status: 753985
Implantable Lead Connection Status: 753985
Implantable Lead Implant Date: 20210322
Implantable Lead Implant Date: 20210322
Implantable Lead Location: 753859
Implantable Lead Location: 753860
Implantable Lead Model: 5076
Implantable Lead Model: 5076
Implantable Pulse Generator Implant Date: 20210322
Lead Channel Impedance Value: 266 Ohm
Lead Channel Impedance Value: 304 Ohm
Lead Channel Impedance Value: 361 Ohm
Lead Channel Impedance Value: 437 Ohm
Lead Channel Pacing Threshold Amplitude: 0.625 V
Lead Channel Pacing Threshold Amplitude: 0.875 V
Lead Channel Pacing Threshold Pulse Width: 0.4 ms
Lead Channel Pacing Threshold Pulse Width: 0.4 ms
Lead Channel Sensing Intrinsic Amplitude: 3.25 mV
Lead Channel Sensing Intrinsic Amplitude: 3.25 mV
Lead Channel Sensing Intrinsic Amplitude: 8.75 mV
Lead Channel Sensing Intrinsic Amplitude: 8.75 mV
Lead Channel Setting Pacing Amplitude: 1.5 V
Lead Channel Setting Pacing Amplitude: 2.5 V
Lead Channel Setting Pacing Pulse Width: 0.4 ms
Lead Channel Setting Sensing Sensitivity: 2 mV
Zone Setting Status: 755011
Zone Setting Status: 755011

## 2023-04-23 DIAGNOSIS — H43813 Vitreous degeneration, bilateral: Secondary | ICD-10-CM | POA: Diagnosis not present

## 2023-04-23 DIAGNOSIS — H353231 Exudative age-related macular degeneration, bilateral, with active choroidal neovascularization: Secondary | ICD-10-CM | POA: Diagnosis not present

## 2023-04-23 DIAGNOSIS — H353132 Nonexudative age-related macular degeneration, bilateral, intermediate dry stage: Secondary | ICD-10-CM | POA: Diagnosis not present

## 2023-04-23 DIAGNOSIS — E113213 Type 2 diabetes mellitus with mild nonproliferative diabetic retinopathy with macular edema, bilateral: Secondary | ICD-10-CM | POA: Diagnosis not present

## 2023-04-26 ENCOUNTER — Other Ambulatory Visit: Payer: Self-pay | Admitting: Cardiology

## 2023-05-06 DIAGNOSIS — E039 Hypothyroidism, unspecified: Secondary | ICD-10-CM | POA: Diagnosis not present

## 2023-05-06 DIAGNOSIS — D500 Iron deficiency anemia secondary to blood loss (chronic): Secondary | ICD-10-CM | POA: Diagnosis not present

## 2023-05-06 DIAGNOSIS — G720 Drug-induced myopathy: Secondary | ICD-10-CM | POA: Diagnosis not present

## 2023-05-06 DIAGNOSIS — Z79899 Other long term (current) drug therapy: Secondary | ICD-10-CM | POA: Diagnosis not present

## 2023-05-06 DIAGNOSIS — E559 Vitamin D deficiency, unspecified: Secondary | ICD-10-CM | POA: Diagnosis not present

## 2023-05-06 DIAGNOSIS — N185 Chronic kidney disease, stage 5: Secondary | ICD-10-CM | POA: Diagnosis not present

## 2023-05-06 DIAGNOSIS — M1A09X0 Idiopathic chronic gout, multiple sites, without tophus (tophi): Secondary | ICD-10-CM | POA: Diagnosis not present

## 2023-05-06 DIAGNOSIS — E785 Hyperlipidemia, unspecified: Secondary | ICD-10-CM | POA: Diagnosis not present

## 2023-05-06 DIAGNOSIS — E1122 Type 2 diabetes mellitus with diabetic chronic kidney disease: Secondary | ICD-10-CM | POA: Diagnosis not present

## 2023-05-10 ENCOUNTER — Ambulatory Visit: Payer: Medicare Other | Attending: Cardiology | Admitting: Cardiology

## 2023-05-10 ENCOUNTER — Encounter: Payer: Self-pay | Admitting: Cardiology

## 2023-05-10 VITALS — BP 132/76 | HR 90 | Ht 63.0 in | Wt 159.2 lb

## 2023-05-10 DIAGNOSIS — I340 Nonrheumatic mitral (valve) insufficiency: Secondary | ICD-10-CM

## 2023-05-10 DIAGNOSIS — I480 Paroxysmal atrial fibrillation: Secondary | ICD-10-CM

## 2023-05-10 DIAGNOSIS — E1122 Type 2 diabetes mellitus with diabetic chronic kidney disease: Secondary | ICD-10-CM

## 2023-05-10 DIAGNOSIS — N184 Chronic kidney disease, stage 4 (severe): Secondary | ICD-10-CM | POA: Diagnosis not present

## 2023-05-10 MED ORDER — AMIODARONE HCL 200 MG PO TABS
100.0000 mg | ORAL_TABLET | Freq: Every day | ORAL | 0 refills | Status: DC
Start: 2023-05-10 — End: 2023-11-08

## 2023-05-10 NOTE — Patient Instructions (Signed)
 Medication Instructions:   DECREASE: Amiodarone to 100mg  daily   Lab Work: None Ordered If you have labs (blood work) drawn today and your tests are completely normal, you will receive your results only by: MyChart Message (if you have MyChart) OR A paper copy in the mail If you have any lab test that is abnormal or we need to change your treatment, we will call you to review the results.   Testing/Procedures: None Ordered   Follow-Up: At [facility], you and your health needs are our priority.  As part of our continuing mission to provide you with exceptional heart care, we have created designated Provider Care Teams.  These Care Teams include your primary Cardiologist (physician) and Advanced Practice Providers (APPs -  Physician Assistants and Nurse Practitioners) who all work together to provide you with the care you need, when you need it.  We recommend signing up for the patient portal called "MyChart".  Sign up information is provided on this After Visit Summary.  MyChart is used to connect with patients for Virtual Visits (Telemedicine).  Patients are able to view lab/test results, encounter notes, upcoming appointments, etc.  Non-urgent messages can be sent to your provider as well.   To learn more about what you can do with MyChart, go to [email or web address].    Your next appointment:   3 month(s)  The format for your next appointment:   In Person  Provider:   Gypsy Balsam, MD    Other Instructions NA

## 2023-05-10 NOTE — Addendum Note (Signed)
 Addended by: Baldo Ash D on: 05/10/2023 11:04 AM   Modules accepted: Orders

## 2023-05-10 NOTE — Progress Notes (Signed)
 Cardiology Office Note:    Date:  05/10/2023   ID:  Amanda [name], DOB 02-Jul-1940, MRN [id number]  PCP:  Street, Amanda Coup, MD  Cardiologist:  Gypsy Balsam, MD    Referring MD: Street, Amanda [name], *   Chief Complaint  Patient presents with   Follow-up    History of Present Illness:    Amanda [name] is a 83 y.o. female past medical history significant for persistent atrial flutter now probably permanent, status post Watchman device, chronic kidney failure, coronary artery disease, complete heart block with a pacemaker, obstructive sleep apnea, diabetes, dyslipidemia, moderate to severe mitral valve regurgitation.  She comes today 2 months for follow-up she said more or less she is doing fine.  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  She was getting ready to have atrial flutter/fibrillation ablation however that was canceled eventually after she developed GI bleed even though extensive evaluation has been done we cannot determine exactly what the source of bleeding is.  She described to have some swelling of lower extremities sometimes in the evening time  Past Medical History:  Diagnosis Date   (HFpEF) heart failure with preserved ejection fraction (HCC) 12/13/2022   Abnormal nuclear stress test 06/23/2016   Overview:   Added automatically from request for surgery [id number]     Acute ischemic stroke [facility])    Acute kidney failure, unspecified (HCC) 02/02/2022   Acute metabolic encephalopathy    Acute on chronic combined systolic and diastolic hrt fail (HCC) 12/16/2022   Acute on chronic systolic (congestive) heart failure (HCC)    Acute renal insufficiency 09/02/2016   Acute respiratory failure with hypoxia (HCC) 12/16/2022   Age-related osteoporosis without current pathological fracture 02/02/2022   AKI (acute kidney injury) (HCC) 09/01/2016   Anemia due to stage 4 chronic  kidney disease (HCC) 04/04/2015   Anemia in chronic kidney disease 02/02/2022   Atherosclerosis of aorta (HCC) 02/02/2022   Athscl heart disease of native coronary artery w/o ang pctrs 02/02/2022   Atrial fibrillation (HCC)    Atrial fibrillation with RVR (HCC) 12/06/2022   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) 04/04/2015   Bradycardia 09/17/2014   Chest pain 06/23/2016   Overview:  Added automatically from request for surgery [id number]   Chronic anemia    Chronic kidney disease (CKD), stage III (moderate) (HCC)    Chronic kidney disease, stage 4 (severe) (HCC) 02/02/2022   Chronic systolic CHF (congestive heart failure) (HCC)    Closed left hip fracture (HCC) 03/23/2021   Constipation 03/28/2021   Constipation, unspecified 02/02/2022   Coronary artery disease 10/12/2016   Non drug-eluting stent implanted in June 2018 to mid RCA   08/08/2016 10:37  Angiographic Findings  Cardiac Arteries and Lesion Findings LMCA: 0% and Normal. LAD: 0% and Normal. RCA:   Lesion on Mid RCA: Mid subsection.95% stenosis reduced to 0%. Pre procedure   TIMI II flow was noted. Post Procedure TIMI III flow was present. Poor run   off was present. The lesion was diagnosed as High Risk (C).    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) 04/04/2015   Diabetes mellitus without complication (HCC)    Diverticulosis    Dyslipidemia 09/17/2014   Gastro-esophageal reflux disease without esophagitis 02/02/2022   Gastroesophageal reflux    GI bleed 08/06/2016   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   History  of cardiac monitoring 02/23/2018   monitor inserted   History of falling 02/02/2022   History of iron deficiency anemia 12/28/2017   History of transesophageal echocardiography (TEE)    Hypercalcemia 04/04/2015   Hyperlipidemia    Hyperlipidemia, unspecified 02/02/2022   Hypertensive disorder 07/21/2017   Hypertensive heart disease with heart failure  (HCC)    Hypothyroidism, unspecified 02/02/2022   Iron deficiency anemia 12/28/2017   Iron deficiency anemia, unspecified 02/02/2022   Ischemic cardiomyopathy 02/02/2022   Kidney disease 07/21/2017   Long term (current) use of insulin (HCC) 02/02/2022   LV dysfunction 09/02/2016   Mitral regurgitation severe based on TEE from 2022 April 06/10/2020   Myocardial infarction [facility])    NSTEMI (non-ST elevated myocardial infarction) (HCC) 08/06/2016   Obstructive sleep apnea 09/17/2014   Obstructive sleep apnea (adult) (pediatric) 02/02/2022   Old myocardial infarction 02/02/2022   Orthostatic hypotension 12/28/2017   OSA on CPAP    Other specified heart block 02/02/2022   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   Paroxysmal atrial flutter (HCC) 07/22/2022   Personal history of other diseases of the digestive system 02/02/2022   Postural dizziness with presyncope 09/23/2017   Presence of cardiac pacemaker 02/02/2022   Presence of other cardiac implants and grafts 02/02/2022   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/24/2017   Prsnl hx of TIA (TIA), and cereb infrc w/o resid deficits 02/02/2022   Recurrent left pleural effusion 03/14/2015   Renal failure, chronic, stage 3 (moderate) (HCC)    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/2022   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   Secondary hypercoagulable state (HCC) 04/23/2021   Syncope 11/04/2017   Systolic heart failure (HCC)    Thyroid disease    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   Unspecified atrial fibrillation (HCC) 12/16/2022   Vascular disorder of intestine, unspecified (HCC) 02/02/2022   Vitamin D deficiency 04/04/2015    Past Surgical History:  Procedure Laterality Date   BIOPSY  02/15/2023   Procedure: BIOPSY;  Surgeon: Lynann Bologna, MD;  Location: [facility] ENDOSCOPY;  Service: Gastroenterology;;   Thressa Sheller STUDY  05/16/2020   Procedure:  BUBBLE STUDY;  Surgeon: Parke Poisson, MD;  Location: [facility] ENDOSCOPY;  Service: Cardiovascular;;   CARDIAC CATHETERIZATION     CARDIOVERSION N/A 05/16/2020   Procedure: CARDIOVERSION;  Surgeon: Parke Poisson, MD;  Location[facility] ENDOSCOPY;  Service: Cardiovascular;  Laterality: N/A;   CARDIOVERSION N/A 09/06/2020   Procedure: CARDIOVERSION;  Surgeon: Little Ishikawa, MD;  Location: [facility] ENDOSCOPY;  Service: Cardiovascular;  Laterality: N/A;   CARDIOVERSION N/A 05/16/2021   Procedure: CARDIOVERSION;  Surgeon: Quintella Reichert, MD;  Location: [facility] ENDOSCOPY;  Service: Cardiovascular;  Laterality: N/A;   CARDIOVERSION N/A 08/12/2022   Procedure: CARDIOVERSION;  Surgeon: Thomasene Ripple, DO;  Location: [facility] INVASIVE CV LAB;  Service: Cardiovascular;  Laterality: N/A;   CARDIOVERSION N/A 09/30/2022   Procedure: CARDIOVERSION;  Surgeon: Jodelle Red, MD;  Location: [facility] INVASIVE CV LAB;  Service: Cardiovascular;  Laterality: N/A;   CARDIOVERSION N/A 12/08/2022   Procedure: CARDIOVERSION (CATH LAB);  Surgeon: Christell Constant, MD;  Location: [facility] INVASIVE CV LAB;  Service: Cardiovascular;  Laterality: N/A;   CARDIOVERSION N/A 12/14/2022   Procedure: CARDIOVERSION (CATH LAB);  Surgeon: Jodelle Red, MD;  Location: [facility] INVASIVE CV LAB;  Service: Cardiovascular;  Laterality: N/A;   COLONOSCOPY  11/21/2014   Moderate predominantly sigmoid diverticulosis. Otherwise noraml collonscopy to TI.  CORONARY ANGIOPLASTY WITH STENT PLACEMENT  08/2016   ESOPHAGOGASTRODUODENOSCOPY (EGD) WITH PROPOFOL N/A 02/15/2023   Procedure: ESOPHAGOGASTRODUODENOSCOPY (EGD) WITH PROPOFOL;  Surgeon: Lynann Bologna, MD;  Location: [facility] ENDOSCOPY;  Service: Gastroenterology;  Laterality: N/A;   EXCISIONAL HEMORRHOIDECTOMY     LEFT ATRIAL APPENDAGE OCCLUSION  11/2016   in [place]   LOOP RECORDER INSERTION N/A 02/23/2018   Procedure: LOOP RECORDER INSERTION;  Surgeon: Regan Lemming, MD;  Location: [facility]  INVASIVE CV LAB;  Service: Cardiovascular;  Laterality: N/A;   LOOP RECORDER REMOVAL N/A 04/24/2019   Procedure: LOOP RECORDER REMOVAL;  Surgeon: Regan Lemming, MD;  Location: [facility] INVASIVE CV LAB;  Service: Cardiovascular;  Laterality: N/A;   MALONEY DILATION N/A 02/15/2023   Procedure: Elease Hashimoto DILATION;  Surgeon: Lynann Bologna, MD;  Location: [facility] ENDOSCOPY;  Service: Gastroenterology;  Laterality: N/A;   NOSE SURGERY     PACEMAKER IMPLANT N/A 04/24/2019   Procedure: PACEMAKER IMPLANT;  Surgeon: Regan Lemming, MD;  Location: [facility] INVASIVE CV LAB;  Service: Cardiovascular;  Laterality: N/A;   TEE WITH CARDIOVERSION     TEE WITHOUT CARDIOVERSION N/A 05/16/2020   Procedure: TRANSESOPHAGEAL ECHOCARDIOGRAM (TEE);  Surgeon: Parke Poisson, MD;  Location: [facility] ENDOSCOPY;  Service: Cardiovascular;  Laterality: N/A;   TEE WITHOUT CARDIOVERSION N/A 08/12/2022   Procedure: TRANSESOPHAGEAL ECHOCARDIOGRAM;  Surgeon: Thomasene Ripple, DO;  Location: [facility] INVASIVE CV LAB;  Service: Cardiovascular;  Laterality: N/A;   TEE WITHOUT CARDIOVERSION N/A 09/30/2022   Procedure: TRANSESOPHAGEAL ECHOCARDIOGRAM;  Surgeon: Jodelle Red, MD;  Location: [facility] INVASIVE CV LAB;  Service: Cardiovascular;  Laterality: N/A;   TOTAL HIP REVISION Left 03/25/2021   Procedure: LEFT POSTERIOR TOTAL HIP  ZIMMER CABLES;  Surgeon: Durene Romans, MD;  Location: [facility] ORS;  Service: Orthopedics;  Laterality: Left;    Current Medications: Current Meds  Medication Sig   amiodarone (PACERONE) 200 MG tablet Take 1 tablet (200 mg total) by mouth 2 (two) times daily.   amLODipine (NORVASC) 10 MG tablet Take 10 mg by mouth daily.   Biotin 1000 MCG tablet Take 1,000 mcg by mouth daily.   blood glucose meter kit and supplies KIT Dispense based on patient and insurance preference. Use up to four times daily as directed. (Patient taking differently: Inject 1 each into the skin See admin instructions. Dispense based on patient and  insurance preference. Use up to four times daily as directed.)   carboxymethylcellul-glycerin (REFRESH RELIEVA) 0.5-0.9 % ophthalmic solution Place 1 drop into both eyes 4 (four) times daily as needed for dry eyes. VE   Cholecalciferol (VITAMIN D3) 50 MCG (2000 UT) TABS Take 2,000 Units by mouth daily at 12 noon.   cloNIDine (CATAPRES) 0.1 MG tablet Take 0.1 mg by mouth 2 (two) times daily.   Coenzyme Q10 100 MG capsule Take 100 mg by mouth 2 (two) times daily.   denosumab (PROLIA) 60 MG/ML SOSY injection Inject 60 mg into the skin every 6 (six) months.   estradiol (ESTRACE) 0.1 MG/GM vaginal cream Place 1 Applicatorful vaginally every Monday, Wednesday, and Friday.    Evolocumab (REPATHA SURECLICK) 140 MG/ML SOAJ Inject 140 mg into the skin every 14 (fourteen) days.   famotidine (PEPCID) 10 MG tablet Take 1 tablet (10 mg total) by mouth daily.   febuxostat (ULORIC) 40 MG tablet Take 40 mg by mouth daily.   ferrous sulfate 325 (65 FE) MG EC tablet Take 325 mg by mouth 2 (two) times daily with breakfast and lunch.   folic acid (FOLVITE) 800  MCG tablet Take 800 mcg by mouth daily at 12 noon.   Glucosamine-Chondroitin (OSTEO BI-FLEX REGULAR STRENGTH PO) Take 25 mcg by mouth daily at 12 noon. Plus D3   HUMALOG KWIKPEN 100 UNIT/ML KwikPen Inject 2-6 Units into the skin as directed. Per sliding scale   hydrALAZINE (APRESOLINE) 50 MG tablet Take 1 tablet (50 mg total) by mouth every 6 (six) hours.   isosorbide mononitrate (IMDUR) 60 MG 24 hr tablet Take 60 mg by mouth daily.   Lactobacillus (FLORAJEN ACIDOPHILUS) CAPS Take 1 capsule by mouth daily at 12 noon. 390 mg each   levothyroxine (SYNTHROID, LEVOTHROID) 50 MCG tablet Take 50 mcg by mouth daily before breakfast.   metoprolol tartrate (LOPRESSOR) 25 MG tablet Take 25 mg by mouth 2 (two) times daily.   Multiple Vitamins-Minerals (PRESERVISION AREDS 2) CAPS Take 1 capsule by mouth 2 (two) times daily.   nitroGLYCERIN (NITROSTAT) 0.4 MG SL tablet  Place 0.4 mg under the tongue every 5 (five) minutes as needed for chest pain.   omega-3 acid ethyl esters (LOVAZA) 1 g capsule Take 2 capsules (2 g total) by mouth daily.   pantoprazole (PROTONIX) 40 MG tablet Take 1 tablet (40 mg total) by mouth daily.   pravastatin (PRAVACHOL) 40 MG tablet Take 40 mg by mouth every other day.   ranolazine (RANEXA) 500 MG 12 hr tablet Take 1 tablet (500 mg total) by mouth 2 (two) times daily.   torsemide (DEMADEX) 20 MG tablet Take 20 mg by mouth 2 (two) times daily.   vitamin B-12 (CYANOCOBALAMIN) 1000 MCG tablet Take 1,000 mcg by mouth in the morning and at bedtime.   Wheat Dextrin (BENEFIBER PO) Take 3 tablets by mouth daily.     Allergies:   Ciprofloxacin, Contrast media [iodinated contrast media], Crestor [rosuvastatin], Lipitor [atorvastatin], Nsaids, and Statins   Social History   Socioeconomic History   Marital status: Married    Spouse name: Not on file   Number of children: 2   Years of education: Not on file   Highest education level: Not on file  Occupational History   Occupation: Retired   Tobacco Use   Smoking status: Former   Smokeless tobacco: Former    Quit date: 1997   Tobacco comments:    Former smoker 05/22/21  Vaping Use   Vaping status: Never Used  Substance and Sexual Activity   Alcohol use: Yes    Alcohol/week: 2.0 standard drinks of alcohol    Types: 2 Standard drinks or equivalent per week    Comment: Occ   Drug use: No   Sexual activity: Not on file  Other Topics Concern   Not on file  Social History Narrative   Watchman device 2018   Social Drivers of Health   Financial Resource Strain: Not on file  Food Insecurity: No Food Insecurity (12/07/2022)   Hunger Vital Sign    Worried About Running Out of Food in the Last Year: Never true    Ran Out of Food in the Last Year: Never true  Transportation Needs: No Transportation Needs (12/07/2022)   PRAPARE - Administrator, Civil Service (Medical): No     Lack of Transportation (Non-Medical): No  Physical Activity: Not on file  Stress: Not on file  Social Connections: Not on file     Family History: The patient's family history includes Breast cancer in her mother; Cancer in her maternal grandfather; Diabetes in her sister; Heart attack in her brother, maternal uncle, and  sister; Hypertension in her father; Prostate cancer in her father; Rectal cancer in her maternal uncle; Stroke in her father and maternal uncle. There is no history of Esophageal cancer, Pancreatic cancer, or Stomach cancer. ROS:   Please see the history of present illness.    All 14 point review of systems negative except as described per history of present illness  EKGs/Labs/Other Studies Reviewed:         Recent Labs: 10/15/2022: NT-Pro BNP 4,009 12/06/2022: B Natriuretic Peptide 1,011.0; TSH 2.510 12/14/2022: Magnesium 2.3 01/18/2023: ALT 27 03/12/2023: BUN 40; Creatinine, Ser 2.50; Hemoglobin 10.8; Platelets 196; Potassium 3.8; Sodium 145  Recent Lipid Panel    Component Value Date/Time   CHOL 125 09/05/2019 0842   TRIG 162 (H) 09/05/2019 0842   HDL 59 09/05/2019 0842   CHOLHDL 2.1 09/05/2019 0842   LDLCALC 39 09/05/2019 0842    Physical Exam:    VS:  BP 132/76 (BP Location: Right Arm, Patient Position: Sitting)   Pulse 90   Ht 5\' 3"  (1.6 m)   Wt 159 lb 3.2 oz (72.2 kg)   SpO2 99%   BMI 28.20 kg/m     Wt Readings from Last 3 Encounters:  05/10/23 159 lb 3.2 oz (72.2 kg)  03/12/23 165 lb (74.8 kg)  02/15/23 169 lb 1.5 oz (76.7 kg)     GEN:  Well nourished, well developed in no acute distress HEENT: Normal NECK: No JVD; No carotid bruits LYMPHATICS: No lymphadenopathy CARDIAC: RRR, no murmurs, no rubs, no gallops RESPIRATORY:  Clear to auscultation without rales, wheezing or rhonchi  ABDOMEN: Soft, non-tender, non-distended MUSCULOSKELETAL:  No edema; No deformity  SKIN: Warm and dry LOWER EXTREMITIES: no swelling NEUROLOGIC:  Alert and  oriented x 3 PSYCHIATRIC:  Normal affect   ASSESSMENT:    1. Paroxysmal atrial fibrillation (HCC)   2. Nonrheumatic mitral valve regurgitation   3. 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  Will cut down her amiodarone from 200 mg to only 100 mg daily, we will continue continue watching with intention probably to discontinue amiodarone if rate is currently reasonably controlled. Nonrheumatic mitral valve regurgitation is being assessed as mild to moderate last time.  I will send a message to our structural heart disease team to see if this is something that is feasible for intervention from their point of view.  She does have some kidney dysfunction which is pretty bad nephrologist talk about potentially starting dialysis soon.  York Spaniel that is the case we may be able to proceed and simply fix the valve. Congestive heart failure compensated on physical exam. Coronary disease stable from that point review. Diabetes followed by internal medicine team.   Medication Adjustments/Labs and Tests Ordered: Current medicines are reviewed at length with the patient today.  Concerns regarding medicines are outlined above.  Orders Placed This Encounter  Procedures   EKG 12-Lead   Medication changes: No orders of the defined types were placed in this encounter.   Signed, Georgeanna Lea, MD, [facility] 05/10/2023 10:49 AM    [facility]

## 2023-05-11 ENCOUNTER — Telehealth: Payer: Self-pay

## 2023-05-11 NOTE — Telephone Encounter (Signed)
 Per Dr. Bing Matter, scheduled the patient for mTEER consult with Dr. Excell Seltzer 05/13/2023. She was grateful for call and agreed with plan.

## 2023-05-11 NOTE — Telephone Encounter (Signed)
-----   Message from Tonny Bollman sent at 05/10/2023  3:30 PM EDT ----- Amanda [name] - happy to see her. 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 May need to repeat. We usually do a preop cath but with her kidneys I'd have to discuss all of this with her before proceeding. ----- Message ----- From: Georgeanna Lea, MD Sent: 05/10/2023  10:54 AM EDT To: Tonny Bollman, MD  [name],  I would like you to take a look at this very complicated lady.  She does have moderate to severe mitral regurgitation and the question is should we consider mitral valve clip for her.  She does have significant kidney dysfunction but from what I understand we do not use much contrast for procedure like mitral valve clip.  At the same time a nephrologist preparing her for dialysis.  Safety that might look at her echocardiogram and entire case to see if she would be potential candidate for mitral valve prolapse.  I did see her today in my office she looks hemodynamically compensated. Thank you like always for your expertise  Molly Maduro

## 2023-05-13 ENCOUNTER — Ambulatory Visit: Attending: Cardiovascular Disease | Admitting: Cardiovascular Disease

## 2023-05-13 ENCOUNTER — Encounter: Payer: Self-pay | Admitting: Cardiovascular Disease

## 2023-05-13 VITALS — BP 130/88 | HR 92 | Ht 63.0 in | Wt 160.2 lb

## 2023-05-13 DIAGNOSIS — Z01810 Encounter for preprocedural cardiovascular examination: Secondary | ICD-10-CM

## 2023-05-13 DIAGNOSIS — I340 Nonrheumatic mitral (valve) insufficiency: Secondary | ICD-10-CM | POA: Diagnosis not present

## 2023-05-13 NOTE — Progress Notes (Unsigned)
 Cardiology Office Note:    Date:  05/14/2023   ID:  Amanda [name], DOB 08-24-1940, MRN [id number]  PCP:  Street, Stephanie Coup, MD   [facility] Providers Cardiologist:  Gypsy Balsam, MD Electrophysiologist:  Will Jorja Loa, MD     Referring MD: Street, Stephanie Coup, *   Chief Complaint  Patient presents with   Mitral Regurgitation    History of Present Illness:    Amanda [name] is a 83 y.o. female referred for evaluation of mitral regurgitation by Dr. Bing Matter.  She she is here with her daughter, Jeanice Lim, today.  The patient has a history of permanent atrial fibrillation status post watchman implantation.  She has significant comorbidities including stage IV chronic kidney disease, coronary artery disease, complete heart block status post permanent pacemaker, type 2 diabetes, and obstructive sleep apnea.  She was found to have severe mitral regurgitation on an echocardiogram done in January of this year and she is referred for further evaluation.  Her echocardiogram demonstrated normal LVEF of 60 to 65%, normal RV systolic function, moderately elevated pulmonary artery pressure, moderate to severe tricuspid regurgitation, and moderate to severe mitral regurgitation.  The patient's symptoms include fatigue and exertional dyspnea.  She does not notice a big change in her symptoms which have been chronic over the past few years.  She has not been hospitalized for heart failure in several years.  She has been managed medically and feels like she is doing fairly well.  She denies lightheadedness, syncope, or chest pain.  No orthopnea or PND.  She has no history of rheumatic heart disease.  She does have a history of atrial fibrillation and has undergone multiple cardioversion procedures over the years, now with permanent atrial fibrillation.  Her primary complaint is fatigue and exercise intolerance.  Past Medical History:  Diagnosis Date   (HFpEF) heart failure with  preserved ejection fraction (HCC) 12/13/2022   Abnormal nuclear stress test 06/23/2016   Overview:   Added automatically from request for surgery [id number]     Acute ischemic stroke [facility])    Acute kidney failure, unspecified (HCC) 02/02/2022   Acute metabolic encephalopathy    Acute on chronic combined systolic and diastolic hrt fail (HCC) 12/16/2022   Acute on chronic systolic (congestive) heart failure (HCC)    Acute renal insufficiency 09/02/2016   Acute respiratory failure with hypoxia (HCC) 12/16/2022   Age-related osteoporosis without current pathological fracture 02/02/2022   AKI (acute kidney injury) (HCC) 09/01/2016   Anemia due to stage 4 chronic kidney disease (HCC) 04/04/2015   Anemia in chronic kidney disease 02/02/2022   Atherosclerosis of aorta (HCC) 02/02/2022   Athscl heart disease of native coronary artery w/o ang pctrs 02/02/2022   Atrial fibrillation (HCC)    Atrial fibrillation with RVR (HCC) 12/06/2022   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) 04/04/2015   Bradycardia 09/17/2014   Chest pain 06/23/2016   Overview:  Added automatically from request for surgery [id number]   Chronic anemia    Chronic kidney disease (CKD), stage III (moderate) (HCC)    Chronic kidney disease, stage 4 (severe) (HCC) 02/02/2022   Chronic systolic CHF (congestive heart failure) (HCC)    Closed left hip fracture (HCC) 03/23/2021   Constipation 03/28/2021   Constipation, unspecified 02/02/2022   Coronary artery disease 10/12/2016   Non drug-eluting stent implanted in June 2018 to mid RCA   08/08/2016 10:37  Angiographic Findings  Cardiac Arteries and Lesion Findings LMCA: 0% and  Normal. LAD: 0% and Normal. RCA:   Lesion on Mid RCA: Mid subsection.95% stenosis reduced to 0%. Pre procedure   TIMI II flow was noted. Post Procedure TIMI III flow was present. Poor run   off was present. The lesion was diagnosed as High Risk (C).    Diabetes mellitus with stage 4 chronic  kidney disease GFR 15-29 (HCC) 04/04/2015   Diabetes mellitus without complication (HCC)    Diverticulosis    Dyslipidemia 09/17/2014   Gastro-esophageal reflux disease without esophagitis 02/02/2022   Gastroesophageal reflux    GI bleed 08/06/2016   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   History of cardiac monitoring 02/23/2018   monitor inserted   History of falling 02/02/2022   History of iron deficiency anemia 12/28/2017   History of transesophageal echocardiography (TEE)    Hypercalcemia 04/04/2015   Hyperlipidemia    Hyperlipidemia, unspecified 02/02/2022   Hypertensive disorder 07/21/2017   Hypertensive heart disease with heart failure (HCC)    Hypothyroidism, unspecified 02/02/2022   Iron deficiency anemia 12/28/2017   Iron deficiency anemia, unspecified 02/02/2022   Ischemic cardiomyopathy 02/02/2022   Kidney disease 07/21/2017   Long term (current) use of insulin (HCC) 02/02/2022   LV dysfunction 09/02/2016   Mitral regurgitation severe based on TEE from 2022 April 06/10/2020   Myocardial infarction [facility])    NSTEMI (non-ST elevated myocardial infarction) (HCC) 08/06/2016   Obstructive sleep apnea 09/17/2014   Obstructive sleep apnea (adult) (pediatric) 02/02/2022   Old myocardial infarction 02/02/2022   Orthostatic hypotension 12/28/2017   OSA on CPAP    Other specified heart block 02/02/2022   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   Paroxysmal atrial flutter (HCC) 07/22/2022   Personal history of other diseases of the digestive system 02/02/2022   Postural dizziness with presyncope 09/23/2017   Presence of cardiac pacemaker 02/02/2022   Presence of other cardiac implants and grafts 02/02/2022   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/24/2017   Prsnl hx of TIA (TIA), and cereb infrc w/o resid deficits 02/02/2022   Recurrent left pleural effusion 03/14/2015   Renal failure, chronic, stage 3 (moderate) (HCC)    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 02/02/2022   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   Secondary hypercoagulable state (HCC) 04/23/2021   Syncope 11/04/2017   Systolic heart failure (HCC)    Thyroid disease    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   Unspecified atrial fibrillation (HCC) 12/16/2022   Vascular disorder of intestine, unspecified (HCC) 02/02/2022   Vitamin D deficiency 04/04/2015    Past Surgical History:  Procedure Laterality Date   BIOPSY  02/15/2023   Procedure: BIOPSY;  Surgeon: Lynann Bologna, MD;  Location: [facility] ENDOSCOPY;  Service: Gastroenterology;;   Thressa Sheller STUDY  05/16/2020   Procedure: BUBBLE STUDY;  Surgeon: Parke Poisson, MD;  Location: [facility] ENDOSCOPY;  Service: Cardiovascular;;   CARDIAC CATHETERIZATION     CARDIOVERSION N/A 05/16/2020   Procedure: CARDIOVERSION;  Surgeon: Parke Poisson, MD;  Location[facility] ENDOSCOPY;  Service: Cardiovascular;  Laterality: N/A;   CARDIOVERSION N/A 09/06/2020   Procedure: CARDIOVERSION;  Surgeon: Little Ishikawa, MD;  Location: [facility] ENDOSCOPY;  Service: Cardiovascular;  Laterality: N/A;   CARDIOVERSION N/A 05/16/2021   Procedure: CARDIOVERSION;  Surgeon: Quintella Reichert, MD;  Location: [facility] ENDOSCOPY;  Service: Cardiovascular;  Laterality: N/A;   CARDIOVERSION N/A 08/12/2022   Procedure: CARDIOVERSION;  Surgeon: Thomasene Ripple, DO;  Location: [facility] INVASIVE CV  LAB;  Service: Cardiovascular;  Laterality: N/A;   CARDIOVERSION N/A 09/30/2022   Procedure: CARDIOVERSION;  Surgeon: Jodelle Red, MD;  Location: [facility] INVASIVE CV LAB;  Service: Cardiovascular;  Laterality: N/A;   CARDIOVERSION N/A 12/08/2022   Procedure: CARDIOVERSION (CATH LAB);  Surgeon: Christell Constant, MD;  Location: [facility] INVASIVE CV LAB;  Service: Cardiovascular;  Laterality: N/A;   CARDIOVERSION N/A 12/14/2022   Procedure: CARDIOVERSION (CATH LAB);  Surgeon: Jodelle Red, MD;  Location: [facility] INVASIVE  CV LAB;  Service: Cardiovascular;  Laterality: N/A;   COLONOSCOPY  11/21/2014   Moderate predominantly sigmoid diverticulosis. Otherwise noraml collonscopy to TI.    CORONARY ANGIOPLASTY WITH STENT PLACEMENT  08/2016   ESOPHAGOGASTRODUODENOSCOPY (EGD) WITH PROPOFOL N/A 02/15/2023   Procedure: ESOPHAGOGASTRODUODENOSCOPY (EGD) WITH PROPOFOL;  Surgeon: Lynann Bologna, MD;  Location: [facility] ENDOSCOPY;  Service: Gastroenterology;  Laterality: N/A;   EXCISIONAL HEMORRHOIDECTOMY     LEFT ATRIAL APPENDAGE OCCLUSION  11/2016   in [place]   LOOP RECORDER INSERTION N/A 02/23/2018   Procedure: LOOP RECORDER INSERTION;  Surgeon: Regan Lemming, MD;  Location: [facility] INVASIVE CV LAB;  Service: Cardiovascular;  Laterality: N/A;   LOOP RECORDER REMOVAL N/A 04/24/2019   Procedure: LOOP RECORDER REMOVAL;  Surgeon: Regan Lemming, MD;  Location: [facility] INVASIVE CV LAB;  Service: Cardiovascular;  Laterality: N/A;   MALONEY DILATION N/A 02/15/2023   Procedure: Elease Hashimoto DILATION;  Surgeon: Lynann Bologna, MD;  Location: [facility] ENDOSCOPY;  Service: Gastroenterology;  Laterality: N/A;   NOSE SURGERY     PACEMAKER IMPLANT N/A 04/24/2019   Procedure: PACEMAKER IMPLANT;  Surgeon: Regan Lemming, MD;  Location: [facility] INVASIVE CV LAB;  Service: Cardiovascular;  Laterality: N/A;   TEE WITH CARDIOVERSION     TEE WITHOUT CARDIOVERSION N/A 05/16/2020   Procedure: TRANSESOPHAGEAL ECHOCARDIOGRAM (TEE);  Surgeon: Parke Poisson, MD;  Location: [facility] ENDOSCOPY;  Service: Cardiovascular;  Laterality: N/A;   TEE WITHOUT CARDIOVERSION N/A 08/12/2022   Procedure: TRANSESOPHAGEAL ECHOCARDIOGRAM;  Surgeon: Thomasene Ripple, DO;  Location: [facility] INVASIVE CV LAB;  Service: Cardiovascular;  Laterality: N/A;   TEE WITHOUT CARDIOVERSION N/A 09/30/2022   Procedure: TRANSESOPHAGEAL ECHOCARDIOGRAM;  Surgeon: Jodelle Red, MD;  Location: [facility] INVASIVE CV LAB;  Service: Cardiovascular;  Laterality: N/A;   TOTAL HIP REVISION Left 03/25/2021    Procedure: LEFT POSTERIOR TOTAL HIP  ZIMMER CABLES;  Surgeon: Durene Romans, MD;  Location: [facility] ORS;  Service: Orthopedics;  Laterality: Left;    Current Medications: Current Meds  Medication Sig   amiodarone (PACERONE) 200 MG tablet Take 0.5 tablets (100 mg total) by mouth daily.   amLODipine (NORVASC) 10 MG tablet Take 10 mg by mouth daily.   Biotin 1000 MCG tablet Take 1,000 mcg by mouth daily.   blood glucose meter kit and supplies KIT Dispense based on patient and insurance preference. Use up to four times daily as directed. (Patient taking differently: Inject 1 each into the skin See admin instructions. Dispense based on patient and insurance preference. Use up to four times daily as directed.)   Boswellia-Glucosamine-Vit D (OSTEO BI-FLEX-GLUCOS/5-LOXIN PO) Take by mouth. Taking 1 tablet at noon   carboxymethylcellul-glycerin (REFRESH RELIEVA) 0.5-0.9 % ophthalmic solution Place 1 drop into both eyes 4 (four) times daily as needed for dry eyes. VE   Cholecalciferol (VITAMIN D3) 50 MCG (2000 UT) TABS Take 2,000 Units by mouth daily at 12 noon.   cloNIDine (CATAPRES) 0.1 MG tablet Take 0.1 mg by mouth 2 (two) times daily.   Coenzyme Q10 100 MG capsule  Take 100 mg by mouth 2 (two) times daily.   denosumab (PROLIA) 60 MG/ML SOSY injection Inject 60 mg into the skin every 6 (six) months.   estradiol (ESTRACE) 0.1 MG/GM vaginal cream Place 1 Applicatorful vaginally every Monday, Wednesday, and Friday.    Evolocumab (REPATHA SURECLICK) 140 MG/ML SOAJ Inject 140 mg into the skin every 14 (fourteen) days.   famotidine (PEPCID) 40 MG tablet Take 40 mg by mouth daily.   febuxostat (ULORIC) 40 MG tablet Take 40 mg by mouth daily.   ferrous sulfate 325 (65 FE) MG EC tablet Take 325 mg by mouth 2 (two) times daily with breakfast and lunch.   folic acid (FOLVITE) 800 MCG tablet Take 800 mcg by mouth daily at 12 noon.   Glucosamine-Chondroitin (OSTEO BI-FLEX REGULAR STRENGTH PO) Take 25 mcg by mouth daily  at 12 noon. Plus D3   HUMALOG KWIKPEN 100 UNIT/ML KwikPen Inject 2-6 Units into the skin as directed. Per sliding scale   hydrALAZINE (APRESOLINE) 50 MG tablet Take 1 tablet (50 mg total) by mouth every 6 (six) hours.   isosorbide mononitrate (IMDUR) 60 MG 24 hr tablet Take 60 mg by mouth daily.   Lactobacillus (FLORAJEN ACIDOPHILUS) CAPS Take 1 capsule by mouth daily at 12 noon. 390 mg each   levothyroxine (SYNTHROID, LEVOTHROID) 50 MCG tablet Take 50 mcg by mouth daily before breakfast.   metoprolol tartrate (LOPRESSOR) 25 MG tablet Take 25 mg by mouth 2 (two) times daily.   Multiple Vitamins-Minerals (PRESERVISION AREDS 2) CAPS Take 1 capsule by mouth 2 (two) times daily.   nitroGLYCERIN (NITROSTAT) 0.4 MG SL tablet Place 0.4 mg under the tongue every 5 (five) minutes as needed for chest pain.   Nutritional Supplements (GLUCERNA MEAL PO) Take by mouth. Patient taking 237 ml three times a day   omega-3 acid ethyl esters (LOVAZA) 1 g capsule Take 2 capsules (2 g total) by mouth daily.   pantoprazole (PROTONIX) 40 MG tablet Take 1 tablet (40 mg total) by mouth daily.   Polyethylene Glycol 3350 (MIRALAX PO) Take by mouth as needed.   pravastatin (PRAVACHOL) 40 MG tablet Take 40 mg by mouth every other day.   Probiotic Product (FLORAJEN3 PO) Take by mouth daily.   ranolazine (RANEXA) 500 MG 12 hr tablet Take 1 tablet (500 mg total) by mouth 2 (two) times daily.   torsemide (DEMADEX) 20 MG tablet Take 20 mg by mouth 2 (two) times daily.   vitamin B-12 (CYANOCOBALAMIN) 1000 MCG tablet Take 1,000 mcg by mouth in the morning and at bedtime.   Wheat Dextrin (BENEFIBER PO) Take 3 tablets by mouth daily.     Allergies:   Ciprofloxacin, Contrast media [iodinated contrast media], Crestor [rosuvastatin], Lipitor [atorvastatin], Nsaids, and Statins   Social History   Socioeconomic History   Marital status: Married    Spouse name: Not on file   Number of children: 2   Years of education: Not on file    Highest education level: Not on file  Occupational History   Occupation: Retired   Tobacco Use   Smoking status: Former   Smokeless tobacco: Former    Quit date: 1997   Tobacco comments:    Former smoker 05/22/21  Vaping Use   Vaping status: Never Used  Substance and Sexual Activity   Alcohol use: Yes    Alcohol/week: 2.0 standard drinks of alcohol    Types: 2 Standard drinks or equivalent per week    Comment: Occ   Drug use:  No   Sexual activity: Not on file  Other Topics Concern   Not on file  Social History Narrative   Watchman device 2018   Social Drivers of Health   Financial Resource Strain: Not on file  Food Insecurity: No Food Insecurity (12/07/2022)   Hunger Vital Sign    Worried About Running Out of Food in the Last Year: Never true    Ran Out of Food in the Last Year: Never true  Transportation Needs: No Transportation Needs (12/07/2022)   PRAPARE - Administrator, Civil Service (Medical): No    Lack of Transportation (Non-Medical): No  Physical Activity: Not on file  Stress: Not on file  Social Connections: Not on file     Family History: The patient's family history includes Breast cancer in her mother; Cancer in her maternal grandfather; Diabetes in her sister; Heart attack in her brother, maternal uncle, and sister; Hypertension in her father; Prostate cancer in her father; Rectal cancer in her maternal uncle; Stroke in her father and maternal uncle. There is no history of Esophageal cancer, Pancreatic cancer, or Stomach cancer.  ROS:   Please see the history of present illness.    All other systems reviewed and are negative.  EKGs/Labs/Other Studies Reviewed:    The following studies were reviewed today: Cardiac Studies & Procedures   ______________________________________________________________________________________________     ECHOCARDIOGRAM  ECHOCARDIOGRAM COMPLETE 03/02/2023  Narrative ECHOCARDIOGRAM REPORT    Patient  Name:   KEYANAH KOZICKI Date of Exam: 03/02/2023 Medical Rec #:  [id number]       Height:       63.0 in Accession #:    [id number]      Weight:       169.1 lb Date of Birth:  1940-06-04      BSA:          1.800 m Patient Age:    82 years        BP:           126/52 mmHg Patient Gender: F               HR:           55 bpm. Exam Location:  [place]  Procedure: 2D Echo, Cardiac Doppler, Color Doppler and Strain Analysis  Indications:    Nonrheumatic mitral valve regurgitation [I34.0 (ICD-10-CM)]  History:        Patient has prior history of Echocardiogram examinations, most recent 12/18/2022. CHF, CAD and Previous Myocardial Infarction, Pacemaker, Stroke, Mitral Valve Disease, Arrythmias:Atrial Fibrillation, Signs/Symptoms:Syncope; Risk Factors:Hypertension, Diabetes and Former Smoker.  Sonographer:    Margreta Journey RDCS Referring Phys: [id number] ROBERT J KRASOWSKI  IMPRESSIONS   1. Left ventricular ejection fraction, by estimation, is 60 to 65%. The left ventricle has normal function. The left ventricle has no regional wall motion abnormalities. There is mild left ventricular hypertrophy. Left ventricular diastolic parameters are indeterminate. The average left ventricular global longitudinal strain is -10.2 %. The global longitudinal strain is abnormal. 2. Right ventricular systolic function is normal. The right ventricular size is mildly enlarged. There is moderately elevated pulmonary artery systolic pressure. 3. Left atrial size was mild to moderately dilated. 4. Right atrial size was severely dilated. 5. The mitral valve is normal in structure. Moderate to severe mitral valve regurgitation. No evidence of mitral stenosis. 6. Tricuspid valve regurgitation is moderate to severe. 7. The aortic valve is normal in structure. Aortic valve regurgitation is not visualized. Aortic valve sclerosis/calcification  is present, without any evidence of aortic stenosis. 8. The inferior vena cava is  normal in size with greater than 50% respiratory variability, suggesting right atrial pressure of 3 mmHg.  FINDINGS Left Ventricle: Left ventricular ejection fraction, by estimation, is 60 to 65%. The left ventricle has normal function. The left ventricle has no regional wall motion abnormalities. The average left ventricular global longitudinal strain is -10.2 %. The global longitudinal strain is abnormal. The left ventricular internal cavity size was normal in size. There is mild left ventricular hypertrophy. Left ventricular diastolic parameters are indeterminate.  Right Ventricle: The right ventricular size is mildly enlarged. No increase in right ventricular wall thickness. Right ventricular systolic function is normal. There is moderately elevated pulmonary artery systolic pressure. The tricuspid regurgitant velocity is 3.75 m/s, and with an assumed right atrial pressure of 3 mmHg, the estimated right ventricular systolic pressure is 59.2 mmHg.  Left Atrium: Left atrial size was mild to moderately dilated.  Right Atrium: Right atrial size was severely dilated.  Pericardium: There is no evidence of pericardial effusion.  Mitral Valve: The mitral valve is normal in structure. Moderate to severe mitral valve regurgitation. No evidence of mitral valve stenosis.  Tricuspid Valve: The tricuspid valve is normal in structure. Tricuspid valve regurgitation is moderate to severe. No evidence of tricuspid stenosis.  Aortic Valve: The aortic valve is normal in structure. Aortic valve regurgitation is not visualized. Aortic valve sclerosis/calcification is present, without any evidence of aortic stenosis.  Pulmonic Valve: The pulmonic valve was normal in structure. Pulmonic valve regurgitation is not visualized. No evidence of pulmonic stenosis.  Aorta: The aortic root is normal in size and structure.  Venous: The inferior vena cava is normal in size with greater than 50% respiratory variability,  suggesting right atrial pressure of 3 mmHg.  IAS/Shunts: No atrial level shunt detected by color flow Doppler.   LEFT VENTRICLE PLAX 2D LVIDd:         3.80 cm   Diastology LVIDs:         2.60 cm   LV e' medial:    6.31 cm/s LV PW:         1.30 cm   LV E/e' medial:  24.1 LV IVS:        1.30 cm   LV e' lateral:   9.46 cm/s LVOT diam:     1.80 cm   LV E/e' lateral: 16.1 LV SV:         41 LV SV Index:   23        2D Longitudinal Strain LVOT Area:     2.54 cm  2D Strain GLS Avg:     -10.2 %   RIGHT VENTRICLE            IVC RV Basal diam:  4.60 cm    IVC diam: 2.00 cm RV Mid diam:    4.00 cm RV S prime:     8.38 cm/s TAPSE (M-mode): 1.8 cm  LEFT ATRIUM             Index        RIGHT ATRIUM           Index LA diam:        5.10 cm 2.83 cm/m   RA Area:     29.80 cm LA Vol (A2C):   50.6 ml 28.10 ml/m  RA Volume:   102.00 ml 56.65 ml/m LA Vol (A4C):   67.0 ml 37.21 ml/m LA Biplane  Vol: 58.1 ml 32.27 ml/m AORTIC VALVE LVOT Vmax:   75.05 cm/s LVOT Vmean:  49.250 cm/s LVOT VTI:    0.163 m  AORTA Ao Root diam: 3.00 cm Ao Asc diam:  3.30 cm Ao Desc diam: 1.80 cm  MITRAL VALVE                  TRICUSPID VALVE MV Area (PHT): 3.87 cm       TR Peak grad:   56.2 mmHg MV Decel Time: 196 msec       TR Vmax:        375.00 cm/s MR Peak grad:    147.4 mmHg MR Mean grad:    97.0 mmHg    SHUNTS MR Vmax:         607.00 cm/s  Systemic VTI:  0.16 m MR Vmean:        465.5 cm/s   Systemic Diam: 1.80 cm MR PISA:         3.08 cm MR PISA Eff ROA: 20 mm MR PISA Radius:  0.70 cm MV E velocity: 152.00 cm/s MV A velocity: 61.60 cm/s MV E/A ratio:  2.47  Gypsy Balsam MD Electronically signed by Gypsy Balsam MD Signature Date/Time: 03/02/2023/2:06:12 PM    Final   TEE  ECHO TEE 09/30/2022  Narrative TRANSESOPHOGEAL ECHO REPORT    Patient Name:   SONAM WANDEL Date of Exam: 09/30/2022 Medical Rec #:  [id number]       Height:       63.0 in Accession #:    [id number]       Weight:       179.9 lb Date of Birth:  04/22/1940      BSA:          1.849 m Patient Age:    81 years        BP:           151/100 mmHg Patient Gender: F               HR:           74 bpm. Exam Location:  Outpatient  Procedure: Transesophageal Echo, 3D Echo, Color Doppler and Cardiac Doppler  Indications:     thrombus on pacemaker leads  History:         Patient has prior history of Echocardiogram examinations, most recent 08/12/2022. CAD, Pacemaker, chronic kidney disease, Arrythmias:Atrial Fibrillation; Risk Factors:Diabetes, Dyslipidemia, Sleep Apnea and Hypertension.  Sonographer:     Delcie Roch RDCS Referring Phys:  [id number] BRIDGETTE CHRISTOPHER Diagnosing Phys: Jodelle Red MD  PROCEDURE: After discussion of the risks and benefits of a TEE, an informed consent was obtained from the patient. The transesophogeal probe was passed without difficulty through the esophogus of the patient. Imaged were obtained with the patient in a left lateral decubitus position. Sedation performed by different physician. The patient was monitored while under deep sedation. Anesthestetic sedation was provided intravenously by Anesthesiology: 270mg  of Propofol, 80mg  of Lidocaine. The patient developed no complications during the procedure. A successful direct current cardioversion was performed at 120 joules with 1 attempt.  IMPRESSIONS   1. Left ventricular ejection fraction, by estimation, is 55 to 60%. The left ventricle has normal function. The left ventricle has no regional wall motion abnormalities. 2. Right ventricular systolic function is normal. The right ventricular size is normal. 3. Watchman device present in LAA, well seated without residual flow. Left atrial size was mildly dilated. No left atrial/left atrial appendage thrombus  was detected. 4. The mitral valve is degenerative. Moderate to severe mitral valve regurgitation. No evidence of mitral stenosis. 5. Tricuspid valve  regurgitation is severe. 6. The aortic valve is tricuspid. There is mild calcification of the aortic valve. Aortic valve regurgitation is not visualized. No aortic stenosis is present. 7. There is Severe (Grade IV) atheroma plaque involving the descending aorta. 8. Agitated saline contrast bubble study was negative, with no evidence of any interatrial shunt.  Comparison(s): Fibrinous strands on pacer leads not consistent with thrombus. No right to left shunt. Successful cardioversion.  FINDINGS Left Ventricle: Left ventricular ejection fraction, by estimation, is 55 to 60%. The left ventricle has normal function. The left ventricle has no regional wall motion abnormalities. The left ventricular internal cavity size was normal in size.  Right Ventricle: There are two small fibrinous strands, one on the RA lead and one on the RV lead. These appear similar to prior. The right ventricular size is normal. No increase in right ventricular wall thickness. Right ventricular systolic function is normal.  Left Atrium: Watchman device present in LAA, well seated without residual flow. Left atrial size was mildly dilated. No left atrial/left atrial appendage thrombus was detected.  Right Atrium: Right atrial size was normal in size.  Pericardium: There is no evidence of pericardial effusion.  Mitral Valve: The mitral valve is degenerative in appearance. Moderate to severe mitral valve regurgitation. No evidence of mitral valve stenosis. There is no evidence of mitral valve vegetation.  Tricuspid Valve: The tricuspid valve is normal in structure. Tricuspid valve regurgitation is severe. No evidence of tricuspid stenosis. There is no evidence of tricuspid valve vegetation.  Aortic Valve: The aortic valve is tricuspid. There is mild calcification of the aortic valve. Aortic valve regurgitation is not visualized. No aortic stenosis is present. There is no evidence of aortic valve vegetation.  Pulmonic  Valve: The pulmonic valve was grossly normal. Pulmonic valve regurgitation is trivial. No evidence of pulmonic stenosis. There is no evidence of pulmonic valve vegetation.  Aorta: The aortic root and ascending aorta are structurally normal, with no evidence of dilitation. 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  IAS/Shunts: No atrial level shunt detected by color flow Doppler. Agitated saline contrast was given intravenously to evaluate for intracardiac shunting. Agitated saline contrast bubble study was negative, with no evidence of any interatrial shunt.  Additional Comments: A device lead is visualized in the right atrium and right ventricle. Spectral Doppler performed.  MR Peak grad:    130.4 mmHg MR Mean grad:    91.0 mmHg MR Vmax:         571.00 cm/s MR Vmean:        459.0 cm/s MR PISA:         1.90 cm MR PISA Eff ROA: 13 mm MR PISA Radius:  0.55 cm  Jodelle Red MD Electronically signed by Jodelle Red MD Signature Date/Time: 10/01/2022/12:50:36 PM    Final  MONITORS  CARDIAC EVENT MONITOR 09/01/2017  Narrative Aleda Grana, DOB [birth year], MRN [id number]  EVENT MONITOR REPORT:   Patient was monitored from 09/01/2017 to 09/30/2017. Indication:                    Paroxysmal atrial fibrillation Ordering physician:  Georgeanna Lea, MD Referring physician:  Georgeanna Lea MD  Patient wear event recorder for 26 days.  The eighth trigger events.   Baseline rhythm: Sinus rhythm   Atrial arrhythmia:.  Of APCs  felt as fluttering or palpitations.  No clear-cut sustained arrhythmia.  No clear-cut atrial fibrillation identified  Ventricular arrhythmia: None  Conduction abnormality: None  Symptoms: A trigger events some because of palpitations patient got some APCs during those episodes.   Conclusion: APCs felt as palpitations. No recurrence of atrial fibrillation noted.  Interpreting  cardiologist: Gypsy Balsam, MD Date: 10/11/2017 10:12 AM       ______________________________________________________________________________________________           Recent Labs: 10/15/2022: NT-Pro BNP 4,009 12/06/2022: B Natriuretic Peptide 1,011.0; TSH 2.510 12/14/2022: Magnesium 2.3 01/18/2023: ALT 27 03/12/2023: BUN 40; Creatinine, Ser 2.50; Hemoglobin 10.8; Platelets 196; Potassium 3.8; Sodium 145  Recent Lipid Panel    Component Value Date/Time   CHOL 125 09/05/2019 0842   TRIG 162 (H) 09/05/2019 0842   HDL 59 09/05/2019 0842   CHOLHDL 2.1 09/05/2019 0842   LDLCALC 39 09/05/2019 0842     Risk Assessment/Calculations:    CHA2DS2-VASc Score = 9   This indicates a 12.2% annual risk of stroke. The patient's score is based upon: CHF History: 1 HTN History: 1 Diabetes History: 1 Stroke History: 2 Vascular Disease History: 1 Age Score: 2 Gender Score: 1               Physical Exam:    VS:  BP 130/88   Pulse 92   Ht 5\' 3"  (1.6 m)   Wt 160 lb 3.2 oz (72.7 kg)   SpO2 98%   BMI 28.38 kg/m     Wt Readings from Last 3 Encounters:  05/13/23 160 lb 3.2 oz (72.7 kg)  05/10/23 159 lb 3.2 oz (72.2 kg)  03/12/23 165 lb (74.8 kg)     GEN:  Well nourished, well developed in no acute distress HEENT: Normal NECK: No JVD; No carotid bruits LYMPHATICS: No lymphadenopathy CARDIAC: RRR, distant heart sounds but there is a 2/6 holosystolic murmur along the left mid-axillary line RESPIRATORY:  Clear to auscultation without rales, wheezing or rhonchi  ABDOMEN: Soft, non-tender, non-distended MUSCULOSKELETAL:  No edema; No deformity  SKIN: Warm and dry NEUROLOGIC:  Alert and oriented x 3 PSYCHIATRIC:  Normal affect   ASSESSMENT:    1. Nonrheumatic mitral valve regurgitation   2. Pre-procedural cardiovascular examination    PLAN:    In order of problems listed above:  The patient has severe, symptomatic mitral and tricuspid regurgitation.  This is associated with NYHA functional  class II symptoms of fatigue and exertional dyspnea.  We discussed the natural history of both mitral and tricuspid regurgitation today, implications with respect to functional capacity, heart failure, relationship to atrial fibrillation and pulmonary hypertension, as well as treatment options.  Specific treatment options were discussed and these include palliative medical therapy, surgical treatment which would include at least mitral and tricuspid valve repairs, and transcatheter therapies such as TEER of the mitral and/or tricuspid valves.  In review of old echo studies, the patient does have progressive mitral and tricuspid regurgitation.  Back in 2019 she had only mild mitral regurgitation and no significant tricuspid regurgitation.  However, it appears her mitral regurgitation has been present for at least a few years.  Back in 2022 when she had a TEE she was noted to have moderately severe tricuspid regurgitation and severe mitral regurgitation with A2 prolapse, Pisa radius of 0.9 cm, and an ERO of 0.39 cm.  In review of her most recent TEE performed last year, she has normal LV function, a well-seated Watchman device without PERI  device leak, and at least moderately severe mitral and tricuspid regurgitation.  I think she has a combination of atrial mitral regurgitation along with some degenerative changes of the valve leaflets.  Her tricuspid regurgitation also is likely atrial but in addition she has pacemaker leads across the tricuspid valve.  I think an updated TEE study will be helpful to best understand the functional anatomy of both her mitral and tricuspid valves and better define her treatment options.  I do not think she would be a candidate for conventional surgery, but transcatheter therapies would be a reasonable consideration.  Once her TEE study is completed, her case will be reviewed by our multidisciplinary heart valve team and we will provide further recommendations.  I have some concern  about her age and presence of significant CKD stage IV, so I am going to hold on doing a right and left heart catheterization until we see her TEE result.  If she is not felt to have good anatomy for transcatheter valve therapies, there would be no benefit in performing cardiac catheterization.  This is all discussed at length with the patient and her daughter who understand and agree to proceed with transesophageal echo study by one of our structural heart imaging specialists.      Informed Consent   Shared Decision Making/Informed Consent   The risks [esophageal damage, perforation (1:10,000 risk), bleeding, pharyngeal hematoma as well as other potential complications associated with conscious sedation including aspiration, arrhythmia, respiratory failure and death], benefits (treatment guidance and diagnostic support) and alternatives of a transesophageal echocardiogram were discussed in detail with Ms. Rosalio Macadamia and she is willing to proceed.        Medication Adjustments/Labs and Tests Ordered: Current medicines are reviewed at length with the patient today.  Concerns regarding medicines are outlined above.  Orders Placed This Encounter  Procedures   Basic metabolic panel with GFR   No orders of the defined types were placed in this encounter.   Patient Instructions  Lab Work: BMET within 7 days If you have labs (blood work) drawn today and your tests are completely normal, you will receive your results only by: MyChart Message (if you have MyChart) OR A paper copy in the mail If you have any lab test that is abnormal or we need to change your treatment, we will call you to review the results.  Testing/Procedures: Transesophageal Echocardiogram Your physician has requested that you have a TEE. During a TEE, sound waves are used to create images of your heart. It provides your doctor with information about the size and shape of your heart and how well your heart's chambers and valves  are working. In this test, a transducer is attached to the end of a flexible tube that's guided down your throat and into your esophagus (the tube leading from you mouth to your stomach) to get a more detailed image of your heart. You are not awake for the procedure. Please see the instruction sheet given to you today. For further information please visit [email or web address]  Follow-Up: At [facility], you and your health needs are our priority.  As part of our continuing mission to provide you with exceptional heart care, our providers are all part of one team.  This team includes your primary Cardiologist (physician) and Advanced Practice Providers or APPs (Physician Assistants and Nurse Practitioners) who all work together to provide you with the care you need, when you need it.  Your next appointment:   Structural Team will  follow-up  Provider:   Tonny Bollman, MD    Other Instructions     Dear [name]  You are scheduled for a TEE (Transesophageal Echocardiogram) on Monday, April 28 with Dr. Royann Shivers.  Please arrive at the [facility] (Main Entrance A) at [facility]: [address] [place], [place] [address] at 7:30 AM (This time is one hour(s) before your procedure to ensure your preparation).   Free valet parking service is available. You will check in at ADMITTING.  *Please Note: You will receive a call the day before your procedure to confirm the appointment time. That time may have changed from the original time based on the schedule for that day.*   DIET:  Nothing to eat or drink after midnight except a sip of water with medications (see medication instructions below)  MEDICATION INSTRUCTIONS: !!IF ANY NEW MEDICATIONS ARE STARTED AFTER TODAY, PLEASE NOTIFY YOUR PROVIDER AS SOON AS POSSIBLE!!  FYI: Medications such as Semaglutide (Ozempic, [place]), Tirzepatide (Mounjaro, Zepbound), Dulaglutide (Trulicity), etc ("GLP1 agonists") AND Canagliflozin  (Invokana), Dapagliflozin (Farxiga), Empagliflozin (Jardiance), Ertugliflozin (Steglatro), Bexagliflozin [employer]) or any combination with one of these drugs such as Invokamet (Canagliflozin/Metformin), Synjardy (Empagliflozin/Metformin), etc ("SGLT2 inhibitors") must be held around the time of a procedure. This is not a comprehensive list of all of these drugs. Please review all of your medications and talk to your provider if you take any one of these. If you are not sure, ask your provider.   DO NOT TAKE Torsemide the day of procedure  LABS: BMET the day of procedure  FYI:  For your safety, and to allow [place] to monitor your vital signs accurately during the surgery/procedure we request: If you have artificial nails, gel coating, SNS etc, please have those removed prior to your surgery/procedure. Not having the nail coverings /polish removed may result in cancellation or delay of your surgery/procedure.  Your support person will be asked to wait in the waiting room during your procedure.  It is OK to have someone drop you off and come back when you are ready to be discharged.  You cannot drive after the procedure and will need someone to drive you home.  Bring your insurance cards.  *Special Note: Every effort is made to have your procedure done on time. Occasionally there are emergencies that occur at the hospital that may cause delays. Please be patient if a delay does occur.        1st Floor: - Lobby - Registration  - Pharmacy  - Lab - Cafe  2nd Floor: - PV Lab - Diagnostic Testing (echo, CT, nuclear med)  3rd Floor: - Vacant  4th Floor: - TCTS (cardiothoracic surgery) - AFib Clinic - Structural Heart Clinic - Vascular Surgery  - Vascular Ultrasound  5th Floor: - HeartCare Cardiology (general and EP) - Clinical Pharmacy for coumadin, hypertension, lipid, weight-loss medications, and med management appointments    Valet parking services will be available as well.      Signed, Tonny Bollman, MD  05/14/2023 12:04 PM    [facility]

## 2023-05-13 NOTE — H&P (View-Only) (Signed)
 Cardiology Office Note:    Date:  05/14/2023   ID:  Amanda [name], DOB 1940/05/19, MRN [id number]  PCP:  Street, Renford Cartwright, MD   Amanda [name] Cardiologist:  Amanda Burger, MD Electrophysiologist:  Amanda Cortland Ding, MD     Referring MD: Amanda [name], *   Chief Complaint  Patient presents with   Mitral Regurgitation    History of Present Illness:    Amanda [name] is a 83 y.o. female referred for evaluation of mitral regurgitation by Dr. Gordan [name].  She she is here with her daughter, Amanda [name], today.  The patient has a history of permanent atrial fibrillation status post watchman implantation.  She has significant comorbidities including stage IV chronic kidney disease, coronary artery disease, complete heart block status post permanent pacemaker, type 2 diabetes, and obstructive sleep apnea.  She was found to have severe mitral regurgitation on an echocardiogram done in January of this year and she is referred for further evaluation.  Her echocardiogram demonstrated normal LVEF of 60 to 65%, normal RV systolic function, moderately elevated pulmonary artery pressure, moderate to severe tricuspid regurgitation, and moderate to severe mitral regurgitation.  The patient's symptoms include fatigue and exertional dyspnea.  She does not notice a big change in her symptoms which have been chronic over the past few years.  She has not been hospitalized for heart failure in several years.  She has been managed medically and feels like she is doing fairly well.  She denies lightheadedness, syncope, or chest pain.  No orthopnea or PND.  She has no history of rheumatic heart disease.  She does have a history of atrial fibrillation and has undergone multiple cardioversion procedures over the years, now with permanent atrial fibrillation.  Her primary complaint is fatigue and exercise intolerance.  Past Medical History:  Diagnosis Date   (HFpEF) heart failure with  preserved ejection fraction (HCC) 12/13/2022   Abnormal nuclear stress test 06/23/2016   Overview:   Added automatically from request for surgery [id number]     Acute ischemic stroke [facility])    Acute kidney failure, unspecified (HCC) 02/02/2022   Acute metabolic encephalopathy    Acute on chronic combined systolic and diastolic hrt fail (HCC) 12/16/2022   Acute on chronic systolic (congestive) heart failure (HCC)    Acute renal insufficiency 09/02/2016   Acute respiratory failure with hypoxia (HCC) 12/16/2022   Age-related osteoporosis without current pathological fracture 02/02/2022   AKI (acute kidney injury) (HCC) 09/01/2016   Anemia due to stage 4 chronic kidney disease (HCC) 04/04/2015   Anemia in chronic kidney disease 02/02/2022   Atherosclerosis of aorta (HCC) 02/02/2022   Athscl heart disease of native coronary artery w/o ang pctrs 02/02/2022   Atrial fibrillation (HCC)    Atrial fibrillation with RVR (HCC) 12/06/2022   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) 04/04/2015   Bradycardia 09/17/2014   Chest pain 06/23/2016   Overview:  Added automatically from request for surgery [id number]   Chronic anemia    Chronic kidney disease (CKD), stage III (moderate) (HCC)    Chronic kidney disease, stage 4 (severe) (HCC) 02/02/2022   Chronic systolic CHF (congestive heart failure) (HCC)    Closed left hip fracture (HCC) 03/23/2021   Constipation 03/28/2021   Constipation, unspecified 02/02/2022   Coronary artery disease 10/12/2016   Non drug-eluting stent implanted in June 2018 to mid RCA   08/08/2016 10:37  Angiographic Findings  Cardiac Arteries and Lesion Findings LMCA: 0% and  Normal. LAD: 0% and Normal. RCA:   Lesion on Mid RCA: Mid subsection.95% stenosis reduced to 0%. Pre procedure   TIMI II flow was noted. Post Procedure TIMI III flow was present. Poor run   off was present. The lesion was diagnosed as High Risk (C).    Diabetes mellitus with stage 4 chronic  kidney disease GFR 15-29 (HCC) 04/04/2015   Diabetes mellitus without complication (HCC)    Diverticulosis    Dyslipidemia 09/17/2014   Gastro-esophageal reflux disease without esophagitis 02/02/2022   Gastroesophageal reflux    GI bleed 08/06/2016   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   History of cardiac monitoring 02/23/2018   monitor inserted   History of falling 02/02/2022   History of iron deficiency anemia 12/28/2017   History of transesophageal echocardiography (TEE)    Hypercalcemia 04/04/2015   Hyperlipidemia    Hyperlipidemia, unspecified 02/02/2022   Hypertensive disorder 07/21/2017   Hypertensive heart disease with heart failure (HCC)    Hypothyroidism, unspecified 02/02/2022   Iron deficiency anemia 12/28/2017   Iron deficiency anemia, unspecified 02/02/2022   Ischemic cardiomyopathy 02/02/2022   Kidney disease 07/21/2017   Long term (current) use of insulin  (HCC) 02/02/2022   LV dysfunction 09/02/2016   Mitral regurgitation severe based on TEE from 2022 April 06/10/2020   Myocardial infarction [facility])    NSTEMI (non-ST elevated myocardial infarction) (HCC) 08/06/2016   Obstructive sleep apnea 09/17/2014   Obstructive sleep apnea (adult) (pediatric) 02/02/2022   Old myocardial infarction 02/02/2022   Orthostatic hypotension 12/28/2017   OSA on CPAP    Other specified heart block 02/02/2022   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   Paroxysmal atrial flutter (HCC) 07/22/2022   Personal history of other diseases of the digestive system 02/02/2022   Postural dizziness with presyncope 09/23/2017   Presence of cardiac pacemaker 02/02/2022   Presence of other cardiac implants and grafts 02/02/2022   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/24/2017   Prsnl hx of TIA (TIA), and cereb infrc w/o resid deficits 02/02/2022   Recurrent left pleural effusion 03/14/2015   Renal failure, chronic, stage 3 (moderate) (HCC)    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 02/02/2022   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   Secondary hypercoagulable state (HCC) 04/23/2021   Syncope 11/04/2017   Systolic heart failure (HCC)    Thyroid  disease    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   Unspecified atrial fibrillation (HCC) 12/16/2022   Vascular disorder of intestine, unspecified (HCC) 02/02/2022   Vitamin D  deficiency 04/04/2015    Past Surgical History:  Procedure Laterality Date   BIOPSY  02/15/2023   Procedure: BIOPSY;  Surgeon: Lajuan Pila, MD;  Location: [facility] ENDOSCOPY;  Service: Gastroenterology;;   Alford Im STUDY  05/16/2020   Procedure: BUBBLE STUDY;  Surgeon: Euell Herrlich, MD;  Location: [facility] ENDOSCOPY;  Service: Cardiovascular;;   CARDIAC CATHETERIZATION     CARDIOVERSION N/A 05/16/2020   Procedure: CARDIOVERSION;  Surgeon: Euell Herrlich, MD;  Location: [facility] ENDOSCOPY;  Service: Cardiovascular;  Laterality: N/A;   CARDIOVERSION N/A 09/06/2020   Procedure: CARDIOVERSION;  Surgeon: Wendie Hamburg, MD;  Location: [facility] ENDOSCOPY;  Service: Cardiovascular;  Laterality: N/A;   CARDIOVERSION N/A 05/16/2021   Procedure: CARDIOVERSION;  Surgeon: Jacqueline Matsu, MD;  Location: [facility] ENDOSCOPY;  Service: Cardiovascular;  Laterality: N/A;   CARDIOVERSION N/A 08/12/2022   Procedure: CARDIOVERSION;  Surgeon: Jerryl Morin, DO;  Location: [facility] INVASIVE CV  LAB;  Service: Cardiovascular;  Laterality: N/A;   CARDIOVERSION N/A 09/30/2022   Procedure: CARDIOVERSION;  Surgeon: Sheryle Donning, MD;  Location: [facility] INVASIVE CV LAB;  Service: Cardiovascular;  Laterality: N/A;   CARDIOVERSION N/A 12/08/2022   Procedure: CARDIOVERSION (CATH LAB);  Surgeon: Jann Melody, MD;  Location: [facility] INVASIVE CV LAB;  Service: Cardiovascular;  Laterality: N/A;   CARDIOVERSION N/A 12/14/2022   Procedure: CARDIOVERSION (CATH LAB);  Surgeon: Sheryle Donning, MD;  Location: [facility] INVASIVE  CV LAB;  Service: Cardiovascular;  Laterality: N/A;   COLONOSCOPY  11/21/2014   Moderate predominantly sigmoid diverticulosis. Otherwise noraml collonscopy to TI.    CORONARY ANGIOPLASTY WITH STENT PLACEMENT  08/2016   ESOPHAGOGASTRODUODENOSCOPY (EGD) WITH PROPOFOL  N/A 02/15/2023   Procedure: ESOPHAGOGASTRODUODENOSCOPY (EGD) WITH PROPOFOL ;  Surgeon: Lajuan Pila, MD;  Location: [facility] ENDOSCOPY;  Service: Gastroenterology;  Laterality: N/A;   EXCISIONAL HEMORRHOIDECTOMY     LEFT ATRIAL APPENDAGE OCCLUSION  11/2016   in [place]   LOOP RECORDER INSERTION N/A 02/23/2018   Procedure: LOOP RECORDER INSERTION;  Surgeon: Lei Pump, MD;  Location: [facility] INVASIVE CV LAB;  Service: Cardiovascular;  Laterality: N/A;   LOOP RECORDER REMOVAL N/A 04/24/2019   Procedure: LOOP RECORDER REMOVAL;  Surgeon: Lei Pump, MD;  Location: [facility] INVASIVE CV LAB;  Service: Cardiovascular;  Laterality: N/A;   MALONEY DILATION N/A 02/15/2023   Procedure: Londa Rival DILATION;  Surgeon: Lajuan Pila, MD;  Location: [facility] ENDOSCOPY;  Service: Gastroenterology;  Laterality: N/A;   NOSE SURGERY     PACEMAKER IMPLANT N/A 04/24/2019   Procedure: PACEMAKER IMPLANT;  Surgeon: Lei Pump, MD;  Location: [facility] INVASIVE CV LAB;  Service: Cardiovascular;  Laterality: N/A;   TEE WITH CARDIOVERSION     TEE WITHOUT CARDIOVERSION N/A 05/16/2020   Procedure: TRANSESOPHAGEAL ECHOCARDIOGRAM (TEE);  Surgeon: Euell Herrlich, MD;  Location: [facility] ENDOSCOPY;  Service: Cardiovascular;  Laterality: N/A;   TEE WITHOUT CARDIOVERSION N/A 08/12/2022   Procedure: TRANSESOPHAGEAL ECHOCARDIOGRAM;  Surgeon: Jerryl Morin, DO;  Location: [facility] INVASIVE CV LAB;  Service: Cardiovascular;  Laterality: N/A;   TEE WITHOUT CARDIOVERSION N/A 09/30/2022   Procedure: TRANSESOPHAGEAL ECHOCARDIOGRAM;  Surgeon: Sheryle Donning, MD;  Location: [facility] INVASIVE CV LAB;  Service: Cardiovascular;  Laterality: N/A;   TOTAL HIP REVISION Left 03/25/2021    Procedure: LEFT POSTERIOR TOTAL HIP  ZIMMER CABLES;  Surgeon: Claiborne Crew, MD;  Location: [facility] ORS;  Service: Orthopedics;  Laterality: Left;    Current Medications: Current Meds  Medication Sig   amiodarone  (PACERONE ) 200 MG tablet Take 0.5 tablets (100 mg total) by mouth daily.   amLODipine  (NORVASC ) 10 MG tablet Take 10 mg by mouth daily.   Biotin 1000 MCG tablet Take 1,000 mcg by mouth daily.   blood glucose meter kit and supplies KIT Dispense based on patient and insurance preference. Use up to four times daily as directed. (Patient taking differently: Inject 1 each into the skin See admin instructions. Dispense based on patient and insurance preference. Use up to four times daily as directed.)   Boswellia-Glucosamine-Vit D (OSTEO BI-FLEX-GLUCOS/5-LOXIN PO) Take by mouth. Taking 1 tablet at noon   carboxymethylcellul-glycerin (REFRESH RELIEVA) 0.5-0.9 % ophthalmic solution Place 1 drop into both eyes 4 (four) times daily as needed for dry eyes. VE   Cholecalciferol  (VITAMIN D3) 50 MCG (2000 UT) TABS Take 2,000 Units by mouth daily at 12 noon.   cloNIDine  (CATAPRES ) 0.1 MG tablet Take 0.1 mg by mouth 2 (two) times daily.   Coenzyme Q10 100 MG capsule  Take 100 mg by mouth 2 (two) times daily.   denosumab  (PROLIA ) 60 MG/ML SOSY injection Inject 60 mg into the skin every 6 (six) months.   estradiol  (ESTRACE ) 0.1 MG/GM vaginal cream Place 1 Applicatorful vaginally every Monday, Wednesday, and Friday.    Evolocumab  (REPATHA  SURECLICK) 140 MG/ML SOAJ Inject 140 mg into the skin every 14 (fourteen) days.   famotidine  (PEPCID ) 40 MG tablet Take 40 mg by mouth daily.   febuxostat  (ULORIC ) 40 MG tablet Take 40 mg by mouth daily.   ferrous sulfate  325 (65 FE) MG EC tablet Take 325 mg by mouth 2 (two) times daily with breakfast and lunch.   folic acid  (FOLVITE ) 800 MCG tablet Take 800 mcg by mouth daily at 12 noon.   Glucosamine-Chondroitin (OSTEO BI-FLEX REGULAR STRENGTH PO) Take 25 mcg by mouth daily  at 12 noon. Plus D3   HUMALOG KWIKPEN 100 UNIT/ML KwikPen Inject 2-6 Units into the skin as directed. Per sliding scale   hydrALAZINE  (APRESOLINE ) 50 MG tablet Take 1 tablet (50 mg total) by mouth every 6 (six) hours.   isosorbide  mononitrate (IMDUR ) 60 MG 24 hr tablet Take 60 mg by mouth daily.   Lactobacillus (FLORAJEN ACIDOPHILUS) CAPS Take 1 capsule by mouth daily at 12 noon. 390 mg each   levothyroxine  (SYNTHROID , LEVOTHROID) 50 MCG tablet Take 50 mcg by mouth daily before breakfast.   metoprolol  tartrate (LOPRESSOR ) 25 MG tablet Take 25 mg by mouth 2 (two) times daily.   Multiple Vitamins-Minerals (PRESERVISION AREDS 2) CAPS Take 1 capsule by mouth 2 (two) times daily.   nitroGLYCERIN (NITROSTAT) 0.4 MG SL tablet Place 0.4 mg under the tongue every 5 (five) minutes as needed for chest pain.   Nutritional Supplements (GLUCERNA MEAL PO) Take by mouth. Patient taking 237 ml three times a day   omega-3 acid ethyl esters (LOVAZA ) 1 g capsule Take 2 capsules (2 g total) by mouth daily.   pantoprazole  (PROTONIX ) 40 MG tablet Take 1 tablet (40 mg total) by mouth daily.   Polyethylene Glycol 3350  (MIRALAX  PO) Take by mouth as needed.   pravastatin  (PRAVACHOL ) 40 MG tablet Take 40 mg by mouth every other day.   Probiotic Product (FLORAJEN3 PO) Take by mouth daily.   ranolazine  (RANEXA ) 500 MG 12 hr tablet Take 1 tablet (500 mg total) by mouth 2 (two) times daily.   torsemide  (DEMADEX ) 20 MG tablet Take 20 mg by mouth 2 (two) times daily.   vitamin B-12 (CYANOCOBALAMIN ) 1000 MCG tablet Take 1,000 mcg by mouth in the morning and at bedtime.   Wheat Dextrin (BENEFIBER PO) Take 3 tablets by mouth daily.     Allergies:   Ciprofloxacin, Contrast media [iodinated contrast media], Crestor [rosuvastatin], Lipitor [atorvastatin], Nsaids, and Statins   Social History   Socioeconomic History   Marital status: Married    Spouse name: Not on file   Number of children: 2   Years of education: Not on file    Highest education level: Not on file  Occupational History   Occupation: Retired   Tobacco Use   Smoking status: Former   Smokeless tobacco: Former    Quit date: 1997   Tobacco comments:    Former smoker 05/22/21  Vaping Use   Vaping status: Never Used  Substance and Sexual Activity   Alcohol use: Yes    Alcohol/week: 2.0 standard drinks of alcohol    Types: 2 Standard drinks or equivalent per week    Comment: Occ   Drug use:  No   Sexual activity: Not on file  Other Topics Concern   Not on file  Social History Narrative   Watchman device 2018   Social Drivers of Health   Financial Resource Strain: Not on file  Food Insecurity: No Food Insecurity (12/07/2022)   Hunger Vital Sign    Worried About Running Out of Food in the Last Year: Never true    Ran Out of Food in the Last Year: Never true  Transportation Needs: No Transportation Needs (12/07/2022)   PRAPARE - Administrator, Civil Service (Medical): No    Lack of Transportation (Non-Medical): No  Physical Activity: Not on file  Stress: Not on file  Social Connections: Not on file     Family History: The patient's family history includes Breast cancer in her mother; Cancer in her maternal grandfather; Diabetes in her sister; Heart attack in her brother, maternal uncle, and sister; Hypertension in her father; Prostate cancer in her father; Rectal cancer in her maternal uncle; Stroke in her father and maternal uncle. There is no history of Esophageal cancer, Pancreatic cancer, or Stomach cancer.  ROS:   Please see the history of present illness.    All other systems reviewed and are negative.  EKGs/Labs/Other Studies Reviewed:    The following studies were reviewed today: Cardiac Studies & Procedures   ______________________________________________________________________________________________     ECHOCARDIOGRAM  ECHOCARDIOGRAM COMPLETE 03/02/2023  Narrative ECHOCARDIOGRAM REPORT    Patient  Name:   SHANEKIA NEALEY Date of Exam: 03/02/2023 Medical Rec #:  [id number]       Height:       63.0 in Accession #:    [id number]      Weight:       169.1 lb Date of Birth:  [birth year]      BSA:          1.800 m Patient Age:    82 years        BP:           126/52 mmHg Patient Gender: F               HR:           55 bpm. Exam Location:    Procedure: 2D Echo, Cardiac Doppler, Color Doppler and Strain Analysis  Indications:    Nonrheumatic mitral valve regurgitation [I34.0 (ICD-10-CM)]  History:        Patient has prior history of Echocardiogram examinations, most recent 12/18/2022. CHF, CAD and Previous Myocardial Infarction, Pacemaker, Stroke, Mitral Valve Disease, Arrythmias:Atrial Fibrillation, Signs/Symptoms:Syncope; Risk Factors:Hypertension, Diabetes and Former Smoker.  Sonographer:    Erminia Hazel RDCS Referring Phys: [id number] ROBERT J KRASOWSKI  IMPRESSIONS   1. Left ventricular ejection fraction, by estimation, is 60 to 65%. The left ventricle has normal function. The left ventricle has no regional wall motion abnormalities. There is mild left ventricular hypertrophy. Left ventricular diastolic parameters are indeterminate. The average left ventricular global longitudinal strain is -10.2 %. The global longitudinal strain is abnormal. 2. Right ventricular systolic function is normal. The right ventricular size is mildly enlarged. There is moderately elevated pulmonary artery systolic pressure. 3. Left atrial size was mild to moderately dilated. 4. Right atrial size was severely dilated. 5. The mitral valve is normal in structure. Moderate to severe mitral valve regurgitation. No evidence of mitral stenosis. 6. Tricuspid valve regurgitation is moderate to severe. 7. The aortic valve is normal in structure. Aortic valve regurgitation is not visualized. Aortic valve sclerosis/calcification  is present, without any evidence of aortic stenosis. 8. The inferior vena cava is  normal in size with greater than 50% respiratory variability, suggesting right atrial pressure of 3 mmHg.  FINDINGS Left Ventricle: Left ventricular ejection fraction, by estimation, is 60 to 65%. The left ventricle has normal function. The left ventricle has no regional wall motion abnormalities. The average left ventricular global longitudinal strain is -10.2 %. The global longitudinal strain is abnormal. The left ventricular internal cavity size was normal in size. There is mild left ventricular hypertrophy. Left ventricular diastolic parameters are indeterminate.  Right Ventricle: The right ventricular size is mildly enlarged. No increase in right ventricular wall thickness. Right ventricular systolic function is normal. There is moderately elevated pulmonary artery systolic pressure. The tricuspid regurgitant velocity is 3.75 m/s, and with an assumed right atrial pressure of 3 mmHg, the estimated right ventricular systolic pressure is 59.2 mmHg.  Left Atrium: Left atrial size was mild to moderately dilated.  Right Atrium: Right atrial size was severely dilated.  Pericardium: There is no evidence of pericardial effusion.  Mitral Valve: The mitral valve is normal in structure. Moderate to severe mitral valve regurgitation. No evidence of mitral valve stenosis.  Tricuspid Valve: The tricuspid valve is normal in structure. Tricuspid valve regurgitation is moderate to severe. No evidence of tricuspid stenosis.  Aortic Valve: The aortic valve is normal in structure. Aortic valve regurgitation is not visualized. Aortic valve sclerosis/calcification is present, without any evidence of aortic stenosis.  Pulmonic Valve: The pulmonic valve was normal in structure. Pulmonic valve regurgitation is not visualized. No evidence of pulmonic stenosis.  Aorta: The aortic root is normal in size and structure.  Venous: The inferior vena cava is normal in size with greater than 50% respiratory variability,  suggesting right atrial pressure of 3 mmHg.  IAS/Shunts: No atrial level shunt detected by color flow Doppler.   LEFT VENTRICLE PLAX 2D LVIDd:         3.80 cm   Diastology LVIDs:         2.60 cm   LV e' medial:    6.31 cm/s LV PW:         1.30 cm   LV E/e' medial:  24.1 LV IVS:        1.30 cm   LV e' lateral:   9.46 cm/s LVOT diam:     1.80 cm   LV E/e' lateral: 16.1 LV SV:         41 LV SV Index:   23        2D Longitudinal Strain LVOT Area:     2.54 cm  2D Strain GLS Avg:     -10.2 %   RIGHT VENTRICLE            IVC RV Basal diam:  4.60 cm    IVC diam: 2.00 cm RV Mid diam:    4.00 cm RV S prime:     8.38 cm/s TAPSE (M-mode): 1.8 cm  LEFT ATRIUM             Index        RIGHT ATRIUM           Index LA diam:        5.10 cm 2.83 cm/m   RA Area:     29.80 cm LA Vol (A2C):   50.6 ml 28.10 ml/m  RA Volume:   102.00 ml 56.65 ml/m LA Vol (A4C):   67.0 ml 37.21 ml/m LA Biplane  Vol: 58.1 ml 32.27 ml/m AORTIC VALVE LVOT Vmax:   75.05 cm/s LVOT Vmean:  49.250 cm/s LVOT VTI:    0.163 m  AORTA Ao Root diam: 3.00 cm Ao Asc diam:  3.30 cm Ao Desc diam: 1.80 cm  MITRAL VALVE                  TRICUSPID VALVE MV Area (PHT): 3.87 cm       TR Peak grad:   56.2 mmHg MV Decel Time: 196 msec       TR Vmax:        375.00 cm/s MR Peak grad:    147.4 mmHg MR Mean grad:    97.0 mmHg    SHUNTS MR Vmax:         607.00 cm/s  Systemic VTI:  0.16 m MR Vmean:        465.5 cm/s   Systemic Diam: 1.80 cm MR PISA:         3.08 cm MR PISA Eff ROA: 20 mm MR PISA Radius:  0.70 cm MV E velocity: 152.00 cm/s MV A velocity: 61.60 cm/s MV E/A ratio:  2.47  Amanda Burger MD Electronically signed by Amanda Burger MD Signature Date/Time: 03/02/2023/2:06:12 PM    Final   TEE  ECHO TEE 09/30/2022  Narrative TRANSESOPHOGEAL ECHO REPORT    Patient Name:   YASLENE MOIX Date of Exam: 09/30/2022 Medical Rec #:  [id number]       Height:       63.0 in Accession #:    [id number]       Weight:       179.9 lb Date of Birth:  1941/02/01      BSA:          1.849 m Patient Age:    81 years        BP:           151/100 mmHg Patient Gender: F               HR:           74 bpm. Exam Location:  Outpatient  Procedure: Transesophageal Echo, 3D Echo, Color Doppler and Cardiac Doppler  Indications:     thrombus on pacemaker leads  History:         Patient has prior history of Echocardiogram examinations, most recent 08/12/2022. CAD, Pacemaker, chronic kidney disease, Arrythmias:Atrial Fibrillation; Risk Factors:Diabetes, Dyslipidemia, Sleep Apnea and Hypertension.  Sonographer:     Dione Franks RDCS Referring Phys:  [id number] BRIDGETTE CHRISTOPHER Diagnosing Phys: Sheryle Donning MD  PROCEDURE: After discussion of the risks and benefits of a TEE, an informed consent was obtained from the patient. The transesophogeal probe was passed without difficulty through the esophogus of the patient. Imaged were obtained with the patient in a left lateral decubitus position. Sedation performed by different physician. The patient was monitored while under deep sedation. Anesthestetic sedation was provided intravenously by Anesthesiology: 270mg  of Propofol , 80mg  of Lidocaine . The patient developed no complications during the procedure. A successful direct current cardioversion was performed at 120 joules with 1 attempt.  IMPRESSIONS   1. Left ventricular ejection fraction, by estimation, is 55 to 60%. The left ventricle has normal function. The left ventricle has no regional wall motion abnormalities. 2. Right ventricular systolic function is normal. The right ventricular size is normal. 3. Watchman device present in LAA, well seated without residual flow. Left atrial size was mildly dilated. No left atrial/left atrial appendage thrombus  was detected. 4. The mitral valve is degenerative. Moderate to severe mitral valve regurgitation. No evidence of mitral stenosis. 5. Tricuspid valve  regurgitation is severe. 6. The aortic valve is tricuspid. There is mild calcification of the aortic valve. Aortic valve regurgitation is not visualized. No aortic stenosis is present. 7. There is Severe (Grade IV) atheroma plaque involving the descending aorta. 8. Agitated saline contrast bubble study was negative, with no evidence of any interatrial shunt.  Comparison(s): Fibrinous strands on pacer leads not consistent with thrombus. No right to left shunt. Successful cardioversion.  FINDINGS Left Ventricle: Left ventricular ejection fraction, by estimation, is 55 to 60%. The left ventricle has normal function. The left ventricle has no regional wall motion abnormalities. The left ventricular internal cavity size was normal in size.  Right Ventricle: There are two small fibrinous strands, one on the RA lead and one on the RV lead. These appear similar to prior. The right ventricular size is normal. No increase in right ventricular wall thickness. Right ventricular systolic function is normal.  Left Atrium: Watchman device present in LAA, well seated without residual flow. Left atrial size was mildly dilated. No left atrial/left atrial appendage thrombus was detected.  Right Atrium: Right atrial size was normal in size.  Pericardium: There is no evidence of pericardial effusion.  Mitral Valve: The mitral valve is degenerative in appearance. Moderate to severe mitral valve regurgitation. No evidence of mitral valve stenosis. There is no evidence of mitral valve vegetation.  Tricuspid Valve: The tricuspid valve is normal in structure. Tricuspid valve regurgitation is severe. No evidence of tricuspid stenosis. There is no evidence of tricuspid valve vegetation.  Aortic Valve: The aortic valve is tricuspid. There is mild calcification of the aortic valve. Aortic valve regurgitation is not visualized. No aortic stenosis is present. There is no evidence of aortic valve vegetation.  Pulmonic  Valve: The pulmonic valve was grossly normal. Pulmonic valve regurgitation is trivial. No evidence of pulmonic stenosis. There is no evidence of pulmonic valve vegetation.  Aorta: The aortic root and ascending aorta are structurally normal, with no evidence of dilitation. 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  IAS/Shunts: No atrial level shunt detected by color flow Doppler. Agitated saline contrast was given intravenously to evaluate for intracardiac shunting. Agitated saline contrast bubble study was negative, with no evidence of any interatrial shunt.  Additional Comments: A device lead is visualized in the right atrium and right ventricle. Spectral Doppler performed.  MR Peak grad:    130.4 mmHg MR Mean grad:    91.0 mmHg MR Vmax:         571.00 cm/s MR Vmean:        459.0 cm/s MR PISA:         1.90 cm MR PISA Eff ROA: 13 mm MR PISA Radius:  0.55 cm  Sheryle Donning MD Electronically signed by Sheryle Donning MD Signature Date/Time: 10/01/2022/12:50:36 PM    Final  MONITORS  CARDIAC EVENT MONITOR 09/01/2017  Narrative King Penning, DOB 02/26/1940, MRN [id number]  EVENT MONITOR REPORT:   Patient was monitored from 09/01/2017 to 09/30/2017. Indication:                    Paroxysmal atrial fibrillation Ordering physician:  Manfred Seed, MD Referring physician:  Manfred Seed MD  Patient wear event recorder for 26 days.  The eighth trigger events.   Baseline rhythm: Sinus rhythm   Atrial arrhythmia:.  Of APCs  felt as fluttering or palpitations.  No clear-cut sustained arrhythmia.  No clear-cut atrial fibrillation identified  Ventricular arrhythmia: None  Conduction abnormality: None  Symptoms: A trigger events some because of palpitations patient got some APCs during those episodes.   Conclusion: APCs felt as palpitations. No recurrence of atrial fibrillation noted.  Interpreting  cardiologist: Amanda Burger, MD Date: 10/11/2017 10:12 AM       ______________________________________________________________________________________________           Recent Labs: 10/15/2022: NT-Pro BNP 4,009 12/06/2022: B Natriuretic Peptide 1,011.0; TSH 2.510 12/14/2022: Magnesium 2.3 01/18/2023: ALT 27 03/12/2023: BUN 40; Creatinine, Ser 2.50; Hemoglobin 10.8; Platelets 196; Potassium 3.8; Sodium 145  Recent Lipid Panel    Component Value Date/Time   CHOL 125 09/05/2019 0842   TRIG 162 (H) 09/05/2019 0842   HDL 59 09/05/2019 0842   CHOLHDL 2.1 09/05/2019 0842   LDLCALC 39 09/05/2019 0842     Risk Assessment/Calculations:    CHA2DS2-VASc Score = 9   This indicates a 12.2% annual risk of stroke. The patient's score is based upon: CHF History: 1 HTN History: 1 Diabetes History: 1 Stroke History: 2 Vascular Disease History: 1 Age Score: 2 Gender Score: 1               Physical Exam:    VS:  BP 130/88   Pulse 92   Ht 5\' 3"  (1.6 m)   Wt 160 lb 3.2 oz (72.7 kg)   SpO2 98%   BMI 28.38 kg/m     Wt Readings from Last 3 Encounters:  05/13/23 160 lb 3.2 oz (72.7 kg)  05/10/23 159 lb 3.2 oz (72.2 kg)  03/12/23 165 lb (74.8 kg)     GEN:  Well nourished, well developed in no acute distress HEENT: Normal NECK: No JVD; No carotid bruits LYMPHATICS: No lymphadenopathy CARDIAC: RRR, distant heart sounds but there is a 2/6 holosystolic murmur along the left mid-axillary line RESPIRATORY:  Clear to auscultation without rales, wheezing or rhonchi  ABDOMEN: Soft, non-tender, non-distended MUSCULOSKELETAL:  No edema; No deformity  SKIN: Warm and dry NEUROLOGIC:  Alert and oriented x 3 PSYCHIATRIC:  Normal affect   ASSESSMENT:    1. Nonrheumatic mitral valve regurgitation   2. Pre-procedural cardiovascular examination    PLAN:    In order of problems listed above:  The patient has severe, symptomatic mitral and tricuspid regurgitation.  This is associated with NYHA functional  class II symptoms of fatigue and exertional dyspnea.  We discussed the natural history of both mitral and tricuspid regurgitation today, implications with respect to functional capacity, heart failure, relationship to atrial fibrillation and pulmonary hypertension, as well as treatment options.  Specific treatment options were discussed and these include palliative medical therapy, surgical treatment which would include at least mitral and tricuspid valve repairs, and transcatheter therapies such as TEER of the mitral and/or tricuspid valves.  In review of old echo studies, the patient does have progressive mitral and tricuspid regurgitation.  Back in 2019 she had only mild mitral regurgitation and no significant tricuspid regurgitation.  However, it appears her mitral regurgitation has been present for at least a few years.  Back in 2022 when she had a TEE she was noted to have moderately severe tricuspid regurgitation and severe mitral regurgitation with A2 prolapse, Pisa radius of 0.9 cm, and an ERO of 0.39 cm.  In review of her most recent TEE performed last year, she has normal LV function, a well-seated Watchman device without PERI  device leak, and at least moderately severe mitral and tricuspid regurgitation.  I think she has a combination of atrial mitral regurgitation along with some degenerative changes of the valve leaflets.  Her tricuspid regurgitation also is likely atrial but in addition she has pacemaker leads across the tricuspid valve.  I think an updated TEE study Amanda be helpful to best understand the functional anatomy of both her mitral and tricuspid valves and better define her treatment options.  I do not think she would be a candidate for conventional surgery, but transcatheter therapies would be a reasonable consideration.  Once her TEE study is completed, her case Amanda be reviewed by our multidisciplinary heart valve team and we Amanda provide further recommendations.  I have some concern  about her age and presence of significant CKD stage IV, so I am going to hold on doing a right and left heart catheterization until we see her TEE result.  If she is not felt to have good anatomy for transcatheter valve therapies, there would be no benefit in performing cardiac catheterization.  This is all discussed at length with the patient and her daughter who understand and agree to proceed with transesophageal echo study by one of our structural heart imaging specialists.      Informed Consent   Shared Decision Making/Informed Consent   The risks [esophageal damage, perforation (1:10,000 risk), bleeding, pharyngeal hematoma as well as other potential complications associated with conscious sedation including aspiration, arrhythmia, respiratory failure and death], benefits (treatment guidance and diagnostic support) and alternatives of a transesophageal echocardiogram were discussed in detail with Ms. Gaetano Jordan and she is willing to proceed.        Medication Adjustments/Labs and Tests Ordered: Current medicines are reviewed at length with the patient today.  Concerns regarding medicines are outlined above.  Orders Placed This Encounter  Procedures   Basic metabolic panel with GFR   No orders of the defined types were placed in this encounter.   Patient Instructions  Lab Work: BMET within 7 days If you have labs (blood work) drawn today and your tests are completely normal, you Amanda receive your results only by: MyChart Message (if you have MyChart) OR A paper copy in the mail If you have any lab test that is abnormal or we need to change your treatment, we Amanda call you to review the results.  Testing/Procedures: Transesophageal Echocardiogram Your physician has requested that you have a TEE. During a TEE, sound waves are used to create images of your heart. It provides your doctor with information about the size and shape of your heart and how well your heart's chambers and valves  are working. In this test, a transducer is attached to the end of a flexible tube that's guided down your throat and into your esophagus (the tube leading from you mouth to your stomach) to get a more detailed image of your heart. You are not awake for the procedure. Please see the instruction sheet given to you today. For further information please visit [email or web address]  Follow-Up: At [facility], you and your health needs are our priority.  As part of our continuing mission to provide you with exceptional heart care, our [name] are all part of one team.  This team includes your primary Cardiologist (physician) and Advanced Practice [name] or APPs (Physician Assistants and Nurse Practitioners) who all work together to provide you with the care you need, when you need it.  Your next appointment:   Structural Team Amanda  follow-up  Provider:   Arnoldo Lapping, MD    Other Instructions     Dear Rosabel Collet  You are scheduled for a TEE (Transesophageal Echocardiogram) on Monday, April 28 with Dr. Alvis Ba.  Please arrive at the [facility] (Main Entrance A) at [facility]: [address] [place], [place] [address] at 7:30 AM (This time is one hour(s) before your procedure to ensure your preparation).   Free valet parking service is available. You Amanda check in at ADMITTING.  *Please Note: You Amanda receive a call the day before your procedure to confirm the appointment time. That time may have changed from the original time based on the schedule for that day.*   DIET:  Nothing to eat or drink after midnight except a sip of water with medications (see medication instructions below)  MEDICATION INSTRUCTIONS: !!IF ANY NEW MEDICATIONS ARE STARTED AFTER TODAY, PLEASE NOTIFY YOUR PROVIDER AS SOON AS POSSIBLE!!  FYI: Medications such as Semaglutide (Ozempic, [place]), Tirzepatide (Mounjaro, Zepbound), Dulaglutide (Trulicity), etc ("GLP1 agonists") AND Canagliflozin  (Invokana), Dapagliflozin (Farxiga), Empagliflozin (Jardiance), Ertugliflozin (Steglatro), Bexagliflozin [employer]) or any combination with one of these drugs such as Invokamet (Canagliflozin/Metformin), Synjardy (Empagliflozin/Metformin), etc ("SGLT2 inhibitors") must be held around the time of a procedure. This is not a comprehensive list of all of these drugs. Please review all of your medications and talk to your provider if you take any one of these. If you are not sure, ask your provider.   DO NOT TAKE Torsemide  the day of procedure  LABS: BMET the day of procedure  FYI:  For your safety, and to allow us  to monitor your vital signs accurately during the surgery/procedure we request: If you have artificial nails, gel coating, SNS etc, please have those removed prior to your surgery/procedure. Not having the nail coverings /polish removed may result in cancellation or delay of your surgery/procedure.  Your support person Amanda be asked to wait in the waiting room during your procedure.  It is OK to have someone drop you off and come back when you are ready to be discharged.  You cannot drive after the procedure and Amanda need someone to drive you home.  Bring your insurance cards.  *Special Note: Every effort is made to have your procedure done on time. Occasionally there are emergencies that occur at the hospital that may cause delays. Please be patient if a delay does occur.        1st Floor: - Lobby - Registration  - Pharmacy  - Lab - Cafe  2nd Floor: - PV Lab - Diagnostic Testing (echo, CT, nuclear med)  3rd Floor: - Vacant  4th Floor: - TCTS (cardiothoracic surgery) - AFib Clinic - Structural Heart Clinic - Vascular Surgery  - Vascular Ultrasound  5th Floor: - HeartCare Cardiology (general and EP) - Clinical Pharmacy for coumadin , hypertension, lipid, weight-loss medications, and med management appointments    Valet parking services Amanda be available as well.      Signed, Arnoldo Lapping, MD  05/14/2023 12:04 PM    [facility]

## 2023-05-13 NOTE — Patient Instructions (Signed)
 Lab Work: BMET within 7 days If you have labs (blood work) drawn today and your tests are completely normal, you will receive your results only by: [employer] (if you have MyChart) OR A paper copy in the mail If you have any lab test that is abnormal or we need to change your treatment, we will call you to review the results.  Testing/Procedures: Transesophageal Echocardiogram Your physician has requested that you have a TEE. During a TEE, sound waves are used to create images of your heart. It provides your doctor with information about the size and shape of your heart and how well your heart's chambers and valves are working. In this test, a transducer is attached to the end of a flexible tube that's guided down your throat and into your esophagus (the tube leading from you mouth to your stomach) to get a more detailed image of your heart. You are not awake for the procedure. Please see the instruction sheet given to you today. For further information please visit [name]  Follow-Up: At [facility], you and your health needs are our priority.  As part of our continuing mission to provide you with exceptional heart care, our providers are all part of one team.  This team includes your primary Cardiologist (physician) and Advanced Practice Providers or APPs (Physician Assistants and Nurse Practitioners) who all work together to provide you with the care you need, when you need it.  Your next appointment:   Structural Team will follow-up  Provider:   Tonny Bollman, MD    Other Instructions     Dear Amanda [name]  You are scheduled for a TEE (Transesophageal Echocardiogram) on Monday, April 28 with Dr. Royann Shivers.  Please arrive at the [facility] (Main Entrance A) at [facility]: [address] [place], [place] [address] at 7:30 AM (This time is one hour(s) before your procedure to ensure your preparation).   Free valet parking service is available. You  will check in at ADMITTING.  *Please Note: You will receive a call the day before your procedure to confirm the appointment time. That time may have changed from the original time based on the schedule for that day.*   DIET:  Nothing to eat or drink after midnight except a sip of water with medications (see medication instructions below)  MEDICATION INSTRUCTIONS: !!IF ANY NEW MEDICATIONS ARE STARTED AFTER TODAY, PLEASE NOTIFY YOUR PROVIDER AS SOON AS POSSIBLE!!  FYI: Medications such as Semaglutide (Ozempic, [place]), Tirzepatide (Mounjaro, Zepbound), Dulaglutide (Trulicity), etc ("GLP1 agonists") AND Canagliflozin (Invokana), Dapagliflozin (Farxiga), Empagliflozin (Jardiance), Ertugliflozin (Steglatro), Bexagliflozin [employer]) or any combination with one of these drugs such as Invokamet (Canagliflozin/Metformin), Synjardy (Empagliflozin/Metformin), etc ("SGLT2 inhibitors") must be held around the time of a procedure. This is not a comprehensive list of all of these drugs. Please review all of your medications and talk to your provider if you take any one of these. If you are not sure, ask your provider.   DO NOT TAKE Torsemide the day of procedure  LABS: BMET the day of procedure  FYI:  For your safety, and to allow [place] to monitor your vital signs accurately during the surgery/procedure we request: If you have artificial nails, gel coating, SNS etc, please have those removed prior to your surgery/procedure. Not having the nail coverings /polish removed may result in cancellation or delay of your surgery/procedure.  Your support person will be asked to wait in the waiting room during your procedure.  It is  OK to have someone drop you off and come back when you are ready to be discharged.  You cannot drive after the procedure and will need someone to drive you home.  Bring your insurance cards.  *Special Note: Every effort is made to have your procedure done on time. Occasionally there are  emergencies that occur at the hospital that may cause delays. Please be patient if a delay does occur.        1st Floor: - Lobby - Registration  - Pharmacy  - Lab - Cafe  2nd Floor: - PV Lab - Diagnostic Testing (echo, CT, nuclear med)  3rd Floor: - Vacant  4th Floor: - TCTS (cardiothoracic surgery) - AFib Clinic - Structural Heart Clinic - Vascular Surgery  - Vascular Ultrasound  5th Floor: - HeartCare Cardiology (general and EP) - Clinical Pharmacy for coumadin, hypertension, lipid, weight-loss medications, and med management appointments    Valet parking services will be available as well.

## 2023-05-14 ENCOUNTER — Encounter: Payer: Self-pay | Admitting: Cardiovascular Disease

## 2023-05-28 NOTE — Progress Notes (Signed)
 Spoke to patient and instructed them to come at 0630  and to be NPO after 0000.  Medications reviewed.    Confirmed that patient will have a ride home and someone to stay with them for 24 hours after the procedure.

## 2023-05-30 NOTE — Anesthesia Preprocedure Evaluation (Addendum)
 Anesthesia Evaluation  Patient identified by MRN, date of birth, ID band Patient awake    Reviewed: Allergy & Precautions, NPO status , Patient's Chart, lab work & pertinent test results, reviewed documented beta blocker date and time   Airway Mallampati: II  TM Distance: >3 FB     Dental no notable dental hx. 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 on medications + CAD, + Past MI, + Cardiac Stents and +CHF  Normal cardiovascular exam+ dysrhythmias Atrial Fibrillation + pacemaker + Valvular Problems/Murmurs MR  Rhythm:Regular Rate:Normal  Echo 03/02/23 1. Left ventricular ejection fraction, by estimation, is 60 to 65%. The  left ventricle has normal function. The left ventricle has no regional  wall motion abnormalities. There is mild left ventricular hypertrophy.  Left ventricular diastolic parameters  are indeterminate. The average left ventricular global longitudinal strain  is -10.2 %. The global longitudinal strain is abnormal.   2. Right ventricular systolic function is normal. The right ventricular  size is mildly enlarged. There is moderately elevated pulmonary artery  systolic pressure.   3. Left atrial size was mild to moderately dilated.   4. Right atrial size was severely dilated.   5. The mitral valve is normal in structure. Moderate to severe mitral  valve regurgitation. No evidence of mitral stenosis.   6. Tricuspid valve regurgitation is moderate to severe.   7. The aortic valve is normal in structure. Aortic valve regurgitation is  not visualized. Aortic valve sclerosis/calcification is present, without  any evidence of aortic stenosis.   8. The inferior vena cava is normal in size with greater than 50%  respiratory  variability, suggesting right atrial pressure of 3 mmHg.    EKG 05/10/23 Atrial fibrillation with frequent ventricular-paced complexes Right superior axis deviation Pulmonary disease pattern    Neuro/Psych CVA  negative psych ROS   GI/Hepatic Neg liver ROS,GERD  ,,  Endo/Other  diabetes, Well Controlled, Type 2, Insulin  DependentHypothyroidism  Hyperlipidemia  Renal/GU Renal InsufficiencyRenal disease  negative genitourinary   Musculoskeletal negative musculoskeletal ROS (+)    Abdominal   Peds  Hematology  (+) Blood dyscrasia, anemia   Anesthesia Other Findings   Reproductive/Obstetrics                              Anesthesia Physical Anesthesia Plan  ASA: 3  Anesthesia Plan: MAC   Post-op Pain Management: Minimal or no pain anticipated   Induction: Intravenous  PONV Risk Score and Plan: 2 and Propofol  infusion and Treatment may vary due to age or medical condition  Airway Management Planned: Natural Airway and Nasal Cannula  Additional Equipment: None  Intra-op Plan:   Post-operative Plan:   Informed Consent: I have reviewed the patients History and Physical, chart, labs and discussed the procedure including the risks, benefits and alternatives for the proposed anesthesia with the patient or authorized representative who has indicated his/her understanding and acceptance.     Dental advisory given  Plan Discussed with: CRNA and Anesthesiologist  Anesthesia Plan Comments:          Anesthesia Quick Evaluation

## 2023-05-31 ENCOUNTER — Ambulatory Visit (HOSPITAL_COMMUNITY): Payer: Self-pay | Admitting: Anesthesiology

## 2023-05-31 ENCOUNTER — Other Ambulatory Visit: Payer: Self-pay | Admitting: Physician Assistant

## 2023-05-31 ENCOUNTER — Ambulatory Visit (HOSPITAL_BASED_OUTPATIENT_CLINIC_OR_DEPARTMENT_OTHER): Payer: Self-pay | Admitting: Anesthesiology

## 2023-05-31 ENCOUNTER — Ambulatory Visit (HOSPITAL_COMMUNITY)
Admission: RE | Admit: 2023-05-31 | Discharge: 2023-05-31 | Disposition: A | Discharge status: Home / Self Care | Attending: Cardiovascular Disease | Admitting: Cardiovascular Disease

## 2023-05-31 ENCOUNTER — Encounter (HOSPITAL_COMMUNITY): Payer: Self-pay | Admitting: Cardiovascular Disease

## 2023-05-31 ENCOUNTER — Encounter (HOSPITAL_COMMUNITY)
Admission: RE | Disposition: A | Discharge status: Home / Self Care | Payer: Self-pay | Source: Home / Self Care | Attending: Cardiovascular Disease

## 2023-05-31 ENCOUNTER — Ambulatory Visit (HOSPITAL_COMMUNITY)
Admission: RE | Admit: 2023-05-31 | Discharge: 2023-05-31 | Disposition: A | Discharge status: Home / Self Care | Source: Ambulatory Visit | Attending: Cardiovascular Disease | Admitting: Cardiovascular Disease

## 2023-05-31 ENCOUNTER — Other Ambulatory Visit: Payer: Self-pay

## 2023-05-31 DIAGNOSIS — I5022 Chronic systolic (congestive) heart failure: Secondary | ICD-10-CM

## 2023-05-31 DIAGNOSIS — I4821 Permanent atrial fibrillation: Secondary | ICD-10-CM | POA: Diagnosis not present

## 2023-05-31 DIAGNOSIS — Z794 Long term (current) use of insulin: Secondary | ICD-10-CM | POA: Insufficient documentation

## 2023-05-31 DIAGNOSIS — I252 Old myocardial infarction: Secondary | ICD-10-CM | POA: Insufficient documentation

## 2023-05-31 DIAGNOSIS — I081 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: Secondary | ICD-10-CM | POA: Insufficient documentation

## 2023-05-31 DIAGNOSIS — I340 Nonrheumatic mitral (valve) insufficiency: Secondary | ICD-10-CM

## 2023-05-31 DIAGNOSIS — I5042 Chronic combined systolic (congestive) and diastolic (congestive) heart failure: Secondary | ICD-10-CM | POA: Diagnosis not present

## 2023-05-31 DIAGNOSIS — Z87891 Personal history of nicotine dependence: Secondary | ICD-10-CM | POA: Insufficient documentation

## 2023-05-31 DIAGNOSIS — Z955 Presence of coronary angioplasty implant and graft: Secondary | ICD-10-CM | POA: Diagnosis not present

## 2023-05-31 DIAGNOSIS — I2510 Atherosclerotic heart disease of native coronary artery without angina pectoris: Secondary | ICD-10-CM | POA: Diagnosis not present

## 2023-05-31 DIAGNOSIS — N184 Chronic kidney disease, stage 4 (severe): Secondary | ICD-10-CM | POA: Diagnosis not present

## 2023-05-31 DIAGNOSIS — E785 Hyperlipidemia, unspecified: Secondary | ICD-10-CM | POA: Diagnosis not present

## 2023-05-31 DIAGNOSIS — Z8673 Personal history of transient ischemic attack (TIA), and cerebral infarction without residual deficits: Secondary | ICD-10-CM | POA: Insufficient documentation

## 2023-05-31 DIAGNOSIS — I700 Atherosclerosis of aorta: Secondary | ICD-10-CM | POA: Insufficient documentation

## 2023-05-31 DIAGNOSIS — G4730 Sleep apnea, unspecified: Secondary | ICD-10-CM | POA: Diagnosis not present

## 2023-05-31 DIAGNOSIS — I110 Hypertensive heart disease with heart failure: Secondary | ICD-10-CM

## 2023-05-31 DIAGNOSIS — I5043 Acute on chronic combined systolic (congestive) and diastolic (congestive) heart failure: Secondary | ICD-10-CM | POA: Diagnosis not present

## 2023-05-31 DIAGNOSIS — E1122 Type 2 diabetes mellitus with diabetic chronic kidney disease: Secondary | ICD-10-CM | POA: Insufficient documentation

## 2023-05-31 DIAGNOSIS — I130 Hypertensive heart and chronic kidney disease with heart failure and stage 1 through stage 4 chronic kidney disease, or unspecified chronic kidney disease: Secondary | ICD-10-CM | POA: Diagnosis not present

## 2023-05-31 HISTORY — PX: TRANSESOPHAGEAL ECHOCARDIOGRAM (CATH LAB): EP1270

## 2023-05-31 LAB — POCT I-STAT, CHEM 8
BUN: 78 mg/dL — ABNORMAL HIGH (ref 8–23)
Calcium, Ion: 1.17 mmol/L (ref 1.15–1.40)
Chloride: 103 mmol/L (ref 98–111)
Creatinine, Ser: 3.8 mg/dL — ABNORMAL HIGH (ref 0.44–1.00)
Glucose, Bld: 186 mg/dL — ABNORMAL HIGH (ref 70–99)
HCT: 37 % (ref 36.0–46.0)
Hemoglobin: 12.6 g/dL (ref 12.0–15.0)
Potassium: 4.2 mmol/L (ref 3.5–5.1)
Sodium: 139 mmol/L (ref 135–145)
TCO2: 24 mmol/L (ref 22–32)

## 2023-05-31 LAB — ECHO TEE
AR max vel: 1.29 cm2
AV Area VTI: 1.24 cm2
AV Area mean vel: 1.21 cm2
AV Mean grad: 6.4 mmHg
AV Peak grad: 11.8 mmHg
Ao pk vel: 1.72 m/s
MV M vel: 5.04 m/s
MV Peak grad: 101.6 mmHg
Radius: 0.71 cm

## 2023-05-31 SURGERY — TRANSESOPHAGEAL ECHOCARDIOGRAM (TEE) (CATHLAB)
Anesthesia: Monitor Anesthesia Care

## 2023-05-31 MED ORDER — PROPOFOL 10 MG/ML IV BOLUS
INTRAVENOUS | Status: DC | PRN
  Administered 2023-05-31: 20 mg via INTRAVENOUS
  Administered 2023-05-31: 10 mg via INTRAVENOUS
  Administered 2023-05-31 (×3): 20 mg via INTRAVENOUS
  Administered 2023-05-31: 10 mg via INTRAVENOUS
  Administered 2023-05-31: 40 mg via INTRAVENOUS
  Administered 2023-05-31 (×2): 20 mg via INTRAVENOUS

## 2023-05-31 MED ORDER — LIDOCAINE 2% (20 MG/ML) 5 ML SYRINGE
INTRAMUSCULAR | Status: DC | PRN
  Administered 2023-05-31: 60 mg via INTRAVENOUS

## 2023-05-31 MED ORDER — SODIUM CHLORIDE 0.9 % IV SOLN
INTRAVENOUS | Status: DC
Start: 2023-05-31 — End: 2023-05-31

## 2023-05-31 NOTE — Interval H&P Note (Signed)
 History and Physical Interval Note:  05/31/2023 7:48 AM  Amanda [name]  has presented today for surgery, with the diagnosis of MITRAL VALVE REGURGITATION.  The various methods of treatment have been discussed with the patient and family. After consideration of risks, benefits and other options for treatment, the patient has consented to  Procedure(s): TRANSESOPHAGEAL ECHOCARDIOGRAM (N/A) as a surgical intervention.  The patient's history has been reviewed, patient examined, no change in status, stable for surgery.  I have reviewed the patient's chart and labs.  Questions were answered to the patient's satisfaction.     Kennetha Pearman

## 2023-05-31 NOTE — Progress Notes (Signed)
  Echocardiogram Echocardiogram Transesophageal has been performed.  Amanda [name] 05/31/2023, 8:29 AM

## 2023-05-31 NOTE — Transfer of Care (Signed)
 Immediate Anesthesia Transfer of Care Note  Patient: Amanda [name]  Procedure(s) Performed: TRANSESOPHAGEAL ECHOCARDIOGRAM  Patient Location: PACU  Anesthesia Type:MAC  Level of Consciousness: drowsy  Airway & Oxygen  Therapy: Patient Spontanous Breathing and Patient connected to nasal cannula oxygen   Post-op Assessment: Report given to RN and Post -op Vital signs reviewed and stable  Post vital signs: Reviewed and stable  Last Vitals:  Vitals Value Taken Time  BP 101/54   Temp    Pulse 58   Resp 17   SpO2 100     Last Pain: There were no vitals filed for this visit.       Complications: No notable events documented.

## 2023-05-31 NOTE — Anesthesia Postprocedure Evaluation (Signed)
 Anesthesia Post Note  Patient: Amanda [name]  Procedure(s) Performed: TRANSESOPHAGEAL ECHOCARDIOGRAM     Patient location during evaluation: PACU Anesthesia Type: MAC Level of consciousness: awake and alert and oriented Pain management: pain level controlled Vital Signs Assessment: post-procedure vital signs reviewed and stable Respiratory status: spontaneous breathing, nonlabored ventilation and respiratory function stable Cardiovascular status: stable and blood pressure returned to baseline Postop Assessment: no apparent nausea or vomiting Anesthetic complications: no   No notable events documented.  Last Vitals:  Vitals:   05/31/23 0840 05/31/23 0850  BP: 122/71 128/69  Pulse: 74 78  Resp: 17 13  Temp:    SpO2: 99% 99%    Last Pain:  Vitals:   05/31/23 0816  TempSrc: Temporal                 Nance Mccombs A.

## 2023-05-31 NOTE — Op Note (Signed)
 INDICATIONS: Mitral insufficiency  PROCEDURE:   Informed consent was obtained prior to the procedure. The risks, benefits and alternatives for the procedure were discussed and the patient comprehended these risks.  Risks include, but are not limited to, cough, sore throat, vomiting, nausea, somnolence, esophageal and stomach trauma or perforation, bleeding, low blood pressure, aspiration, pneumonia, infection, trauma to the teeth and death.    After a procedural time-out, the oropharynx was anesthetized with 20% benzocaine  spray.   During this procedure the patient was administered IV propofol  by Anesthesiology, Dr. Yvonnie Heritage.  The transesophageal probe was inserted in the esophagus and stomach without difficulty and multiple views were obtained.  The patient was kept under observation until the patient left the procedure room.  The patient left the procedure room in stable condition.   Agitated microbubble saline contrast was not administered.  COMPLICATIONS:    There were no immediate complications.  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 Normal LV function. Moderate to severe TR. Well seated Watchman in the left atrial appendage, no leak.  RECOMMENDATIONS:     Discuss options for TEER in structural heart meeting.  Time Spent Directly with the Patient:  45 minutes   Amanda [name] 05/31/2023, 8:13 AM

## 2023-05-31 NOTE — Discharge Instructions (Signed)

## 2023-06-02 ENCOUNTER — Telehealth: Payer: Self-pay

## 2023-06-02 NOTE — Telephone Encounter (Signed)
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

## 2023-06-02 NOTE — Addendum Note (Signed)
 Addended by: Edra Govern D on: 06/02/2023 10:16 AM   Modules accepted: Orders

## 2023-06-02 NOTE — Progress Notes (Signed)
 Remote pacemaker transmission.

## 2023-06-03 ENCOUNTER — Other Ambulatory Visit: Payer: Self-pay | Admitting: Cardiology

## 2023-06-03 NOTE — Telephone Encounter (Signed)
 Rx refill sent to pharmacy.

## 2023-06-08 ENCOUNTER — Telehealth: Payer: Self-pay

## 2023-06-08 NOTE — Telephone Encounter (Signed)
 Amanda [name]:   Fossa looks good for transseptal approach. TR and pacing lead noted. Enough space within the LA to achieve straddle and maneuver down to the valve. Patient has significant annular and chordal calcification. MR appears on the lateral side of A2P2 in the bicomm and 3D views, possibly extending into A1P1. Posterior leaflet lengths in the long axis grasping windows measure ~0.6cm. We will want to confirm posterior leaflet length and extend of the calcium within the grasping zone day of the procedure using a bicomm x-plane sweep. Gradient and valve area were not seen in the upload, assuming that the patient's valve can sustain at least 1 implant. Plan on placing first clip directly over the jet on the lateral side of A2P2. NT/NTW appropriate.

## 2023-06-08 NOTE — Telephone Encounter (Signed)
 Reviewed the patient's case in Valve Team meeting today.  There is concern that entanglement in the calcified cord would occur and that the procedure would be difficult but doable. If proceeding, the recommendation is to use a PASCAL device.

## 2023-06-10 ENCOUNTER — Encounter: Payer: Self-pay | Admitting: Cardiology

## 2023-06-10 ENCOUNTER — Other Ambulatory Visit: Payer: Self-pay

## 2023-06-10 MED ORDER — REPATHA SURECLICK 140 MG/ML ~~LOC~~ SOAJ: 140.0000 mg | SUBCUTANEOUS | 3 refills | Status: AC

## 2023-06-10 MED ORDER — REPATHA SURECLICK 140 MG/ML ~~LOC~~ SOAJ: 1.0000 mL | SUBCUTANEOUS | 3 refills | Status: DC

## 2023-06-15 ENCOUNTER — Telehealth: Payer: Self-pay | Admitting: Pharmacy Technician

## 2023-06-15 ENCOUNTER — Other Ambulatory Visit (HOSPITAL_COMMUNITY): Payer: Self-pay

## 2023-06-15 ENCOUNTER — Telehealth: Payer: Self-pay

## 2023-06-15 NOTE — Telephone Encounter (Signed)
 Noted.

## 2023-06-15 NOTE — Telephone Encounter (Addendum)
 Patient notified of results and she want try preferred pharmacy but will let us  know if she wishes to continue at our pharmacy.

## 2023-06-15 NOTE — Telephone Encounter (Signed)
 Pharmacy Patient Advocate Encounter  Received notification from [facility] that Prior Authorization for repatha  has been APPROVED from 06/15/23 to 06/14/24. Ran test claim, Copay is $135.00- 3 month. This test claim was processed through [facility]- copay amounts may vary at other pharmacies due to pharmacy/plan contracts, or as the patient moves through the different stages of their insurance plan.   PA #/Case ID/Reference #: [id number]

## 2023-06-15 NOTE — Telephone Encounter (Signed)
 Reviewed TEE images.  The patient has several anatomic features that place her at higher risk of problems with transcatheter edge-to-edge repair of the mitral valve.  These include pacemaker leads, dense subcortical calcification, and a short posterior leaflet.  I think that considering her significant comorbidities, it is probably best to continue to manage her medically.  I called her to discuss this and left a message on her voicemail to call back.  I will try her again tomorrow if I do not hear back from her before that.

## 2023-06-15 NOTE — Telephone Encounter (Signed)
 Pharmacy Patient Advocate Encounter   Received notification from Pt Calls Messages that prior authorization for Repatha  is required/requested.   Insurance verification completed.   The patient is insured through [employer].   Per test claim: PA required; PA submitted to above mentioned insurance via CoverMyMeds Key/confirmation #/EOC [id number] Status is pending

## 2023-06-16 NOTE — Telephone Encounter (Signed)
 I was able to get a hold of the patient today and communicated my recommendations with her.  She understands and will continue to follow with Dr. Krasowski for medical therapy of her heart failure and mitral regurgitation.

## 2023-07-02 DIAGNOSIS — N184 Chronic kidney disease, stage 4 (severe): Secondary | ICD-10-CM | POA: Diagnosis not present

## 2023-07-02 DIAGNOSIS — D6869 Other thrombophilia: Secondary | ICD-10-CM | POA: Diagnosis not present

## 2023-07-02 DIAGNOSIS — N185 Chronic kidney disease, stage 5: Secondary | ICD-10-CM | POA: Diagnosis not present

## 2023-07-02 DIAGNOSIS — E1122 Type 2 diabetes mellitus with diabetic chronic kidney disease: Secondary | ICD-10-CM | POA: Diagnosis not present

## 2023-07-09 DIAGNOSIS — N184 Chronic kidney disease, stage 4 (severe): Secondary | ICD-10-CM | POA: Diagnosis not present

## 2023-07-09 DIAGNOSIS — I129 Hypertensive chronic kidney disease with stage 1 through stage 4 chronic kidney disease, or unspecified chronic kidney disease: Secondary | ICD-10-CM | POA: Diagnosis not present

## 2023-07-09 DIAGNOSIS — M898X9 Other specified disorders of bone, unspecified site: Secondary | ICD-10-CM | POA: Diagnosis not present

## 2023-07-09 DIAGNOSIS — E1122 Type 2 diabetes mellitus with diabetic chronic kidney disease: Secondary | ICD-10-CM | POA: Diagnosis not present

## 2023-07-16 DIAGNOSIS — H353132 Nonexudative age-related macular degeneration, bilateral, intermediate dry stage: Secondary | ICD-10-CM | POA: Diagnosis not present

## 2023-07-16 DIAGNOSIS — H43813 Vitreous degeneration, bilateral: Secondary | ICD-10-CM | POA: Diagnosis not present

## 2023-07-16 DIAGNOSIS — H353231 Exudative age-related macular degeneration, bilateral, with active choroidal neovascularization: Secondary | ICD-10-CM | POA: Diagnosis not present

## 2023-07-16 DIAGNOSIS — E113213 Type 2 diabetes mellitus with mild nonproliferative diabetic retinopathy with macular edema, bilateral: Secondary | ICD-10-CM | POA: Diagnosis not present

## 2023-07-16 DIAGNOSIS — H26493 Other secondary cataract, bilateral: Secondary | ICD-10-CM | POA: Diagnosis not present

## 2023-07-19 ENCOUNTER — Ambulatory Visit (INDEPENDENT_AMBULATORY_CARE_PROVIDER_SITE_OTHER): Payer: Medicare Other

## 2023-07-19 DIAGNOSIS — I480 Paroxysmal atrial fibrillation: Secondary | ICD-10-CM

## 2023-07-19 DIAGNOSIS — I5022 Chronic systolic (congestive) heart failure: Secondary | ICD-10-CM | POA: Diagnosis not present

## 2023-07-19 LAB — CUP PACEART REMOTE DEVICE CHECK
Battery Remaining Longevity: 126 mo
Battery Voltage: 3 V
Brady Statistic RA Percent Paced: 0.13 %
Brady Statistic RV Percent Paced: 50.67 %
Date Time Interrogation Session: 20250615214740
Implantable Lead Connection Status: 753985
Implantable Lead Connection Status: 753985
Implantable Lead Implant Date: 20210322
Implantable Lead Implant Date: 20210322
Implantable Lead Location: 753859
Implantable Lead Location: 753860
Implantable Lead Model: 5076
Implantable Lead Model: 5076
Implantable Pulse Generator Implant Date: 20210322
Lead Channel Impedance Value: 266 Ohm
Lead Channel Impedance Value: 304 Ohm
Lead Channel Impedance Value: 361 Ohm
Lead Channel Impedance Value: 437 Ohm
Lead Channel Pacing Threshold Amplitude: 0.625 V
Lead Channel Pacing Threshold Amplitude: 0.75 V
Lead Channel Pacing Threshold Pulse Width: 0.4 ms
Lead Channel Pacing Threshold Pulse Width: 0.4 ms
Lead Channel Sensing Intrinsic Amplitude: 4.875 mV
Lead Channel Sensing Intrinsic Amplitude: 4.875 mV
Lead Channel Sensing Intrinsic Amplitude: 7.875 mV
Lead Channel Sensing Intrinsic Amplitude: 7.875 mV
Lead Channel Setting Pacing Amplitude: 1.5 V
Lead Channel Setting Pacing Amplitude: 2.5 V
Lead Channel Setting Pacing Pulse Width: 0.4 ms
Lead Channel Setting Sensing Sensitivity: 2 mV
Zone Setting Status: 755011
Zone Setting Status: 755011

## 2023-07-22 ENCOUNTER — Ambulatory Visit: Payer: Self-pay | Admitting: Cardiology

## 2023-07-26 ENCOUNTER — Ambulatory Visit (HOSPITAL_BASED_OUTPATIENT_CLINIC_OR_DEPARTMENT_OTHER)
Admission: EM | Admit: 2023-07-26 | Discharge: 2023-07-26 | Disposition: A | Discharge status: Home / Self Care | Attending: Family Medicine | Admitting: Family Medicine

## 2023-07-26 ENCOUNTER — Encounter (HOSPITAL_BASED_OUTPATIENT_CLINIC_OR_DEPARTMENT_OTHER): Payer: Self-pay | Admitting: Emergency Medicine

## 2023-07-26 DIAGNOSIS — N390 Urinary tract infection, site not specified: Secondary | ICD-10-CM | POA: Insufficient documentation

## 2023-07-26 DIAGNOSIS — R350 Frequency of micturition: Secondary | ICD-10-CM | POA: Diagnosis not present

## 2023-07-26 DIAGNOSIS — Z87440 Personal history of urinary (tract) infections: Secondary | ICD-10-CM | POA: Insufficient documentation

## 2023-07-26 DIAGNOSIS — R197 Diarrhea, unspecified: Secondary | ICD-10-CM | POA: Diagnosis not present

## 2023-07-26 LAB — POCT URINALYSIS DIP (MANUAL ENTRY)
Bilirubin, UA: NEGATIVE
Glucose, UA: NEGATIVE mg/dL
Ketones, POC UA: NEGATIVE mg/dL
Nitrite, UA: NEGATIVE
Protein Ur, POC: 100 mg/dL — AB
Spec Grav, UA: 1.02 (ref 1.010–1.025)
Urobilinogen, UA: 0.2 U/dL
pH, UA: 5.5 (ref 5.0–8.0)

## 2023-07-26 MED ORDER — NITROFURANTOIN MONOHYD MACRO 100 MG PO CAPS: 100.0000 mg | ORAL_CAPSULE | Freq: Two times a day (BID) | ORAL | 0 refills | Status: DC

## 2023-07-26 NOTE — ED Triage Notes (Signed)
 Pt c/o frequency in urination 2-3 days ago.

## 2023-07-26 NOTE — Discharge Instructions (Addendum)
 Urinalysis is abnormal.  Will treat for UTI.  Urine culture sent.  Will adjust the plan of care, if needed once the culture results.  Nitrofurantoin 100 mg twice daily for 7 days.  Get plenty of fluids and rest.  Follow-up if symptoms do not improve, worsen or new symptoms occur.

## 2023-07-26 NOTE — ED Provider Notes (Signed)
 [name]    CSN: [id number] Arrival date & time: 07/26/23  1300      History   Chief Complaint No chief complaint on file.   HPI Amanda [name] is a 83 y.o. female.   83 year old female with report of intermittent UTIs.  Reports UTIs are approximately every 6 months.  Her first sign is always urinary frequency and urgency.  She started with frequency and urgency on 07/24/2023.  She has some urethral discomfort but not actual burning at this time.  She denies lower abdominal pain.  She denies fever, nausea, vomiting, constipation, diarrhea.  She reports she had diarrhea for about 24 to 48 hours 2 days before symptoms started and she feels like that is how she got the UTI.     Past Medical History:  Diagnosis Date   (HFpEF) heart failure with preserved ejection fraction (HCC) 12/13/2022   Abnormal nuclear stress test 06/23/2016   Overview:   Added automatically from request for surgery [id number]     Acute ischemic stroke [facility])    Acute kidney failure, unspecified (HCC) 02/02/2022   Acute metabolic encephalopathy    Acute on chronic combined systolic and diastolic hrt fail (HCC) 12/16/2022   Acute on chronic systolic (congestive) heart failure (HCC)    Acute renal insufficiency 09/02/2016   Acute respiratory failure with hypoxia (HCC) 12/16/2022   Age-related osteoporosis without current pathological fracture 02/02/2022   AKI (acute kidney injury) (HCC) 09/01/2016   Anemia due to stage 4 chronic kidney disease (HCC) 04/04/2015   Anemia in chronic kidney disease 02/02/2022   Atherosclerosis of aorta (HCC) 02/02/2022   Athscl heart disease of native coronary artery w/o ang pctrs 02/02/2022   Atrial fibrillation (HCC)    Atrial fibrillation with RVR (HCC) 12/06/2022   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) 04/04/2015   Bradycardia 09/17/2014   Chest pain 06/23/2016   Overview:  Added automatically from request for surgery [id number]    Chronic anemia    Chronic kidney disease (CKD), stage III (moderate) (HCC)    Chronic kidney disease, stage 4 (severe) (HCC) 02/02/2022   Chronic systolic CHF (congestive heart failure) (HCC)    Closed left hip fracture (HCC) 03/23/2021   Constipation 03/28/2021   Constipation, unspecified 02/02/2022   Coronary artery disease 10/12/2016   Non drug-eluting stent implanted in June 2018 to mid RCA   08/08/2016 10:37  Angiographic Findings  Cardiac Arteries and Lesion Findings LMCA: 0% and Normal. LAD: 0% and Normal. RCA:   Lesion on Mid RCA: Mid subsection.95% stenosis reduced to 0%. Pre procedure   TIMI II flow was noted. Post Procedure TIMI III flow was present. Poor run   off was present. The lesion was diagnosed as High Risk (C).    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) 04/04/2015   Diabetes mellitus without complication (HCC)    Diverticulosis    Dyslipidemia 09/17/2014   Gastro-esophageal reflux disease without esophagitis 02/02/2022   Gastroesophageal reflux    GI bleed 08/06/2016   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   History of cardiac monitoring 02/23/2018   monitor inserted   History of falling 02/02/2022   History of iron deficiency anemia 12/28/2017   History of transesophageal echocardiography (TEE)    Hypercalcemia 04/04/2015   Hyperlipidemia    Hyperlipidemia, unspecified 02/02/2022   Hypertensive disorder 07/21/2017   Hypertensive heart disease with heart failure (HCC)    Hypothyroidism, unspecified 02/02/2022  Iron deficiency anemia 12/28/2017   Iron deficiency anemia, unspecified 02/02/2022   Ischemic cardiomyopathy 02/02/2022   Kidney disease 07/21/2017   Long term (current) use of insulin  (HCC) 02/02/2022   LV dysfunction 09/02/2016   Mitral regurgitation severe based on TEE from 2022 April 06/10/2020   Myocardial infarction [facility])    NSTEMI (non-ST elevated myocardial infarction) (HCC) 08/06/2016   Obstructive sleep apnea  09/17/2014   Obstructive sleep apnea (adult) (pediatric) 02/02/2022   Old myocardial infarction 02/02/2022   Orthostatic hypotension 12/28/2017   OSA on CPAP    Other specified heart block 02/02/2022   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   Paroxysmal atrial flutter (HCC) 07/22/2022   Personal history of other diseases of the digestive system 02/02/2022   Postural dizziness with presyncope 09/23/2017   Presence of cardiac pacemaker 02/02/2022   Presence of other cardiac implants and grafts 02/02/2022   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/24/2017   Prsnl hx of TIA (TIA), and cereb infrc w/o resid deficits 02/02/2022   Recurrent left pleural effusion 03/14/2015   Renal failure, chronic, stage 3 (moderate) (HCC)    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/2022   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   Secondary hypercoagulable state (HCC) 04/23/2021   Syncope 11/04/2017   Systolic heart failure (HCC)    Thyroid  disease    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   Unspecified atrial fibrillation (HCC) 12/16/2022   Vascular disorder of intestine, unspecified (HCC) 02/02/2022   Vitamin D  deficiency 04/04/2015    Patient Active Problem List   Diagnosis Date Noted   Heme positive stool 02/15/2023   Abnormal CT scan, liver 02/15/2023   Acute on chronic combined systolic and diastolic hrt fail (HCC) 12/16/2022   Acute respiratory failure with hypoxia (HCC) 12/16/2022   Hypertensive heart disease with heart failure (HCC) 12/16/2022   Unspecified atrial fibrillation (HCC) 12/16/2022   (HFpEF) heart failure with preserved ejection fraction (HCC) 12/13/2022   Atrial fibrillation with RVR (HCC) 12/06/2022   Atrial flutter (HCC) 07/22/2022   Acute kidney failure, unspecified (HCC) 02/02/2022   Age-related osteoporosis without current pathological fracture 02/02/2022   Anemia in chronic kidney disease  02/02/2022   Atherosclerosis of aorta (HCC) 02/02/2022   Athscl heart disease of native coronary artery w/o ang pctrs 02/02/2022   Chronic kidney disease, stage 4 (severe) (HCC) 02/02/2022   Constipation, unspecified 02/02/2022   Gastro-esophageal reflux disease without esophagitis 02/02/2022   Gout, unspecified 02/02/2022   History of falling 02/02/2022   Hyperlipidemia, unspecified 02/02/2022   Hypothyroidism, unspecified 02/02/2022   Iron deficiency anemia, unspecified 02/02/2022   Ischemic cardiomyopathy 02/02/2022   Long term (current) use of insulin  (HCC) 02/02/2022   Obstructive sleep apnea (adult) (pediatric) 02/02/2022   Vascular disorder of intestine, unspecified (HCC) 02/02/2022   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/2022   Prsnl hx of TIA (TIA), and cereb infrc w/o resid deficits 02/02/2022   Old myocardial infarction 02/02/2022   Other specified heart block 02/02/2022   Personal history of other diseases of the digestive system 02/02/2022   Presence of cardiac pacemaker 02/02/2022   Presence of other cardiac implants and grafts 02/02/2022   Secondary hypercoagulable state (HCC) 04/23/2021   Constipation 03/28/2021   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   Closed left hip fracture (HCC) 03/23/2021   Mitral regurgitation severe  based on TEE from 2022 April 06/10/2020   Thyroid  disease    Chronic systolic CHF (congestive heart failure) (HCC)    OSA on CPAP    Gastroesophageal reflux    Diverticulosis    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   History of cardiac monitoring 02/23/2018   History of iron deficiency anemia 12/28/2017   Kidney disease 07/21/2017   Hypertensive disorder 07/21/2017   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/24/2017   Coronary artery disease 10/12/2016   AKI (acute  kidney injury) (HCC) 09/01/2016   Abnormal nuclear stress test 06/23/2016   Anemia due to stage 4 chronic kidney disease (HCC) 04/04/2015   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) 04/04/2015   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) 04/04/2015   Vitamin D  deficiency 04/04/2015   Hypercalcemia 04/04/2015   Atrial fibrillation (HCC) 09/17/2014   Dyslipidemia 09/17/2014    Past Surgical History:  Procedure Laterality Date   BIOPSY  02/15/2023   Procedure: BIOPSY;  Surgeon: Charlanne Groom, MD;  Location: [facility];  Service: Gastroenterology;;   VASSIE STUDY  05/16/2020   Procedure: BUBBLE STUDY;  Surgeon: Loni Soyla LABOR, MD;  Location: [facility] ENDOSCOPY;  Service: Cardiovascular;;   CARDIAC CATHETERIZATION     CARDIOVERSION N/A 05/16/2020   Procedure: CARDIOVERSION;  Surgeon: Loni Soyla LABOR, MD;  Location: [facility] ENDOSCOPY;  Service: Cardiovascular;  Laterality: N/A;   CARDIOVERSION N/A 09/06/2020   Procedure: CARDIOVERSION;  Surgeon: Kate Lonni CROME, MD;  Location: [facility] ENDOSCOPY;  Service: Cardiovascular;  Laterality: N/A;   CARDIOVERSION N/A 05/16/2021   Procedure: CARDIOVERSION;  Surgeon: Shlomo Wilbert SAUNDERS, MD;  Location: [facility] ENDOSCOPY;  Service: Cardiovascular;  Laterality: N/A;   CARDIOVERSION N/A 08/12/2022   Procedure: CARDIOVERSION;  Surgeon: Sheena Pugh, DO;  Location: [facility] INVASIVE CV LAB;  Service: Cardiovascular;  Laterality: N/A;   CARDIOVERSION N/A 09/30/2022   Procedure: CARDIOVERSION;  Surgeon: Lonni Slain, MD;  Location: [facility] INVASIVE CV LAB;  Service: Cardiovascular;  Laterality: N/A;   CARDIOVERSION N/A 12/08/2022   Procedure: CARDIOVERSION (CATH LAB);  Surgeon: Santo Stanly LABOR, MD;  Location: [facility] INVASIVE CV LAB;  Service: Cardiovascular;  Laterality: N/A;   CARDIOVERSION N/A 12/14/2022   Procedure: CARDIOVERSION (CATH LAB);  Surgeon: Lonni Slain, MD;  Location: [facility] INVASIVE CV LAB;   Service: Cardiovascular;  Laterality: N/A;   COLONOSCOPY  11/21/2014   Moderate predominantly sigmoid diverticulosis. Otherwise noraml collonscopy to TI.    CORONARY ANGIOPLASTY WITH STENT PLACEMENT  08/2016   ESOPHAGOGASTRODUODENOSCOPY (EGD) WITH PROPOFOL  N/A 02/15/2023   Procedure: ESOPHAGOGASTRODUODENOSCOPY (EGD) WITH PROPOFOL ;  Surgeon: Charlanne Groom, MD;  Location: [facility] ENDOSCOPY;  Service: Gastroenterology;  Laterality: N/A;   EXCISIONAL HEMORRHOIDECTOMY     LEFT ATRIAL APPENDAGE OCCLUSION  11/2016   in [place]   LOOP RECORDER INSERTION N/A 02/23/2018   Procedure: LOOP RECORDER INSERTION;  Surgeon: Inocencio Soyla Lunger, MD;  Location: [facility] INVASIVE CV LAB;  Service: Cardiovascular;  Laterality: N/A;   LOOP RECORDER REMOVAL N/A 04/24/2019   Procedure: LOOP RECORDER REMOVAL;  Surgeon: Inocencio Soyla Lunger, MD;  Location: [facility] INVASIVE CV LAB;  Service: Cardiovascular;  Laterality: N/A;   MALONEY DILATION N/A 02/15/2023   Procedure: AGAPITO DILATION;  Surgeon: Charlanne Groom, MD;  Location: [facility] ENDOSCOPY;  Service: Gastroenterology;  Laterality: N/A;   NOSE SURGERY     PACEMAKER IMPLANT N/A 04/24/2019   Procedure: PACEMAKER IMPLANT;  Surgeon: Inocencio Soyla Lunger, MD;  Location: [facility] INVASIVE CV LAB;  Service: Cardiovascular;  Laterality: N/A;   TEE WITH CARDIOVERSION     TEE WITHOUT CARDIOVERSION N/A 05/16/2020   Procedure: TRANSESOPHAGEAL ECHOCARDIOGRAM (TEE);  Surgeon: Loni Soyla LABOR, MD;  Location: [facility] ENDOSCOPY;  Service: Cardiovascular;  Laterality: N/A;   TEE WITHOUT CARDIOVERSION N/A 08/12/2022   Procedure: TRANSESOPHAGEAL ECHOCARDIOGRAM;  Surgeon: Sheena Pugh, DO;  Location: [facility]VASIVE CV LAB;  Service: Cardiovascular;  Laterality: N/A;   TEE WITHOUT CARDIOVERSION N/A 09/30/2022   Procedure: TRANSESOPHAGEAL ECHOCARDIOGRAM;  Surgeon: Lonni Slain, MD;  Location: [facility] INVASIVE CV LAB;  Service: Cardiovascular;  Laterality: N/A;   TOTAL HIP REVISION Left 03/25/2021   Procedure:  LEFT POSTERIOR TOTAL HIP  ZIMMER CABLES;  Surgeon: Ernie Cough, MD;  Location: [facility] ORS;  Service: Orthopedics;  Laterality: Left;   TRANSESOPHAGEAL ECHOCARDIOGRAM (CATH LAB) N/A 05/31/2023   Procedure: TRANSESOPHAGEAL ECHOCARDIOGRAM;  Surgeon: Francyne Headland, MD;  Location: [facility] INVASIVE CV LAB;  Service: Cardiovascular;  Laterality: N/A;    OB History   No obstetric history on file.      Home Medications    Prior to Admission medications   Medication Sig Start Date End Date Taking? Authorizing Provider  amiodarone  (PACERONE ) 200 MG tablet Take 0.5 tablets (100 mg total) by mouth daily. Patient taking differently: Take 200 mg by mouth daily. 05/10/23  Yes Krasowski, Robert J, MD  amLODipine  (NORVASC ) 10 MG tablet Take 10 mg by mouth daily. 12/25/22  Yes [provider]  Biotin 1000 MCG tablet Take 1,000 mcg by mouth daily.   Yes [provider]  blood glucose meter kit and supplies KIT Dispense based on patient and insurance preference. Use up to four times daily as directed. Patient taking differently: Inject 1 each into the skin See admin instructions. Dispense based on patient and insurance preference. Use up to four times daily as directed. 03/31/21  Yes Swayze, Ava, DO  carboxymethylcellul-glycerin (REFRESH RELIEVA) 0.5-0.9 % ophthalmic solution Place 1 drop into both eyes 4 (four) times daily as needed for dry eyes. VE   Yes [provider]  Cholecalciferol  (VITAMIN D3) 50 MCG (2000 UT) TABS Take 2,000 Units by mouth daily at 12 noon.   Yes [provider]  cloNIDine  (CATAPRES ) 0.1 MG tablet Take 0.1 mg by mouth 2 (two) times daily. 12/25/22  Yes [provider]  Coenzyme Q10 100 MG capsule Take 100 mg by mouth 2 (two) times daily.   Yes [provider]  colchicine 0.6 MG tablet Take 0.6 mg by mouth daily as needed (Gout).   Yes [provider]  denosumab  (PROLIA ) 60 MG/ML SOSY injection Inject 60 mg into the skin every 6  (six) months.   Yes [provider]  docusate sodium  (COLACE) 100 MG capsule Take 100 mg by mouth in the morning.   Yes [provider]  estradiol  (ESTRACE ) 0.1 MG/GM vaginal cream Place 1 Applicatorful vaginally every Monday, Wednesday, and Friday.    Yes [provider]  Evolocumab  (REPATHA  SURECLICK) 140 MG/ML SOAJ Inject 140 mg into the skin every 14 (fourteen) days. 06/10/23  Yes Krasowski, Robert J, MD  famotidine  (PEPCID ) 40 MG tablet Take 40 mg by mouth in the morning. 03/10/23  Yes [provider]  febuxostat  (ULORIC ) 40 MG tablet Take 40 mg by mouth every evening. 05/06/23  Yes [provider]  ferrous sulfate  325 (65 FE) MG EC tablet Take 325 mg by mouth 2 (two) times daily with breakfast and lunch. 12/28/17  Yes [provider]  folic acid  (FOLVITE ) 800 MCG tablet Take 800 mcg by mouth daily at 12 noon.   Yes [provider]  Glucosamine-Chondroitin (OSTEO BI-FLEX REGULAR STRENGTH PO) Take 25 mcg by mouth daily at 12 noon. Plus D3   Yes [provider]  HUMALOG KWIKPEN 100 UNIT/ML KwikPen Inject 2-6 Units into the skin See admin instructions. Per sliding scale 71-150=0 151-200=2 201-250=4 251-300=6 301-350=8 351-400=10 Check level in 2 hours and if no results call MD 12/25/22  Yes [provider]  hydrALAZINE  (APRESOLINE ) 50 MG tablet Take 1 tablet (50 mg total) by mouth every 6 (six) hours. 12/08/22  Yes Krasowski, Robert J, MD  isosorbide  mononitrate (IMDUR ) 60 MG 24 hr tablet Take 60 mg by mouth daily.   Yes [provider]  levothyroxine  (SYNTHROID , LEVOTHROID) 50 MCG tablet Take 50 mcg by mouth daily before breakfast.   Yes [provider]  metoprolol  tartrate (LOPRESSOR ) 25 MG tablet Take 25 mg by mouth 2 (two) times daily. 01/22/23  Yes [provider]  Multiple Vitamins-Minerals (PRESERVISION AREDS 2) CAPS Take 1 capsule by mouth 2 (two) times daily.   Yes [provider]  nitrofurantoin, macrocrystal-monohydrate, (MACROBID) 100 MG capsule Take 1 capsule (100 mg total) by mouth 2 (two) times daily for 7 days. 07/26/23 08/02/23 Yes Ival Domino, FNP  nitroGLYCERIN (NITROSTAT) 0.4 MG SL tablet Place 0.4 mg under the tongue every 5 (five) minutes as needed for chest pain.   Yes [provider]  Nutritional Supplements (GLUCERNA MEAL PO) Take 237 mLs by mouth daily.   Yes [provider]  omega-3 acid ethyl esters (LOVAZA ) 1 g capsule TAKE 2 CAPSULES BY MOUTH DAILY. 06/03/23  Yes Krasowski, Robert J, MD  pantoprazole  (PROTONIX ) 40 MG tablet Take 1 tablet (40 mg total) by mouth daily. 10/14/21  Yes Krasowski, Robert J, MD  Polyethylene Glycol 3350  (MIRALAX  PO) Take 1 Capful by mouth daily as needed (Constipation).   Yes [provider]  pravastatin  (PRAVACHOL ) 40 MG tablet Take 40 mg by mouth every other day.   Yes [provider]  ranolazine  (RANEXA ) 500 MG 12 hr tablet Take 1 tablet (500 mg total) by mouth 2 (two) times daily. 04/26/23  Yes Krasowski, Robert J, MD  torsemide  (DEMADEX ) 20 MG tablet Take 20 mg by mouth 2 (two) times daily. 12/25/22  Yes [provider]  vitamin B-12 (CYANOCOBALAMIN ) 1000 MCG tablet Take 1,000 mcg by mouth in the morning and at bedtime.   Yes [provider]  Wheat Dextrin (BENEFIBER PO) Take 3 tablets by mouth daily.   Yes [provider]  Lactobacillus (FLORAJEN ACIDOPHILUS) CAPS Take 1 capsule by mouth daily at 12 noon. 390 mg each    [provider]    Family History Family History  Problem Relation Age of Onset   Breast cancer Mother    Hypertension Father    Prostate cancer Father    Stroke Father    Diabetes Sister    Heart attack Sister    Heart attack Brother    Cancer Maternal Grandfather        not sure if it is colon or rectal    Heart attack Maternal Uncle    Rectal cancer Maternal Uncle    Stroke Maternal Uncle    Esophageal cancer  Neg Hx    Pancreatic cancer Neg Hx    Stomach cancer Neg Hx     Social History Social History   Tobacco Use  Smoking status: Former   Smokeless tobacco: Former    Quit date: 1997   Tobacco comments:    Former smoker 05/22/21  Vaping Use   Vaping status: Never Used  Substance Use Topics   Alcohol use: Yes    Alcohol/week: 2.0 standard drinks of alcohol    Types: 2 Standard drinks or equivalent per week    Comment: Occ   Drug use: No     Allergies   Ciprofloxacin, Contrast media [iodinated contrast media], Crestor [rosuvastatin], Lipitor [atorvastatin], Nsaids, Statins, and Sulfa antibiotics   Review of Systems Review of Systems  Constitutional:  Negative for fever.  Respiratory:  Negative for cough.   Cardiovascular:  Negative for chest pain.  Gastrointestinal:  Positive for abdominal pain. Negative for constipation, diarrhea, nausea and vomiting.  Genitourinary:  Positive for frequency and urgency.  Musculoskeletal:  Negative for arthralgias and back pain.  Skin:  Negative for color change and rash.  Neurological:  Negative for syncope.  All other systems reviewed and are negative.    Physical Exam Triage Vital Signs ED Triage Vitals  Encounter Vitals Group     BP 07/26/23 1419 123/71     Girls Systolic BP Percentile --      Girls Diastolic BP Percentile --      Boys Systolic BP Percentile --      Boys Diastolic BP Percentile --      Pulse Rate 07/26/23 1419 66     Resp 07/26/23 1419 18     Temp 07/26/23 1419 97.6 F (36.4 C)     Temp Source 07/26/23 1419 Oral     SpO2 07/26/23 1419 97 %     Weight --      Height --      Head Circumference --      Peak Flow --      Pain Score 07/26/23 1413 0     Pain Loc --      Pain Education --      Exclude from Growth Chart --    No data found.  Updated Vital Signs BP 123/71 (BP Location: Right Arm)   Pulse 66   Temp 97.6 F (36.4 C) (Oral)   Resp 18   SpO2 97%   Visual Acuity Right Eye Distance:    Left Eye Distance:   Bilateral Distance:    Right Eye Near:   Left Eye Near:    Bilateral Near:     Physical Exam Vitals and nursing note reviewed.  Constitutional:      General: She is not in acute distress.    Appearance: She is well-developed. She is not ill-appearing or toxic-appearing.  HENT:     Head: Normocephalic and atraumatic.     Right Ear: Hearing, tympanic membrane, ear canal and external ear normal.     Left Ear: Hearing, tympanic membrane, ear canal and external ear normal.     Nose: No congestion or rhinorrhea.     Right Sinus: No maxillary sinus tenderness or frontal sinus tenderness.     Left Sinus: No maxillary sinus tenderness or frontal sinus tenderness.     Mouth/Throat:     Lips: Pink.     Mouth: Mucous membranes are moist.     Pharynx: Uvula midline. No oropharyngeal exudate or posterior oropharyngeal erythema.     Tonsils: No tonsillar exudate.   Eyes:     Conjunctiva/sclera: Conjunctivae normal.     Pupils: Pupils are equal, round, and reactive to light.  Cardiovascular:     Rate and Rhythm: Normal rate and regular rhythm.     Heart sounds: S1 normal and S2 normal. No murmur heard. Pulmonary:     Effort: Pulmonary effort is normal. No respiratory distress.     Breath sounds: Normal breath sounds. No decreased breath sounds, wheezing, rhonchi or rales.  Abdominal:     General: Bowel sounds are normal.     Palpations: Abdomen is soft.     Tenderness: There is abdominal tenderness (Mild) in the suprapubic area.   Musculoskeletal:        General: No swelling.     Cervical back: Neck supple.  Lymphadenopathy:     Head:     Right side of head: No submental, submandibular, tonsillar, preauricular or posterior auricular adenopathy.     Left side of head: No submental, submandibular, tonsillar, preauricular or posterior auricular adenopathy.     Cervical: No cervical adenopathy.     Right cervical: No superficial cervical adenopathy.    Left  cervical: No superficial cervical adenopathy.   Skin:    General: Skin is warm and dry.     Capillary Refill: Capillary refill takes less than 2 seconds.     Findings: No rash.   Neurological:     Mental Status: She is alert and oriented to person, place, and time.   Psychiatric:        Mood and Affect: Mood normal.      UC Treatments / Results  Labs (all labs ordered are listed, but only abnormal results are displayed) Labs Reviewed  POCT URINALYSIS DIP (MANUAL ENTRY) - Abnormal; Notable for the following components:      Result Value   Clarity, UA cloudy (*)    Blood, UA trace-intact (*)    Protein Ur, POC =100 (*)    Leukocytes, UA Large (3+) (*)    All other components within normal limits  URINE CULTURE    EKG   Radiology No results found.  Procedures Procedures (including critical care time)  Medications Ordered in UC Medications - No data to display  Initial Impression / Assessment and Plan / UC Course  I have reviewed the triage vital signs and the nursing notes.  Pertinent labs & imaging results that were available during my care of the patient were reviewed by me and considered in my medical decision making (see chart for details).  Plan of Care: UTI: UA is abnormal.  Urine culture sent.  Will adjust the plan of care, if needed once the culture results.  Nitrofurantoin 100 mg twice daily for 7 days.  Get plenty of fluids and rest.  Follow-up if symptoms do not improve, worsen or new symptoms occur.  I reviewed the plan of care with the patient and/or the patient's guardian.  The patient and/or guardian had time to ask questions and acknowledged that the questions were answered.  I provided instruction on symptoms or reasons to return here or to go to an ER, if symptoms/condition did not improve, worsened or if new symptoms occurred.  Final Clinical Impressions(s) / UC Diagnoses   Final diagnoses:  Urinary tract infection without hematuria, site  unspecified  Urinary frequency     Discharge Instructions      Urinalysis is abnormal.  Will treat for UTI.  Urine culture sent.  Will adjust the plan of care, if needed once the culture results.  Nitrofurantoin 100 mg twice daily for 7 days.  Get plenty of fluids and rest.  Follow-up  if symptoms do not improve, worsen or new symptoms occur.     ED Prescriptions     Medication Sig Dispense Auth. Provider   nitrofurantoin, macrocrystal-monohydrate, (MACROBID) 100 MG capsule Take 1 capsule (100 mg total) by mouth 2 (two) times daily for 7 days. 14 capsule Ival Domino, FNP      PDMP not reviewed this encounter.   Ival Domino, FNP 07/26/23 1453

## 2023-07-28 ENCOUNTER — Ambulatory Visit (HOSPITAL_COMMUNITY): Payer: Self-pay

## 2023-07-28 LAB — URINE CULTURE: Culture: >=100000 — AB

## 2023-07-30 ENCOUNTER — Telehealth (HOSPITAL_BASED_OUTPATIENT_CLINIC_OR_DEPARTMENT_OTHER): Payer: Self-pay | Admitting: Family Medicine

## 2023-07-30 ENCOUNTER — Encounter (HOSPITAL_BASED_OUTPATIENT_CLINIC_OR_DEPARTMENT_OTHER): Payer: Self-pay | Admitting: Family Medicine

## 2023-07-30 MED ORDER — CEPHALEXIN 500 MG PO CAPS: 500.0000 mg | ORAL_CAPSULE | Freq: Three times a day (TID) | ORAL | 0 refills | Status: AC

## 2023-07-30 NOTE — Telephone Encounter (Signed)
 Patient was prescribed nitrofurantoin  for an acute UTI.  Her culture was positive and sensitive to nitrofurantoin .  Initially she was doing much better on the medication.  In the last day she has developed significant nausea and vomiting that she thinks is secondary to the nitrofurantoin .  Instructed to stop the nitrofurantoin  switch to cephalexin 500 mg 1 pill 3 times daily for 7 days.  Follow-up if symptoms do not completely resolve, if symptoms worsen or if new symptoms occur.  Patient updated on all of this by phone.

## 2023-08-02 NOTE — Telephone Encounter (Signed)
 Scheduled for 10/06 at 9:30am

## 2023-08-10 ENCOUNTER — Telehealth: Payer: Self-pay | Admitting: Cardiology

## 2023-08-10 ENCOUNTER — Encounter: Payer: Self-pay | Admitting: Cardiology

## 2023-08-10 ENCOUNTER — Ambulatory Visit: Attending: Cardiology | Admitting: Cardiology

## 2023-08-10 VITALS — BP 110/60 | HR 129 | Ht 63.0 in | Wt 161.0 lb

## 2023-08-10 DIAGNOSIS — I2510 Atherosclerotic heart disease of native coronary artery without angina pectoris: Secondary | ICD-10-CM | POA: Diagnosis not present

## 2023-08-10 DIAGNOSIS — I129 Hypertensive chronic kidney disease with stage 1 through stage 4 chronic kidney disease, or unspecified chronic kidney disease: Secondary | ICD-10-CM

## 2023-08-10 DIAGNOSIS — I340 Nonrheumatic mitral (valve) insufficiency: Secondary | ICD-10-CM

## 2023-08-10 DIAGNOSIS — I480 Paroxysmal atrial fibrillation: Secondary | ICD-10-CM | POA: Diagnosis not present

## 2023-08-10 DIAGNOSIS — N184 Chronic kidney disease, stage 4 (severe): Secondary | ICD-10-CM

## 2023-08-10 MED ORDER — METOPROLOL SUCCINATE ER 50 MG PO TB24: 75.0000 mg | ORAL_TABLET | Freq: Every day | ORAL | 3 refills | Status: AC

## 2023-08-10 MED ORDER — NITROGLYCERIN 0.4 MG SL SUBL: 0.4000 mg | SUBLINGUAL_TABLET | SUBLINGUAL | 6 refills | Status: AC | PRN

## 2023-08-10 MED ORDER — AMLODIPINE BESYLATE 5 MG PO TABS: 5.0000 mg | ORAL_TABLET | Freq: Every day | ORAL | 3 refills | Status: AC

## 2023-08-10 NOTE — Patient Instructions (Signed)
 Medication Instructions:   DECREASE: Amlodipine  to 5mg  daily  STOP: Metoprolol  Tartrate   START: Metoprolol  Succinate 75mg  daily   Lab Work: None Ordered If you have labs (blood work) drawn today and your tests are completely normal, you will receive your results only by: MyChart Message (if you have MyChart) OR A paper copy in the mail If you have any lab test that is abnormal or we need to change your treatment, we will call you to review the results.   Testing/Procedures: None Ordered   Follow-Up: At [facility], you and your health needs are our priority.  As part of our continuing mission to provide you with exceptional heart care, we have created designated Provider Care Teams.  These Care Teams include your primary Cardiologist (physician) and Advanced Practice Providers (APPs -  Physician Assistants and Nurse Practitioners) who all work together to provide you with the care you need, when you need it.  We recommend signing up for the patient portal called MyChart.  Sign up information is provided on this After Visit Summary.  MyChart is used to connect with patients for Virtual Visits (Telemedicine).  Patients are able to view lab/test results, encounter notes, upcoming appointments, etc.  Non-urgent messages can be sent to your provider as well.   To learn more about what you can do with MyChart, go to [email or web address].    Your next appointment:   4 month(s)  The format for your next appointment:   In Person  Provider:   Lamar Fitch, MD    Other Instructions NA

## 2023-08-10 NOTE — Telephone Encounter (Signed)
 Pt c/o medication issue:  1. Name of Medication:   amiodarone  (PACERONE ) 200 MG tablet    2. How are you currently taking this medication (dosage and times per day)? As written   3. Are you having a reaction (difficulty breathing--STAT)? No   4. What is your medication issue? Pt called in to asking if Dr. Bernie wanted to change this medication too.

## 2023-08-10 NOTE — Progress Notes (Signed)
 Cardiology Office Note:    Date:  08/10/2023   ID:  Amanda [name], DOB [birth year], MRN [id number]  PCP:  Street, Lonni HERO, MD  Cardiologist:  Lamar Fitch, MD    Referring MD: Street, Lonni HERO, *   Chief Complaint  Patient presents with   Follow-up    History of Present Illness:    Amanda [name] is a 83 y.o. female with very complex past medical history that include atrial fibrillation/atrial flutter, she is status post Watchman device, GI bleed while on anticoagulation, chronic kidney failure, coronary artery disease, complete heart block with pacemaker, obstructive sleep apnea, diabetes, dyslipidemia, moderate to severe mitral valve regurgitation.  Comes today to my office for follow-up.  Since I seen her last time she did see Dr. Wonda in the structural heart team and I would appreciate his expertise.  TEE has been done thereafter TEE show severe mitral regurgitation as well as moderate to severe tricuspid regurgitation however careful analysis of her case concluded that she will be too high risk to perform clip.  Therefore we will continue medical management.  She comes today to my office overall she says she is doing fine complain of weakness fatigue tiredness but overall coping with the situation quite well.  Past Medical History:  Diagnosis Date   (HFpEF) heart failure with preserved ejection fraction (HCC) 12/13/2022   Abnormal nuclear stress test 06/23/2016   Overview:   Added automatically from request for surgery [id number]     Acute ischemic stroke [facility])    Acute kidney failure, unspecified (HCC) 02/02/2022   Acute metabolic encephalopathy    Acute on chronic combined systolic and diastolic hrt fail (HCC) 12/16/2022   Acute on chronic systolic (congestive) heart failure (HCC)    Acute renal insufficiency 09/02/2016   Acute respiratory failure with hypoxia (HCC) 12/16/2022   Age-related osteoporosis without current pathological fracture 02/02/2022   AKI  (acute kidney injury) (HCC) 09/01/2016   Anemia due to stage 4 chronic kidney disease (HCC) 04/04/2015   Anemia in chronic kidney disease 02/02/2022   Atherosclerosis of aorta (HCC) 02/02/2022   Athscl heart disease of native coronary artery w/o ang pctrs 02/02/2022   Atrial fibrillation (HCC)    Atrial fibrillation with RVR (HCC) 12/06/2022   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) 04/04/2015   Bradycardia 09/17/2014   Chest pain 06/23/2016   Overview:  Added automatically from request for surgery [id number]   Chronic anemia    Chronic kidney disease (CKD), stage III (moderate) (HCC)    Chronic kidney disease, stage 4 (severe) (HCC) 02/02/2022   Chronic systolic CHF (congestive heart failure) (HCC)    Closed left hip fracture (HCC) 03/23/2021   Constipation 03/28/2021   Constipation, unspecified 02/02/2022   Coronary artery disease 10/12/2016   Non drug-eluting stent implanted in June 2018 to mid RCA   08/08/2016 10:37  Angiographic Findings  Cardiac Arteries and Lesion Findings LMCA: 0% and Normal. LAD: 0% and Normal. RCA:   Lesion on Mid RCA: Mid subsection.95% stenosis reduced to 0%. Pre procedure   TIMI II flow was noted. Post Procedure TIMI III flow was present. Poor run   off was present. The lesion was diagnosed as High Risk (C).    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) 04/04/2015   Diabetes mellitus without complication (HCC)    Diverticulosis    Dyslipidemia 09/17/2014   Gastro-esophageal reflux disease without esophagitis 02/02/2022   Gastroesophageal reflux  GI bleed 08/06/2016   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   History of cardiac monitoring 02/23/2018   monitor inserted   History of falling 02/02/2022   History of iron deficiency anemia 12/28/2017   History of transesophageal echocardiography (TEE)    Hypercalcemia 04/04/2015   Hyperlipidemia    Hyperlipidemia, unspecified 02/02/2022   Hypertensive  disorder 07/21/2017   Hypertensive heart disease with heart failure (HCC)    Hypothyroidism, unspecified 02/02/2022   Iron deficiency anemia 12/28/2017   Iron deficiency anemia, unspecified 02/02/2022   Ischemic cardiomyopathy 02/02/2022   Kidney disease 07/21/2017   Long term (current) use of insulin  (HCC) 02/02/2022   LV dysfunction 09/02/2016   Mitral regurgitation severe based on TEE from 2022 April 06/10/2020   Myocardial infarction [facility])    NSTEMI (non-ST elevated myocardial infarction) (HCC) 08/06/2016   Obstructive sleep apnea 09/17/2014   Obstructive sleep apnea (adult) (pediatric) 02/02/2022   Old myocardial infarction 02/02/2022   Orthostatic hypotension 12/28/2017   OSA on CPAP    Other specified heart block 02/02/2022   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   Paroxysmal atrial flutter (HCC) 07/22/2022   Personal history of other diseases of the digestive system 02/02/2022   Postural dizziness with presyncope 09/23/2017   Presence of cardiac pacemaker 02/02/2022   Presence of other cardiac implants and grafts 02/02/2022   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/24/2017   Prsnl hx of TIA (TIA), and cereb infrc w/o resid deficits 02/02/2022   Recurrent left pleural effusion 03/14/2015   Renal failure, chronic, stage 3 (moderate) (HCC)    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/2022   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   Secondary hypercoagulable state (HCC) 04/23/2021   Syncope 11/04/2017   Systolic heart failure (HCC)    Thyroid  disease    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   Unspecified atrial fibrillation (HCC) 12/16/2022   Vascular disorder of intestine, unspecified (HCC) 02/02/2022   Vitamin D  deficiency 04/04/2015    Past Surgical History:  Procedure Laterality Date   BIOPSY  02/15/2023   Procedure: BIOPSY;  Surgeon: Charlanne Groom, MD;  Location: [facility];   Service: Gastroenterology;;   VASSIE STUDY  05/16/2020   Procedure: BUBBLE STUDY;  Surgeon: Loni Soyla LABOR, MD;  Location: [facility] ENDOSCOPY;  Service: Cardiovascular;;   CARDIAC CATHETERIZATION     CARDIOVERSION N/A 05/16/2020   Procedure: CARDIOVERSION;  Surgeon: Loni Soyla LABOR, MD;  Location: [facility] ENDOSCOPY;  Service: Cardiovascular;  Laterality: N/A;   CARDIOVERSION N/A 09/06/2020   Procedure: CARDIOVERSION;  Surgeon: Kate Lonni CROME, MD;  Location: [facility] ENDOSCOPY;  Service: Cardiovascular;  Laterality: N/A;   CARDIOVERSION N/A 05/16/2021   Procedure: CARDIOVERSION;  Surgeon: Shlomo Wilbert SAUNDERS, MD;  Location: [facility] ENDOSCOPY;  Service: Cardiovascular;  Laterality: N/A;   CARDIOVERSION N/A 08/12/2022   Procedure: CARDIOVERSION;  Surgeon: Sheena Pugh, DO;  Location: [facility] INVASIVE CV LAB;  Service: Cardiovascular;  Laterality: N/A;   CARDIOVERSION N/A 09/30/2022   Procedure: CARDIOVERSION;  Surgeon: Lonni Slain, MD;  Location: [facility] INVASIVE CV LAB;  Service: Cardiovascular;  Laterality: N/A;   CARDIOVERSION N/A 12/08/2022   Procedure: CARDIOVERSION (CATH LAB);  Surgeon: Santo Stanly LABOR, MD;  Location: [facility] INVASIVE CV LAB;  Service: Cardiovascular;  Laterality: N/A;   CARDIOVERSION N/A 12/14/2022   Procedure: CARDIOVERSION (CATH LAB);  Surgeon: Lonni Slain, MD;  Location: [facility] INVASIVE CV LAB;  Service: Cardiovascular;  Laterality: N/A;   COLONOSCOPY  11/21/2014  Moderate predominantly sigmoid diverticulosis. Otherwise noraml collonscopy to TI.    CORONARY ANGIOPLASTY WITH STENT PLACEMENT  08/2016   ESOPHAGOGASTRODUODENOSCOPY (EGD) WITH PROPOFOL  N/A 02/15/2023   Procedure: ESOPHAGOGASTRODUODENOSCOPY (EGD) WITH PROPOFOL ;  Surgeon: Charlanne Groom, MD;  Location: [facility] ENDOSCOPY;  Service: Gastroenterology;  Laterality: N/A;   EXCISIONAL HEMORRHOIDECTOMY     LEFT ATRIAL APPENDAGE OCCLUSION  11/2016   in [place]   LOOP RECORDER INSERTION N/A 02/23/2018   Procedure: LOOP  RECORDER INSERTION;  Surgeon: Inocencio Soyla Lunger, MD;  Location: [facility] INVASIVE CV LAB;  Service: Cardiovascular;  Laterality: N/A;   LOOP RECORDER REMOVAL N/A 04/24/2019   Procedure: LOOP RECORDER REMOVAL;  Surgeon: Inocencio Soyla Lunger, MD;  Location: [facility] INVASIVE CV LAB;  Service: Cardiovascular;  Laterality: N/A;   MALONEY DILATION N/A 02/15/2023   Procedure: AGAPITO DILATION;  Surgeon: Charlanne Groom, MD;  Location: [facility] ENDOSCOPY;  Service: Gastroenterology;  Laterality: N/A;   NOSE SURGERY     PACEMAKER IMPLANT N/A 04/24/2019   Procedure: PACEMAKER IMPLANT;  Surgeon: Inocencio Soyla Lunger, MD;  Location: [facility] INVASIVE CV LAB;  Service: Cardiovascular;  Laterality: N/A;   TEE WITH CARDIOVERSION     TEE WITHOUT CARDIOVERSION N/A 05/16/2020   Procedure: TRANSESOPHAGEAL ECHOCARDIOGRAM (TEE);  Surgeon: Loni Soyla LABOR, MD;  Location: [facility] ENDOSCOPY;  Service: Cardiovascular;  Laterality: N/A;   TEE WITHOUT CARDIOVERSION N/A 08/12/2022   Procedure: TRANSESOPHAGEAL ECHOCARDIOGRAM;  Surgeon: Sheena Pugh, DO;  Location: [facility]VASIVE CV LAB;  Service: Cardiovascular;  Laterality: N/A;   TEE WITHOUT CARDIOVERSION N/A 09/30/2022   Procedure: TRANSESOPHAGEAL ECHOCARDIOGRAM;  Surgeon: Lonni Slain, MD;  Location: [facility] INVASIVE CV LAB;  Service: Cardiovascular;  Laterality: N/A;   TOTAL HIP REVISION Left 03/25/2021   Procedure: LEFT POSTERIOR TOTAL HIP  ZIMMER CABLES;  Surgeon: Ernie Cough, MD;  Location: [facility] ORS;  Service: Orthopedics;  Laterality: Left;   TRANSESOPHAGEAL ECHOCARDIOGRAM (CATH LAB) N/A 05/31/2023   Procedure: TRANSESOPHAGEAL ECHOCARDIOGRAM;  Surgeon: Francyne Headland, MD;  Location: [facility] INVASIVE CV LAB;  Service: Cardiovascular;  Laterality: N/A;    Current Medications: Current Meds  Medication Sig   amiodarone  (PACERONE ) 200 MG tablet Take 0.5 tablets (100 mg total) by mouth daily. (Patient taking differently: Take 200 mg by mouth daily.)   amLODipine  (NORVASC ) 10 MG tablet Take 10 mg  by mouth daily.   Biotin 1000 MCG tablet Take 1,000 mcg by mouth daily.   blood glucose meter kit and supplies KIT Dispense based on patient and insurance preference. Use up to four times daily as directed. (Patient taking differently: Inject 1 each into the skin See admin instructions. Dispense based on patient and insurance preference. Use up to four times daily as directed.)   carboxymethylcellul-glycerin (REFRESH RELIEVA) 0.5-0.9 % ophthalmic solution Place 1 drop into both eyes 4 (four) times daily as needed for dry eyes. VE   Cholecalciferol  (VITAMIN D3) 50 MCG (2000 UT) TABS Take 2,000 Units by mouth daily at 12 noon.   cloNIDine  (CATAPRES ) 0.1 MG tablet Take 0.1 mg by mouth 2 (two) times daily.   Coenzyme Q10 100 MG capsule Take 100 mg by mouth 2 (two) times daily.   colchicine 0.6 MG tablet Take 0.6 mg by mouth daily as needed (Gout).   denosumab  (PROLIA ) 60 MG/ML SOSY injection Inject 60 mg into the skin every 6 (six) months.   docusate sodium  (COLACE) 100 MG capsule Take 100 mg by mouth in the morning.   estradiol  (ESTRACE ) 0.1 MG/GM vaginal cream Place 1 Applicatorful vaginally every Monday, Wednesday,  and Friday.    Evolocumab  (REPATHA  SURECLICK) 140 MG/ML SOAJ Inject 140 mg into the skin every 14 (fourteen) days.   famotidine  (PEPCID ) 40 MG tablet Take 40 mg by mouth in the morning.   febuxostat  (ULORIC ) 40 MG tablet Take 40 mg by mouth every evening.   ferrous sulfate  325 (65 FE) MG EC tablet Take 325 mg by mouth 2 (two) times daily with breakfast and lunch.   folic acid  (FOLVITE ) 800 MCG tablet Take 800 mcg by mouth daily at 12 noon.   Glucosamine-Chondroitin (OSTEO BI-FLEX REGULAR STRENGTH PO) Take 25 mcg by mouth daily at 12 noon. Plus D3   HUMALOG KWIKPEN 100 UNIT/ML KwikPen Inject 2-6 Units into the skin See admin instructions. Per sliding scale 71-150=0 151-200=2 201-250=4 251-300=6 301-350=8 351-400=10 Check level in 2 hours and if no results call MD   hydrALAZINE   (APRESOLINE ) 50 MG tablet Take 1 tablet (50 mg total) by mouth every 6 (six) hours.   isosorbide  mononitrate (IMDUR ) 60 MG 24 hr tablet Take 60 mg by mouth daily.   Lactobacillus (FLORAJEN ACIDOPHILUS) CAPS Take 1 capsule by mouth daily at 12 noon. 390 mg each   levothyroxine  (SYNTHROID , LEVOTHROID) 50 MCG tablet Take 50 mcg by mouth daily before breakfast.   metoprolol  tartrate (LOPRESSOR ) 25 MG tablet Take 25 mg by mouth 2 (two) times daily.   Multiple Vitamins-Minerals (PRESERVISION AREDS 2) CAPS Take 1 capsule by mouth 2 (two) times daily.   nitroGLYCERIN  (NITROSTAT ) 0.4 MG SL tablet Place 0.4 mg under the tongue every 5 (five) minutes as needed for chest pain.   Nutritional Supplements (GLUCERNA MEAL PO) Take 237 mLs by mouth daily.   omega-3 acid ethyl esters (LOVAZA ) 1 g capsule TAKE 2 CAPSULES BY MOUTH DAILY.   pantoprazole  (PROTONIX ) 40 MG tablet Take 1 tablet (40 mg total) by mouth daily.   Polyethylene Glycol 3350  (MIRALAX  PO) Take 1 Capful by mouth daily as needed (Constipation).   pravastatin  (PRAVACHOL ) 40 MG tablet Take 40 mg by mouth every other day.   ranolazine  (RANEXA ) 500 MG 12 hr tablet Take 1 tablet (500 mg total) by mouth 2 (two) times daily.   torsemide  (DEMADEX ) 20 MG tablet Take 20 mg by mouth 2 (two) times daily.   vitamin B-12 (CYANOCOBALAMIN ) 1000 MCG tablet Take 1,000 mcg by mouth in the morning and at bedtime.   Wheat Dextrin (BENEFIBER PO) Take 3 tablets by mouth daily.     Allergies:   Ciprofloxacin, Contrast media [iodinated contrast media], Crestor [rosuvastatin], Lipitor [atorvastatin], Nsaids, Statins, and Sulfa antibiotics   Social History   Socioeconomic History   Marital status: Married    Spouse name: Not on file   Number of children: 2   Years of education: Not on file   Highest education level: Not on file  Occupational History   Occupation: Retired   Tobacco Use   Smoking status: Former   Smokeless tobacco: Former    Quit date: 1997    Tobacco comments:    Former smoker 05/22/21  Vaping Use   Vaping status: Never Used  Substance and Sexual Activity   Alcohol use: Yes    Alcohol/week: 2.0 standard drinks of alcohol    Types: 2 Standard drinks or equivalent per week    Comment: Occ   Drug use: No   Sexual activity: Not on file  Other Topics Concern   Not on file  Social History Narrative   Watchman device 2018   Social Drivers of Health  Financial Resource Strain: Not on file  Food Insecurity: No Food Insecurity (12/07/2022)   Hunger Vital Sign    Worried About Running Out of Food in the Last Year: Never true    Ran Out of Food in the Last Year: Never true  Transportation Needs: No Transportation Needs (12/07/2022)   PRAPARE - Administrator, Civil Service (Medical): No    Lack of Transportation (Non-Medical): No  Physical Activity: Not on file  Stress: Not on file  Social Connections: Not on file     Family History: The patient's family history includes Breast cancer in her mother; Cancer in her maternal grandfather; Diabetes in her sister; Heart attack in her brother, maternal uncle, and sister; Hypertension in her father; Prostate cancer in her father; Rectal cancer in her maternal uncle; Stroke in her father and maternal uncle. There is no history of Esophageal cancer, Pancreatic cancer, or Stomach cancer. ROS:   Please see the history of present illness.    All 14 point review of systems negative except as described per history of present illness  EKGs/Labs/Other Studies Reviewed:         Recent Labs: 10/15/2022: NT-Pro BNP 4,009 12/06/2022: B Natriuretic Peptide 1,011.0; TSH 2.510 12/14/2022: Magnesium 2.3 01/18/2023: ALT 27 03/12/2023: Platelets 196 05/31/2023: BUN 78; Creatinine, Ser 3.80; Hemoglobin 12.6; Potassium 4.2; Sodium 139  Recent Lipid Panel    Component Value Date/Time   CHOL 125 09/05/2019 0842   TRIG 162 (H) 09/05/2019 0842   HDL 59 09/05/2019 0842   CHOLHDL 2.1  09/05/2019 0842   LDLCALC 39 09/05/2019 0842    Physical Exam:    VS:  BP 110/60 (BP Location: Right Arm, Patient Position: Sitting)   Pulse (!) 129   Ht 5' 3 (1.6 m)   Wt 161 lb (73 kg)   SpO2 98%   BMI 28.52 kg/m     Wt Readings from Last 3 Encounters:  08/10/23 161 lb (73 kg)  05/31/23 157 lb (71.2 kg)  05/13/23 160 lb 3.2 oz (72.7 kg)     GEN:  Well nourished, well developed in no acute distress HEENT: Normal NECK: No JVD; No carotid bruits LYMPHATICS: No lymphadenopathy CARDIAC: RRR, heart click murmur grade 3/6 pressure left border of sternum, no rubs, no gallops RESPIRATORY:  Clear to auscultation without rales, wheezing or rhonchi  ABDOMEN: Soft, non-tender, non-distended MUSCULOSKELETAL:  No edema; No deformity  SKIN: Warm and dry LOWER EXTREMITIES: no swelling NEUROLOGIC:  Alert and oriented x 3 PSYCHIATRIC:  Normal affect   ASSESSMENT:    1. Paroxysmal atrial fibrillation (HCC)   2. 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)   3. Coronary artery disease involving native coronary artery of native heart without angina pectoris   4. Nonrheumatic mitral valve regurgitation    PLAN:    In order of problems listed above:  Mitral regurgitation: Moderate to severe, not a candidate for open heart surgery, evaluated by structural team for potential clip, disqualifies candidate for it.  Will continue medical management.  She seems to be hemodynamically compensated doing well. Permanent atrial flutter.  Rate appears to be excessive, will cut down amlodipine  to 5 mg daily I will increase beta-blocker from 25 twice daily metoprolol  titrate to 75 mg metoprolol  succinate. Coronary disease she described to have rare episode of chest pain will refill her nitroglycerin . Benign essential hypertension with facing opposite problem blood pressure being low. Chronic kidney failure.  Follow-up by nephrologist   Medication  Adjustments/Labs and Tests  Ordered: Current medicines are reviewed at length with the patient today.  Concerns regarding medicines are outlined above.  Orders Placed This Encounter  Procedures   EKG 12-Lead   Medication changes: No orders of the defined types were placed in this encounter.   Signed, Lamar DOROTHA Fitch, MD, [facility] 08/10/2023 10:00 AM    [facility]

## 2023-08-10 NOTE — Addendum Note (Signed)
 Addended by: ARLOA PLANAS D on: 08/10/2023 10:14 AM   Modules accepted: Orders

## 2023-08-10 NOTE — Addendum Note (Signed)
 Addended by: ARLOA PLANAS D on: 08/10/2023 11:14 AM   Modules accepted: Orders

## 2023-08-16 ENCOUNTER — Ambulatory Visit (HOSPITAL_BASED_OUTPATIENT_CLINIC_OR_DEPARTMENT_OTHER)
Admission: RE | Admit: 2023-08-16 | Discharge: 2023-08-16 | Disposition: A | Discharge status: Home / Self Care | Source: Ambulatory Visit | Attending: Family Medicine

## 2023-08-16 ENCOUNTER — Encounter (HOSPITAL_BASED_OUTPATIENT_CLINIC_OR_DEPARTMENT_OTHER): Payer: Self-pay

## 2023-08-16 VITALS — BP 136/72 | HR 77 | Temp 97.7°F | Resp 20

## 2023-08-16 DIAGNOSIS — R350 Frequency of micturition: Secondary | ICD-10-CM | POA: Diagnosis not present

## 2023-08-16 DIAGNOSIS — N390 Urinary tract infection, site not specified: Secondary | ICD-10-CM | POA: Diagnosis not present

## 2023-08-16 DIAGNOSIS — R300 Dysuria: Secondary | ICD-10-CM | POA: Diagnosis not present

## 2023-08-16 LAB — POCT URINALYSIS DIP (MANUAL ENTRY)
Bilirubin, UA: NEGATIVE
Glucose, UA: NEGATIVE mg/dL
Ketones, POC UA: NEGATIVE mg/dL
Nitrite, UA: NEGATIVE
Protein Ur, POC: 30 mg/dL — AB
Spec Grav, UA: 1.015 (ref 1.010–1.025)
Urobilinogen, UA: 0.2 U/dL
pH, UA: 5 (ref 5.0–8.0)

## 2023-08-16 MED ORDER — NITROFURANTOIN MONOHYD MACRO 100 MG PO CAPS: 100.0000 mg | ORAL_CAPSULE | Freq: Two times a day (BID) | ORAL | 0 refills | Status: DC

## 2023-08-16 MED ORDER — CEPHALEXIN 500 MG PO CAPS: 500.0000 mg | ORAL_CAPSULE | Freq: Three times a day (TID) | ORAL | 0 refills | Status: AC

## 2023-08-16 NOTE — ED Provider Notes (Signed)
 [name]    CSN: [id number] Arrival date & time: 08/16/23  1429      History   Chief Complaint Chief Complaint  Patient presents with   Urinary Frequency    Uti - Entered by patient    HPI Amanda [name] is a 83 y.o. female.   83 year old female that presents with dysuria and urinary frequency. Seen on 6/23 for dysuria, frequency.  Dx with UTI. Started on antibiotic but medication changed on 6/27. Patient reports has completed antibiotics and symptoms have returned. No fever, flank pain, N,V.    Urinary Frequency    Past Medical History:  Diagnosis Date   (HFpEF) heart failure with preserved ejection fraction (HCC) 12/13/2022   Abnormal nuclear stress test 06/23/2016   Overview:   Added automatically from request for surgery [id number]     Acute ischemic stroke [facility])    Acute kidney failure, unspecified (HCC) 02/02/2022   Acute metabolic encephalopathy    Acute on chronic combined systolic and diastolic hrt fail (HCC) 12/16/2022   Acute on chronic systolic (congestive) heart failure (HCC)    Acute renal insufficiency 09/02/2016   Acute respiratory failure with hypoxia (HCC) 12/16/2022   Age-related osteoporosis without current pathological fracture 02/02/2022   AKI (acute kidney injury) (HCC) 09/01/2016   Anemia due to stage 4 chronic kidney disease (HCC) 04/04/2015   Anemia in chronic kidney disease 02/02/2022   Atherosclerosis of aorta (HCC) 02/02/2022   Athscl heart disease of native coronary artery w/o ang pctrs 02/02/2022   Atrial fibrillation (HCC)    Atrial fibrillation with RVR (HCC) 12/06/2022   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) 04/04/2015   Bradycardia 09/17/2014   Chest pain 06/23/2016   Overview:  Added automatically from request for surgery [id number]   Chronic anemia    Chronic kidney disease (CKD), stage III (moderate) (HCC)    Chronic kidney disease, stage 4 (severe) (HCC) 02/02/2022   Chronic systolic  CHF (congestive heart failure) (HCC)    Closed left hip fracture (HCC) 03/23/2021   Constipation 03/28/2021   Constipation, unspecified 02/02/2022   Coronary artery disease 10/12/2016   Non drug-eluting stent implanted in June 2018 to mid RCA   08/08/2016 10:37  Angiographic Findings  Cardiac Arteries and Lesion Findings LMCA: 0% and Normal. LAD: 0% and Normal. RCA:   Lesion on Mid RCA: Mid subsection.95% stenosis reduced to 0%. Pre procedure   TIMI II flow was noted. Post Procedure TIMI III flow was present. Poor run   off was present. The lesion was diagnosed as High Risk (C).    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) 04/04/2015   Diabetes mellitus without complication (HCC)    Diverticulosis    Dyslipidemia 09/17/2014   Gastro-esophageal reflux disease without esophagitis 02/02/2022   Gastroesophageal reflux    GI bleed 08/06/2016   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   History of cardiac monitoring 02/23/2018   monitor inserted   History of falling 02/02/2022   History of iron deficiency anemia 12/28/2017   History of transesophageal echocardiography (TEE)    Hypercalcemia 04/04/2015   Hyperlipidemia    Hyperlipidemia, unspecified 02/02/2022   Hypertensive disorder 07/21/2017   Hypertensive heart disease with heart failure (HCC)    Hypothyroidism, unspecified 02/02/2022   Iron deficiency anemia 12/28/2017   Iron deficiency anemia, unspecified 02/02/2022   Ischemic cardiomyopathy 02/02/2022   Kidney disease 07/21/2017   Long term (current) use of  insulin  (HCC) 02/02/2022   LV dysfunction 09/02/2016   Mitral regurgitation severe based on TEE from 2022 April 06/10/2020   Myocardial infarction [facility])    NSTEMI (non-ST elevated myocardial infarction) (HCC) 08/06/2016   Obstructive sleep apnea 09/17/2014   Obstructive sleep apnea (adult) (pediatric) 02/02/2022   Old myocardial infarction 02/02/2022   Orthostatic hypotension 12/28/2017   OSA on  CPAP    Other specified heart block 02/02/2022   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   Paroxysmal atrial flutter (HCC) 07/22/2022   Personal history of other diseases of the digestive system 02/02/2022   Postural dizziness with presyncope 09/23/2017   Presence of cardiac pacemaker 02/02/2022   Presence of other cardiac implants and grafts 02/02/2022   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/24/2017   Prsnl hx of TIA (TIA), and cereb infrc w/o resid deficits 02/02/2022   Recurrent left pleural effusion 03/14/2015   Renal failure, chronic, stage 3 (moderate) (HCC)    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/2022   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   Secondary hypercoagulable state (HCC) 04/23/2021   Syncope 11/04/2017   Systolic heart failure (HCC)    Thyroid  disease    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   Unspecified atrial fibrillation (HCC) 12/16/2022   Vascular disorder of intestine, unspecified (HCC) 02/02/2022   Vitamin D  deficiency 04/04/2015    Patient Active Problem List   Diagnosis Date Noted   Heme positive stool 02/15/2023   Abnormal CT scan, liver 02/15/2023   Acute on chronic combined systolic and diastolic hrt fail (HCC) 12/16/2022   Acute respiratory failure with hypoxia (HCC) 12/16/2022   Hypertensive heart disease with heart failure (HCC) 12/16/2022   Unspecified atrial fibrillation (HCC) 12/16/2022   (HFpEF) heart failure with preserved ejection fraction (HCC) 12/13/2022   Atrial fibrillation with RVR (HCC) 12/06/2022   Atrial flutter (HCC) 07/22/2022   Acute kidney failure, unspecified (HCC) 02/02/2022   Age-related osteoporosis without current pathological fracture 02/02/2022   Anemia in chronic kidney disease 02/02/2022   Atherosclerosis of aorta (HCC) 02/02/2022   Athscl heart disease of native coronary artery w/o ang pctrs 02/02/2022   Chronic kidney disease,  stage 4 (severe) (HCC) 02/02/2022   Constipation, unspecified 02/02/2022   Gastro-esophageal reflux disease without esophagitis 02/02/2022   Gout, unspecified 02/02/2022   History of falling 02/02/2022   Hyperlipidemia, unspecified 02/02/2022   Hypothyroidism, unspecified 02/02/2022   Iron deficiency anemia, unspecified 02/02/2022   Ischemic cardiomyopathy 02/02/2022   Long term (current) use of insulin  (HCC) 02/02/2022   Obstructive sleep apnea (adult) (pediatric) 02/02/2022   Vascular disorder of intestine, unspecified (HCC) 02/02/2022   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/2022   Prsnl hx of TIA (TIA), and cereb infrc w/o resid deficits 02/02/2022   Old myocardial infarction 02/02/2022   Other specified heart block 02/02/2022   Personal history of other diseases of the digestive system 02/02/2022   Presence of cardiac pacemaker 02/02/2022   Presence of other cardiac implants and grafts 02/02/2022   Secondary hypercoagulable state (HCC) 04/23/2021   Constipation 03/28/2021   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   Closed left hip fracture (HCC) 03/23/2021   Mitral regurgitation severe based on TEE from 2022 April 06/10/2020   Thyroid  disease    Chronic systolic CHF (congestive heart failure) (HCC)    OSA on CPAP  Gastroesophageal reflux    Diverticulosis    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   History of cardiac monitoring 02/23/2018   History of iron deficiency anemia 12/28/2017   Kidney disease 07/21/2017   Hypertensive disorder 07/21/2017   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/24/2017   Coronary artery disease 10/12/2016   AKI (acute kidney injury) (HCC) 09/01/2016   Abnormal nuclear stress test 06/23/2016   Anemia due to stage 4 chronic kidney disease (HCC) 04/04/2015   Benign hypertension  with CKD (chronic kidney disease) stage IV (HCC) 04/04/2015   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) 04/04/2015   Vitamin D  deficiency 04/04/2015   Hypercalcemia 04/04/2015   Atrial fibrillation (HCC) 09/17/2014   Dyslipidemia 09/17/2014    Past Surgical History:  Procedure Laterality Date   BIOPSY  02/15/2023   Procedure: BIOPSY;  Surgeon: Charlanne Groom, MD;  Location: [facility];  Service: Gastroenterology;;   VASSIE STUDY  05/16/2020   Procedure: BUBBLE STUDY;  Surgeon: Loni Soyla LABOR, MD;  Location: [facility] ENDOSCOPY;  Service: Cardiovascular;;   CARDIAC CATHETERIZATION     CARDIOVERSION N/A 05/16/2020   Procedure: CARDIOVERSION;  Surgeon: Loni Soyla LABOR, MD;  Location: [facility] ENDOSCOPY;  Service: Cardiovascular;  Laterality: N/A;   CARDIOVERSION N/A 09/06/2020   Procedure: CARDIOVERSION;  Surgeon: Kate Lonni CROME, MD;  Location: [facility] ENDOSCOPY;  Service: Cardiovascular;  Laterality: N/A;   CARDIOVERSION N/A 05/16/2021   Procedure: CARDIOVERSION;  Surgeon: Shlomo Wilbert SAUNDERS, MD;  Location: [facility] ENDOSCOPY;  Service: Cardiovascular;  Laterality: N/A;   CARDIOVERSION N/A 08/12/2022   Procedure: CARDIOVERSION;  Surgeon: Sheena Pugh, DO;  Location: [facility] INVASIVE CV LAB;  Service: Cardiovascular;  Laterality: N/A;   CARDIOVERSION N/A 09/30/2022   Procedure: CARDIOVERSION;  Surgeon: Lonni Slain, MD;  Location: [facility] INVASIVE CV LAB;  Service: Cardiovascular;  Laterality: N/A;   CARDIOVERSION N/A 12/08/2022   Procedure: CARDIOVERSION (CATH LAB);  Surgeon: Santo Stanly LABOR, MD;  Location: [facility] INVASIVE CV LAB;  Service: Cardiovascular;  Laterality: N/A;   CARDIOVERSION N/A 12/14/2022   Procedure: CARDIOVERSION (CATH LAB);  Surgeon: Lonni Slain, MD;  Location: [facility] INVASIVE CV LAB;  Service: Cardiovascular;  Laterality: N/A;   COLONOSCOPY  11/21/2014   Moderate predominantly sigmoid diverticulosis. Otherwise noraml collonscopy to TI.     CORONARY ANGIOPLASTY WITH STENT PLACEMENT  08/2016   ESOPHAGOGASTRODUODENOSCOPY (EGD) WITH PROPOFOL  N/A 02/15/2023   Procedure: ESOPHAGOGASTRODUODENOSCOPY (EGD) WITH PROPOFOL ;  Surgeon: Charlanne Groom, MD;  Location: [facility] ENDOSCOPY;  Service: Gastroenterology;  Laterality: N/A;   EXCISIONAL HEMORRHOIDECTOMY     LEFT ATRIAL APPENDAGE OCCLUSION  11/2016   in [place]   LOOP RECORDER INSERTION N/A 02/23/2018   Procedure: LOOP RECORDER INSERTION;  Surgeon: Inocencio Soyla Lunger, MD;  Location: [facility]VASIVE CV LAB;  Service: Cardiovascular;  Laterality: N/A;   LOOP RECORDER REMOVAL N/A 04/24/2019   Procedure: LOOP RECORDER REMOVAL;  Surgeon: Inocencio Soyla Lunger, MD;  Location: [facility]VASIVE CV LAB;  Service: Cardiovascular;  Laterality: N/A;   MALONEY DILATION N/A 02/15/2023   Procedure: AGAPITO DILATION;  Surgeon: Charlanne Groom, MD;  Location: [facility] ENDOSCOPY;  Service: Gastroenterology;  Laterality: N/A;   NOSE SURGERY     PACEMAKER IMPLANT N/A 04/24/2019   Procedure: PACEMAKER IMPLANT;  Surgeon: Inocencio Soyla Lunger, MD;  Location: [facility]VASIVE CV LAB;  Service: Cardiovascular;  Laterality: N/A;   TEE WITH CARDIOVERSION     TEE WITHOUT CARDIOVERSION N/A 05/16/2020   Procedure: TRANSESOPHAGEAL  ECHOCARDIOGRAM (TEE);  Surgeon: Loni Soyla LABOR, MD;  Location: [facility] ENDOSCOPY;  Service: Cardiovascular;  Laterality: N/A;   TEE WITHOUT CARDIOVERSION N/A 08/12/2022   Procedure: TRANSESOPHAGEAL ECHOCARDIOGRAM;  Surgeon: Sheena Pugh, DO;  Location: [facility] CV LAB;  Service: Cardiovascular;  Laterality: N/A;   TEE WITHOUT CARDIOVERSION N/A 09/30/2022   Procedure: TRANSESOPHAGEAL ECHOCARDIOGRAM;  Surgeon: Lonni Slain, MD;  Location: [facility] INVASIVE CV LAB;  Service: Cardiovascular;  Laterality: N/A;   TOTAL HIP REVISION Left 03/25/2021   Procedure: LEFT POSTERIOR TOTAL HIP  ZIMMER CABLES;  Surgeon: Ernie Cough, MD;  Location: [facility]S;  Service: Orthopedics;  Laterality: Left;   TRANSESOPHAGEAL  ECHOCARDIOGRAM (CATH LAB) N/A 05/31/2023   Procedure: TRANSESOPHAGEAL ECHOCARDIOGRAM;  Surgeon: Francyne Headland, MD;  Location: [facility]VASIVE CV LAB;  Service: Cardiovascular;  Laterality: N/A;    OB History   No obstetric history on file.      Home Medications    Prior to Admission medications   Medication Sig Start Date End Date Taking? Authorizing Provider  cephALEXin  (KEFLEX ) 500 MG capsule Take 1 capsule (500 mg total) by mouth 3 (three) times daily for 7 days. 08/16/23 08/23/23 Yes Coleson Kant A, FNP  amiodarone  (PACERONE ) 200 MG tablet Take 0.5 tablets (100 mg total) by mouth daily. Patient taking differently: Take 200 mg by mouth daily. 05/10/23   Krasowski, Robert J, MD  amLODipine  (NORVASC ) 5 MG tablet Take 1 tablet (5 mg total) by mouth daily. 08/10/23 11/08/23  Krasowski, Robert J, MD  Biotin 1000 MCG tablet Take 1,000 mcg by mouth daily.    [provider]  blood glucose meter kit and supplies KIT Dispense based on patient and insurance preference. Use up to four times daily as directed. Patient taking differently: Inject 1 each into the skin See admin instructions. Dispense based on patient and insurance preference. Use up to four times daily as directed. 03/31/21   Swayze, Ava, DO  carboxymethylcellul-glycerin (REFRESH RELIEVA) 0.5-0.9 % ophthalmic solution Place 1 drop into both eyes 4 (four) times daily as needed for dry eyes. VE    [provider]  Cholecalciferol  (VITAMIN D3) 50 MCG (2000 UT) TABS Take 2,000 Units by mouth daily at 12 noon.    [provider]  cloNIDine  (CATAPRES ) 0.1 MG tablet Take 0.1 mg by mouth 2 (two) times daily. 12/25/22   [provider]  Coenzyme Q10 100 MG capsule Take 100 mg by mouth 2 (two) times daily.    [provider]  colchicine 0.6 MG tablet Take 0.6 mg by mouth daily as needed (Gout).    [provider]  denosumab  (PROLIA ) 60 MG/ML SOSY injection Inject 60 mg into the skin every 6 (six) months.     [provider]  docusate sodium  (COLACE) 100 MG capsule Take 100 mg by mouth in the morning.    [provider]  estradiol  (ESTRACE ) 0.1 MG/GM vaginal cream Place 1 Applicatorful vaginally every Monday, Wednesday, and Friday.     [provider]  Evolocumab  (REPATHA  SURECLICK) 140 MG/ML SOAJ Inject 140 mg into the skin every 14 (fourteen) days. 06/10/23   Krasowski, Robert J, MD  famotidine  (PEPCID ) 40 MG tablet Take 40 mg by mouth in the morning. 03/10/23   [provider]  febuxostat  (ULORIC ) 40 MG tablet Take 40 mg by mouth every evening. 05/06/23   [provider]  ferrous sulfate  325 (65 FE) MG EC tablet Take 325 mg by mouth 2 (two) times daily with breakfast and lunch. 12/28/17  [provider]  folic acid  (FOLVITE ) 800 MCG tablet Take 800 mcg by mouth daily at 12 noon.    [provider]  Glucosamine-Chondroitin (OSTEO BI-FLEX REGULAR STRENGTH PO) Take 25 mcg by mouth daily at 12 noon. Plus D3    [provider]  HUMALOG KWIKPEN 100 UNIT/ML KwikPen Inject 2-6 Units into the skin See admin instructions. Per sliding scale 71-150=0 151-200=2 201-250=4 251-300=6 301-350=8 351-400=10 Check level in 2 hours and if no results call MD 12/25/22   [provider]  hydrALAZINE  (APRESOLINE ) 50 MG tablet Take 1 tablet (50 mg total) by mouth every 6 (six) hours. 12/08/22   Krasowski, Robert J, MD  isosorbide  mononitrate (IMDUR ) 60 MG 24 hr tablet Take 60 mg by mouth daily.    [provider]  Lactobacillus (FLORAJEN ACIDOPHILUS) CAPS Take 1 capsule by mouth daily at 12 noon. 390 mg each    [provider]  levothyroxine  (SYNTHROID , LEVOTHROID) 50 MCG tablet Take 50 mcg by mouth daily before breakfast.    [provider]  metoprolol  succinate (TOPROL -XL) 50 MG 24 hr tablet Take 1.5 tablets (75 mg total) by mouth daily. Take with or immediately following a meal. 08/10/23 11/08/23  Krasowski, Robert J,  MD  Multiple Vitamins-Minerals (PRESERVISION AREDS 2) CAPS Take 1 capsule by mouth 2 (two) times daily.    [provider]  nitroGLYCERIN  (NITROSTAT ) 0.4 MG SL tablet Place 1 tablet (0.4 mg total) under the tongue every 5 (five) minutes as needed for chest pain. 08/10/23   Krasowski, Robert J, MD  Nutritional Supplements (GLUCERNA MEAL PO) Take 237 mLs by mouth daily.    [provider]  omega-3 acid ethyl esters (LOVAZA ) 1 g capsule TAKE 2 CAPSULES BY MOUTH DAILY. 06/03/23   Krasowski, Robert J, MD  pantoprazole  (PROTONIX ) 40 MG tablet Take 1 tablet (40 mg total) by mouth daily. 10/14/21   Krasowski, Robert J, MD  Polyethylene Glycol 3350  (MIRALAX  PO) Take 1 Capful by mouth daily as needed (Constipation).    [provider]  pravastatin  (PRAVACHOL ) 40 MG tablet Take 40 mg by mouth every other day.    [provider]  ranolazine  (RANEXA ) 500 MG 12 hr tablet Take 1 tablet (500 mg total) by mouth 2 (two) times daily. 04/26/23   Krasowski, Robert J, MD  torsemide  (DEMADEX ) 20 MG tablet Take 20 mg by mouth 2 (two) times daily. 12/25/22   [provider]  vitamin B-12 (CYANOCOBALAMIN ) 1000 MCG tablet Take 1,000 mcg by mouth in the morning and at bedtime.    [provider]  Wheat Dextrin (BENEFIBER PO) Take 3 tablets by mouth daily.    [provider]    Family History Family History  Problem Relation Age of Onset   Breast cancer Mother    Hypertension Father    Prostate cancer Father    Stroke Father    Diabetes Sister    Heart attack Sister    Heart attack Brother    Cancer Maternal Grandfather        not sure if it is colon or rectal    Heart attack Maternal Uncle    Rectal cancer Maternal Uncle    Stroke Maternal Uncle    Esophageal cancer Neg Hx    Pancreatic cancer Neg Hx    Stomach cancer Neg Hx     Social History Social History   Tobacco Use   Smoking status: Former   Smokeless tobacco: Former    Quit date: 1997  Tobacco comments:    Former smoker 05/22/21  Vaping Use   Vaping status: Never Used  Substance Use Topics   Alcohol use: Yes    Alcohol/week: 2.0 standard drinks of alcohol    Types: 2 Standard drinks or equivalent per week    Comment: Occ   Drug use: No     Allergies   Ciprofloxacin, Contrast media [iodinated contrast media], Crestor [rosuvastatin], Lipitor [atorvastatin], Nsaids, Statins, and Sulfa antibiotics   Review of Systems Review of Systems  Genitourinary:  Positive for frequency.     Physical Exam Triage Vital Signs ED Triage Vitals  Encounter Vitals Group     BP 08/16/23 1439 136/72     Girls Systolic BP Percentile --      Girls Diastolic BP Percentile --      Boys Systolic BP Percentile --      Boys Diastolic BP Percentile --      Pulse Rate 08/16/23 1439 77     Resp 08/16/23 1439 20     Temp 08/16/23 1439 97.7 F (36.5 C)     Temp Source 08/16/23 1439 Oral     SpO2 08/16/23 1439 97 %     Weight --      Height --      Head Circumference --      Peak Flow --      Pain Score 08/16/23 1441 2     Pain Loc --      Pain Education --      Exclude from Growth Chart --    No data found.  Updated Vital Signs BP 136/72 (BP Location: Left Arm)   Pulse 77   Temp 97.7 F (36.5 C) (Oral)   Resp 20   SpO2 97%   Visual Acuity Right Eye Distance:   Left Eye Distance:   Bilateral Distance:    Right Eye Near:   Left Eye Near:    Bilateral Near:     Physical Exam Vitals and nursing note reviewed.  Constitutional:      General: She is not in acute distress.    Appearance: Normal appearance. She is not ill-appearing, toxic-appearing or diaphoretic.  Pulmonary:     Effort: Pulmonary effort is normal.  Neurological:     Mental Status: She is alert.  Psychiatric:        Mood and Affect: Mood normal.      UC Treatments / Results  Labs (all labs ordered are listed, but only abnormal results are displayed) Labs Reviewed  POCT URINALYSIS DIP (MANUAL  ENTRY) - Abnormal; Notable for the following components:      Result Value   Color, UA light yellow (*)    Clarity, UA cloudy (*)    Blood, UA trace-intact (*)    Protein Ur, POC =30 (*)    Leukocytes, UA Large (3+) (*)    All other components within normal limits  URINE CULTURE    EKG   Radiology No results found.  Procedures Procedures (including critical care time)  Medications Ordered in UC Medications - No data to display  Initial Impression / Assessment and Plan / UC Course  I have reviewed the triage vital signs and the nursing notes.  Pertinent labs & imaging results that were available during my care of the patient were reviewed by me and considered in my medical decision making (see chart for details).      Urinary frequency-urine with large leuks, trace blood, protein.  Consistent with urinary tract  infection.  This is a recurrent issue for her.  Will send for culture.  Will go ahead and treat with Keflex  pending culture results.  She responded well to this last time Recommended for these recurrent infections if this happens again she may want to follow-up with urology for further management Patient is standing and agreed to plan  Final Clinical Impressions(s) / UC Diagnoses   Final diagnoses:  Urinary frequency     Discharge Instructions      Treating you for a urinary tract infection. Take the medication as prescribed.  This is 2 infections very close together. You may want to talk to your doctor about this.  You may benefit from urology referral     ED Prescriptions     Medication Sig Dispense Auth. Provider   nitrofurantoin , macrocrystal-monohydrate, (MACROBID ) 100 MG capsule  (Status: Discontinued) Take 1 capsule (100 mg total) by mouth 2 (two) times daily for 7 days. 14 capsule Sigfredo Schreier A, FNP   cephALEXin  (KEFLEX ) 500 MG capsule Take 1 capsule (500 mg total) by mouth 3 (three) times daily for 7 days. 21 capsule Adah Wilbert LABOR, FNP       PDMP not reviewed this encounter.   Adah Wilbert LABOR, FNP 08/16/23 1545

## 2023-08-16 NOTE — Discharge Instructions (Signed)
 Treating you for a urinary tract infection. Take the medication as prescribed.  This is 2 infections very close together. You may want to talk to your doctor about this.  You may benefit from urology referral

## 2023-08-16 NOTE — ED Triage Notes (Signed)
 Seen on 6/23 for dysuria, frequency.  Dx with UTI. Started on antibiotic but medication changed on 6/27. Patient reports has completed antibiotics and symptoms have returned.

## 2023-08-18 ENCOUNTER — Ambulatory Visit (HOSPITAL_COMMUNITY): Payer: Self-pay

## 2023-08-18 LAB — URINE CULTURE: Culture: >=100000 — AB

## 2023-08-23 DIAGNOSIS — N3020 Other chronic cystitis without hematuria: Secondary | ICD-10-CM | POA: Diagnosis not present

## 2023-08-23 DIAGNOSIS — N905 Atrophy of vulva: Secondary | ICD-10-CM | POA: Diagnosis not present

## 2023-08-26 NOTE — Progress Notes (Signed)
 Remote pacemaker transmission.

## 2023-09-20 DIAGNOSIS — M810 Age-related osteoporosis without current pathological fracture: Secondary | ICD-10-CM | POA: Diagnosis not present

## 2023-09-21 DIAGNOSIS — I129 Hypertensive chronic kidney disease with stage 1 through stage 4 chronic kidney disease, or unspecified chronic kidney disease: Secondary | ICD-10-CM | POA: Diagnosis not present

## 2023-09-21 DIAGNOSIS — Z6828 Body mass index (BMI) 28.0-28.9, adult: Secondary | ICD-10-CM | POA: Diagnosis not present

## 2023-09-21 DIAGNOSIS — Z8639 Personal history of other endocrine, nutritional and metabolic disease: Secondary | ICD-10-CM | POA: Diagnosis not present

## 2023-09-21 DIAGNOSIS — E1122 Type 2 diabetes mellitus with diabetic chronic kidney disease: Secondary | ICD-10-CM | POA: Diagnosis not present

## 2023-10-05 DIAGNOSIS — R8271 Bacteriuria: Secondary | ICD-10-CM | POA: Diagnosis not present

## 2023-10-05 DIAGNOSIS — N905 Atrophy of vulva: Secondary | ICD-10-CM | POA: Diagnosis not present

## 2023-10-05 DIAGNOSIS — N3020 Other chronic cystitis without hematuria: Secondary | ICD-10-CM | POA: Diagnosis not present

## 2023-10-05 DIAGNOSIS — N3000 Acute cystitis without hematuria: Secondary | ICD-10-CM | POA: Diagnosis not present

## 2023-10-18 ENCOUNTER — Ambulatory Visit (INDEPENDENT_AMBULATORY_CARE_PROVIDER_SITE_OTHER): Payer: Medicare Other

## 2023-10-18 DIAGNOSIS — I480 Paroxysmal atrial fibrillation: Secondary | ICD-10-CM | POA: Diagnosis not present

## 2023-10-19 LAB — CUP PACEART REMOTE DEVICE CHECK
Battery Remaining Longevity: 121 mo
Battery Voltage: 3 V
Brady Statistic RA Percent Paced: 0.07 %
Brady Statistic RV Percent Paced: 50.11 %
Date Time Interrogation Session: 20250914220512
Implantable Lead Connection Status: 753985
Implantable Lead Connection Status: 753985
Implantable Lead Implant Date: 20210322
Implantable Lead Implant Date: 20210322
Implantable Lead Location: 753859
Implantable Lead Location: 753860
Implantable Lead Model: 5076
Implantable Lead Model: 5076
Implantable Pulse Generator Implant Date: 20210322
Lead Channel Impedance Value: 247 Ohm
Lead Channel Impedance Value: 285 Ohm
Lead Channel Impedance Value: 342 Ohm
Lead Channel Impedance Value: 399 Ohm
Lead Channel Pacing Threshold Amplitude: 0.625 V
Lead Channel Pacing Threshold Amplitude: 0.875 V
Lead Channel Pacing Threshold Pulse Width: 0.4 ms
Lead Channel Pacing Threshold Pulse Width: 0.4 ms
Lead Channel Sensing Intrinsic Amplitude: 5 mV
Lead Channel Sensing Intrinsic Amplitude: 5 mV
Lead Channel Sensing Intrinsic Amplitude: 7.375 mV
Lead Channel Sensing Intrinsic Amplitude: 7.375 mV
Lead Channel Setting Pacing Amplitude: 1.5 V
Lead Channel Setting Pacing Amplitude: 2.5 V
Lead Channel Setting Pacing Pulse Width: 0.4 ms
Lead Channel Setting Sensing Sensitivity: 2 mV
Zone Setting Status: 755011
Zone Setting Status: 755011

## 2023-10-20 DIAGNOSIS — E559 Vitamin D deficiency, unspecified: Secondary | ICD-10-CM | POA: Diagnosis not present

## 2023-10-20 DIAGNOSIS — N184 Chronic kidney disease, stage 4 (severe): Secondary | ICD-10-CM | POA: Diagnosis not present

## 2023-10-20 DIAGNOSIS — D631 Anemia in chronic kidney disease: Secondary | ICD-10-CM | POA: Diagnosis not present

## 2023-10-20 DIAGNOSIS — I129 Hypertensive chronic kidney disease with stage 1 through stage 4 chronic kidney disease, or unspecified chronic kidney disease: Secondary | ICD-10-CM | POA: Diagnosis not present

## 2023-10-20 LAB — LAB REPORT - SCANNED: EGFR: 15

## 2023-10-22 DIAGNOSIS — H353231 Exudative age-related macular degeneration, bilateral, with active choroidal neovascularization: Secondary | ICD-10-CM | POA: Diagnosis not present

## 2023-10-22 DIAGNOSIS — E113213 Type 2 diabetes mellitus with mild nonproliferative diabetic retinopathy with macular edema, bilateral: Secondary | ICD-10-CM | POA: Diagnosis not present

## 2023-10-22 DIAGNOSIS — H353132 Nonexudative age-related macular degeneration, bilateral, intermediate dry stage: Secondary | ICD-10-CM | POA: Diagnosis not present

## 2023-10-22 DIAGNOSIS — H43813 Vitreous degeneration, bilateral: Secondary | ICD-10-CM | POA: Diagnosis not present

## 2023-10-22 DIAGNOSIS — H26493 Other secondary cataract, bilateral: Secondary | ICD-10-CM | POA: Diagnosis not present

## 2023-10-23 ENCOUNTER — Ambulatory Visit: Payer: Self-pay | Admitting: Cardiology

## 2023-10-23 NOTE — Progress Notes (Addendum)
 Remote PPM Transmission

## 2023-10-26 DIAGNOSIS — N185 Chronic kidney disease, stage 5: Secondary | ICD-10-CM | POA: Diagnosis not present

## 2023-10-26 DIAGNOSIS — E1122 Type 2 diabetes mellitus with diabetic chronic kidney disease: Secondary | ICD-10-CM | POA: Diagnosis not present

## 2023-10-26 DIAGNOSIS — M898X9 Other specified disorders of bone, unspecified site: Secondary | ICD-10-CM | POA: Diagnosis not present

## 2023-10-26 DIAGNOSIS — I120 Hypertensive chronic kidney disease with stage 5 chronic kidney disease or end stage renal disease: Secondary | ICD-10-CM | POA: Diagnosis not present

## 2023-11-03 NOTE — Progress Notes (Signed)
  Electrophysiology Office Note:   Date:  11/08/2023  ID:  Amanda [name], DOB 07-17-1940, MRN [id number]  Primary Cardiologist: Lamar Fitch, MD Primary Heart Failure: None Electrophysiologist: Omesha Bowerman Gladis Norton, MD      History of Present Illness:   Amanda [name] is a 83 y.o. female with h/o atrial fibrillation/flutter post watchman, GI bleed while on anticoagulation, CKD, coronary disease, complete heart block, obstructive sleep apnea, diabetes, hyperlipidemia, mitral regurgitation seen today for routine electrophysiology followup.   Since last being seen in our clinic the patient reports doing overall well.  She continues to have her baseline fatigue and shortness of breath, but otherwise has no acute complaints.  she denies chest pain, palpitations, PND, orthopnea, nausea, vomiting, dizziness, syncope, edema, weight gain, or early satiety.   Review of systems complete and found to be negative unless listed in HPI.      EP Information / Studies Reviewed:    EKG is ordered today. Personal review as below.  EKG Interpretation Date/Time:  Monday November 08 2023 09:52:07 EDT Ventricular Rate:  76 PR Interval:    QRS Duration:  178 QT Interval:  480 QTC Calculation: 540 R Axis:   175  Text Interpretation: Atrial flutter Ventricular-paced rhythm Abnormal ECG When compared with ECG of 10-Aug-2023 09:34, Premature ventricular complexes are no longer Present Confirmed by Tawni Melkonian ([id number]) on 11/08/2023 9:54:08 AM   PPM Interrogation-  reviewed in detail today,  See PACEART report.  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 The patient's score is based upon: CHF History: 0 HTN History: 1 Diabetes History: 1 Stroke History: 2 Vascular Disease History: 0 Age Score: 2 Gender Score: 1           Physical Exam:   VS:  BP (!) 144/82    Pulse 76   Ht 5' 3 (1.6 m)   Wt 158 lb (71.7 kg)   SpO2 99%   BMI 27.99 kg/m    Wt Readings from Last 3 Encounters:  11/08/23 158 lb (71.7 kg)  08/10/23 161 lb (73 kg)  05/31/23 157 lb (71.2 kg)     GEN: Well nourished, well developed in no acute distress NECK: No JVD; No carotid bruits CARDIAC: Regular rate and rhythm, no murmurs, rubs, gallops RESPIRATORY:  Clear to auscultation without rales, wheezing or rhonchi  ABDOMEN: Soft, non-tender, non-distended EXTREMITIES:  No edema; No deformity   ASSESSMENT AND PLAN:    CHB s/p Medtronic PPM  Normal PPM function See Pace Art report No changes today  2.  Permanent atrial fibrillation/flutter: Patient is in atrial flutter today.  She is on amiodarone .  Micaylah Bertucci stop amiodarone  for now.  She has no other indication.  Jakari Jacot continue with current management.  Trafton Roker discuss stopping amiodarone  with her primary cardiologist.  3.  Severe mitral and tricuspid regurgitation: Has had TEE and thought not a candidate for mitral valve clip.  Medical management per primary cardiology.  4.  Hypertension: Mildly elevated but usually well-controlled  5.  Secondary hypercoagulable state: Continue Eliquis .  Patient is post watchman  Disposition:   Follow up with EP Team 2 years   Signed, Caidence Higashi Gladis Norton, MD

## 2023-11-08 ENCOUNTER — Encounter: Payer: Self-pay | Admitting: Cardiology

## 2023-11-08 ENCOUNTER — Ambulatory Visit: Attending: Cardiology | Admitting: Cardiology

## 2023-11-08 ENCOUNTER — Ambulatory Visit: Payer: Self-pay | Admitting: Cardiology

## 2023-11-08 VITALS — BP 144/82 | HR 76 | Ht 63.0 in | Wt 158.0 lb

## 2023-11-08 DIAGNOSIS — D6869 Other thrombophilia: Secondary | ICD-10-CM | POA: Diagnosis not present

## 2023-11-08 DIAGNOSIS — I10 Essential (primary) hypertension: Secondary | ICD-10-CM | POA: Diagnosis not present

## 2023-11-08 DIAGNOSIS — I4819 Other persistent atrial fibrillation: Secondary | ICD-10-CM

## 2023-11-08 DIAGNOSIS — I4821 Permanent atrial fibrillation: Secondary | ICD-10-CM

## 2023-11-08 DIAGNOSIS — I442 Atrioventricular block, complete: Secondary | ICD-10-CM

## 2023-11-08 LAB — CUP PACEART INCLINIC DEVICE CHECK
Date Time Interrogation Session: 20251006100542
Implantable Lead Connection Status: 753985
Implantable Lead Connection Status: 753985
Implantable Lead Implant Date: 20210322
Implantable Lead Implant Date: 20210322
Implantable Lead Location: 753859
Implantable Lead Location: 753860
Implantable Lead Model: 5076
Implantable Lead Model: 5076
Implantable Pulse Generator Implant Date: 20210322

## 2023-11-08 NOTE — Patient Instructions (Addendum)
 Medication Instructions:  Your physician has recommended you make the following change in your medication: STOP Amiodarone   *If you need a refill on your cardiac medications before your next appointment, please call your pharmacy*  Lab Work: None ordered  Testing/Procedures: None ordered  Follow-Up: At [facility], you and your health needs are our priority.  As part of our continuing mission to provide you with exceptional heart care, our providers are all part of one team.  This team includes your primary Cardiologist (physician) and Advanced Practice Providers or APPs (Physician Assistants and Nurse Practitioners) who all work together to provide you with the care you need, when you need it.  Remote monitoring is used to monitor your Pacemaker or ICD from home. This monitoring reduces the number of office visits required to check your device to one time per year. It allows us  to keep an eye on the functioning of your device to ensure it is working properly. You are scheduled for a device check from home on 01/17/24. You may send your transmission at any time that day. If you have a wireless device, the transmission will be sent automatically. After your physician reviews your transmission, you will receive a postcard with your next transmission date.   Your next appointment:   2 year(s)  Provider:   Soyla Norton, MD    Thank you for choosing [facility]!!   Maeola Domino, RN [phone]

## 2023-11-24 ENCOUNTER — Encounter (HOSPITAL_BASED_OUTPATIENT_CLINIC_OR_DEPARTMENT_OTHER): Payer: Self-pay

## 2023-11-24 ENCOUNTER — Other Ambulatory Visit (HOSPITAL_BASED_OUTPATIENT_CLINIC_OR_DEPARTMENT_OTHER): Payer: Self-pay

## 2023-11-24 ENCOUNTER — Ambulatory Visit (HOSPITAL_BASED_OUTPATIENT_CLINIC_OR_DEPARTMENT_OTHER)
Admission: RE | Admit: 2023-11-24 | Discharge: 2023-11-24 | Disposition: A | Discharge status: Home / Self Care | Source: Ambulatory Visit | Attending: Physician Assistant | Admitting: Physician Assistant

## 2023-11-24 VITALS — BP 146/85 | HR 85 | Temp 97.4°F | Resp 20

## 2023-11-24 DIAGNOSIS — N3001 Acute cystitis with hematuria: Secondary | ICD-10-CM | POA: Insufficient documentation

## 2023-11-24 LAB — POCT URINE DIPSTICK
Bilirubin, UA: NEGATIVE
Glucose, UA: NEGATIVE mg/dL
Ketones, POC UA: NEGATIVE mg/dL
Nitrite, UA: NEGATIVE
Protein Ur, POC: 100 mg/dL — AB
Spec Grav, UA: 1.02 (ref 1.010–1.025)
Urobilinogen, UA: 0.2 U/dL
pH, UA: 5.5 (ref 5.0–8.0)

## 2023-11-24 MED ORDER — CEFPODOXIME PROXETIL 100 MG PO TABS: 100.0000 mg | ORAL_TABLET | Freq: Every day | ORAL | 0 refills | Status: DC

## 2023-11-24 MED ORDER — CEFPODOXIME PROXETIL 100 MG PO TABS
100.0000 mg | ORAL_TABLET | Freq: Every day | ORAL | 0 refills | Status: DC
  Filled 2023-11-24: qty 7, 7d supply, fill #0

## 2023-11-24 NOTE — ED Provider Notes (Signed)
 [name]    CSN: [id number] Arrival date & time: 11/24/23  1449      History   Chief Complaint Chief Complaint  Patient presents with   Urinary Frequency    Another UTI - Entered by patient    HPI Amanda [name] is a 83 y.o. female.   Patient presents today with a 36-hour history of UTI symptoms.  She reports dysuria, frequency, urgency.  She denies any fever, abdominal pain, nausea, vomiting, diarrhea, vaginal symptoms, flank pain.  She does have a history of recurrent urinary tract infection is followed by urology.  She was last treated for recurrent UTI October 05, 2023 with cephalexin .  Denies additional antibiotics in the past 90 days.  She denies any recent urogenital procedure, self-catheterization.  She does have a history of chronic kidney disease but has tolerated cephalosporins without difficulty.  She denies any history of nephrolithiasis.  She does have a history of diabetes but does not take any SGLT2 inhibitor.    Past Medical History:  Diagnosis Date   (HFpEF) heart failure with preserved ejection fraction (HCC) 12/13/2022   Abnormal nuclear stress test 06/23/2016   Overview:   Added automatically from request for surgery [id number]     Acute ischemic stroke (HCC)    Acute kidney failure, unspecified 02/02/2022   Acute metabolic encephalopathy    Acute on chronic combined systolic and diastolic hrt fail (HCC) 12/16/2022   Acute on chronic systolic (congestive) heart failure (HCC)    Acute renal insufficiency 09/02/2016   Acute respiratory failure with hypoxia (HCC) 12/16/2022   Age-related osteoporosis without current pathological fracture 02/02/2022   AKI (acute kidney injury) 09/01/2016   Anemia due to stage 4 chronic kidney disease (HCC) 04/04/2015   Anemia in chronic kidney disease 02/02/2022   Atherosclerosis of aorta 02/02/2022   Athscl heart disease of native coronary artery w/o ang pctrs 02/02/2022   Atrial fibrillation (HCC)    Atrial  fibrillation with RVR (HCC) 12/06/2022   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) 04/04/2015   Bradycardia 09/17/2014   Chest pain 06/23/2016   Overview:  Added automatically from request for surgery [id number]   Chronic anemia    Chronic kidney disease (CKD), stage III (moderate) (HCC)    Chronic kidney disease, stage 4 (severe) (HCC) 02/02/2022   Chronic systolic CHF (congestive heart failure) (HCC)    Closed left hip fracture (HCC) 03/23/2021   Constipation 03/28/2021   Constipation, unspecified 02/02/2022   Coronary artery disease 10/12/2016   Non drug-eluting stent implanted in June 2018 to mid RCA   08/08/2016 10:37  Angiographic Findings  Cardiac Arteries and Lesion Findings LMCA: 0% and Normal. LAD: 0% and Normal. RCA:   Lesion on Mid RCA: Mid subsection.95% stenosis reduced to 0%. Pre procedure   TIMI II flow was noted. Post Procedure TIMI III flow was present. Poor run   off was present. The lesion was diagnosed as High Risk (C).    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) 04/04/2015   Diabetes mellitus without complication (HCC)    Diverticulosis    Dyslipidemia 09/17/2014   Gastro-esophageal reflux disease without esophagitis 02/02/2022   Gastroesophageal reflux    GI bleed 08/06/2016   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   History of cardiac monitoring 02/23/2018   monitor inserted   History of falling 02/02/2022   History of iron deficiency anemia 12/28/2017   History of transesophageal echocardiography (TEE)  Hypercalcemia 04/04/2015   Hyperlipidemia    Hyperlipidemia, unspecified 02/02/2022   Hypertensive disorder 07/21/2017   Hypertensive heart disease with heart failure (HCC)    Hypothyroidism, unspecified 02/02/2022   Iron deficiency anemia 12/28/2017   Iron deficiency anemia, unspecified 02/02/2022   Ischemic cardiomyopathy 02/02/2022   Kidney disease 07/21/2017   Long term (current) use of insulin   (HCC) 02/02/2022   LV dysfunction 09/02/2016   Mitral regurgitation severe based on TEE from 2022 April 06/10/2020   Myocardial infarction [facility])    NSTEMI (non-ST elevated myocardial infarction) (HCC) 08/06/2016   Obstructive sleep apnea 09/17/2014   Obstructive sleep apnea (adult) (pediatric) 02/02/2022   Old myocardial infarction 02/02/2022   Orthostatic hypotension 12/28/2017   OSA on CPAP    Other specified heart block 02/02/2022   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   Paroxysmal atrial flutter (HCC) 07/22/2022   Personal history of other diseases of the digestive system 02/02/2022   Postural dizziness with presyncope 09/23/2017   Presence of cardiac pacemaker 02/02/2022   Presence of other cardiac implants and grafts 02/02/2022   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/24/2017   Prsnl hx of TIA (TIA), and cereb infrc w/o resid deficits 02/02/2022   Recurrent left pleural effusion 03/14/2015   Renal failure, chronic, stage 3 (moderate) (HCC)    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/2022   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   Secondary hypercoagulable state 04/23/2021   Syncope 11/04/2017   Systolic heart failure (HCC)    Thyroid  disease    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   Unspecified atrial fibrillation (HCC) 12/16/2022   Vascular disorder of intestine, unspecified 02/02/2022   Vitamin D  deficiency 04/04/2015    Patient Active Problem List   Diagnosis Date Noted   Heme positive stool 02/15/2023   Abnormal CT scan, liver 02/15/2023   Acute on chronic combined systolic and diastolic hrt fail (HCC) 12/16/2022   Acute respiratory failure with hypoxia (HCC) 12/16/2022   Hypertensive heart disease with heart failure (HCC) 12/16/2022   Unspecified atrial fibrillation (HCC) 12/16/2022   (HFpEF) heart failure with preserved ejection fraction (HCC) 12/13/2022   Atrial  fibrillation with RVR (HCC) 12/06/2022   Atrial flutter (HCC) 07/22/2022   Acute kidney failure, unspecified 02/02/2022   Age-related osteoporosis without current pathological fracture 02/02/2022   Anemia in chronic kidney disease 02/02/2022   Atherosclerosis of aorta 02/02/2022   Athscl heart disease of native coronary artery w/o ang pctrs 02/02/2022   Chronic kidney disease, stage 4 (severe) (HCC) 02/02/2022   Constipation, unspecified 02/02/2022   Gastro-esophageal reflux disease without esophagitis 02/02/2022   Gout, unspecified 02/02/2022   History of falling 02/02/2022   Hyperlipidemia, unspecified 02/02/2022   Hypothyroidism, unspecified 02/02/2022   Iron deficiency anemia, unspecified 02/02/2022   Ischemic cardiomyopathy 02/02/2022   Long term (current) use of insulin  (HCC) 02/02/2022   Obstructive sleep apnea (adult) (pediatric) 02/02/2022   Vascular disorder of intestine, unspecified 02/02/2022   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/2022   Prsnl hx of TIA (TIA), and cereb infrc w/o resid deficits 02/02/2022   Old myocardial infarction 02/02/2022   Other specified heart block 02/02/2022   Personal history of other diseases of the digestive system 02/02/2022   Presence of cardiac pacemaker 02/02/2022   Presence of other cardiac implants and grafts 02/02/2022   Secondary hypercoagulable  state 04/23/2021   Constipation 03/28/2021   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   Closed left hip fracture (HCC) 03/23/2021   Mitral regurgitation severe based on TEE from 2022 April 06/10/2020   Thyroid  disease    Chronic systolic CHF (congestive heart failure) (HCC)    OSA on CPAP    Gastroesophageal reflux    Diverticulosis    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   History of cardiac monitoring 02/23/2018   History of iron  deficiency anemia 12/28/2017   Kidney disease 07/21/2017   Hypertensive disorder 07/21/2017   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/24/2017   Coronary artery disease 10/12/2016   AKI (acute kidney injury) 09/01/2016   Abnormal nuclear stress test 06/23/2016   Anemia due to stage 4 chronic kidney disease (HCC) 04/04/2015   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) 04/04/2015   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) 04/04/2015   Vitamin D  deficiency 04/04/2015   Hypercalcemia 04/04/2015   Atrial fibrillation (HCC) 09/17/2014   Dyslipidemia 09/17/2014    Past Surgical History:  Procedure Laterality Date   BIOPSY  02/15/2023   Procedure: BIOPSY;  Surgeon: Charlanne Groom, MD;  Location: [facility];  Service: Gastroenterology;;   VASSIE STUDY  05/16/2020   Procedure: BUBBLE STUDY;  Surgeon: Loni Soyla LABOR, MD;  Location: [facility] ENDOSCOPY;  Service: Cardiovascular;;   CARDIAC CATHETERIZATION     CARDIOVERSION N/A 05/16/2020   Procedure: CARDIOVERSION;  Surgeon: Loni Soyla LABOR, MD;  Location: [facility] ENDOSCOPY;  Service: Cardiovascular;  Laterality: N/A;   CARDIOVERSION N/A 09/06/2020   Procedure: CARDIOVERSION;  Surgeon: Kate Lonni CROME, MD;  Location: [facility] ENDOSCOPY;  Service: Cardiovascular;  Laterality: N/A;   CARDIOVERSION N/A 05/16/2021   Procedure: CARDIOVERSION;  Surgeon: Shlomo Wilbert SAUNDERS, MD;  Location: [facility] ENDOSCOPY;  Service: Cardiovascular;  Laterality: N/A;   CARDIOVERSION N/A 08/12/2022   Procedure: CARDIOVERSION;  Surgeon: Sheena Pugh, DO;  Location: [facility] INVASIVE CV LAB;  Service: Cardiovascular;  Laterality: N/A;   CARDIOVERSION N/A 09/30/2022   Procedure: CARDIOVERSION;  Surgeon: Lonni Slain, MD;  Location: [facility] INVASIVE CV LAB;  Service: Cardiovascular;  Laterality: N/A;   CARDIOVERSION N/A 12/08/2022   Procedure: CARDIOVERSION (CATH LAB);  Surgeon: Santo Stanly LABOR, MD;   Location: [facility] INVASIVE CV LAB;  Service: Cardiovascular;  Laterality: N/A;   CARDIOVERSION N/A 12/14/2022   Procedure: CARDIOVERSION (CATH LAB);  Surgeon: Lonni Slain, MD;  Location: [facility] INVASIVE CV LAB;  Service: Cardiovascular;  Laterality: N/A;   COLONOSCOPY  11/21/2014   Moderate predominantly sigmoid diverticulosis. Otherwise noraml collonscopy to TI.    CORONARY ANGIOPLASTY WITH STENT PLACEMENT  08/2016   ESOPHAGOGASTRODUODENOSCOPY (EGD) WITH PROPOFOL  N/A 02/15/2023   Procedure: ESOPHAGOGASTRODUODENOSCOPY (EGD) WITH PROPOFOL ;  Surgeon: Charlanne Groom, MD;  Location: [facility] ENDOSCOPY;  Service: Gastroenterology;  Laterality: N/A;   EXCISIONAL HEMORRHOIDECTOMY     LEFT ATRIAL APPENDAGE OCCLUSION  11/2016   in [place]   LOOP RECORDER INSERTION N/A 02/23/2018   Procedure: LOOP RECORDER INSERTION;  Surgeon: Inocencio Soyla Lunger, MD;  Location: [facility] INVASIVE CV LAB;  Service: Cardiovascular;  Laterality: N/A;   LOOP RECORDER REMOVAL N/A 04/24/2019   Procedure: LOOP RECORDER REMOVAL;  Surgeon: Inocencio Soyla Lunger, MD;  Location: [facility] INVASIVE CV LAB;  Service: Cardiovascular;  Laterality: N/A;   MALONEY DILATION N/A 02/15/2023   Procedure: AGAPITO DILATION;  Surgeon: Charlanne Groom, MD;  Location: [facility] ENDOSCOPY;  Service: Gastroenterology;  Laterality: N/A;   NOSE SURGERY     PACEMAKER IMPLANT N/A 04/24/2019   Procedure: PACEMAKER IMPLANT;  Surgeon: Inocencio Soyla Lunger, MD;  Location: [facility] INVASIVE CV LAB;  Service: Cardiovascular;  Laterality: N/A;   TEE WITH CARDIOVERSION     TEE WITHOUT CARDIOVERSION N/A 05/16/2020   Procedure: TRANSESOPHAGEAL ECHOCARDIOGRAM (TEE);  Surgeon: Loni Soyla LABOR, MD;  Location: [facility] ENDOSCOPY;  Service: Cardiovascular;  Laterality: N/A;   TEE WITHOUT CARDIOVERSION N/A 08/12/2022   Procedure: TRANSESOPHAGEAL ECHOCARDIOGRAM;  Surgeon: Sheena Pugh, DO;  Location: [facility] INVASIVE CV LAB;  Service: Cardiovascular;  Laterality: N/A;   TEE WITHOUT CARDIOVERSION N/A  09/30/2022   Procedure: TRANSESOPHAGEAL ECHOCARDIOGRAM;  Surgeon: Lonni Slain, MD;  Location: [facility] INVASIVE CV LAB;  Service: Cardiovascular;  Laterality: N/A;   TOTAL HIP REVISION Left 03/25/2021   Procedure: LEFT POSTERIOR TOTAL HIP  ZIMMER CABLES;  Surgeon: Ernie Cough, MD;  Location: [facility] ORS;  Service: Orthopedics;  Laterality: Left;   TRANSESOPHAGEAL ECHOCARDIOGRAM (CATH LAB) N/A 05/31/2023   Procedure: TRANSESOPHAGEAL ECHOCARDIOGRAM;  Surgeon: Francyne Headland, MD;  Location: [facility] INVASIVE CV LAB;  Service: Cardiovascular;  Laterality: N/A;    OB History   No obstetric history on file.      Home Medications    Prior to Admission medications   Medication Sig Start Date End Date Taking? Authorizing Provider  cefpodoxime (VANTIN) 100 MG tablet Take 1 tablet (100 mg total) by mouth daily. 11/24/23  Yes Yehia Mcbain K, PA-C  amLODipine  (NORVASC ) 5 MG tablet Take 1 tablet (5 mg total) by mouth daily. 08/10/23 11/08/23  Krasowski, Robert J, MD  Biotin 1000 MCG tablet Take 1,000 mcg by mouth daily.    [provider]  blood glucose meter kit and supplies KIT Dispense based on patient and insurance preference. Use up to four times daily as directed. Patient taking differently: Inject 1 each into the skin See admin instructions. Dispense based on patient and insurance preference. Use up to four times daily as directed. 03/31/21   Swayze, Ava, DO  carboxymethylcellul-glycerin (REFRESH RELIEVA) 0.5-0.9 % ophthalmic solution Place 1 drop into both eyes 4 (four) times daily as needed for dry eyes. VE    [provider]  Cholecalciferol  (VITAMIN D3) 50 MCG (2000 UT) TABS Take 2,000 Units by mouth daily at 12 noon.    [provider]  cloNIDine  (CATAPRES ) 0.1 MG tablet Take 0.1 mg by mouth 2 (two) times daily. 12/25/22   [provider]  Coenzyme Q10 100 MG capsule Take 100 mg by mouth 2 (two) times daily.    [provider]  colchicine 0.6 MG tablet  Take 0.6 mg by mouth daily as needed (Gout).    [provider]  denosumab  (PROLIA ) 60 MG/ML SOSY injection Inject 60 mg into the skin every 6 (six) months.    [provider]  docusate sodium  (COLACE) 100 MG capsule Take 100 mg by mouth in the morning.    [provider]  estradiol  (ESTRACE ) 0.1 MG/GM vaginal cream Place 1 Applicatorful vaginally every Monday, Wednesday, and Friday.     [provider]  Evolocumab  (REPATHA  SURECLICK) 140 MG/ML SOAJ Inject 140 mg into the skin every 14 (fourteen) days. 06/10/23   Krasowski, Robert J, MD  famotidine  (PEPCID ) 40 MG tablet Take 40 mg by mouth in the morning. 03/10/23   [provider]  febuxostat  (ULORIC ) 40 MG tablet Take 40 mg by mouth every evening. 05/06/23   [provider]  ferrous sulfate  325 (65  FE) MG EC tablet Take 325 mg by mouth 2 (two) times daily with breakfast and lunch. 12/28/17   [provider]  folic acid  (FOLVITE ) 800 MCG tablet Take 800 mcg by mouth daily at 12 noon.    [provider]  Glucosamine-Chondroitin (OSTEO BI-FLEX REGULAR STRENGTH PO) Take 25 mcg by mouth daily at 12 noon. Plus D3    [provider]  HUMALOG KWIKPEN 100 UNIT/ML KwikPen Inject 2-6 Units into the skin See admin instructions. Per sliding scale 71-150=0 151-200=2 201-250=4 251-300=6 301-350=8 351-400=10 Check level in 2 hours and if no results call MD 12/25/22   [provider]  hydrALAZINE  (APRESOLINE ) 50 MG tablet Take 1 tablet (50 mg total) by mouth every 6 (six) hours. 12/08/22   Krasowski, Robert J, MD  isosorbide  mononitrate (IMDUR ) 60 MG 24 hr tablet Take 60 mg by mouth daily.    [provider]  Lactobacillus (FLORAJEN ACIDOPHILUS) CAPS Take 1 capsule by mouth daily at 12 noon. 390 mg each    [provider]  levothyroxine  (SYNTHROID , LEVOTHROID) 50 MCG tablet Take 50 mcg by mouth daily before breakfast.    [provider]  metoprolol   succinate (TOPROL -XL) 50 MG 24 hr tablet Take 1.5 tablets (75 mg total) by mouth daily. Take with or immediately following a meal. 08/10/23 11/08/23  Krasowski, Robert J, MD  Multiple Vitamins-Minerals (PRESERVISION AREDS 2) CAPS Take 1 capsule by mouth 2 (two) times daily.    [provider]  nitroGLYCERIN  (NITROSTAT ) 0.4 MG SL tablet Place 1 tablet (0.4 mg total) under the tongue every 5 (five) minutes as needed for chest pain. 08/10/23   Krasowski, Robert J, MD  Nutritional Supplements (GLUCERNA MEAL PO) Take 237 mLs by mouth daily.    [provider]  omega-3 acid ethyl esters (LOVAZA ) 1 g capsule TAKE 2 CAPSULES BY MOUTH DAILY. 06/03/23   Krasowski, Robert J, MD  pantoprazole  (PROTONIX ) 40 MG tablet Take 1 tablet (40 mg total) by mouth daily. 10/14/21   Krasowski, Robert J, MD  Polyethylene Glycol 3350  (MIRALAX  PO) Take 1 Capful by mouth daily as needed (Constipation).    [provider]  pravastatin  (PRAVACHOL ) 40 MG tablet Take 40 mg by mouth every other day.    [provider]  ranolazine  (RANEXA ) 500 MG 12 hr tablet Take 1 tablet (500 mg total) by mouth 2 (two) times daily. 04/26/23   Krasowski, Robert J, MD  torsemide  (DEMADEX ) 20 MG tablet Take 20 mg by mouth 2 (two) times daily. 12/25/22   [provider]  vitamin B-12 (CYANOCOBALAMIN ) 1000 MCG tablet Take 1,000 mcg by mouth in the morning and at bedtime.    [provider]  Wheat Dextrin (BENEFIBER PO) Take 3 tablets by mouth daily.    [provider]    Family History Family History  Problem Relation Age of Onset   Breast cancer Mother    Hypertension Father    Prostate cancer Father    Stroke Father    Diabetes Sister    Heart attack Sister    Heart attack Brother    Cancer Maternal Grandfather        not sure if it is colon or rectal    Heart attack Maternal Uncle    Rectal cancer Maternal Uncle    Stroke Maternal Uncle    Esophageal cancer Neg Hx    Pancreatic cancer  Neg Hx    Stomach cancer Neg Hx     Social History Social  History   Tobacco Use   Smoking status: Former   Smokeless tobacco: Former    Quit date: 1997   Tobacco comments:    Former smoker 05/22/21  Vaping Use   Vaping status: Never Used  Substance Use Topics   Alcohol use: Yes    Alcohol/week: 2.0 standard drinks of alcohol    Types: 2 Standard drinks or equivalent per week    Comment: Occ   Drug use: No     Allergies   Ciprofloxacin, Contrast media [iodinated contrast media], Crestor [rosuvastatin], Lipitor [atorvastatin], Nsaids, Statins, and Sulfa antibiotics   Review of Systems Review of Systems  Constitutional:  Positive for activity change. Negative for appetite change, fatigue and fever.  Gastrointestinal:  Negative for abdominal pain, diarrhea, nausea and vomiting.  Genitourinary:  Positive for dysuria, frequency and urgency. Negative for flank pain, genital sores, hematuria, vaginal bleeding, vaginal discharge and vaginal pain.  Musculoskeletal:  Negative for arthralgias and myalgias.     Physical Exam Triage Vital Signs ED Triage Vitals  Encounter Vitals Group     BP 11/24/23 1503 (!) 146/85     Girls Systolic BP Percentile --      Girls Diastolic BP Percentile --      Boys Systolic BP Percentile --      Boys Diastolic BP Percentile --      Pulse Rate 11/24/23 1503 85     Resp 11/24/23 1503 20     Temp 11/24/23 1503 (!) 97.4 F (36.3 C)     Temp Source 11/24/23 1503 Oral     SpO2 11/24/23 1503 98 %     Weight --      Height --      Head Circumference --      Peak Flow --      Pain Score 11/24/23 1501 8     Pain Loc --      Pain Education --      Exclude from Growth Chart --    No data found.  Updated Vital Signs BP (!) 146/85 (BP Location: Left Arm)   Pulse 85   Temp (!) 97.4 F (36.3 C) (Oral)   Resp 20   SpO2 98%   Visual Acuity Right Eye Distance:   Left Eye Distance:   Bilateral Distance:    Right Eye Near:   Left Eye Near:     Bilateral Near:     Physical Exam Vitals reviewed.  Constitutional:      General: She is awake. She is not in acute distress.    Appearance: Normal appearance. She is well-developed. She is not ill-appearing.     Comments: Very pleasant female appears stated age in no acute distress sitting comfortably in exam room  HENT:     Head: Normocephalic and atraumatic.  Cardiovascular:     Rate and Rhythm: Normal rate. Rhythm irregularly irregular.     Heart sounds: Normal heart sounds, S1 normal and S2 normal. No murmur heard. Pulmonary:     Effort: Pulmonary effort is normal.     Breath sounds: Normal breath sounds. No wheezing, rhonchi or rales.     Comments: Clear to auscultation bilaterally Abdominal:     General: Bowel sounds are normal.     Palpations: Abdomen is soft.     Tenderness: There is no abdominal tenderness. There is no right CVA tenderness, left CVA tenderness, guarding or rebound.     Comments: Benign abdominal exam  Psychiatric:  Behavior: Behavior is cooperative.      UC Treatments / Results  Labs (all labs ordered are listed, but only abnormal results are displayed) Labs Reviewed  POCT URINE DIPSTICK - Abnormal; Notable for the following components:      Result Value   Clarity, UA cloudy (*)    Blood, UA moderate (*)    Protein Ur, POC =100 (*)    Leukocytes, UA Small (1+) (*)    All other components within normal limits  URINE CULTURE    EKG   Radiology No results found.  Procedures Procedures (including critical care time)  Medications Ordered in UC Medications - No data to display  Initial Impression / Assessment and Plan / UC Course  I have reviewed the triage vital signs and the nursing notes.  Pertinent labs & imaging results that were available during my care of the patient were reviewed by me and considered in my medical decision making (see chart for details).     UA and symptoms concerning for UTI.  Patient is  well-appearing, afebrile, nontoxic, nontachycardic with no indication for emergent evaluation or imaging based on vital signs and physical exam today.  She was started on cefpodoxime with renally adjusted dose; last metabolic panel obtained 10/20/2023 showed creatinine of 3.1 and calculated creatinine clearance of 16 mL minute so she was started on 100 mg daily for 7 days.  Will send this for culture and contact her if we need to stop or change her antibiotics.  I also recommend that she follow-up with her primary care/urologist for repeat UA to ensure clearing of infection.  She does have a history of A-fib but is not currently on anticoagulation as she had Watchman device placed.  Recommend close follow-up with her urologist.  If she has any worsening or changing symptoms including abdominal pain, fever, nausea/vomiting, urinary retention, weakness she needs to be seen emergently.  Strict return precautions given.    Final Clinical Impressions(s) / UC Diagnoses   Final diagnoses:  Acute cystitis with hematuria     Discharge Instructions      Take cefpodoxime daily for 1 week.  Make sure that you are drinking plenty of fluid.  I will contact you if we need to stop or change your antibiotics based on culture results.  If you have any fever, abdominal pain, difficulty urinating, nausea/vomiting, weakness you need to go to the emergency room.  Please follow-up with your primary care and/or urologist within the week.     ED Prescriptions     Medication Sig Dispense Auth. Provider   cefpodoxime (VANTIN) 100 MG tablet Take 1 tablet (100 mg total) by mouth daily. 7 tablet Trinaty Bundrick K, PA-C   cefpodoxime (VANTIN) 100 MG tablet Take 1 tablet (100 mg total) by mouth daily. 7 tablet Sindia Kowalczyk K, PA-C      PDMP not reviewed this encounter.   Sherrell Rocky POUR, PA-C 11/24/23 1545

## 2023-11-24 NOTE — Discharge Instructions (Signed)
 Take cefpodoxime daily for 1 week.  Make sure that you are drinking plenty of fluid.  I will contact you if we need to stop or change your antibiotics based on culture results.  If you have any fever, abdominal pain, difficulty urinating, nausea/vomiting, weakness you need to go to the emergency room.  Please follow-up with your primary care and/or urologist within the week.

## 2023-11-24 NOTE — ED Triage Notes (Signed)
 Pt c/o dysuria, frequency, and urgency for the last 2 days. Pt denies hematuria, pelvic pain, and low back pain. She has not taken anything for her symptoms.

## 2023-11-26 ENCOUNTER — Ambulatory Visit (HOSPITAL_COMMUNITY): Payer: Self-pay

## 2023-11-26 LAB — URINE CULTURE: Culture: >=100000 — AB

## 2023-12-14 DIAGNOSIS — N185 Chronic kidney disease, stage 5: Secondary | ICD-10-CM | POA: Diagnosis not present

## 2023-12-14 DIAGNOSIS — E1122 Type 2 diabetes mellitus with diabetic chronic kidney disease: Secondary | ICD-10-CM | POA: Diagnosis not present

## 2023-12-14 DIAGNOSIS — Z79899 Other long term (current) drug therapy: Secondary | ICD-10-CM | POA: Diagnosis not present

## 2023-12-14 DIAGNOSIS — Z0000 Encounter for general adult medical examination without abnormal findings: Secondary | ICD-10-CM | POA: Diagnosis not present

## 2023-12-14 DIAGNOSIS — E785 Hyperlipidemia, unspecified: Secondary | ICD-10-CM | POA: Diagnosis not present

## 2023-12-14 DIAGNOSIS — Z23 Encounter for immunization: Secondary | ICD-10-CM | POA: Diagnosis not present

## 2023-12-14 DIAGNOSIS — E039 Hypothyroidism, unspecified: Secondary | ICD-10-CM | POA: Diagnosis not present

## 2023-12-14 DIAGNOSIS — Z1339 Encounter for screening examination for other mental health and behavioral disorders: Secondary | ICD-10-CM | POA: Diagnosis not present

## 2023-12-20 ENCOUNTER — Other Ambulatory Visit: Payer: Self-pay | Admitting: Cardiology

## 2023-12-27 ENCOUNTER — Ambulatory Visit: Attending: Cardiology | Admitting: Cardiology

## 2023-12-27 ENCOUNTER — Encounter: Payer: Self-pay | Admitting: Cardiology

## 2023-12-27 VITALS — BP 130/84 | HR 100 | Ht 63.0 in | Wt 163.0 lb

## 2023-12-27 DIAGNOSIS — I340 Nonrheumatic mitral (valve) insufficiency: Secondary | ICD-10-CM

## 2023-12-27 DIAGNOSIS — J9601 Acute respiratory failure with hypoxia: Secondary | ICD-10-CM | POA: Diagnosis not present

## 2023-12-27 DIAGNOSIS — Z95811 Presence of heart assist device: Secondary | ICD-10-CM

## 2023-12-27 DIAGNOSIS — E1165 Type 2 diabetes mellitus with hyperglycemia: Secondary | ICD-10-CM | POA: Diagnosis not present

## 2023-12-27 DIAGNOSIS — I255 Ischemic cardiomyopathy: Secondary | ICD-10-CM

## 2023-12-27 DIAGNOSIS — Z794 Long term (current) use of insulin: Secondary | ICD-10-CM

## 2023-12-27 NOTE — Patient Instructions (Signed)
Medication Instructions:  Your physician recommends that you continue on your current medications as directed. Please refer to the Current Medication list given to you today.  *If you need a refill on your cardiac medications before your next appointment, please call your pharmacy*   Lab Work: None ordered If you have labs (blood work) drawn today and your tests are completely normal, you will receive your results only by: [place] (if you have MyChart) OR A paper copy in the mail If you have any lab test that is abnormal or we need to change your treatment, we will call you to review the results.   Testing/Procedures: None ordered   Follow-Up: At [facility], you and your health needs are our priority.  As part of our continuing mission to provide you with exceptional heart care, we have created designated Provider Care Teams.  These Care Teams include your primary Cardiologist (physician) and Advanced Practice Providers (APPs -  Physician Assistants and Nurse Practitioners) who all work together to provide you with the care you need, when you need it.  We recommend signing up for the patient portal called "MyChart".  Sign up information is provided on this After Visit Summary.  MyChart is used to connect with patients for Virtual Visits (Telemedicine).  Patients are able to view lab/test results, encounter notes, upcoming appointments, etc.  Non-urgent messages can be sent to your provider as well.   To learn more about what you can do with MyChart, go to [email or web address].    Your next appointment:   5 month(s)  The format for your next appointment:   In Person  Provider:   Jenne Campus, MD    Other Instructions none  Important Information About Sugar

## 2023-12-27 NOTE — Progress Notes (Signed)
 Cardiology Office Note:    Date:  12/27/2023   ID:  [name], DOB 1940-12-12, MRN [id number]  PCP:  Street, Lonni HERO, MD  Cardiologist:  Lamar Fitch, MD    Referring MD: Street, Lonni HERO, *   Chief Complaint  Patient presents with   Follow-up  Doing fine  History of Present Illness:    Amanda [name] is a 83 y.o. female past medical history significant for atrial fibrillation and atrial flutter status post Watchman device secondary to GI bleed, not anticoagulated, chronic kidney failure, conversation with dialysis initiated, remote coronary artery disease, complete heart block with pacemaker, obstructive sleep apnea, diabetes, dyslipidemia, moderate to severe mitral valve regurgitation.  She was evaluated for her mitral regurgitation by our structural team she was disqualified as to potentially too high risks.  Comes today to months for follow-up we will doing fine looking forward to her Thanksgiving.  Denies have any chest pain tightness squeezing pressure in chest no palpitations overall doing well  Past Medical History:  Diagnosis Date   (HFpEF) heart failure with preserved ejection fraction (HCC) 12/13/2022   Abnormal nuclear stress test 06/23/2016   Overview:   Added automatically from request for surgery [id number]     Acute ischemic stroke (HCC)    Acute kidney failure, unspecified 02/02/2022   Acute metabolic encephalopathy    Acute on chronic combined systolic and diastolic hrt fail (HCC) 12/16/2022   Acute on chronic systolic (congestive) heart failure (HCC)    Acute renal insufficiency 09/02/2016   Acute respiratory failure with hypoxia (HCC) 12/16/2022   Age-related osteoporosis without current pathological fracture 02/02/2022   AKI (acute kidney injury) 09/01/2016   Anemia due to stage 4 chronic kidney disease (HCC) 04/04/2015   Anemia in chronic kidney disease 02/02/2022   Atherosclerosis of aorta 02/02/2022   Athscl heart disease of native  coronary artery w/o ang pctrs 02/02/2022   Atrial fibrillation (HCC)    Atrial fibrillation with RVR (HCC) 12/06/2022   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) 04/04/2015   Bradycardia 09/17/2014   Chest pain 06/23/2016   Overview:  Added automatically from request for surgery [id number]   Chronic anemia    Chronic kidney disease (CKD), stage III (moderate) (HCC)    Chronic kidney disease, stage 4 (severe) (HCC) 02/02/2022   Chronic systolic CHF (congestive heart failure) (HCC)    Closed left hip fracture (HCC) 03/23/2021   Constipation 03/28/2021   Constipation, unspecified 02/02/2022   Coronary artery disease 10/12/2016   Non drug-eluting stent implanted in June 2018 to mid RCA   08/08/2016 10:37  Angiographic Findings  Cardiac Arteries and Lesion Findings LMCA: 0% and Normal. LAD: 0% and Normal. RCA:   Lesion on Mid RCA: Mid subsection.95% stenosis reduced to 0%. Pre procedure   TIMI II flow was noted. Post Procedure TIMI III flow was present. Poor run   off was present. The lesion was diagnosed as High Risk (C).    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) 04/04/2015   Diabetes mellitus without complication (HCC)    Diverticulosis    Dyslipidemia 09/17/2014   Gastro-esophageal reflux disease without esophagitis 02/02/2022   Gastroesophageal reflux    GI bleed 08/06/2016   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   History of cardiac monitoring 02/23/2018   monitor inserted   History of falling 02/02/2022   History of iron deficiency anemia 12/28/2017   History of transesophageal echocardiography (TEE)  Hypercalcemia 04/04/2015   Hyperlipidemia    Hyperlipidemia, unspecified 02/02/2022   Hypertensive disorder 07/21/2017   Hypertensive heart disease with heart failure (HCC)    Hypothyroidism, unspecified 02/02/2022   Iron deficiency anemia 12/28/2017   Iron deficiency anemia, unspecified 02/02/2022   Ischemic  cardiomyopathy 02/02/2022   Kidney disease 07/21/2017   Long term (current) use of insulin  (HCC) 02/02/2022   LV dysfunction 09/02/2016   Mitral regurgitation severe based on TEE from 2022 April 06/10/2020   Myocardial infarction [facility])    NSTEMI (non-ST elevated myocardial infarction) (HCC) 08/06/2016   Obstructive sleep apnea 09/17/2014   Obstructive sleep apnea (adult) (pediatric) 02/02/2022   Old myocardial infarction 02/02/2022   Orthostatic hypotension 12/28/2017   OSA on CPAP    Other specified heart block 02/02/2022   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   Paroxysmal atrial flutter (HCC) 07/22/2022   Personal history of other diseases of the digestive system 02/02/2022   Postural dizziness with presyncope 09/23/2017   Presence of cardiac pacemaker 02/02/2022   Presence of other cardiac implants and grafts 02/02/2022   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/24/2017   Prsnl hx of TIA (TIA), and cereb infrc w/o resid deficits 02/02/2022   Recurrent left pleural effusion 03/14/2015   Renal failure, chronic, stage 3 (moderate) (HCC)    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/2022   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   Secondary hypercoagulable state 04/23/2021   Syncope 11/04/2017   Systolic heart failure (HCC)    Thyroid  disease    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   Unspecified atrial fibrillation (HCC) 12/16/2022   Vascular disorder of intestine, unspecified 02/02/2022   Vitamin D  deficiency 04/04/2015    Past Surgical History:  Procedure Laterality Date   BIOPSY  02/15/2023   Procedure: BIOPSY;  Surgeon: Charlanne Groom, MD;  Location: [facility];  Service: Gastroenterology;;   VASSIE STUDY  05/16/2020   Procedure: BUBBLE STUDY;  Surgeon: Loni Soyla LABOR, MD;  Location: [facility] ENDOSCOPY;  Service: Cardiovascular;;   CARDIAC CATHETERIZATION     CARDIOVERSION N/A 05/16/2020    Procedure: CARDIOVERSION;  Surgeon: Loni Soyla LABOR, MD;  Location: [facility] ENDOSCOPY;  Service: Cardiovascular;  Laterality: N/A;   CARDIOVERSION N/A 09/06/2020   Procedure: CARDIOVERSION;  Surgeon: Kate Lonni CROME, MD;  Location: [facility] ENDOSCOPY;  Service: Cardiovascular;  Laterality: N/A;   CARDIOVERSION N/A 05/16/2021   Procedure: CARDIOVERSION;  Surgeon: Shlomo Wilbert SAUNDERS, MD;  Location: [facility] ENDOSCOPY;  Service: Cardiovascular;  Laterality: N/A;   CARDIOVERSION N/A 08/12/2022   Procedure: CARDIOVERSION;  Surgeon: Sheena Pugh, DO;  Location: [facility] INVASIVE CV LAB;  Service: Cardiovascular;  Laterality: N/A;   CARDIOVERSION N/A 09/30/2022   Procedure: CARDIOVERSION;  Surgeon: Lonni Slain, MD;  Location: [facility] INVASIVE CV LAB;  Service: Cardiovascular;  Laterality: N/A;   CARDIOVERSION N/A 12/08/2022   Procedure: CARDIOVERSION (CATH LAB);  Surgeon: Santo Stanly LABOR, MD;  Location: [facility] INVASIVE CV LAB;  Service: Cardiovascular;  Laterality: N/A;   CARDIOVERSION N/A 12/14/2022   Procedure: CARDIOVERSION (CATH LAB);  Surgeon: Lonni Slain, MD;  Location: [facility] INVASIVE CV LAB;  Service: Cardiovascular;  Laterality: N/A;   COLONOSCOPY  11/21/2014   Moderate predominantly sigmoid diverticulosis. Otherwise noraml collonscopy to TI.    CORONARY ANGIOPLASTY WITH STENT PLACEMENT  08/2016   ESOPHAGOGASTRODUODENOSCOPY (EGD) WITH PROPOFOL  N/A 02/15/2023   Procedure: ESOPHAGOGASTRODUODENOSCOPY (EGD) WITH PROPOFOL ;  Surgeon: Charlanne Groom, MD;  Location: [facility] ENDOSCOPY;  Service: Gastroenterology;  Laterality: N/A;   EXCISIONAL HEMORRHOIDECTOMY     LEFT ATRIAL APPENDAGE OCCLUSION  11/2016   in [place]   LOOP RECORDER INSERTION N/A 02/23/2018   Procedure: LOOP RECORDER INSERTION;  Surgeon: Inocencio Soyla Lunger, MD;  Location: [facility] INVASIVE CV LAB;  Service: Cardiovascular;  Laterality: N/A;   LOOP RECORDER REMOVAL N/A 04/24/2019   Procedure: LOOP RECORDER REMOVAL;  Surgeon: Inocencio Soyla Lunger, MD;  Location: [facility] INVASIVE CV LAB;  Service: Cardiovascular;  Laterality: N/A;   MALONEY DILATION N/A 02/15/2023   Procedure: AGAPITO DILATION;  Surgeon: Charlanne Groom, MD;  Location: [facility] ENDOSCOPY;  Service: Gastroenterology;  Laterality: N/A;   NOSE SURGERY     PACEMAKER IMPLANT N/A 04/24/2019   Procedure: PACEMAKER IMPLANT;  Surgeon: Inocencio Soyla Lunger, MD;  Location: [facility] INVASIVE CV LAB;  Service: Cardiovascular;  Laterality: N/A;   TEE WITH CARDIOVERSION     TEE WITHOUT CARDIOVERSION N/A 05/16/2020   Procedure: TRANSESOPHAGEAL ECHOCARDIOGRAM (TEE);  Surgeon: Loni Soyla LABOR, MD;  Location[facility];  Service: Cardiovascular;  Laterality: N/A;   TEE WITHOUT CARDIOVERSION N/A 08/12/2022   Procedure: TRANSESOPHAGEAL ECHOCARDIOGRAM;  Surgeon: Sheena Pugh, DO;  Location: [facility] INVASIVE CV LAB;  Service: Cardiovascular;  Laterality: N/A;   TEE WITHOUT CARDIOVERSION N/A 09/30/2022   Procedure: TRANSESOPHAGEAL ECHOCARDIOGRAM;  Surgeon: Lonni Slain, MD;  Location: [facility] INVASIVE CV LAB;  Service: Cardiovascular;  Laterality: N/A;   TOTAL HIP REVISION Left 03/25/2021   Procedure: LEFT POSTERIOR TOTAL HIP  ZIMMER CABLES;  Surgeon: Ernie Cough, MD;  Location: [facility] ORS;  Service: Orthopedics;  Laterality: Left;   TRANSESOPHAGEAL ECHOCARDIOGRAM (CATH LAB) N/A 05/31/2023   Procedure: TRANSESOPHAGEAL ECHOCARDIOGRAM;  Surgeon: Francyne Headland, MD;  Location: [facility] INVASIVE CV LAB;  Service: Cardiovascular;  Laterality: N/A;    Current Medications: Current Meds  Medication Sig   amLODipine  (NORVASC ) 5 MG tablet Take 1 tablet (5 mg total) by mouth daily.   Biotin 1000 MCG tablet Take 1,000 mcg by mouth daily.   blood glucose meter kit and supplies KIT Dispense based on patient and insurance preference. Use up to four times daily as directed. (Patient taking differently: Inject 1 each into the skin See admin instructions. Dispense based on patient and insurance preference. Use up to four  times daily as directed.)   carboxymethylcellul-glycerin (REFRESH RELIEVA) 0.5-0.9 % ophthalmic solution Place 1 drop into both eyes 4 (four) times daily as needed for dry eyes. VE   Cholecalciferol  (VITAMIN D3) 50 MCG (2000 UT) TABS Take 2,000 Units by mouth daily at 12 noon.   cloNIDine  (CATAPRES ) 0.1 MG tablet Take 0.1 mg by mouth 2 (two) times daily.   Coenzyme Q10 100 MG capsule Take 100 mg by mouth 2 (two) times daily.   colchicine 0.6 MG tablet Take 0.6 mg by mouth daily as needed (Gout).   denosumab  (PROLIA ) 60 MG/ML SOSY injection Inject 60 mg into the skin every 6 (six) months.   docusate sodium  (COLACE) 100 MG capsule Take 100 mg by mouth in the morning.   estradiol  (ESTRACE ) 0.1 MG/GM vaginal cream Place 1 Applicatorful vaginally every Monday, Wednesday, and Friday.    Evolocumab  (REPATHA  SURECLICK) 140 MG/ML SOAJ Inject 140 mg into the skin every 14 (fourteen) days.   famotidine  (PEPCID ) 40 MG tablet Take 40 mg by mouth in the morning.   febuxostat  (ULORIC ) 40 MG tablet Take 40 mg by mouth every evening.   ferrous sulfate  325 (65 FE) MG EC tablet Take 325 mg by mouth 2 (two) times daily  with breakfast and lunch.   folic acid  (FOLVITE ) 800 MCG tablet Take 800 mcg by mouth daily at 12 noon.   Glucosamine-Chondroitin (OSTEO BI-FLEX REGULAR STRENGTH PO) Take 25 mcg by mouth daily at 12 noon. Plus D3   HUMALOG KWIKPEN 100 UNIT/ML KwikPen Inject 2-6 Units into the skin See admin instructions. Per sliding scale 71-150=0 151-200=2 201-250=4 251-300=6 301-350=8 351-400=10 Check level in 2 hours and if no results call MD   hydrALAZINE  (APRESOLINE ) 50 MG tablet TAKE 1 TABLET BY MOUTH EVERY SIX HOURS.   isosorbide  mononitrate (IMDUR ) 60 MG 24 hr tablet Take 60 mg by mouth daily.   Lactobacillus (FLORAJEN ACIDOPHILUS) CAPS Take 1 capsule by mouth daily at 12 noon. 390 mg each   levothyroxine  (SYNTHROID , LEVOTHROID) 50 MCG tablet Take 50 mcg by mouth daily before breakfast.   metoprolol   succinate (TOPROL -XL) 50 MG 24 hr tablet Take 1.5 tablets (75 mg total) by mouth daily. Take with or immediately following a meal.   Multiple Vitamins-Minerals (PRESERVISION AREDS 2) CAPS Take 1 capsule by mouth 2 (two) times daily.   nitroGLYCERIN  (NITROSTAT ) 0.4 MG SL tablet Place 1 tablet (0.4 mg total) under the tongue every 5 (five) minutes as needed for chest pain.   Nutritional Supplements (GLUCERNA MEAL PO) Take 237 mLs by mouth daily.   omega-3 acid ethyl esters (LOVAZA ) 1 g capsule TAKE 2 CAPSULES BY MOUTH DAILY.   pantoprazole  (PROTONIX ) 40 MG tablet Take 1 tablet (40 mg total) by mouth daily.   Polyethylene Glycol 3350  (MIRALAX  PO) Take 1 Capful by mouth daily as needed (Constipation).   pravastatin  (PRAVACHOL ) 40 MG tablet Take 40 mg by mouth every other day.   ranolazine  (RANEXA ) 500 MG 12 hr tablet Take 1 tablet (500 mg total) by mouth 2 (two) times daily.   torsemide  (DEMADEX ) 20 MG tablet Take 20 mg by mouth 2 (two) times daily.   vitamin B-12 (CYANOCOBALAMIN ) 1000 MCG tablet Take 1,000 mcg by mouth in the morning and at bedtime.   Wheat Dextrin (BENEFIBER PO) Take 3 tablets by mouth daily.     Allergies:   Ciprofloxacin, Contrast media [iodinated contrast media], Crestor [rosuvastatin], Lipitor [atorvastatin], Nsaids, Statins, and Sulfa antibiotics   Social History   Socioeconomic History   Marital status: Married    Spouse name: Not on file   Number of children: 2   Years of education: Not on file   Highest education level: Not on file  Occupational History   Occupation: Retired   Tobacco Use   Smoking status: Former   Smokeless tobacco: Former    Quit date: 1997   Tobacco comments:    Former smoker 05/22/21  Vaping Use   Vaping status: Never Used  Substance and Sexual Activity   Alcohol use: Yes    Alcohol/week: 2.0 standard drinks of alcohol    Types: 2 Standard drinks or equivalent per week    Comment: Occ   Drug use: No   Sexual activity: Not on file   Other Topics Concern   Not on file  Social History Narrative   Watchman device 2018   Social Drivers of Health   Financial Resource Strain: Not on file  Food Insecurity: No Food Insecurity (12/07/2022)   Hunger Vital Sign    Worried About Running Out of Food in the Last Year: Never true    Ran Out of Food in the Last Year: Never true  Transportation Needs: No Transportation Needs (12/07/2022)   PRAPARE - Transportation  Lack of Transportation (Medical): No    Lack of Transportation (Non-Medical): No  Physical Activity: Not on file  Stress: Not on file  Social Connections: Not on file     Family History: The patient's family history includes Breast cancer in her mother; Cancer in her maternal grandfather; Diabetes in her sister; Heart attack in her brother, maternal uncle, and sister; Hypertension in her father; Prostate cancer in her father; Rectal cancer in her maternal uncle; Stroke in her father and maternal uncle. There is no history of Esophageal cancer, Pancreatic cancer, or Stomach cancer. ROS:   Please see the history of present illness.    All 14 point review of systems negative except as described per history of present illness  EKGs/Labs/Other Studies Reviewed:         Recent Labs: 01/18/2023: ALT 27 03/12/2023: Platelets 196 05/31/2023: BUN 78; Creatinine, Ser 3.80; Hemoglobin 12.6; Potassium 4.2; Sodium 139  Recent Lipid Panel    Component Value Date/Time   CHOL 125 09/05/2019 0842   TRIG 162 (H) 09/05/2019 0842   HDL 59 09/05/2019 0842   CHOLHDL 2.1 09/05/2019 0842   LDLCALC 39 09/05/2019 0842    Physical Exam:    VS:  BP 130/84   Pulse 100   Ht 5' 3 (1.6 m)   Wt 163 lb (73.9 kg)   SpO2 99%   BMI 28.87 kg/m     Wt Readings from Last 3 Encounters:  12/27/23 163 lb (73.9 kg)  11/08/23 158 lb (71.7 kg)  08/10/23 161 lb (73 kg)     GEN:  Well nourished, well developed in no acute distress HEENT: Normal NECK: No JVD; No carotid  bruits LYMPHATICS: No lymphadenopathy CARDIAC: Irregularly irregular, holosystolic murmur best heard left border sternum 2/6, no rubs, no gallops RESPIRATORY:  Clear to auscultation without rales, wheezing or rhonchi  ABDOMEN: Soft, non-tender, non-distended MUSCULOSKELETAL:  No edema; No deformity  SKIN: Warm and dry LOWER EXTREMITIES: no swelling NEUROLOGIC:  Alert and oriented x 3 PSYCHIATRIC:  Normal affect   ASSESSMENT:    1. Nonrheumatic mitral valve regurgitation   2. Acute respiratory failure with hypoxia (HCC)   3. Type 2 diabetes mellitus with hyperglycemia, with long-term current use of insulin  (HCC)   4. Presence of heart assist device (HCC)   5. Ischemic cardiomyopathy    PLAN:    In order of problems listed above:  Nonrheumatic mitral valve regurgitation for dynamically compensated will continue monitoring, not a candidate for open heart surgery, disqualifies potential candidate for mitral valve clip. Type 2 diabetes followed by internal medicine stable. History of ischemic cardiomyopathy will schedule to have echocardiogram at the beginning of next year. Atrial fibrillation, Watchman device present.   Medication Adjustments/Labs and Tests Ordered: Current medicines are reviewed at length with the patient today.  Concerns regarding medicines are outlined above.  No orders of the defined types were placed in this encounter.  Medication changes: No orders of the defined types were placed in this encounter.   Signed, Lamar DOROTHA Fitch, MD, [facility] 12/27/2023 11:47 AM    [facility]

## 2024-01-17 ENCOUNTER — Ambulatory Visit: Payer: Medicare Other

## 2024-01-17 ENCOUNTER — Other Ambulatory Visit: Payer: Self-pay | Admitting: Cardiology

## 2024-01-17 DIAGNOSIS — I255 Ischemic cardiomyopathy: Secondary | ICD-10-CM

## 2024-01-19 ENCOUNTER — Ambulatory Visit: Payer: Self-pay | Admitting: Cardiology

## 2024-01-19 LAB — CUP PACEART REMOTE DEVICE CHECK
Battery Remaining Longevity: 118 mo
Battery Voltage: 3 V
Brady Statistic RA Percent Paced: 0.04 %
Brady Statistic RV Percent Paced: 50.46 %
Date Time Interrogation Session: 20251214190606
Implantable Lead Connection Status: 753985
Implantable Lead Connection Status: 753985
Implantable Lead Implant Date: 20210322
Implantable Lead Implant Date: 20210322
Implantable Lead Location: 753859
Implantable Lead Location: 753860
Implantable Lead Model: 5076
Implantable Lead Model: 5076
Implantable Pulse Generator Implant Date: 20210322
Lead Channel Impedance Value: 266 Ohm
Lead Channel Impedance Value: 285 Ohm
Lead Channel Impedance Value: 342 Ohm
Lead Channel Impedance Value: 418 Ohm
Lead Channel Pacing Threshold Amplitude: 0.625 V
Lead Channel Pacing Threshold Amplitude: 0.75 V
Lead Channel Pacing Threshold Pulse Width: 0.4 ms
Lead Channel Pacing Threshold Pulse Width: 0.4 ms
Lead Channel Sensing Intrinsic Amplitude: 0.875 mV
Lead Channel Sensing Intrinsic Amplitude: 0.875 mV
Lead Channel Sensing Intrinsic Amplitude: 7.75 mV
Lead Channel Sensing Intrinsic Amplitude: 7.75 mV
Lead Channel Setting Pacing Amplitude: 1.5 V
Lead Channel Setting Pacing Amplitude: 2.5 V
Lead Channel Setting Pacing Pulse Width: 0.4 ms
Lead Channel Setting Sensing Sensitivity: 2 mV
Zone Setting Status: 755011
Zone Setting Status: 755011

## 2024-01-21 NOTE — Progress Notes (Signed)
 Remote PPM Transmission

## 2024-02-12 ENCOUNTER — Encounter (HOSPITAL_BASED_OUTPATIENT_CLINIC_OR_DEPARTMENT_OTHER): Payer: Self-pay

## 2024-02-12 ENCOUNTER — Ambulatory Visit (HOSPITAL_BASED_OUTPATIENT_CLINIC_OR_DEPARTMENT_OTHER)
Admission: RE | Admit: 2024-02-12 | Discharge: 2024-02-12 | Disposition: A | Discharge status: Home / Self Care | Attending: Family Medicine | Admitting: Family Medicine

## 2024-02-12 VITALS — BP 152/87 | HR 81 | Temp 97.7°F | Resp 18

## 2024-02-12 DIAGNOSIS — R319 Hematuria, unspecified: Secondary | ICD-10-CM | POA: Insufficient documentation

## 2024-02-12 DIAGNOSIS — N390 Urinary tract infection, site not specified: Secondary | ICD-10-CM | POA: Diagnosis not present

## 2024-02-12 DIAGNOSIS — R300 Dysuria: Secondary | ICD-10-CM | POA: Diagnosis not present

## 2024-02-12 DIAGNOSIS — R350 Frequency of micturition: Secondary | ICD-10-CM | POA: Diagnosis present

## 2024-02-12 LAB — POCT URINE DIPSTICK
Bilirubin, UA: NEGATIVE
Glucose, UA: NEGATIVE mg/dL
Ketones, POC UA: NEGATIVE mg/dL
Nitrite, UA: POSITIVE — AB
Protein Ur, POC: >=300 mg/dL — AB
Spec Grav, UA: 1.02
Urobilinogen, UA: 0.2 U/dL
pH, UA: 5.5

## 2024-02-12 MED ORDER — NITROFURANTOIN MONOHYD MACRO 100 MG PO CAPS: 100.0000 mg | ORAL_CAPSULE | Freq: Two times a day (BID) | ORAL | 0 refills | Status: AC

## 2024-02-12 NOTE — Discharge Instructions (Addendum)
 UTI with dysuria: UA is abnormal.  Reviewed culture from October 2025.  Will treat with nitrofurantoin , 100 mg twice daily for 7 days.  Patient was previously treated with Vantin  in October.  Get plenty of fluids and rest.  Will adjust the plan of care, if needed once the culture results.  Encouraged to use D-Mannose pills (500 mg), 1-2 pills twice daily for UTI prevention.  This is an over-the-counter product and could be obtained from most drugstores or health food stores and online.  This should be used every day for prevention of UTI and is not effective if taken only as needed.   Follow-up if symptoms do not improve, worsen or new symptoms occur.

## 2024-02-12 NOTE — ED Provider Notes (Signed)
 " [name]    CSN: [id number] Arrival date & time: 02/12/24  1022      History   Chief Complaint Chief Complaint  Patient presents with   Urinary Frequency    Uti - Entered by patient    HPI Amanda [name] is a 84 y.o. female.   84 year old female who sees urology.  She has been to this urgent care twice previously for UTIs in July and October 2025.  She has had burning and frequency of urination since 02/10/2024.  She feels like she has an acute UTI.  She denies fever, nausea, vomiting, constipation, diarrhea.  She reports that periodically she has episodes of diarrhea and usually within a few days of those episodes she ends up with an acute UTI.  She had diarrhea approximately 5 days ago for about 24 hours.   Urinary Frequency Pertinent negatives include no chest pain, no abdominal pain and no shortness of breath.    Past Medical History:  Diagnosis Date   (HFpEF) heart failure with preserved ejection fraction (HCC) 12/13/2022   Abnormal nuclear stress test 06/23/2016   Overview:   Added automatically from request for surgery [id number]     Acute ischemic stroke (HCC)    Acute kidney failure, unspecified 02/02/2022   Acute metabolic encephalopathy    Acute on chronic combined systolic and diastolic hrt fail (HCC) 12/16/2022   Acute on chronic systolic (congestive) heart failure (HCC)    Acute renal insufficiency 09/02/2016   Acute respiratory failure with hypoxia (HCC) 12/16/2022   Age-related osteoporosis without current pathological fracture 02/02/2022   AKI (acute kidney injury) 09/01/2016   Anemia due to stage 4 chronic kidney disease (HCC) 04/04/2015   Anemia in chronic kidney disease 02/02/2022   Atherosclerosis of aorta 02/02/2022   Athscl heart disease of native coronary artery w/o ang pctrs 02/02/2022   Atrial fibrillation (HCC)    Atrial fibrillation with RVR (HCC) 12/06/2022   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)  04/04/2015   Bradycardia 09/17/2014   Chest pain 06/23/2016   Overview:  Added automatically from request for surgery [id number]   Chronic anemia    Chronic kidney disease (CKD), stage III (moderate) (HCC)    Chronic kidney disease, stage 4 (severe) (HCC) 02/02/2022   Chronic systolic CHF (congestive heart failure) (HCC)    Closed left hip fracture (HCC) 03/23/2021   Constipation 03/28/2021   Constipation, unspecified 02/02/2022   Coronary artery disease 10/12/2016   Non drug-eluting stent implanted in June 2018 to mid RCA   08/08/2016 10:37  Angiographic Findings  Cardiac Arteries and Lesion Findings LMCA: 0% and Normal. LAD: 0% and Normal. RCA:   Lesion on Mid RCA: Mid subsection.95% stenosis reduced to 0%. Pre procedure   TIMI II flow was noted. Post Procedure TIMI III flow was present. Poor run   off was present. The lesion was diagnosed as High Risk (C).    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) 04/04/2015   Diabetes mellitus without complication (HCC)    Diverticulosis    Dyslipidemia 09/17/2014   Gastro-esophageal reflux disease without esophagitis 02/02/2022   Gastroesophageal reflux    GI bleed 08/06/2016   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   History of cardiac monitoring 02/23/2018   monitor inserted   History of falling 02/02/2022   History of iron deficiency anemia 12/28/2017   History of transesophageal echocardiography (TEE)    Hypercalcemia 04/04/2015   Hyperlipidemia  Hyperlipidemia, unspecified 02/02/2022   Hypertensive disorder 07/21/2017   Hypertensive heart disease with heart failure (HCC)    Hypothyroidism, unspecified 02/02/2022   Iron deficiency anemia 12/28/2017   Iron deficiency anemia, unspecified 02/02/2022   Ischemic cardiomyopathy 02/02/2022   Kidney disease 07/21/2017   Long term (current) use of insulin  (HCC) 02/02/2022   LV dysfunction 09/02/2016   Mitral regurgitation severe based on TEE from 2022 April  06/10/2020   Myocardial infarction [facility])    NSTEMI (non-ST elevated myocardial infarction) (HCC) 08/06/2016   Obstructive sleep apnea 09/17/2014   Obstructive sleep apnea (adult) (pediatric) 02/02/2022   Old myocardial infarction 02/02/2022   Orthostatic hypotension 12/28/2017   OSA on CPAP    Other specified heart block 02/02/2022   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   Paroxysmal atrial flutter (HCC) 07/22/2022   Personal history of other diseases of the digestive system 02/02/2022   Postural dizziness with presyncope 09/23/2017   Presence of cardiac pacemaker 02/02/2022   Presence of other cardiac implants and grafts 02/02/2022   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/24/2017   Prsnl hx of TIA (TIA), and cereb infrc w/o resid deficits 02/02/2022   Recurrent left pleural effusion 03/14/2015   Renal failure, chronic, stage 3 (moderate) (HCC)    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/2022   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   Secondary hypercoagulable state 04/23/2021   Syncope 11/04/2017   Systolic heart failure (HCC)    Thyroid  disease    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   Unspecified atrial fibrillation (HCC) 12/16/2022   Vascular disorder of intestine, unspecified 02/02/2022   Vitamin D  deficiency 04/04/2015    Patient Active Problem List   Diagnosis Date Noted   Heme positive stool 02/15/2023   Abnormal CT scan, liver 02/15/2023   Acute on chronic combined systolic and diastolic hrt fail (HCC) 12/16/2022   Acute respiratory failure with hypoxia (HCC) 12/16/2022   Hypertensive heart disease with heart failure (HCC) 12/16/2022   Unspecified atrial fibrillation (HCC) 12/16/2022   (HFpEF) heart failure with preserved ejection fraction (HCC) 12/13/2022   Atrial fibrillation with RVR (HCC) 12/06/2022   Atrial flutter (HCC) 07/22/2022   Acute kidney failure, unspecified  02/02/2022   Age-related osteoporosis without current pathological fracture 02/02/2022   Anemia in chronic kidney disease 02/02/2022   Atherosclerosis of aorta 02/02/2022   Athscl heart disease of native coronary artery w/o ang pctrs 02/02/2022   Chronic kidney disease, stage 4 (severe) (HCC) 02/02/2022   Constipation, unspecified 02/02/2022   Gastro-esophageal reflux disease without esophagitis 02/02/2022   Gout, unspecified 02/02/2022   History of falling 02/02/2022   Hyperlipidemia, unspecified 02/02/2022   Hypothyroidism, unspecified 02/02/2022   Iron deficiency anemia, unspecified 02/02/2022   Ischemic cardiomyopathy 02/02/2022   Long term (current) use of insulin  (HCC) 02/02/2022   Obstructive sleep apnea (adult) (pediatric) 02/02/2022   Vascular disorder of intestine, unspecified 02/02/2022   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/2022   Prsnl hx of TIA (TIA), and cereb infrc w/o resid deficits 02/02/2022   Old myocardial infarction 02/02/2022   Other specified heart block 02/02/2022   Personal history of other diseases of the digestive system 02/02/2022   Presence of cardiac pacemaker 02/02/2022   Presence of heart assist device (HCC) 02/02/2022   Secondary hypercoagulable state 04/23/2021   Constipation 03/28/2021   S/P  total left hip arthroplasty 03/25/2021   Closed left hip fracture (HCC) 03/23/2021   Mitral regurgitation severe based on TEE from 2022 April 06/10/2020   Thyroid  disease    Chronic systolic CHF (congestive heart failure) (HCC)    OSA on CPAP    Gastroesophageal reflux    Diverticulosis    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   History of cardiac monitoring 02/23/2018   History of iron deficiency anemia 12/28/2017   Kidney disease 07/21/2017   Hypertensive disorder 07/21/2017   Presence of Watchman left  atrial appendage closure device 06/24/2017   Coronary artery disease 10/12/2016   AKI (acute kidney injury) 09/01/2016   Abnormal nuclear stress test 06/23/2016   Anemia due to stage 4 chronic kidney disease (HCC) 04/04/2015   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) 04/04/2015   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) 04/04/2015   Vitamin D  deficiency 04/04/2015   Hypercalcemia 04/04/2015   Atrial fibrillation (HCC) 09/17/2014   Dyslipidemia 09/17/2014    Past Surgical History:  Procedure Laterality Date   BIOPSY  02/15/2023   Procedure: BIOPSY;  Surgeon: Charlanne Groom, MD;  Location: [facility];  Service: Gastroenterology;;   VASSIE STUDY  05/16/2020   Procedure: BUBBLE STUDY;  Surgeon: Loni Soyla LABOR, MD;  Location: [facility] ENDOSCOPY;  Service: Cardiovascular;;   CARDIAC CATHETERIZATION     CARDIOVERSION N/A 05/16/2020   Procedure: CARDIOVERSION;  Surgeon: Loni Soyla LABOR, MD;  Location: [facility] ENDOSCOPY;  Service: Cardiovascular;  Laterality: N/A;   CARDIOVERSION N/A 09/06/2020   Procedure: CARDIOVERSION;  Surgeon: Kate Lonni CROME, MD;  Location: [facility] ENDOSCOPY;  Service: Cardiovascular;  Laterality: N/A;   CARDIOVERSION N/A 05/16/2021   Procedure: CARDIOVERSION;  Surgeon: Shlomo Wilbert SAUNDERS, MD;  Location: [facility] ENDOSCOPY;  Service: Cardiovascular;  Laterality: N/A;   CARDIOVERSION N/A 08/12/2022   Procedure: CARDIOVERSION;  Surgeon: Sheena Pugh, DO;  Location: [facility] INVASIVE CV LAB;  Service: Cardiovascular;  Laterality: N/A;   CARDIOVERSION N/A 09/30/2022   Procedure: CARDIOVERSION;  Surgeon: Lonni Slain, MD;  Location: [facility] INVASIVE CV LAB;  Service: Cardiovascular;  Laterality: N/A;   CARDIOVERSION N/A 12/08/2022   Procedure: CARDIOVERSION (CATH LAB);  Surgeon: Santo Stanly LABOR, MD;  Location: [facility] INVASIVE CV LAB;  Service: Cardiovascular;  Laterality: N/A;   CARDIOVERSION N/A 12/14/2022   Procedure:  CARDIOVERSION (CATH LAB);  Surgeon: Lonni Slain, MD;  Location: [facility] INVASIVE CV LAB;  Service: Cardiovascular;  Laterality: N/A;   COLONOSCOPY  11/21/2014   Moderate predominantly sigmoid diverticulosis. Otherwise noraml collonscopy to TI.    CORONARY ANGIOPLASTY WITH STENT PLACEMENT  08/2016   ESOPHAGOGASTRODUODENOSCOPY (EGD) WITH PROPOFOL  N/A 02/15/2023   Procedure: ESOPHAGOGASTRODUODENOSCOPY (EGD) WITH PROPOFOL ;  Surgeon: Charlanne Groom, MD;  Location: [facility]COPY;  Service: Gastroenterology;  Laterality: N/A;   EXCISIONAL HEMORRHOIDECTOMY     LEFT ATRIAL APPENDAGE OCCLUSION  11/2016   in [place]   LOOP RECORDER INSERTION N/A 02/23/2018   Procedure: LOOP RECORDER INSERTION;  Surgeon: Inocencio Soyla Lunger, MD;  Location: [facility] CV LAB;  Service: Cardiovascular;  Laterality: N/A;   LOOP RECORDER REMOVAL N/A 04/24/2019   Procedure: LOOP RECORDER REMOVAL;  Surgeon: Inocencio Soyla Lunger, MD;  Location: [facility] LAB;  Service: Cardiovascular;  Laterality: N/A;   MALONEY DILATION N/A 02/15/2023   Procedure: AGAPITO DILATION;  Surgeon: Charlanne Groom, MD;  Location: [facility];  Service: Gastroenterology;  Laterality: N/A;   NOSE SURGERY  PACEMAKER IMPLANT N/A 04/24/2019   Procedure: PACEMAKER IMPLANT;  Surgeon: Inocencio Soyla Lunger, MD;  Location: [facility]VAS[facility] CV LAB;  Service: Cardiovascular;  Laterality: N/A;   TEE WITH CARDIOVERSION     TEE WITHOUT CARDIOVERSION N/A 05/16/2020   Procedure: TRANSESOPHAGEAL ECHOCARDIOGRAM (TEE);  Surgeon: Loni Soyla LABOR, MD;  Location: [facility] ENDOSCOPY;  Service: Cardiovascular;  Laterality: N/A;   TEE WITHOUT CARDIOVERSION N/A 08/12/2022   Procedure: TRANSESOPHAGEAL ECHOCARDIOGRAM;  Surgeon: Sheena Pugh, DO;  Location: [facility];  Service: Cardiovascular;  Laterality: N/A;   TEE WITHOUT CARDIOVERSION N/A 09/30/2022   Procedure: TRANSESOPHAGEAL ECHOCARDIOGRAM;  Surgeon: Lonni Slain, MD;  Location: [facility] INVASIVE CV LAB;   Service: Cardiovascular;  Laterality: N/A;   TOTAL HIP REVISION Left 03/25/2021   Procedure: LEFT POSTERIOR TOTAL HIP  ZIMMER CABLES;  Surgeon: Ernie Cough, MD;  Location: [facility];  Service: Orthopedics;  Laterality: Left;   TRANSESOPHAGEAL ECHOCARDIOGRAM (CATH LAB) N/A 05/31/2023   Procedure: TRANSESOPHAGEAL ECHOCARDIOGRAM;  Surgeon: Francyne Headland, MD;  Location: [facility] CV LAB;  Service: Cardiovascular;  Laterality: N/A;    OB History   No obstetric history on file.      Home Medications    Prior to Admission medications  Medication Sig Start Date End Date Taking? Authorizing Provider  amLODipine  (NORVASC ) 5 MG tablet Take 1 tablet (5 mg total) by mouth daily. 08/10/23 02/12/24 Yes Krasowski, Robert J, MD  cloNIDine  (CATAPRES ) 0.1 MG tablet Take 0.1 mg by mouth 2 (two) times daily. 12/25/22  Yes [provider]  denosumab  (PROLIA ) 60 MG/ML SOSY injection Inject 60 mg into the skin every 6 (six) months.   Yes [provider]  Evolocumab  (REPATHA  SURECLICK) 140 MG/ML SOAJ Inject 140 mg into the skin every 14 (fourteen) days. 06/10/23  Yes Krasowski, Robert J, MD  hydrALAZINE  (APRESOLINE ) 50 MG tablet TAKE 1 TABLET BY MOUTH EVERY SIX HOURS. 12/22/23  Yes Krasowski, Robert J, MD  isosorbide  mononitrate (IMDUR ) 60 MG 24 hr tablet Take 60 mg by mouth daily.   Yes [provider]  levothyroxine  (SYNTHROID , LEVOTHROID) 50 MCG tablet Take 50 mcg by mouth daily before breakfast.   Yes [provider]  metoprolol  succinate (TOPROL -XL) 50 MG 24 hr tablet Take 1.5 tablets (75 mg total) by mouth daily. Take with or immediately following a meal. 08/10/23 02/12/24 Yes Bernie Lamar PARAS, MD  nitrofurantoin , macrocrystal-monohydrate, (MACROBID ) 100 MG capsule Take 1 capsule (100 mg total) by mouth 2 (two) times daily. 02/12/24  Yes Ival Domino, FNP  sodium bicarbonate 650 MG tablet Take 650 mg by mouth. 01/25/24  Yes [provider]  Biotin 1000 MCG tablet  Take 1,000 mcg by mouth daily.    [provider]  blood glucose meter kit and supplies KIT Dispense based on patient and insurance preference. Use up to four times daily as directed. Patient taking differently: Inject 1 each into the skin See admin instructions. Dispense based on patient and insurance preference. Use up to four times daily as directed. 03/31/21   Swayze, Ava, DO  carboxymethylcellul-glycerin (REFRESH RELIEVA) 0.5-0.9 % ophthalmic solution Place 1 drop into both eyes 4 (four) times daily as needed for dry eyes. VE    [provider]  Cholecalciferol  (VITAMIN D3) 50 MCG (2000 UT) TABS Take 2,000 Units by mouth daily at 12 noon.    [provider]  Coenzyme Q10 100 MG capsule Take 100 mg by mouth 2 (two) times daily.    [provider]  colchicine 0.6 MG  tablet Take 0.6 mg by mouth daily as needed (Gout).    [provider]  docusate sodium  (COLACE) 100 MG capsule Take 100 mg by mouth in the morning.    [provider]  estradiol  (ESTRACE ) 0.1 MG/GM vaginal cream Place 1 Applicatorful vaginally every Monday, Wednesday, and Friday.     [provider]  famotidine  (PEPCID ) 40 MG tablet Take 40 mg by mouth in the morning. 03/10/23   [provider]  febuxostat  (ULORIC ) 40 MG tablet Take 40 mg by mouth every evening. 05/06/23   [provider]  ferrous sulfate  325 (65 FE) MG EC tablet Take 325 mg by mouth 2 (two) times daily with breakfast and lunch. 12/28/17   [provider]  folic acid  (FOLVITE ) 800 MCG tablet Take 800 mcg by mouth daily at 12 noon.    [provider]  Glucosamine-Chondroitin (OSTEO BI-FLEX REGULAR STRENGTH PO) Take 25 mcg by mouth daily at 12 noon. Plus D3    [provider]  HUMALOG KWIKPEN 100 UNIT/ML KwikPen Inject 2-6 Units into the skin See admin instructions. Per sliding scale 71-150=0 151-200=2 201-250=4 251-300=6 301-350=8 351-400=10 Check level in 2  hours and if no results call MD 12/25/22   [provider]  Lactobacillus (FLORAJEN ACIDOPHILUS) CAPS Take 1 capsule by mouth daily at 12 noon. 390 mg each    [provider]  Multiple Vitamins-Minerals (PRESERVISION AREDS 2) CAPS Take 1 capsule by mouth 2 (two) times daily.    [provider]  nitroGLYCERIN  (NITROSTAT ) 0.4 MG SL tablet Place 1 tablet (0.4 mg total) under the tongue every 5 (five) minutes as needed for chest pain. 08/10/23   Krasowski, Robert J, MD  Nutritional Supplements (GLUCERNA MEAL PO) Take 237 mLs by mouth daily.    [provider]  omega-3 acid ethyl esters (LOVAZA ) 1 g capsule TAKE 2 CAPSULES BY MOUTH DAILY. 06/03/23   Krasowski, Robert J, MD  pantoprazole  (PROTONIX ) 40 MG tablet Take 1 tablet (40 mg total) by mouth daily. 10/14/21   Krasowski, Robert J, MD  Polyethylene Glycol 3350  (MIRALAX  PO) Take 1 Capful by mouth daily as needed (Constipation).    [provider]  pravastatin  (PRAVACHOL ) 40 MG tablet Take 40 mg by mouth every other day.    [provider]  ranolazine  (RANEXA ) 500 MG 12 hr tablet Take 1 tablet (500 mg total) by mouth 2 (two) times daily. 01/17/24   Krasowski, Robert J, MD  torsemide  (DEMADEX ) 20 MG tablet Take 20 mg by mouth 2 (two) times daily. 12/25/22   [provider]  vitamin B-12 (CYANOCOBALAMIN ) 1000 MCG tablet Take 1,000 mcg by mouth in the morning and at bedtime.    [provider]  Wheat Dextrin (BENEFIBER PO) Take 3 tablets by mouth daily.    [provider]    Family History Family History  Problem Relation Age of Onset   Breast cancer Mother    Hypertension Father    Prostate cancer Father    Stroke Father    Diabetes Sister    Heart attack Sister    Heart attack Brother    Cancer Maternal Grandfather        not sure if it is colon or rectal    Heart attack Maternal Uncle    Rectal cancer Maternal Uncle    Stroke Maternal Uncle    Esophageal cancer Neg  Hx    Pancreatic cancer Neg Hx    Stomach cancer Neg Hx  Social History Social History[1]   Allergies   Ciprofloxacin, Contrast media [iodinated contrast media], Crestor [rosuvastatin], Lipitor [atorvastatin], Nsaids, Statins, and Sulfa antibiotics   Review of Systems Review of Systems  Constitutional:  Negative for chills and fever.  HENT:  Negative for ear pain and sore throat.   Eyes:  Negative for pain and visual disturbance.  Respiratory:  Negative for cough and shortness of breath.   Cardiovascular:  Negative for chest pain and palpitations.  Gastrointestinal:  Negative for abdominal pain, constipation, diarrhea, nausea and vomiting.  Genitourinary:  Positive for dysuria and frequency. Negative for hematuria.  Musculoskeletal:  Negative for arthralgias and back pain.  Skin:  Negative for color change and rash.  Neurological:  Negative for seizures and syncope.  All other systems reviewed and are negative.    Physical Exam Triage Vital Signs ED Triage Vitals  Encounter Vitals Group     BP 02/12/24 1108 (!) 152/87     Girls Systolic BP Percentile --      Girls Diastolic BP Percentile --      Boys Systolic BP Percentile --      Boys Diastolic BP Percentile --      Pulse Rate 02/12/24 1108 81     Resp 02/12/24 1108 18     Temp 02/12/24 1108 97.7 F (36.5 C)     Temp Source 02/12/24 1108 Oral     SpO2 02/12/24 1108 97 %     Weight --      Height --      Head Circumference --      Peak Flow --      Pain Score 02/12/24 1106 0     Pain Loc --      Pain Education --      Exclude from Growth Chart --    No data found.  Updated Vital Signs BP (!) 152/87 (BP Location: Right Arm)   Pulse 81   Temp 97.7 F (36.5 C) (Oral)   Resp 18   SpO2 97%   Visual Acuity Right Eye Distance:   Left Eye Distance:   Bilateral Distance:    Right Eye Near:   Left Eye Near:    Bilateral Near:     Physical Exam Vitals and nursing note reviewed.  Constitutional:       General: She is not in acute distress.    Appearance: She is well-developed. She is not ill-appearing or toxic-appearing.  HENT:     Head: Normocephalic and atraumatic.     Right Ear: Hearing, tympanic membrane, ear canal and external ear normal.     Left Ear: Hearing, tympanic membrane, ear canal and external ear normal.     Nose: No congestion or rhinorrhea.     Right Sinus: No maxillary sinus tenderness or frontal sinus tenderness.     Left Sinus: No maxillary sinus tenderness or frontal sinus tenderness.     Mouth/Throat:     Lips: Pink.     Mouth: Mucous membranes are moist.     Pharynx: Uvula midline. No oropharyngeal exudate or posterior oropharyngeal erythema.     Tonsils: No tonsillar exudate.  Eyes:     Conjunctiva/sclera: Conjunctivae normal.     Pupils: Pupils are equal, round, and reactive to light.  Cardiovascular:     Rate and Rhythm: Normal rate and regular rhythm.     Heart sounds: S1 normal and S2 normal. No murmur heard. Pulmonary:     Effort: Pulmonary effort is normal. No respiratory  distress.     Breath sounds: Normal breath sounds. No decreased breath sounds, wheezing, rhonchi or rales.  Abdominal:     General: Bowel sounds are normal.     Palpations: Abdomen is soft.     Tenderness: There is no abdominal tenderness. There is no right CVA tenderness, left CVA tenderness, guarding or rebound. Negative signs include Murphy's sign, Rovsing's sign and McBurney's sign.  Musculoskeletal:        General: No swelling.     Cervical back: Neck supple.  Lymphadenopathy:     Head:     Right side of head: No submental, submandibular, tonsillar, preauricular or posterior auricular adenopathy.     Left side of head: No submental, submandibular, tonsillar, preauricular or posterior auricular adenopathy.     Cervical: No cervical adenopathy.     Right cervical: No superficial cervical adenopathy.    Left cervical: No superficial cervical adenopathy.  Skin:    General: Skin  is warm and dry.     Capillary Refill: Capillary refill takes less than 2 seconds.     Findings: No rash.  Neurological:     Mental Status: She is alert and oriented to person, place, and time.     Gait: Gait abnormal (Walks with a cane on the right).  Psychiatric:        Mood and Affect: Mood normal.      UC Treatments / Results  Labs (all labs ordered are listed, but only abnormal results are displayed) Labs Reviewed  POCT URINE DIPSTICK - Abnormal; Notable for the following components:      Result Value   Clarity, UA hazy (*)    Blood, UA small (*)    Protein Ur, POC >=300 (*)    Nitrite, UA Positive (*)    Leukocytes, UA Large (3+) (*)    All other components within normal limits  URINE CULTURE   Urine Culture: 11/24/23:     Component Ref Range & Units (hover) 2 mo ago  Specimen Description URINE, CLEAN CATCH  Special Requests NONE Performed at [facility], [address], [place], [place] [address]  Culture >=100,000 COLONIES/mL ESCHERICHIA COLI Abnormal   Report Status 11/26/2023 FINAL  Organism ID, Bacteria ESCHERICHIA COLI Abnormal   Resulting Agency [facility]     Susceptibility   Escherichia coli    MIC    AMPICILLIN 4 SENSITIVE Sensitive    AMPICILLIN/SULBACTAM <=2 SENSITIVE Sensitive    CEFAZOLIN  (URINE)  Sensitive 1    CEFEPIME <=0.12 SENS... Sensitive    CEFTRIAXONE  <=0.25 SENS... Sensitive    CIPROFLOXACIN <=0.06 SENS... Sensitive    ERTAPENEM <=0.12 SENS... Sensitive    GENTAMICIN  <=1 SENSITIVE Sensitive    MEROPENEM <=0.25 SENS... Sensitive    NITROFURANTOIN  <=16 SENSIT... Sensitive    PIP/TAZO  Sensitive 2    TRIMETH/SULFA <=20 SENSIT... Sensitive       EKG   Radiology No results found.  Procedures Procedures (including critical care time)  Medications Ordered in UC Medications - No data to display  Initial Impression / Assessment and Plan / UC Course  I have reviewed the triage vital signs and the nursing  notes.  Pertinent labs & imaging results that were available during my care of the patient were reviewed by me and considered in my medical decision making (see chart for details).  Plan of Care (see discharge instructions for additional patient precautions and education): UTI with dysuria: UA is abnormal.  Reviewed culture from October 2025.  Will treat with nitrofurantoin , 100 mg twice daily for 7 days.  Patient was previously treated with Vantin  in October.  Get plenty of fluids and rest.  Will adjust the plan of care, if needed once the culture results.  Encouraged to use D-Mannose pills (500 mg), 1-2 pills twice daily for UTI prevention.  This is an over-the-counter product and could be obtained from most drugstores or health food stores and online.  This should be used every day for prevention of UTI and is not effective if taken only as needed.   Follow-up if symptoms do not improve, worsen or new symptoms occur.  I reviewed the plan of care with the patient and/or the patient's guardian.  The patient and/or guardian had time to ask questions and acknowledged that the questions were answered.  Final Clinical Impressions(s) / UC Diagnoses   Final diagnoses:  Dysuria  Urinary tract infection with hematuria, site unspecified     Discharge Instructions      UTI with dysuria: UA is abnormal.  Reviewed culture from October 2025.  Will treat with nitrofurantoin , 100 mg twice daily for 7 days.  Patient was previously treated with Vantin  in October.  Get plenty of fluids and rest.  Will adjust the plan of care, if needed once the culture results.  Encouraged to use D-Mannose pills (500 mg), 1-2 pills twice daily for UTI prevention.  This is an over-the-counter product and could be obtained from most drugstores or health food stores and online.  This should be used every day for prevention of UTI and is not effective if taken only as needed.   Follow-up if symptoms do not improve, worsen or  new symptoms occur.     ED Prescriptions     Medication Sig Dispense Auth. Provider   nitrofurantoin , macrocrystal-monohydrate, (MACROBID ) 100 MG capsule Take 1 capsule (100 mg total) by mouth 2 (two) times daily. 10 capsule Ival Domino, FNP      PDMP not reviewed this encounter.    [1]  Social History Tobacco Use   Smoking status: Former   Smokeless tobacco: Former    Quit date: 1997   Tobacco comments:    Former smoker 05/22/21  Vaping Use   Vaping status: Never Used  Substance Use Topics   Alcohol use: Yes    Alcohol/week: 2.0 standard drinks of alcohol    Types: 2 Standard drinks or equivalent per week    Comment: Occ   Drug use: No     Ival Domino, FNP 02/12/24 1142  "

## 2024-02-12 NOTE — ED Triage Notes (Signed)
 Pt c/o urinary burning, frequency, and urgency started 2 days ago.

## 2024-02-13 ENCOUNTER — Ambulatory Visit (HOSPITAL_BASED_OUTPATIENT_CLINIC_OR_DEPARTMENT_OTHER): Payer: Self-pay | Admitting: Family Medicine

## 2024-02-14 LAB — URINE CULTURE: Culture: >=100000 — AB

## 2024-02-14 NOTE — Progress Notes (Signed)
 Culture is positive.  Culture is sensitive to nitrofurantoin .  Patient updated via voicemail message to complete the nitrofurantoin  and return to the urgent care if she has any problems.

## 2024-02-18 ENCOUNTER — Ambulatory Visit (HOSPITAL_BASED_OUTPATIENT_CLINIC_OR_DEPARTMENT_OTHER)
Admission: RE | Admit: 2024-02-18 | Discharge: 2024-02-18 | Disposition: A | Discharge status: Home / Self Care | Attending: Family Medicine | Admitting: Family Medicine

## 2024-02-18 ENCOUNTER — Encounter (HOSPITAL_BASED_OUTPATIENT_CLINIC_OR_DEPARTMENT_OTHER): Payer: Self-pay

## 2024-02-18 VITALS — BP 145/86 | HR 124 | Temp 97.6°F | Resp 18

## 2024-02-18 DIAGNOSIS — K623 Rectal prolapse: Secondary | ICD-10-CM

## 2024-02-18 DIAGNOSIS — R1024 Suprapubic pain: Secondary | ICD-10-CM

## 2024-02-18 DIAGNOSIS — N390 Urinary tract infection, site not specified: Secondary | ICD-10-CM | POA: Diagnosis not present

## 2024-02-18 MED ORDER — CEPHALEXIN 500 MG PO CAPS: 1000.0000 mg | ORAL_CAPSULE | Freq: Two times a day (BID) | ORAL | 0 refills | Status: AC

## 2024-02-18 NOTE — ED Notes (Signed)
 Only scant amount of urine obtained. Attempted to use dropper to process test strip with inadequate amount of urine.

## 2024-02-18 NOTE — ED Triage Notes (Addendum)
 Was seen on 1/10 for UTI, was given medications but  Still having sx, frequency, urine looks cloudy  Has a rectal prolapse

## 2024-02-18 NOTE — ED Provider Notes (Signed)
 " Amanda [name] [facility]    CSN: [id number] Arrival date & time: 02/18/24  1335      History   Chief Complaint Chief Complaint  Patient presents with   Urinary Frequency    Return visit for UTI which was treated but has come back at the end of the treatment period. - Entered by patient    HPI Amanda [name] is a 84 y.o. female.   84 year old female who sees urology.  She has been to this urgent care twice previously for UTIs in July and October 2025.  She has had burning and frequency of urination since 02/10/2024.  She was seen on 02/12/2024 and was positive for an acute UTI.   She reports that periodically she has episodes of diarrhea and usually within a few days of those episodes she ends up with an acute UTI.  She had diarrhea on approximately 02/07/2024 for about 24 hours.  She was treated on 02/12/2024 with nitrofurantoin  but only 5 days of medication was sent in.  However she feels like the nitrofurantoin  has not really worked anyway.  Her culture was positive.  She feels like she is still having frequency and her urine is cloudy and she thinks the UTI is not responding to the antibiotics.  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   Urinary Frequency Pertinent negatives include no chest pain, no abdominal pain and no shortness of breath.    Past Medical History:  Diagnosis Date   (HFpEF) heart failure with preserved ejection fraction (HCC) 12/13/2022   Abnormal nuclear stress test 06/23/2016   Overview:   Added automatically from request for surgery [id number]     Acute ischemic stroke (HCC)    Acute kidney failure, unspecified 02/02/2022   Acute metabolic encephalopathy    Acute on chronic combined systolic and diastolic hrt fail (HCC) 12/16/2022   Acute on chronic systolic (congestive) heart failure (HCC)    Acute renal insufficiency 09/02/2016   Acute respiratory failure with hypoxia (HCC) 12/16/2022   Age-related osteoporosis without current pathological fracture 02/02/2022   AKI  (acute kidney injury) 09/01/2016   Anemia due to stage 4 chronic kidney disease (HCC) 04/04/2015   Anemia in chronic kidney disease 02/02/2022   Atherosclerosis of aorta 02/02/2022   Athscl heart disease of native coronary artery w/o ang pctrs 02/02/2022   Atrial fibrillation (HCC)    Atrial fibrillation with RVR (HCC) 12/06/2022   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) 04/04/2015   Bradycardia 09/17/2014   Chest pain 06/23/2016   Overview:  Added automatically from request for surgery [id number]   Chronic anemia    Chronic kidney disease (CKD), stage III (moderate) (HCC)    Chronic kidney disease, stage 4 (severe) (HCC) 02/02/2022   Chronic systolic CHF (congestive heart failure) (HCC)    Closed left hip fracture (HCC) 03/23/2021   Constipation 03/28/2021   Constipation, unspecified 02/02/2022   Coronary artery disease 10/12/2016   Non drug-eluting stent implanted in June 2018 to mid RCA   08/08/2016 10:37  Angiographic Findings  Cardiac Arteries and Lesion Findings LMCA: 0% and Normal. LAD: 0% and Normal. RCA:   Lesion on Mid RCA: Mid subsection.95% stenosis reduced to 0%. Pre procedure   TIMI II flow was noted. Post Procedure TIMI III flow was present. Poor run   off was present. The lesion was diagnosed as High Risk (C).    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) 04/04/2015   Diabetes  mellitus without complication (HCC)    Diverticulosis    Dyslipidemia 09/17/2014   Gastro-esophageal reflux disease without esophagitis 02/02/2022   Gastroesophageal reflux    GI bleed 08/06/2016   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   History of cardiac monitoring 02/23/2018   monitor inserted   History of falling 02/02/2022   History of iron deficiency anemia 12/28/2017   History of transesophageal echocardiography (TEE)    Hypercalcemia 04/04/2015   Hyperlipidemia    Hyperlipidemia, unspecified 02/02/2022   Hypertensive disorder  07/21/2017   Hypertensive heart disease with heart failure (HCC)    Hypothyroidism, unspecified 02/02/2022   Iron deficiency anemia 12/28/2017   Iron deficiency anemia, unspecified 02/02/2022   Ischemic cardiomyopathy 02/02/2022   Kidney disease 07/21/2017   Long term (current) use of insulin  (HCC) 02/02/2022   LV dysfunction 09/02/2016   Mitral regurgitation severe based on TEE from 2022 April 06/10/2020   Myocardial infarction [facility])    NSTEMI (non-ST elevated myocardial infarction) (HCC) 08/06/2016   Obstructive sleep apnea 09/17/2014   Obstructive sleep apnea (adult) (pediatric) 02/02/2022   Old myocardial infarction 02/02/2022   Orthostatic hypotension 12/28/2017   OSA on CPAP    Other specified heart block 02/02/2022   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   Paroxysmal atrial flutter (HCC) 07/22/2022   Personal history of other diseases of the digestive system 02/02/2022   Postural dizziness with presyncope 09/23/2017   Presence of cardiac pacemaker 02/02/2022   Presence of other cardiac implants and grafts 02/02/2022   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/24/2017   Prsnl hx of TIA (TIA), and cereb infrc w/o resid deficits 02/02/2022   Recurrent left pleural effusion 03/14/2015   Renal failure, chronic, stage 3 (moderate) (HCC)    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/2022   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   Secondary hypercoagulable state 04/23/2021   Syncope 11/04/2017   Systolic heart failure (HCC)    Thyroid  disease    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   Unspecified atrial fibrillation (HCC) 12/16/2022   Vascular disorder of intestine, unspecified 02/02/2022   Vitamin D  deficiency 04/04/2015    Patient Active Problem List   Diagnosis Date Noted   Heme positive stool 02/15/2023   Abnormal CT scan, liver 02/15/2023   Acute on chronic combined systolic and  diastolic hrt fail (HCC) 12/16/2022   Acute respiratory failure with hypoxia (HCC) 12/16/2022   Hypertensive heart disease with heart failure (HCC) 12/16/2022   Unspecified atrial fibrillation (HCC) 12/16/2022   (HFpEF) heart failure with preserved ejection fraction (HCC) 12/13/2022   Atrial fibrillation with RVR (HCC) 12/06/2022   Atrial flutter (HCC) 07/22/2022   Acute kidney failure, unspecified 02/02/2022   Age-related osteoporosis without current pathological fracture 02/02/2022   Anemia in chronic kidney disease 02/02/2022   Atherosclerosis of aorta 02/02/2022   Athscl heart disease of native coronary artery w/o ang pctrs 02/02/2022   Chronic kidney disease, stage 4 (severe) (HCC) 02/02/2022   Constipation, unspecified 02/02/2022   Gastro-esophageal reflux disease without esophagitis 02/02/2022   Gout, unspecified 02/02/2022   History of falling 02/02/2022   Hyperlipidemia, unspecified 02/02/2022   Hypothyroidism, unspecified 02/02/2022   Iron deficiency anemia, unspecified 02/02/2022   Ischemic cardiomyopathy 02/02/2022   Long term (current) use of insulin  (HCC) 02/02/2022   Obstructive sleep apnea (adult) (pediatric) 02/02/2022   Vascular disorder of intestine, unspecified 02/02/2022   Type 2 diabetes mellitus with diabetic chronic kidney  disease (HCC) 02/02/2022   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/2022   Prsnl hx of TIA (TIA), and cereb infrc w/o resid deficits 02/02/2022   Old myocardial infarction 02/02/2022   Other specified heart block 02/02/2022   Personal history of other diseases of the digestive system 02/02/2022   Presence of cardiac pacemaker 02/02/2022   Presence of heart assist device (HCC) 02/02/2022   Secondary hypercoagulable state 04/23/2021   Constipation 03/28/2021   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   Closed left hip fracture (HCC) 03/23/2021   Mitral regurgitation severe based on TEE from 2022 April 06/10/2020    Thyroid  disease    Chronic systolic CHF (congestive heart failure) (HCC)    OSA on CPAP    Gastroesophageal reflux    Diverticulosis    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   History of cardiac monitoring 02/23/2018   History of iron deficiency anemia 12/28/2017   Kidney disease 07/21/2017   Hypertensive disorder 07/21/2017   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/24/2017   Coronary artery disease 10/12/2016   AKI (acute kidney injury) 09/01/2016   Abnormal nuclear stress test 06/23/2016   Anemia due to stage 4 chronic kidney disease (HCC) 04/04/2015   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) 04/04/2015   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) 04/04/2015   Vitamin D  deficiency 04/04/2015   Hypercalcemia 04/04/2015   Atrial fibrillation (HCC) 09/17/2014   Dyslipidemia 09/17/2014    Past Surgical History:  Procedure Laterality Date   BIOPSY  02/15/2023   Procedure: BIOPSY;  Surgeon: Charlanne Groom, MD;  Location: [facility];  Service: Gastroenterology;;   VASSIE STUDY  05/16/2020   Procedure: BUBBLE STUDY;  Surgeon: Loni Soyla LABOR, MD;  Location: [facility] ENDOSCOPY;  Service: Cardiovascular;;   CARDIAC CATHETERIZATION     CARDIOVERSION N/A 05/16/2020   Procedure: CARDIOVERSION;  Surgeon: Loni Soyla LABOR, MD;  Location: [facility] ENDOSCOPY;  Service: Cardiovascular;  Laterality: N/A;   CARDIOVERSION N/A 09/06/2020   Procedure: CARDIOVERSION;  Surgeon: Kate Lonni CROME, MD;  Location: [facility] ENDOSCOPY;  Service: Cardiovascular;  Laterality: N/A;   CARDIOVERSION N/A 05/16/2021   Procedure: CARDIOVERSION;  Surgeon: Shlomo Wilbert SAUNDERS, MD;  Location: [facility] ENDOSCOPY;  Service: Cardiovascular;  Laterality: N/A;   CARDIOVERSION N/A 08/12/2022   Procedure: CARDIOVERSION;  Surgeon: Sheena Pugh, DO;  Location: [facility] INVASIVE CV LAB;  Service: Cardiovascular;  Laterality: N/A;    CARDIOVERSION N/A 09/30/2022   Procedure: CARDIOVERSION;  Surgeon: Lonni Slain, MD;  Location: [facility] INVASIVE CV LAB;  Service: Cardiovascular;  Laterality: N/A;   CARDIOVERSION N/A 12/08/2022   Procedure: CARDIOVERSION (CATH LAB);  Surgeon: Santo Stanly LABOR, MD;  Location: [facility] INVASIVE CV LAB;  Service: Cardiovascular;  Laterality: N/A;   CARDIOVERSION N/A 12/14/2022   Procedure: CARDIOVERSION (CATH LAB);  Surgeon: Lonni Slain, MD;  Location: [facility] INVASIVE CV LAB;  Service: Cardiovascular;  Laterality: N/A;   COLONOSCOPY  11/21/2014   Moderate predominantly sigmoid diverticulosis. Otherwise noraml collonscopy to TI.    CORONARY ANGIOPLASTY WITH STENT PLACEMENT  08/2016   ESOPHAGOGASTRODUODENOSCOPY (EGD) WITH PROPOFOL  N/A 02/15/2023   Procedure: ESOPHAGOGASTRODUODENOSCOPY (EGD) WITH PROPOFOL ;  Surgeon: Charlanne Groom, MD;  Location: [facility] ENDOSCOPY;  Service: Gastroenterology;  Laterality: N/A;   EXCISIONAL HEMORRHOIDECTOMY     LEFT ATRIAL APPENDAGE OCCLUSION  11/2016   in [place]   LOOP RECORDER INSERTION N/A 02/23/2018   Procedure: LOOP RECORDER  INSERTION;  Surgeon: Inocencio Soyla Lunger, MD;  Location: [facility] INVASIVE CV LAB;  Service: Cardiovascular;  Laterality: N/A;   LOOP RECORDER REMOVAL N/A 04/24/2019   Procedure: LOOP RECORDER REMOVAL;  Surgeon: Inocencio Soyla Lunger, MD;  Location: [facility] INVASIVE CV LAB;  Service: Cardiovascular;  Laterality: N/A;   MALONEY DILATION N/A 02/15/2023   Procedure: AGAPITO DILATION;  Surgeon: Charlanne Groom, MD;  Location: [facility] ENDOSCOPY;  Service: Gastroenterology;  Laterality: N/A;   NOSE SURGERY     PACEMAKER IMPLANT N/A 04/24/2019   Procedure: PACEMAKER IMPLANT;  Surgeon: Inocencio Soyla Lunger, MD;  Location: [facility] INVASIVE CV LAB;  Service: Cardiovascular;  Laterality: N/A;   TEE WITH CARDIOVERSION     TEE WITHOUT CARDIOVERSION N/A 05/16/2020   Procedure: TRANSESOPHAGEAL ECHOCARDIOGRAM (TEE);  Surgeon: Loni Soyla LABOR, MD;  Location: [facility]  ENDOSCOPY;  Service: Cardiovascular;  Laterality: N/A;   TEE WITHOUT CARDIOVERSION N/A 08/12/2022   Procedure: TRANSESOPHAGEAL ECHOCARDIOGRAM;  Surgeon: Sheena Pugh, DO;  Location: [facility] INVASIVE CV LAB;  Service: Cardiovascular;  Laterality: N/A;   TEE WITHOUT CARDIOVERSION N/A 09/30/2022   Procedure: TRANSESOPHAGEAL ECHOCARDIOGRAM;  Surgeon: Lonni Slain, MD;  Location: [facility] INVASIVE CV LAB;  Service: Cardiovascular;  Laterality: N/A;   TOTAL HIP REVISION Left 03/25/2021   Procedure: LEFT POSTERIOR TOTAL HIP  ZIMMER CABLES;  Surgeon: Ernie Cough, MD;  Location: [facility] ORS;  Service: Orthopedics;  Laterality: Left;   TRANSESOPHAGEAL ECHOCARDIOGRAM (CATH LAB) N/A 05/31/2023   Procedure: TRANSESOPHAGEAL ECHOCARDIOGRAM;  Surgeon: Francyne Headland, MD;  Location: [facility] INVASIVE CV LAB;  Service: Cardiovascular;  Laterality: N/A;    OB History   No obstetric history on file.      Home Medications    Prior to Admission medications  Medication Sig Start Date End Date Taking? Authorizing Provider  cephALEXin  (KEFLEX ) 500 MG capsule Take 2 capsules (1,000 mg total) by mouth 2 (two) times daily for 7 days. 02/18/24 02/25/24 Yes Ival Domino, FNP  amLODipine  (NORVASC ) 5 MG tablet Take 1 tablet (5 mg total) by mouth daily. 08/10/23 02/12/24  Krasowski, Robert J, MD  Biotin 1000 MCG tablet Take 1,000 mcg by mouth daily.    [provider]  blood glucose meter kit and supplies KIT Dispense based on patient and insurance preference. Use up to four times daily as directed. Patient taking differently: Inject 1 each into the skin See admin instructions. Dispense based on patient and insurance preference. Use up to four times daily as directed. 03/31/21   Swayze, Ava, DO  carboxymethylcellul-glycerin (REFRESH RELIEVA) 0.5-0.9 % ophthalmic solution Place 1 drop into both eyes 4 (four) times daily as needed for dry eyes. VE    [provider]  Cholecalciferol  (VITAMIN D3) 50 MCG (2000 UT) TABS Take  2,000 Units by mouth daily at 12 noon.    [provider]  cloNIDine  (CATAPRES ) 0.1 MG tablet Take 0.1 mg by mouth 2 (two) times daily. 12/25/22   [provider]  Coenzyme Q10 100 MG capsule Take 100 mg by mouth 2 (two) times daily.    [provider]  colchicine 0.6 MG tablet Take 0.6 mg by mouth daily as needed (Gout).    [provider]  denosumab  (PROLIA ) 60 MG/ML SOSY injection Inject 60 mg into the skin every 6 (six) months.    [provider]  docusate sodium  (COLACE) 100 MG capsule Take 100 mg by mouth in the morning.    [provider]  estradiol  (ESTRACE ) 0.1 MG/GM vaginal cream Place 1 Applicatorful vaginally every Monday,  Wednesday, and Friday.     [provider]  Evolocumab  (REPATHA  SURECLICK) 140 MG/ML SOAJ Inject 140 mg into the skin every 14 (fourteen) days. 06/10/23   Krasowski, Robert J, MD  famotidine  (PEPCID ) 40 MG tablet Take 40 mg by mouth in the morning. 03/10/23   [provider]  febuxostat  (ULORIC ) 40 MG tablet Take 40 mg by mouth every evening. 05/06/23   [provider]  ferrous sulfate  325 (65 FE) MG EC tablet Take 325 mg by mouth 2 (two) times daily with breakfast and lunch. 12/28/17   [provider]  folic acid  (FOLVITE ) 800 MCG tablet Take 800 mcg by mouth daily at 12 noon.    [provider]  Glucosamine-Chondroitin (OSTEO BI-FLEX REGULAR STRENGTH PO) Take 25 mcg by mouth daily at 12 noon. Plus D3    [provider]  HUMALOG KWIKPEN 100 UNIT/ML KwikPen Inject 2-6 Units into the skin See admin instructions. Per sliding scale 71-150=0 151-200=2 201-250=4 251-300=6 301-350=8 351-400=10 Check level in 2 hours and if no results call MD 12/25/22   [provider]  hydrALAZINE  (APRESOLINE ) 50 MG tablet TAKE 1 TABLET BY MOUTH EVERY SIX HOURS. 12/22/23   Krasowski, Robert J, MD  isosorbide  mononitrate (IMDUR ) 60 MG 24 hr tablet Take 60 mg by mouth daily.     [provider]  Lactobacillus (FLORAJEN ACIDOPHILUS) CAPS Take 1 capsule by mouth daily at 12 noon. 390 mg each    [provider]  levothyroxine  (SYNTHROID , LEVOTHROID) 50 MCG tablet Take 50 mcg by mouth daily before breakfast.    [provider]  metoprolol  succinate (TOPROL -XL) 50 MG 24 hr tablet Take 1.5 tablets (75 mg total) by mouth daily. Take with or immediately following a meal. 08/10/23 02/12/24  Krasowski, Robert J, MD  Multiple Vitamins-Minerals (PRESERVISION AREDS 2) CAPS Take 1 capsule by mouth 2 (two) times daily.    [provider]  nitrofurantoin , macrocrystal-monohydrate, (MACROBID ) 100 MG capsule Take 1 capsule (100 mg total) by mouth 2 (two) times daily. 02/12/24   Ival Domino, FNP  nitroGLYCERIN  (NITROSTAT ) 0.4 MG SL tablet Place 1 tablet (0.4 mg total) under the tongue every 5 (five) minutes as needed for chest pain. 08/10/23   Krasowski, Robert J, MD  Nutritional Supplements (GLUCERNA MEAL PO) Take 237 mLs by mouth daily.    [provider]  omega-3 acid ethyl esters (LOVAZA ) 1 g capsule TAKE 2 CAPSULES BY MOUTH DAILY. 06/03/23   Krasowski, Robert J, MD  pantoprazole  (PROTONIX ) 40 MG tablet Take 1 tablet (40 mg total) by mouth daily. 10/14/21   Krasowski, Robert J, MD  Polyethylene Glycol 3350  (MIRALAX  PO) Take 1 Capful by mouth daily as needed (Constipation).    [provider]  pravastatin  (PRAVACHOL ) 40 MG tablet Take 40 mg by mouth every other day.    [provider]  ranolazine  (RANEXA ) 500 MG 12 hr tablet Take 1 tablet (500 mg total) by mouth 2 (two) times daily. 01/17/24   Krasowski, Robert J, MD  sodium bicarbonate 650 MG tablet Take 650 mg by mouth. 01/25/24   [provider]  torsemide  (DEMADEX ) 20 MG tablet Take 20 mg by mouth 2 (two) times daily. 12/25/22   [provider]  vitamin B-12 (CYANOCOBALAMIN ) 1000 MCG tablet Take 1,000 mcg by mouth in the morning and at bedtime.    [provider]  Wheat Dextrin (BENEFIBER PO) Take 3 tablets by mouth daily.    [provider]    [facility]  History Family History  Problem Relation Age of Onset   Breast cancer Mother    Hypertension Father    Prostate cancer Father    Stroke Father    Diabetes Sister    Heart attack Sister    Heart attack Brother    Cancer Maternal Grandfather        not sure if it is colon or rectal    Heart attack Maternal Uncle    Rectal cancer Maternal Uncle    Stroke Maternal Uncle    Esophageal cancer Neg Hx    Pancreatic cancer Neg Hx    Stomach cancer Neg Hx     Social History Social History[1]   Allergies   Ciprofloxacin, Contrast media [iodinated contrast media], Crestor [rosuvastatin], Lipitor [atorvastatin], Nsaids, Statins, and Sulfa antibiotics   Review of Systems Review of Systems  Constitutional:  Negative for chills and fever.  HENT:  Negative for ear pain and sore throat.   Eyes:  Negative for pain and visual disturbance.  Respiratory:  Negative for cough and shortness of breath.   Cardiovascular:  Negative for chest pain and palpitations.  Gastrointestinal:  Negative for abdominal pain, constipation, diarrhea, nausea and vomiting.  Genitourinary:  Positive for frequency. Negative for dysuria and hematuria.  Musculoskeletal:  Negative for arthralgias and back pain.  Skin:  Negative for color change and rash.  Neurological:  Negative for seizures and syncope.  All other systems reviewed and are negative.    Physical Exam Triage Vital Signs ED Triage Vitals  Encounter Vitals Group     BP 02/18/24 1406 (!) 145/86     Girls Systolic BP Percentile --      Girls Diastolic BP Percentile --      Boys Systolic BP Percentile --      Boys Diastolic BP Percentile --      Pulse Rate 02/18/24 1406 (!) 124     Resp 02/18/24 1406 18     Temp 02/18/24 1406 97.6 F (36.4 C)     Temp Source 02/18/24 1406 Oral     SpO2 02/18/24 1406 96 %     Weight --      Height  --      Head Circumference --      Peak Flow --      Pain Score 02/18/24 1404 6     Pain Loc --      Pain Education --      Exclude from Growth Chart --    No data found.  Updated Vital Signs BP (!) 145/86 (BP Location: Right Arm)   Pulse (!) 124   Temp 97.6 F (36.4 C) (Oral)   Resp 18   SpO2 96%   Visual Acuity Right Eye Distance:   Left Eye Distance:   Bilateral Distance:    Right Eye Near:   Left Eye Near:    Bilateral Near:     Physical Exam Vitals and nursing note reviewed.  Constitutional:      General: She is not in acute distress.    Appearance: She is well-developed. She is not ill-appearing or toxic-appearing.  HENT:     Head: Normocephalic and atraumatic.     Right Ear: Hearing, tympanic membrane, ear canal and external ear normal.     Left Ear: Hearing, tympanic membrane, ear canal and external ear normal.     Nose: No congestion or rhinorrhea.     Right Sinus: No maxillary sinus tenderness or frontal sinus tenderness.  Left Sinus: No maxillary sinus tenderness or frontal sinus tenderness.     Mouth/Throat:     Lips: Pink.     Mouth: Mucous membranes are moist.     Pharynx: Uvula midline. No oropharyngeal exudate or posterior oropharyngeal erythema.     Tonsils: No tonsillar exudate.  Eyes:     Conjunctiva/sclera: Conjunctivae normal.     Pupils: Pupils are equal, round, and reactive to light.  Cardiovascular:     Rate and Rhythm: Normal rate and regular rhythm.     Heart sounds: S1 normal and S2 normal. No murmur heard. Pulmonary:     Effort: Pulmonary effort is normal. No respiratory distress.     Breath sounds: Normal breath sounds. No decreased breath sounds, wheezing, rhonchi or rales.  Abdominal:     General: Bowel sounds are normal.     Palpations: Abdomen is soft.     Tenderness: There is abdominal tenderness (Mild suprapubic pain.) in the suprapubic area. There is no right CVA tenderness, left CVA tenderness, guarding or rebound.  Negative signs include Murphy's sign, Rovsing's sign and McBurney's sign.  Musculoskeletal:        General: No swelling.     Cervical back: Neck supple.  Lymphadenopathy:     Head:     Right side of head: No submental, submandibular, tonsillar, preauricular or posterior auricular adenopathy.     Left side of head: No submental, submandibular, tonsillar, preauricular or posterior auricular adenopathy.     Cervical: No cervical adenopathy.     Right cervical: No superficial cervical adenopathy.    Left cervical: No superficial cervical adenopathy.  Skin:    General: Skin is warm and dry.     Capillary Refill: Capillary refill takes less than 2 seconds.     Findings: No rash.  Neurological:     Mental Status: She is alert and oriented to person, place, and time.  Psychiatric:        Mood and Affect: Mood normal.      UC Treatments / Results  Labs (all labs ordered are listed, but only abnormal results are displayed) Urine Culture: 02/12/24:     Component Ref Range & Units (hover) 6 d ago  Specimen Description URINE, CLEAN CATCH  Special Requests NONE Performed at [facility], [address], [place], [place] [address]  Culture >=100,000 COLONIES/mL ESCHERICHIA COLI Abnormal   Report Status 02/14/2024 FINAL  Organism ID, Bacteria ESCHERICHIA COLI Abnormal   Resulting Agency [facility]     Susceptibility   Escherichia coli    MIC    AMPICILLIN 8 SENSITIVE Sensitive    AMPICILLIN/SULBACTAM <=2 SENSITIVE Sensitive    CEFAZOLIN  (URINE)  Sensitive 1    CEFEPIME <=0.12 SENS... Sensitive    CEFTRIAXONE  <=0.25 SENS... Sensitive    CIPROFLOXACIN <=0.06 SENS... Sensitive    ERTAPENEM <=0.12 SENS... Sensitive    GENTAMICIN  <=1 SENSITIVE Sensitive    MEROPENEM <=0.25 SENS... Sensitive    NITROFURANTOIN  <=16 SENSIT... Sensitive    PIP/TAZO  Sensitive 2    TRIMETH/SULFA <=20 SENSIT... Sensitive        EKG   Radiology No results found.  Procedures Procedures  (including critical care time)  Medications Ordered in UC Medications - No data to display  Initial Impression / Assessment and Plan / UC Course  I have reviewed the triage vital signs and the nursing notes.  Pertinent labs & imaging results that were available during my care of the patient were reviewed by  me and considered in my medical decision making (see chart for details).  Plan of Care (see discharge instructions for additional patient precautions and education): Unresolved UTI with lower abdominal pain: Urinalysis showed nitrates and some white cells and rare red cells but there was not enough urine to actually get the test to process properly and result.  The nitrates and white cells were noted on the dipstick part of the test.  There was not enough urine to run a culture.  Patient could not void again.  Cephalexin  500 mg, 2 pills twice a day for 7 days (#28 pills).  Get plenty of fluids and rest  Encouraged to use D-Mannose pills (500 mg), 1-2 pills twice daily for UTI prevention.  This is an over-the-counter product and could be obtained from most drugstores or health food stores and online.  This should be used every day for prevention of UTI and is not effective if taken only as needed.   Encouraged to follow-up with primary care about the rectal prolapse and referral to a surgeon for that.  Follow-up here if needed.  Follow-up with urology about chronic UTIs.  I reviewed the plan of care with the patient and/or the patient's guardian.  The patient and/or guardian had time to ask questions and acknowledged that the questions were answered.  Final Clinical Impressions(s) / UC Diagnoses   Final diagnoses:  Urinary tract infection without hematuria, site unspecified  Suprapubic pain  Rectal prolapse     Discharge Instructions      Unresolved UTI with lower abdominal pain: Urinalysis showed nitrates and some white cells and rare red cells but there was not enough urine to  actually get the test to process properly and result.  The nitrates and white cells were noted on the dipstick part of the test.  There was not enough urine to run a culture.  Patient could not void again.  Cephalexin  500 mg, 2 pills twice a day for 7 days (#28 pills).  Get plenty of fluids and rest  Encouraged to use D-Mannose pills (500 mg), 1-2 pills twice daily for UTI prevention.  This is an over-the-counter product and could be obtained from most drugstores or health food stores and online.  This should be used every day for prevention of UTI and is not effective if taken only as needed.   Encouraged to follow-up with primary care about the rectal prolapse and referral to a surgeon for that.  Follow-up here if needed.  Follow-up with urology about chronic UTIs.     ED Prescriptions     Medication Sig Dispense Auth. Provider   cephALEXin  (KEFLEX ) 500 MG capsule Take 2 capsules (1,000 mg total) by mouth 2 (two) times daily for 7 days. 28 capsule Ival Domino, FNP      PDMP not reviewed this encounter.    [1]  Social History Tobacco Use   Smoking status: Former   Smokeless tobacco: Former    Quit date: 1997   Tobacco comments:    Former smoker 05/22/21  Vaping Use   Vaping status: Never Used  Substance Use Topics   Alcohol use: Yes    Alcohol/week: 2.0 standard drinks of alcohol    Types: 2 Standard drinks or equivalent per week    Comment: Occ   Drug use: No     Ival Domino, FNP 02/18/24 1434  "

## 2024-02-18 NOTE — Discharge Instructions (Addendum)
 Unresolved UTI with lower abdominal pain: Urinalysis showed nitrates and some white cells and rare red cells but there was not enough urine to actually get the test to process properly and result.  The nitrates and white cells were noted on the dipstick part of the test.  There was not enough urine to run a culture.  Patient could not void again.  Cephalexin  500 mg, 2 pills twice a day for 7 days (#28 pills).  Get plenty of fluids and rest  Encouraged to use D-Mannose pills (500 mg), 1-2 pills twice daily for UTI prevention.  This is an over-the-counter product and could be obtained from most drugstores or health food stores and online.  This should be used every day for prevention of UTI and is not effective if taken only as needed.   Encouraged to follow-up with primary care about the rectal prolapse and referral to a surgeon for that.  Follow-up here if needed.  Follow-up with urology about chronic UTIs.

## 2024-02-24 DIAGNOSIS — D696 Thrombocytopenia, unspecified: Secondary | ICD-10-CM | POA: Diagnosis not present

## 2024-02-24 DIAGNOSIS — I482 Chronic atrial fibrillation, unspecified: Secondary | ICD-10-CM | POA: Diagnosis not present

## 2024-02-24 DIAGNOSIS — I071 Rheumatic tricuspid insufficiency: Secondary | ICD-10-CM | POA: Diagnosis not present

## 2024-02-24 DIAGNOSIS — I2721 Secondary pulmonary arterial hypertension: Secondary | ICD-10-CM | POA: Diagnosis not present

## 2024-02-24 DIAGNOSIS — Z95818 Presence of other cardiac implants and grafts: Secondary | ICD-10-CM | POA: Diagnosis not present

## 2024-02-24 DIAGNOSIS — N189 Chronic kidney disease, unspecified: Secondary | ICD-10-CM | POA: Diagnosis not present

## 2024-02-24 DIAGNOSIS — I340 Nonrheumatic mitral (valve) insufficiency: Secondary | ICD-10-CM | POA: Diagnosis not present

## 2024-02-24 DIAGNOSIS — I1310 Hypertensive heart and chronic kidney disease without heart failure, with stage 1 through stage 4 chronic kidney disease, or unspecified chronic kidney disease: Secondary | ICD-10-CM | POA: Diagnosis not present

## 2024-02-25 DIAGNOSIS — D696 Thrombocytopenia, unspecified: Secondary | ICD-10-CM

## 2024-02-25 DIAGNOSIS — I340 Nonrheumatic mitral (valve) insufficiency: Secondary | ICD-10-CM | POA: Diagnosis not present

## 2024-02-25 DIAGNOSIS — Z95818 Presence of other cardiac implants and grafts: Secondary | ICD-10-CM | POA: Diagnosis not present

## 2024-02-25 DIAGNOSIS — I482 Chronic atrial fibrillation, unspecified: Secondary | ICD-10-CM | POA: Diagnosis not present

## 2024-02-25 DIAGNOSIS — I1310 Hypertensive heart and chronic kidney disease without heart failure, with stage 1 through stage 4 chronic kidney disease, or unspecified chronic kidney disease: Secondary | ICD-10-CM | POA: Diagnosis not present

## 2024-02-26 DIAGNOSIS — I482 Chronic atrial fibrillation, unspecified: Secondary | ICD-10-CM | POA: Diagnosis not present

## 2024-02-26 DIAGNOSIS — Z95818 Presence of other cardiac implants and grafts: Secondary | ICD-10-CM | POA: Diagnosis not present

## 2024-02-26 DIAGNOSIS — I1310 Hypertensive heart and chronic kidney disease without heart failure, with stage 1 through stage 4 chronic kidney disease, or unspecified chronic kidney disease: Secondary | ICD-10-CM | POA: Diagnosis not present

## 2024-02-26 DIAGNOSIS — I340 Nonrheumatic mitral (valve) insufficiency: Secondary | ICD-10-CM | POA: Diagnosis not present

## 2024-02-27 DIAGNOSIS — I482 Chronic atrial fibrillation, unspecified: Secondary | ICD-10-CM | POA: Diagnosis not present

## 2024-02-27 DIAGNOSIS — I1310 Hypertensive heart and chronic kidney disease without heart failure, with stage 1 through stage 4 chronic kidney disease, or unspecified chronic kidney disease: Secondary | ICD-10-CM | POA: Diagnosis not present

## 2024-02-27 DIAGNOSIS — I340 Nonrheumatic mitral (valve) insufficiency: Secondary | ICD-10-CM | POA: Diagnosis not present

## 2024-02-27 DIAGNOSIS — Z95818 Presence of other cardiac implants and grafts: Secondary | ICD-10-CM | POA: Diagnosis not present

## 2024-02-28 DIAGNOSIS — I482 Chronic atrial fibrillation, unspecified: Secondary | ICD-10-CM | POA: Diagnosis not present

## 2024-02-28 DIAGNOSIS — I1310 Hypertensive heart and chronic kidney disease without heart failure, with stage 1 through stage 4 chronic kidney disease, or unspecified chronic kidney disease: Secondary | ICD-10-CM | POA: Diagnosis not present

## 2024-02-28 DIAGNOSIS — I340 Nonrheumatic mitral (valve) insufficiency: Secondary | ICD-10-CM | POA: Diagnosis not present

## 2024-02-28 DIAGNOSIS — Z95818 Presence of other cardiac implants and grafts: Secondary | ICD-10-CM | POA: Diagnosis not present

## 2024-02-29 DIAGNOSIS — I482 Chronic atrial fibrillation, unspecified: Secondary | ICD-10-CM | POA: Diagnosis not present

## 2024-02-29 DIAGNOSIS — Z95818 Presence of other cardiac implants and grafts: Secondary | ICD-10-CM | POA: Diagnosis not present

## 2024-02-29 DIAGNOSIS — I1310 Hypertensive heart and chronic kidney disease without heart failure, with stage 1 through stage 4 chronic kidney disease, or unspecified chronic kidney disease: Secondary | ICD-10-CM | POA: Diagnosis not present

## 2024-02-29 DIAGNOSIS — I340 Nonrheumatic mitral (valve) insufficiency: Secondary | ICD-10-CM | POA: Diagnosis not present

## 2024-07-17 ENCOUNTER — Encounter

## 2024-10-16 ENCOUNTER — Encounter

## 2025-01-15 ENCOUNTER — Encounter

## 2025-04-16 ENCOUNTER — Encounter
